# Patient Record
Sex: Female | Born: 1940 | Race: White | Hispanic: No | Marital: Married | Smoking: Current every day smoker
Health system: Southern US, Community
[De-identification: ages and names within clinical notes are randomized; demographics above are authoritative.]

## PROBLEM LIST (undated history)

## (undated) DIAGNOSIS — R51 Headache: Secondary | ICD-10-CM

## (undated) DIAGNOSIS — E079 Disorder of thyroid, unspecified: Secondary | ICD-10-CM

## (undated) DIAGNOSIS — F329 Major depressive disorder, single episode, unspecified: Secondary | ICD-10-CM

## (undated) DIAGNOSIS — J45909 Unspecified asthma, uncomplicated: Secondary | ICD-10-CM

## (undated) DIAGNOSIS — K5730 Diverticulosis of large intestine without perforation or abscess without bleeding: Secondary | ICD-10-CM

## (undated) DIAGNOSIS — T7840XA Allergy, unspecified, initial encounter: Secondary | ICD-10-CM

## (undated) DIAGNOSIS — H269 Unspecified cataract: Secondary | ICD-10-CM

## (undated) DIAGNOSIS — E119 Type 2 diabetes mellitus without complications: Secondary | ICD-10-CM

## (undated) DIAGNOSIS — I10 Essential (primary) hypertension: Secondary | ICD-10-CM

## (undated) DIAGNOSIS — F419 Anxiety disorder, unspecified: Secondary | ICD-10-CM

## (undated) DIAGNOSIS — F32A Depression, unspecified: Secondary | ICD-10-CM

## (undated) DIAGNOSIS — M1990 Unspecified osteoarthritis, unspecified site: Secondary | ICD-10-CM

## (undated) DIAGNOSIS — E785 Hyperlipidemia, unspecified: Secondary | ICD-10-CM

## (undated) DIAGNOSIS — I729 Aneurysm of unspecified site: Secondary | ICD-10-CM

## (undated) DIAGNOSIS — J449 Chronic obstructive pulmonary disease, unspecified: Secondary | ICD-10-CM

## (undated) DIAGNOSIS — K219 Gastro-esophageal reflux disease without esophagitis: Secondary | ICD-10-CM

## (undated) DIAGNOSIS — E669 Obesity, unspecified: Secondary | ICD-10-CM

## (undated) DIAGNOSIS — J4489 Other specified chronic obstructive pulmonary disease: Secondary | ICD-10-CM

## (undated) DIAGNOSIS — R197 Diarrhea, unspecified: Secondary | ICD-10-CM

## (undated) DIAGNOSIS — Z860100 Personal history of colon polyps, unspecified: Secondary | ICD-10-CM

## (undated) DIAGNOSIS — Z800 Family history of malignant neoplasm of digestive organs: Secondary | ICD-10-CM

## (undated) DIAGNOSIS — K922 Gastrointestinal hemorrhage, unspecified: Secondary | ICD-10-CM

## (undated) DIAGNOSIS — I639 Cerebral infarction, unspecified: Secondary | ICD-10-CM

## (undated) DIAGNOSIS — F17200 Nicotine dependence, unspecified, uncomplicated: Secondary | ICD-10-CM

## (undated) DIAGNOSIS — G709 Myoneural disorder, unspecified: Secondary | ICD-10-CM

## (undated) DIAGNOSIS — D689 Coagulation defect, unspecified: Secondary | ICD-10-CM

## (undated) DIAGNOSIS — K222 Esophageal obstruction: Secondary | ICD-10-CM

## (undated) DIAGNOSIS — D649 Anemia, unspecified: Secondary | ICD-10-CM

## (undated) HISTORY — DX: Diverticulosis of large intestine without perforation or abscess without bleeding: K57.30

## (undated) HISTORY — DX: Gastrointestinal hemorrhage, unspecified: K92.2

## (undated) HISTORY — DX: Major depressive disorder, single episode, unspecified: F32.9

## (undated) HISTORY — DX: Aneurysm of unspecified site: I72.9

## (undated) HISTORY — DX: Essential (primary) hypertension: I10

## (undated) HISTORY — DX: Allergy, unspecified, initial encounter: T78.40XA

## (undated) HISTORY — DX: Coagulation defect, unspecified: D68.9

## (undated) HISTORY — DX: Diarrhea, unspecified: R19.7

## (undated) HISTORY — PX: TONSILLECTOMY: SUR1361

## (undated) HISTORY — DX: Hyperlipidemia, unspecified: E78.5

## (undated) HISTORY — PX: BACK SURGERY: SHX140

## (undated) HISTORY — DX: Unspecified cataract: H26.9

## (undated) HISTORY — DX: Unspecified asthma, uncomplicated: J45.909

## (undated) HISTORY — PX: BRAIN SURGERY: SHX531

## (undated) HISTORY — DX: Personal history of colon polyps, unspecified: Z86.0100

## (undated) HISTORY — PX: TOTAL ABDOMINAL HYSTERECTOMY: SHX209

## (undated) HISTORY — DX: Personal history of colonic polyps: Z86.010

## (undated) HISTORY — DX: Family history of malignant neoplasm of digestive organs: Z80.0

## (undated) HISTORY — DX: Chronic obstructive pulmonary disease, unspecified: J44.9

## (undated) HISTORY — DX: Esophageal obstruction: K22.2

## (undated) HISTORY — DX: Other specified chronic obstructive pulmonary disease: J44.89

## (undated) HISTORY — DX: Cerebral infarction, unspecified: I63.9

## (undated) HISTORY — PX: LUMBAR DISC SURGERY: SHX700

## (undated) HISTORY — PX: CARDIAC CATHETERIZATION: SHX172

## (undated) HISTORY — DX: Disorder of thyroid, unspecified: E07.9

## (undated) HISTORY — DX: Myoneural disorder, unspecified: G70.9

## (undated) HISTORY — DX: Depression, unspecified: F32.A

## (undated) HISTORY — PX: APPENDECTOMY: SHX54

## (undated) HISTORY — DX: Unspecified osteoarthritis, unspecified site: M19.90

## (undated) HISTORY — DX: Obesity, unspecified: E66.9

## (undated) HISTORY — PX: KNEE ARTHROSCOPY: SHX127

## (undated) HISTORY — DX: Gastro-esophageal reflux disease without esophagitis: K21.9

## (undated) HISTORY — DX: Type 2 diabetes mellitus without complications: E11.9

---

## 1998-05-25 ENCOUNTER — Ambulatory Visit (HOSPITAL_COMMUNITY): Admission: RE | Admit: 1998-05-25 | Discharge: 1998-05-25 | Payer: Self-pay | Admitting: Orthopedic Surgery

## 2000-08-25 ENCOUNTER — Ambulatory Visit (HOSPITAL_BASED_OUTPATIENT_CLINIC_OR_DEPARTMENT_OTHER): Admission: RE | Admit: 2000-08-25 | Discharge: 2000-08-25 | Payer: Self-pay | Admitting: Orthopedic Surgery

## 2001-11-17 ENCOUNTER — Encounter (INDEPENDENT_AMBULATORY_CARE_PROVIDER_SITE_OTHER): Payer: Self-pay | Admitting: Specialist

## 2001-11-17 ENCOUNTER — Encounter: Payer: Self-pay | Admitting: Gastroenterology

## 2001-11-17 ENCOUNTER — Ambulatory Visit (HOSPITAL_COMMUNITY): Admission: RE | Admit: 2001-11-17 | Discharge: 2001-11-17 | Payer: Self-pay | Admitting: Gastroenterology

## 2002-06-20 ENCOUNTER — Observation Stay (HOSPITAL_COMMUNITY): Admission: RE | Admit: 2002-06-20 | Discharge: 2002-06-21 | Payer: Self-pay | Admitting: Neurosurgery

## 2002-06-20 ENCOUNTER — Encounter: Payer: Self-pay | Admitting: Neurosurgery

## 2004-09-08 HISTORY — PX: CARDIAC CATHETERIZATION: SHX172

## 2004-12-23 ENCOUNTER — Ambulatory Visit: Payer: Self-pay | Admitting: Cardiology

## 2004-12-26 ENCOUNTER — Inpatient Hospital Stay (HOSPITAL_BASED_OUTPATIENT_CLINIC_OR_DEPARTMENT_OTHER): Admission: RE | Admit: 2004-12-26 | Discharge: 2004-12-26 | Payer: Self-pay | Admitting: Cardiology

## 2004-12-26 ENCOUNTER — Ambulatory Visit: Payer: Self-pay | Admitting: Cardiology

## 2005-01-02 ENCOUNTER — Ambulatory Visit: Payer: Self-pay | Admitting: Cardiology

## 2005-01-23 ENCOUNTER — Ambulatory Visit: Payer: Self-pay | Admitting: Cardiology

## 2005-11-17 ENCOUNTER — Ambulatory Visit: Payer: Self-pay | Admitting: Gastroenterology

## 2005-11-28 ENCOUNTER — Encounter: Payer: Self-pay | Admitting: Gastroenterology

## 2005-12-01 ENCOUNTER — Ambulatory Visit: Payer: Self-pay | Admitting: Gastroenterology

## 2006-03-31 ENCOUNTER — Ambulatory Visit (HOSPITAL_COMMUNITY): Admission: RE | Admit: 2006-03-31 | Discharge: 2006-03-31 | Payer: Self-pay | Admitting: Family Medicine

## 2006-04-19 ENCOUNTER — Encounter: Admission: RE | Admit: 2006-04-19 | Discharge: 2006-04-19 | Payer: Self-pay | Admitting: Orthopedic Surgery

## 2008-08-25 ENCOUNTER — Encounter: Payer: Self-pay | Admitting: Endocrinology

## 2008-08-28 DIAGNOSIS — I10 Essential (primary) hypertension: Secondary | ICD-10-CM

## 2008-08-28 DIAGNOSIS — J45909 Unspecified asthma, uncomplicated: Secondary | ICD-10-CM | POA: Insufficient documentation

## 2008-08-28 DIAGNOSIS — K449 Diaphragmatic hernia without obstruction or gangrene: Secondary | ICD-10-CM | POA: Insufficient documentation

## 2008-08-28 DIAGNOSIS — K222 Esophageal obstruction: Secondary | ICD-10-CM | POA: Insufficient documentation

## 2008-08-28 DIAGNOSIS — K5730 Diverticulosis of large intestine without perforation or abscess without bleeding: Secondary | ICD-10-CM | POA: Insufficient documentation

## 2008-08-28 DIAGNOSIS — E1159 Type 2 diabetes mellitus with other circulatory complications: Secondary | ICD-10-CM | POA: Insufficient documentation

## 2008-08-28 DIAGNOSIS — Z860100 Personal history of colon polyps, unspecified: Secondary | ICD-10-CM | POA: Insufficient documentation

## 2008-08-28 DIAGNOSIS — K219 Gastro-esophageal reflux disease without esophagitis: Secondary | ICD-10-CM | POA: Insufficient documentation

## 2008-08-29 ENCOUNTER — Ambulatory Visit: Payer: Self-pay | Admitting: Gastroenterology

## 2008-08-29 DIAGNOSIS — R197 Diarrhea, unspecified: Secondary | ICD-10-CM | POA: Insufficient documentation

## 2008-08-29 DIAGNOSIS — E669 Obesity, unspecified: Secondary | ICD-10-CM | POA: Insufficient documentation

## 2008-08-29 DIAGNOSIS — J449 Chronic obstructive pulmonary disease, unspecified: Secondary | ICD-10-CM | POA: Insufficient documentation

## 2008-08-29 DIAGNOSIS — E785 Hyperlipidemia, unspecified: Secondary | ICD-10-CM | POA: Insufficient documentation

## 2008-08-29 DIAGNOSIS — E119 Type 2 diabetes mellitus without complications: Secondary | ICD-10-CM | POA: Insufficient documentation

## 2008-08-29 DIAGNOSIS — R634 Abnormal weight loss: Secondary | ICD-10-CM | POA: Insufficient documentation

## 2008-08-29 LAB — CONVERTED CEMR LAB
ALT: 20 units/L (ref 0–35)
AST: 21 units/L (ref 0–37)
Albumin: 3.9 g/dL (ref 3.5–5.2)
Alkaline Phosphatase: 74 units/L (ref 39–117)
Amylase: 23 units/L — ABNORMAL LOW (ref 27–131)
BUN: 5 mg/dL — ABNORMAL LOW (ref 6–23)
Basophils Absolute: 0.1 10*3/uL (ref 0.0–0.1)
Basophils Relative: 0.9 % (ref 0.0–3.0)
Bilirubin, Direct: 0.1 mg/dL (ref 0.0–0.3)
CO2: 27 meq/L (ref 19–32)
Calcium: 9.9 mg/dL (ref 8.4–10.5)
Chloride: 104 meq/L (ref 96–112)
Creatinine, Ser: 0.7 mg/dL (ref 0.4–1.2)
Eosinophils Absolute: 0.2 10*3/uL (ref 0.0–0.7)
Eosinophils Relative: 2.5 % (ref 0.0–5.0)
GFR calc Af Amer: 108 mL/min
GFR calc non Af Amer: 89 mL/min
Glucose, Bld: 97 mg/dL (ref 70–99)
HCT: 42.1 % (ref 36.0–46.0)
Hemoglobin: 14.4 g/dL (ref 12.0–15.0)
Lipase: 19 units/L (ref 11.0–59.0)
Lymphocytes Relative: 28.9 % (ref 12.0–46.0)
MCHC: 34.2 g/dL (ref 30.0–36.0)
MCV: 87.3 fL (ref 78.0–100.0)
Monocytes Absolute: 0.6 10*3/uL (ref 0.1–1.0)
Monocytes Relative: 10 % (ref 3.0–12.0)
Neutro Abs: 3.4 10*3/uL (ref 1.4–7.7)
Neutrophils Relative %: 57.7 % (ref 43.0–77.0)
Platelets: 267 10*3/uL (ref 150–400)
Potassium: 3.8 meq/L (ref 3.5–5.1)
RBC: 4.82 M/uL (ref 3.87–5.11)
RDW: 12.4 % (ref 11.5–14.6)
Sed Rate: 17 mm/hr (ref 0–22)
Sodium: 139 meq/L (ref 135–145)
TSH: 0.02 microintl units/mL — ABNORMAL LOW (ref 0.35–5.50)
Tissue Transglutaminase Ab, IgA: 0 units (ref <7)
Total Bilirubin: 0.7 mg/dL (ref 0.3–1.2)
Total Protein: 6.7 g/dL (ref 6.0–8.3)
WBC: 6.1 10*3/uL (ref 4.5–10.5)

## 2008-08-30 LAB — CONVERTED CEMR LAB
ALT: 20 units/L (ref 0–35)
AST: 21 units/L (ref 0–37)
Albumin: 3.9 g/dL (ref 3.5–5.2)
Alkaline Phosphatase: 74 units/L (ref 39–117)
Amylase: 23 units/L — ABNORMAL LOW (ref 27–131)
BUN: 5 mg/dL — ABNORMAL LOW (ref 6–23)
Basophils Absolute: 0.1 10*3/uL (ref 0.0–0.1)
Basophils Relative: 0.9 % (ref 0.0–3.0)
Bilirubin, Direct: 0.1 mg/dL (ref 0.0–0.3)
CO2: 27 meq/L (ref 19–32)
Calcium: 9.9 mg/dL (ref 8.4–10.5)
Chloride: 104 meq/L (ref 96–112)
Creatinine, Ser: 0.7 mg/dL (ref 0.4–1.2)
Eosinophils Absolute: 0.2 10*3/uL (ref 0.0–0.7)
Eosinophils Relative: 2.5 % (ref 0.0–5.0)
GFR calc Af Amer: 108 mL/min
GFR calc non Af Amer: 89 mL/min
Glucose, Bld: 97 mg/dL (ref 70–99)
HCT: 42.1 % (ref 36.0–46.0)
Hemoglobin: 14.4 g/dL (ref 12.0–15.0)
Lipase: 19 units/L (ref 11.0–59.0)
Lymphocytes Relative: 28.9 % (ref 12.0–46.0)
MCHC: 34.2 g/dL (ref 30.0–36.0)
MCV: 87.3 fL (ref 78.0–100.0)
Monocytes Absolute: 0.6 10*3/uL (ref 0.1–1.0)
Monocytes Relative: 10 % (ref 3.0–12.0)
Neutro Abs: 3.4 10*3/uL (ref 1.4–7.7)
Neutrophils Relative %: 57.7 % (ref 43.0–77.0)
Platelets: 267 10*3/uL (ref 150–400)
Potassium: 3.8 meq/L (ref 3.5–5.1)
RBC: 4.82 M/uL (ref 3.87–5.11)
RDW: 12.4 % (ref 11.5–14.6)
Sed Rate: 17 mm/hr (ref 0–22)
Sodium: 139 meq/L (ref 135–145)
T3 Uptake Ratio: 48.7 % — ABNORMAL HIGH (ref 22.5–37.0)
T3, Free: 5.5 pg/mL — ABNORMAL HIGH (ref 2.3–4.2)
T4, Total: 18 ug/dL — ABNORMAL HIGH (ref 5.0–12.5)
TSH: 0.02 microintl units/mL — ABNORMAL LOW (ref 0.35–5.50)
Total Bilirubin: 0.7 mg/dL (ref 0.3–1.2)
Total Protein: 6.7 g/dL (ref 6.0–8.3)
WBC: 6.1 10*3/uL (ref 4.5–10.5)

## 2008-09-20 ENCOUNTER — Encounter: Payer: Self-pay | Admitting: Gastroenterology

## 2008-09-20 ENCOUNTER — Ambulatory Visit: Payer: Self-pay | Admitting: Gastroenterology

## 2008-09-22 ENCOUNTER — Encounter: Payer: Self-pay | Admitting: Gastroenterology

## 2008-10-12 ENCOUNTER — Ambulatory Visit: Payer: Self-pay | Admitting: Gastroenterology

## 2008-10-12 DIAGNOSIS — E079 Disorder of thyroid, unspecified: Secondary | ICD-10-CM | POA: Insufficient documentation

## 2008-10-26 ENCOUNTER — Ambulatory Visit: Payer: Self-pay | Admitting: Endocrinology

## 2008-10-26 LAB — CONVERTED CEMR LAB
Free T4: 1.2 ng/dL (ref 0.6–1.6)
TSH: 0.05 microintl units/mL — ABNORMAL LOW (ref 0.35–5.50)

## 2008-10-30 ENCOUNTER — Encounter: Payer: Self-pay | Admitting: Endocrinology

## 2008-11-07 ENCOUNTER — Encounter (HOSPITAL_COMMUNITY): Admission: RE | Admit: 2008-11-07 | Discharge: 2009-01-12 | Payer: Self-pay | Admitting: Gastroenterology

## 2008-11-08 ENCOUNTER — Encounter: Payer: Self-pay | Admitting: Endocrinology

## 2008-12-12 ENCOUNTER — Encounter: Payer: Self-pay | Admitting: Endocrinology

## 2008-12-21 ENCOUNTER — Ambulatory Visit: Payer: Self-pay | Admitting: Endocrinology

## 2008-12-27 ENCOUNTER — Encounter (HOSPITAL_COMMUNITY): Admission: RE | Admit: 2008-12-27 | Discharge: 2009-01-26 | Payer: Self-pay | Admitting: Endocrinology

## 2009-01-30 ENCOUNTER — Telehealth (INDEPENDENT_AMBULATORY_CARE_PROVIDER_SITE_OTHER): Payer: Self-pay | Admitting: *Deleted

## 2009-04-29 ENCOUNTER — Emergency Department (HOSPITAL_COMMUNITY): Admission: EM | Admit: 2009-04-29 | Discharge: 2009-04-29 | Payer: Self-pay | Admitting: Emergency Medicine

## 2010-04-11 ENCOUNTER — Encounter: Admission: RE | Admit: 2010-04-11 | Discharge: 2010-04-11 | Payer: Self-pay | Admitting: Family Medicine

## 2010-09-29 ENCOUNTER — Encounter: Payer: Self-pay | Admitting: Family Medicine

## 2010-09-29 ENCOUNTER — Encounter: Payer: Self-pay | Admitting: Endocrinology

## 2010-09-30 ENCOUNTER — Encounter: Payer: Self-pay | Admitting: Endocrinology

## 2010-10-08 NOTE — Letter (Signed)
Summary: Patient Notice- Colon Biospy Results  [facility]  [address] [place], [place] [address]   Phone: [phone]  Fax: [phone]        September 22, 2008 MRN: [id number]    [facility] [address] [place], [place]  [address]    Dear [name],  I am pleased to inform you that the biopsies taken during your recent colonoscopy did not show any evidence of cancer upon pathologic examination.  Additional information/recommendations:  __No further action is needed at this time.  Please follow-up with      your primary care physician for your other healthcare needs.  XX__Please call [phone] to schedule a return visit to review      your condition.  __Continue with the treatment plan as outlined on the day of your      exam.  __You should have a repeat colonoscopy examination for this problem           in _ years.  Please call us if you are having persistent problems or have questions about your condition that have not been fully answered at this time.  Sincerely,  Elizabeth Layman MD [facility]  This letter has been electronically signed by your physician.

## 2010-10-08 NOTE — Assessment & Plan Note (Signed)
Summary: FU  $50   STC   Vital Signs:  Patient profile:   70 year old female Height:      62 inches Weight:      168 pounds Temp:     96.8 degrees F oral Pulse rate:   93 / minute BP standing:   148 / 78  (right arm) Cuff size:   regular  Vitals Entered By: Bill Salinas CMA (December 21, 2008 8:01 AM)  Referring Provider:  DR Christell Constant Primary Provider:  Rudi Heap, MD   History of Present Illness: pt has persistent hyperthyroidism with decreased uptake on nuc med study.  pt denies recent iodine contrast.  she feels well except for a 3-lb weight gain.  Current Medications (verified): 1)  Janumet 50-1000 Mg Tabs (Sitagliptin-Metformin Hcl) .Marland Kitchen.. 1 Tablet By Mouth Once Daily 2)  Ditropan Xl 15 Mg Xr24h-Tab (Oxybutynin Chloride) .Marland Kitchen.. 1 Tablet By Mouth Once Daily 3)  Amlodipine Besylate 5 Mg Tabs (Amlodipine Besylate) .Marland Kitchen.. 1 Tablet By Mouth Once Daily 4)  Ramipril 10 Mg Caps (Ramipril) .Marland Kitchen.. 1 Tablet By Mouth Once Daily 5)  Urelle 81 Mg Tabs (Meth-Hyo-M Bl-Na Phos-Ph Sal) .Marland Kitchen.. 1 Tablet By Mouth 4 Times Per Day 6)  Omeprazole 20 Mg Cpdr (Omeprazole) .Marland Kitchen.. 1 Tablet By Mouth Once Daily 7)  Lexapro 10 Mg Tabs (Escitalopram Oxalate) .Marland Kitchen.. 1 Tablet By Mouth Once Daily 8)  Crestor 10 Mg Tabs (Rosuvastatin Calcium) .Marland Kitchen.. 1 Tablet By Mouth Once Daily 9)  Xanax 0.25 Mg Tabs (Alprazolam) .Marland Kitchen.. 1 Tablet By Mouth Once Daily 10)  Singulair 10 Mg Tabs (Montelukast Sodium) .Marland Kitchen.. 1 Tablet By Mouth Once Daily 11)  Benadryl Dye-Free Allergy 25 Mg Caps (Diphenhydramine Hcl) .... As Needed 12)  Klor-Con M20 20 Meq Cr-Tabs (Potassium Chloride Crys Cr) .... Take 1 By Mouth Qd  Allergies (verified): 1)  ! Codeine 2)  ! Penicillin 3)  ! Sulfa 4)  ! * Latex  Past History:  Past Medical History:    DIARRHEA (ICD-787.91)    OBESITY, UNSPECIFIED (ICD-278.00)    HYPERLIPIDEMIA (ICD-272.4)    DM (ICD-250.00)    COPD (ICD-496)    ASTHMA (ICD-493.90)    HYPERTENSION (ICD-401.9)    CARCINOMA, COLON, FAMILY HX  (ICD-V16.0)    GERD (ICD-530.81)    HIATAL HERNIA (ICD-553.3)    ESOPHAGEAL STRICTURE (ICD-530.3)    DIVERTICULOSIS, COLON (ICD-562.10)    COLONIC POLYPS, HX OF (ICD-V12.72)      (10/26/2008)  Review of Systems  The patient denies fever.    Physical Exam  General:  obese.  no distress Neck:  thyroid is enlarged, right > left, with ? of multiple nodules Additional Exam:  outside test results are reviewed:  12/12/08: tsh=undetectable fti=3.8  THYROID SCAN AND UPTAKE - 24 HOURS Comparison: 11/08/2008 No focal thyroid abnormalities. Decreased 24 hour radioiodine uptake of 2.4%. With the patient demonstrating hyperthyroidism, this most likely represents subacute thyroiditis. 24 hour uptake has decreased slightly since the prior study, when it measured 3.3%.   Impression & Recommendations:  Problem # 1:  HYPERTHYROIDISM (ICD-242.90) with persistently low uptake on nuc med study  Other Orders: Radiology Referral (Radiology) Est. Patient Level III (32440)  Patient Instructions: 1)  i advised pt to come in for repeat tsh, free t4, and thyroglobulin (10272),  all 242.9

## 2010-10-08 NOTE — Assessment & Plan Note (Signed)
Summary: pain after meals and diarrhea././em   History of Present Illness Visit Type: new patient Primary GI MD: Sheryn Bison MD FACP FAGA Primary Provider: Rudi Heap, MD Chief Complaint: GERD, diarrhea History of Present Illness:   Elizabeth [name] has had 6 months of explosive diarrhea managed personally by taking Imodium after severe diarrhea and soiling clothing. She denies any dietary changes but has lost 70 pounds because she was diagnosed with diabetes and eats very little during the day.She Denies use of sorbitol or fructose or any history of lactose intolerance. She'll occasionally have some lower abdominal cramping after a bowel movement but denies significant abdominal pain otherwise. She denies any specific food intolerances.  6 months ago she was diagnosed with diabetes and well self imposed diet has lost 70 pounds. She has a previous history of idiopathic gastroparesis has been on Reglan 10 mg 4 times a day which she dropped back to twice a day after she read the [facility] Box warning on Reglan from the FDA. Patient does work in [employer] office.  It seems to patient that the more she eats more stools she has. She describes her stools as floating and a large volume but without heme. She does not have nocturnal diarrhea or a systemic complaints such as fever, chills, skin rashes, or arthritis. She denies use of NSAIDs or other anti-inflammatories. She denies any upper GI complaints or hepatobiliary problems. She does have well water but there no sick family members at home. In reviewing her chart she has not had this problem in the past. Her family is of [place] descent. She does have a family history of colon cancer and her last colonoscopy was 2 years ago. She takes daily omeprazole 20 mg for acid reflux. She is on oral diabetic medications but not insulin.   GI Review of Systems   Weight loss of 70 pounds   Denies abdominal pain, acid reflux, belching, bloating, chest pain, dysphagia  with liquids, dysphagia with solids, heartburn, loss of appetite, nausea, vomiting, vomiting blood, weight loss, and  weight gain.      Reports diarrhea and  fecal incontinence.     Denies anal fissure, black tarry stools, change in bowel habit, constipation, diverticulosis, heme positive stool, hemorrhoids, irritable bowel syndrome, jaundice, light color stool, liver problems, rectal bleeding, and  rectal pain.     Prior Medications Reviewed Using: Patient Recall  Updated Prior Medication List: JANUMET 50-1000 MG TABS (SITAGLIPTIN-METFORMIN HCL) 1 tablet by mouth once daily DITROPAN XL 15 MG XR24H-TAB (OXYBUTYNIN CHLORIDE) 1 tablet by mouth once daily AMLODIPINE BESYLATE 5 MG TABS (AMLODIPINE BESYLATE) 1 tablet by mouth once daily RAMIPRIL 10 MG CAPS (RAMIPRIL) 1 tablet by mouth once daily URELLE 81 MG TABS (METH-HYO-M BL-NA PHOS-PH SAL) 1 tablet by mouth 4 times per day REGLAN 10 MG TABS (METOCLOPRAMIDE HCL) 1 tablet by mouth two times a day OMEPRAZOLE 20 MG CPDR (OMEPRAZOLE) 1 tablet by mouth once daily LEXAPRO 10 MG TABS (ESCITALOPRAM OXALATE) 1 tablet by mouth once daily CRESTOR 10 MG TABS (ROSUVASTATIN CALCIUM) 1 tablet by mouth once daily XANAX 0.25 MG TABS (ALPRAZOLAM) 1 tablet by mouth once daily SINGULAIR 10 MG TABS (MONTELUKAST SODIUM) 1 tablet by mouth once daily BENADRYL DYE-FREE ALLERGY 25 MG CAPS (DIPHENHYDRAMINE HCL) as needed  Current Allergies (reviewed today): ! CODEINE ! PENICILLIN ! SULFA ! * LATEX  Past Medical History:    Reviewed history from 08/28/2008 and no changes required:       Current Problems:  DIARRHEA (ICD-787.91)       OBESITY, UNSPECIFIED (ICD-278.00)       HYPERLIPIDEMIA (ICD-272.4)       DM (ICD-250.00)       COPD (ICD-496)       ASTHMA (ICD-493.90)       HYPERTENSION (ICD-401.9)       CARCINOMA, COLON, FAMILY HX (ICD-V16.0)       GERD (ICD-530.81)       HIATAL HERNIA (ICD-553.3)       ESOPHAGEAL STRICTURE (ICD-530.3)        DIVERTICULOSIS, COLON (ICD-562.10)       COLONIC POLYPS, HX OF (ICD-V12.72)          Past Surgical History:    Reviewed history from 08/28/2008 and no changes required:       Hysterectomy complete       Tonsillectomy       Lumbar surgery       Left Arthoscopy knee       Appendectomy   Family History:    Reviewed history from 08/28/2008 and no changes required:       Family History of Colon Cancer:first cousin and paternal aunt and uncle       Family History of Gastric Cancer: Paternal Grandmother       Family History of Breast Cancer: Maternal Aunt, several paternal cousins       Family History of Diabetes: Mother       Family History of Heart Disease: Mother, maternal grandmother       Thyroid cancer: Aunt  Social History:    Reviewed history and no changes required:       Married       Occupation: RN       Patient currently smokes.        Alcohol Use - no       Daily Caffeine Use       Illicit Drug Use - no       Patient gets regular exercise.   Risk Factors:  Tobacco use:  current    Year started:  1984    Cigarettes:  Yes Drug use:  no Caffeine use:  2 drinks per day Alcohol use:  no Exercise:  yes    Type:  Walk   Review of Systems       The patient complains of allergy/sinus and depression-new.  The patient denies anemia, anxiety-new, arthritis/joint pain, back pain, blood in urine, breast changes/lumps, change in vision, confusion, cough, coughing up blood, fainting, fatigue, fever, headaches-new, hearing problems, heart murmur, heart rhythm changes, itching, menstrual pain, muscle pains/cramps, night sweats, nosebleeds, pregnancy symptoms, shortness of breath, skin rash, sleeping problems, sore throat, swelling of feet/legs, swollen lymph glands, thirst - excessive , urination - excessive , urination changes/pain, urine leakage, vision changes, and voice change.     Vital Signs:  Patient Profile:   70 Years Old Female Height:     62 inches Weight:       171.50 pounds BMI:     31.48 BSA:     1.79 Pulse rate:   96 / minute Pulse rhythm:   regular BP sitting:   164 / 68  (left arm) Cuff size:   regular  Vitals Entered By: Teresita Madura CMA (August 29, 2008 9:01 AM)                  Physical Exam  General:     Well developed, well nourished, no acute distress.healthy appearing.  Head:     Normocephalic and atraumatic. Eyes:     PERRLA, no icterus.exam deferred to patient's ophthalmologist.   Neck:     Supple; no masses or thyromegaly. Lungs:     Clear throughout to auscultation. Heart:     Regular rate and rhythm; no murmurs, rubs,  or bruits. Abdomen:     Soft, nontender and nondistended. No masses, hepatosplenomegaly or hernias noted. Normal bowel sounds. Rectal:     Normal exam.hemocult negative.   Extremities:     No clubbing, cyanosis, edema or deformities noted. Neurologic:     Alert and  oriented x4;  grossly normal neurologically. Psych:     Alert and cooperative. Normal mood and affect.    Impression & Recommendations:  Problem # 1:  DIARRHEA (ICD-787.91) Assessment: Deteriorated This is a new problem for this patient it does sound like she has possible malabsorption. Have ordered screening laboratory parameters and stool exams. Have also set her for followup colonoscopy and obtain random biopsies to exclude microscopic-collagenous colitis. Her diabetes is under good control with her last hemoglobin A1c at 5.1. She has no symptoms of peripheral neuropathy, renal disease, or visual problems related to her diabetes. That would make diabetic gut diarrhea problems unlikely. She is on Reglan which have asked her to stop it she will continue omeprazole. It certainly does not sound like she is having gastroparesis problems at this time since she is following such a limited diet. She denies anorexia but I wonder if this is not also an underlying problem and may need further evaluation also. Orders: TLB-CBC  Platelet - w/Differential (85025-CBCD) TLB-BMP (Basic Metabolic Panel-BMET) (80048-METABOL) TLB-Hepatic/Liver Function Pnl (80076-HEPATIC) TLB-TSH (Thyroid Stimulating Hormone) (84443-TSH) TLB-Amylase (82150-AMYL) TLB-Lipase (83690-LIPASE) TLB-Sedimentation Rate (ESR) (85652-ESR) T-Sprue Panel (Celiac Disease Aby Eval) [phone]) T-Stool Fats [place] Stain [phone]) T-Stool Giardia / Crypto- EIA (56213) T-Stool for O&P (08657-84696) T-Fecal WBC (29528-41324) Colonoscopy (Colon)   Problem # 2:  OBESITY, UNSPECIFIED (ICD-278.00) Assessment: Improved  Problem # 3:  DM (ICD-250.00) Assessment: Improved continue current medications per Dr. Christell Constant  Problem # 4:  HYPERTENSION (ICD-401.9) Assessment: Improved  Problem # 5:  CARCINOMA, COLON, FAMILY HX (ICD-V16.0) Assessment: Unchanged colonoscopy followup scheduled.  Problem # 6:  GERD (ICD-530.81) Assessment: Improved continue daily omeprazole 20 mg   Patient Instructions: 1)  Copy Sent To:Dr. Rudi Heap in [facility] 2)  Please Continue current medications. 3)  Labs and stool exams ordered 4)  Colonoscopy scheduled with random colon biopsies, scheduled for 09-20-2008.  5)  Stop Reglan therapy. 6)  Colonoscopy and Flexible Sigmoidoscopy brochure given. 7)  Conscious Sedation brochure given. 8)  [facility] Patient Information Guide given to patient.    Prescriptions: MOVIPREP 100 GM  SOLR (PEG-KCL-NACL-NASULF-NA ASC-C) As per prep instructions.  #1 x 0   Entered by:   Harlow Mares CMA   Authorized by:   Mardella Layman MD FACG,FAGA   Signed by:   Harlow Mares CMA on 08/29/2008   Method used:   Electronically to        [facility] # [phone]* (retail)       [address]       [place], [place]  [address]       Ph: [phone] or [phone]       Fax: [phone]   RxID:   [id number]  ]

## 2010-10-08 NOTE — Assessment & Plan Note (Signed)
Summary: NEW ENDO-SEVERE HYPERTHYROIDISM $50 PER TRACY/DR MOORE-[facility]ICA...   Vital Signs:  Patient Profile:   70 Years Old Female Height:     62 inches Weight:      165.0 pounds O2 Sat:      96 % O2 treatment:    Room Air Temp:     98.3 degrees F oral Pulse rate:   93 / minute BP sitting:   160 / 72  (left arm) Cuff size:   regular  Pt. in pain?   no  Vitals Entered By: Orlan Leavens (October 26, 2008 9:59 AM)                  Referred by:  DR Christell Constant PCP:  Rudi Heap, MD  Chief Complaint:  NEW ENDO.  History of Present Illness: 6 mos of diarrhea.  eval of this found hyperthyroidism. she says she was never noted before to have a thyroid condition.  she has no associated abd pain.  diarrhea is resolved, but has lost 85 lbs x 14 mos    Prior Medications Reviewed Using: List Brought by Patient  Updated Prior Medication List: JANUMET 50-1000 MG TABS (SITAGLIPTIN-METFORMIN HCL) 1 tablet by mouth once daily DITROPAN XL 15 MG XR24H-TAB (OXYBUTYNIN CHLORIDE) 1 tablet by mouth once daily AMLODIPINE BESYLATE 5 MG TABS (AMLODIPINE BESYLATE) 1 tablet by mouth once daily RAMIPRIL 10 MG CAPS (RAMIPRIL) 1 tablet by mouth once daily URELLE 81 MG TABS (METH-HYO-M BL-NA PHOS-PH SAL) 1 tablet by mouth 4 times per day OMEPRAZOLE 20 MG CPDR (OMEPRAZOLE) 1 tablet by mouth once daily LEXAPRO 10 MG TABS (ESCITALOPRAM OXALATE) 1 tablet by mouth once daily CRESTOR 10 MG TABS (ROSUVASTATIN CALCIUM) 1 tablet by mouth once daily XANAX 0.25 MG TABS (ALPRAZOLAM) 1 tablet by mouth once daily SINGULAIR 10 MG TABS (MONTELUKAST SODIUM) 1 tablet by mouth once daily BENADRYL DYE-FREE ALLERGY 25 MG CAPS (DIPHENHYDRAMINE HCL) as needed KLOR-CON M20 20 MEQ CR-TABS (POTASSIUM CHLORIDE CRYS CR) TAKE 1 by mouth QD  Current Allergies (reviewed today): ! CODEINE ! PENICILLIN ! SULFA ! * LATEX  Past Medical History:    Reviewed history from 08/29/2008 and no changes required:       DIARRHEA (ICD-787.91)     OBESITY, UNSPECIFIED (ICD-278.00)       HYPERLIPIDEMIA (ICD-272.4)       DM (ICD-250.00)       COPD (ICD-496)       ASTHMA (ICD-493.90)       HYPERTENSION (ICD-401.9)       CARCINOMA, COLON, FAMILY HX (ICD-V16.0)       GERD (ICD-530.81)       HIATAL HERNIA (ICD-553.3)       ESOPHAGEAL STRICTURE (ICD-530.3)       DIVERTICULOSIS, COLON (ICD-562.10)       COLONIC POLYPS, HX OF (ICD-V12.72)           Family History:    Reviewed history from 08/29/2008 and no changes required:       Family History of Colon Cancer:first cousin and paternal aunt and uncle       Family History of Gastric Cancer: Paternal Grandmother       Family History of Breast Cancer: Maternal Aunt, several paternal cousins       Family History of Diabetes: Mother       Family History of Heart Disease: Mother, maternal grandmother       Thyroid cancer: Aunt       sister has uncertain typr of thyroid problem (  not cancer)  Social History:    Reviewed history from 10/12/2008 and no changes required:       Married       Occupation: Charity fundraiser, [employer]       Patient currently smokes.        Alcohol Use - no       Daily Caffeine Use       Illicit Drug Use - no       Patient gets regular exercise.    Review of Systems  The patient denies fever and syncope.         denies double vision, headache, palpitations, sob, brbpr, myalgias, excessive diaphoresis, numbness, tremor, anxiety, hypoglycemia, bruising.  she attributes rhinorrhea to current uri.  she has urinary incontinence.    Physical Exam  General:     obese.   Head:     head: no deformity eyes: no periorbital swelling, no proptosis external nose and ears are normal mouth: no lesion seen  Neck:     thyroid is enlarged, right > left, with ? of multiple nodules Lungs:     clear bilaterally to A  Heart:     regular rate and rhythm, S1, S2 without murmurs, rubs, gallops, or clicks Abdomen:     abdomen is soft, nontender.  no hepatosplenomegaly.    not distended.  no hernia  Msk:     no deformity or scoliosis noted with normal posture and gait Pulses:     dorsalis pedis intact bilat.  no carotid bruit  Extremities:     no deformity.  no ulcer on the feet.  feet are of normal color and temp.  no edema  Neurologic:     cn 2-12 grossly intact.   readily moves all 4's.   sensation is intact to touch on the feet no tremor Skin:     normal texture and temp.  no rash.  not diaphoretic  Cervical Nodes:     no significant adenopathy Psych:     alert and cooperative; normal mood and affect; normal attention span and concentration Additional Exam:     today; FREE T4                   1.2 ng/dL                   1.6-1.0 FastTSH              [L]  0.05 uIU/mL     Impression & Recommendations:  Problem # 1:  HYPERTHYROIDISM (ICD-242.90) could be due to grave's disease, multinodular goiter, or a combination of the 2  Problem # 2:  WEIGHT LOSS (ICD-783.21) severe, prob due to #1  Problem # 3:  ASTHMA (ICD-493.90) this increases the risk of metoprolol  Medications Added to Medication List This Visit: 1)  Klor-con M20 20 Meq Cr-tabs (Potassium chloride crys cr) .... Take 1 by mouth qd   Patient Instructions: 1)  cc dr Annabell Howells, [place], [facility] 2)  we discussed the differential diagnosis, risks, and rx options of hyperthyroidism 3)  scan, with plan for i-131 rx 4)  lopressor is declined 5)  ret 6 weeks after i-131 rx

## 2010-10-08 NOTE — Progress Notes (Signed)
Summary: labs  Phone Note Call from Patient Call back at Home Phone [phone]   Caller: [phone] Call For: dr ellsion  Summary of Call:  per [facility] on 12-21-2008 was told to come back and have a  tsh, free t4, and thyroglobulin (56213),  all 242.9...Marland KitchenMarland KitchenConnelly Spruell states her car broke down  want to know if she can do this at Dr Christell Constant office in [place]  Initial call taken by: Shelbie Proctor,  Jan 30, 2009 4:07 PM  Follow-up for Phone Call        ok if it is ok with him Follow-up by: Minus Breeding MD,  Jan 30, 2009 4:34 PM  Additional Follow-up for Phone Call Additional follow up Details #1::        called pt to inform cheryl enter labs in the system Additional Follow-up by: Shelbie Proctor,  Jan 30, 2009 4:42 PM

## 2010-10-08 NOTE — Assessment & Plan Note (Signed)
Summary: RECEIVED A LETTER TO F-UP/YF   History of Present Illness Visit Type: follow up Primary GI MD: Sheryn Bison MD FACP FAGA Primary Provider: Rudi Heap, MD Requesting Provider: n/a Chief Complaint: follow-up visit from Colonoscopy on 1/13 History of Present Illness:   Elizabeth [name] is doing well and  is no longer having diarrhea since we have stopped her Reglan. Her colonoscopy with multiple colon biopsies did not show microscopic or collagenous colitis. She was found be hyperthyroidism and has an appointment with Dr. Everardo All next week. She really is doing extremely well denies GI complaints this time. She continues to involuntarily lose weight which I think is related to her thyrotoxicosis.   GI Review of Systems      Denies abdominal pain, acid reflux, belching, bloating, chest pain, dysphagia with liquids, dysphagia with solids, heartburn, loss of appetite, nausea, vomiting, vomiting blood, weight loss, and  weight gain.        Denies anal fissure, black tarry stools, change in bowel habit, constipation, diarrhea, diverticulosis, fecal incontinence, heme positive stool, hemorrhoids, irritable bowel syndrome, jaundice, light color stool, liver problems, rectal bleeding, and  rectal pain.   Prior Medications Reviewed Using: Patient Recall  Prior Medication List:  JANUMET 50-1000 MG TABS (SITAGLIPTIN-METFORMIN HCL) 1 tablet by mouth once daily DITROPAN XL 15 MG XR24H-TAB (OXYBUTYNIN CHLORIDE) 1 tablet by mouth once daily AMLODIPINE BESYLATE 5 MG TABS (AMLODIPINE BESYLATE) 1 tablet by mouth once daily RAMIPRIL 10 MG CAPS (RAMIPRIL) 1 tablet by mouth once daily URELLE 81 MG TABS (METH-HYO-M BL-NA PHOS-PH SAL) 1 tablet by mouth 4 times per day OMEPRAZOLE 20 MG CPDR (OMEPRAZOLE) 1 tablet by mouth once daily LEXAPRO 10 MG TABS (ESCITALOPRAM OXALATE) 1 tablet by mouth once daily CRESTOR 10 MG TABS (ROSUVASTATIN CALCIUM) 1 tablet by mouth once daily XANAX 0.25 MG TABS (ALPRAZOLAM) 1  tablet by mouth once daily SINGULAIR 10 MG TABS (MONTELUKAST SODIUM) 1 tablet by mouth once daily BENADRYL DYE-FREE ALLERGY 25 MG CAPS (DIPHENHYDRAMINE HCL) as needed   Current Allergies (reviewed today): ! CODEINE ! PENICILLIN ! SULFA ! * LATEX Past Medical History:    Reviewed history from 08/29/2008 and no changes required:       Current Problems:        DIARRHEA (ICD-787.91)       OBESITY, UNSPECIFIED (ICD-278.00)       HYPERLIPIDEMIA (ICD-272.4)       DM (ICD-250.00)       COPD (ICD-496)       ASTHMA (ICD-493.90)       HYPERTENSION (ICD-401.9)       CARCINOMA, COLON, FAMILY HX (ICD-V16.0)       GERD (ICD-530.81)       HIATAL HERNIA (ICD-553.3)       ESOPHAGEAL STRICTURE (ICD-530.3)       DIVERTICULOSIS, COLON (ICD-562.10)       COLONIC POLYPS, HX OF (ICD-V12.72)          Past Surgical History:    Reviewed history from 08/29/2008 and no changes required:       Hysterectomy complete       Tonsillectomy       Lumbar surgery       Left Arthoscopy knee       Appendectomy   Family History:    Reviewed history from 08/29/2008 and no changes required:       Family History of Colon Cancer:first cousin and paternal aunt and uncle       Family History of Gastric Cancer:  Paternal Grandmother       Family History of Breast Cancer: Maternal Aunt, several paternal cousins       Family History of Diabetes: Mother       Family History of Heart Disease: Mother, maternal grandmother       Thyroid cancer: Aunt  Social History:    Reviewed history from 08/29/2008 and no changes required:       Married       Occupation: Charity fundraiser, [employer] Waverly Ferrari       Patient currently smokes.        Alcohol Use - no       Daily Caffeine Use       Illicit Drug Use - no       Patient gets regular exercise.  Vital Signs:  Patient Profile:   70 Years Old Female Height:     62 inches Weight:      164.38 pounds BMI:     30.17 BSA:     1.76 Pulse rate:   88 / minute Pulse rhythm:   regular BP  sitting:   124 / 72  (left arm)  Vitals Entered By: Merri Ray CMA (October 12, 2008 10:18 AM)                  Physical Exam  General:     Well developed, well nourished, no acute distress.healthy appearing.     Impression & Recommendations:  Problem # 1:  DIARRHEA (ICD-787.91) Assessment: Improved Improved with continued avoidance of Reglan  Problem # 2:  GERD (ICD-530.81) Assessment: Improved continued daily PPI use.  Problem # 3:  DM (ICD-250.00) Assessment: Improved continued management  by Dr. Christell Constant  Problem # 4:  HYPERTHYROIDISM (ICD-242.90) Assessment: Unchanged endocrinology consult pending   Patient Instructions: 1)  Please Continue current medications. 2)  Copy Sent To:Dr. Rudi Heap in [facility]. and Dr. Everardo All in endocrinology 3)  Please schedule a follow-up appointment as needed.

## 2010-10-08 NOTE — Procedures (Signed)
Summary: EGD    EGD  Procedure date:  11/17/2001  Findings:      Location: [facility]    Patient Name: Elizabeth [name], Elizabeth [name] MRN: [id number] Procedure Procedures: Panendoscopy (EGD) CPT: 43235.    with biopsy(s)/brushing(s). CPT: D1846139.    with esophageal dilation. CPT: G9296129.  Personnel: Endoscopist: Vania Rea. Jarold Motto, MD.  Exam Location: Exam performed in Endoscopy Suite.  Patient Consent: Procedure, Alternatives, Risks and Benefits discussed, consent obtained,  Indications Symptoms: Dysphagia. Reflux symptoms  History  Pre-Exam Physical: Performed Nov 17, 2001  Cardio-pulmonary exam, Abdominal exam, Extremity exam, Mental status exam WNL.  Exam Exam Info: Maximum depth of insertion Duodenum, intended Duodenum. Patient position: on left side. Duration of exam: 15 minutes. Gastric retroflexion performed. Images taken. ASA Classification: I. Tolerance: excellent.  Sedation Meds: Cetacaine Spray 2 sprays Fentanyl 75 mcg. Versed 6.5 mg.  Monitoring: BP and pulse monitoring done. Oximetry used. Supplemental O2 given  Fluoroscopy: Fluoroscopy was not used.  Instrument(s): PCF 140L.   Findings - STRICTURE / STENOSIS: Distal Esophagus.  Constriction: partial. Etiology: benign due to reflux. Lumen diameter is 14 mm. ICD9: Esophageal Stricture: 530.3.  - Dilation: Distal Esophagus. for esophageal stricture. Maloney dilator used, Diameter: 18 mm, No Resistance, No Heme present on extraction. 1  total dilators used. Patient tolerance excellent. Outcome: successful.  - HIATAL HERNIA: Prolapsing, 5 cms. in length. ICD9: Hernia, Hiatal: 553.3.Comments: Free reflux noted.  - DIAGNOSTIC TEST: from Antr[facility]. RUT done, results pending Reason: R/O H. pylori after recent Rx.   Assessment  Diagnoses: 530.3: Esophageal Stricture.  553.3: Hernia, Hiatal. GERD.   Events  Unplanned Intervention: No unplanned interventions were required.  Plans Medication(s): PPI:  Pantoprazole/Protonix 40 mg BID, starting Nov 17, 2001 for 4 wks.   Patient Education: Patient given standard instructions for: Reflux.  Disposition: After procedure patient sent to recovery.  Scheduling: Clinic Visit, to Vania Rea. Jarold Motto, MD, around Dec 18, 2001.    This report was created from the original endoscopy report, which was reviewed and signed by the above listed endoscopist.

## 2010-10-08 NOTE — Procedures (Signed)
Summary: Colon and Pathology   Colonoscopy  Procedure date:  12/01/2005  Findings:      Location:  [facility].    Procedures Next Due Date:    Colonoscopy: 12/2008  Patient Name: Elizabeth [name], Elizabeth [name] MRN: [id number] Procedure Procedures: Colonoscopy CPT: 19147.  Personnel: Endoscopist: Vania Rea. Jarold Motto, MD.  Exam Location: Exam performed in Outpatient Clinic. Outpatient  Patient Consent: Procedure, Alternatives, Risks and Benefits discussed, consent obtained, from patient. Consent was obtained by the RN.  Indications  Surveillance of: Adenomatous Polyp(s).  History  Current Medications: Patient is not currently taking Coumadin.  Pre-Exam Physical: Performed Dec 01, 2005. Cardio-pulmonary exam, Rectal exam, Abdominal exam, Extremity exam, Mental status exam WNL.  Comments: Pt. history reviewed/updated, physical exam performed prior to initiation of sedation? Exam Exam: Extent of exam reached: Cecum, extent intended: Cecum.  The cecum was identified by appendiceal orifice and IC valve. Patient position: on left side. Duration of exam: 20 minutes. Colon retroflexion performed. Images taken. ASA Classification: II. Tolerance: excellent.  Monitoring: Pulse and BP monitoring, Oximetry used. Supplemental O2 given. at 2 Liters.  Colon Prep Used Golytely for colon prep. Prep results: good.  Sedation Meds: Patient assessed and found to be appropriate for moderate (conscious) sedation. Fentanyl 75 mcg. given IV. Versed 7 mg. given IV.  Instrument(s): CF 140L. Serial [id number].  Findings - POLYP: Transverse Colon, Maximum size: 3 mm. diminutive, sessile polyp. Procedure:  snare with cautery, removed, retrieved, Polyp sent to pathology. ICD9: Colon Polyps: 211.3.  - DIVERTICULOSIS: Descending Colon to Sigmoid Colon. Not bleeding. ICD9: Diverticulosis, Colon: 562.10.  - NORMAL EXAM: Cecum to Rectum. Not Seen: Polyps. AVM's. Colitis. Tumors. Melanosis.  Crohn's. Hemorrhoids.   Assessment  Diagnoses: 211.3: Colon Polyps.  562.10: Diverticulosis, Colon.   Events  Unplanned Interventions: No intervention was required.  Plans  Post Exam Instructions: No aspirin or non-steroidal containing medications: 2 weeks.  Medication Plan: Await pathology. Referring provider to order medications.  Patient Education: Patient given standard instructions for: Polyps. Diverticulosis. Patient instructed to get routine colonoscopy every 5 years.  Disposition: After procedure patient sent to recovery. After recovery patient sent home.  Scheduling/Referral: Follow-Up prn.    CC: Rudi Heap, MD  This report was created from the original endoscopy report, which was reviewed and signed by the above listed endoscopist.

## 2010-10-08 NOTE — Procedures (Signed)
Summary: Colonoscopy   Colonoscopy  Procedure date:  09/20/2008  Findings:      Location:  [facility].     COLONOSCOPY PROCEDURE REPORT  PATIENT:  Elizabeth [name], Elizabeth [name]  MR#:  [id number] BIRTHDATE:   14-Oct-1940   GENDER:   female  ENDOSCOPIST:   Vania Rea. Jarold Motto, MD, [facility] Referred by: Rudi Heap, M.D.  PROCEDURE DATE:  09/20/2008 PROCEDURE:  Colonoscopy with biopsy ASA CLASS:   Class II INDICATIONS: unexplained diarrhea   MEDICATIONS:    Fentanyl 75 mcg IV, Versed 7 mg IV  DESCRIPTION OF PROCEDURE:   After the risks benefits and alternatives of the procedure were thoroughly explained, informed consent was obtained.  Digital rectal exam was performed and revealed no abnormalities.   The LB [id number] endoscope was introduced through the anus and advanced to the cecum, which was identified by both the appendix and ileocecal valve, without limitations.  The quality of the prep was good, using MoviPrep.  The instrument was then slowly withdrawn as the colon was fully examined. <<PROCEDUREIMAGES>>  <<OLD IMAGES>>  FINDINGS:  Internal hemorrhoids were found.  This was otherwise a normal examination. MULTIPLE BIOPSIES EVERY 10 CM. DONE FOR EXAM.   PERIANAL PAPILLAE NOTED.  The scope was then withdrawn from the patient and the procedure completed.  COMPLICATIONS:   None  ENDOSCOPIC IMPRESSION:  1) Internal hemorrhoids  2) Otherwise normal examination  R/O MICROSCOPIC/COLLAGENOUS COLITIS VS ALL FROM HYPERTHYROIDISM VS SEVERE IBS. RECOMMENDATIONS:  1) await biopsy results  ENDOCRINE CONSULT PENDING. SHE MAY NEED LOTRONEX RX.  REPEAT EXAM:   No   _______________________________ Vania Rea. Jarold Motto, MD, [facility]  CC:         REPORT OF SURGICAL PATHOLOGY   Case #: [id number] Patient Name: Elizabeth [name], Elizabeth [name] Office Chart Number:  N/A   MRN: [id number] Pathologist: Beulah Gandy. Luisa Hart, MD DOB/Age  15-Oct-1940 (Age: 70)    Gender: F Date Taken:  09/20/2008 Date  Received: 09/21/2008   FINAL DIAGNOSIS   ***MICROSCOPIC EXAMINATION AND DIAGNOSIS***   COLON, RANDOM BIOPSIES:  BENIGN COLONIC MUCOSA.  NO ACTIVE INFLAMMATION, MICROSCOPIC COLITIS OR GRANULOMAS.    COMMENT There is colorectal mucosa with normal crypt architecture and no objective increase in inflammation.  No active inflammation, microscopic colitis, collagenous colitis or significant chronic changes identified. No hyperplastic or adenomatous changes are seen, and there is no evidence of malignancy. (JDP:mj 09/22/08)   mj Date Reported:  09/22/2008     Beulah Gandy. Luisa Hart, MD *** Electronically Signed Out By JDP ***      September 22, 2008 MRN: [id number]    [facility] [address] [place], [place]  [address]    Dear Elizabeth [name],  I am pleased to inform you that the biopsies taken during your recent colonoscopy did not show any evidence of cancer upon pathologic examination.  Additional information/recommendations:  __No further action is needed at this time.  Please follow-up with      your primary care physician for your other healthcare needs.  XX__Please call [phone] to schedule a return visit to review      your condition.  __Continue with the treatment plan as outlined on the day of your      exam.  __You should have a repeat colonoscopy examination for this problem           in _ years.  Please call us if you are having persistent problems or have questions about your condition that have not been fully answered  at this time.  Sincerely,  Mardella Layman MD [facility]  This letter has been electronically signed by your physician.    This report was created from the original endoscopy report, which was reviewed and signed by the above listed endoscopist.

## 2010-10-16 ENCOUNTER — Other Ambulatory Visit: Payer: Self-pay | Admitting: Family Medicine

## 2010-10-16 DIAGNOSIS — R234 Changes in skin texture: Secondary | ICD-10-CM

## 2010-10-24 ENCOUNTER — Ambulatory Visit
Admission: RE | Admit: 2010-10-24 | Discharge: 2010-10-24 | Disposition: A | Discharge status: Home / Self Care | Payer: Medicare Other | Source: Ambulatory Visit | Attending: Family Medicine | Admitting: Family Medicine

## 2010-10-24 DIAGNOSIS — R234 Changes in skin texture: Secondary | ICD-10-CM

## 2010-12-20 ENCOUNTER — Ambulatory Visit (INDEPENDENT_AMBULATORY_CARE_PROVIDER_SITE_OTHER): Payer: Medicare Other | Admitting: Internal Medicine

## 2010-12-20 ENCOUNTER — Encounter: Payer: Self-pay | Admitting: Internal Medicine

## 2010-12-20 VITALS — BP 118/70 | HR 71 | Ht 62.0 in | Wt 186.8 lb

## 2010-12-20 DIAGNOSIS — J449 Chronic obstructive pulmonary disease, unspecified: Secondary | ICD-10-CM

## 2010-12-20 DIAGNOSIS — K449 Diaphragmatic hernia without obstruction or gangrene: Secondary | ICD-10-CM

## 2010-12-20 DIAGNOSIS — J4489 Other specified chronic obstructive pulmonary disease: Secondary | ICD-10-CM

## 2010-12-20 NOTE — Patient Instructions (Signed)
I really strongly encourage you to stay off the cigarettes forever.  Try replacing the ACE inhibitor ramipril/ Altace, with samples of Benicar /HCT 20/12.5, once daily. It can take a month for the ACE effect on cough to go away, if it does turn out to be a factor.   Continue to do every thing you can to minimize reflux and heart burn.  Sample Dulera 200-5      2 puffs and rinse mouth well, twice daily. It helps to just leave the sample on the bathroom sink and use it before you brush teeth.

## 2010-12-20 NOTE — Progress Notes (Signed)
  Subjective:    Patient ID: Elizabeth [name], female    DOB: 1941-06-19, 70 y.o.   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 She quit smoking 3 days ago. She complains of cough which she says began 6 weeks ago as "allergy".She got better initially after mucinex, Z pak then Avelox. Cough has now been worse again in last week. Still mostly dry. It wakes her and she has to sit up.New night sweats. Sinus congestion helped by chlortimeton, Some postnasal drip, but no purulence and no headache. Hoarseness new this week. Short of breath only after hard coughing fits. No pain. Main c/o is that she is tired/ exhausted from all the coughing- but she wasn't considering that sedating antihistamine may increase this. She is now finishing a prednisone taper.  Denies hx penumonia. Had a PFT 10 years ago and told then she had some COPD. Given singulair then for "asthma". Does give hx of GERD/ HH and seasonal allergic rhinitis.  Banana causes anaphyllaxis. May get chest tight from potatoes. Allergy skin testing in past- no vaccine.    Review of Systems See HPI Constitutional:   No weight loss, night sweats,  Fevers, chills,  HEENT:   No headaches,  Difficulty swallowing,  Tooth/dental problems,  Sore throat,                No sneezing, itching, ear ache, nasal congestion, post nasal drip,   CV:  No chest pain,  Orthopnea, PND, swelling in lower extremities, anasarca, dizziness, palpitations  GI , abdominal pain, nausea, vomiting, diarrhea, change in bowel habits, loss of appetite  Resp:.  No excess mucus,  No coughing up of blood.  No change in color of mucus.  No wheezing.  Skin: no rash or lesions.  GU: no dysuria, change in color of urine, no urgency or frequency.  No flank pain.  MS:  No joint pain or swelling.  No decreased range of motion.  No back pain.  Psych:  No change in mood or affect. No depression or anxiety.  No memory loss.      Objective:   Physical  Exam General- Alert, Oriented, Affect-appropriate, Distress- none acute  overweight  Skin- rash-none, lesions- none, excoriation- none  Lymphadenopathy- none  Head- atraumatic  Eyes- Gross vision intact, PERRLA, conjunctivae clear, secretions  Ears- Hearing, canals, Tm L ,   R ,  Nose- Clear, Septal dev, mucus, polyps, erosion, perforation   Throat- Mallampati II , mucosa clear , drainage- none, tonsils- atrophic.  Voice husky  Neck- flexible , trachea midline, no stridor , thyroid nl, carotid no bruit  Chest - symmetrical excursion , unlabored     Heart/CV- RRR , no murmur , no gallop  , no rub, nl s1 s2                     - JVD- none , edema- none, stasis changes- none, varices- none     Lung- Diminished, unlabored, wheeze- none, cough- none , dullness-none, rub- none.   Few crackles in bases.     Chest wall- atraumatic, no scar  Abd- tender-no, distended-no, bowel sounds-present, HSM- no  Br/ Gen/ Rectal- Not done, not indicated  Extrem- cyanosis- none, clubbing, none, atrophy- none, strength- nl  Neuro- grossly intact to observation         Assessment & Plan:

## 2010-12-23 LAB — GLUCOSE, CAPILLARY
Glucose-Capillary: 81 mg/dL (ref 70–99)
Glucose-Capillary: 88 mg/dL (ref 70–99)

## 2010-12-26 ENCOUNTER — Encounter: Payer: Self-pay | Admitting: Internal Medicine

## 2010-12-26 NOTE — Assessment & Plan Note (Signed)
We have discussed potential role of reflux in maintaining cough, especially cyclical cough. She has elevated head of bed.

## 2010-12-26 NOTE — Assessment & Plan Note (Addendum)
Cough is consistent with bronchitis. Her ACE inhibitor and her hx of GERD may be contributing. We will try her off Altace, giving a few weeks trial of Benicar HCT samples. Continued smoking cessation is crucial and reinforced. Sample [facility] for higher inhaled steroid exposure.  She is intolerant of codeine and hydrocodone with nausea and vomiting.  I am supporting her staying out of work till cough settles down- she feels bending and moving around much would be disruptive.

## 2011-01-01 ENCOUNTER — Encounter: Payer: Self-pay | Admitting: Internal Medicine

## 2011-01-24 NOTE — Op Note (Signed)
NAME:  Elizabeth [name], Elizabeth [name]                            ACCOUNT NO.:  [id number]   MEDICAL RECORD NO.:  [id number]                   PATIENT TYPE:  OIB   LOCATION:  NA                                   FACILITY:  [facility]   PHYSICIAN:  Danae Orleans. Venetia Maxon, M.D.               DATE OF BIRTH:  [birth year]   DATE OF PROCEDURE:  06/20/2002  DATE OF DISCHARGE:                                 OPERATIVE REPORT   PREOPERATIVE DIAGNOSIS:  Herniated lumbar disk, L5-S1 right, with  spondylosis, degenerative disk disease, and radiculopathy.   POSTOPERATIVE DIAGNOSIS:  Herniated lumbar disk, L5-S1 right, with  spondylosis, degenerative disk disease, and radiculopathy.   PROCEDURE:  Hemisemilaminotomy, L5-S1 right, with microdiskectomy and  microdissection.   SURGEON:  Danae Orleans. Venetia Maxon, M.D.   ASSISTANT:  __________.   ANESTHESIA:  General endotracheal anesthesia.   ESTIMATED BLOOD LOSS:  Less than 50 cc.   COMPLICATIONS:  None.   DISPOSITION:  To recovery.   INDICATIONS:  The patient is a 70 year old woman with right lower extremity  pain with a large central to right-sided disk herniation at L5-S1.  It was  elected to take her to surgery for microdiskectomy at L5-S1 on the right.   DESCRIPTION OF PROCEDURE:  The patient was brought to the operating room.  Following the satisfactory and uncomplicated induction of general anesthesia  and placement of intravenous lines, she was placed in a prone position on  the [employer].  Care was taken to pad all soft tissue and bony  prominences.  Her lower back was then prepped and draped in the usual  sterile fashion.  The area of planned incision was infiltrated with 0.25%  Marcaine and 0.5% lidocaine and 1:200,000 epinephrine.  An incision was made  in the midline and carried through approximately three inches of adipose  tissue to the lumbodorsal fascia, which was incised on the right side of  midline.  Subperiosteal dissection was performed, exposing the  L5-S1  interspace.  An intraoperative x-ray confirmed correct orientation.  Hemisemilaminotomy of L5 was then performed using the Anspach drill with an  M8 equivalent bur.  A laminotomy was then completed over the superior aspect  of the S1 lamina.  The ligamentum flavum was then detached and removed in  piecemeal fashion.  The lateral recess was also decompressed.  The  microscope was then brought into the field and using microdissection  technique, the S1 nerve root and common dural tube were mobilized.  There  was a large medial central to the right paracentral disk herniation, which  was compressing the common dural tube and S1 nerve root.  This was covered  by a thin covering of annulus.  The D'Errico nerve root retractor was used  to facilitate exposure.  The annulus was then incised with a 15 blade and  disk material was removed in a piecemeal fashion using a variety  of  pituitary rongeurs and the interspace was evacuated of residual disk  material.  The [employer] downgoing curettes were used to remove  hypertrophied annulus and remaining central disk material.  The interspace  was then evacuated of residual disk material and the residual hypertrophied  annulus was removed with Kerrison rongeurs.  Using a coronary dilator, the  floor of the canal was palpated and found to be well-decompressed, as was  the S1 nerve root.  Hemostasis was assured with Gelfoam soaked in thrombin.  The self-retaining retractor was then removed.  The microscope was taken out  of the field.  The lumbodorsal fascia was closed with 0 Vicryl suture, the  suture was reapproximated with 0 Vicryl and 2-0 Vicryl interrupted inverted  sutures, and the skin edge was reapproximated with 3-0 Vicryl subcuticular  stitch.  The wound was dressed with Dermabond.  The patient was extubated in  the operating room and taken to the recovery room in stable and satisfactory  condition, having tolerated her operation  well.  All counts were correct at  the end of the case.                                               Danae Orleans. Venetia Maxon, M.D.    JDS/MEDQ  D:  06/20/2002  T:  06/21/2002  Job:  [id number]

## 2011-01-24 NOTE — Cardiovascular Report (Signed)
NAMERIDHIMA, GOLBERG NO.:  [id number]   MEDICAL RECORD NO.:  [id number]          PATIENT TYPE:  OIB   LOCATION:  6501                         FACILITY:  [facility]   PHYSICIAN:  Jonelle Sidle, M.D. LHCDATE OF BIRTH:  04/16/1941   DATE OF PROCEDURE:  12/26/2004  DATE OF DISCHARGE:  12/26/2004                              CARDIAC CATHETERIZATION   INDICATIONS:  Ms. Stetler is a pleasant 70 year old woman with a history of  hypertension, dyslipidemia, type 2 diabetes mellitus, tobacco use, and  asthma. She has had recent problems with mild dyspnea and lower extremity  edema. Given her cardiac risk factor profile, she is referred for diagnostic  coronary angiography and left ventriculography to assess coronary anatomy  and left ventricular function.   PROCEDURE:  1.  Left heart catheterization.  2.  Selective coronary angiography.  3.  Left ventriculography.   DESCRIPTION OF PROCEDURE:  The area about the right femoral artery was  anesthetized 1% lidocaine and a 4-French sheath was placed in the right  femoral artery via modified Seldinger technique. Standard preformed 4-French  JL-4 and JR-4 catheters used for selective coronary angiography and angled  pigtail catheters used for left heart catheterization and left  ventriculography. All exchanges were made over wire. The patient tolerated  procedure well without immediate complications.   HEMODYNAMIC RESULTS:  1.  Left ventricle 151/25 mmHg.  2.  Aorta 152/76 mmHg.   ANGIOGRAPHIC FINDINGS:  1.  Left main coronary artery is free of significant flow-limiting coronary      atherosclerosis.  2.  The left anterior descending is a medium caliber vessel with three small      diagonal branches. There are minor Luminal irregularities noted      throughout this system with approximately 30% diffuse mid-vessel      stenosis. There is also a 30-40% ostial stenosis involving the first      diagonal branch. No  flow-limiting stenoses are noted.  3.  The circumflex coronary artery is a medium caliber vessel. There is a      very small branch in the AV groove and two larger bifurcating obtuse      marginal branches. Minor Luminal irregularities are noted without flow-      limiting stenoses.  4.  The right coronary artery is a medium caliber vessel with posterior      descending branch. Minor Luminal irregularities are noted. There is a      20% stenosis in the proximal to mid-vessel.   LEFT VENTRICULOGRAPHY:  Left ventriculography was performed in the RAO  projection revealing an ejection fraction approximately 65% in the setting  of ventricular ectopy without significant mitral regurgitation or focal wall  motion abnormality.   DIAGNOSES:  1.  Mild coronary atherosclerosis as outlined with no flow-limiting stenoses      in the major epicardial vessels.  2.  Left ventricular ejection fraction of approximately 65% in the setting      of ventricular ectopy. No significant mitral regurgitation is noted. The      left ventricular end-diastolic pressure is  increased at 25 mmHg. At this      point would suspect an element of diastolic dysfunction particularly      given elevated left renal end-diastolic pressure.      The patient is already on an ACE inhibitor and diuretic. We will plan to      continue maximizing medical therapy. We will arrange a follow-up 2-D      echocardiogram for diastolic parameters and also to exclude      significantly elevated pulmonary pressures. She will have follow-up in      the office to review this and her progress.      SGM/MEDQ  D:  12/26/2004  T:  12/27/2004  Job:  [id number]   cc:   Learta Codding, M.D. [facility]   Ernestina Penna, M.D.  [address] [place]  [place] [address]  Fax: [phone]

## 2011-01-24 NOTE — Op Note (Signed)
[place]. [facility]  Patient:    Elizabeth [name], Elizabeth [name]                         MRN: [id number] Proc. Date: 08/25/00 Adm. Date:  [id number] Attending:  Ronne Binning                           Operative Report  PREOPERATIVE DIAGNOSIS:  Carpal tunnel syndrome, right hand.  POSTOPERATIVE DIAGNOSIS:  Carpal tunnel syndrome, right hand.  PROCEDURE:  Decompression right median nerve.  SURGEON:  Nicki Reaper, M.D.  ASSISTANT:  Joaquin Courts, R.N.  ANESTHESIA:  Forearm-based IV regional.  ANESTHESIOLOGIST:  Cliffton Asters. Ivin Booty, M.D.  HISTORY:  The patient is a 70 year old female with a history of carpal tunnel syndrome, EMG nerve conductions positive, which has not responded to conservative treatment.  DESCRIPTION OF PROCEDURE:  The patient was brought to the operating room, where a forearm-based IV regional anesthetic was carried out without difficulty.  She was prepped and draped using Duraprep, Latex precautions were used throughout the procedure.  A longitudinal incision was made in the forearm, carried down through the subcutaneous tissue, bleeders electrocauterized.  The palmar fascia was split, superficial palmar arch identified.  The flexor tendon of the ring and little finger identified to the ulnar side of the median nerve.  The carpal retinaculum was incised with sharp dissection.  A right angle and Sewell retractor was placed between the skin and forearm fascia.  The fascia was released for approximately 3 cm proximal to the wrist crease under direct vision.  Canal was explored, and no further lesions were identified.  Tenosynovial tissue was moderately thickened, and the wound was irrigated.  The skin was closed with interrupted 5-0 nylon suture.  Sterile compressive dressing and splint was applied.  The patient tolerated the procedure well and was taken to the recovery room for observation in satisfactory condition.  She is discharged home, to return  to the [facility] in one week, on Talwin NX and Septra DS. DD:  08/25/00 TD:  08/26/00 Job: [id number]

## 2011-01-30 ENCOUNTER — Ambulatory Visit: Payer: Medicare Other | Admitting: Internal Medicine

## 2011-03-10 ENCOUNTER — Ambulatory Visit: Payer: Medicare Other | Admitting: Internal Medicine

## 2011-04-08 ENCOUNTER — Other Ambulatory Visit: Payer: Self-pay | Admitting: Family Medicine

## 2011-04-08 ENCOUNTER — Ambulatory Visit (HOSPITAL_COMMUNITY)
Admission: RE | Admit: 2011-04-08 | Discharge: 2011-04-08 | Disposition: A | Discharge status: Home / Self Care | Payer: Medicare Other | Source: Ambulatory Visit | Attending: Family Medicine | Admitting: Family Medicine

## 2011-04-08 DIAGNOSIS — I10 Essential (primary) hypertension: Secondary | ICD-10-CM | POA: Insufficient documentation

## 2011-04-08 DIAGNOSIS — K5730 Diverticulosis of large intestine without perforation or abscess without bleeding: Secondary | ICD-10-CM | POA: Insufficient documentation

## 2011-04-08 DIAGNOSIS — E119 Type 2 diabetes mellitus without complications: Secondary | ICD-10-CM | POA: Insufficient documentation

## 2011-04-08 DIAGNOSIS — R109 Unspecified abdominal pain: Secondary | ICD-10-CM

## 2011-04-08 DIAGNOSIS — D3500 Benign neoplasm of unspecified adrenal gland: Secondary | ICD-10-CM | POA: Insufficient documentation

## 2011-04-08 DIAGNOSIS — R1031 Right lower quadrant pain: Secondary | ICD-10-CM | POA: Insufficient documentation

## 2011-04-08 DIAGNOSIS — Q619 Cystic kidney disease, unspecified: Secondary | ICD-10-CM | POA: Insufficient documentation

## 2011-04-08 MED ORDER — IOHEXOL 300 MG/ML  SOLN
100.0000 mL | Freq: Once | INTRAMUSCULAR | Status: AC | PRN
  Administered 2011-04-08: 100 mL via INTRAVENOUS

## 2011-04-11 ENCOUNTER — Other Ambulatory Visit: Payer: Self-pay | Admitting: Family Medicine

## 2011-04-11 DIAGNOSIS — R1030 Lower abdominal pain, unspecified: Secondary | ICD-10-CM

## 2011-04-11 DIAGNOSIS — R109 Unspecified abdominal pain: Secondary | ICD-10-CM

## 2011-04-19 ENCOUNTER — Ambulatory Visit
Admission: RE | Admit: 2011-04-19 | Discharge: 2011-04-19 | Disposition: A | Discharge status: Home / Self Care | Payer: Medicare Other | Source: Ambulatory Visit | Attending: Family Medicine | Admitting: Family Medicine

## 2011-04-19 ENCOUNTER — Other Ambulatory Visit: Payer: Medicare Other

## 2011-04-19 DIAGNOSIS — R1030 Lower abdominal pain, unspecified: Secondary | ICD-10-CM

## 2011-04-19 DIAGNOSIS — R109 Unspecified abdominal pain: Secondary | ICD-10-CM

## 2011-04-19 MED ORDER — GADOBENATE DIMEGLUMINE 529 MG/ML IV SOLN
18.0000 mL | Freq: Once | INTRAVENOUS | Status: AC | PRN
  Administered 2011-04-19: 18 mL via INTRAVENOUS

## 2011-11-12 ENCOUNTER — Other Ambulatory Visit: Payer: Self-pay | Admitting: Family Medicine

## 2011-11-13 ENCOUNTER — Other Ambulatory Visit: Payer: Self-pay | Admitting: Family Medicine

## 2011-11-13 DIAGNOSIS — Z139 Encounter for screening, unspecified: Secondary | ICD-10-CM

## 2011-11-14 ENCOUNTER — Ambulatory Visit (INDEPENDENT_AMBULATORY_CARE_PROVIDER_SITE_OTHER): Payer: Medicare Other | Admitting: Cardiovascular Disease

## 2011-11-14 ENCOUNTER — Encounter: Payer: Self-pay | Admitting: Cardiovascular Disease

## 2011-11-14 VITALS — BP 147/74 | HR 73 | Ht 62.0 in | Wt 187.0 lb

## 2011-11-14 DIAGNOSIS — R0600 Dyspnea, unspecified: Secondary | ICD-10-CM | POA: Insufficient documentation

## 2011-11-14 DIAGNOSIS — R9431 Abnormal electrocardiogram [ECG] [EKG]: Secondary | ICD-10-CM

## 2011-11-14 DIAGNOSIS — I10 Essential (primary) hypertension: Secondary | ICD-10-CM

## 2011-11-14 DIAGNOSIS — R0609 Other forms of dyspnea: Secondary | ICD-10-CM

## 2011-11-14 NOTE — Patient Instructions (Signed)
Your follow up will be based on your test results.  Your physician recommends that you continue on your current medications as directed. Please refer to the Current Medication list given to you today. Your physician has requested that you have an echocardiogram. Echocardiography is a painless test that uses sound waves to create images of your heart. It provides your doctor with information about the size and shape of your heart and how well your heart's chambers and valves are working. This procedure takes approximately one hour. There are no restrictions for this procedure.

## 2011-11-17 ENCOUNTER — Encounter: Payer: Self-pay | Admitting: Cardiovascular Disease

## 2011-11-17 NOTE — Progress Notes (Signed)
HPI  This is a 71 year old female who is referred by Dr. Christell Constant for evaluation of an abnormal ECG. She had a routine ECG performed recently which showed evidence of incomplete right bundle branch block. The patient has no significant previous cardiac history. She did have a cardiac catheterization done in 2006 which showed mild nonobstructive coronary artery disease with normal ejection fraction. She has chronic medical conditions that include hypertension, hyperlipidemia and tobacco use. There is questionable history of COPD. There is no family history of premature coronary artery disease. She is very active and able to perform her activities of daily living without any reported exertional chest pain. She does complain of mild dyspnea. There has been no orthopnea, PND or lower extremity edema.  Allergies  Allergen Reactions  . Banana Anaphylaxis  . Benicar (Olmesartan Medoxomil)   . Codeine   . Diovan (Valsartan)   . Latex     REACTION: red rash  . Penicillins   . Sulfonamide Derivatives   . Ace Inhibitors Cough     Current Outpatient Prescriptions on File Prior to Visit  Medication Sig Dispense Refill  . ALPRAZolam (XANAX) 0.25 MG tablet Take 0.25 mg by mouth 2 (two) times daily as needed.       Marland Kitchen escitalopram (LEXAPRO) 10 MG tablet Take 10 mg by mouth daily.        Marland Kitchen omeprazole (PRILOSEC) 20 MG capsule Take 20 mg by mouth daily.        Marland Kitchen oxybutynin (DITROPAN XL) 15 MG 24 hr tablet Take 15 mg by mouth daily.        . sitaGLIPtan-metformin (JANUMET) 50-1000 MG per tablet Take 1 tablet by mouth daily.           Past Medical History  Diagnosis Date  . Diarrhea   . Obesity, unspecified   . Other and unspecified hyperlipidemia   . Type II or unspecified type diabetes mellitus without mention of complication, not stated as uncontrolled   . Chronic airway obstruction, not elsewhere classified   . Unspecified asthma   . Unspecified essential hypertension   . Family history of  malignant neoplasm of gastrointestinal tract   . Esophageal reflux   . Diaphragmatic hernia without mention of obstruction or gangrene   . Stricture and stenosis of esophagus   . Diverticulosis of colon (without mention of hemorrhage)   . Personal history of colonic polyps      Past Surgical History  Procedure Date  . Total abdominal hysterectomy   . Tonsillectomy   . Lumbar disc surgery   . Knee arthroscopy     left  . Appendectomy   . Cardiac catheterization 2006    LAD: 30%, RCA : 20%, normal EF, elevated LVEDP     Family History  Problem Relation Age of Onset  . Colon cancer Other     first cousin, paternal aunt and uncle  . Cancer Paternal Grandmother     gastric  . Breast cancer Maternal Aunt     several paternal cousins  . Diabetes Mother   . Heart disease Mother   . Heart disease Maternal Grandmother   . Thyroid cancer Other     aunt  . Thyroid disease Sister     uncertain type-not cancer     History   Social History  . Marital Status: Married    Spouse Name: N/A    Number of Children: N/A  . Years of Education: N/A   Occupational History  . RN  [employer]   Social History Main Topics  . Smoking status: Current Everyday Smoker -- 1.0 packs/day for 20 years    Types: Cigarettes    Last Attempt to Quit: 12/17/2010  . Smokeless tobacco: Never Used  . Alcohol Use: No  . Drug Use: No  . Sexually Active: Not on file   Other Topics Concern  . Not on file   Social History Narrative  . No narrative on file     ROS Constitutional: Negative for fever, chills, diaphoresis, activity change, appetite change and fatigue.  HENT: Negative for hearing loss, nosebleeds, congestion, sore throat, facial swelling, drooling, trouble swallowing, neck pain, voice change, sinus pressure and tinnitus.  Eyes: Negative for photophobia, pain, discharge and visual disturbance.  Respiratory: Negative for apnea, cough and wheezing.  Cardiovascular:  Negative for chest pain, palpitations and leg swelling.  Gastrointestinal: Negative for nausea, vomiting, abdominal pain, diarrhea, constipation, blood in stool and abdominal distention.  Genitourinary: Negative for dysuria, urgency, frequency, hematuria and decreased urine volume.  Musculoskeletal: Negative for myalgias, back pain, joint swelling, arthralgias and gait problem.  Skin: Negative for color change, pallor, rash and wound.  Neurological: Negative for dizziness, tremors, seizures, syncope, speech difficulty, weakness, light-headedness, numbness and headaches.  Psychiatric/Behavioral: Negative for suicidal ideas, hallucinations, behavioral problems and agitation. The patient is not nervous/anxious.     PHYSICAL EXAM   BP 147/74  Pulse 73  Ht 5\' 2"  (1.575 m)  Wt 187 lb (84.823 kg)  BMI 34.20 kg/m2  SpO2 97% Constitutional: She is oriented to person, place, and time. She appears well-developed and well-nourished. No distress.  HENT: No nasal discharge.  Head: Normocephalic and atraumatic.  Eyes: Pupils are equal and round. Right eye exhibits no discharge. Left eye exhibits no discharge.  Neck: Normal range of motion. Neck supple. No JVD present. No thyromegaly present.  Cardiovascular: Normal rate, regular rhythm, normal heart sounds. Exam reveals no gallop and no friction rub. No murmur heard.  Pulmonary/Chest: Effort normal and breath sounds normal. No stridor. No respiratory distress. She has no wheezes. She has no rales. She exhibits no tenderness.  Abdominal: Soft. Bowel sounds are normal. She exhibits no distension. There is no tenderness. There is no rebound and no guarding.  Musculoskeletal: Normal range of motion. She exhibits no edema and no tenderness.  Neurological: She is alert and oriented to person, place, and time. Coordination normal.  Skin: Skin is warm and dry. No rash noted. She is not diaphoretic. No erythema. No pallor.  Psychiatric: She has a normal mood  and affect. Her behavior is normal. Judgment and thought content normal.     EKG: Normal sinus rhythm with incomplete right bundle branch block. No significant ST or T wave changes.   ASSESSMENT AND PLAN

## 2011-11-17 NOTE — Assessment & Plan Note (Signed)
The patient has mild exertional dyspnea without chest pain. Her ECG is showing incomplete right bundle branch block which is overall a benign finding. She has no symptoms suggestive of angina. She is physically very active and reports no exertional chest pain. I suspect that her dyspnea is likely multifactorial due to her prolonged history of smoking and possible COPD. There also might be an element of diastolic heart failure. Her filling pressures were elevated in 2006 during her cardiac catheterization. Due to all of that, I recommend a transthoracic echocardiogram to evaluate LV systolic function and the possibility of pulmonary hypertension. No stress testing will be ordered at this time given that her symptoms are overall mild.

## 2011-11-17 NOTE — Assessment & Plan Note (Signed)
Her blood pressure is mildly elevated. If it remains high up on followup, consider a small dose thiazide diuretic which might also improve her dyspnea.

## 2011-11-20 ENCOUNTER — Ambulatory Visit (HOSPITAL_COMMUNITY)
Admission: RE | Admit: 2011-11-20 | Discharge: 2011-11-20 | Disposition: A | Discharge status: Home / Self Care | Payer: Medicare Other | Source: Ambulatory Visit | Attending: Family Medicine | Admitting: Family Medicine

## 2011-11-20 DIAGNOSIS — Z1231 Encounter for screening mammogram for malignant neoplasm of breast: Secondary | ICD-10-CM | POA: Insufficient documentation

## 2011-11-20 DIAGNOSIS — Z139 Encounter for screening, unspecified: Secondary | ICD-10-CM

## 2011-11-27 ENCOUNTER — Other Ambulatory Visit: Payer: Self-pay

## 2011-11-27 ENCOUNTER — Other Ambulatory Visit (INDEPENDENT_AMBULATORY_CARE_PROVIDER_SITE_OTHER): Payer: Medicare Other

## 2011-11-27 DIAGNOSIS — R9431 Abnormal electrocardiogram [ECG] [EKG]: Secondary | ICD-10-CM

## 2011-11-27 DIAGNOSIS — R0600 Dyspnea, unspecified: Secondary | ICD-10-CM

## 2011-11-28 ENCOUNTER — Encounter: Payer: Self-pay | Admitting: *Deleted

## 2012-02-12 ENCOUNTER — Other Ambulatory Visit: Payer: Self-pay | Admitting: Family Medicine

## 2012-02-12 DIAGNOSIS — R0989 Other specified symptoms and signs involving the circulatory and respiratory systems: Secondary | ICD-10-CM

## 2012-02-12 DIAGNOSIS — R05 Cough: Secondary | ICD-10-CM

## 2012-02-12 DIAGNOSIS — R059 Cough, unspecified: Secondary | ICD-10-CM

## 2012-02-18 ENCOUNTER — Ambulatory Visit (HOSPITAL_COMMUNITY)
Admission: RE | Admit: 2012-02-18 | Discharge: 2012-02-18 | Disposition: A | Discharge status: Home / Self Care | Payer: Medicare Other | Source: Ambulatory Visit | Attending: Family Medicine | Admitting: Family Medicine

## 2012-02-18 DIAGNOSIS — R059 Cough, unspecified: Secondary | ICD-10-CM

## 2012-02-18 DIAGNOSIS — R05 Cough: Secondary | ICD-10-CM | POA: Insufficient documentation

## 2012-02-18 DIAGNOSIS — R918 Other nonspecific abnormal finding of lung field: Secondary | ICD-10-CM | POA: Insufficient documentation

## 2012-02-18 DIAGNOSIS — R0989 Other specified symptoms and signs involving the circulatory and respiratory systems: Secondary | ICD-10-CM

## 2012-02-18 MED ORDER — IOHEXOL 300 MG/ML  SOLN
80.0000 mL | Freq: Once | INTRAMUSCULAR | Status: AC | PRN
  Administered 2012-02-18: 80 mL via INTRAVENOUS

## 2012-02-27 ENCOUNTER — Encounter (HOSPITAL_COMMUNITY): Payer: Self-pay | Admitting: *Deleted

## 2012-02-27 ENCOUNTER — Emergency Department (HOSPITAL_COMMUNITY): Payer: Medicare Other

## 2012-02-27 ENCOUNTER — Inpatient Hospital Stay (HOSPITAL_COMMUNITY): Payer: Medicare Other

## 2012-02-27 ENCOUNTER — Inpatient Hospital Stay (HOSPITAL_COMMUNITY)
Admission: EM | Admit: 2012-02-27 | Discharge: 2012-02-29 | DRG: 065 | Disposition: A | Discharge status: Home / Self Care | Payer: Medicare Other | Attending: Family Medicine | Admitting: Family Medicine

## 2012-02-27 DIAGNOSIS — I671 Cerebral aneurysm, nonruptured: Secondary | ICD-10-CM

## 2012-02-27 DIAGNOSIS — Z860100 Personal history of colon polyps, unspecified: Secondary | ICD-10-CM

## 2012-02-27 DIAGNOSIS — R4781 Slurred speech: Secondary | ICD-10-CM

## 2012-02-27 DIAGNOSIS — G459 Transient cerebral ischemic attack, unspecified: Secondary | ICD-10-CM

## 2012-02-27 DIAGNOSIS — R0600 Dyspnea, unspecified: Secondary | ICD-10-CM

## 2012-02-27 DIAGNOSIS — K219 Gastro-esophageal reflux disease without esophagitis: Secondary | ICD-10-CM

## 2012-02-27 DIAGNOSIS — R197 Diarrhea, unspecified: Secondary | ICD-10-CM

## 2012-02-27 DIAGNOSIS — IMO0002 Reserved for concepts with insufficient information to code with codable children: Secondary | ICD-10-CM

## 2012-02-27 DIAGNOSIS — I6340 Cerebral infarction due to embolism of unspecified cerebral artery: Secondary | ICD-10-CM

## 2012-02-27 DIAGNOSIS — K222 Esophageal obstruction: Secondary | ICD-10-CM

## 2012-02-27 DIAGNOSIS — E785 Hyperlipidemia, unspecified: Secondary | ICD-10-CM

## 2012-02-27 DIAGNOSIS — K5730 Diverticulosis of large intestine without perforation or abscess without bleeding: Secondary | ICD-10-CM

## 2012-02-27 DIAGNOSIS — R269 Unspecified abnormalities of gait and mobility: Secondary | ICD-10-CM | POA: Diagnosis present

## 2012-02-27 DIAGNOSIS — H539 Unspecified visual disturbance: Secondary | ICD-10-CM

## 2012-02-27 DIAGNOSIS — I10 Essential (primary) hypertension: Secondary | ICD-10-CM

## 2012-02-27 DIAGNOSIS — E1159 Type 2 diabetes mellitus with other circulatory complications: Secondary | ICD-10-CM | POA: Diagnosis present

## 2012-02-27 DIAGNOSIS — F17200 Nicotine dependence, unspecified, uncomplicated: Secondary | ICD-10-CM | POA: Diagnosis present

## 2012-02-27 DIAGNOSIS — J4489 Other specified chronic obstructive pulmonary disease: Secondary | ICD-10-CM

## 2012-02-27 DIAGNOSIS — I69398 Other sequelae of cerebral infarction: Secondary | ICD-10-CM

## 2012-02-27 DIAGNOSIS — K449 Diaphragmatic hernia without obstruction or gangrene: Secondary | ICD-10-CM

## 2012-02-27 DIAGNOSIS — J449 Chronic obstructive pulmonary disease, unspecified: Secondary | ICD-10-CM | POA: Diagnosis present

## 2012-02-27 DIAGNOSIS — Z7982 Long term (current) use of aspirin: Secondary | ICD-10-CM

## 2012-02-27 DIAGNOSIS — E119 Type 2 diabetes mellitus without complications: Secondary | ICD-10-CM

## 2012-02-27 DIAGNOSIS — E669 Obesity, unspecified: Secondary | ICD-10-CM

## 2012-02-27 DIAGNOSIS — R634 Abnormal weight loss: Secondary | ICD-10-CM

## 2012-02-27 DIAGNOSIS — E0590 Thyrotoxicosis, unspecified without thyrotoxic crisis or storm: Secondary | ICD-10-CM

## 2012-02-27 DIAGNOSIS — Z720 Tobacco use: Secondary | ICD-10-CM

## 2012-02-27 DIAGNOSIS — I639 Cerebral infarction, unspecified: Secondary | ICD-10-CM

## 2012-02-27 DIAGNOSIS — J45909 Unspecified asthma, uncomplicated: Secondary | ICD-10-CM

## 2012-02-27 DIAGNOSIS — I6529 Occlusion and stenosis of unspecified carotid artery: Secondary | ICD-10-CM | POA: Diagnosis present

## 2012-02-27 DIAGNOSIS — R29810 Facial weakness: Secondary | ICD-10-CM | POA: Diagnosis present

## 2012-02-27 DIAGNOSIS — I152 Hypertension secondary to endocrine disorders: Secondary | ICD-10-CM | POA: Diagnosis present

## 2012-02-27 DIAGNOSIS — E871 Hypo-osmolality and hyponatremia: Secondary | ICD-10-CM | POA: Diagnosis present

## 2012-02-27 DIAGNOSIS — I6350 Cerebral infarction due to unspecified occlusion or stenosis of unspecified cerebral artery: Principal | ICD-10-CM | POA: Diagnosis present

## 2012-02-27 DIAGNOSIS — R2681 Unsteadiness on feet: Secondary | ICD-10-CM

## 2012-02-27 HISTORY — DX: Nicotine dependence, unspecified, uncomplicated: F17.200

## 2012-02-27 HISTORY — DX: Headache: R51

## 2012-02-27 HISTORY — DX: Anxiety disorder, unspecified: F41.9

## 2012-02-27 LAB — BASIC METABOLIC PANEL
BUN: 10 mg/dL (ref 6–23)
CO2: 25 mEq/L (ref 19–32)
Calcium: 9.8 mg/dL (ref 8.4–10.5)
Chloride: 96 mEq/L (ref 96–112)
Creatinine, Ser: 0.75 mg/dL (ref 0.50–1.10)
GFR calc Af Amer: >90 mL/min (ref >90)
GFR calc non Af Amer: 84 mL/min — ABNORMAL LOW (ref >90)
Glucose, Bld: 103 mg/dL — ABNORMAL HIGH (ref 70–99)
Potassium: 3.6 mEq/L (ref 3.5–5.1)
Sodium: 131 mEq/L — ABNORMAL LOW (ref 135–145)

## 2012-02-27 LAB — APTT: aPTT: 32 seconds (ref 24–37)

## 2012-02-27 LAB — CBC
HCT: 39.9 % (ref 36.0–46.0)
Hemoglobin: 13.9 g/dL (ref 12.0–15.0)
MCH: 28.9 pg (ref 26.0–34.0)
MCHC: 34.8 g/dL (ref 30.0–36.0)
MCV: 83 fL (ref 78.0–100.0)
Platelets: 283 10*3/uL (ref 150–400)
RBC: 4.81 MIL/uL (ref 3.87–5.11)
RDW: 13.2 % (ref 11.5–15.5)
WBC: 7 10*3/uL (ref 4.0–10.5)

## 2012-02-27 LAB — GLUCOSE, CAPILLARY
Glucose-Capillary: 111 mg/dL — ABNORMAL HIGH (ref 70–99)
Glucose-Capillary: 99 mg/dL (ref 70–99)

## 2012-02-27 LAB — CARDIAC PANEL(CRET KIN+CKTOT+MB+TROPI)
CK, MB: 2.7 ng/mL (ref 0.3–4.0)
CK, MB: 3 ng/mL (ref 0.3–4.0)
Relative Index: INVALID (ref 0.0–2.5)
Relative Index: 2.7 — ABNORMAL HIGH (ref 0.0–2.5)
Total CK: 95 U/L (ref 7–177)
Total CK: 112 U/L (ref 7–177)
Troponin I: <0.3 ng/mL (ref <0.30)
Troponin I: <0.3 ng/mL (ref <0.30)

## 2012-02-27 LAB — PROTIME-INR
INR: 1.07 (ref 0.00–1.49)
Prothrombin Time: 14.1 seconds (ref 11.6–15.2)

## 2012-02-27 MED ORDER — INSULIN ASPART 100 UNIT/ML ~~LOC~~ SOLN: 0.0000 [IU] | Freq: Three times a day (TID) | SUBCUTANEOUS | Status: DC

## 2012-02-27 MED ORDER — SODIUM CHLORIDE 0.9 % IV SOLN
INTRAVENOUS | Status: DC
  Administered 2012-02-27: 21:00:00 via INTRAVENOUS

## 2012-02-27 MED ORDER — ALPRAZOLAM 0.25 MG PO TABS
0.2500 mg | ORAL_TABLET | Freq: Two times a day (BID) | ORAL | Status: DC | PRN
  Administered 2012-02-29: 0.25 mg via ORAL
  Filled 2012-02-27: qty 1

## 2012-02-27 MED ORDER — OXYBUTYNIN CHLORIDE ER 15 MG PO TB24
15.0000 mg | ORAL_TABLET | Freq: Every day | ORAL | Status: DC
  Administered 2012-02-28 – 2012-02-29 (×2): 15 mg via ORAL
  Filled 2012-02-27 (×3): qty 1

## 2012-02-27 MED ORDER — IPRATROPIUM BROMIDE 0.02 % IN SOLN: 0.5000 mg | Freq: Four times a day (QID) | RESPIRATORY_TRACT | Status: DC | PRN

## 2012-02-27 MED ORDER — PANTOPRAZOLE SODIUM 40 MG PO TBEC
40.0000 mg | DELAYED_RELEASE_TABLET | Freq: Every day | ORAL | Status: DC
  Administered 2012-02-27 – 2012-02-29 (×3): 40 mg via ORAL
  Filled 2012-02-27 (×2): qty 1

## 2012-02-27 MED ORDER — ROSUVASTATIN CALCIUM 20 MG PO TABS
20.0000 mg | ORAL_TABLET | Freq: Every day | ORAL | Status: DC
  Administered 2012-02-27 – 2012-02-28 (×2): 20 mg via ORAL
  Filled 2012-02-27 (×3): qty 1

## 2012-02-27 MED ORDER — ALBUTEROL SULFATE (5 MG/ML) 0.5% IN NEBU: 2.5000 mg | INHALATION_SOLUTION | Freq: Four times a day (QID) | RESPIRATORY_TRACT | Status: DC | PRN

## 2012-02-27 MED ORDER — SENNOSIDES-DOCUSATE SODIUM 8.6-50 MG PO TABS: 1.0000 | ORAL_TABLET | Freq: Every evening | ORAL | Status: DC | PRN

## 2012-02-27 MED ORDER — HYDROCHLOROTHIAZIDE 12.5 MG PO CAPS
12.5000 mg | ORAL_CAPSULE | Freq: Every day | ORAL | Status: DC
  Administered 2012-02-28 – 2012-02-29 (×2): 12.5 mg via ORAL
  Filled 2012-02-27 (×3): qty 1

## 2012-02-27 MED ORDER — ASPIRIN 81 MG PO CHEW: 324.0000 mg | CHEWABLE_TABLET | Freq: Once | ORAL | Status: DC

## 2012-02-27 MED ORDER — NON FORMULARY: 20.0000 mg | Freq: Every day | Status: DC

## 2012-02-27 MED ORDER — ASPIRIN 325 MG PO TABS
325.0000 mg | ORAL_TABLET | Freq: Every day | ORAL | Status: DC
  Filled 2012-02-27 (×2): qty 1

## 2012-02-27 MED ORDER — METOPROLOL TARTRATE 1 MG/ML IV SOLN
5.0000 mg | INTRAVENOUS | Status: DC | PRN
  Filled 2012-02-27: qty 5

## 2012-02-27 MED ORDER — ESCITALOPRAM OXALATE 20 MG PO TABS
20.0000 mg | ORAL_TABLET | Freq: Every day | ORAL | Status: DC
  Administered 2012-02-27 – 2012-02-28 (×2): 20 mg via ORAL
  Filled 2012-02-27 (×3): qty 1

## 2012-02-27 MED ORDER — ACETAMINOPHEN 650 MG RE SUPP: 650.0000 mg | RECTAL | Status: DC | PRN

## 2012-02-27 MED ORDER — ACETAMINOPHEN 325 MG PO TABS: 650.0000 mg | ORAL_TABLET | ORAL | Status: DC | PRN

## 2012-02-27 MED ORDER — SODIUM CHLORIDE 0.9 % IV SOLN
INTRAVENOUS | Status: DC
  Administered 2012-02-27: 12:00:00 via INTRAVENOUS

## 2012-02-27 MED ORDER — ONDANSETRON HCL 4 MG/2ML IJ SOLN: 4.0000 mg | Freq: Four times a day (QID) | INTRAMUSCULAR | Status: DC | PRN

## 2012-02-27 MED ORDER — AMLODIPINE BESYLATE 5 MG PO TABS
5.0000 mg | ORAL_TABLET | Freq: Every day | ORAL | Status: DC
  Filled 2012-02-27 (×2): qty 1

## 2012-02-27 MED ORDER — ACETAMINOPHEN 325 MG PO TABS
650.0000 mg | ORAL_TABLET | ORAL | Status: DC | PRN
  Administered 2012-02-27 – 2012-02-29 (×2): 650 mg via ORAL
  Filled 2012-02-27 (×2): qty 2

## 2012-02-27 NOTE — Consult Note (Signed)
Chief Complaint: Acute pontine stroke, and anterior communicating artery aneurysm.  HPI: Elizabeth [name] is an 71 y.o. female with a history of diabetes mellitus, hypertension and hyperlipidemia who presented to 10 PM hospital with speech difficulty as well as symptoms of vertigo and unsteady gait. She was last known normal at 7 AM this morning. Symptoms were present when she woke up again at 9 AM. Deficits resolved rapidly after arriving at the emergency room. CT scan of her head showed no acute intracranial abnormality. She's been taking aspirin daily. She was not deemed a candidate for TPA because of rapidly resolving deficits. Subsequent MRI study showed left paracentral ischemic pontine infarction. 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  LSN: 7 AM today tPA Given: No: Rapidly resolving deficits MRankin: 0  Past Medical History  Diagnosis Date  . Diarrhea   . Obesity, unspecified   . Other and unspecified hyperlipidemia   . Type II or unspecified type diabetes mellitus without mention of complication, not stated as uncontrolled   . Chronic airway obstruction, not elsewhere classified   . Unspecified asthma   . Unspecified essential hypertension   . Family history of malignant neoplasm of gastrointestinal tract   . Esophageal reflux   . Diaphragmatic hernia without mention of obstruction or gangrene   . Stricture and stenosis of esophagus   . Diverticulosis of colon (without mention of hemorrhage)   . Personal history of colonic polyps   . Active smoker     Family History  Problem Relation Age of Onset  . Colon cancer Other     first cousin, paternal aunt and uncle  . Cancer Paternal Grandmother     gastric  . Breast cancer Maternal Aunt     several paternal cousins  . Diabetes Mother   . Heart disease Mother   . Heart disease Maternal Grandmother   . Thyroid cancer Other     aunt  . Thyroid disease Sister    uncertain type-not cancer     Medications:  Scheduled:   . amLODipine  5 mg Oral Daily  . aspirin  325 mg Oral Daily  . escitalopram  20 mg Oral Daily  . hydrochlorothiazide  12.5 mg Oral Daily  . insulin aspart  0-15 Units Subcutaneous TID WC  . oxybutynin  15 mg Oral Daily  . pantoprazole  40 mg Oral Q1200  . rosuvastatin  20 mg Oral q1800  . DISCONTD: aspirin  324 mg Oral Once  . DISCONTD: NON FORMULARY 20 mg  20 mg Oral Daily   GNF:AOZHYQMVHQION, acetaminophen, acetaminophen, albuterol, ALPRAZolam, ipratropium, metoprolol, ondansetron (ZOFRAN) IV, senna-docusate   Physical Examination: Blood pressure 166/78, pulse 65, temperature 97.9 F (36.6 C), temperature source Oral, resp. rate 18, height 5\' 2"  (1.575 m), weight 85.276 kg (188 lb), SpO2 98.00%.  Neurologic Examination: Mental Status: Alert, oriented, thought content appropriate.  Speech fluent without evidence of aphasia. Able to follow commands without difficulty. Cranial Nerves: II-Visual fields were normal. III/IV/VI-Pupils were equal and reacted. Extraocular movements were full and conjugate.    V/VII-no facial numbness and no facial weakness. VIII-normal. X-normal speech and symmetrical palatal movement. Motor: 5/5 bilaterally with normal tone and bulk Sensory: Normal throughout. Deep Tendon Reflexes: 2+ and symmetric. Plantars: Mute bilaterally Cerebellar: Normal finger-to-nose testing. Carotid auscultation: Normal   Dg Chest 2 View  02/27/2012  *RADIOLOGY REPORT*  Clinical Data: Stroke, hypertension.  CHEST - 2 VIEW  Comparison: 12/15/2010  Findings: Heart and mediastinal contours are within normal limits. No focal opacities or effusions.  No acute bony abnormality.  IMPRESSION: No active cardiopulmonary disease.  Original Report Authenticated By: Cyndie Chime, M.D.   Ct Head Wo Contrast  02/27/2012  *RADIOLOGY REPORT*  Clinical Data: Code Stroke, slurred speech, unable to walk or talk  CT HEAD WITHOUT  CONTRAST  Technique:  Contiguous axial images were obtained from the base of the skull through the vertex without contrast.  Comparison: 03/31/2006  Findings: Mild atrophy. Normal ventricular morphology. No midline shift or mass effect. Small vessel chronic ischemic changes of deep cerebral white matter. Old periventricular white matter infarcts. Scattered small vessel chronic ischemic changes of deep cerebral white matter. No intracranial hemorrhage, mass lesion, or evidence of acute infarction. No extra-axial fluid collections. Posterior fossa unremarkable. Visualized paranasal sinuses and mastoid air cells clear. No acute osseous findings.  IMPRESSION: Atrophy with small vessel chronic ischemic changes of deep cerebral white matter. Old periventricular white matter infarcts. No definite acute intracranial abnormalities.  Findings called to Dr. Deretha Emory on 02/27/2012 at 1120 hrs.  Original Report Authenticated By: Lollie Marrow, M.D.   Mr [facility] Wo Contrast  02/27/2012  *RADIOLOGY REPORT*  Clinical Data:  Difficulty talking. Diabetes.  Obesity.  MRI HEAD WITHOUT CONTRAST MRA HEAD WITHOUT CONTRAST  Technique: Multiplanar, multiecho pulse sequences of the brain and surrounding structures were obtained according to standard protocol without intravenous contrast.  Angiographic images of the head were obtained using MRA technique without contrast.  Comparison: 02/27/2012 CT.  04/11/2010 MR.  MRI HEAD  Findings:  Acute non hemorrhagic left paracentral pontine infarct.  Remote infarcts corona radiata bilaterally.  Remote small thalamic infarcts.  Progressive moderate small vessel disease type changes.  No intracranial mass lesion detected on this unenhanced exam.  Global atrophy without hydrocephalus.  No intracranial hemorrhage.  IMPRESSION: Acute non hemorrhagic left paracentral pontine infarct.  Please see above.  MRA HEAD  Findings: Markedly ectatic vertical cervical segment of the right internal carotid artery.   Focal bulge distal vertical cervical segment left internal carotid artery.  This is probably related to atherosclerotic type changes rather than aneurysm or fibromuscular dysplasia.  Moderate to marked stenosis cavernous segment left internal carotid artery.  Mild narrowing petrous and pre cavernous segment as well as cavernous segment of the right internal carotid artery.  7.8 x 5.8 x 4.9 mm aneurysm anterior communicating artery region.  Moderate middle cerebral artery branch vessel irregularity more notable on the right.  Mild irregularity of the vertebral arteries without high-grade stenosis.  Nonvisualization right PICA.  Mild to moderate irregularity with mild narrowing basilar artery.  Nonvisualization AICAs.  Moderate irregularity involving proximal and distal aspect of the posterior cerebral arteries and superior cerebellar arteries bilaterally.  IMPRESSION: Prominent intracranial atherosclerotic type changes as detailed above.  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  Critical Value/emergent results were called by telephone at the time of interpretation on 02/27/2012  at 2:20 p.m.  to  Dr. Deretha Emory, who verbally acknowledged these results.  Original Report Authenticated By: Fuller Canada, M.D.   Mr Brain Wo Contrast  02/27/2012  *RADIOLOGY REPORT*  Clinical Data:  Difficulty talking. Diabetes.  Obesity.  MRI HEAD WITHOUT CONTRAST MRA HEAD WITHOUT CONTRAST  Technique: Multiplanar, multiecho pulse sequences of the brain and surrounding structures were obtained according to standard protocol without intravenous contrast.  Angiographic images of the head were obtained using MRA technique without contrast.  Comparison: 02/27/2012 CT.  04/11/2010 MR.  MRI HEAD  Findings:  Acute non hemorrhagic left paracentral pontine infarct.  Remote infarcts corona radiata bilaterally.  Remote small thalamic infarcts.  Progressive moderate small vessel disease type changes.  No intracranial mass lesion detected  on this unenhanced exam.  Global atrophy without hydrocephalus.  No intracranial hemorrhage.  IMPRESSION: Acute non hemorrhagic left paracentral pontine infarct.  Please see above.  MRA HEAD  Findings: Markedly ectatic vertical cervical segment of the right internal carotid artery.  Focal bulge distal vertical cervical segment left internal carotid artery.  This is probably related to atherosclerotic type changes rather than aneurysm or fibromuscular dysplasia.  Moderate to marked stenosis cavernous segment left internal carotid artery.  Mild narrowing petrous and pre cavernous segment as well as cavernous segment of the right internal carotid artery.  7.8 x 5.8 x 4.9 mm aneurysm anterior communicating artery region.  Moderate middle cerebral artery branch vessel irregularity more notable on the right.  Mild irregularity of the vertebral arteries without high-grade stenosis.  Nonvisualization right PICA.  Mild to moderate irregularity with mild narrowing basilar artery.  Nonvisualization AICAs.  Moderate irregularity involving proximal and distal aspect of the posterior cerebral arteries and superior cerebellar arteries bilaterally.  IMPRESSION: Prominent intracranial atherosclerotic type changes as detailed above.  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  Critical Value/emergent results were called by telephone at the time of interpretation on 02/27/2012  at 2:20 p.m.  to  Dr. Deretha Emory, who verbally acknowledged these results.  Original Report Authenticated By: Fuller Canada, M.D.    Assessment: 71 y.o. 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 2. 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  Stroke Risk Factors - diabetes mellitus, hyperlipidemia, hypertension and smoking  Plan: 1. HgbA1c, fasting lipid panel 2. Interventional  Radiology consult-Dr. Sharin Grave 3. PT consult, OT consult 4. Echocardiogram 5. Carotid dopplers if catheter angiogram is not elected 6. Prophylactic therapy-{Vasm anticoagulants - Plavix 75 mg per day 7. Risk factor modification 8. Telemetry monitoring  C.R. Roseanne Reno, MD [facility] [phone]  02/27/2012, 8:12 PM

## 2012-02-27 NOTE — H&P (Signed)
History and Physical Examination  Date: 02/27/2012  Patient name: Elizabeth [name] Medical record number: [id number] Date of birth: 1940/09/30 Age: 71 y.o. Gender: female PCP: Rudi Heap, MD  Chief Complaint:  Chief Complaint  Patient presents with  . Code Stroke     History of Present Illness: Elizabeth [name] is an 71 y.o. female who has hypertension and diabetes mellitus and hyperlipidemia who woke up this morning at around 7 AM and then went back to bed and woke up at 9 AM.  She says that she noticed that she was staggering on her feet her vision didn't seem right.  She said she her vision was blurred.  She had slurred speech.  And she had a headache as well.  Her husband noticed these symptoms and recommended that she come to the emergency department.  She went to [facility] emergency room and she was evaluated there.  She had a CT scan done and a code stroke was called.  Patient was evaluated by the telemedicine neurology team.  Her symptoms of slurred speech quickly began to improve in the ED.  She was not deemed a candidate for TPA.  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  The patient was sent to [facility] for further evaluation and management.  Symptomatically the patient has been rapidly improving.  Her speech is improved but not 100% back to normal.   Past Medical History Past Medical History  Diagnosis Date  . Diarrhea   . Obesity, unspecified   . Other and unspecified hyperlipidemia   . Type II or unspecified type diabetes mellitus without mention of complication, not stated as uncontrolled   . Chronic airway obstruction, not elsewhere classified   . Unspecified asthma   . Unspecified essential hypertension   . Family history of malignant neoplasm of gastrointestinal tract   . Esophageal reflux   . Diaphragmatic hernia  without mention of obstruction or gangrene   . Stricture and stenosis of esophagus   . Diverticulosis of colon (without mention of hemorrhage)   . Personal history of colonic polyps   . Active smoker     Past Surgical History Past Surgical History  Procedure Date  . Total abdominal hysterectomy   . Tonsillectomy   . Lumbar disc surgery   . Knee arthroscopy     left  . Appendectomy   . Cardiac catheterization 2006    LAD: 30%, RCA : 20%, normal EF, elevated LVEDP  . Cardiac catheterization     Home Meds: Prior to Admission medications   Medication Sig Start Date End Date Taking? Authorizing Provider  ALPRAZolam (XANAX) 0.25 MG tablet Take 0.25 mg by mouth 2 (two) times daily as needed.    Yes Historical Provider, MD  amLODipine (NORVASC) 10 MG tablet Take 5 mg by mouth daily.    Yes Historical Provider, MD  aspirin 325 MG tablet Take 325 mg by mouth daily.   Yes Historical Provider, MD  diphenhydrAMINE (BENADRYL) 25 MG tablet Take 25 mg by mouth at bedtime as needed.   Yes Historical Provider, MD  ergocalciferol (VITAMIN D2) 50000 UNITS capsule Take 50,000 Units by mouth once a week.   Yes Historical Provider, MD  escitalopram (LEXAPRO) 10 MG tablet Take 20 mg by mouth daily.    Yes Historical Provider, MD  metaxalone (SKELAXIN) 800 MG tablet  Take 800 mg by mouth 2 (two) times daily.   Yes Historical Provider, MD  metoCLOPramide (REGLAN) 10 MG tablet Take 10 mg by mouth 4 (four) times daily.    Yes Historical Provider, MD  omeprazole (PRILOSEC) 20 MG capsule Take 20 mg by mouth daily.     Yes Historical Provider, MD  oxybutynin (DITROPAN XL) 15 MG 24 hr tablet Take 15 mg by mouth daily.     Yes Historical Provider, MD  propoxyphene-acetaminophen (DARVOCET-N 100) 100-650 MG per tablet Take 1 tablet by mouth every 8 (eight) hours as needed. For pain   Yes Historical Provider, MD  rosuvastatin (CRESTOR) 20 MG tablet Take 20 mg by mouth daily.   Yes Historical Provider, MD    sitaGLIPtan-metformin (JANUMET) 50-1000 MG per tablet Take 1 tablet by mouth daily.     Yes Historical Provider, MD  triamterene-hydrochlorothiazide (MAXZIDE) 75-50 MG per tablet Take 1 tablet by mouth daily.   Yes Historical Provider, MD    Allergies: Banana; Benicar; Codeine; Diovan; Latex; Penicillins; Spinach; Sulfonamide derivatives; and Ace inhibitors  Social History:  History   Social History  . Marital Status: Married    Spouse Name: N/A    Number of Children: N/A  . Years of Education: N/A   Occupational History  . RN     [employer]   Social History Main Topics  . Smoking status: Current Everyday Smoker -- 1.0 packs/day for 20 years    Types: Cigarettes    Last Attempt to Quit: 12/17/2010  . Smokeless tobacco: Never Used  . Alcohol Use: No  . Drug Use: No  . Sexually Active: Not on file   Other Topics Concern  . Not on file   Social History Narrative  . No narrative on file   Family History:  Family History  Problem Relation Age of Onset  . Colon cancer Other     first cousin, paternal aunt and uncle  . Cancer Paternal Grandmother     gastric  . Breast cancer Maternal Aunt     several paternal cousins  . Diabetes Mother   . Heart disease Mother   . Heart disease Maternal Grandmother   . Thyroid cancer Other     aunt  . Thyroid disease Sister     uncertain type-not cancer    Review of Systems: Pertinent items are noted in HPI. All other systems reviewed and reported as negative.   Physical Exam: Blood pressure 173/75, pulse 62, temperature 97.9 F (36.6 C), temperature source Oral, resp. rate 18, height 5\' 2"  (1.575 m), weight 85.276 kg (188 lb), SpO2 98.00%. General appearance: alert, cooperative, appears stated age and no distress Head: Normocephalic, mild right facial droop, atraumatic Eyes: conjunctivae/corneas clear. PERRL, EOM's intact. Fundi benign. Nose: Nares normal. Septum midline. Mucosa normal. No drainage or sinus  tenderness., no discharge Throat: lips, mucosa, and tongue normal; teeth and gums normal Neck: no adenopathy, no carotid bruit, no JVD, supple, symmetrical, trachea midline and thyroid not enlarged, symmetric, no tenderness/mass/nodules Lungs: clear to auscultation bilaterally and normal percussion bilaterally Heart: S1, S2 normal Abdomen: soft, non-tender; bowel sounds normal; no masses,  no organomegaly Extremities: extremities normal, atraumatic, no cyanosis or edema Pulses: 2+ and symmetric Lymph nodes: Cervical, supraclavicular, and axillary nodes normal. Neurologic: Grossly normal  Lab  And Imaging results:  Results for orders placed during the hospital encounter of 02/27/12 (from the past 24 hour(s))  GLUCOSE, CAPILLARY     Status: Abnormal   Collection  Time   02/27/12 11:06 AM      Component Value Range   Glucose-Capillary 111 (*) 70 - 99 mg/dL  CBC     Status: Normal   Collection Time   02/27/12 11:20 AM      Component Value Range   WBC 7.0  4.0 - 10.5 K/uL   RBC 4.81  3.87 - 5.11 MIL/uL   Hemoglobin 13.9  12.0 - 15.0 g/dL   HCT 40.9  81.1 - 91.4 %   MCV 83.0  78.0 - 100.0 fL   MCH 28.9  26.0 - 34.0 pg   MCHC 34.8  30.0 - 36.0 g/dL   RDW 78.2  95.6 - 21.3 %   Platelets 283  150 - 400 K/uL  BASIC METABOLIC PANEL     Status: Abnormal   Collection Time   02/27/12 11:20 AM      Component Value Range   Sodium 131 (*) 135 - 145 mEq/L   Potassium 3.6  3.5 - 5.1 mEq/L   Chloride 96  96 - 112 mEq/L   CO2 25  19 - 32 mEq/L   Glucose, Bld 103 (*) 70 - 99 mg/dL   BUN 10  6 - 23 mg/dL   Creatinine, Ser 0.86  0.50 - 1.10 mg/dL   Calcium 9.8  8.4 - 57.8 mg/dL   GFR calc non Af Amer 84 (*) >90 mL/min   GFR calc Af Amer >90  >90 mL/min  PROTIME-INR     Status: Normal   Collection Time   02/27/12 11:20 AM      Component Value Range   Prothrombin Time 14.1  11.6 - 15.2 seconds   INR 1.07  0.00 - 1.49  APTT     Status: Normal   Collection Time   02/27/12 11:20 AM      Component  Value Range   aPTT 32  24 - 37 seconds  CARDIAC PANEL(CRET KIN+CKTOT+MB+TROPI)     Status: Normal   Collection Time   02/27/12 11:20 AM      Component Value Range   Total CK 95  7 - 177 U/L   CK, MB 2.7  0.3 - 4.0 ng/mL   Troponin I <0.30  <0.30 ng/mL   Relative Index RELATIVE INDEX IS INVALID  0.0 - 2.5   Impression  *CVA (cerebral vascular accident)  DM  HYPERLIPIDEMIA  HYPERTENSION  ASTHMA  COPD  GERD  DIVERTICULOSIS, COLON  COLONIC POLYPS, HX OF  Cerebral aneurysm  Tobacco abuse  Slurred speech  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  Gait instability  Vision disturbance S/P CVA (cerebrovascular accident)  Hyponatremia  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  Consult neurology for evaluation and management recommendations.  Ask neurosurgery to evaluate MRI MRA regarding the aneurysm.  Counseled patient extensively on tobacco cessation.  She is willing to try and quit.  She is currently using electronic cigarettes.  Monitor blood glucose check hemoglobin A1c.  Check thyroid function studies.  Monitor blood pressure. Resume home medications for blood pressure.  Discontinue triamterene hydrochlorothiazide because of hyponatremia.  Pt has intolerance to ACEI and ARB.  Resume statin use regularly.  Check lipids.  Check carotid Doppler studies check an echocardiogram.  PT OT consult.  Patient has passed swallow evaluation.  Deferred to neurology for further recommendations.  Please see orders and follow hospital course.    Standley Dakins MD [facility]  [facility] [place], [place] [phone] 02/27/2012, 6:49 PM

## 2012-02-27 NOTE — ED Notes (Signed)
Sitting on sofa this am, became dizzy, had slurred speech.  Alert, talking,

## 2012-02-27 NOTE — ED Notes (Signed)
Patient transported to MRI 

## 2012-02-27 NOTE — ED Notes (Signed)
Pt states she took 2 enteric coated aspirin this am.

## 2012-02-27 NOTE — ED Notes (Signed)
Pt is currently eating a burger and drinking a diet coke.

## 2012-02-27 NOTE — ED Provider Notes (Signed)
History    This chart was scribed for Elizabeth Jakes, MD, MD by Smitty Pluck. The patient was seen in room APA02 and the patient's care was started at 11:03AM.   CSN: [id number]  Arrival date & time 02/27/12  1052   First MD Initiated Contact with Patient 02/27/12 1058      Chief Complaint  Patient presents with  . Code Stroke    (Consider location/radiation/quality/duration/timing/severity/associated sxs/prior treatment) The history is provided by the patient.   SHAILEY [name] is a 71 y.o. female who presents to the Emergency Department complaining of possible stroke onset 2 hours ago today. Pt reports being dizzy and not being unable to walk. She had slurred speech. She reports that she still has slurred speech. She reports that the dizziness has subsided by since she has been laying down. Pt is alert and talking.   Past Medical History  Diagnosis Date  . Diarrhea   . Obesity, unspecified   . Other and unspecified hyperlipidemia   . Type II or unspecified type diabetes mellitus without mention of complication, not stated as uncontrolled   . Chronic airway obstruction, not elsewhere classified   . Unspecified asthma   . Unspecified essential hypertension   . Family history of malignant neoplasm of gastrointestinal tract   . Esophageal reflux   . Diaphragmatic hernia without mention of obstruction or gangrene   . Stricture and stenosis of esophagus   . Diverticulosis of colon (without mention of hemorrhage)   . Personal history of colonic polyps     Past Surgical History  Procedure Date  . Total abdominal hysterectomy   . Tonsillectomy   . Lumbar disc surgery   . Knee arthroscopy     left  . Appendectomy   . Cardiac catheterization 2006    LAD: 30%, RCA : 20%, normal EF, elevated LVEDP  . Cardiac catheterization     Family History  Problem Relation Age of Onset  . Colon cancer Other     first cousin, paternal aunt and uncle  . Cancer Paternal Grandmother    gastric  . Breast cancer Maternal Aunt     several paternal cousins  . Diabetes Mother   . Heart disease Mother   . Heart disease Maternal Grandmother   . Thyroid cancer Other     aunt  . Thyroid disease Sister     uncertain type-not cancer    History  Substance Use Topics  . Smoking status: Current Everyday Smoker -- 1.0 packs/day for 20 years    Types: Cigarettes    Last Attempt to Quit: 12/17/2010  . Smokeless tobacco: Never Used  . Alcohol Use: No    OB History    Grav Para Term Preterm Abortions TAB SAB Ect Mult Living                  Review of Systems  Allergies  Banana; Benicar; Codeine; Diovan; Latex; Penicillins; Sulfonamide derivatives; and Ace inhibitors  Home Medications   Current Outpatient Rx  Name Route Sig Dispense Refill  . ALPRAZOLAM 0.25 MG PO TABS Oral Take 0.25 mg by mouth 2 (two) times daily as needed.     Marland Kitchen AMLODIPINE BESYLATE 10 MG PO TABS Oral Take 5 mg by mouth daily.     . ASPIRIN 325 MG PO TABS Oral Take 325 mg by mouth daily.    Marland Kitchen DIPHENHYDRAMINE HCL 25 MG PO TABS Oral Take 25 mg by mouth at bedtime as needed.    Marland Kitchen  ERGOCALCIFEROL 50000 UNITS PO CAPS Oral Take 50,000 Units by mouth once a week.    Marland Kitchen ESCITALOPRAM OXALATE 10 MG PO TABS Oral Take 20 mg by mouth daily.     Marland Kitchen METAXALONE 800 MG PO TABS Oral Take 800 mg by mouth 2 (two) times daily.    Marland Kitchen METOCLOPRAMIDE HCL 10 MG PO TABS Oral Take 10 mg by mouth 4 (four) times daily.     Marland Kitchen OMEPRAZOLE 20 MG PO CPDR Oral Take 20 mg by mouth daily.      . OXYBUTYNIN CHLORIDE ER 15 MG PO TB24 Oral Take 15 mg by mouth daily.      Marland Kitchen PROPOXYPHENE N-ACETAMINOPHEN 100-650 MG PO TABS Oral Take 1 tablet by mouth every 8 (eight) hours as needed. For pain    . ROSUVASTATIN CALCIUM 20 MG PO TABS Oral Take 20 mg by mouth daily.    Marland Kitchen SITAGLIPTIN-METFORMIN HCL 50-1000 MG PO TABS Oral Take 1 tablet by mouth daily.      . TRIAMTERENE-HCTZ 75-50 MG PO TABS Oral Take 1 tablet by mouth daily.      BP 142/81  Pulse  63  Temp 97.8 F (36.6 C) (Oral)  Resp 17  Ht 5\' 2"  (1.575 m)  Wt 188 lb (85.276 kg)  BMI 34.39 kg/m2  SpO2 97%  Physical Exam  Nursing note and vitals reviewed. Constitutional: She is oriented to person, place, and time. She appears well-developed and well-nourished.  HENT:  Head: Normocephalic and atraumatic.  Eyes: EOM are normal. Pupils are equal, round, and reactive to light.  Cardiovascular: Normal rate, regular rhythm and normal heart sounds.   Pulmonary/Chest: Effort normal and breath sounds normal. No respiratory distress.  Abdominal: Soft. Bowel sounds are normal. There is no tenderness.  Neurological: She is alert and oriented to person, place, and time. No cranial nerve deficit. Coordination normal.       Slurred speech  Skin: Skin is warm and dry.  Psychiatric: She has a normal mood and affect. Her behavior is normal.    ED Course  Procedures (including critical care time) DIAGNOSTIC STUDIES: Oxygen Saturation is 97% on room air, normal by my interpretation.    COORDINATION OF CARE: 11:10AM EDP discusses pt ED treatment with pt 11:22AM EDP recheck: Pt's speech is improving. Pt's husband reports it almost being back to nl.   Labs Reviewed  GLUCOSE, CAPILLARY - Abnormal; Notable for the following:    Glucose-Capillary 111 (*)     All other components within normal limits  BASIC METABOLIC PANEL - Abnormal; Notable for the following:    Sodium 131 (*)     Glucose, Bld 103 (*)     GFR calc non Af Amer 84 (*)     All other components within normal limits  CBC  PROTIME-INR  APTT  CARDIAC PANEL(CRET KIN+CKTOT+MB+TROPI)   Ct Head Wo Contrast  02/27/2012  *RADIOLOGY REPORT*  Clinical Data: Code Stroke, slurred speech, unable to walk or talk  CT HEAD WITHOUT CONTRAST  Technique:  Contiguous axial images were obtained from the base of the skull through the vertex without contrast.  Comparison: 03/31/2006  Findings: Mild atrophy. Normal ventricular morphology. No  midline shift or mass effect. Small vessel chronic ischemic changes of deep cerebral white matter. Old periventricular white matter infarcts. Scattered small vessel chronic ischemic changes of deep cerebral white matter. No intracranial hemorrhage, mass lesion, or evidence of acute infarction. No extra-axial fluid collections. Posterior fossa unremarkable. Visualized paranasal sinuses and mastoid air cells  clear. No acute osseous findings.  IMPRESSION: Atrophy with small vessel chronic ischemic changes of deep cerebral white matter. Old periventricular white matter infarcts. No definite acute intracranial abnormalities.  Findings called to Dr. Deretha Emory on 02/27/2012 at 1120 hrs.  Original Report Authenticated By: Lollie Marrow, M.D.    Date: 02/27/2012  Rate: 60  Rhythm: normal sinus rhythm  QRS Axis: normal  Intervals: normal  ST/T Wave abnormalities: normal  Conduction Disutrbances:right bundle branch block  Narrative Interpretation:   Old EKG Reviewed: none available  Results for orders placed during the hospital encounter of 02/27/12  GLUCOSE, CAPILLARY      Component Value Range   Glucose-Capillary 111 (*) 70 - 99 mg/dL  CBC      Component Value Range   WBC 7.0  4.0 - 10.5 K/uL   RBC 4.81  3.87 - 5.11 MIL/uL   Hemoglobin 13.9  12.0 - 15.0 g/dL   HCT 96.0  45.4 - 09.8 %   MCV 83.0  78.0 - 100.0 fL   MCH 28.9  26.0 - 34.0 pg   MCHC 34.8  30.0 - 36.0 g/dL   RDW 11.9  14.7 - 82.9 %   Platelets 283  150 - 400 K/uL  BASIC METABOLIC PANEL      Component Value Range   Sodium 131 (*) 135 - 145 mEq/L   Potassium 3.6  3.5 - 5.1 mEq/L   Chloride 96  96 - 112 mEq/L   CO2 25  19 - 32 mEq/L   Glucose, Bld 103 (*) 70 - 99 mg/dL   BUN 10  6 - 23 mg/dL   Creatinine, Ser 5.62  0.50 - 1.10 mg/dL   Calcium 9.8  8.4 - 13.0 mg/dL   GFR calc non Af Amer 84 (*) >90 mL/min   GFR calc Af Amer >90  >90 mL/min  PROTIME-INR      Component Value Range   Prothrombin Time 14.1  11.6 - 15.2 seconds    INR 1.07  0.00 - 1.49  APTT      Component Value Range   aPTT 32  24 - 37 seconds  CARDIAC PANEL(CRET KIN+CKTOT+MB+TROPI)      Component Value Range   Total CK 95  7 - 177 U/L   CK, MB 2.7  0.3 - 4.0 ng/mL   Troponin I <0.30  <0.30 ng/mL   Relative Index RELATIVE INDEX IS INVALID  0.0 - 2.5   Summary of MRI MRA results. Formal notation by radiologist is still pending it shows an acute non-REM or hemorrhagic left pontine infarct as well it shows an aneurysm at the communicating artery anterior to measure 7.8 5.8 x 4.9 mm.  1. TIA (transient ischemic attack)      CRITICAL CARE Performed by: Elizabeth [name].   Total critical care time: 30  Critical care time was exclusive of separately billable procedures and treating other patients.  Critical care was necessary to treat or prevent imminent or life-threatening deterioration.  Critical care was time spent personally by me on the following activities: development of treatment plan with patient and/or surrogate as well as nursing, discussions with consultants, evaluation of patient's response to treatment, examination of patient, obtaining history from patient or surrogate, ordering and performing treatments and interventions, ordering and review of laboratory studies, ordering and review of radiographic studies, pulse oximetry and re-evaluation of patient's condition.  MDM  Code stroke initiated patient sent immediately back to head CT which was negative. Patient had slurred speech  upon arrival onset of symptoms were at 9 this morning. Also associated with dizziness and some gait problems. Upon arriving back from head CT speech was improving.telemetry neurological consult appropriately recommended no TPA since patient was improving sniffing when bedtime he was done with her exam speech was back to normal. We'll get MR MRA of brain head for further evaluation and admission for TIA.  Discussed with hospitalist here they recommend admission  [facility]: Hospitalist Dr. Laural Benes they will accept care for the acute stroke should be admitted to a telemetry bed transfer by [facility]. Here patient has been stable speech according to husband is completely back to normal but certainly about 95-98% back to normal.    I personally performed the services described in this documentation, which was scribed in my presence. The recorded information has been reviewed and considered.     Elizabeth Jakes, MD 02/27/12 1538

## 2012-02-27 NOTE — ED Notes (Signed)
Teleneurology consult done.

## 2012-02-28 ENCOUNTER — Encounter (HOSPITAL_COMMUNITY): Payer: Self-pay

## 2012-02-28 DIAGNOSIS — I671 Cerebral aneurysm, nonruptured: Secondary | ICD-10-CM

## 2012-02-28 DIAGNOSIS — E119 Type 2 diabetes mellitus without complications: Secondary | ICD-10-CM

## 2012-02-28 DIAGNOSIS — I6340 Cerebral infarction due to embolism of unspecified cerebral artery: Secondary | ICD-10-CM

## 2012-02-28 DIAGNOSIS — I10 Essential (primary) hypertension: Secondary | ICD-10-CM

## 2012-02-28 DIAGNOSIS — G459 Transient cerebral ischemic attack, unspecified: Secondary | ICD-10-CM

## 2012-02-28 LAB — BASIC METABOLIC PANEL
BUN: 13 mg/dL (ref 6–23)
CO2: 24 mEq/L (ref 19–32)
Calcium: 9 mg/dL (ref 8.4–10.5)
Chloride: 102 mEq/L (ref 96–112)
Creatinine, Ser: 0.77 mg/dL (ref 0.50–1.10)
GFR calc Af Amer: >90 mL/min (ref >90)
GFR calc non Af Amer: 84 mL/min — ABNORMAL LOW (ref >90)
Glucose, Bld: 110 mg/dL — ABNORMAL HIGH (ref 70–99)
Potassium: 3.6 mEq/L (ref 3.5–5.1)
Sodium: 137 mEq/L (ref 135–145)

## 2012-02-28 LAB — HEMOGLOBIN A1C
Hgb A1c MFr Bld: 6 % — ABNORMAL HIGH (ref <5.7)
Hgb A1c MFr Bld: 6.3 % — ABNORMAL HIGH (ref <5.7)
Mean Plasma Glucose: 126 mg/dL — ABNORMAL HIGH (ref <117)
Mean Plasma Glucose: 134 mg/dL — ABNORMAL HIGH (ref <117)

## 2012-02-28 LAB — GLUCOSE, CAPILLARY
Glucose-Capillary: 106 mg/dL — ABNORMAL HIGH (ref 70–99)
Glucose-Capillary: 100 mg/dL — ABNORMAL HIGH (ref 70–99)
Glucose-Capillary: 99 mg/dL (ref 70–99)
Glucose-Capillary: 167 mg/dL — ABNORMAL HIGH (ref 70–99)

## 2012-02-28 LAB — LIPID PANEL
Cholesterol: 169 mg/dL (ref 0–200)
HDL: 49 mg/dL (ref >39)
LDL Cholesterol: 95 mg/dL (ref 0–99)
Total CHOL/HDL Ratio: 3.4 RATIO
Triglycerides: 125 mg/dL (ref <150)
VLDL: 25 mg/dL (ref 0–40)

## 2012-02-28 LAB — CARDIAC PANEL(CRET KIN+CKTOT+MB+TROPI)
CK, MB: 2.9 ng/mL (ref 0.3–4.0)
CK, MB: 2.8 ng/mL (ref 0.3–4.0)
Relative Index: 2.8 — ABNORMAL HIGH (ref 0.0–2.5)
Relative Index: 2.6 — ABNORMAL HIGH (ref 0.0–2.5)
Total CK: 104 U/L (ref 7–177)
Total CK: 106 U/L (ref 7–177)
Troponin I: <0.3 ng/mL (ref <0.30)
Troponin I: <0.3 ng/mL (ref <0.30)

## 2012-02-28 MED ORDER — CLOPIDOGREL BISULFATE 75 MG PO TABS
75.0000 mg | ORAL_TABLET | Freq: Every day | ORAL | Status: DC
  Administered 2012-02-28 – 2012-02-29 (×2): 75 mg via ORAL
  Filled 2012-02-28 (×2): qty 1

## 2012-02-28 MED ORDER — AMLODIPINE BESYLATE 10 MG PO TABS
10.0000 mg | ORAL_TABLET | Freq: Every day | ORAL | Status: DC
  Administered 2012-02-28 – 2012-02-29 (×2): 10 mg via ORAL
  Filled 2012-02-28 (×2): qty 1

## 2012-02-28 MED ORDER — SODIUM CHLORIDE 0.9 % IV SOLN
INTRAVENOUS | Status: DC
  Administered 2012-02-28: 19:00:00 via INTRAVENOUS

## 2012-02-28 NOTE — Progress Notes (Signed)
Patient ID: CORVETTE ORSER, female   DOB: 08/02/1941, 70 y.o.   MRN: [id number] Stroke Team Progress Note  HISTORY LALITHA ILYAS is an 71 y.o. female with a history of diabetes mellitus, hypertension and hyperlipidemia who presented to 10 PM hospital with speech difficulty as well as symptoms of vertigo and unsteady gait. She was last known normal at 7 AM this morning. Symptoms were present when she woke up again at 9 AM. Deficits resolved rapidly after arriving at the emergency room. CT scan of her head showed no acute intracranial abnormality. She's been taking aspirin daily. She was not deemed a candidate for TPA because of rapidly resolving deficits. Subsequent MRI study showed left paracentral ischemic pontine infarction. 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  SUBJECTIVE Patient is doing well this morning. She continues to notice some slight slurring of her speech but otherwise feels near baseline.  OBJECTIVE Most recent Vital Signs: Temp: 97.9 F (36.6 C) (06/22 0550) Temp src: Oral (06/22 0550) BP: 135/82 mmHg (06/22 0630) Pulse Rate: 67  (06/22 0550) Respiratory Rate: 18 O2 Saturation: 100%  CBG (last 3)  Basename 02/28/12 0702 02/27/12 2223 02/27/12 1106  GLUCAP 106* 99 111*   Intake/Output from previous day: 06/21 0701 - 06/22 0700 In: 1392.5 [P.O.:685; I.V.:707.5] Out: -   IV Fluid Intake:     . DISCONTD: sodium chloride 100 mL/hr at 02/27/12 1135  . DISCONTD: sodium chloride 75 mL/hr at 02/27/12 2102   Medications    . amLODipine  10 mg Oral Daily  . clopidogrel  75 mg Oral Q breakfast  . escitalopram  20 mg Oral Daily  . hydrochlorothiazide  12.5 mg Oral Daily  . insulin aspart  0-15 Units Subcutaneous TID WC  . oxybutynin  15 mg Oral Daily  . pantoprazole  40 mg Oral Q1200  . rosuvastatin  20 mg Oral q1800  . DISCONTD: amLODipine  5 mg Oral Daily  . DISCONTD: aspirin  324 mg Oral Once  . DISCONTD:  aspirin  325 mg Oral Daily  . DISCONTD: NON FORMULARY 20 mg  20 mg Oral Daily  PRN acetaminophen, acetaminophen, acetaminophen, albuterol, ALPRAZolam, ipratropium, metoprolol, ondansetron (ZOFRAN) IV, senna-docusate  Diet:  Carb Control think liquids Activity:  Bathroom privileges; Up with assistance. DVT Prophylaxis:  SCDs  Studies: CBC    Component Value Date/Time   WBC 7.0 02/27/2012 1120   RBC 4.81 02/27/2012 1120   HGB 13.9 02/27/2012 1120   HCT 39.9 02/27/2012 1120   PLT 283 02/27/2012 1120   MCV 83.0 02/27/2012 1120   MCH 28.9 02/27/2012 1120   MCHC 34.8 02/27/2012 1120   RDW 13.2 02/27/2012 1120   MONOABS 0.6 08/29/2008 1349   EOSABS 0.2 08/29/2008 1349   BASOSABS 0.1 08/29/2008 1349   CMP    Component Value Date/Time   NA 137 02/28/2012 0500   K 3.6 02/28/2012 0500   CL 102 02/28/2012 0500   CO2 24 02/28/2012 0500   GLUCOSE 110* 02/28/2012 0500   BUN 13 02/28/2012 0500   CREATININE 0.77 02/28/2012 0500   CALCIUM 9.0 02/28/2012 0500   PROT 6.7 08/29/2008 1349   ALBUMIN 3.9 08/29/2008 1349   AST 21 08/29/2008 1349   ALT 20 08/29/2008 1349   ALKPHOS 74 08/29/2008 1349   BILITOT 0.7 08/29/2008 1349   GFRNONAA 84* 02/28/2012 0500   GFRAA >90 02/28/2012 0500   COAGS Lab Results  Component Value Date  INR 1.07 02/27/2012   Lipid Panel    Component Value Date/Time   CHOL 169 02/28/2012 0500   TRIG 125 02/28/2012 0500   HDL 49 02/28/2012 0500   CHOLHDL 3.4 02/28/2012 0500   VLDL 25 02/28/2012 0500   LDLCALC 95 02/28/2012 0500   HgbA1C  Lab Results  Component Value Date   HGBA1C 6.0* 02/27/2012   Urine Drug Screen  No results found for this basename: labopia, cocainscrnur, labbenz, amphetmu, thcu, labbarb    Alcohol Level No results found for this basename: eth     Results for orders placed during the hospital encounter of 02/27/12 (from the past 24 hour(s))  GLUCOSE, CAPILLARY     Status: Abnormal   Collection Time   02/27/12 11:06 AM      Component Value Range    Glucose-Capillary 111 (*) 70 - 99 mg/dL  CBC     Status: Normal   Collection Time   02/27/12 11:20 AM      Component Value Range   WBC 7.0  4.0 - 10.5 K/uL   RBC 4.81  3.87 - 5.11 MIL/uL   Hemoglobin 13.9  12.0 - 15.0 g/dL   HCT 14.7  82.9 - 56.2 %   MCV 83.0  78.0 - 100.0 fL   MCH 28.9  26.0 - 34.0 pg   MCHC 34.8  30.0 - 36.0 g/dL   RDW 13.0  86.5 - 78.4 %   Platelets 283  150 - 400 K/uL  BASIC METABOLIC PANEL     Status: Abnormal   Collection Time   02/27/12 11:20 AM      Component Value Range   Sodium 131 (*) 135 - 145 mEq/L   Potassium 3.6  3.5 - 5.1 mEq/L   Chloride 96  96 - 112 mEq/L   CO2 25  19 - 32 mEq/L   Glucose, Bld 103 (*) 70 - 99 mg/dL   BUN 10  6 - 23 mg/dL   Creatinine, Ser 6.96  0.50 - 1.10 mg/dL   Calcium 9.8  8.4 - 29.5 mg/dL   GFR calc non Af Amer 84 (*) >90 mL/min   GFR calc Af Amer >90  >90 mL/min  PROTIME-INR     Status: Normal   Collection Time   02/27/12 11:20 AM      Component Value Range   Prothrombin Time 14.1  11.6 - 15.2 seconds   INR 1.07  0.00 - 1.49  APTT     Status: Normal   Collection Time   02/27/12 11:20 AM      Component Value Range   aPTT 32  24 - 37 seconds  CARDIAC PANEL(CRET KIN+CKTOT+MB+TROPI)     Status: Normal   Collection Time   02/27/12 11:20 AM      Component Value Range   Total CK 95  7 - 177 U/L   CK, MB 2.7  0.3 - 4.0 ng/mL   Troponin I <0.30  <0.30 ng/mL   Relative Index RELATIVE INDEX IS INVALID  0.0 - 2.5  CARDIAC PANEL(CRET KIN+CKTOT+MB+TROPI)     Status: Abnormal   Collection Time   02/27/12  7:24 PM      Component Value Range   Total CK 112  7 - 177 U/L   CK, MB 3.0  0.3 - 4.0 ng/mL   Troponin I <0.30  <0.30 ng/mL   Relative Index 2.7 (*) 0.0 - 2.5  HEMOGLOBIN A1C     Status: Abnormal   Collection Time  02/27/12  7:25 PM      Component Value Range   Hemoglobin A1C 6.0 (*) <5.7 %   Mean Plasma Glucose 126 (*) <117 mg/dL  GLUCOSE, CAPILLARY     Status: Normal   Collection Time   02/27/12 10:23 PM       Component Value Range   Glucose-Capillary 99  70 - 99 mg/dL  CARDIAC PANEL(CRET KIN+CKTOT+MB+TROPI)     Status: Abnormal   Collection Time   02/28/12  2:59 AM      Component Value Range   Total CK 104  7 - 177 U/L   CK, MB 2.9  0.3 - 4.0 ng/mL   Troponin I <0.30  <0.30 ng/mL   Relative Index 2.8 (*) 0.0 - 2.5  LIPID PANEL     Status: Normal   Collection Time   02/28/12  5:00 AM      Component Value Range   Cholesterol 169  0 - 200 mg/dL   Triglycerides 161  <096 mg/dL   HDL 49  >04 mg/dL   Total CHOL/HDL Ratio 3.4     VLDL 25  0 - 40 mg/dL   LDL Cholesterol 95  0 - 99 mg/dL  BASIC METABOLIC PANEL     Status: Abnormal   Collection Time   02/28/12  5:00 AM      Component Value Range   Sodium 137  135 - 145 mEq/L   Potassium 3.6  3.5 - 5.1 mEq/L   Chloride 102  96 - 112 mEq/L   CO2 24  19 - 32 mEq/L   Glucose, Bld 110 (*) 70 - 99 mg/dL   BUN 13  6 - 23 mg/dL   Creatinine, Ser 5.40  0.50 - 1.10 mg/dL   Calcium 9.0  8.4 - 98.1 mg/dL   GFR calc non Af Amer 84 (*) >90 mL/min   GFR calc Af Amer >90  >90 mL/min  GLUCOSE, CAPILLARY     Status: Abnormal   Collection Time   02/28/12  7:02 AM      Component Value Range   Glucose-Capillary 106 (*) 70 - 99 mg/dL   Comment 1 Documented in Chart     Comment 2 Notify RN      Dg Chest 2 View  02/27/2012  *RADIOLOGY REPORT*  Clinical Data: Stroke, hypertension.  CHEST - 2 VIEW  Comparison: 12/15/2010  Findings: Heart and mediastinal contours are within normal limits. No focal opacities or effusions.  No acute bony abnormality.  IMPRESSION: No active cardiopulmonary disease.  Original Report Authenticated By: Cyndie Chime, M.D.   Ct Head Wo Contrast  02/27/2012  *RADIOLOGY REPORT*  Clinical Data: Code Stroke, slurred speech, unable to walk or talk  CT HEAD WITHOUT CONTRAST  Technique:  Contiguous axial images were obtained from the base of the skull through the vertex without contrast.  Comparison: 03/31/2006  Findings: Mild atrophy. Normal  ventricular morphology. No midline shift or mass effect. Small vessel chronic ischemic changes of deep cerebral white matter. Old periventricular white matter infarcts. Scattered small vessel chronic ischemic changes of deep cerebral white matter. No intracranial hemorrhage, mass lesion, or evidence of acute infarction. No extra-axial fluid collections. Posterior fossa unremarkable. Visualized paranasal sinuses and mastoid air cells clear. No acute osseous findings.  IMPRESSION: Atrophy with small vessel chronic ischemic changes of deep cerebral white matter. Old periventricular white matter infarcts. No definite acute intracranial abnormalities.  Findings called to Dr. Deretha Emory on 02/27/2012 at 1120 hrs.  Original Report  Authenticated By: Lollie Marrow, M.D.   Mr [facility] Wo Contrast  02/27/2012  *RADIOLOGY REPORT*  Clinical Data:  Difficulty talking. Diabetes.  Obesity.  MRI HEAD WITHOUT CONTRAST MRA HEAD WITHOUT CONTRAST  Technique: Multiplanar, multiecho pulse sequences of the brain and surrounding structures were obtained according to standard protocol without intravenous contrast.  Angiographic images of the head were obtained using MRA technique without contrast.  Comparison: 02/27/2012 CT.  04/11/2010 MR.  MRI HEAD  Findings:  Acute non hemorrhagic left paracentral pontine infarct.  Remote infarcts corona radiata bilaterally.  Remote small thalamic infarcts.  Progressive moderate small vessel disease type changes.  No intracranial mass lesion detected on this unenhanced exam.  Global atrophy without hydrocephalus.  No intracranial hemorrhage.  IMPRESSION: Acute non hemorrhagic left paracentral pontine infarct.  Please see above.  MRA HEAD  Findings: Markedly ectatic vertical cervical segment of the right internal carotid artery.  Focal bulge distal vertical cervical segment left internal carotid artery.  This is probably related to atherosclerotic type changes rather than aneurysm or fibromuscular  dysplasia.  Moderate to marked stenosis cavernous segment left internal carotid artery.  Mild narrowing petrous and pre cavernous segment as well as cavernous segment of the right internal carotid artery.  7.8 x 5.8 x 4.9 mm aneurysm anterior communicating artery region.  Moderate middle cerebral artery branch vessel irregularity more notable on the right.  Mild irregularity of the vertebral arteries without high-grade stenosis.  Nonvisualization right PICA.  Mild to moderate irregularity with mild narrowing basilar artery.  Nonvisualization AICAs.  Moderate irregularity involving proximal and distal aspect of the posterior cerebral arteries and superior cerebellar arteries bilaterally.  IMPRESSION: Prominent intracranial atherosclerotic type changes as detailed above.  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  Critical Value/emergent results were called by telephone at the time of interpretation on 02/27/2012  at 2:20 p.m.  to  Dr. Deretha Emory, who verbally acknowledged these results.  Original Report Authenticated By: Fuller Canada, M.D.   Mr Brain Wo Contrast  02/27/2012  *RADIOLOGY REPORT*  Clinical Data:  Difficulty talking. Diabetes.  Obesity.  MRI HEAD WITHOUT CONTRAST MRA HEAD WITHOUT CONTRAST  Technique: Multiplanar, multiecho pulse sequences of the brain and surrounding structures were obtained according to standard protocol without intravenous contrast.  Angiographic images of the head were obtained using MRA technique without contrast.  Comparison: 02/27/2012 CT.  04/11/2010 MR.  MRI HEAD  Findings:  Acute non hemorrhagic left paracentral pontine infarct.  Remote infarcts corona radiata bilaterally.  Remote small thalamic infarcts.  Progressive moderate small vessel disease type changes.  No intracranial mass lesion detected on this unenhanced exam.  Global atrophy without hydrocephalus.  No intracranial hemorrhage.  IMPRESSION: Acute non hemorrhagic left paracentral pontine infarct.  Please see  above.  MRA HEAD  Findings: Markedly ectatic vertical cervical segment of the right internal carotid artery.  Focal bulge distal vertical cervical segment left internal carotid artery.  This is probably related to atherosclerotic type changes rather than aneurysm or fibromuscular dysplasia.  Moderate to marked stenosis cavernous segment left internal carotid artery.  Mild narrowing petrous and pre cavernous segment as well as cavernous segment of the right internal carotid artery.  7.8 x 5.8 x 4.9 mm aneurysm anterior communicating artery region.  Moderate middle cerebral artery branch vessel irregularity more notable on the right.  Mild irregularity of the vertebral arteries without high-grade stenosis.  Nonvisualization right PICA.  Mild to moderate irregularity with mild narrowing basilar artery.  Nonvisualization AICAs.  Moderate  irregularity involving proximal and distal aspect of the posterior cerebral arteries and superior cerebellar arteries bilaterally.  IMPRESSION: Prominent intracranial atherosclerotic type changes as detailed above.  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  Critical Value/emergent results were called by telephone at the time of interpretation on 02/27/2012  at 2:20 p.m.  to  Dr. Deretha Emory, who verbally acknowledged these results.  Original Report Authenticated By: Fuller Canada, M.D.   2D Echocardiogram  pending  Physical Exam   GENERAL:   Well nourished, well hydrated, no acute distress.   CARDIOVASCULAR:   Regular rate and rhythm, no thrills or palpable murmurs, S1, S2, no murmur, no rubs or gallops.      Carotid arteries: No carotid bruits.   RESPIRATORY:  Clear to auscultation bilaterally, no wheezes, rhonci or rales  MENTAL STATUS EXAM:    Orientation:  Alert and oriented to person, place and time.      Language:  Speech is minimally dysarthric and language is normal.   CRANIAL NERVES:     CN 2 (Optic):  Visual fields intact to confrontation.     CN 3,4,6  (EOM):  Pupils equal and reactive to light and near full eye movement without nystagmus.      CN 5 (Trigeminal):  Facial sensation is normal, no weakness of masticatory muscles.      CN 7 (Facial):  No facial weakness or asymmetry.      CN 8 (Auditory):  Auditory acuity grossly normal.      CN 9,10 (Glossophar):  The uvula is midline, the palate elevates symmetrically.      CN 11 (spinal access):  Normal sternocleidomastoid and trapezius strength.      CN 12 (Hypoglossal):  The tongue is midline. No atrophy or fasciculations.   MOTOR:    Full and symmetric strength.  REFLEXES:     Reflexes are intact. Plantars down.  COORDINATION:     Intact finger-to-nose, heel-to-shin, and rapid alternating movements.  SENSATION:     Intact to touch.  GAIT:     Deferred.  ASSESSMENT 71 y.o. 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  2. 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  Stroke Risk Factors - diabetes mellitus, hyperlipidemia, hypertension and smoking   Hospital day # 1  TREATMENT/PLAN -HgbA1c pending  -Spoke with Dr. Corliss Skains. NPO after MN pending diagnostic angio in am. Started hydrating today with 75cc/hr NS. He will make further decisions regarding treatment of her aneurysm based on angio. -PT consult, OT consult  -Echocardiogram  -Continue prophylactic therapy- Plavix 75 mg per day  -Risk factor modification  -Telemetry monitoring  Kipp Laurence, MD [facility] Pager: [phone] 02/28/2012 9:40 AM

## 2012-02-28 NOTE — Evaluation (Signed)
Physical Therapy Evaluation Patient Details Name: Elizabeth [name] MRN: [id number] DOB: December 21, 1940 Today's Date: 02/28/2012 Time: 2841-3244 PT Time Calculation (min): 20 min  PT Assessment / Plan / Recommendation Clinical Impression  Pt presents with a medical diagnosis of acute non hemorrhagic left paracentral pontine infarct. Pt with visual deficits which may be the contributing factor to high level balance deficits. Pt will benefit from skilled PT in the acute care setting in order to maximize functional mobility, balance and safety prior to d/c    PT Assessment  Patient needs continued PT services    Follow Up Recommendations  Outpatient PT;Supervision for mobility/OOB (possible No PT follow up pending progress)    Barriers to Discharge        lEquipment Recommendations  None recommended by PT    Recommendations for Other Services     Frequency Min 4X/week    Precautions / Restrictions Precautions Precautions: Fall Restrictions Weight Bearing Restrictions: No         Mobility  Bed Mobility Bed Mobility: Supine to Sit;Sitting - Scoot to Edge of Bed Supine to Sit: 6: Modified independent (Device/Increase time) Sitting - Scoot to Edge of Bed: 6: Modified independent (Device/Increase time) Transfers Transfers: Sit to Stand;Stand to Sit Sit to Stand: 4: Min guard;With upper extremity assist;From bed Stand to Sit: 4: Min guard;With upper extremity assist;To bed Details for Transfer Assistance: Minguard for safety as pt with slight balance loss upon standing in which she was able to correct.  Ambulation/Gait Ambulation/Gait Assistance: 4: Min guard Ambulation Distance (Feet): 150 Feet Assistive device: None Ambulation/Gait Assistance Details: Minguard for safety during ambulation. Pt with right deviation as well as slight right lean during ambulation. Pt able to step over objects as well as weave through with no difficulties. Pt has had vision deficits which could possibly be  the cause for slight balance loss. Gait Pattern: Step-to pattern;Lateral trunk lean to right;Narrow base of support Gait velocity: decreased gait speed Modified Rankin (Stroke Patients Only) Pre-Morbid Rankin Score: No symptoms Modified Rankin: Moderate disability    Exercises     PT Diagnosis: Difficulty walking  PT Problem List: Decreased activity tolerance;Decreased balance;Decreased mobility;Decreased knowledge of use of DME;Decreased safety awareness;Decreased knowledge of precautions PT Treatment Interventions: DME instruction;Gait training;Stair training;Functional mobility training;Therapeutic activities;Neuromuscular re-education;Patient/family education   PT Goals Acute Rehab PT Goals PT Goal Formulation: With patient Time For Goal Achievement: 03/06/12 Potential to Achieve Goals: Good Pt will go Sit to Stand: with modified independence PT Goal: Sit to Stand - Progress: Goal set today Pt will go Stand to Sit: with modified independence PT Goal: Stand to Sit - Progress: Goal set today Pt will Transfer Bed to Chair/Chair to Bed: with modified independence PT Transfer Goal: Bed to Chair/Chair to Bed - Progress: Goal set today Pt will Ambulate: >150 feet;with modified independence;with least restrictive assistive device PT Goal: Ambulate - Progress: Goal set today Pt will Go Up / Down Stairs: 3-5 stairs;with supervision PT Goal: Up/Down Stairs - Progress: Goal set today Additional Goals Additional Goal #1: Pt will score >19 on the DGI indicating a decreased fall risk upon d/c PT Goal: Additional Goal #1 - Progress: Goal set today  Visit Information  Last PT Received On: 02/28/12 Assistance Needed: +1    Subjective Data      Prior Functioning  Home Living Lives With: Spouse Available Help at Discharge: Family;Available 24 hours/day Type of Home: House Home Access: Stairs to enter [employer] of Steps: 4 Entrance Stairs-Rails: None Home  Layout: One  level Bathroom Shower/Tub: Walk-in Engineer, petroleum: Yes How Accessible: Accessible via walker Home Adaptive Equipment: Built-in shower seat Prior Function Level of Independence: Independent Able to Take Stairs?: Yes Driving: Yes Vocation: Retired Musician: No difficulties Dominant Hand: Right    Cognition  Overall Cognitive Status: Appears within functional limits for tasks assessed/performed Arousal/Alertness: Awake/alert Orientation Level: Appears intact for tasks assessed Behavior During Session: [facility] for tasks performed    Extremity/Trunk Assessment Right Lower Extremity Assessment RLE ROM/Strength/Tone: Within functional levels RLE Sensation: WFL - Light Touch Left Lower Extremity Assessment LLE ROM/Strength/Tone: Within functional levels LLE Sensation: WFL - Light Touch   Balance    End of Session PT - End of Session Equipment Utilized During Treatment: Gait belt Activity Tolerance: Patient tolerated treatment well Patient left: in bed;with call bell/phone within reach;with bed alarm set;with family/visitor present Nurse Communication: Mobility status   Milana Kidney 02/28/2012, 6:00 PM  02/28/2012 Milana Kidney DPT PAGER: [phone] OFFICE: [phone]

## 2012-02-28 NOTE — H&P (Signed)
History and Physical Examination  Date: 02/28/2012  Patient name: Elizabeth [name] Medical record number: [id number] Date of birth: 06/11/1941 Age: 71 y.o. Gender: female PCP: Rudi Heap, MD  Chief Complaint:  Chief Complaint  Patient presents with  . Code Stroke     History of Present Illness: Asked to eval pt for acute CVA with new findings of ACA aneurysm on MRA. Pt has been seen by Neuro as well. Communication with Dr. Corliss Skains of findings. Now request for Angiogram for further evaluation of this aneurysm.  Below HPI obtained from admit H&P Elizabeth [name] is an 71 y.o. female who has hypertension and diabetes mellitus and hyperlipidemia who woke up this morning at around 7 AM and then went back to bed and woke up at 9 AM.  She says that she noticed that she was staggering on her feet her vision didn't seem right.  She said she her vision was blurred.  She had slurred speech.  And she had a headache as well.  Her husband noticed these symptoms and recommended that she come to the emergency department.  She went to [facility] emergency room and she was evaluated there.  She had a CT scan done and a code stroke was called.  Patient was evaluated by the telemedicine neurology team.  Her symptoms of slurred speech quickly began to improve in the ED.  She was not deemed a candidate for TPA.  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  The patient was sent to [facility] for further evaluation and management.  Symptomatically the patient has been rapidly improving.  Her speech is improved but not 100% back to normal.   Past Medical History Past Medical History  Diagnosis Date  . Diarrhea   . Obesity, unspecified   . Other and unspecified hyperlipidemia   . Unspecified essential hypertension   . Family history of malignant neoplasm of  gastrointestinal tract   . Esophageal reflux   . Stricture and stenosis of esophagus   . Diverticulosis of colon (without mention of hemorrhage)   . Personal history of colonic polyps   . Active smoker   . Unspecified asthma   . Type II or unspecified type diabetes mellitus without mention of complication, not stated as uncontrolled   . Chronic airway obstruction, not elsewhere classified   . Headache     Irregular  . Anxiety     Takes Xanax for anxiety    Past Surgical History Past Surgical History  Procedure Date  . Total abdominal hysterectomy   . Tonsillectomy   . Lumbar disc surgery   . Knee arthroscopy     left  . Appendectomy   . Cardiac catheterization 2006    LAD: 30%, RCA : 20%, normal EF, elevated LVEDP  . Cardiac catheterization   . Back surgery     Spinal surgery    Home Meds: Prior to Admission medications   Medication Sig Start Date End Date Taking? Authorizing Provider  ALPRAZolam (XANAX) 0.25 MG tablet Take 0.25 mg by mouth 2 (two) times daily as needed.    Yes Historical Provider, MD  amLODipine (NORVASC) 10 MG tablet Take 5 mg by mouth daily.    Yes Historical Provider, MD  aspirin 325 MG tablet Take 325 mg by mouth daily.   Yes Historical Provider, MD  diphenhydrAMINE (BENADRYL)  25 MG tablet Take 25 mg by mouth at bedtime as needed.   Yes Historical Provider, MD  ergocalciferol (VITAMIN D2) 50000 UNITS capsule Take 50,000 Units by mouth once a week.   Yes Historical Provider, MD  escitalopram (LEXAPRO) 10 MG tablet Take 20 mg by mouth daily.    Yes Historical Provider, MD  metaxalone (SKELAXIN) 800 MG tablet Take 800 mg by mouth 2 (two) times daily.   Yes Historical Provider, MD  metoCLOPramide (REGLAN) 10 MG tablet Take 10 mg by mouth 4 (four) times daily.    Yes Historical Provider, MD  omeprazole (PRILOSEC) 20 MG capsule Take 20 mg by mouth daily.     Yes Historical Provider, MD  oxybutynin (DITROPAN XL) 15 MG 24 hr tablet Take 15 mg by mouth daily.      Yes Historical Provider, MD  propoxyphene-acetaminophen (DARVOCET-N 100) 100-650 MG per tablet Take 1 tablet by mouth every 8 (eight) hours as needed. For pain   Yes Historical Provider, MD  rosuvastatin (CRESTOR) 20 MG tablet Take 20 mg by mouth daily.   Yes Historical Provider, MD  sitaGLIPtan-metformin (JANUMET) 50-1000 MG per tablet Take 1 tablet by mouth daily.     Yes Historical Provider, MD  triamterene-hydrochlorothiazide (MAXZIDE) 75-50 MG per tablet Take 1 tablet by mouth daily.   Yes Historical Provider, MD    Allergies: Banana; Benicar; Codeine; Diovan; Latex; Penicillins; Spinach; Sulfonamide derivatives; and Ace inhibitors  Social History:  History   Social History  . Marital Status: Married    Spouse Name: N/A    Number of Children: N/A  . Years of Education: N/A   Occupational History  . RN     [employer]   Social History Main Topics  . Smoking status: Current Everyday Smoker -- 1.0 packs/day for 20 years    Types: Cigarettes    Last Attempt to Quit: 12/17/2010  . Smokeless tobacco: Never Used  . Alcohol Use: No  . Drug Use: No  . Sexually Active: Not on file   Other Topics Concern  . Not on file   Social History Narrative  . No narrative on file   Family History:  Family History  Problem Relation Age of Onset  . Colon cancer Other     first cousin, paternal aunt and uncle  . Cancer Paternal Grandmother     gastric  . Breast cancer Maternal Aunt     several paternal cousins  . Diabetes Mother   . Heart disease Mother   . Heart disease Maternal Grandmother   . Thyroid cancer Other     aunt  . Thyroid disease Sister     uncertain type-not cancer    Review of Systems: Pertinent items are noted in HPI. All other systems reviewed and reported as negative.   Physical Exam: Blood pressure 178/79, pulse 59, temperature 97.7 F (36.5 C), temperature source Oral, resp. rate 18, height 5\' 2"  (1.575 m), weight 188 lb (85.276 kg), SpO2  98.00%. Physical Examination: General appearance - alert, well appearing, and in no distress Mental status - alert, oriented to person, place, and time, normal mood, behavior, speech, dress, motor activity, and thought processes Eyes - pupils equal and reactive, extraocular eye movements intact Mouth - mucous membranes moist, pharynx normal without lesions and tongue normal Neck - supple, no significant adenopathy, carotids upstroke normal bilaterally, no bruits Chest - clear to auscultation, no wheezes, rales or rhonchi, symmetric air entry Heart - normal rate, regular rhythm, normal  S1, S2, no murmurs, rubs, clicks or gallops Abdomen - soft, nontender, nondistended, no masses or organomegaly Neurological - alert, oriented, subtle speech difficulties, no focal findings or movement disorder noted, abnormal neurological exam unchanged from prior examinations Extremities - peripheral pulses normal, no pedal edema, no clubbing or cyanosis  Lab  And Imaging results:  Results for orders placed during the hospital encounter of 02/27/12 (from the past 24 hour(s))  CARDIAC PANEL(CRET KIN+CKTOT+MB+TROPI)     Status: Abnormal   Collection Time   02/27/12  7:24 PM      Component Value Range   Total CK 112  7 - 177 U/L   CK, MB 3.0  0.3 - 4.0 ng/mL   Troponin I <0.30  <0.30 ng/mL   Relative Index 2.7 (*) 0.0 - 2.5  HEMOGLOBIN A1C     Status: Abnormal   Collection Time   02/27/12  7:25 PM      Component Value Range   Hemoglobin A1C 6.0 (*) <5.7 %   Mean Plasma Glucose 126 (*) <117 mg/dL  GLUCOSE, CAPILLARY     Status: Normal   Collection Time   02/27/12 10:23 PM      Component Value Range   Glucose-Capillary 99  70 - 99 mg/dL  CARDIAC PANEL(CRET KIN+CKTOT+MB+TROPI)     Status: Abnormal   Collection Time   02/28/12  2:59 AM      Component Value Range   Total CK 104  7 - 177 U/L   CK, MB 2.9  0.3 - 4.0 ng/mL   Troponin I <0.30  <0.30 ng/mL   Relative Index 2.8 (*) 0.0 - 2.5  LIPID PANEL      Status: Normal   Collection Time   02/28/12  5:00 AM      Component Value Range   Cholesterol 169  0 - 200 mg/dL   Triglycerides 782  <956 mg/dL   HDL 49  >21 mg/dL   Total CHOL/HDL Ratio 3.4     VLDL 25  0 - 40 mg/dL   LDL Cholesterol 95  0 - 99 mg/dL  BASIC METABOLIC PANEL     Status: Abnormal   Collection Time   02/28/12  5:00 AM      Component Value Range   Sodium 137  135 - 145 mEq/L   Potassium 3.6  3.5 - 5.1 mEq/L   Chloride 102  96 - 112 mEq/L   CO2 24  19 - 32 mEq/L   Glucose, Bld 110 (*) 70 - 99 mg/dL   BUN 13  6 - 23 mg/dL   Creatinine, Ser 3.08  0.50 - 1.10 mg/dL   Calcium 9.0  8.4 - 65.7 mg/dL   GFR calc non Af Amer 84 (*) >90 mL/min   GFR calc Af Amer >90  >90 mL/min  GLUCOSE, CAPILLARY     Status: Abnormal   Collection Time   02/28/12  7:02 AM      Component Value Range   Glucose-Capillary 106 (*) 70 - 99 mg/dL   Comment 1 Documented in Chart     Comment 2 Notify RN     Impression  *CVA (cerebral vascular accident)  DM  HYPERLIPIDEMIA  HYPERTENSION  ASTHMA  COPD  GERD  DIVERTICULOSIS, COLON  COLONIC POLYPS, HX OF  Cerebral aneurysm  Tobacco abuse  Slurred speech  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  Gait instability  Vision disturbance S/P CVA (cerebrovascular accident)  Hyponatremia  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 Renal function ok. Coags ok Cont Plavix Will decide treatment of aneurysm based on angio findings tomorrow. Consent signed in chart. NPO after MN   [facility], Caryn Bee

## 2012-02-28 NOTE — Progress Notes (Signed)
[facility] Progress Note  02/28/2012  Subjective: Pt has some slurred speech but overall improving.  No CP, No SOB  Objective:  Vital signs in last 24 hours: Filed Vitals:   02/28/12 0409 02/28/12 0412 02/28/12 0550 02/28/12 0630  BP: 159/81 174/77 171/90 135/82  Pulse:  65 67   Temp:  97.6 F (36.4 C) 97.9 F (36.6 C)   TempSrc:  Oral Oral   Resp:  20 18   Height:      Weight:      SpO2:  98% 100%    Weight change:   Intake/Output Summary (Last 24 hours) at 02/28/12 0942 Last data filed at 02/28/12 2130  Gross per 24 hour  Intake 1392.5 ml  Output      0 ml  Net 1392.5 ml   Lab Results  Component Value Date   HGBA1C 6.0* 02/27/2012   Lab Results  Component Value Date   LDLCALC 95 02/28/2012   CREATININE 0.77 02/28/2012    Review of Systems As above, otherwise all reviewed and reported negative  Physical Exam General - awake, no distress, cooperative HEENT - NCAT, MMM Lungs - BBS, CTA CV - normal s1, s2 sounds Abd - soft, nondistended, no masses, nontender Ext - no clubbing or cyanosis Neuro - slight slurred speech, strength 5/5 bilateral  Lab Results: Results for orders placed during the hospital encounter of 02/27/12 (from the past 24 hour(s))  GLUCOSE, CAPILLARY     Status: Abnormal   Collection Time   02/27/12 11:06 AM      Component Value Range   Glucose-Capillary 111 (*) 70 - 99 mg/dL  CBC     Status: Normal   Collection Time   02/27/12 11:20 AM      Component Value Range   WBC 7.0  4.0 - 10.5 K/uL   RBC 4.81  3.87 - 5.11 MIL/uL   Hemoglobin 13.9  12.0 - 15.0 g/dL   HCT 86.5  78.4 - 69.6 %   MCV 83.0  78.0 - 100.0 fL   MCH 28.9  26.0 - 34.0 pg   MCHC 34.8  30.0 - 36.0 g/dL   RDW 29.5  28.4 - 13.2 %   Platelets 283  150 - 400 K/uL  BASIC METABOLIC PANEL     Status: Abnormal   Collection Time   02/27/12 11:20 AM      Component Value Range   Sodium 131 (*) 135 - 145 mEq/L   Potassium 3.6  3.5 - 5.1 mEq/L   Chloride 96  96 - 112 mEq/L    CO2 25  19 - 32 mEq/L   Glucose, Bld 103 (*) 70 - 99 mg/dL   BUN 10  6 - 23 mg/dL   Creatinine, Ser 4.40  0.50 - 1.10 mg/dL   Calcium 9.8  8.4 - 10.2 mg/dL   GFR calc non Af Amer 84 (*) >90 mL/min   GFR calc Af Amer >90  >90 mL/min  PROTIME-INR     Status: Normal   Collection Time   02/27/12 11:20 AM      Component Value Range   Prothrombin Time 14.1  11.6 - 15.2 seconds   INR 1.07  0.00 - 1.49  APTT     Status: Normal   Collection Time   02/27/12 11:20 AM      Component Value Range   aPTT 32  24 - 37 seconds  CARDIAC PANEL(CRET KIN+CKTOT+MB+TROPI)     Status: Normal   Collection Time  02/27/12 11:20 AM      Component Value Range   Total CK 95  7 - 177 U/L   CK, MB 2.7  0.3 - 4.0 ng/mL   Troponin I <0.30  <0.30 ng/mL   Relative Index RELATIVE INDEX IS INVALID  0.0 - 2.5  CARDIAC PANEL(CRET KIN+CKTOT+MB+TROPI)     Status: Abnormal   Collection Time   02/27/12  7:24 PM      Component Value Range   Total CK 112  7 - 177 U/L   CK, MB 3.0  0.3 - 4.0 ng/mL   Troponin I <0.30  <0.30 ng/mL   Relative Index 2.7 (*) 0.0 - 2.5  HEMOGLOBIN A1C     Status: Abnormal   Collection Time   02/27/12  7:25 PM      Component Value Range   Hemoglobin A1C 6.0 (*) <5.7 %   Mean Plasma Glucose 126 (*) <117 mg/dL  GLUCOSE, CAPILLARY     Status: Normal   Collection Time   02/27/12 10:23 PM      Component Value Range   Glucose-Capillary 99  70 - 99 mg/dL  CARDIAC PANEL(CRET KIN+CKTOT+MB+TROPI)     Status: Abnormal   Collection Time   02/28/12  2:59 AM      Component Value Range   Total CK 104  7 - 177 U/L   CK, MB 2.9  0.3 - 4.0 ng/mL   Troponin I <0.30  <0.30 ng/mL   Relative Index 2.8 (*) 0.0 - 2.5  LIPID PANEL     Status: Normal   Collection Time   02/28/12  5:00 AM      Component Value Range   Cholesterol 169  0 - 200 mg/dL   Triglycerides 161  <096 mg/dL   HDL 49  >04 mg/dL   Total CHOL/HDL Ratio 3.4     VLDL 25  0 - 40 mg/dL   LDL Cholesterol 95  0 - 99 mg/dL  BASIC METABOLIC  PANEL     Status: Abnormal   Collection Time   02/28/12  5:00 AM      Component Value Range   Sodium 137  135 - 145 mEq/L   Potassium 3.6  3.5 - 5.1 mEq/L   Chloride 102  96 - 112 mEq/L   CO2 24  19 - 32 mEq/L   Glucose, Bld 110 (*) 70 - 99 mg/dL   BUN 13  6 - 23 mg/dL   Creatinine, Ser 5.40  0.50 - 1.10 mg/dL   Calcium 9.0  8.4 - 98.1 mg/dL   GFR calc non Af Amer 84 (*) >90 mL/min   GFR calc Af Amer >90  >90 mL/min  GLUCOSE, CAPILLARY     Status: Abnormal   Collection Time   02/28/12  7:02 AM      Component Value Range   Glucose-Capillary 106 (*) 70 - 99 mg/dL   Comment 1 Documented in Chart     Comment 2 Notify RN      Micro Results: No results found for this or any previous visit (from the past 240 hour(s)).  Medications:  Scheduled Meds:   . amLODipine  10 mg Oral Daily  . clopidogrel  75 mg Oral Q breakfast  . escitalopram  20 mg Oral Daily  . hydrochlorothiazide  12.5 mg Oral Daily  . insulin aspart  0-15 Units Subcutaneous TID WC  . oxybutynin  15 mg Oral Daily  . pantoprazole  40 mg Oral Q1200  .  rosuvastatin  20 mg Oral q1800  . DISCONTD: amLODipine  5 mg Oral Daily  . DISCONTD: aspirin  324 mg Oral Once  . DISCONTD: aspirin  325 mg Oral Daily  . DISCONTD: NON FORMULARY 20 mg  20 mg Oral Daily   Continuous Infusions:   . DISCONTD: sodium chloride 100 mL/hr at 02/27/12 1135  . 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  NPO after midnight.   DM type 2 - controlled, continue current  mgmt  HYPERLIPIDEMIA - controlled, LDL 96, continue crestor 20 mg daily  HYPERTENSION - increase amlodipine to 10 mg daily  COPD - nebs prn, counseled on smoking cessation  GERD - stable  Hyponatremia - resolved, decreased diuretics dose   LOS: 1 day   Jamy Whyte 02/28/2012, 9:42 AM  Cleora Fleet, MD, CDE, FAAFP [facility] [place], [place]  [phone]

## 2012-02-29 ENCOUNTER — Inpatient Hospital Stay (HOSPITAL_COMMUNITY): Payer: Medicare Other

## 2012-02-29 DIAGNOSIS — G459 Transient cerebral ischemic attack, unspecified: Secondary | ICD-10-CM

## 2012-02-29 DIAGNOSIS — I6340 Cerebral infarction due to embolism of unspecified cerebral artery: Secondary | ICD-10-CM

## 2012-02-29 DIAGNOSIS — I10 Essential (primary) hypertension: Secondary | ICD-10-CM

## 2012-02-29 DIAGNOSIS — E119 Type 2 diabetes mellitus without complications: Secondary | ICD-10-CM

## 2012-02-29 DIAGNOSIS — I671 Cerebral aneurysm, nonruptured: Secondary | ICD-10-CM

## 2012-02-29 LAB — GLUCOSE, CAPILLARY
Glucose-Capillary: 110 mg/dL — ABNORMAL HIGH (ref 70–99)
Glucose-Capillary: 106 mg/dL — ABNORMAL HIGH (ref 70–99)

## 2012-02-29 MED ORDER — CLOPIDOGREL BISULFATE 75 MG PO TABS: 75.0000 mg | ORAL_TABLET | Freq: Every day | ORAL | Status: DC

## 2012-02-29 MED ORDER — SODIUM CHLORIDE 0.9 % IV SOLN
INTRAVENOUS | Status: AC
  Administered 2012-02-29: 1000 mL via INTRAVENOUS

## 2012-02-29 MED ORDER — IOHEXOL 300 MG/ML  SOLN
200.0000 mL | Freq: Once | INTRAMUSCULAR | Status: AC | PRN
  Administered 2012-02-29: 90 mL via INTRA_ARTERIAL

## 2012-02-29 MED ORDER — FENTANYL CITRATE 0.05 MG/ML IJ SOLN
INTRAMUSCULAR | Status: AC | PRN
  Administered 2012-02-29: 25 ug via INTRAVENOUS

## 2012-02-29 MED ORDER — MIDAZOLAM HCL 5 MG/5ML IJ SOLN
INTRAMUSCULAR | Status: AC | PRN
  Administered 2012-02-29: 1 mg via INTRAVENOUS

## 2012-02-29 MED ORDER — HEPARIN SOD (PORK) LOCK FLUSH 100 UNIT/ML IV SOLN
INTRAVENOUS | Status: AC | PRN
  Administered 2012-02-29 (×2): 500 [IU] via INTRAVENOUS

## 2012-02-29 MED ORDER — ASPIRIN 81 MG PO TABS: 81.0000 mg | ORAL_TABLET | Freq: Every day | ORAL | Status: DC

## 2012-02-29 MED ORDER — HYDRALAZINE HCL 20 MG/ML IJ SOLN
INTRAMUSCULAR | Status: AC | PRN
  Administered 2012-02-29 (×3): 5 mg via INTRAVENOUS

## 2012-02-29 MED ORDER — HYDROCHLOROTHIAZIDE 12.5 MG PO CAPS: 12.5000 mg | ORAL_CAPSULE | Freq: Every day | ORAL | Status: DC

## 2012-02-29 NOTE — Discharge Summary (Signed)
Physician Discharge Summary  Patient ID: Elizabeth [name] MRN: [id number] DOB/AGE: [birth year] 69 y.o.  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  Please followup on final report of carotid Doppler studies and arrange repeat echocardiogram and follow results.  Please work with patient on smoking cessation and follow up blood pressure and diabetes control and dyslipidemia for secondary stroke prevention. Please send patient to eye care specialist for dilated eye exam and vision testing.  Pt to follow up with neurology in 6 weeks and pt to have follow up with interventional radiology in 3 days for treatment of cerebral aneurysm.     Discharged Condition: stable, good  Hospital Course: Elizabeth [name] is an 71 y.o. female who has hypertension and diabetes mellitus and hyperlipidemia who woke up this morning at around 7 AM and then went back to bed and woke up at 9 AM. She says that she noticed that she was staggering on her feet her vision didn't seem right. She said she her vision was blurred. She had slurred speech. And she had a headache as well. Her husband noticed these symptoms and recommended that she  come to the emergency department. She went to [facility] emergency room and she was evaluated there. She had a CT scan done and a code stroke was called. Patient was evaluated by the telemedicine neurology team. Her symptoms of slurred speech quickly began to improve in the ED. She was not deemed a candidate for TPA. 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 The patient was sent to [facility] for further evaluation and management. Symptomatically the patient has been rapidly improving. Her speech is improved but not 100% back to normal.  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 Pearlean Brownie in 6 weeks, Carotids unremarkable, will continue to work on smoking cessation, blood pressure control and diabetes mellitus control.   Pt had recent ECHO in march, reviewed and essentially unremarkable, carotid doppler studies Final report to be reviewed by primary care physician on outpatient follow up.  Started on plavix, home on plavix, aspirin, follow up with PCP.  Primary care physician to arrange outpatient physical therapy for the patient on followup appointment and encourage and counsel regarding ongoing tobacco cessation.  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  IR consult for eval recommendations, Dr. Corliss Skains did an angiogram and recommended pt for coil procedure on Wednesday, 06/26. Safe for pt to go home but needs to  return on Wed for procedure, they will call patient with information about her return appointment on Wednesday 06/26.   DM type 2 - controlled, continue current mgmt   Tobacco Abuse - the patient was strongly advised to discontinue use of all nicotine tobacco products.  The  patient declined assistance with medications and patches for smoking cessation but was counseled extensively regarding risk for recurrent CVA or cardiovascular disease related to ongoing smoking.  The patient verbalized understanding.  She will followup with her primary care physician for ongoing counseling and assistance with smoking cessation.  HYPERLIPIDEMIA - controlled, LDL 96, continue crestor 20 mg daily.  The patient admits that she has not been taking the Crestor regularly, I have counseled her extensively regarding this and she has agreed to start taking it regularly.   HYPERTENSION - increase amlodipine to 10 mg daily, added HCTZ 12.5mg  daily, follow up with PCP to further titrate and adjust medications, pt reports intolerance to ACEI and ARB medications at this time.    COPD - remained stable counseled on smoking cessation   GERD - stable   Hyponatremia - thought to be related to Maxzide medication that she was taking for blood pressure this was discontinued and the patient was started on hydrochlorothiazide 12.5 mg by mouth daily.  Resolved at discharge.  Consults: neurology and interventional radiology  Significant Diagnostic Studies:   Vascular Ultrasound  Carotid Duplex (Doppler) has been completed. Preliminary findings: Bilaterally no significant ICA stenosis with antegrade vertebral flow. Bilateral ECA stenosis.  Elizabeth [name], Elizabeth [name] RDMS  02/29/2012, 12:04 PM  Ct Head Wo Contrast  02/27/2012 *RADIOLOGY REPORT* Clinical Data: Code Stroke, slurred speech, unable to walk or talk CT HEAD WITHOUT CONTRAST Technique: Contiguous axial images were obtained from the base of the skull through the vertex without contrast. Comparison: 03/31/2006 Findings: Mild atrophy. Normal ventricular morphology. No midline shift or mass effect. Small vessel chronic ischemic changes of deep cerebral white matter. Old periventricular white matter infarcts. Scattered small vessel chronic ischemic changes of deep  cerebral white matter. No intracranial hemorrhage, mass lesion, or evidence of acute infarction. No extra-axial fluid collections. Posterior fossa unremarkable. Visualized paranasal sinuses and mastoid air cells clear. No acute osseous findings. IMPRESSION: Atrophy with small vessel chronic ischemic changes of deep cerebral white matter. Old periventricular white matter infarcts. No definite acute intracranial abnormalities. Findings called to Dr. Deretha Emory on 02/27/2012 at 1120 hrs. Original Report Authenticated By: Lollie Marrow, M.D.   Mr [facility] Wo Contrast  02/27/2012 *RADIOLOGY REPORT* Clinical Data: Difficulty talking. Diabetes. Obesity. MRI HEAD WITHOUT CONTRAST MRA HEAD WITHOUT CONTRAST Technique: Multiplanar, multiecho pulse sequences of the brain and surrounding structures were obtained according to standard protocol without intravenous contrast. Angiographic images of the head were obtained using MRA technique without contrast. Comparison: 02/27/2012 CT. 04/11/2010 MR. MRI HEAD Findings: Acute non hemorrhagic left paracentral pontine infarct. Remote infarcts corona radiata bilaterally. Remote small thalamic infarcts. Progressive moderate small vessel disease type changes. No intracranial mass lesion detected on this unenhanced exam. Global atrophy without hydrocephalus. No intracranial hemorrhage. IMPRESSION: Acute non hemorrhagic left paracentral pontine infarct. Please see above. MRA HEAD Findings: Markedly ectatic vertical cervical segment of the right internal carotid artery. Focal bulge distal vertical cervical segment left internal carotid artery. This is probably related to atherosclerotic type changes rather than aneurysm or fibromuscular dysplasia. Moderate to marked stenosis cavernous segment left internal carotid artery. Mild narrowing petrous and pre cavernous segment as well as cavernous segment of the right internal carotid  artery. 7.8 x 5.8 x 4.9 mm aneurysm anterior communicating artery  region. Moderate middle cerebral artery branch vessel irregularity more notable on the right. Mild irregularity of the vertebral arteries without high-grade stenosis. Nonvisualization right PICA. Mild to moderate irregularity with mild narrowing basilar artery. Nonvisualization AICAs. Moderate irregularity involving proximal and distal aspect of the posterior cerebral arteries and superior cerebellar arteries bilaterally. IMPRESSION: Prominent intracranial atherosclerotic type changes as detailed above. 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 Critical Value/emergent results were called by telephone at the time of interpretation on 02/27/2012 at 2:20 p.m. to Dr. Deretha Emory, who verbally acknowledged these results. Original Report Authenticated By: Fuller Canada, M.D.   Mr Brain Wo Contrast  02/27/2012 *RADIOLOGY REPORT* Clinical Data: Difficulty talking. Diabetes. Obesity. MRI HEAD WITHOUT CONTRAST MRA HEAD WITHOUT CONTRAST Technique: Multiplanar, multiecho pulse sequences of the brain and surrounding structures were obtained according to standard protocol without intravenous contrast. Angiographic images of the head were obtained using MRA technique without contrast. Comparison: 02/27/2012 CT. 04/11/2010 MR. MRI HEAD Findings: Acute non hemorrhagic left paracentral pontine infarct. Remote infarcts corona radiata bilaterally. Remote small thalamic infarcts. Progressive moderate small vessel disease type changes. No intracranial mass lesion detected on this unenhanced exam. Global atrophy without hydrocephalus. No intracranial hemorrhage. IMPRESSION: Acute non hemorrhagic left paracentral pontine infarct. Please see above. MRA HEAD Findings: Markedly ectatic vertical cervical segment of the right internal carotid artery. Focal bulge distal vertical cervical segment left internal carotid artery. This is probably related to atherosclerotic type changes rather than aneurysm or fibromuscular dysplasia. Moderate  to marked stenosis cavernous segment left internal carotid artery. Mild narrowing petrous and pre cavernous segment as well as cavernous segment of the right internal carotid artery. 7.8 x 5.8 x 4.9 mm aneurysm anterior communicating artery region. Moderate middle cerebral artery branch vessel irregularity more notable on the right. Mild irregularity of the vertebral arteries without high-grade stenosis. Nonvisualization right PICA. Mild to moderate irregularity with mild narrowing basilar artery. Nonvisualization AICAs. Moderate irregularity involving proximal and distal aspect of the posterior cerebral arteries and superior cerebellar arteries bilaterally. IMPRESSION: Prominent intracranial atherosclerotic type changes as detailed above. 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 Critical Value/emergent results were called by telephone at the time of interpretation on 02/27/2012 at 2:20 p.m. to Dr. Deretha Emory, who verbally acknowledged these results. Original Report Authenticated By: Fuller Canada, M.D.    Pre-procedure Dx: Brain aneurysm [437.3]    Post-procedure Dx: Brain aneurysm [437.3]    Procedures: CEREBRAL ANGIOGRAM [ZOX0960 (Type: Custom)]   S/P 4 vessel cerebral arteriogram  RT CFA approach.  Findings  1. App 7mm x 5,.5 mm Acom Aneurysm      2D Echocardiogram Pending at time of discharge - results to be followed up with PCP and neurologist outpatient  Disposition: Home with husband with close follow up and outpatient PT to be arranged by PCP on follow up appointment  Discharge Orders    Future Orders Please Complete By Expires   Diet - low sodium heart healthy      Comments:   Carbohydrate modified and consistent diet with no concentrated sweets or fruit juices except to treat a low blood sugar   Increase activity slowly      Discharge instructions      Comments:   Follow up with primary care physician in next 5 days for hospital follow up, get Echocardiogram and carotid  doppler studies results and follow up results through primary care physician.  Follow up with neurologist in 6  weeks - call and get an appointment. Check your blood pressure daily 1-2 times per day and record readings and bring to primary care followup visit. Check blood glucose sugar 3 times per day, record readings and bring to primary care office visit Return if symptoms recur, worsen or new problems develop Interventional radiology will be calling on Monday to setup an appointment for you to have your procedure done on Wednesday. Please stop smoking and using all tobacco and nicotine products. Please have your primary care physician arrange for you to start outpatient physical therapy.  Please discuss with primary care physician on follow up.     Medication List  As of 02/29/2012 12:12 PM   STOP taking these medications         propoxyphene-acetaminophen 100-650 MG per tablet      triamterene-hydrochlorothiazide 75-50 MG per tablet         TAKE these medications         ALPRAZolam 0.25 MG tablet   Commonly known as: XANAX   Take 0.25 mg by mouth 2 (two) times daily as needed.      amLODipine 10 MG tablet   Commonly known as: NORVASC   Take 5 mg by mouth daily.      aspirin 81 MG tablet   Take 1 tablet (81 mg total) by mouth daily.      clopidogrel 75 MG tablet   Commonly known as: PLAVIX   Take 1 tablet (75 mg total) by mouth daily with breakfast.      diphenhydrAMINE 25 MG tablet   Commonly known as: BENADRYL   Take 25 mg by mouth at bedtime as needed.      ergocalciferol 50000 UNITS capsule   Commonly known as: VITAMIN D2   Take 50,000 Units by mouth once a week.      escitalopram 10 MG tablet   Commonly known as: LEXAPRO   Take 20 mg by mouth daily.      hydrochlorothiazide 12.5 MG capsule   Commonly known as: MICROZIDE   Take 1 capsule (12.5 mg total) by mouth daily.      metaxalone 800 MG tablet   Commonly known as: SKELAXIN   Take 800 mg by mouth 2 (two)  times daily.      metoCLOPramide 10 MG tablet   Commonly known as: REGLAN   Take 10 mg by mouth 4 (four) times daily.      omeprazole 20 MG capsule   Commonly known as: PRILOSEC   Take 20 mg by mouth daily.      oxybutynin 15 MG 24 hr tablet   Commonly known as: DITROPAN XL   Take 15 mg by mouth daily.      rosuvastatin 20 MG tablet   Commonly known as: CRESTOR   Take 20 mg by mouth daily.      sitaGLIPtan-metformin 50-1000 MG per tablet   Commonly known as: JANUMET   Take 1 tablet by mouth daily.           Follow-up Information    Follow up with Oneal Grout, MD in 3 days. (They will call you on Monday with further information about appointment)    Contact information:   [address] [place] [address] [phone]       Follow up with Rudi Heap, MD. Schedule an appointment as soon as possible for a visit in 1 week. [facility] follow up, Get Echo and Carotid Dopplers)    Contact  information:   [address] [place] [address] [phone]       Follow up with Gates Rigg, MD. Schedule an appointment as soon as possible for a visit in 6 weeks. [facility] follow up Stroke)    Contact information:   [address] [facility] [place] [address] [phone]         I spent 48 minutes preparing this patient's discharge, reviewing medical records, hospital notes, writing prescriptions, reconciling medications, etc.  Signed: Standley Dakins MD 02/29/2012, 12:12 PM Pager [phone] [facility] [place], [place]

## 2012-02-29 NOTE — Progress Notes (Signed)
   CARE MANAGEMENT NOTE 02/29/2012  Patient:  Elizabeth [name], Elizabeth [name]   Account Number:  [id number]  Date Initiated:  02/29/2012  Documentation initiated by:  [facility]  Subjective/Objective Assessment:   CVA (cerebral vascular accident)     Action/Plan:   Anticipated DC Date:  02/29/2012   Anticipated DC Plan:  HOME/SELF CARE      DC Planning Services  CM consult      Choice offered to / List presented to:             Status of service:  Completed, signed off Medicare Important Message given?   (If response is "NO", the following Medicare IM given date fields will be blank) Date Medicare IM given:   Date Additional Medicare IM given:    Discharge Disposition:  HOME/SELF CARE  Per UR Regulation:    If discussed at Long Length of Stay Meetings, dates discussed:    Comments:  02/29/2012 1710 Spoke to Dr. Laural Benes, and pt is to follow up with PCP for instruction on outpt PT. Isidoro Donning RN CCM Case Mgmt phone [phone]

## 2012-02-29 NOTE — Progress Notes (Signed)
I spoke with Dr. Corliss Skains, he says that pt can go home later today but will return on Wednesday for coil procedure.  He says his team will contact patient on Monday with instructions on how to proceed with getting the procedure done on Wednesday.  Pt is to go home on ASA and plavix. Maryln Manuel, MD

## 2012-02-29 NOTE — Progress Notes (Signed)
OT Cancellation Note  Evaluation cancelled today due to medical issues with patient which prohibited therapy. Pt returning from angiogram. Pt on bedrest post test. Will attempt as time allows   Cassandria Anger, OTR/L Pager [phone] 02/29/2012, 11:33 AM

## 2012-02-29 NOTE — Progress Notes (Signed)
*  PRELIMINARY RESULTS* Vascular Ultrasound Carotid Duplex (Doppler) has been completed.  Preliminary findings: Bilaterally no significant ICA stenosis with antegrade vertebral flow. Bilateral ECA stenosis.  Farrel Demark RDMS 02/29/2012, 12:04 PM

## 2012-02-29 NOTE — Progress Notes (Signed)
[facility] Progress Note  02/29/2012  Subjective: Pt resting comfortably after angiogram procedure this morning, lying flat in bed.  No apparent distress.    Objective:  Vital signs in last 24 hours: Filed Vitals:   02/29/12 0926 02/29/12 0931 02/29/12 0935 02/29/12 1040  BP: 164/68 159/64 157/70 155/84  Pulse: 58 58 60 60  Temp:    97.7 F (36.5 C)  TempSrc:    Oral  Resp: 13 10 12 16   Height:      Weight:      SpO2: 100% 99% 99% 98%   Weight change:   Intake/Output Summary (Last 24 hours) at 02/29/12 1149 Last data filed at 02/29/12 0700  Gross per 24 hour  Intake   1365 ml  Output      0 ml  Net   1365 ml   Lab Results  Component Value Date   HGBA1C 6.3* 02/28/2012   HGBA1C 6.0* 02/27/2012   Lab Results  Component Value Date   LDLCALC 95 02/28/2012   CREATININE 0.77 02/28/2012    Review of Systems As above, otherwise all reviewed and reported negative  Physical Exam General - awake, no distress, cooperative HEENT - NCAT, MMM Lungs - BBS, CTA CV - normal s1, s2 sounds Abd - soft, nondistended, no masses, nontender Ext - no C/C/E Neuro - nonfocal  Lab Results: Results for orders placed during the hospital encounter of 02/27/12 (from the past 24 hour(s))  GLUCOSE, CAPILLARY     Status: Normal   Collection Time   02/28/12  4:45 PM      Component Value Range   Glucose-Capillary 99  70 - 99 mg/dL  GLUCOSE, CAPILLARY     Status: Abnormal   Collection Time   02/28/12 10:21 PM      Component Value Range   Glucose-Capillary 167 (*) 70 - 99 mg/dL   Comment 1 Documented in Chart     Comment 2 Notify RN    GLUCOSE, CAPILLARY     Status: Abnormal   Collection Time   02/29/12  7:13 AM      Component Value Range   Glucose-Capillary 110 (*) 70 - 99 mg/dL   Comment 1 Documented in Chart     Comment 2 Notify RN    GLUCOSE, CAPILLARY     Status: Abnormal   Collection Time   02/29/12 11:09 AM      Component Value Range   Glucose-Capillary 106 (*) 70 - 99  mg/dL    Micro Results: No results found for this or any previous visit (from the past 240 hour(s)).  Medications:  Scheduled Meds:   . amLODipine  10 mg Oral Daily  . clopidogrel  75 mg Oral Q breakfast  . escitalopram  20 mg Oral Daily  . hydrochlorothiazide  12.5 mg Oral Daily  . insulin aspart  0-15 Units Subcutaneous TID WC  . oxybutynin  15 mg Oral Daily  . pantoprazole  40 mg Oral Q1200  . rosuvastatin  20 mg Oral q1800   Continuous Infusions:   . sodium chloride 75 mL/hr at 02/28/12 1840  . sodium chloride 1,000 mL (02/29/12 1109)   PRN Meds:.acetaminophen, acetaminophen, acetaminophen, albuterol, ALPRAZolam, fentaNYL, heparin lock flush, hydrALAZINE, iohexol, ipratropium, metoprolol, midazolam, ondansetron (ZOFRAN) IV, senna-docusate  Assessment/Plan: acute left paracentral ischemic pontine small vessel infarction - continue stroke work up, echo pending, Pt had recent ECHO in march, reviewed and essentially unremarkable, carotid doppler studies pending, started on plavix, home on plavix, aspirin, follow  up with PCP.    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 - IR consult for eval recommendations, pt for coil procedure on Wednesday, discussed with IR physician, pt can go home today but needs to return on Wed for procedure, will monitor in hospital for about 4 hours after procedure and home if stable  DM type 2 - controlled, continue current mgmt   HYPERLIPIDEMIA - controlled, LDL 96, continue crestor 20 mg daily   HYPERTENSION - increase amlodipine to 10 mg daily   COPD - nebs prn, counseled on smoking cessation   GERD - stable   Hyponatremia - resolved, decreased diuretics dose   LOS: 2 days   Nehal Shives 02/29/2012, 11:49 AM  Cleora Fleet, MD, CDE, FAAFP [facility] [place], [place]  [phone]

## 2012-02-29 NOTE — Progress Notes (Signed)
PT Cancellation Note  Treatment cancelled today due to medical issues with patient which prohibited therapy. Pt returning from angiogram. Pt on bedrest post test. Will attempt treatment tomorrow pending medical stability.  Thanks, 02/29/2012 Milana Kidney DPT PAGER: [phone] OFFICE: [phone]    Milana Kidney 02/29/2012, 10:27 AM

## 2012-02-29 NOTE — Procedures (Signed)
S/P 4 vessel cerebral arteriogram RT CFA approach. Findings  1. App 7mm x 5,.5 mm Acom Aneurysm

## 2012-02-29 NOTE — Discharge Instructions (Signed)
Stroke Prevention Some medical conditions and behaviors are associated with an increased chance of having a stroke. You may prevent a stroke by making healthy choices and managing medical conditions. Reduce your risk of having a stroke by:  Staying physically active. Get at least 30 minutes of activity on most or all days.   Not smoking. It may also be helpful to avoid exposure to secondhand smoke.   Limiting alcohol use. Moderate alcohol use is considered to be:   No more than 2 drinks per day for men.   No more than 1 drink per day for nonpregnant women.   Eating healthy foods.   Include 5 or more servings of fruits and vegetables a day.   Certain diets may be prescribed to address high blood pressure, high cholesterol, diabetes, or obesity.   Managing your cholesterol levels.   A low-saturated fat, low-trans fat, low-cholesterol, and high-fiber diet may control cholesterol levels.   Take any prescribed medicines to control cholesterol as directed by your caregiver.   Managing your diabetes.   A controlled-carbohydrate, controlled-sugar diet is recommended to manage diabetes.   Take any prescribed medicines to control diabetes as directed by your caregiver.   Controlling your high blood pressure (hypertension).   A low-salt (sodium), low-saturated fat, low-trans fat, and low-cholesterol diet is recommended to manage high blood pressure.   Take any prescribed medicines to control hypertension as directed by your caregiver.   Maintaining a healthy weight.   A reduced-calorie, low-sodium, low-saturated fat, low-trans fat, low-cholesterol diet is recommended to manage weight.   Stopping drug abuse.   Avoiding birth control pills.   Talk to your caregiver about the risks of taking birth control pills if you are over 64 years old, smoke, get migraines, or have ever had a blood clot.   Getting evaluated for sleep disorders (sleep apnea).   Talk to your caregiver about  getting a sleep evaluation if you snore a lot or have excessive sleepiness.   Taking medicines as directed by your caregiver.   For some people, aspirin or blood thinners (anticoagulants) are helpful in reducing the risk of forming abnormal blood clots that can lead to stroke. If you have the irregular heart rhythm of atrial fibrillation, you should be on a blood thinner unless there is a good reason you cannot take them.   Understand all your medicine instructions.  SEEK IMMEDIATE MEDICAL CARE IF:   You have sudden weakness or numbness of the face, arm, or leg, especially on one side of the body.   You have sudden confusion.   You have trouble speaking (aphasia) or understanding.   You have sudden trouble seeing in one or both eyes.   You have sudden trouble walking.   You have dizziness.   You have a loss of balance or coordination.   You have a sudden, severe headache with no known cause.   You have new chest pain or an irregular heartbeat.  Any of these symptoms may represent a serious problem that is an emergency. Do not wait to see if the symptoms will go away. Get medical help right away. Call your local emergency services (911 in [place] Do not drive yourself to the hospital. Document Released: 10/02/2004 Document Revised: 08/14/2011 Document Reviewed: 04/14/2011 [facility] Patient Information 2012 [place], [place].  Cerebral Aneurysm A cerebral aneurysm is the bulging or ballooning out of part of the wall of a vein or artery in the brain. CAUSES Common causes include:   Congenital (present  since birth) defects.   High blood pressure.   The build-up of fatty deposits in the arteries (atherosclerosis).   Blood vessels that develop abnormally.   Diseases that cause weakening and damage to the walls of blood vessels.  Uncommon causes include:  Head trauma (damage caused by an accident).   Infection.   Tumors.   Drug abuse (mostly from cocaine, heroin, and  amphetamine use).  Cerebral aneurysms can occur at any age. They are more common in adults than in children. They and are slightly more common in women than in men.  SYMPTOMS  The signs and symptoms of an unruptured cerebral aneurysm will partly depend on its size and rate of growth.  A small, unchanging aneurysm will generally produce no symptoms. A larger aneurysm that is steadily growing may produce symptoms such as headache, neck stiffness or pain, loss of feeling in the face or problems with the eyes.  If an aneurysm bursts, the problem can be life-threatening. Symptoms may include:  A sudden and usually severe headache.   Neck stiffness or pain.   Confusion and/or drowsiness.   Problems speaking.   Weakness in an arm and/or a leg.   Nausea (feeling sick to your stomach).   Vision impairment.   Vomiting.   Loss of consciousness.  Rupture of a cerebral aneurysm results in bleeding in the brain, causing a stroke. Or, blood can leak into the area around the brain and develop into a blood clot within the skull. More problems can occur as a result of the aneurysm breaking. These include:  Re-bleeding.   Hydrocephalus (an increase in normal brain fluid in the chambers inside the brain).   Vasospasm (blood vessels decrease in size and starve the brain of nutrients and oxygen).  TREATMENT  Emergency treatment for a ruptured cerebral aneurysm generally includes restoring breathing, and reducing pressure inside the head. Immediate emergency surgery may be recommended to help prevent damage caused by hydrocephalus and to reduce the risk of re-bleeding.  When aneurysms are discovered before rupture occurs, microcoil thrombosis or balloon embolization may be performed on patients for whom surgery is considered too risky. During these procedures, a thin, hollow tube (catheter) is inserted through an artery to travel up to the brain. Once the catheter reaches the aneurysm, tiny balloons or  coils are used to block blood flow through the aneurysm. Other treatments may include:  Bed rest.   Drug therapy.   Hypertensive-hypervolemic therapy (which elevates blood pressure, increases blood volume, and thins the blood) to drive blood flow through and around blocked arteries and control vasospasm.  PROGNOSIS  The prognosis for a patient with a ruptured cerebral aneurysm depends on:  The extent and location of the aneurysm.   The person's age.   General health.   Neurological condition.  Some people with a ruptured cerebral aneurysm die from the initial bleeding. Others recover with little or no problems. Early diagnosis and treatment are important. Document Released: 05/17/2002 Document Revised: 08/14/2011 Document Reviewed: 07/27/2007 [facility] Patient Information 2012 [place], [place].  Smoking Cessation This document explains the best ways for you to quit smoking and new treatments to help. It lists new medicines that can double or triple your chances of quitting and quitting for good. It also considers ways to avoid relapses and concerns you may have about quitting, including weight gain. NICOTINE: A POWERFUL ADDICTION If you have tried to quit smoking, you know how hard it can be. It is hard because nicotine is a very  addictive drug. For some people, it can be as addictive as heroin or cocaine. Usually, people make 2 or 3 tries, or more, before finally being able to quit. Each time you try to quit, you can learn about what helps and what hurts. Quitting takes hard work and a lot of effort, but you can quit smoking. QUITTING SMOKING IS ONE OF THE MOST IMPORTANT THINGS YOU WILL EVER DO.  You will live longer, feel better, and live better.   The impact on your body of quitting smoking is felt almost immediately:   Within 20 minutes, blood pressure decreases. Pulse returns to its normal level.   After 8 hours, carbon monoxide levels in the blood return to normal. Oxygen level  increases.   After 24 hours, chance of heart attack starts to decrease. Breath, hair, and body stop smelling like smoke.   After 48 hours, damaged nerve endings begin to recover. Sense of taste and smell improve.   After 72 hours, the body is virtually free of nicotine. Bronchial tubes relax and breathing becomes easier.   After 2 to 12 weeks, lungs can hold more air. Exercise becomes easier and circulation improves.   Quitting will reduce your risk of having a heart attack, stroke, cancer, or lung disease:   After 1 year, the risk of coronary heart disease is cut in half.   After 5 years, the risk of stroke falls to the same as a nonsmoker.   After 10 years, the risk of lung cancer is cut in half and the risk of other cancers decreases significantly.   After 15 years, the risk of coronary heart disease drops, usually to the level of a nonsmoker.   If you are pregnant, quitting smoking will improve your chances of having a healthy baby.   The people you live with, especially your children, will be healthier.   You will have extra money to spend on things other than cigarettes.  FIVE KEYS TO QUITTING Studies have shown that these 5 steps will help you quit smoking and quit for good. You have the best chances of quitting if you use them together: 1. Get ready.  2. Get support and encouragement.  3. Learn new skills and behaviors.  4. Get medicine to reduce your nicotine addiction and use it correctly.  5. Be prepared for relapse or difficult situations. Be determined to continue trying to quit, even if you do not succeed at first.  1. GET READY  Set a quit date.   Change your environment.   Get rid of ALL cigarettes, ashtrays, matches, and lighters in your home, car, and place of work.   Do not let people smoke in your home.   Review your past attempts to quit. Think about what worked and what did not.   Once you quit, do not smoke. NOT EVEN A PUFF!  2. GET SUPPORT AND  ENCOURAGEMENT Studies have shown that you have a better chance of being successful if you have help. You can get support in many ways.  Tell your family, friends, and coworkers that you are going to quit and need their support. Ask them not to smoke around you.   Talk to your caregivers (doctor, dentist, nurse, pharmacist, psychologist, and/or smoking counselor).   Get individual, group, or telephone counseling and support. The more counseling you have, the better your chances are of quitting. Programs are available at [employer] and health centers. Call your local health department for information about programs in  your area.   Spiritual beliefs and practices may help some smokers quit.   Quit meters are Photographer that keep track of quit statistics, such as amount of "quit-time," cigarettes not smoked, and money saved.   Many smokers find one or more of the many self-help books available useful in helping them quit and stay off tobacco.  3. LEARN NEW SKILLS AND BEHAVIORS  Try to distract yourself from urges to smoke. Talk to someone, go for a walk, or occupy your time with a task.   When you first try to quit, change your routine. Take a different route to work. Drink tea instead of coffee. Eat breakfast in a different place.   Do something to reduce your stress. Take a hot bath, exercise, or read a book.   Plan something enjoyable to do every day. Reward yourself for not smoking.   Explore interactive web-based programs that specialize in helping you quit.  4. GET MEDICINE AND USE IT CORRECTLY Medicines can help you stop smoking and decrease the urge to smoke. Combining medicine with the above behavioral methods and support can quadruple your chances of successfully quitting smoking. The [place] Food and Drug Administration (FDA) has approved 7 medicines to help you quit smoking. These medicines fall into 3 categories.  Nicotine replacement  therapy (delivers nicotine to your body without the negative effects and risks of smoking):   Nicotine gum: Available over-the-counter.   Nicotine lozenges: Available over-the-counter.   Nicotine inhaler: Available by prescription.   Nicotine nasal spray: Available by prescription.   Nicotine skin patches (transdermal): Available by prescription and over-the-counter.   Antidepressant medicine (helps people abstain from smoking, but how this works is unknown):   Bupropion sustained-release (SR) tablets: Available by prescription.   Nicotinic receptor partial agonist (simulates the effect of nicotine in your brain):   Varenicline tartrate tablets: Available by prescription.   Ask your caregiver for advice about which medicines to use and how to use them. Carefully read the information on the package.   Everyone who is trying to quit may benefit from using a medicine. If you are pregnant or trying to become pregnant, nursing an infant, you are under age 69, or you smoke fewer than 10 cigarettes per day, talk to your caregiver before taking any nicotine replacement medicines.   You should stop using a nicotine replacement product and call your caregiver if you experience nausea, dizziness, weakness, vomiting, fast or irregular heartbeat, mouth problems with the lozenge or gum, or redness or swelling of the skin around the patch that does not go away.   Do not use any other product containing nicotine while using a nicotine replacement product.   Talk to your caregiver before using these products if you have diabetes, heart disease, asthma, stomach ulcers, you had a recent heart attack, you have high blood pressure that is not controlled with medicine, a history of irregular heartbeat, or you have been prescribed medicine to help you quit smoking.  5. BE PREPARED FOR RELAPSE OR DIFFICULT SITUATIONS  Most relapses occur within the first 3 months after quitting. Do not be discouraged if you  start smoking again. Remember, most people try several times before they finally quit.   You may have symptoms of withdrawal because your body is used to nicotine. You may crave cigarettes, be irritable, feel very hungry, cough often, get headaches, or have difficulty concentrating.   The withdrawal symptoms are only temporary. They are strongest when you  first quit, but they will go away within 10 to 14 days.  Here are some difficult situations to watch for:  Alcohol. Avoid drinking alcohol. Drinking lowers your chances of successfully quitting.   Caffeine. Try to reduce the amount of caffeine you consume. It also lowers your chances of successfully quitting.   Other smokers. Being around smoking can make you want to smoke. Avoid smokers.   Weight gain. Many smokers will gain weight when they quit, usually less than 10 pounds. Eat a healthy diet and stay active. Do not let weight gain distract you from your main goal, quitting smoking. Some medicines that help you quit smoking may also help delay weight gain. You can always lose the weight gained after you quit.   Bad mood or depression. There are a lot of ways to improve your mood other than smoking.  If you are having problems with any of these situations, talk to your caregiver. SPECIAL SITUATIONS AND CONDITIONS Studies suggest that everyone can quit smoking. Your situation or condition can give you a special reason to quit.  Pregnant women/new mothers: By quitting, you protect your baby's health and your own.   Hospitalized patients: By quitting, you reduce health problems and help healing.   Heart attack patients: By quitting, you reduce your risk of a second heart attack.   Lung, head, and neck cancer patients: By quitting, you reduce your chance of a second cancer.   Parents of children and adolescents: By quitting, you protect your children from illnesses caused by secondhand smoke.  QUESTIONS TO THINK ABOUT Think about the  following questions before you try to stop smoking. You may want to talk about your answers with your caregiver.  Why do you want to quit?   If you tried to quit in the past, what helped and what did not?   What will be the most difficult situations for you after you quit? How will you plan to handle them?   Who can help you through the tough times? Your family? Friends? Caregiver?   What pleasures do you get from smoking? What ways can you still get pleasure if you quit?  Here are some questions to ask your caregiver:  How can you help me to be successful at quitting?   What medicine do you think would be best for me and how should I take it?   What should I do if I need more help?   What is smoking withdrawal like? How can I get information on withdrawal?  Quitting takes hard work and a lot of effort, but you can quit smoking. FOR MORE INFORMATION  Smokefree.gov (http://www.davis-sullivan.com/) provides free, accurate, evidence-based information and professional assistance to help support the immediate and long-term needs of people trying to quit smoking. Document Released: 08/19/2001 Document Revised: 08/14/2011 Document Reviewed: 06/11/2009 [facility] Patient Information 2012 [place], [place].  Smoking Cessation, Tips for Success YOU CAN QUIT SMOKING If you are ready to quit smoking, congratulations! You have chosen to help yourself be healthier. Cigarettes bring nicotine, tar, carbon monoxide, and other irritants into your body. Your lungs, heart, and blood vessels will be able to work better without these poisons. There are many different ways to quit smoking. Nicotine gum, nicotine patches, a nicotine inhaler, or nicotine nasal spray can help with physical craving. Hypnosis, support groups, and medicines help break the habit of smoking. Here are some tips to help you quit for good.  Throw away all cigarettes.   Clean and remove all  ashtrays from your home, work, and car.   On a card,  write down your reasons for quitting. Carry the card with you and read it when you get the urge to smoke.   Cleanse your body of nicotine. Drink enough water and fluids to keep your urine clear or pale yellow. Do this after quitting to flush the nicotine from your body.   Learn to predict your moods. Do not let a bad situation be your excuse to have a cigarette. Some situations in your life might tempt you into wanting a cigarette.   Never have "just one" cigarette. It leads to wanting another and another. Remind yourself of your decision to quit.   Change habits associated with smoking. If you smoked while driving or when feeling stressed, try other activities to replace smoking. Stand up when drinking your coffee. Brush your teeth after eating. Sit in a different chair when you read the paper. Avoid alcohol while trying to quit, and try to drink fewer caffeinated beverages. Alcohol and caffeine may urge you to smoke.   Avoid foods and drinks that can trigger a desire to smoke, such as sugary or spicy foods and alcohol.   Ask people who smoke not to smoke around you.   Have something planned to do right after eating or having a cup of coffee. Take a walk or exercise to perk you up. This will help to keep you from overeating.   Try a relaxation exercise to calm you down and decrease your stress. Remember, you may be tense and nervous for the first 2 weeks after you quit, but this will pass.   Find new activities to keep your hands busy. Play with a pen, coin, or rubber band. Doodle or draw things on paper.   Brush your teeth right after eating. This will help cut down on the craving for the taste of tobacco after meals. You can try mouthwash, too.   Use oral substitutes, such as lemon drops, carrots, a cinnamon stick, or chewing gum, in place of cigarettes. Keep them handy so they are available when you have the urge to smoke.   When you have the urge to smoke, try deep breathing.    Designate your home as a nonsmoking area.   If you are a heavy smoker, ask your caregiver about a prescription for nicotine chewing gum. It can ease your withdrawal from nicotine.   Reward yourself. Set aside the cigarette money you save and buy yourself something nice.   Look for support from others. Join a support group or smoking cessation program. Ask someone at home or at work to help you with your plan to quit smoking.   Always ask yourself, "Do I need this cigarette or is this just a reflex?" Tell yourself, "Today, I choose not to smoke," or "I do not want to smoke." You are reminding yourself of your decision to quit, even if you do smoke a cigarette.  HOW WILL I FEEL WHEN I QUIT SMOKING?  The benefits of not smoking start within days of quitting.   You may have symptoms of withdrawal because your body is used to nicotine (the addictive substance in cigarettes). You may crave cigarettes, be irritable, feel very hungry, cough often, get headaches, or have difficulty concentrating.   The withdrawal symptoms are only temporary. They are strongest when you first quit but will go away within 10 to 14 days.   When withdrawal symptoms occur, stay in control. Think about your  reasons for quitting. Remind yourself that these are signs that your body is healing and getting used to being without cigarettes.   Remember that withdrawal symptoms are easier to treat than the major diseases that smoking can cause.   Even after the withdrawal is over, expect periodic urges to smoke. However, these cravings are generally short-lived and will go away whether you smoke or not. Do not smoke!   If you relapse and smoke again, do not lose hope. Most smokers quit 3 times before they are successful.   If you relapse, do not give up! Plan ahead and think about what you will do the next time you get the urge to smoke.  LIFE AS A NONSMOKER: MAKE IT FOR A MONTH, MAKE IT FOR LIFE Day 1: Hang this page where  you will see it every day. Day 2: Get rid of all ashtrays, matches, and lighters. Day 3: Drink water. Breathe deeply between sips. Day 4: Avoid places with smoke-filled air, such as bars, clubs, or the smoking section of restaurants. Day 5: Keep track of how much money you save by not smoking. Day 6: Avoid boredom. Keep a good book with you or go to the movies. Day 7: Reward yourself! One week without smoking! Day 8: Make a dental appointment to get your teeth cleaned. Day 9: Decide how you will turn down a cigarette before it is offered to you. Day 10: Review your reasons for quitting. Day 11: Distract yourself. Stay active to keep your mind off smoking and to relieve tension. Take a walk, exercise, read a book, do a crossword puzzle, or try a new hobby. Day 12: Exercise. Get off the bus before your stop or use stairs instead of escalators. Day 13: Call on friends for support and encouragement. Day 14: Reward yourself! Two weeks without smoking! Day 15: Practice deep breathing exercises. Day 16: Bet a friend that you can stay a nonsmoker. Day 17: Ask to sit in nonsmoking sections of restaurants. Day 18: Hang up "No Smoking" signs. Day 19: Think of yourself as a nonsmoker. Day 20: Each morning, tell yourself you will not smoke. Day 21: Reward yourself! Three weeks without smoking! Day 22: Think of smoking in negative ways. Remember how it stains your teeth, gives you bad breath, and leaves you short of breath. Day 23: Eat a nutritious breakfast. Day 24:Do not relive your days as a smoker. Day 25: Hold a pencil in your hand when talking on the telephone. Day 26: Tell all your friends you do not smoke. Day 27: Think about how much better food tastes. Day 28: Remember, one cigarette is one too many. Day 29: Take up a hobby that will keep your hands busy. Day 30: Congratulations! One month without smoking! Give yourself a big reward. Your caregiver can direct you to community resources or  hospitals for support, which may include:  Group support.   Education.   Hypnosis.   Subliminal therapy.  Document Released: 05/23/2004 Document Revised: 08/14/2011 Document Reviewed: 06/11/2009 [facility] Patient Information 2012 [place], [place].  Blood Sugar Monitoring, Adult GLUCOSE METERS FOR SELF-MONITORING OF BLOOD GLUCOSE  It is important to be able to correctly measure your blood sugar (glucose). You can use a blood glucose monitor (a small battery-operated device) to check your glucose level at any time. This allows you and your caregiver to monitor your diabetes and to determine how well your treatment plan is working. The process of monitoring your blood glucose with a glucose meter  is called self-monitoring of blood glucose (SMBG). When people with diabetes control their blood sugar, they have better health. To test for glucose with a typical glucose meter, place the disposable strip in the meter. Then place a small sample of blood on the "test strip." The test strip is coated with chemicals that combine with glucose in blood. The meter measures how much glucose is present. The meter displays the glucose level as a number. Several new models can record and store a number of test results. Some models can connect to personal computers to store test results or print them out.  Newer meters are often easier to use than older models. Some meters allow you to get blood from places other than your fingertip. Some new models have automatic timing, error codes, signals, or barcode readers to help with proper adjustment (calibration). Some meters have a large display screen or spoken instructions for people with visual impairments.  INSTRUCTIONS FOR USING GLUCOSE METERS  Wash your hands with soap and warm water, or clean the area with alcohol. Dry your hands completely.   Prick the side of your fingertip with a lancet (a sharp-pointed tool used by hand).   Hold the hand down and gently milk  the finger until a small drop of blood appears. Catch the blood with the test strip.   Follow the instructions for inserting the test strip and using the SMBG meter. Most meters require the meter to be turned on and the test strip to be inserted before applying the blood sample.   Record the test result.   Read the instructions carefully for both the meter and the test strips that go with it. Meter instructions are found in the user manual. Keep this manual to help you solve any problems that may arise. Many meters use "error codes" when there is a problem with the meter, the test strip, or the blood sample on the strip. You will need the manual to understand these error codes and fix the problem.   New devices are available such as laser lancets and meters that can test blood taken from "alternative sites" of the body, other than fingertips. However, you should use standard fingertip testing if your glucose changes rapidly. Also, use standard testing if:   You have eaten, exercised, or taken insulin in the past 2 hours.   You think your glucose is low.   You tend to not feel symptoms of low blood glucose (hypoglycemia).   You are ill or under stress.   Clean the meter as directed by the manufacturer.   Test the meter for accuracy as directed by the manufacturer.   Take your meter with you to your caregiver's office. This way, you can test your glucose in front of your caregiver to make sure you are using the meter correctly. Your caregiver can also take a sample of blood to test using a routine lab method. If values on the glucose meter are close to the lab results, you and your caregiver will see that your meter is working well and you are using good technique. Your caregiver will advise you about what to do if the results do not match.  FREQUENCY OF TESTING  Your caregiver will tell you how often you should check your blood glucose. This will depend on your type of diabetes, your current  level of diabetes control, and your types of medicines. The following are general guidelines, but your care plan may be different. Record all your readings  and the time of day you took them for review with your caregiver.   Diabetes type 1.   When you are using insulin with good diabetic control (either multiple daily injections or via a pump), you should check your glucose 4 times a day.   If your diabetes is not well controlled, you may need to monitor more frequently, including before meals and 2 hours after meals, at bedtime, and occasionally between 2 a.m. and 3 a.m.   You should always check your glucose before a dose of insulin or before changing the rate on your insulin pump.   Diabetes type 2.   Guidelines for SMBG in diabetes type 2 are not as well defined.   If you are on insulin, follow the guidelines above.   If you are on medicines, but not insulin, and your glucose is not well controlled, you should test at least twice daily.   If you are not on insulin, and your diabetes is controlled with medicines or diet alone, you should test at least once daily, usually before breakfast.   A weekly profile will help your caregiver advise you on your care plan. The week before your visit, check your glucose before a meal and 2 hours after a meal at least daily. You may want to test before and after a different meal each day so you and your caregiver can tell how well controlled your blood sugars are throughout the course of a 24 hour period.   Gestational diabetes (diabetes during pregnancy).   Frequent testing is often necessary. Accurate timing is important.   If you are not on insulin, check your glucose 4 times a day. Check it before breakfast and 1 hour after the start of each meal.   If you are on insulin, check your glucose 6 times a day. Check it before each meal and 1 hour after the first bite of each meal.   General guidelines.   More frequent testing is required at the  start of insulin treatment. Your caregiver will instruct you.   Test your glucose any time you suspect you have low blood sugar (hypoglycemia).   You should test more often when you change medicines, when you have unusual stress or illness, or in other unusual circumstances.  OTHER THINGS TO KNOW ABOUT GLUCOSE METERS  Measurement Range. Most glucose meters are able to read glucose levels over a broad range of values from as low as 0 to as high as 600 mg/dL. If you get an extremely high or low reading from your meter, you should first confirm it with another reading. Report very high or very low readings to your caregiver.   Whole Blood Glucose versus Plasma Glucose. Some older home glucose meters measure glucose in your whole blood. In a lab or when using some newer home glucose meters, the glucose is measured in your plasma (one component of blood). The difference can be important. It is important for you and your caregiver to know whether your meter gives its results as "whole blood equivalent" or "plasma equivalent."   Display of High and Low Glucose Values. Part of learning how to operate a meter is understanding what the meter results mean. Know how high and low glucose concentrations are displayed on your meter.   Factors that Affect Glucose Meter Performance. The accuracy of your test results depends on many factors and varies depending on the brand and type of meter. These factors include:   Low red blood cell count (  anemia).   Substances in your blood (such as uric acid, vitamin C, and others).   Environmental factors (temperature, humidity, altitude).   Name-brand versus generic test strips.   Calibration. Make sure your meter is set up properly. It is a good idea to do a calibration test with a control solution recommended by the manufacturer of your meter whenever you begin using a fresh bottle of test strips. This will help verify the accuracy of your meter.   Improperly stored,  expired, or defective test strips. Keep your strips in a dry place with the lid on.   Soiled meter.   Inadequate blood sample.  NEW TECHNOLOGIES FOR GLUCOSE TESTING Alternative site testing Some glucose meters allow testing blood from alternative sites. These include the:  Upper arm.   Forearm.   Base of the thumb.   Thigh.  Sampling blood from alternative sites may be desirable. However, it may have some limitations. Blood in the fingertips show changes in glucose levels more quickly than blood in other parts of the body. This means that alternative site test results may be different from fingertip test results, not because of the meter's ability to test accurately, but because the actual glucose concentration can be different.  Continuous Glucose Monitoring Devices to measure your blood glucose continuously are available, and others are in development. These methods can be more expensive than self-monitoring with a glucose meter. However, it is uncertain how effective and reliable these devices are. Your caregiver will advise you if this approach makes sense for you. IF BLOOD SUGARS ARE CONTROLLED, PEOPLE WITH DIABETES REMAIN HEALTHIER.  SMBG is an important part of the treatment plan of patients with diabetes mellitus. Below are reasons for using SMBG:   It confirms that your glucose is at a specific, healthy level.   It detects hypoglycemia and severe hyperglycemia.   It allows you and your caregiver to make adjustments in response to changes in lifestyle for individuals requiring medicine.   It determines the need for starting insulin therapy in temporary diabetes that happens during pregnancy (gestational diabetes).  Document Released: 08/28/2003 Document Revised: 08/14/2011 Document Reviewed: 12/19/2010 [facility] Patient Information 2012 [place], [place].  Monitoring for Diabetes There are two blood tests that help you monitor and manage your diabetes. These include:  An A1c  (hemoglobin A1c) test.   This test is an average of your glucose (or blood sugar) control over the past 3 months.   This is recommended as a way for you and your caregiver to understand how well your glucose levels are controlled on the average.   Your A1c goal will be determined by your caregiver, but it is usually best if it is less than 6.5% to 7.0%.   Glucose (sugar) attaches itself to red blood cells. The amount of glucose then can then be measured. The amount of glucose on the cells depends on how high your blood glucose has been.   SMBG test (self-monitoring blood glucose).   Using a blood glucose monitor (meter) to do SMBG testing is an easy way to monitor the amount of glucose in your blood and can help you improve your control. The monitor will tell you what your blood glucose is at that very moment. Every person with diabetes should have a blood glucose monitor and know how to use it. The better you control your blood sugar on a daily basis, the better your A1c levels will be.  HOW OFTEN SHOULD I HAVE AN A1C LEVEL?  Every  3 months if your diabetes is not well controlled or if therapy has changed.   Every 6 months if you are meeting your treatment goals.  HOW OFTEN SHOULD I DO SMBG TESTING?  Your caregiver will recommend how often you should test. Testing times are based on the kind of medicine you take, type of diabetes you have, and your blood glucose control. Testing times can include:  Type 1 diabetes: test 3 or 4 times a day or as directed.   Type 2 diabetes and if you are taking insulin and diabetes pills: test 3 or 4 times a day or as directed.   If you are taking diabetes pills only and not reaching your target A1c: test 2 to 4 times a day or as directed.   If you are taking diabetes pills and are controlling your diabetes well with diet and exercise, your caregiver will help you decide what is appropriate.  WHAT TIME OF DAY SHOULD I TEST?  The best time of day to test  your blood glucose depends on medications, mealtimes, exercise, and blood glucose control. It is best to test at different times because this will help you know how you are doing throughout the day. Your caregiver will help you decide what is best. WHAT SHOULD MY BLOOD GLUCOSE BE? Blood glucose target goals may vary depending on each persons needs, whether they have type 1 or type 2 diabetes or what medications they are taking. However, as a general rule, blood glucose should be:  Before meals...70-130 mg/dl.   After meals .Marland Kitchen...less than 180 mg/dl.  CHECK YOUR BLOOD GLUCOSE IF:  You have symptoms of low blood sugar (hypoglycemia), which may include dizziness, shaking, sweating, chills and confusion.   You have symptoms of high blood sugar (hyperglycemia), which may include sleepiness, blurred vision, frequent urination and excessive thirst.   You are learning how meals, physical activity and medicine affect your blood glucose level. The more you learn about how various foods, your medications, and activities affect you, the better job you will do of taking care of yourself.   You have a job in which poor control could cause safety problems while driving or operating machinery.  CHECK YOUR BLOOD SUGAR MORE FREQUENTLY:  If you have medication or dietary changes.   If you begin taking other kinds of medicines.   If you become sick or your level of stress increases. With an illness, your blood sugar may even be high without eating.   Before and after exercise.  Follow your caregiver's testing recommendations during this time.  TO DISPOSE OF SHARPS: Each city or state may have different regulations. Check with your public works or Engineer, structural.  Sharps containers can be purchased from [employer] all used sharps in a container. You do not need to replace any protective covers over the needle or break the needle.   Sharps should be contained in a ridge, leakproof,  puncture-resistant container.   Plastic detergent bottle.   Bleach bottle.   When container is almost full, add a solution that is 1 part laundry bleach and 9 parts tap water (it is ok to use undiluted bleach if you wish). You may want to wear gloves since bleach can damage tissue. Let the solution sit for 30 minutes.   Carefully pour all the liquid into the sanitary sewer. Be sure to prevent the sharps from falling out.   Once liquid is drained, reseal the container with lid and tape it  shut with duct tape. This will prevent the cap from coming off.   Dispose of the container with your regular household trash and waste. It is a good idea to let your trash hauler know that you will be disposing of sharps.  Document Released: 08/28/2003 Document Revised: 08/14/2011 Document Reviewed: 02/26/2009 [facility] Patient Information 2012 [place], [place].  Stroke A stroke (cerebrovascular accident) is the sudden death of brain tissue. It is a medical emergency. A stroke can cause permanent loss of brain function. This can cause problems with different parts of your body. A transient ischemic attack (TIA) is different because it does not cause permanent damage. A TIA is a short-lived problem of poor blood flow affecting a part of the brain. A TIA is also a serious problem because having a TIA greatly increases the chances of having a stroke. When symptoms first develop, you cannot know if the problem might be a stroke or TIA. CAUSES  A stroke is caused by a decrease of oxygen supply to an area of your brain. It is usually the result of a small blood clot or collection of cholesterol or fat (plaque) that blocks blood flow in the brain. A stroke can also be caused by blocked or damaged carotid arteries. Bleeding in the brain can cause, or accompany, a stroke. RISK FACTORS  High blood pressure (hypertension).   High cholesterol.   Diabetes.   Heart disease.   The buildup of fatty deposits in the blood  vessels (peripheral artery disease or atherosclerosis).   An abnormal heart rhythm (atrial fibrillation).   Obesity.   Smoking.   Taking oral contraceptives (especially in combination with smoking).   Physical inactivity.   A diet high in fats, salt (sodium), and calories.   Alcohol use.   Use of illegal drugs (especially cocaine and methamphetamine).   Being female.   Being African American.   Being over the age of 37.   Family history of stroke.   Previous history of blood clots, a "warning stroke" (transient ischemic attack, TIA), or heart attack.   Sickle cell disease.  SYMPTOMS  These symptoms usually develop suddenly, or may be newly present upon awakening from sleep:  Sudden weakness or numbness of the face, arm, or leg, especially on one side of the body.   Sudden confusion.   Trouble speaking (aphasia) or understanding.   Sudden trouble seeing in one or both eyes.   Sudden trouble walking.   Dizziness.   Loss of balance or coordination.   Sudden severe headache with no known cause.   Trouble reading or writing.  DIAGNOSIS  Your caregiver can often determine the presence or absence of a stroke based on your symptoms, history, and physical exam. A computerized X-ray scan (CT or CAT scan) of the brain is usually performed to confirm the stroke, to look for causes, and to determine the severity. Other tests may be done to find the cause of the stroke. These tests may include:  An EKG and heart monitoring.   An ultrasound evaluation of the heart (echocardiogram).   An ultrasound evaluation of your carotid arteries.   A computerized magnetic scan (MRI).   A scan of the brain circulation.   Blood oxygen level monitoring.   Blood tests.  PREVENTION  The risk of a stroke can be decreased by appropriately treating high blood pressure, high cholesterol, diabetes, heart disease, and obesity and by quitting smoking, limiting alcohol, and staying physically  active. TREATMENT  Time is of the  essence. It is important to seek treatment within 3 to 4 hours of the start of symptoms because you may receive a "clot dissolving" medicine that cannot be given after that time. Even if you do not know when your symptoms began, get treatment as soon as possible. After the 4 hour window has passed, treatment may include rest, oxygen, intravenous (IV) fluids, and medicines to thin the blood (to prevent another stroke). Treatment of stroke depends on the duration, severity, and cause of your symptoms. Medicines and diet may be used to address diabetes, high blood pressure, and other risk factors. Physical, speech, and occupational therapists will assess you and work to improve any functions impaired by the stroke. Measures will be taken to prevent short-term and long-term complications, including infection from breathing foreign material into the lungs (aspiration pneumonia), blood clots in the legs, bedsores, and falls. Rarely, surgery may be needed to remove large blood clots or to open up blocked arteries. HOME CARE INSTRUCTIONS   Medicines: Aspirin and blood thinners may be used to prevent another stroke. Blood thinners need to be used exactly as instructed. Medicines may also be used to control risk factors for a stroke. Be sure you understand all your medicine instructions.   Diet: Certain diets may be prescribed to address high blood pressure, high cholesterol, diabetes, or obesity.   A low-sodium, low-saturated fat, low-trans fat, low-cholesterol diet is recommended to manage high blood pressure.   A low-saturated fat, low-trans fat, low-cholesterol, and high-fiber diet may control cholesterol levels.   A controlled-carbohydrate, controlled-sugar diet is recommended to manage diabetes.   A reduced-calorie, low-sodium, low-saturated fat, low-trans fat, low-cholesterol diet is recommended to manage obesity.   A diet that includes 5 or more servings of fruits  and vegetables a day may reduce the risk of stroke. Foods may need to be a certain consistency (soft or pureed), or small bites may need to be taken in order to avoid aspirating or choking.   Maintain a healthy weight.   Stay physically active. It is recommended that you get at least 30 minutes of activity on most or all days.   Do not smoke.   Limit alcohol use. Moderate alcohol use is considered to be:   No more than 2 drinks per day for men.   No more than 1 drink per day for nonpregnant women.   Stop drug abuse.   Home safety: A safe home environment is important to reduce the risk of falls. Your caregiver may arrange for specialists to evaluate your home. Having grab bars in the bedroom and bathroom is often important. Your caregiver may arrange for equipment to be used at home, such as raised toilets and a seat for the shower.   Physical, occupational, and speech therapy: Ongoing therapy may be needed to maximize your recovery after a stroke. If you have been advised to use a walker or a cane, use it at all times. Be sure to keep your therapy appointments.   Follow all instructions for follow-up with your caregiver. This is very important. This includes any referrals, physical therapy, rehabilitation, and lab tests. Proper treatment also prevents another stroke from occurring.  SEEK IMMEDIATE MEDICAL CARE IF:   You have sudden weakness or numbness of the face, arm, or leg, especially on one side of the body.   You have sudden confusion.   You have trouble speaking (aphasia) or understanding.   You have sudden trouble seeing in one or both eyes.   You  have sudden trouble walking.   You have dizziness.   You have a loss of balance or coordination.   You have a sudden, severe headache with no known cause.   You have new chest pain or an irregular heartbeat.  Any of these symptoms may represent a serious problem that is an emergency. Do not wait to see if the symptoms will  go away. Get medical help right away. Call your local emergency services (911 in [place] Do not drive yourself to the hospital. Document Released: 08/25/2005 Document Revised: 08/14/2011 Document Reviewed: 04/14/2011 [facility] Patient Information 2012 [place], [place].

## 2012-02-29 NOTE — Progress Notes (Signed)
Patient ID: Elizabeth [name], female   DOB: 02-Jan-1941, 71 y.o.   MRN: [id number] Patient ID: Elizabeth [name], female   DOB: 02-08-1941, 71 y.o.   MRN: [id number] Stroke Team Progress Note  HISTORY Elizabeth [name] is an 71 y.o. female with a history of diabetes mellitus, hypertension and hyperlipidemia who presented to 10 PM hospital with speech difficulty as well as symptoms of vertigo and unsteady gait. She was last known normal at 7 AM this morning. Symptoms were present when she woke up again at 9 AM. Deficits resolved rapidly after arriving at the emergency room. CT scan of her head showed no acute intracranial abnormality. She's been taking aspirin daily. She was not deemed a candidate for TPA because of rapidly resolving deficits. Subsequent MRI study showed left paracentral ischemic pontine infarction. 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  SUBJECTIVE Patient is doing well this morning and reports speech is better. NPO since MN and waiting for angio today.  OBJECTIVE Most recent Vital Signs: Temp: 98.4 F (36.9 C) (06/23 0559) Temp src: Oral (06/23 0559) BP: 166/77 mmHg (06/23 0559) Pulse Rate: 56  (06/23 0559) Respiratory Rate: 20 O2 Saturation: 99%  CBG (last 3)   Basename 02/29/12 0713 02/28/12 2221 02/28/12 1645  GLUCAP 110* 167* 99   Intake/Output from previous day: 06/22 0701 - 06/23 0700 In: 1725 [P.O.:800; I.V.:925] Out: -   IV Fluid Intake:      . sodium chloride 75 mL/hr at 02/28/12 1840  . DISCONTD: sodium chloride 75 mL/hr at 02/27/12 2102   Medications     . amLODipine  10 mg Oral Daily  . clopidogrel  75 mg Oral Q breakfast  . escitalopram  20 mg Oral Daily  . hydrochlorothiazide  12.5 mg Oral Daily  . insulin aspart  0-15 Units Subcutaneous TID WC  . oxybutynin  15 mg Oral Daily  . pantoprazole  40 mg Oral Q1200  . rosuvastatin  20 mg Oral q1800  . DISCONTD: amLODipine  5 mg Oral Daily  . DISCONTD:  aspirin  325 mg Oral Daily  PRN acetaminophen, acetaminophen, acetaminophen, albuterol, ALPRAZolam, ipratropium, metoprolol, ondansetron (ZOFRAN) IV, senna-docusate  Diet:  NPO think liquids Activity:  Bathroom privileges; Up with assistance. DVT Prophylaxis:  SCDs  Studies: CBC    Component Value Date/Time   WBC 7.0 02/27/2012 1120   RBC 4.81 02/27/2012 1120   HGB 13.9 02/27/2012 1120   HCT 39.9 02/27/2012 1120   PLT 283 02/27/2012 1120   MCV 83.0 02/27/2012 1120   MCH 28.9 02/27/2012 1120   MCHC 34.8 02/27/2012 1120   RDW 13.2 02/27/2012 1120   MONOABS 0.6 08/29/2008 1349   EOSABS 0.2 08/29/2008 1349   BASOSABS 0.1 08/29/2008 1349   CMP    Component Value Date/Time   NA 137 02/28/2012 0500   K 3.6 02/28/2012 0500   CL 102 02/28/2012 0500   CO2 24 02/28/2012 0500   GLUCOSE 110* 02/28/2012 0500   BUN 13 02/28/2012 0500   CREATININE 0.77 02/28/2012 0500   CALCIUM 9.0 02/28/2012 0500   PROT 6.7 08/29/2008 1349   ALBUMIN 3.9 08/29/2008 1349   AST 21 08/29/2008 1349   ALT 20 08/29/2008 1349   ALKPHOS 74 08/29/2008 1349   BILITOT 0.7 08/29/2008 1349   GFRNONAA 84* 02/28/2012 0500   GFRAA >90 02/28/2012 0500   COAGS Lab Results  Component Value Date   INR 1.07 02/27/2012  Lipid Panel    Component Value Date/Time   CHOL 169 02/28/2012 0500   TRIG 125 02/28/2012 0500   HDL 49 02/28/2012 0500   CHOLHDL 3.4 02/28/2012 0500   VLDL 25 02/28/2012 0500   LDLCALC 95 02/28/2012 0500   HgbA1C  Lab Results  Component Value Date   HGBA1C 6.3* 02/28/2012   Urine Drug Screen  No results found for this basename: labopia,  cocainscrnur,  labbenz,  amphetmu,  thcu,  labbarb    Alcohol Level No results found for this basename: eth     Results for orders placed during the hospital encounter of 02/27/12 (from the past 24 hour(s))  GLUCOSE, CAPILLARY     Status: Abnormal   Collection Time   02/28/12 11:36 AM      Component Value Range   Glucose-Capillary 100 (*) 70 - 99 mg/dL  CARDIAC  PANEL(CRET KIN+CKTOT+MB+TROPI)     Status: Abnormal   Collection Time   02/28/12 11:42 AM      Component Value Range   Total CK 106  7 - 177 U/L   CK, MB 2.8  0.3 - 4.0 ng/mL   Troponin I <0.30  <0.30 ng/mL   Relative Index 2.6 (*) 0.0 - 2.5  GLUCOSE, CAPILLARY     Status: Normal   Collection Time   02/28/12  4:45 PM      Component Value Range   Glucose-Capillary 99  70 - 99 mg/dL  GLUCOSE, CAPILLARY     Status: Abnormal   Collection Time   02/28/12 10:21 PM      Component Value Range   Glucose-Capillary 167 (*) 70 - 99 mg/dL   Comment 1 Documented in Chart     Comment 2 Notify RN    GLUCOSE, CAPILLARY     Status: Abnormal   Collection Time   02/29/12  7:13 AM      Component Value Range   Glucose-Capillary 110 (*) 70 - 99 mg/dL   Comment 1 Documented in Chart     Comment 2 Notify RN      Dg Chest 2 View  02/27/2012  *RADIOLOGY REPORT*  Clinical Data: Stroke, hypertension.  CHEST - 2 VIEW  Comparison: 12/15/2010  Findings: Heart and mediastinal contours are within normal limits. No focal opacities or effusions.  No acute bony abnormality.  IMPRESSION: No active cardiopulmonary disease.  Original Report Authenticated By: Cyndie Chime, M.D.   Ct Head Wo Contrast  02/27/2012  *RADIOLOGY REPORT*  Clinical Data: Code Stroke, slurred speech, unable to walk or talk  CT HEAD WITHOUT CONTRAST  Technique:  Contiguous axial images were obtained from the base of the skull through the vertex without contrast.  Comparison: 03/31/2006  Findings: Mild atrophy. Normal ventricular morphology. No midline shift or mass effect. Small vessel chronic ischemic changes of deep cerebral white matter. Old periventricular white matter infarcts. Scattered small vessel chronic ischemic changes of deep cerebral white matter. No intracranial hemorrhage, mass lesion, or evidence of acute infarction. No extra-axial fluid collections. Posterior fossa unremarkable. Visualized paranasal sinuses and mastoid air cells clear.  No acute osseous findings.  IMPRESSION: Atrophy with small vessel chronic ischemic changes of deep cerebral white matter. Old periventricular white matter infarcts. No definite acute intracranial abnormalities.  Findings called to Dr. Deretha Emory on 02/27/2012 at 1120 hrs.  Original Report Authenticated By: Lollie Marrow, M.D.   Mr [facility] Wo Contrast  02/27/2012  *RADIOLOGY REPORT*  Clinical Data:  Difficulty talking. Diabetes.  Obesity.  MRI  HEAD WITHOUT CONTRAST MRA HEAD WITHOUT CONTRAST  Technique: Multiplanar, multiecho pulse sequences of the brain and surrounding structures were obtained according to standard protocol without intravenous contrast.  Angiographic images of the head were obtained using MRA technique without contrast.  Comparison: 02/27/2012 CT.  04/11/2010 MR.  MRI HEAD  Findings:  Acute non hemorrhagic left paracentral pontine infarct.  Remote infarcts corona radiata bilaterally.  Remote small thalamic infarcts.  Progressive moderate small vessel disease type changes.  No intracranial mass lesion detected on this unenhanced exam.  Global atrophy without hydrocephalus.  No intracranial hemorrhage.  IMPRESSION: Acute non hemorrhagic left paracentral pontine infarct.  Please see above.  MRA HEAD  Findings: Markedly ectatic vertical cervical segment of the right internal carotid artery.  Focal bulge distal vertical cervical segment left internal carotid artery.  This is probably related to atherosclerotic type changes rather than aneurysm or fibromuscular dysplasia.  Moderate to marked stenosis cavernous segment left internal carotid artery.  Mild narrowing petrous and pre cavernous segment as well as cavernous segment of the right internal carotid artery.  7.8 x 5.8 x 4.9 mm aneurysm anterior communicating artery region.  Moderate middle cerebral artery branch vessel irregularity more notable on the right.  Mild irregularity of the vertebral arteries without high-grade stenosis.  Nonvisualization  right PICA.  Mild to moderate irregularity with mild narrowing basilar artery.  Nonvisualization AICAs.  Moderate irregularity involving proximal and distal aspect of the posterior cerebral arteries and superior cerebellar arteries bilaterally.  IMPRESSION: Prominent intracranial atherosclerotic type changes as detailed above.  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  Critical Value/emergent results were called by telephone at the time of interpretation on 02/27/2012  at 2:20 p.m.  to  Dr. Deretha Emory, who verbally acknowledged these results.  Original Report Authenticated By: Fuller Canada, M.D.   Mr Brain Wo Contrast  02/27/2012  *RADIOLOGY REPORT*  Clinical Data:  Difficulty talking. Diabetes.  Obesity.  MRI HEAD WITHOUT CONTRAST MRA HEAD WITHOUT CONTRAST  Technique: Multiplanar, multiecho pulse sequences of the brain and surrounding structures were obtained according to standard protocol without intravenous contrast.  Angiographic images of the head were obtained using MRA technique without contrast.  Comparison: 02/27/2012 CT.  04/11/2010 MR.  MRI HEAD  Findings:  Acute non hemorrhagic left paracentral pontine infarct.  Remote infarcts corona radiata bilaterally.  Remote small thalamic infarcts.  Progressive moderate small vessel disease type changes.  No intracranial mass lesion detected on this unenhanced exam.  Global atrophy without hydrocephalus.  No intracranial hemorrhage.  IMPRESSION: Acute non hemorrhagic left paracentral pontine infarct.  Please see above.  MRA HEAD  Findings: Markedly ectatic vertical cervical segment of the right internal carotid artery.  Focal bulge distal vertical cervical segment left internal carotid artery.  This is probably related to atherosclerotic type changes rather than aneurysm or fibromuscular dysplasia.  Moderate to marked stenosis cavernous segment left internal carotid artery.  Mild narrowing petrous and pre cavernous segment as well as cavernous segment of  the right internal carotid artery.  7.8 x 5.8 x 4.9 mm aneurysm anterior communicating artery region.  Moderate middle cerebral artery branch vessel irregularity more notable on the right.  Mild irregularity of the vertebral arteries without high-grade stenosis.  Nonvisualization right PICA.  Mild to moderate irregularity with mild narrowing basilar artery.  Nonvisualization AICAs.  Moderate irregularity involving proximal and distal aspect of the posterior cerebral arteries and superior cerebellar arteries bilaterally.  IMPRESSION: Prominent intracranial atherosclerotic type changes as detailed above.  7.8 mm  anterior communicating artery aneurysm.  Critical Value/emergent results were called by telephone at the time of interpretation on 02/27/2012  at 2:20 p.m.  to  Dr. Deretha Emory, who verbally acknowledged these results.  Original Report Authenticated By: Fuller Canada, M.D.   2D Echocardiogram  Pending  A1c  6.3  Physical Exam   GENERAL:   Well nourished, well hydrated, no acute distress.   CARDIOVASCULAR:   Regular rate and rhythm, no thrills or palpable murmurs, S1, S2, no murmur, no rubs or gallops.      Carotid arteries: No carotid bruits.   RESPIRATORY:  Clear to auscultation bilaterally, no wheezes, rhonci or rales  MENTAL STATUS EXAM:    Orientation:  Alert and oriented to person, place and time.      Language:  Speech is minimally dysarthric and language is normal.   CRANIAL NERVES:     CN 2 (Optic):  Visual fields intact to confrontation.     CN 3,4,6 (EOM):  Pupils equal and reactive to light and near full eye movement without nystagmus.      CN 5 (Trigeminal):  Facial sensation is normal, no weakness of masticatory muscles.      CN 7 (Facial):  No facial weakness or asymmetry.      CN 8 (Auditory):  Auditory acuity grossly normal.      CN 9,10 (Glossophar):  The uvula is midline, the palate elevates symmetrically.      CN 11 (spinal access):  Normal sternocleidomastoid and  trapezius strength.      CN 12 (Hypoglossal):  The tongue is midline. No atrophy or fasciculations.   MOTOR:    Full and symmetric strength.  REFLEXES:     Reflexes are intact. Plantars down.  COORDINATION:     Intact finger-to-nose, heel-to-shin, and rapid alternating movements.  SENSATION:     Intact to touch.  GAIT:     Deferred.  ASSESSMENT 71 y.o. 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 2. 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   Stroke Risk Factors - diabetes mellitus, hyperlipidemia, hypertension and smoking   Hospital day # 2  TREATMENT/PLAN -IR saw her yesterday and consented for diagnostic angio this am. Started hydrating yesterday with 75cc/hr NS. He will make further decisions regarding treatment of her aneurysm based on angio. -PT consult, OT consult  -Echocardiogram pending -Continue prophylactic therapy- Plavix 75 mg per day  -Risk factor modification  -Telemetry monitoring  Kipp Laurence, MD [facility] Pager: [phone] 02/29/2012 7:49 AM

## 2012-02-29 NOTE — Progress Notes (Signed)
Pt discharged home with husband. All education and fololow up appointments reviewed. All questions answered. Elmer Sow, RN

## 2012-03-01 ENCOUNTER — Other Ambulatory Visit (HOSPITAL_COMMUNITY): Payer: Self-pay | Admitting: Interventional Radiology

## 2012-03-01 DIAGNOSIS — I729 Aneurysm of unspecified site: Secondary | ICD-10-CM

## 2012-03-02 ENCOUNTER — Telehealth (HOSPITAL_COMMUNITY): Payer: Self-pay

## 2012-03-02 NOTE — Telephone Encounter (Signed)
I spoke with Elizabeth [name] to verify that he knew about [name] apt on July 1st.

## 2012-03-08 ENCOUNTER — Other Ambulatory Visit (HOSPITAL_COMMUNITY): Payer: Self-pay | Admitting: Interventional Radiology

## 2012-03-08 ENCOUNTER — Ambulatory Visit (HOSPITAL_COMMUNITY)
Admission: RE | Admit: 2012-03-08 | Discharge: 2012-03-08 | Disposition: A | Discharge status: Home / Self Care | Payer: Medicare Other | Source: Ambulatory Visit | Attending: Interventional Radiology | Admitting: Interventional Radiology

## 2012-03-08 DIAGNOSIS — I729 Aneurysm of unspecified site: Secondary | ICD-10-CM

## 2012-03-08 DIAGNOSIS — Z8673 Personal history of transient ischemic attack (TIA), and cerebral infarction without residual deficits: Secondary | ICD-10-CM

## 2012-03-10 ENCOUNTER — Other Ambulatory Visit: Payer: Self-pay | Admitting: Radiology

## 2012-03-19 MED ORDER — DEXAMETHASONE SODIUM PHOSPHATE 10 MG/ML IJ SOLN
INTRAMUSCULAR | Status: AC
  Filled 2012-03-19: qty 1

## 2012-03-24 ENCOUNTER — Encounter (HOSPITAL_COMMUNITY): Payer: Self-pay

## 2012-03-24 ENCOUNTER — Other Ambulatory Visit: Payer: Self-pay | Admitting: Radiology

## 2012-03-24 ENCOUNTER — Encounter (HOSPITAL_COMMUNITY)
Admission: RE | Admit: 2012-03-24 | Discharge: 2012-03-24 | Disposition: A | Discharge status: Home / Self Care | Payer: Medicare Other | Source: Ambulatory Visit | Attending: Interventional Radiology | Admitting: Interventional Radiology

## 2012-03-24 LAB — PROTIME-INR
INR: 1 (ref 0.00–1.49)
Prothrombin Time: 13.4 seconds (ref 11.6–15.2)

## 2012-03-24 LAB — DIFFERENTIAL
Basophils Absolute: 0 10*3/uL (ref 0.0–0.1)
Basophils Relative: 1 % (ref 0–1)
Eosinophils Absolute: 0.2 10*3/uL (ref 0.0–0.7)
Eosinophils Relative: 2 % (ref 0–5)
Lymphocytes Relative: 25 % (ref 12–46)
Lymphs Abs: 2.1 10*3/uL (ref 0.7–4.0)
Monocytes Absolute: 0.5 10*3/uL (ref 0.1–1.0)
Monocytes Relative: 6 % (ref 3–12)
Neutro Abs: 5.4 10*3/uL (ref 1.7–7.7)
Neutrophils Relative %: 66 % (ref 43–77)

## 2012-03-24 LAB — CBC
HCT: 40.1 % (ref 36.0–46.0)
Hemoglobin: 13.9 g/dL (ref 12.0–15.0)
MCH: 29.1 pg (ref 26.0–34.0)
MCHC: 34.7 g/dL (ref 30.0–36.0)
MCV: 83.9 fL (ref 78.0–100.0)
Platelets: 264 10*3/uL (ref 150–400)
RBC: 4.78 MIL/uL (ref 3.87–5.11)
RDW: 13.4 % (ref 11.5–15.5)
WBC: 8.1 10*3/uL (ref 4.0–10.5)

## 2012-03-24 LAB — COMPREHENSIVE METABOLIC PANEL
ALT: 20 U/L (ref 0–35)
AST: 21 U/L (ref 0–37)
Albumin: 4 g/dL (ref 3.5–5.2)
Alkaline Phosphatase: 80 U/L (ref 39–117)
BUN: 13 mg/dL (ref 6–23)
CO2: 32 mEq/L (ref 19–32)
Calcium: 10.4 mg/dL (ref 8.4–10.5)
Chloride: 96 mEq/L (ref 96–112)
Creatinine, Ser: 0.93 mg/dL (ref 0.50–1.10)
GFR calc Af Amer: 71 mL/min — ABNORMAL LOW (ref >90)
GFR calc non Af Amer: 61 mL/min — ABNORMAL LOW (ref >90)
Glucose, Bld: 133 mg/dL — ABNORMAL HIGH (ref 70–99)
Potassium: 4.1 mEq/L (ref 3.5–5.1)
Sodium: 138 mEq/L (ref 135–145)
Total Bilirubin: 0.5 mg/dL (ref 0.3–1.2)
Total Protein: 7.7 g/dL (ref 6.0–8.3)

## 2012-03-24 LAB — SURGICAL PCR SCREEN
MRSA, PCR: NEGATIVE
Staphylococcus aureus: NEGATIVE

## 2012-03-24 LAB — APTT: aPTT: 29 seconds (ref 24–37)

## 2012-03-24 NOTE — Progress Notes (Signed)
Pt seen in PAT.  Reports bing followed by Dr. Kirke Corin.  Office visit notes, ECHO, and heart cath notes in EPIC.  A. Zelenak,PA, notified of pt's heart history and order for anesthesia to consult.//L. Love,RN

## 2012-03-24 NOTE — Pre-Procedure Instructions (Signed)
[address] Elizabeth [name]  03/24/2012   Your procedure is scheduled on:  03/31/2012 @8 :30AM.  Report to [facility] at 6:30 AM.  Call this number if you have problems the morning of surgery: [phone]   Remember:   Do not eat food:After Midnight.  May have clear liquids:until Midnight .    Take these medicines the morning of surgery with A SIP OF WATER: XANAX, amlodipine, Aspirin, Plavix, Skelaxin, Prevacid, Ditropan XL.    Do not wear jewelry, make-up or nail polish.  Do not wear lotions, powders, or perfumes. You may wear deodorant.  Do not shave 48 hours prior to surgery. Men may shave face and neck.  Do not bring valuables to the hospital.  Contacts, dentures or bridgework may not be worn into surgery.  Leave suitcase in the car. After surgery it may be brought to your room.  For patients admitted to the hospital, checkout time is 11:00 AM the day of discharge.   Patients discharged the day of surgery will not be allowed to drive home.  Name and phone number of your driver: Elizabeth [name] [phone].  Special Instructions: CHG Shower Use Special Wash: 1/2 bottle night before surgery and 1/2 bottle morning of surgery.   Please read over the following fact sheets that you were given: Pain Booklet, Coughing and Deep Breathing and Surgical Site Infection Prevention

## 2012-03-25 NOTE — Consult Note (Signed)
Anesthesia Chart Review:  Patient is a 72 year old female scheduled for cerebral arteriogram and possible cerebral aneurysm coiling/stent on 03/31/12 by Dr. Corliss Skains.  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  She was hospitalized in late June 2013 for an acute CVA.  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  EKG on 02/27/12 showed NSR, right BBB.  She was actually referred to Cardiologist Dr. Kirke Corin on 11/17/11 by Dr. Christell Constant for finding of an incomplete right BBB.  He recommended an echo (see below).  No stress test was ordered due to the absence of any significant symptoms.     Echo on 11/27/11 showed: - Left ventricle: The cavity size was normal. Wall thickness was increased in a pattern of mild LVH. Systolic function was normal. The estimated ejection fraction was in the range of 60% to 65%. Wall motion was normal; there were no regional wall motion abnormalities. Doppler parameters are consistent with abnormal left ventricular relaxation (grade 1 diastolic dysfunction). Doppler parameters are consistent with elevated mean left atrial filling pressure. - Aortic valve: Valve area: 3.02cm^2(VTI). Valve area: 2.57cm^2 (Vmax). - Mitral valve: Trivial regurgitation.  Cardiac cath on 12/26/04 showed: 1. Mild coronary atherosclerosis (mid LAD 30%, ostial DIAG1 30-40%, CX with minor luminal irregularities, mid RCA 20%) with no flow-limiting stenoses in the major epicardial vessels.  2. Left ventricular ejection fraction of approximately 65% in the setting of ventricular ectopy. No significant mitral regurgitation is noted. The left ventricular end-diastolic pressure is increased at 25 mmHg. At this point would suspect an element of diastolic dysfunction  particularly  given elevated left renal end-diastolic pressure. (Dr. Diona Browner)  CXR on 02/27/12 showed no acute cardiopulmonary disease.  Labs noted.    Anticipate she can proceed if no significant change in her status.  Shonna Chock, PA-C

## 2012-03-31 ENCOUNTER — Encounter (HOSPITAL_COMMUNITY): Payer: Self-pay | Admitting: Vascular Surgery

## 2012-03-31 ENCOUNTER — Ambulatory Visit (HOSPITAL_COMMUNITY)
Admission: RE | Admit: 2012-03-31 | Discharge: 2012-03-31 | Disposition: A | Discharge status: Home / Self Care | Payer: Medicare Other | Source: Ambulatory Visit | Attending: Interventional Radiology | Admitting: Interventional Radiology

## 2012-03-31 ENCOUNTER — Encounter (HOSPITAL_COMMUNITY): Payer: Self-pay | Admitting: *Deleted

## 2012-03-31 ENCOUNTER — Encounter (HOSPITAL_COMMUNITY)
Admission: RE | Disposition: A | Discharge status: Home / Self Care | Payer: Self-pay | Source: Ambulatory Visit | Attending: Interventional Radiology

## 2012-03-31 ENCOUNTER — Inpatient Hospital Stay (HOSPITAL_COMMUNITY)
Admission: RE | Admit: 2012-03-31 | Discharge: 2012-04-01 | DRG: 027 | Disposition: A | Discharge status: Home / Self Care | Payer: Medicare Other | Source: Ambulatory Visit | Attending: Interventional Radiology | Admitting: Interventional Radiology

## 2012-03-31 ENCOUNTER — Ambulatory Visit (HOSPITAL_COMMUNITY): Payer: Medicare Other | Admitting: Vascular Surgery

## 2012-03-31 DIAGNOSIS — K219 Gastro-esophageal reflux disease without esophagitis: Secondary | ICD-10-CM | POA: Diagnosis present

## 2012-03-31 DIAGNOSIS — Z01812 Encounter for preprocedural laboratory examination: Secondary | ICD-10-CM

## 2012-03-31 DIAGNOSIS — I729 Aneurysm of unspecified site: Secondary | ICD-10-CM

## 2012-03-31 DIAGNOSIS — I10 Essential (primary) hypertension: Secondary | ICD-10-CM | POA: Diagnosis present

## 2012-03-31 DIAGNOSIS — R269 Unspecified abnormalities of gait and mobility: Secondary | ICD-10-CM | POA: Diagnosis present

## 2012-03-31 DIAGNOSIS — J449 Chronic obstructive pulmonary disease, unspecified: Secondary | ICD-10-CM | POA: Diagnosis present

## 2012-03-31 DIAGNOSIS — I69928 Other speech and language deficits following unspecified cerebrovascular disease: Secondary | ICD-10-CM

## 2012-03-31 DIAGNOSIS — E876 Hypokalemia: Secondary | ICD-10-CM | POA: Diagnosis not present

## 2012-03-31 DIAGNOSIS — E785 Hyperlipidemia, unspecified: Secondary | ICD-10-CM | POA: Diagnosis present

## 2012-03-31 DIAGNOSIS — Z8673 Personal history of transient ischemic attack (TIA), and cerebral infarction without residual deficits: Secondary | ICD-10-CM

## 2012-03-31 DIAGNOSIS — I671 Cerebral aneurysm, nonruptured: Principal | ICD-10-CM | POA: Diagnosis present

## 2012-03-31 DIAGNOSIS — E119 Type 2 diabetes mellitus without complications: Secondary | ICD-10-CM | POA: Diagnosis present

## 2012-03-31 DIAGNOSIS — J4489 Other specified chronic obstructive pulmonary disease: Secondary | ICD-10-CM | POA: Diagnosis present

## 2012-03-31 LAB — POCT ACTIVATED CLOTTING TIME
Activated Clotting Time: 164 seconds
Activated Clotting Time: 179 seconds
Activated Clotting Time: 199 seconds

## 2012-03-31 LAB — GLUCOSE, CAPILLARY
Glucose-Capillary: 116 mg/dL — ABNORMAL HIGH (ref 70–99)
Glucose-Capillary: 113 mg/dL — ABNORMAL HIGH (ref 70–99)
Glucose-Capillary: 102 mg/dL — ABNORMAL HIGH (ref 70–99)

## 2012-03-31 LAB — HEPARIN LEVEL (UNFRACTIONATED): Heparin Unfractionated: <0.1 IU/mL — ABNORMAL LOW (ref 0.30–0.70)

## 2012-03-31 SURGERY — RADIOLOGY WITH ANESTHESIA
Anesthesia: General

## 2012-03-31 MED ORDER — NEOSTIGMINE METHYLSULFATE 1 MG/ML IJ SOLN
INTRAMUSCULAR | Status: DC | PRN
  Administered 2012-03-31: 5 mg via INTRAVENOUS

## 2012-03-31 MED ORDER — EPTIFIBATIDE 2 MG/ML IV SOLN
INTRAVENOUS | Status: AC
  Filled 2012-03-31: qty 10

## 2012-03-31 MED ORDER — NIMODIPINE 30 MG PO CAPS
30.0000 mg | ORAL_CAPSULE | Freq: Once | ORAL | Status: DC
  Filled 2012-03-31: qty 1

## 2012-03-31 MED ORDER — SODIUM CHLORIDE 0.9 % IJ SOLN
1.5000 mg | INTRAVENOUS | Status: DC
  Filled 2012-03-31: qty 0.3

## 2012-03-31 MED ORDER — GLYCOPYRROLATE 0.2 MG/ML IJ SOLN
INTRAMUSCULAR | Status: DC | PRN
  Administered 2012-03-31: 0.6 mg via INTRAVENOUS

## 2012-03-31 MED ORDER — NICARDIPINE HCL IN NACL 20-0.86 MG/200ML-% IV SOLN
5.0000 mg/h | INTRAVENOUS | Status: DC
  Filled 2012-03-31 (×2): qty 200

## 2012-03-31 MED ORDER — SODIUM CHLORIDE 0.9 % IV SOLN
INTRAVENOUS | Status: AC
  Administered 2012-03-31: 14:00:00 via INTRAVENOUS

## 2012-03-31 MED ORDER — EPTIFIBATIDE 2 MG/ML IV SOLN
2.0000 mg | Freq: Once | INTRAVENOUS | Status: AC
  Administered 2012-03-31: 2 mg

## 2012-03-31 MED ORDER — CLOPIDOGREL BISULFATE 75 MG PO TABS
75.0000 mg | ORAL_TABLET | Freq: Once | ORAL | Status: AC
  Administered 2012-03-31: 75 mg via ORAL

## 2012-03-31 MED ORDER — ACETAMINOPHEN 500 MG PO TABS: 1000.0000 mg | ORAL_TABLET | Freq: Four times a day (QID) | ORAL | Status: DC | PRN

## 2012-03-31 MED ORDER — EPTIFIBATIDE 2 MG/ML IV SOLN
6.0000 mg | Freq: Once | INTRAVENOUS | Status: AC
  Administered 2012-03-31: 6 mg via INTRAVENOUS

## 2012-03-31 MED ORDER — NICARDIPINE HCL IN NACL 20-0.86 MG/200ML-% IV SOLN
5.0000 mg/h | INTRAVENOUS | Status: AC
  Administered 2012-03-31: 5 mg/h via INTRAVENOUS
  Filled 2012-03-31: qty 200

## 2012-03-31 MED ORDER — PHENYLEPHRINE HCL 10 MG/ML IJ SOLN
10.0000 mg | INTRAVENOUS | Status: DC | PRN
  Administered 2012-03-31: 50 ug/min via INTRAVENOUS

## 2012-03-31 MED ORDER — LABETALOL HCL 5 MG/ML IV SOLN
INTRAVENOUS | Status: DC | PRN
  Administered 2012-03-31: 10 mg via INTRAVENOUS

## 2012-03-31 MED ORDER — NIMODIPINE 30 MG PO CAPS
60.0000 mg | ORAL_CAPSULE | Freq: Once | ORAL | Status: AC
  Administered 2012-03-31: 30 mg via ORAL
  Filled 2012-03-31: qty 2

## 2012-03-31 MED ORDER — ONDANSETRON HCL 4 MG/2ML IJ SOLN: 4.0000 mg | Freq: Four times a day (QID) | INTRAMUSCULAR | Status: DC | PRN

## 2012-03-31 MED ORDER — CLOPIDOGREL BISULFATE 75 MG PO TABS
75.0000 mg | ORAL_TABLET | Freq: Every day | ORAL | Status: DC
  Administered 2012-04-01: 75 mg via ORAL
  Filled 2012-03-31 (×2): qty 1

## 2012-03-31 MED ORDER — ASPIRIN 325 MG PO TABS
325.0000 mg | ORAL_TABLET | Freq: Every day | ORAL | Status: DC
  Administered 2012-04-01: 325 mg via ORAL
  Filled 2012-03-31 (×2): qty 1

## 2012-03-31 MED ORDER — ONDANSETRON HCL 4 MG/2ML IJ SOLN
INTRAMUSCULAR | Status: DC | PRN
  Administered 2012-03-31: 4 mg via INTRAVENOUS

## 2012-03-31 MED ORDER — NIMODIPINE 30 MG PO CAPS
60.0000 mg | ORAL_CAPSULE | Freq: Once | ORAL | Status: DC
  Filled 2012-03-31: qty 2

## 2012-03-31 MED ORDER — VANCOMYCIN HCL 1000 MG IV SOLR
1000.0000 mg | Freq: Once | INTRAVENOUS | Status: DC
  Filled 2012-03-31: qty 1000

## 2012-03-31 MED ORDER — INSULIN ASPART 100 UNIT/ML ~~LOC~~ SOLN
0.0000 [IU] | Freq: Every day | SUBCUTANEOUS | Status: DC
  Filled 2012-03-31: qty 3

## 2012-03-31 MED ORDER — NITROGLYCERIN IN D5W 200-5 MCG/ML-% IV SOLN
INTRAVENOUS | Status: DC | PRN
  Administered 2012-03-31: 20 ug/kg/min via INTRAVENOUS

## 2012-03-31 MED ORDER — ROCURONIUM BROMIDE 100 MG/10ML IV SOLN
INTRAVENOUS | Status: DC | PRN
  Administered 2012-03-31: 20 mg via INTRAVENOUS
  Administered 2012-03-31: 50 mg via INTRAVENOUS
  Administered 2012-03-31 (×2): 10 mg via INTRAVENOUS

## 2012-03-31 MED ORDER — IOHEXOL 300 MG/ML  SOLN: 150.0000 mL | Freq: Once | INTRAMUSCULAR | Status: AC | PRN

## 2012-03-31 MED ORDER — HEPARIN (PORCINE) IN NACL 100-0.45 UNIT/ML-% IJ SOLN
800.0000 [IU]/h | INTRAMUSCULAR | Status: AC
  Administered 2012-03-31: 600 [IU]/h via INTRAVENOUS
  Filled 2012-03-31: qty 250

## 2012-03-31 MED ORDER — LACTATED RINGERS IV SOLN
INTRAVENOUS | Status: DC | PRN
  Administered 2012-03-31 (×2): via INTRAVENOUS

## 2012-03-31 MED ORDER — INSULIN ASPART 100 UNIT/ML ~~LOC~~ SOLN
0.0000 [IU] | Freq: Three times a day (TID) | SUBCUTANEOUS | Status: DC
  Filled 2012-03-31: qty 3

## 2012-03-31 MED ORDER — SODIUM CHLORIDE 0.9 % IV SOLN: Freq: Once | INTRAVENOUS | Status: DC

## 2012-03-31 MED ORDER — FENTANYL CITRATE 0.05 MG/ML IJ SOLN
INTRAMUSCULAR | Status: DC | PRN
  Administered 2012-03-31: 150 ug via INTRAVENOUS
  Administered 2012-03-31: 50 ug via INTRAVENOUS

## 2012-03-31 MED ORDER — PROPOFOL 10 MG/ML IV EMUL
INTRAVENOUS | Status: DC | PRN
  Administered 2012-03-31: 200 mg via INTRAVENOUS

## 2012-03-31 MED ORDER — LIDOCAINE HCL (CARDIAC) 20 MG/ML IV SOLN
INTRAVENOUS | Status: DC | PRN
  Administered 2012-03-31: 80 mg via INTRAVENOUS

## 2012-03-31 MED ORDER — HEPARIN SODIUM (PORCINE) 1000 UNIT/ML IJ SOLN
INTRAMUSCULAR | Status: DC | PRN
  Administered 2012-03-31: 500 [IU] via INTRAVENOUS
  Administered 2012-03-31: 3000 [IU] via INTRAVENOUS
  Administered 2012-03-31: 500 [IU] via INTRAVENOUS
  Administered 2012-03-31: 1000 [IU] via INTRAVENOUS

## 2012-03-31 MED ORDER — HEPARIN (PORCINE) IN NACL 100-0.45 UNIT/ML-% IJ SOLN
500.0000 [IU]/h | INTRAMUSCULAR | Status: DC
  Administered 2012-03-31: 500 [IU]/h via INTRAVENOUS
  Filled 2012-03-31: qty 250

## 2012-03-31 MED ORDER — VANCOMYCIN HCL IN DEXTROSE 1-5 GM/200ML-% IV SOLN
INTRAVENOUS | Status: AC
  Administered 2012-03-31: 1000 mg via INTRAVENOUS
  Filled 2012-03-31: qty 200

## 2012-03-31 MED ORDER — ACETAMINOPHEN 650 MG RE SUPP: 650.0000 mg | Freq: Four times a day (QID) | RECTAL | Status: DC | PRN

## 2012-03-31 NOTE — Anesthesia Procedure Notes (Signed)
Procedure Name: Intubation Date/Time: 03/31/2012 9:00 AM Performed by: Arlice Colt B Pre-anesthesia Checklist: Patient identified, Emergency Drugs available, Suction available, Patient being monitored and Timeout performed Patient Re-evaluated:Patient Re-evaluated prior to inductionOxygen Delivery Method: Circle system utilized Preoxygenation: Pre-oxygenation with 100% oxygen Intubation Type: IV induction Ventilation: Mask ventilation without difficulty and Oral airway inserted - appropriate to patient size Laryngoscope Size: Mac and 3 Grade View: Grade III Tube type: Oral Tube size: 7.5 mm Number of attempts: 1 Airway Equipment and Method: Bougie stylet Placement Confirmation: ETT inserted through vocal cords under direct vision,  positive ETCO2 and breath sounds checked- equal and bilateral Secured at: 22 cm Tube secured with: Tape Dental Injury: Teeth and Oropharynx as per pre-operative assessment

## 2012-03-31 NOTE — Progress Notes (Signed)
Pt.s NTG drip stopped and cardene began per Dr. Reginia Forts orders.

## 2012-03-31 NOTE — Progress Notes (Signed)
ANTICOAGULATION CONSULT NOTE - Follow Up Consult  Pharmacy Consult for Heparin Indication: S/p coiling of intracranial aneurysm  Allergies  Allergen Reactions  . Banana Anaphylaxis  . Benicar (Olmesartan Medoxomil)   . Codeine   . Diovan (Valsartan)   . Latex     REACTION: red rash  . Penicillins   . Spinach     Stomach problems  . Sulfonamide Derivatives   . Ace Inhibitors Cough    Patient Measurements: Height: 5\' 2"  (157.5 cm) Weight: 195 lb 8.8 oz (88.7 kg) IBW/kg (Calculated) : 50.1  Heparin Dosing Weight: 70 kg  Vital Signs: Temp: 97.8 F (36.6 C) (07/24 1938) Temp src: Oral (07/24 1938) BP: 99/44 mmHg (07/24 1400) Pulse Rate: 63  (07/24 2000)  Labs:  Alvira Philips 03/31/12 2109  HGB --  HCT --  PLT --  APTT --  LABPROT --  INR --  HEPARINUNFRC <0.10*  CREATININE --  CKTOTAL --  CKMB --  TROPONINI --    Estimated Creatinine Clearance: 59 ml/min (by C-G formula based on Cr of 0.93).   Medications:  Infusions:    . sodium chloride 75 mL/hr at 03/31/12 2000  . heparin 600 Units/hr (03/31/12 2000)  . niCARDipine Stopped (03/31/12 1700)  . DISCONTD: heparin 500 Units/hr (03/31/12 1258)    Assessment: 71 y.o. F on heparin s/p coiling of intracranial aneurysm in IR today. The patient's evening heparin level was SUBtherapeutic at <0.1 (goal of 0.1-0.25). Will increase level slightly and check again with the a.m labs. CBC ok, no s/sx of bleeding noted.   Goal of Therapy:  Heparin level 0.1-0.25 units/ml Monitor platelets by anticoagulation protocol: Yes   Plan:  1. Increase heparin to 700 units/hr (~10 units/kg/hr) 2. Will continue to monitor for any signs/symptoms of bleeding and will follow up with heparin level in 6 hours with a.m labs  Georgina Pillion, PharmD, BCPS Clinical Pharmacist Pager: [phone] 03/31/2012 10:10 PM

## 2012-03-31 NOTE — H&P (Signed)
Chief complaint: The patient recently had a stroke on 02/27/2012  and underwent studies which revealed an aneurysm as well.   History of present illness:  The patient is a 71 year old Caucasian female who developed an  acute onset of slurred speech, dizziness affecting gait on  02/27/2012. 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 MRI/MRA of the brain revealed an acute non-  hemorrhagic left paracentral pontine infarct, prominent  intracranial atherosclerotic type changes, remote small thalamic  infarcts, remote infarcts corona radiata bilaterally and global  atrophy. The patient was placed on aspirin and Plavix and most of  her symptoms resolved with the exception of slurred speech. The  patient does describe an intermittent dizziness. The patient  denies any focal weakness, loss of consciousness or seizure  activity. She denies any new or significant change in her symptoms  since her discharge from the hospital. The patient confirms she  has been compliant with all her medications since her discharge;  specifically her ASA and plavix.  She was seen in consultation by Dr. Corliss Skains and scheduled for cerebral angiogram with intended intervention of coiling and possible stenting. She denies ant new c/o or sxs since she was seen in clinic.  Past medical history:  1. Hypothyroidism  2. Diabetes  3. Hyperlipidemia  4. Hypertension  5. COPD; asthma  7. Gastroesophageal reflux disease  8. Esophageal stricture  9. Diverticulosis  10. History of hiatal hernia  11. Obesity  12. CVA with slurred speech, facial droop and gait instability.   Social history:  The patient is married and lives with her husband. The patient  recently quit smoking after her recent CVA event. She is a retired  Char[employer] fundraiser. Denies alcohol or recreational drug use.  No family history of  aneurysms per her knowledge.   Allergies:  1. Bananas  2. Benicar  3. Codeine  4. Diovan  5. Latex  6. Penicillin  7. Spanish  8. Sulfa derivatives  9. ACE inhibitors   Medications:  1. Xanax 0.25 mg daily  2. Norvasc 10 mg daily  3. Aspirin 81 mg daily  4. Plavix 75 mg daily  5. Lexapro 10 mg daily  6. HCTZ 12.5 mg daily  7. Prilosec 20 mg daily  8. Crestor 20 mg daily  9. Ditropan X L 15 mg daily  10. Reglan 10 mg daily  12. Skelaxin and 800 mg tablets prn spasms  13. Benadryl 25 mg q.h.s. as needed   Review of systems:  Positive for blurred vision, slurred speech, right facial droop,  headaches, gait instability, dizziness, reflux, shortness of  breath, occasional muscle spasms, anxiety, and urinary  incontinence, intermittent diarrhea, and occasional insomnia.  Negative for recent fever, chills, significant weight loss, chest  pain, heart palpitations, lower extremity edema, abdominal pain,  loss of consciousness, seizures, bleeding disorders, paresthesias,  extremity weakness or falls.   Physical examination:  A 72 year old Caucasian female who appears older than her stated age. ENT: unremarkable Lungs: CTA Heart: Reg Abd: soft, NT Ext: Excellent femoral pulses bilat, feet warm pedal pulses diminished but present. Neuro:The patient is able to ambulate with assistance with a slow  steady gait. Mild right facial droop is noted with slurred speech.  Her mentation is intact. Cognition is intact. She has full range  of motion of all four extremities with 5/5 strength. No lower  extremity edema is noted. E O M I. Tongue is midline.   Imaging:  MRI and MRA examination as well as cerebral arteriogram images were  reviewed with the patient and her spouse at today's setting.  Treatment of intracranial aneurysms with the use of stenting and  coiling under general anesthesia was discussed with the patient in  her spouse in detail. The benefits of  the procedure would be that  of prevention of rupture. Potential complications discussed in  detail but not limited to infection, bleeding, vessel damage,  intracranial hemorrhage, complications with general anesthesia,  stroke and contrast media reaction. The negative effects of  nicotine on endovascular coiling was discussed in detail with the  patient who confirmed she is no longer smoking. The patient and  spouse's questions were answered to their satisfaction. The  patient is made aware that she would need to remain on her aspirin  and Plavix until the date of the procedure if elected to undergo.  If stenting is used, she may need to remain on this medication for  a minimum of 4 weeks. She will made remain on her aspirin therapy  indefinitely at this time. A prescription for Protonix 40 mg to be  taken daily was substituted for her Prilosec given the of potential  negative effects on of the prilosec with Plavix.   Labs: Results for orders placed during the hospital encounter of 03/31/12  GLUCOSE, CAPILLARY      Component Value Range   Glucose-Capillary 116 (*) 70 - 99 mg/dL   CBC    Component Value Date/Time   WBC 8.1 03/24/2012 1112   RBC 4.78 03/24/2012 1112   HGB 13.9 03/24/2012 1112   HCT 40.1 03/24/2012 1112   PLT 264 03/24/2012 1112   MCV 83.9 03/24/2012 1112   MCH 29.1 03/24/2012 1112   MCHC 34.7 03/24/2012 1112   RDW 13.4 03/24/2012 1112   LYMPHSABS 2.1 03/24/2012 1112   MONOABS 0.5 03/24/2012 1112   EOSABS 0.2 03/24/2012 1112   BASOSABS 0.0 03/24/2012 1112   BMET    Component Value Date/Time   NA 138 03/24/2012 1112   K 4.1 03/24/2012 1112   CL 96 03/24/2012 1112   CO2 32 03/24/2012 1112   GLUCOSE 133* 03/24/2012 1112   BUN 13 03/24/2012 1112   CREATININE 0.93 03/24/2012 1112   CALCIUM 10.4 03/24/2012 1112   GFRNONAA 61* 03/24/2012 1112   GFRAA 71* 03/24/2012 1112    PT/INR: 13.4/1.0  Impression/plan:  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  Procedure reviewed with pt and family. Plan for general anesthesia. Labs ok. Pt given Plavix in short stay as she did not take at home. Consent signed in chart.  Brayton El PA-C 03/31/2012 8:19 AM

## 2012-03-31 NOTE — Anesthesia Postprocedure Evaluation (Signed)
Anesthesia Post Note  Patient: Elizabeth [name]  Procedure(s) Performed: Procedure(s) (LRB): RADIOLOGY WITH ANESTHESIA (N/A)  Anesthesia type: General  Patient location: PACU  Post pain: Pain level controlled and Adequate analgesia  Post assessment: Post-op Vital signs reviewed, Patient's Cardiovascular Status Stable, Respiratory Function Stable, Patent Airway and Pain level controlled  Last Vitals:  Filed Vitals:   03/31/12 1230  BP:   Pulse:   Temp: 36.1 C  Resp:     Post vital signs: Reviewed and stable  Level of consciousness: awake, alert  and oriented  Complications: No apparent anesthesia complications

## 2012-03-31 NOTE — Consult Note (Signed)
ANTICOAGULATION CONSULT NOTE - Initial Consult  Pharmacy Consult for Heparin Indication: intracranial aneurysm s/p coiling  Allergies  Allergen Reactions  . Banana Anaphylaxis  . Benicar (Olmesartan Medoxomil)   . Codeine   . Diovan (Valsartan)   . Latex     REACTION: red rash  . Penicillins   . Spinach     Stomach problems  . Sulfonamide Derivatives   . Ace Inhibitors Cough    Patient Measurements: Height: 5\' 2"  (157.5 cm) Weight: 188 lb 4.4 oz (85.4 kg) IBW/kg (Calculated) : 50.1  Heparin dosing weight: 69.4kg  Vital Signs: Temp: 97 F (36.1 C) (07/24 1230) Temp src: Oral (07/24 0714) BP: 151/82 mmHg (07/24 0714) Pulse Rate: 75  (07/24 1230)  Labs: No results found for this basename: HGB:2,HCT:3,PLT:3,APTT:3,LABPROT:3,INR:3,HEPARINUNFRC:3,CREATININE:3,CKTOTAL:3,CKMB:3,TROPONINI:3 in the last 72 hours  Estimated Creatinine Clearance: 57.9 ml/min (by C-G formula based on Cr of 0.93).   Medical History: Past Medical History  Diagnosis Date  . Diarrhea   . Obesity, unspecified   . Other and unspecified hyperlipidemia   . Unspecified essential hypertension   . Family history of malignant neoplasm of gastrointestinal tract   . Esophageal reflux   . Stricture and stenosis of esophagus   . Diverticulosis of colon (without mention of hemorrhage)   . Personal history of colonic polyps   . Active smoker   . Unspecified asthma   . Type II or unspecified type diabetes mellitus without mention of complication, not stated as uncontrolled   . Chronic airway obstruction, not elsewhere classified   . Headache     Irregular  . Anxiety     Takes Xanax for anxiety   Medications:  Heparin @ 500 units/hr started in IR  Assessment: 71yof s/p coiling of ACom aneurysm started on heparin in IR.  Dose is ~ 7 units/kg/hr using the heparin dosing weight.  Will increase rate a little bit to the recommended 8-10 units/kg/hr and check a heparin level this evening.  Goal of Therapy:   Heparin level 0.1-0.25 Monitor platelets by anticoagulation protocol: Yes   Plan:  1) Increase heparin to 600 units/hr (~9 units/kg/hr) 2) 6 hour heparin level  Fredrik Rigger 03/31/2012,1:00 PM

## 2012-03-31 NOTE — Progress Notes (Signed)
Day of Surgery  Subjective: Anterior communicating artery aneurysm coiling performed in IR today with Dr Corliss Skains Pt has tolerated procedure well  Objective: Vital signs in last 24 hours: Temp:  [97 F (36.1 C)-98.6 F (37 C)] 98.6 F (37 C) (07/24 1326) Pulse Rate:  [57-81] 63  (07/24 1515) Resp:  [12-20] 13  (07/24 1515) BP: (99-151)/(44-82) 99/44 mmHg (07/24 1400) SpO2:  [92 %-100 %] 94 % (07/24 1515) Arterial Line BP: (123-133)/(55-64) 131/59 mmHg (07/24 1515) Weight:  [188 lb 4.4 oz (85.4 kg)-195 lb 8.8 oz (88.7 kg)] 195 lb 8.8 oz (88.7 kg) (07/24 1400)    Intake/Output from previous day:   Intake/Output this shift: Total I/O In: 1570.9 [I.V.:1570.9] Out: 1200 [Urine:1200]  PE: VSS; afeb Neuro intact face symmetrical; tongue midline Smile = EOMI; appropriate Good strength and good movement all 4s Rt groin NT no bleeding; no hematoma 2 + pulses rt foot  Lab Results:  No results found for this basename: WBC:2,HGB:2,HCT:2,PLT:2 in the last 72 hours BMET No results found for this basename: NA:2,K:2,CL:2,CO2:2,GLUCOSE:2,BUN:2,CREATININE:2,CALCIUM:2 in the last 72 hours PT/INR No results found for this basename: LABPROT:2,INR:2 in the last 72 hours ABG No results found for this basename: PHART:2,PCO2:2,PO2:2,HCO3:2 in the last 72 hours  Studies/Results: No results found.  Anti-infectives: Anti-infectives     Start     Dose/Rate Route Frequency Ordered Stop   03/31/12 0656   vancomycin (VANCOCIN) 1 GM/200ML IVPB     Comments: TODD, ROBERT: cabinet override         03/31/12 0656 03/31/12 0904   03/31/12 0630   vancomycin (VANCOCIN) 1,000 mg in sodium chloride 0.9 % 250 mL IVPB  Status:  Discontinued        1,000 mg 250 mL/hr over 60 Minutes Intravenous  Once 03/31/12 0626 03/31/12 1336          Assessment/Plan: s/p Procedure(s) (LRB): RADIOLOGY WITH ANESTHESIA (N/A)   Anterior communicating artery aneurysm coiling performed in IR today Pt tolerated  well; resting Pt has been seen and examined by Dr Corliss Skains Increase diet slowly Will plan for dc in am if stable    TURPIN,PAMELA A 03/31/2012

## 2012-03-31 NOTE — Procedures (Signed)
S/P endovascular coiling of complex ACom aneurysm. RT CFA approach

## 2012-03-31 NOTE — Progress Notes (Signed)
Pt. Alert and oriened, moving all 4 ext.s, 2 t pedal pulses, rt groin aline intact with dressing cdi,

## 2012-03-31 NOTE — Anesthesia Preprocedure Evaluation (Addendum)
Anesthesia Evaluation  Patient identified by MRN, date of birth, ID band Patient awake    Reviewed: Allergy & Precautions, H&P , NPO status , Patient's Chart, lab work & pertinent test results  History of Anesthesia Complications Negative for: history of anesthetic complications  Airway Mallampati: III      Dental  (+) Poor Dentition   Pulmonary shortness of breath, asthma , COPD   Pulmonary exam normal       Cardiovascular hypertension, Rhythm:Regular Rate:Normal     Neuro/Psych  Headaches, Anxiety  Neuromuscular disease CVA    GI/Hepatic GERD-  ,  Endo/Other  Hyperthyroidism   Renal/GU      Musculoskeletal   Abdominal   Peds  Hematology   Anesthesia Other Findings   Reproductive/Obstetrics                          Anesthesia Physical Anesthesia Plan  ASA: III  Anesthesia Plan: General   Post-op Pain Management:    Induction: Intravenous  Airway Management Planned: Oral ETT  Additional Equipment: Arterial line  Intra-op Plan:   Post-operative Plan: Extubation in OR  Informed Consent: I have reviewed the patients History and Physical, chart, labs and discussed the procedure including the risks, benefits and alternatives for the proposed anesthesia with the patient or authorized representative who has indicated his/her understanding and acceptance.   Dental advisory given  Plan Discussed with: Anesthesiologist, Surgeon and CRNA  Anesthesia Plan Comments:        Anesthesia Quick Evaluation

## 2012-03-31 NOTE — Transfer of Care (Signed)
Immediate Anesthesia Transfer of Care Note  Patient: Elizabeth [name]  Procedure(s) Performed: Procedure(s) (LRB): RADIOLOGY WITH ANESTHESIA (N/A)  Patient Location: PACU  Anesthesia Type: General  Level of Consciousness: awake, alert , oriented and patient cooperative  Airway & Oxygen Therapy: Patient Spontanous Breathing and Patient connected to nasal cannula oxygen  Post-op Assessment: Report given to PACU RN, Post -op Vital signs reviewed and stable and Patient moving all extremities  Post vital signs: Reviewed and stable  Complications: No apparent anesthesia complications

## 2012-04-01 LAB — CBC WITH DIFFERENTIAL/PLATELET
Basophils Absolute: 0 10*3/uL (ref 0.0–0.1)
Basophils Relative: 1 % (ref 0–1)
Eosinophils Absolute: 0.1 10*3/uL (ref 0.0–0.7)
Eosinophils Relative: 1 % (ref 0–5)
HCT: 35.8 % — ABNORMAL LOW (ref 36.0–46.0)
Hemoglobin: 12.2 g/dL (ref 12.0–15.0)
Lymphocytes Relative: 26 % (ref 12–46)
Lymphs Abs: 1.7 10*3/uL (ref 0.7–4.0)
MCH: 28.6 pg (ref 26.0–34.0)
MCHC: 34.1 g/dL (ref 30.0–36.0)
MCV: 84 fL (ref 78.0–100.0)
Monocytes Absolute: 0.5 10*3/uL (ref 0.1–1.0)
Monocytes Relative: 7 % (ref 3–12)
Neutro Abs: 4.3 10*3/uL (ref 1.7–7.7)
Neutrophils Relative %: 65 % (ref 43–77)
Platelets: 243 10*3/uL (ref 150–400)
RBC: 4.26 MIL/uL (ref 3.87–5.11)
RDW: 13.7 % (ref 11.5–15.5)
WBC: 6.5 10*3/uL (ref 4.0–10.5)

## 2012-04-01 LAB — BASIC METABOLIC PANEL
BUN: 8 mg/dL (ref 6–23)
CO2: 27 mEq/L (ref 19–32)
Calcium: 8.9 mg/dL (ref 8.4–10.5)
Chloride: 103 mEq/L (ref 96–112)
Creatinine, Ser: 0.66 mg/dL (ref 0.50–1.10)
GFR calc Af Amer: >90 mL/min (ref >90)
GFR calc non Af Amer: 88 mL/min — ABNORMAL LOW (ref >90)
Glucose, Bld: 119 mg/dL — ABNORMAL HIGH (ref 70–99)
Potassium: 3.2 mEq/L — ABNORMAL LOW (ref 3.5–5.1)
Sodium: 140 mEq/L (ref 135–145)

## 2012-04-01 LAB — OSMOLALITY, URINE: Osmolality, Ur: 191 mOsm/kg — ABNORMAL LOW (ref 390–1090)

## 2012-04-01 LAB — GLUCOSE, CAPILLARY
Glucose-Capillary: 109 mg/dL — ABNORMAL HIGH (ref 70–99)
Glucose-Capillary: 104 mg/dL — ABNORMAL HIGH (ref 70–99)

## 2012-04-01 LAB — HEPARIN LEVEL (UNFRACTIONATED): Heparin Unfractionated: <0.1 IU/mL — ABNORMAL LOW (ref 0.30–0.70)

## 2012-04-01 MED ORDER — AMLODIPINE BESYLATE 5 MG PO TABS
5.0000 mg | ORAL_TABLET | Freq: Every day | ORAL | Status: DC
  Administered 2012-04-01: 5 mg via ORAL
  Filled 2012-04-01: qty 1

## 2012-04-01 MED ORDER — METOCLOPRAMIDE HCL 10 MG PO TABS
10.0000 mg | ORAL_TABLET | Freq: Four times a day (QID) | ORAL | Status: DC
  Administered 2012-04-01: 10 mg via ORAL
  Filled 2012-04-01 (×4): qty 1

## 2012-04-01 MED ORDER — OXYBUTYNIN CHLORIDE ER 15 MG PO TB24
15.0000 mg | ORAL_TABLET | Freq: Every day | ORAL | Status: DC
  Administered 2012-04-01: 15 mg via ORAL
  Filled 2012-04-01: qty 1

## 2012-04-01 MED ORDER — POTASSIUM CHLORIDE CRYS ER 20 MEQ PO TBCR
40.0000 meq | EXTENDED_RELEASE_TABLET | Freq: Once | ORAL | Status: AC
  Administered 2012-04-01: 40 meq via ORAL
  Filled 2012-04-01: qty 2

## 2012-04-01 MED ORDER — HYDROCHLOROTHIAZIDE 12.5 MG PO CAPS
12.5000 mg | ORAL_CAPSULE | Freq: Every day | ORAL | Status: DC
  Administered 2012-04-01: 12.5 mg via ORAL
  Filled 2012-04-01: qty 1

## 2012-04-01 MED ORDER — ATORVASTATIN CALCIUM 40 MG PO TABS
40.0000 mg | ORAL_TABLET | Freq: Every day | ORAL | Status: DC
  Filled 2012-04-01: qty 1

## 2012-04-01 MED ORDER — ESCITALOPRAM OXALATE 20 MG PO TABS
20.0000 mg | ORAL_TABLET | Freq: Every day | ORAL | Status: DC
  Administered 2012-04-01: 20 mg via ORAL
  Filled 2012-04-01: qty 1

## 2012-04-01 MED ORDER — PANTOPRAZOLE SODIUM 20 MG PO TBEC
20.0000 mg | DELAYED_RELEASE_TABLET | Freq: Every day | ORAL | Status: DC
  Administered 2012-04-01: 20 mg via ORAL
  Filled 2012-04-01: qty 1

## 2012-04-01 NOTE — Progress Notes (Signed)
ANTICOAGULATION CONSULT NOTE - Follow Up Consult  Pharmacy Consult for Heparin Indication: S/p coiling of intracranial aneurysm  Allergies  Allergen Reactions  . Banana Anaphylaxis  . Benicar (Olmesartan Medoxomil)   . Codeine   . Diovan (Valsartan)   . Latex     REACTION: red rash  . Penicillins   . Spinach     Stomach problems  . Sulfonamide Derivatives   . Ace Inhibitors Cough    Patient Measurements: Height: 5\' 2"  (157.5 cm) Weight: 195 lb 8.8 oz (88.7 kg) IBW/kg (Calculated) : 50.1  Heparin Dosing Weight: 70 kg  Vital Signs: Temp: 97.8 F (36.6 C) (07/24 1938) Temp src: Oral (07/24 1938) Pulse Rate: 69  (07/25 0500)  Labs:  Basename 04/01/12 0405 03/31/12 2109  HGB 12.2 --  HCT 35.8* --  PLT 243 --  APTT -- --  LABPROT -- --  INR -- --  HEPARINUNFRC <0.10* <0.10*  CREATININE 0.66 --  CKTOTAL -- --  CKMB -- --  TROPONINI -- --    Estimated Creatinine Clearance: 68.6 ml/min (by C-G formula based on Cr of 0.66).   Medications:  Infusions:     . sodium chloride 68 mL/hr at 04/01/12 0200  . heparin 700 Units/hr (04/01/12 0500)  . niCARDipine Stopped (04/01/12 0513)  . DISCONTD: heparin 500 Units/hr (03/31/12 1258)    Assessment: 71 y.o. F on heparin s/p coiling of intracranial aneurysm in IR yesterday. Heparin level is below-goal on 700 units/hr.   Goal of Therapy:  Heparin level 0.1-0.25 units/ml Monitor platelets by anticoagulation protocol: Yes   Plan:  1. Increase heparin to 800 units/hr.  2. Discontinue heparin at 07:00 AM per physician order.   Lorre Munroe, PharmD 04/01/2012 5:57 AM

## 2012-04-01 NOTE — Plan of Care (Signed)
Problem: Phase II Progression Outcomes Goal: Ambulates up to 600 ft. in hall x 1 Outcome: Completed/Met Date Met:  04/01/12 Patient ambulated in room and once around the unit.

## 2012-04-01 NOTE — Discharge Summary (Signed)
Physician Discharge Summary  Patient ID: Elizabeth [name] MRN: [id number] DOB/AGE: [birth year] 71 y.o.  Admit date: 03/31/2012 Discharge date: 04/01/2012  Admission Diagnoses:  1.  Complex ACOM aneurysm  2. HTN 3. DM 4. COPD 5. Hyperlipidemia 6. GERD 7. Hx of CVA with residual slurred speech 8. Gait instability 9. Intermittent dizziness  Discharge Diagnoses: Complex ACOM aneurysm s/p endovascular coiling.   Discharged Condition: stable  Hospital Course: Patient had undergone initial cerebral angiogram procedure on 02/29/2012  which revealed a 6.5 x 5.2 ACOM aneurysm with an associated daughter aneurysm.  She presented for formal consultation to discuss coiling procedure and decided to proceed.  Patient underwent cerebral angiogram with endovascular coiling under general anesthesia. She was extubated without difficulty and heparin started overnight. She had no bleeding issues throughout her admission and was placed on SSI for her diabetic coverage.  She remained hemodynamically and neurologically stable throughout her admission. Groin sheath removed without complication next morning and patient was able to ambulate with assistance with stable groin after 6 hours of bedrest.  Foley removed and patient able to void without difficulty. AM labs reviewed revealed mild hypolkalemia which was addressed. She is to be discharged today with limited activity x 2 weeks until her scheduled follow up appointment.  Plavix and ASA will continue as an OP. Janumet will be held for 48 hours post contrast administration.   Consults: None   Significant Diagnostic Studies: Results for LAURAANN, MISSEY (MRN [id number]) as of 04/01/2012 12:40  Ref. Range 04/01/2012 04:05  Sodium Latest Range: 135-145 mEq/L 140  Potassium Latest Range: 3.5-5.1 mEq/L 3.2 (L)  Chloride Latest Range: 96-112 mEq/L 103  CO2 Latest Range: 19-32 mEq/L 27  BUN Latest Range: 6-23 mg/dL 8  Creat Latest Range: 0.50-1.10 mg/dL 7.82  Calcium Latest  Range: 8.4-10.5 mg/dL 8.9  GFR calc non Af Amer Latest Range: >90 mL/min 88 (L)  GFR calc Af Amer Latest Range: >90 mL/min >90  Glucose Latest Range: 70-99 mg/dL 956 (H)  WBC Latest Range: 4.0-10.5 K/uL 6.5  RBC Latest Range: 3.87-5.11 MIL/uL 4.26  Hemoglobin Latest Range: 12.0-15.0 g/dL 21.3  HCT Latest Range: 36.0-46.0 % 35.8 (L)  MCV Latest Range: 78.0-100.0 fL 84.0  MCH Latest Range: 26.0-34.0 pg 28.6  MCHC Latest Range: 30.0-36.0 g/dL 08.6  RDW Latest Range: 11.5-15.5 % 13.7  Platelets Latest Range: 150-400 K/uL 243  Neutrophils Relative Latest Range: 43-77 % 65  Lymphocytes Relative Latest Range: 12-46 % 26  Monocytes Relative Latest Range: 3-12 % 7  Eosinophils Relative Latest Range: 0-5 % 1  Basophils Relative Latest Range: 0-1 % 1  NEUT# Latest Range: 1.7-7.7 K/uL 4.3  Lymphocytes Absolute Latest Range: 0.7-4.0 K/uL 1.7  Monocytes Absolute Latest Range: 0.1-1.0 K/uL 0.5  Eosinophils Absolute Latest Range: 0.0-0.7 K/uL 0.1  Basophils Absolute Latest Range: 0.0-0.1 K/uL 0.0  Heparin Unfractionated Latest Range: 0.30-0.70 IU/mL <0.10 (L)    Treatments:  Oneal Grout, MD Physician Signed Radiology Procedures 03/31/2012 11:53 AM    Pre-procedure Dx: Brain aneurysm [437.3]    Post-procedure Dx: Brain aneurysm [437.3]    Procedures: CEREBRAL ANGIOGRAM [VHQ4696 (Type: Custom)]   S/P endovascular coiling of complex ACom aneurysm.  RT CFA approach       Discharge Exam: Blood pressure 130/77, pulse 65, temperature 98.3 F (36.8 C), temperature source Oral, resp. rate 16, height 5\' 2"  (1.575 m), weight 195 lb 8.8 oz (88.7 kg), SpO2 96.00%.  Patient alert and oriented x 3.  Tolerating a regular diet.  She  has remained hemodynamically and neurologically stable throughout admission. PERRL, EOMI, clear speech. Symmetrical facies.   Right groin with pressure bandage in place, no hematoma, distal pulses intact bilaterally without edema.  5/5 strength of all 4  extremities.  Patient was able to ambulate throughout the hallway without difficulty. Groin site and vitals remained stable. She is to be discharged with limited activity until her follow up in 2 weeks on 04/14/12.  Disposition: 01-Home or Self Care  Discharge Orders    Future Appointments: Provider: Department: Dept Phone: Center:   04/14/2012 1:00 PM [facility]     Medication List  As of 04/01/2012  2:22 PM   STOP taking these medications         pantoprazole 20 MG tablet         TAKE these medications         ALPRAZolam 0.25 MG tablet   Commonly known as: XANAX   Take 0.25 mg by mouth 2 (two) times daily as needed.      amLODipine 10 MG tablet   Commonly known as: NORVASC   Take 5 mg by mouth daily.      aspirin 81 MG tablet   Take 1 tablet (81 mg total) by mouth daily.      clopidogrel 75 MG tablet   Commonly known as: PLAVIX   Take 1 tablet (75 mg total) by mouth daily with breakfast.      diphenhydrAMINE 25 MG tablet   Commonly known as: BENADRYL   Take 25 mg by mouth at bedtime as needed.      ergocalciferol 50000 UNITS capsule   Commonly known as: VITAMIN D2   Take 50,000 Units by mouth once a week.      escitalopram 10 MG tablet   Commonly known as: LEXAPRO   Take 20 mg by mouth daily.      hydrochlorothiazide 12.5 MG capsule   Commonly known as: MICROZIDE   Take 1 capsule (12.5 mg total) by mouth daily.      metaxalone 800 MG tablet   Commonly known as: SKELAXIN   Take 800 mg by mouth 2 (two) times daily.      metoCLOPramide 10 MG tablet   Commonly known as: REGLAN   Take 10 mg by mouth 4 (four) times daily.      omeprazole 20 MG capsule   Commonly known as: PRILOSEC   Take 20 mg by mouth daily.      oxybutynin 15 MG 24 hr tablet   Commonly known as: DITROPAN XL   Take 15 mg by mouth daily.      rosuvastatin 20 MG tablet   Commonly known as: CRESTOR   Take 20 mg by mouth daily.      sitaGLIPtan-metformin 50-1000 MG per tablet    Commonly known as: JANUMET   Take 1 tablet by mouth daily.          Patient informed not to resume Janumet medication until 04/03/12 due to contrast administration.    Follow-up Information    Follow up with Oneal Grout, MD on 04/14/2012. (arrive to radiology at [facility] at 1 pm)    Contact information:   radiology department at [facility] [phone] [facility])          Signed: Michael Litter D 04/01/2012, 2:22 PM

## 2012-04-01 NOTE — Progress Notes (Signed)
eLink Physician-Brief Progress Note Patient Name: Elizabeth [name] DOB: 31-Oct-1940 MRN: [id number]  Date of Service  04/01/2012   HPI/Events of Note  HYpokalemia  eICU Interventions  Potassium replaced   Intervention Category Intermediate Interventions: Electrolyte abnormality - evaluation and management  DETERDING,ELIZABETH 04/01/2012, 4:47 AM

## 2012-04-01 NOTE — Progress Notes (Signed)
1 Day Post-Op  Subjective: Awake, alert, finishing breakfast without difficulty.  Minimal headache- not requiring meds   Objective: Vital signs in last 24 hours: Temp:  [96.6 F (35.9 C)-98.6 F (37 C)] 97.8 F (36.6 C) (07/24 1938) Pulse Rate:  [57-81] 70  (07/25 0800) Resp:  [11-20] 17  (07/25 0800) BP: (99-139)/(44-74) 99/44 mmHg (07/24 1400) SpO2:  [92 %-100 %] 97 % (07/25 0800) Arterial Line BP: (116-157)/(50-64) 144/57 mmHg (07/25 0800) Weight:  [188 lb 4.4 oz (85.4 kg)-195 lb 8.8 oz (88.7 kg)] 195 lb 8.8 oz (88.7 kg) (07/24 1400) Last BM Date:  (PTA)  Intake/Output from previous day: 07/24 0701 - 07/25 0700 In: 3452.1 [P.O.:720; I.V.:2732.1] Out: 6025 [Urine:6025] Intake/Output this shift: Total I/O In: 250 [P.O.:200; I.V.:50] Out: 400 [Urine:400]  PE:  Awake, alert and oriented x 3. Symmetrical facies. Clear speech, PERRL. Moving all extremities equally.  Rt groin post sheath removal with pressure bandage intact, 2+ right distal pulses with warm right lower extremity. Good urinary output.  Follows all commands. Clear urine in foley. Hypokalemia on today's labs have been addressed.   Lab Results:   [facility] 04/01/12 0405  WBC 6.5  HGB 12.2  HCT 35.8*  PLT 243   BMET  Basename 04/01/12 0405  NA 140  K 3.2*  CL 103  CO2 27  GLUCOSE 119*  BUN 8  CREATININE 0.66  CALCIUM 8.9   Procedure performed :   Author:  Oneal Grout, MD  Service:  Radiology  Author Type:  Physician   Filed:  03/31/12 1155  Note Time:  03/31/12 1153          Pre-procedure Diagnoses   1. 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 CEREBRAL ANGIOGRAM [ION6295]       S/P endovascular coiling of complex ACom aneurysm.  RT CFA approach     Anti-infectives: Anti-infectives     Start     Dose/Rate Route Frequency Ordered Stop   03/31/12 0656   vancomycin (VANCOCIN) 1 GM/200ML IVPB     Comments: TODD,  ROBERT: cabinet override         03/31/12 0656 03/31/12 0904   03/31/12 0630   vancomycin (VANCOCIN) 1,000 mg in sodium chloride 0.9 % 250 mL IVPB  Status:  Discontinued        1,000 mg 250 mL/hr over 60 Minutes Intravenous  Once 03/31/12 0626 03/31/12 1336          Assessment/Plan:  ACOM aneurysm s/p embolization day #2.  Patient has remained neurologically and hemodynamically stable.  Sheath removed - will re-evaluate once bedrest completed, Will advance diet and re-order home meds at this time. Anticipate discharge later today after 6 hours post sheath removal if groin site stable and neurological exam unchanged.     LOS: 1 day    CAMPBELL,PAMELA D 04/01/2012

## 2012-04-01 NOTE — Plan of Care (Signed)
Problem: Phase II Progression Outcomes Goal: Vascular site scale level 0 - I Vascular Site Scale Level 0: No bruising/bleeding/hematoma Level I (Mild): Bruising/Ecchymosis, minimal bleeding/ooozing, palpable hematoma < 3 cm Level II (Moderate): Bleeding not affecting hemodynamic parameters, pseudoaneurysm, palpable hematoma > 3 cm  Outcome: Completed/Met Date Met:  04/01/12 Vascular site at level 0. Dorsalis pedis pulses 2+ bilaterally.

## 2012-04-02 LAB — GLUCOSE, CAPILLARY: Glucose-Capillary: 107 mg/dL — ABNORMAL HIGH (ref 70–99)

## 2012-04-02 NOTE — Care Management Note (Signed)
    Page 1 of 1   04/02/2012     8:42:06 AM   CARE MANAGEMENT NOTE 04/02/2012  Patient:  Elizabeth [name], Elizabeth [name]   Account Number:  [id number]  Date Initiated:  03/31/2012  Documentation initiated by:  [facility]  Subjective/Objective Assessment:   Admitted postop endovascular coiling of complex aneyrysm.     Action/Plan:   Anticipated DC Date:  04/03/2012   Anticipated DC Plan:  HOME/SELF CARE         Choice offered to / List presented to:             Status of service:  Completed, signed off Medicare Important Message given?   (If response is "NO", the following Medicare IM given date fields will be blank) Date Medicare IM given:   Date Additional Medicare IM given:    Discharge Disposition:  HOME/SELF CARE  Per UR Regulation:  Reviewed for med. necessity/level of care/duration of stay  If discussed at Long Length of Stay Meetings, dates discussed:    Comments:

## 2012-04-14 ENCOUNTER — Ambulatory Visit (HOSPITAL_COMMUNITY)
Admit: 2012-04-14 | Discharge: 2012-04-14 | Disposition: A | Discharge status: Home / Self Care | Payer: Medicare Other | Attending: Interventional Radiology | Admitting: Interventional Radiology

## 2012-04-14 ENCOUNTER — Telehealth (HOSPITAL_COMMUNITY): Payer: Self-pay

## 2012-04-14 NOTE — Telephone Encounter (Signed)
Dr. Corliss Skains requested that the pt come in early today.  When I called she stated that she was doing great until this morning.  She got up out of bed to go to the bathroom when she fell.  She is going to come on now.

## 2012-04-15 ENCOUNTER — Encounter: Payer: Self-pay | Admitting: Cardiology

## 2012-05-07 ENCOUNTER — Ambulatory Visit: Payer: Medicare Other | Admitting: Urology

## 2012-06-15 ENCOUNTER — Other Ambulatory Visit (HOSPITAL_COMMUNITY): Payer: Self-pay | Admitting: Interventional Radiology

## 2012-06-15 DIAGNOSIS — I671 Cerebral aneurysm, nonruptured: Secondary | ICD-10-CM

## 2012-07-13 ENCOUNTER — Encounter (HOSPITAL_COMMUNITY): Payer: Self-pay | Admitting: Pharmacist

## 2012-07-16 ENCOUNTER — Other Ambulatory Visit: Payer: Self-pay | Admitting: Radiology

## 2012-07-20 ENCOUNTER — Other Ambulatory Visit (HOSPITAL_COMMUNITY): Payer: Self-pay | Admitting: Interventional Radiology

## 2012-07-20 ENCOUNTER — Ambulatory Visit (HOSPITAL_COMMUNITY)
Admission: RE | Admit: 2012-07-20 | Discharge: 2012-07-20 | Disposition: A | Discharge status: Home / Self Care | Payer: Medicare Other | Source: Ambulatory Visit | Attending: Interventional Radiology | Admitting: Interventional Radiology

## 2012-07-20 ENCOUNTER — Encounter (HOSPITAL_COMMUNITY): Payer: Self-pay

## 2012-07-20 DIAGNOSIS — I671 Cerebral aneurysm, nonruptured: Secondary | ICD-10-CM

## 2012-07-20 DIAGNOSIS — E119 Type 2 diabetes mellitus without complications: Secondary | ICD-10-CM | POA: Insufficient documentation

## 2012-07-20 DIAGNOSIS — E785 Hyperlipidemia, unspecified: Secondary | ICD-10-CM | POA: Insufficient documentation

## 2012-07-20 DIAGNOSIS — E669 Obesity, unspecified: Secondary | ICD-10-CM | POA: Insufficient documentation

## 2012-07-20 DIAGNOSIS — I10 Essential (primary) hypertension: Secondary | ICD-10-CM | POA: Insufficient documentation

## 2012-07-20 DIAGNOSIS — Z09 Encounter for follow-up examination after completed treatment for conditions other than malignant neoplasm: Secondary | ICD-10-CM | POA: Insufficient documentation

## 2012-07-20 DIAGNOSIS — Z9889 Other specified postprocedural states: Secondary | ICD-10-CM | POA: Insufficient documentation

## 2012-07-20 LAB — CBC WITH DIFFERENTIAL/PLATELET
Basophils Absolute: 0 10*3/uL (ref 0.0–0.1)
Basophils Relative: 1 % (ref 0–1)
Eosinophils Absolute: 0.1 10*3/uL (ref 0.0–0.7)
Eosinophils Relative: 2 % (ref 0–5)
HCT: 37.3 % (ref 36.0–46.0)
Hemoglobin: 12.8 g/dL (ref 12.0–15.0)
Lymphocytes Relative: 23 % (ref 12–46)
Lymphs Abs: 1.4 10*3/uL (ref 0.7–4.0)
MCH: 28.1 pg (ref 26.0–34.0)
MCHC: 34.3 g/dL (ref 30.0–36.0)
MCV: 81.8 fL (ref 78.0–100.0)
Monocytes Absolute: 0.4 10*3/uL (ref 0.1–1.0)
Monocytes Relative: 7 % (ref 3–12)
Neutro Abs: 4 10*3/uL (ref 1.7–7.7)
Neutrophils Relative %: 68 % (ref 43–77)
Platelets: 269 10*3/uL (ref 150–400)
RBC: 4.56 MIL/uL (ref 3.87–5.11)
RDW: 12.9 % (ref 11.5–15.5)
WBC: 5.9 10*3/uL (ref 4.0–10.5)

## 2012-07-20 LAB — APTT: aPTT: 34 seconds (ref 24–37)

## 2012-07-20 LAB — GLUCOSE, CAPILLARY: Glucose-Capillary: 132 mg/dL — ABNORMAL HIGH (ref 70–99)

## 2012-07-20 LAB — PROTIME-INR
INR: 1.03 (ref 0.00–1.49)
Prothrombin Time: 13.4 seconds (ref 11.6–15.2)

## 2012-07-20 MED ORDER — FENTANYL CITRATE 0.05 MG/ML IJ SOLN
INTRAMUSCULAR | Status: AC
  Filled 2012-07-20: qty 2

## 2012-07-20 MED ORDER — MIDAZOLAM HCL 2 MG/2ML IJ SOLN
INTRAMUSCULAR | Status: AC
  Filled 2012-07-20: qty 4

## 2012-07-20 MED ORDER — IOHEXOL 300 MG/ML  SOLN
150.0000 mL | Freq: Once | INTRAMUSCULAR | Status: AC | PRN
  Administered 2012-07-20: 50 mL via INTRA_ARTERIAL

## 2012-07-20 MED ORDER — HEPARIN SOD (PORK) LOCK FLUSH 100 UNIT/ML IV SOLN
INTRAVENOUS | Status: AC | PRN
  Administered 2012-07-20: 500 [IU] via INTRAVENOUS

## 2012-07-20 MED ORDER — MIDAZOLAM HCL 2 MG/2ML IJ SOLN
INTRAMUSCULAR | Status: AC | PRN
  Administered 2012-07-20: 1 mg via INTRAVENOUS

## 2012-07-20 MED ORDER — FENTANYL CITRATE 0.05 MG/ML IJ SOLN
INTRAMUSCULAR | Status: AC | PRN
  Administered 2012-07-20: 25 ug via INTRAVENOUS

## 2012-07-20 MED ORDER — SODIUM CHLORIDE 0.9 % IV SOLN
Freq: Once | INTRAVENOUS | Status: AC
  Administered 2012-07-20: 08:00:00 via INTRAVENOUS

## 2012-07-20 MED ORDER — SODIUM CHLORIDE 0.9 % IV SOLN
INTRAVENOUS | Status: AC
  Administered 2012-07-20: 12:00:00 via INTRAVENOUS

## 2012-07-20 NOTE — ED Notes (Signed)
Pt taken up to Short Stay

## 2012-07-20 NOTE — Procedures (Signed)
S/P Bilateral carotid and rt vertebral arteriograms. RT CFA approach. Findings . 1.nearly completely obliterated Acom aneurysm.No compaction or recanalization.2.Stable migrated coil in rt pericallosal artery

## 2012-07-20 NOTE — H&P (Signed)
Chief Complaint: "I"m here for follow up angiogram"  HPI: Elizabeth [name] is an 71 y.o. female who had  ACA aneurysm coiling about 3 months ago. She has been doing well and feeling well. Denies changes to meds or history. No recent illnesses or fevers. Still taking Plavix and ASA  Past Medical History:  Past Medical History  Diagnosis Date  . Diarrhea   . Obesity, unspecified   . Other and unspecified hyperlipidemia   . Unspecified essential hypertension   . Family history of malignant neoplasm of gastrointestinal tract   . Esophageal reflux   . Stricture and stenosis of esophagus   . Diverticulosis of colon (without mention of hemorrhage)   . Personal history of colonic polyps   . Active smoker   . Unspecified asthma   . Type II or unspecified type diabetes mellitus without mention of complication, not stated as uncontrolled   . Chronic airway obstruction, not elsewhere classified   . Headache     Irregular  . Anxiety     Takes Xanax for anxiety    Past Surgical History:  Past Surgical History  Procedure Date  . Total abdominal hysterectomy   . Tonsillectomy   . Lumbar disc surgery   . Knee arthroscopy     left  . Appendectomy   . Cardiac catheterization 2006    LAD: 30%, RCA : 20%, normal EF, elevated LVEDP  . Cardiac catheterization   . Back surgery     Spinal surgery    Family History:  Family History  Problem Relation Age of Onset  . Colon cancer Other     first cousin, paternal aunt and uncle  . Cancer Paternal Grandmother     gastric  . Breast cancer Maternal Aunt     several paternal cousins  . Diabetes Mother   . Heart disease Mother   . Heart disease Maternal Grandmother   . Thyroid cancer Other     aunt  . Thyroid disease Sister     uncertain type-not cancer    Social History:  reports that she quit smoking about 4 months ago. Her smoking use included Cigarettes. She quit after 20 years of use. She has never used smokeless tobacco. She reports that  she does not drink alcohol or use illicit drugs.  Allergies:  Allergies  Allergen Reactions  . Banana Anaphylaxis  . Benicar (Olmesartan Medoxomil) Other (See Comments)    Feel bad  . Codeine Nausea And Vomiting  . Diovan (Valsartan) Other (See Comments)    Feel bad  . Latex     REACTION: red rash  . Spinach     Stomach problems  . Ace Inhibitors Cough  . Penicillins Rash  . Sulfonamide Derivatives Rash    Medications: ALPRAZolam (XANAX) 0.25 MG tablet (Taking) Sig - Route: Take 0.25 mg by mouth at bedtime as needed. For sleep - Oral Class: Historical Med Number of times this order has been changed since signing: 5 [address] amLODipine (NORVASC) 10 MG tablet (Taking) Sig - Route: Take 10 mg by mouth daily. - Oral Class: Historical Med Number of times this order has been changed since signing: 5 Order [address] clopidogrel (PLAVIX) 75 MG tablet (Taking) 30 tablet 0 02/29/2012 02/28/2013 Sig - Route: Take 1 tablet (75 mg total) by mouth daily with breakfast. - Oral Class: Print Number of times this order has been changed since signing: 2 Order [address] ergocalciferol (VITAMIN D2) 50000 UNITS capsule (Taking) Sig - Route:  Take 50,000 Units by mouth once a week. Wednesdays - Oral Class: Historical Med Number of times this order has been changed since signing: 3 Order [address] metoCLOPramide (REGLAN) 10 MG tablet (Taking) Sig - Route: Take 10 mg by mouth at bedtime. - Oral Class: Historical Med Number of times this order has been changed since signing: 5 Order [address] oxybutynin (DITROPAN XL) 15 MG 24 hr tablet (Taking) Sig - Route: Take 15 mg by mouth daily. - Oral Class: Historical Med Number of times this order has been changed since signing: 4 Order [address] rosuvastatin (CRESTOR) 20 MG tablet (Taking) Sig - Route: Take 20 mg by mouth at bedtime. - Oral Class: Historical Med Number of times this order has been changed since signing: 4 Order [address] sitaGLIPtan-metformin  (JANUMET) 50-1000 MG per tablet (Taking) Sig - Route: Take 1 tablet by mouth every morning. - Oral Class: Historical Med Number of times this order has been changed since signing: 5 Order [address] hydrochlorothiazide (MICROZIDE) 12.5 MG capsule (Taking/Discontinued) 30 capsule 0 02/29/2012 07/13/2012 Sig - Route: Take 1 capsule (12.5 mg total) by mouth daily. - Oral Class: Print Number of times this order has been changed since signing: 1 Order [address] aspirin 81 MG tablet (Discontinued) 15 tablet 0 02/29/2012 07/13/2012 Sig - Route: Take 1 tablet (81 mg total) by mouth daily. - Oral Class: Print Reason for Discontinue: Dose change diphenhydrAMINE (BENADRYL) 25 MG tablet (Discontinued) 07/13/2012 Sig - Route: Take 25 mg by mouth at bedtime as needed. - Oral Class: Historical Med   Please HPI for pertinent positives, otherwise complete 10 system ROS negative.  Physical Exam: Blood pressure 135/76, pulse 71, temperature 97 F (36.1 C), temperature source Oral, resp. rate 18, height 5\' 2"  (1.575 m), weight 185 lb (83.915 kg), SpO2 97.00%. Body mass index is 33.84 kg/(m^2).   General Appearance:  Alert, cooperative, no distress, appears stated age  Head:  Normocephalic, without obvious abnormality, atraumatic  ENT: Unremarkable  Neck: Supple, symmetrical, trachea midline, no adenopathy, thyroid: not enlarged, symmetric, no tenderness/mass/nodules  Lungs:   Clear to auscultation bilaterally, no w/r/r, respirations unlabored without use of accessory muscles.  Chest Wall:  No tenderness or deformity  Heart:  Regular rate and rhythm, S1, S2 normal, no murmur, rub or gallop. Carotids 2+ without bruit.  Extremities: Extremities normal, atraumatic, no cyanosis or edema  Pulses: 2+ and symmetric  Neurologic: Normal affect, no gross deficits.   Results for orders placed during the hospital encounter of 07/20/12 (from the past 48 hour(s))  GLUCOSE, CAPILLARY     Status: Abnormal   Collection Time    07/20/12  7:45 AM      Component Value Range Comment   Glucose-Capillary 132 (*) 70 - 99 mg/dL   APTT     Status: Normal   Collection Time   07/20/12  7:52 AM      Component Value Range Comment   aPTT 34  24 - 37 seconds   CBC WITH DIFFERENTIAL     Status: Normal   Collection Time   07/20/12  7:52 AM      Component Value Range Comment   WBC 5.9  4.0 - 10.5 K/uL    RBC 4.56  3.87 - 5.11 MIL/uL    Hemoglobin 12.8  12.0 - 15.0 g/dL    HCT 56.3  87.5 - 64.3 %    MCV 81.8  78.0 - 100.0 fL    MCH 28.1  26.0 - 34.0  pg    MCHC 34.3  30.0 - 36.0 g/dL    RDW 91.4  78.2 - 95.6 %    Platelets 269  150 - 400 K/uL    Neutrophils Relative 68  43 - 77 %    Neutro Abs 4.0  1.7 - 7.7 K/uL    Lymphocytes Relative 23  12 - 46 %    Lymphs Abs 1.4  0.7 - 4.0 K/uL    Monocytes Relative 7  3 - 12 %    Monocytes Absolute 0.4  0.1 - 1.0 K/uL    Eosinophils Relative 2  0 - 5 %    Eosinophils Absolute 0.1  0.0 - 0.7 K/uL    Basophils Relative 1  0 - 1 %    Basophils Absolute 0.0  0.0 - 0.1 K/uL   PROTIME-INR     Status: Normal   Collection Time   07/20/12  7:52 AM      Component Value Range Comment   Prothrombin Time 13.4  11.6 - 15.2 seconds    INR 1.03  0.00 - 1.49    No results found.  Assessment/Plan S/p recent ACA coiling For follow up angio today Labs reviewed, BMET pending Discussed procedure and risks. Consent signed in chart  Brayton El PA-C 07/20/2012, 9:07 AM

## 2012-07-23 ENCOUNTER — Ambulatory Visit: Payer: Medicare Other | Admitting: Urology

## 2012-08-18 ENCOUNTER — Other Ambulatory Visit: Payer: Self-pay | Admitting: Family Medicine

## 2012-08-18 DIAGNOSIS — R222 Localized swelling, mass and lump, trunk: Secondary | ICD-10-CM

## 2012-08-24 ENCOUNTER — Ambulatory Visit (HOSPITAL_COMMUNITY)
Admission: RE | Admit: 2012-08-24 | Discharge: 2012-08-24 | Disposition: A | Discharge status: Home / Self Care | Payer: Medicare Other | Source: Ambulatory Visit | Attending: Family Medicine | Admitting: Family Medicine

## 2012-08-24 DIAGNOSIS — R911 Solitary pulmonary nodule: Secondary | ICD-10-CM | POA: Insufficient documentation

## 2012-08-24 DIAGNOSIS — R222 Localized swelling, mass and lump, trunk: Secondary | ICD-10-CM

## 2012-09-14 ENCOUNTER — Ambulatory Visit: Payer: Medicare Other | Admitting: Urology

## 2012-09-24 ENCOUNTER — Ambulatory Visit (INDEPENDENT_AMBULATORY_CARE_PROVIDER_SITE_OTHER): Payer: Medicare Other | Admitting: Urology

## 2012-09-24 DIAGNOSIS — N3946 Mixed incontinence: Secondary | ICD-10-CM

## 2012-11-24 ENCOUNTER — Other Ambulatory Visit: Payer: Self-pay | Admitting: Family Medicine

## 2012-11-25 NOTE — Telephone Encounter (Signed)
Chart said 1 bid then had 1/2 qd prn. Will send chart back for review. Thanks.

## 2012-12-03 ENCOUNTER — Other Ambulatory Visit (HOSPITAL_COMMUNITY): Payer: Self-pay | Admitting: Interventional Radiology

## 2012-12-03 DIAGNOSIS — R51 Headache: Secondary | ICD-10-CM

## 2012-12-03 DIAGNOSIS — I729 Aneurysm of unspecified site: Secondary | ICD-10-CM

## 2012-12-15 ENCOUNTER — Other Ambulatory Visit: Payer: Self-pay | Admitting: Radiology

## 2012-12-20 ENCOUNTER — Ambulatory Visit (INDEPENDENT_AMBULATORY_CARE_PROVIDER_SITE_OTHER): Payer: Medicare Other | Admitting: Family Medicine

## 2012-12-20 ENCOUNTER — Other Ambulatory Visit: Payer: Self-pay | Admitting: Radiology

## 2012-12-20 ENCOUNTER — Other Ambulatory Visit: Payer: Medicare Other

## 2012-12-20 VITALS — BP 138/70 | Temp 97.8°F | Ht 62.0 in | Wt 192.0 lb

## 2012-12-20 DIAGNOSIS — E119 Type 2 diabetes mellitus without complications: Secondary | ICD-10-CM

## 2012-12-20 DIAGNOSIS — Z1212 Encounter for screening for malignant neoplasm of rectum: Secondary | ICD-10-CM

## 2012-12-20 DIAGNOSIS — Z79899 Other long term (current) drug therapy: Secondary | ICD-10-CM

## 2012-12-20 DIAGNOSIS — R5383 Other fatigue: Secondary | ICD-10-CM

## 2012-12-20 DIAGNOSIS — E559 Vitamin D deficiency, unspecified: Secondary | ICD-10-CM

## 2012-12-20 DIAGNOSIS — J309 Allergic rhinitis, unspecified: Secondary | ICD-10-CM

## 2012-12-20 DIAGNOSIS — E1059 Type 1 diabetes mellitus with other circulatory complications: Secondary | ICD-10-CM

## 2012-12-20 DIAGNOSIS — J45909 Unspecified asthma, uncomplicated: Secondary | ICD-10-CM

## 2012-12-20 DIAGNOSIS — E785 Hyperlipidemia, unspecified: Secondary | ICD-10-CM

## 2012-12-20 DIAGNOSIS — I10 Essential (primary) hypertension: Secondary | ICD-10-CM

## 2012-12-20 DIAGNOSIS — R5381 Other malaise: Secondary | ICD-10-CM

## 2012-12-20 LAB — POCT CBC
Granulocyte percent: 82 %G — AB (ref 37–80)
HCT, POC: 38.7 % (ref 37.7–47.9)
Hemoglobin: 13 g/dL (ref 12.2–16.2)
Lymph, poc: 1.2 (ref 0.6–3.4)
MCH, POC: 28.3 pg (ref 27–31.2)
MCHC: 33.6 g/dL (ref 31.8–35.4)
MCV: 84.2 fL (ref 80–97)
MPV: 7.6 fL (ref 0–99.8)
POC Granulocyte: 7.2 — AB (ref 2–6.9)
POC LYMPH PERCENT: 13.8 %L (ref 10–50)
Platelet Count, POC: 275 10*3/uL (ref 142–424)
RBC: 4.6 M/uL (ref 4.04–5.48)
RDW, POC: 14 %
WBC: 8.8 10*3/uL (ref 4.6–10.2)

## 2012-12-20 LAB — HEPATIC FUNCTION PANEL
ALT: 25 U/L (ref 0–35)
AST: 26 U/L (ref 0–37)
Albumin: 4.1 g/dL (ref 3.5–5.2)
Alkaline Phosphatase: 72 U/L (ref 39–117)
Bilirubin, Direct: 0.1 mg/dL (ref 0.0–0.3)
Indirect Bilirubin: 0.3 mg/dL (ref 0.0–0.9)
Total Bilirubin: 0.4 mg/dL (ref 0.3–1.2)
Total Protein: 7.1 g/dL (ref 6.0–8.3)

## 2012-12-20 LAB — BASIC METABOLIC PANEL
BUN: 10 mg/dL (ref 6–23)
CO2: 28 mEq/L (ref 19–32)
Calcium: 9.6 mg/dL (ref 8.4–10.5)
Chloride: 99 mEq/L (ref 96–112)
Creat: 0.88 mg/dL (ref 0.50–1.10)
Glucose, Bld: 120 mg/dL — ABNORMAL HIGH (ref 70–99)
Potassium: 3.4 mEq/L — ABNORMAL LOW (ref 3.5–5.3)
Sodium: 138 mEq/L (ref 135–145)

## 2012-12-20 LAB — POCT GLYCOSYLATED HEMOGLOBIN (HGB A1C): Hemoglobin A1C: 5.9

## 2012-12-20 MED ORDER — METHYLPREDNISOLONE ACETATE 80 MG/ML IJ SUSP
60.0000 mg | Freq: Once | INTRAMUSCULAR | Status: AC
  Administered 2012-12-20: 60 mg via INTRAMUSCULAR

## 2012-12-20 MED ORDER — METHYLPREDNISOLONE ACETATE 80 MG/ML IJ SUSP: 80.0000 mg | Freq: Once | INTRAMUSCULAR | Status: DC

## 2012-12-20 NOTE — Patient Instructions (Addendum)
1. Take meds as prescribed 2. Use a cool mist humidifier especially during the winter months and when the heat has  been on. 3. Use saline nose sprays frequently (or) 4. Saline irrigations of the nose can be very helpful if done frequently.  * 4X daily for 1 week*  * Use of a nettie pot can be helpful with this. Follow directions with this equipment* 5. Drink plenty of fluids 6. Keep thermostat at lower temperatures 7.For any cough or congestion  For Adults use plain Mucinex- regular strength or max strength   *For Children use Children's mucinex and or consult with Pharmacist for dosing 8. For fever or aches or pains- take tylenol or ibuprofen appropriate for age and weight.  * for fevers greater than 101 orally you may alternate ibuprofen and tylenol every  3 hours. 9.  Use albuterol as needed and call if refills are needed 10. Increase fluticasone 2 sprays each nostril daily

## 2012-12-20 NOTE — Progress Notes (Signed)
  Subjective:    Patient ID: Elizabeth [name], female    DOB: 1940/10/14, 72 y.o.   MRN: [id number]  HPI patient presents today with increased problems with allergies had congestion and cough   Review of Systems  Constitutional: Negative.   HENT: Positive for congestion, sneezing, voice change, postnasal drip and sinus pressure.   Eyes: Negative.   Respiratory: Positive for wheezing.   Cardiovascular: Negative.   Gastrointestinal: Negative.   Endocrine: Negative.   Genitourinary: Negative.   Musculoskeletal: Negative.   Skin: Negative.   Allergic/Immunologic: Negative.   Neurological: Negative.   Hematological: Negative.   Psychiatric/Behavioral: Negative.        Objective:   Physical Exam  Constitutional: She is oriented to person, place, and time. She appears well-developed and well-nourished.  HENT:  Head: Normocephalic and atraumatic.  Left Ear: External ear normal.  Mouth/Throat: No oropharyngeal exudate.  Bilateral nasal congestion  Eyes: Conjunctivae are normal. Right eye exhibits no discharge.  Neck: Normal range of motion. Neck supple. No thyromegaly present.  Cardiovascular: Normal rate, regular rhythm, normal heart sounds and intact distal pulses.  Exam reveals no gallop.   No murmur heard. Pulmonary/Chest: Effort normal and breath sounds normal. No respiratory distress. She has no wheezes. She has no rales.  She did have chest congestion with coughing  Abdominal: Soft. Bowel sounds are normal. There is no tenderness. There is no guarding.  Musculoskeletal: Normal range of motion.  Lymphadenopathy:    She has no cervical adenopathy.  Neurological: She is alert and oriented to person, place, and time. She has normal reflexes.  Skin: Skin is warm and dry.  Psychiatric: Her behavior is normal. Judgment and thought content normal.          Assessment & Plan:  Allergic rhinitis Depo-Medrol 60 mg today Increase Flonase to 2 sprays each nostril once a  day  History of diabetes  History of CVA She is scheduled for a cerebral angiogram on April 16 by Dr. Titus Dubin

## 2012-12-20 NOTE — Addendum Note (Signed)
Addended by: Orma Render F on: 12/20/2012 11:01 AM   Modules accepted: Orders

## 2012-12-20 NOTE — Progress Notes (Signed)
Tolerated injection well. 

## 2012-12-21 ENCOUNTER — Encounter (HOSPITAL_COMMUNITY): Payer: Self-pay | Admitting: Pharmacy Technician

## 2012-12-21 LAB — VITAMIN D 25 HYDROXY (VIT D DEFICIENCY, FRACTURES): Vit D, 25-Hydroxy: 37 ng/mL (ref 30–89)

## 2012-12-21 LAB — FECAL OCCULT BLOOD, IMMUNOCHEMICAL: Fecal Occult Blood: POSITIVE — AB

## 2012-12-22 ENCOUNTER — Ambulatory Visit (HOSPITAL_COMMUNITY)
Admission: RE | Admit: 2012-12-22 | Discharge: 2012-12-22 | Disposition: A | Discharge status: Home / Self Care | Payer: Medicare Other | Source: Ambulatory Visit | Attending: Interventional Radiology | Admitting: Interventional Radiology

## 2012-12-22 ENCOUNTER — Other Ambulatory Visit (HOSPITAL_COMMUNITY): Payer: Self-pay | Admitting: Interventional Radiology

## 2012-12-22 ENCOUNTER — Encounter (HOSPITAL_COMMUNITY): Payer: Self-pay

## 2012-12-22 DIAGNOSIS — K219 Gastro-esophageal reflux disease without esophagitis: Secondary | ICD-10-CM | POA: Insufficient documentation

## 2012-12-22 DIAGNOSIS — I10 Essential (primary) hypertension: Secondary | ICD-10-CM | POA: Insufficient documentation

## 2012-12-22 DIAGNOSIS — R51 Headache: Secondary | ICD-10-CM

## 2012-12-22 DIAGNOSIS — I729 Aneurysm of unspecified site: Secondary | ICD-10-CM

## 2012-12-22 DIAGNOSIS — Z87891 Personal history of nicotine dependence: Secondary | ICD-10-CM | POA: Insufficient documentation

## 2012-12-22 DIAGNOSIS — E119 Type 2 diabetes mellitus without complications: Secondary | ICD-10-CM | POA: Insufficient documentation

## 2012-12-22 DIAGNOSIS — Z885 Allergy status to narcotic agent status: Secondary | ICD-10-CM | POA: Insufficient documentation

## 2012-12-22 DIAGNOSIS — E669 Obesity, unspecified: Secondary | ICD-10-CM | POA: Insufficient documentation

## 2012-12-22 DIAGNOSIS — J4489 Other specified chronic obstructive pulmonary disease: Secondary | ICD-10-CM | POA: Insufficient documentation

## 2012-12-22 DIAGNOSIS — Z882 Allergy status to sulfonamides status: Secondary | ICD-10-CM | POA: Insufficient documentation

## 2012-12-22 DIAGNOSIS — Z880 Allergy status to penicillin: Secondary | ICD-10-CM | POA: Insufficient documentation

## 2012-12-22 DIAGNOSIS — K222 Esophageal obstruction: Secondary | ICD-10-CM | POA: Insufficient documentation

## 2012-12-22 DIAGNOSIS — F411 Generalized anxiety disorder: Secondary | ICD-10-CM | POA: Insufficient documentation

## 2012-12-22 DIAGNOSIS — I672 Cerebral atherosclerosis: Secondary | ICD-10-CM | POA: Insufficient documentation

## 2012-12-22 DIAGNOSIS — I671 Cerebral aneurysm, nonruptured: Secondary | ICD-10-CM | POA: Insufficient documentation

## 2012-12-22 DIAGNOSIS — Z91040 Latex allergy status: Secondary | ICD-10-CM | POA: Insufficient documentation

## 2012-12-22 DIAGNOSIS — Z888 Allergy status to other drugs, medicaments and biological substances status: Secondary | ICD-10-CM | POA: Insufficient documentation

## 2012-12-22 DIAGNOSIS — Z91018 Allergy to other foods: Secondary | ICD-10-CM | POA: Insufficient documentation

## 2012-12-22 DIAGNOSIS — E785 Hyperlipidemia, unspecified: Secondary | ICD-10-CM | POA: Insufficient documentation

## 2012-12-22 DIAGNOSIS — J449 Chronic obstructive pulmonary disease, unspecified: Secondary | ICD-10-CM | POA: Insufficient documentation

## 2012-12-22 LAB — BASIC METABOLIC PANEL
BUN: 11 mg/dL (ref 6–23)
CO2: 28 mEq/L (ref 19–32)
Calcium: 9.5 mg/dL (ref 8.4–10.5)
Chloride: 100 mEq/L (ref 96–112)
Creatinine, Ser: 0.81 mg/dL (ref 0.50–1.10)
GFR calc Af Amer: 84 mL/min — ABNORMAL LOW (ref >90)
GFR calc non Af Amer: 72 mL/min — ABNORMAL LOW (ref >90)
Glucose, Bld: 111 mg/dL — ABNORMAL HIGH (ref 70–99)
Potassium: 3.2 mEq/L — ABNORMAL LOW (ref 3.5–5.1)
Sodium: 138 mEq/L (ref 135–145)

## 2012-12-22 LAB — APTT: aPTT: 25 seconds (ref 24–37)

## 2012-12-22 LAB — PROTIME-INR
INR: 1.06 (ref 0.00–1.49)
Prothrombin Time: 13.7 seconds (ref 11.6–15.2)

## 2012-12-22 LAB — NMR LIPOPROFILE WITH LIPIDS
Cholesterol, Total: 240 mg/dL — ABNORMAL HIGH (ref <200)
HDL Particle Number: 30.4 umol/L — ABNORMAL LOW (ref >=30.5)
HDL Size: 9 nm — ABNORMAL LOW (ref >=9.2)
HDL-C: 54 mg/dL (ref >=40)
LDL (calc): 166 mg/dL — ABNORMAL HIGH (ref <100)
LDL Particle Number: 2001 nmol/L — ABNORMAL HIGH (ref <1000)
LDL Size: 20.7 nm (ref >20.5)
LP-IR Score: 49 — ABNORMAL HIGH (ref <=45)
Large HDL-P: 6.3 umol/L (ref >=4.8)
Large VLDL-P: 2.6 nmol/L (ref <=2.7)
Small LDL Particle Number: 890 nmol/L — ABNORMAL HIGH (ref <=527)
Triglycerides: 101 mg/dL (ref <150)
VLDL Size: 44 nm (ref <=46.6)

## 2012-12-22 LAB — GLUCOSE, CAPILLARY: Glucose-Capillary: 108 mg/dL — ABNORMAL HIGH (ref 70–99)

## 2012-12-22 LAB — CBC WITH DIFFERENTIAL/PLATELET
Basophils Absolute: 0 10*3/uL (ref 0.0–0.1)
Basophils Relative: 1 % (ref 0–1)
Eosinophils Absolute: 0.2 10*3/uL (ref 0.0–0.7)
Eosinophils Relative: 3 % (ref 0–5)
HCT: 34.6 % — ABNORMAL LOW (ref 36.0–46.0)
Hemoglobin: 12.2 g/dL (ref 12.0–15.0)
Lymphocytes Relative: 33 % (ref 12–46)
Lymphs Abs: 1.8 10*3/uL (ref 0.7–4.0)
MCH: 27.7 pg (ref 26.0–34.0)
MCHC: 35.3 g/dL (ref 30.0–36.0)
MCV: 78.6 fL (ref 78.0–100.0)
Monocytes Absolute: 0.5 10*3/uL (ref 0.1–1.0)
Monocytes Relative: 10 % (ref 3–12)
Neutro Abs: 2.9 10*3/uL (ref 1.7–7.7)
Neutrophils Relative %: 53 % (ref 43–77)
Platelets: 277 10*3/uL (ref 150–400)
RBC: 4.4 MIL/uL (ref 3.87–5.11)
RDW: 13.5 % (ref 11.5–15.5)
WBC: 5.5 10*3/uL (ref 4.0–10.5)

## 2012-12-22 MED ORDER — MIDAZOLAM HCL 2 MG/2ML IJ SOLN
INTRAMUSCULAR | Status: AC | PRN
  Administered 2012-12-22: 1 mg via INTRAVENOUS

## 2012-12-22 MED ORDER — SODIUM CHLORIDE 0.9 % IV SOLN: INTRAVENOUS | Status: AC

## 2012-12-22 MED ORDER — SODIUM CHLORIDE 0.9 % IV SOLN
Freq: Once | INTRAVENOUS | Status: AC
  Administered 2012-12-22: 07:00:00 via INTRAVENOUS

## 2012-12-22 MED ORDER — FENTANYL CITRATE 0.05 MG/ML IJ SOLN
INTRAMUSCULAR | Status: AC | PRN
  Administered 2012-12-22: 25 ug via INTRAVENOUS

## 2012-12-22 MED ORDER — MIDAZOLAM HCL 2 MG/2ML IJ SOLN
INTRAMUSCULAR | Status: AC
  Filled 2012-12-22: qty 4

## 2012-12-22 MED ORDER — FENTANYL CITRATE 0.05 MG/ML IJ SOLN
INTRAMUSCULAR | Status: AC
  Filled 2012-12-22: qty 4

## 2012-12-22 MED ORDER — HEPARIN SOD (PORK) LOCK FLUSH 100 UNIT/ML IV SOLN
INTRAVENOUS | Status: AC | PRN
  Administered 2012-12-22: 500 [IU] via INTRAVENOUS

## 2012-12-22 MED ORDER — IOHEXOL 300 MG/ML  SOLN
150.0000 mL | Freq: Once | INTRAMUSCULAR | Status: AC | PRN
  Administered 2012-12-22: 70 mL via INTRA_ARTERIAL

## 2012-12-22 NOTE — Procedures (Signed)
S/P 4 vessel cerebral arteriogran  Rt CFa approach. Findings . 1. Obliterated Acom aneurysm.No compaction No neck  remnant  seen.

## 2012-12-22 NOTE — H&P (Signed)
Elizabeth [name] is an 72 y.o. female.   Chief Complaint: Previously embolized Anterior Communicating Artery Aneurysm 03/31/12 Returns for follow up cerebral arteriogram Not smoking No complaints HPI: HTN; GERD; divertic; DM  Past Medical History  Diagnosis Date  . Diarrhea   . Obesity, unspecified   . Other and unspecified hyperlipidemia   . Unspecified essential hypertension   . Family history of malignant neoplasm of gastrointestinal tract   . Esophageal reflux   . Stricture and stenosis of esophagus   . Diverticulosis of colon (without mention of hemorrhage)   . Personal history of colonic polyps   . Active smoker   . Unspecified asthma   . Type II or unspecified type diabetes mellitus without mention of complication, not stated as uncontrolled   . Chronic airway obstruction, not elsewhere classified   . Headache     Irregular  . Anxiety     Takes Xanax for anxiety    Past Surgical History  Procedure Laterality Date  . Total abdominal hysterectomy    . Tonsillectomy    . Lumbar disc surgery    . Knee arthroscopy      left  . Appendectomy    . Cardiac catheterization  2006    LAD: 30%, RCA : 20%, normal EF, elevated LVEDP  . Cardiac catheterization    . Back surgery      Spinal surgery    Family History  Problem Relation Age of Onset  . Colon cancer Other     first cousin, paternal aunt and uncle  . Cancer Paternal Grandmother     gastric  . Breast cancer Maternal Aunt     several paternal cousins  . Diabetes Mother   . Heart disease Mother   . Heart disease Maternal Grandmother   . Thyroid cancer Other     aunt  . Thyroid disease Sister     uncertain type-not cancer   Social History:  reports that she quit smoking about 9 months ago. Her smoking use included Cigarettes. She smoked 0.00 packs per day for 20 years. She has never used smokeless tobacco. She reports that she does not drink alcohol or use illicit drugs.  Allergies:  Allergies  Allergen  Reactions  . Banana Anaphylaxis  . Actos (Pioglitazone) Swelling  . Benicar (Olmesartan Medoxomil) Other (See Comments)    Feel bad  . Chantix (Varenicline) Other (See Comments)    "Worked opposite."  . Codeine Nausea And Vomiting  . Diovan (Valsartan) Other (See Comments)    Feel bad  . Latex     REACTION: red rash  . Lipitor (Atorvastatin) Other (See Comments)    REACTION:  Joint pain  . Spinach     Stomach problems  . Vicodin (Hydrocodone-Acetaminophen) Nausea And Vomiting  . Zocor (Simvastatin) Other (See Comments)    REACTION:  "made liver function incorrectly"  . Ace Inhibitors Cough  . Celebrex (Celecoxib) Rash  . Penicillins Rash  . Sulfonamide Derivatives Rash     (Not in a hospital admission)  Results for orders placed during the hospital encounter of 12/22/12 (from the past 48 hour(s))  GLUCOSE, CAPILLARY     Status: Abnormal   Collection Time    12/22/12  6:33 AM      Result Value Range   Glucose-Capillary 108 (*) 70 - 99 mg/dL  APTT     Status: None   Collection Time    12/22/12  6:37 AM      Result Value Range  aPTT 25  24 - 37 seconds  BASIC METABOLIC PANEL     Status: Abnormal   Collection Time    12/22/12  6:37 AM      Result Value Range   Sodium 138  135 - 145 mEq/L   Potassium 3.2 (*) 3.5 - 5.1 mEq/L   Chloride 100  96 - 112 mEq/L   CO2 28  19 - 32 mEq/L   Glucose, Bld 111 (*) 70 - 99 mg/dL   BUN 11  6 - 23 mg/dL   Creatinine, Ser 6.57  0.50 - 1.10 mg/dL   Calcium 9.5  8.4 - 84.6 mg/dL   GFR calc non Af Amer 72 (*) >90 mL/min   GFR calc Af Amer 84 (*) >90 mL/min   Comment:            The eGFR has been calculated     using the CKD EPI equation.     This calculation has not been     validated in all clinical     situations.     eGFR's persistently     <90 mL/min signify     possible Chronic Kidney Disease.  CBC WITH DIFFERENTIAL     Status: Abnormal   Collection Time    12/22/12  6:37 AM      Result Value Range   WBC 5.5  4.0 - 10.5  K/uL   RBC 4.40  3.87 - 5.11 MIL/uL   Hemoglobin 12.2  12.0 - 15.0 g/dL   HCT 96.2 (*) 95.2 - 84.1 %   MCV 78.6  78.0 - 100.0 fL   MCH 27.7  26.0 - 34.0 pg   MCHC 35.3  30.0 - 36.0 g/dL   RDW 32.4  40.1 - 02.7 %   Platelets 277  150 - 400 K/uL   Neutrophils Relative 53  43 - 77 %   Neutro Abs 2.9  1.7 - 7.7 K/uL   Lymphocytes Relative 33  12 - 46 %   Lymphs Abs 1.8  0.7 - 4.0 K/uL   Monocytes Relative 10  3 - 12 %   Monocytes Absolute 0.5  0.1 - 1.0 K/uL   Eosinophils Relative 3  0 - 5 %   Eosinophils Absolute 0.2  0.0 - 0.7 K/uL   Basophils Relative 1  0 - 1 %   Basophils Absolute 0.0  0.0 - 0.1 K/uL  PROTIME-INR     Status: None   Collection Time    12/22/12  6:37 AM      Result Value Range   Prothrombin Time 13.7  11.6 - 15.2 seconds   INR 1.06  0.00 - 1.49   No results found.  Review of Systems  Constitutional: Negative for fever and weight loss.  HENT: Negative for hearing loss, neck pain and tinnitus.   Eyes: Negative for blurred vision and double vision.  Respiratory: Negative for cough, sputum production and shortness of breath.   Cardiovascular: Negative for chest pain.  Gastrointestinal: Negative for nausea, vomiting and abdominal pain.  Neurological: Negative for dizziness, tingling, tremors, weakness and headaches.    Blood pressure 143/81, pulse 60, temperature 97.9 F (36.6 C), temperature source Oral, resp. rate 18, height 5\' 2"  (1.575 m), weight 190 lb (86.183 kg), SpO2 95.00%. Physical Exam  Constitutional: She is oriented to person, place, and time. She appears well-developed and well-nourished.  Eyes: EOM are normal.  Neck: Normal range of motion.  Cardiovascular: Normal rate, regular rhythm and normal heart  sounds.   No murmur heard. Respiratory: Effort normal and breath sounds normal. She has no wheezes.  GI: Soft. Bowel sounds are normal. There is no tenderness.  Musculoskeletal: Normal range of motion. She exhibits no edema.  Neurological: She  is alert and oriented to person, place, and time. Coordination normal.  Psychiatric: She has a normal mood and affect. Her behavior is normal. Judgment and thought content normal.     Assessment/Plan A COM aneurysm embolization 03/2012 No sxs; no complaints Not smoking Scheduled for recheck cerebral arteriogram Pt aware of procedure benefits and risks and agreeable to proceed Consent signed and in chart  TURPIN,PAMELA A 12/22/2012, 7:30 AM

## 2012-12-29 ENCOUNTER — Ambulatory Visit: Payer: Self-pay | Admitting: Neurology

## 2013-01-08 ENCOUNTER — Other Ambulatory Visit: Payer: Self-pay | Admitting: Family Medicine

## 2013-01-10 NOTE — Telephone Encounter (Signed)
If she was taking the 50,000 weekly and her numbers were still low,then she needs to continue to 50,000 weekly and add the D3 1000 daily

## 2013-01-10 NOTE — Telephone Encounter (Signed)
Per last lab note you wanted pt to increase vit d by 1,000mg  qd. Is she to continue with this weekly supplement? Please advise. Thank you

## 2013-01-11 ENCOUNTER — Other Ambulatory Visit: Payer: Medicare Other

## 2013-01-28 ENCOUNTER — Telehealth: Payer: Self-pay | Admitting: Family Medicine

## 2013-02-07 ENCOUNTER — Ambulatory Visit: Payer: Medicare Other | Admitting: Cardiology

## 2013-02-08 ENCOUNTER — Telehealth: Payer: Self-pay | Admitting: Family Medicine

## 2013-02-08 NOTE — Telephone Encounter (Signed)
APPT MADE

## 2013-02-09 ENCOUNTER — Other Ambulatory Visit: Payer: Self-pay

## 2013-02-09 MED ORDER — METOCLOPRAMIDE HCL 10 MG PO TABS: ORAL_TABLET | ORAL | Status: DC

## 2013-02-09 MED ORDER — FLUTICASONE PROPIONATE 50 MCG/ACT NA SUSP: 1.0000 | Freq: Every day | NASAL | Status: DC

## 2013-02-21 ENCOUNTER — Encounter: Payer: Self-pay | Admitting: Physician Assistant

## 2013-02-21 ENCOUNTER — Ambulatory Visit (INDEPENDENT_AMBULATORY_CARE_PROVIDER_SITE_OTHER): Payer: Medicare Other | Admitting: Physician Assistant

## 2013-02-21 VITALS — BP 143/84 | HR 74 | Temp 96.7°F | Wt 194.0 lb

## 2013-02-21 DIAGNOSIS — R112 Nausea with vomiting, unspecified: Secondary | ICD-10-CM

## 2013-02-21 NOTE — Patient Instructions (Addendum)
Abdominal Pain Abdominal pain can be caused by many things. Your caregiver decides the seriousness of your pain by an examination and possibly blood tests and X-rays. Many cases can be observed and treated at home. Most abdominal pain is not caused by a disease and will probably improve without treatment. However, in many cases, more time must pass before a clear cause of the pain can be found. Before that point, it may not be known if you need more testing, or if hospitalization or surgery is needed. HOME CARE INSTRUCTIONS   Do not take laxatives unless directed by your caregiver.  Take pain medicine only as directed by your caregiver.  Only take over-the-counter or prescription medicines for pain, discomfort, or fever as directed by your caregiver.  Try a clear liquid diet (broth, tea, or water) for as long as directed by your caregiver. Slowly move to a bland diet as tolerated. SEEK IMMEDIATE MEDICAL CARE IF:   The pain does not go away.  You have a fever.  You keep throwing up (vomiting).  The pain is felt only in portions of the abdomen. Pain in the right side could possibly be appendicitis. In an adult, pain in the left lower portion of the abdomen could be colitis or diverticulitis.  You pass bloody or black tarry stools. MAKE SURE YOU:   Understand these instructions.  Will watch your condition.  Will get help right away if you are not doing well or get worse. Document Released: 06/04/2005 Document Revised: 11/17/2011 Document Reviewed: 04/12/2008 [facility] Patient Information 2014 [place], [place]. Cholecystitis Cholecystitis is an inflammation of your gallbladder. It is usually caused by a buildup of gallstones or sludge (cholelithiasis) in your gallbladder. The gallbladder stores a fluid that helps digest fats (bile). Cholecystitis is serious and needs treatment right away.  CAUSES  Gallstones. Gallstones can block the tube that leads to your gallbladder, causing bile to  build up. As bile builds up, the gallbladder becomes inflamed. Bile duct problems, such as blockage from scarring or kinking. Tumors. Tumors can stop bile from leaving your gallbladder correctly, causing bile to build up. As bile builds up, the gallbladder becomes inflamed. SYMPTOMS  Nausea. Vomiting. Abdominal pain, especially in the upper right area of your abdomen. Abdominal tenderness or bloating. Sweating. Chills. Fever. Yellowing of the skin and the whites of the eyes (jaundice). DIAGNOSIS  Your caregiver may order blood tests to look for infection or gallbladder problems. Your caregiver may also order imaging tests, such as an ultrasound or computed tomography (CT) scan. Further tests may include a hepatobiliary iminodiacetic acid (HIDA) scan. This scan allows your caregiver to see your bile move from the liver to the gallbladder and to the small intestine. TREATMENT  A hospital stay is usually necessary to lessen the inflammation of your gallbladder. You may be required to not eat or drink (fast) for a certain amount of time. You may be given medicine to treat pain or an antibiotic medicine to treat an infection. Surgery may be needed to remove your gallbladder (cholecystectomy) once the inflammation has gone down. Surgery may be needed right away if you develop complications such as death of gallbladder tissue (gangrene) or a tear (perforation) of the gallbladder.  HOME CARE INSTRUCTIONS  Home care will depend on your treatment. In general: If you were given antibiotics, take them as directed. Finish them even if you start to feel better. Only take over-the-counter or prescription medicines for pain, discomfort, or fever as directed by your caregiver. Follow  a low-fat diet until you see your caregiver again. Keep all follow-up visits as directed by your caregiver. SEEK IMMEDIATE MEDICAL CARE IF:  Your pain is increasing and not controlled by medicines. Your pain moves to another part  of your abdomen or to your back. You have a fever. You have nausea and vomiting. MAKE SURE YOU: Understand these instructions. Will watch your condition. Will get help right away if you are not doing well or get worse. Document Released: 08/25/2005 Document Revised: 11/17/2011 Document Reviewed: 07/11/2011 [facility] Patient Information 2014 [place], [place]. Cholecystitis Cholecystitis is an inflammation of your gallbladder. It is usually caused by a buildup of gallstones or sludge (cholelithiasis) in your gallbladder. The gallbladder stores a fluid that helps digest fats (bile). Cholecystitis is serious and needs treatment right away.  CAUSES  Gallstones. Gallstones can block the tube that leads to your gallbladder, causing bile to build up. As bile builds up, the gallbladder becomes inflamed. Bile duct problems, such as blockage from scarring or kinking. Tumors. Tumors can stop bile from leaving your gallbladder correctly, causing bile to build up. As bile builds up, the gallbladder becomes inflamed. SYMPTOMS  Nausea. Vomiting. Abdominal pain, especially in the upper right area of your abdomen. Abdominal tenderness or bloating. Sweating. Chills. Fever. Yellowing of the skin and the whites of the eyes (jaundice). DIAGNOSIS  Your caregiver may order blood tests to look for infection or gallbladder problems. Your caregiver may also order imaging tests, such as an ultrasound or computed tomography (CT) scan. Further tests may include a hepatobiliary iminodiacetic acid (HIDA) scan. This scan allows your caregiver to see your bile move from the liver to the gallbladder and to the small intestine. TREATMENT  A hospital stay is usually necessary to lessen the inflammation of your gallbladder. You may be required to not eat or drink (fast) for a certain amount of time. You may be given medicine to treat pain or an antibiotic medicine to treat an infection. Surgery may be needed to remove your  gallbladder (cholecystectomy) once the inflammation has gone down. Surgery may be needed right away if you develop complications such as death of gallbladder tissue (gangrene) or a tear (perforation) of the gallbladder.  HOME CARE INSTRUCTIONS  Home care will depend on your treatment. In general: If you were given antibiotics, take them as directed. Finish them even if you start to feel better. Only take over-the-counter or prescription medicines for pain, discomfort, or fever as directed by your caregiver. Follow a low-fat diet until you see your caregiver again. Keep all follow-up visits as directed by your caregiver. SEEK IMMEDIATE MEDICAL CARE IF:  Your pain is increasing and not controlled by medicines. Your pain moves to another part of your abdomen or to your back. You have a fever. You have nausea and vomiting. MAKE SURE YOU: Understand these instructions. Will watch your condition. Will get help right away if you are not doing well or get worse. Document Released: 08/25/2005 Document Revised: 11/17/2011 Document Reviewed: 07/11/2011 [facility] Patient Information 2014 [place], [place].

## 2013-02-21 NOTE — Progress Notes (Signed)
Subjective:     Patient ID: Elizabeth [name], female   DOB: 1940-11-30, 72 y.o.   MRN: [id number]  HPI Pt here with several complaints She has noted some trouble with vision She just got her glasses rx changed around 2 weeks ago and notes some problems with the R eye When she puts them on vision is not clear and fells dizzy with some nausea Sx improve with removal of glasses This is complicated by the fact she has had some RUQ fullness with referred pain to the r scapula She at first says meals do not change sx but husbands interjects sx are worse after she eats or has caff drinks No sx at time of appt  Review of Systems  All other systems reviewed and are negative.       Objective:   Physical Exam Pt not wearing glasses PERRLA EOMI Oral- no lesions No bruits/JVD Heart- RRR w/o M Lungs- CTA Abd- soft, NT/ND, no masses/HSM    Assessment:     1. Nausea with vomiting        Plan:     Pt will take her glasses back and see if there is an issue with her new rx Bland diet for now [facility]US at [facility] Will inform of results F/U pending labs

## 2013-02-22 ENCOUNTER — Other Ambulatory Visit: Payer: Self-pay | Admitting: Physician Assistant

## 2013-02-22 ENCOUNTER — Ambulatory Visit (HOSPITAL_COMMUNITY)
Admission: RE | Admit: 2013-02-22 | Discharge: 2013-02-22 | Disposition: A | Discharge status: Home / Self Care | Payer: Medicare Other | Source: Ambulatory Visit | Attending: Physician Assistant | Admitting: Physician Assistant

## 2013-02-22 DIAGNOSIS — K7689 Other specified diseases of liver: Secondary | ICD-10-CM | POA: Insufficient documentation

## 2013-02-22 DIAGNOSIS — R109 Unspecified abdominal pain: Secondary | ICD-10-CM | POA: Insufficient documentation

## 2013-02-22 DIAGNOSIS — R112 Nausea with vomiting, unspecified: Secondary | ICD-10-CM

## 2013-03-10 ENCOUNTER — Other Ambulatory Visit: Payer: Medicare Other

## 2013-03-10 ENCOUNTER — Ambulatory Visit (INDEPENDENT_AMBULATORY_CARE_PROVIDER_SITE_OTHER): Payer: Medicare Other | Admitting: Family Medicine

## 2013-03-10 ENCOUNTER — Encounter: Payer: Self-pay | Admitting: Family Medicine

## 2013-03-10 VITALS — BP 131/69 | HR 78 | Temp 97.6°F | Ht 62.0 in | Wt 197.4 lb

## 2013-03-10 DIAGNOSIS — I639 Cerebral infarction, unspecified: Secondary | ICD-10-CM

## 2013-03-10 DIAGNOSIS — E669 Obesity, unspecified: Secondary | ICD-10-CM

## 2013-03-10 DIAGNOSIS — E119 Type 2 diabetes mellitus without complications: Secondary | ICD-10-CM

## 2013-03-10 DIAGNOSIS — R5383 Other fatigue: Secondary | ICD-10-CM

## 2013-03-10 DIAGNOSIS — I6350 Cerebral infarction due to unspecified occlusion or stenosis of unspecified cerebral artery: Secondary | ICD-10-CM

## 2013-03-10 DIAGNOSIS — E1149 Type 2 diabetes mellitus with other diabetic neurological complication: Secondary | ICD-10-CM

## 2013-03-10 DIAGNOSIS — R635 Abnormal weight gain: Secondary | ICD-10-CM

## 2013-03-10 DIAGNOSIS — E785 Hyperlipidemia, unspecified: Secondary | ICD-10-CM

## 2013-03-10 DIAGNOSIS — E559 Vitamin D deficiency, unspecified: Secondary | ICD-10-CM

## 2013-03-10 DIAGNOSIS — I2510 Atherosclerotic heart disease of native coronary artery without angina pectoris: Secondary | ICD-10-CM

## 2013-03-10 DIAGNOSIS — I10 Essential (primary) hypertension: Secondary | ICD-10-CM

## 2013-03-10 DIAGNOSIS — I7090 Unspecified atherosclerosis: Secondary | ICD-10-CM

## 2013-03-10 DIAGNOSIS — R5381 Other malaise: Secondary | ICD-10-CM

## 2013-03-10 LAB — POCT CBC
Granulocyte percent: 63 %G (ref 37–80)
HCT, POC: 37.5 % — AB (ref 37.7–47.9)
Hemoglobin: 13.2 g/dL (ref 12.2–16.2)
Lymph, poc: 2 (ref 0.6–3.4)
MCH, POC: 28.8 pg (ref 27–31.2)
MCHC: 35.1 g/dL (ref 31.8–35.4)
MCV: 81.9 fL (ref 80–97)
MPV: 6.7 fL (ref 0–99.8)
POC Granulocyte: 4.3 (ref 2–6.9)
POC LYMPH PERCENT: 29.8 %L (ref 10–50)
Platelet Count, POC: 339 10*3/uL (ref 142–424)
RBC: 4.6 M/uL (ref 4.04–5.48)
RDW, POC: 14.4 %
WBC: 6.8 10*3/uL (ref 4.6–10.2)

## 2013-03-10 LAB — THYROID PANEL WITH TSH
Free Thyroxine Index: 2.8 (ref 1.0–3.9)
T3 Uptake: 30.6 % (ref 22.5–37.0)
T4, Total: 9.3 ug/dL (ref 5.0–12.5)
TSH: 2.695 u[IU]/mL (ref 0.350–4.500)

## 2013-03-10 LAB — POCT GLYCOSYLATED HEMOGLOBIN (HGB A1C): Hemoglobin A1C: 6

## 2013-03-10 LAB — HEPATIC FUNCTION PANEL
ALT: 37 U/L — ABNORMAL HIGH (ref 0–35)
AST: 31 U/L (ref 0–37)
Albumin: 4.2 g/dL (ref 3.5–5.2)
Alkaline Phosphatase: 70 U/L (ref 39–117)
Bilirubin, Direct: 0.1 mg/dL (ref 0.0–0.3)
Indirect Bilirubin: 0.4 mg/dL (ref 0.0–0.9)
Total Bilirubin: 0.5 mg/dL (ref 0.3–1.2)
Total Protein: 7 g/dL (ref 6.0–8.3)

## 2013-03-10 LAB — BASIC METABOLIC PANEL WITH GFR
BUN: 8 mg/dL (ref 6–23)
CO2: 29 mEq/L (ref 19–32)
Calcium: 9.9 mg/dL (ref 8.4–10.5)
Chloride: 94 mEq/L — ABNORMAL LOW (ref 96–112)
Creat: 0.75 mg/dL (ref 0.50–1.10)
GFR, Est African American: >89 mL/min
GFR, Est Non African American: 81 mL/min
Glucose, Bld: 94 mg/dL (ref 70–99)
Potassium: 3.8 mEq/L (ref 3.5–5.3)
Sodium: 132 mEq/L — ABNORMAL LOW (ref 135–145)

## 2013-03-10 NOTE — Patient Instructions (Signed)
Continued current medications Continue aggressive therapeutic lifestyle changes Samples were given today, take one half of the Januvia with the called in prescription for metformin 1000, when out of this can go back on the original prescription of 50/1,000 of janumet

## 2013-03-10 NOTE — Progress Notes (Signed)
Pt came in for labs only 

## 2013-03-10 NOTE — Progress Notes (Signed)
  Subjective:    Patient ID: Elizabeth [name], female    DOB: Apr 21, 1941, 72 y.o.   MRN: [id number]  HPI Patient returns to clinic today for followup of chronic medical problems. These include hypertension hyperlipidemia type 2 diabetes mellitus and ASCVD. 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 The only sequela of the CVA seems to be issues slightly with her gait. She has not had any Crestor recently due to its high cost.   Review of Systems  Constitutional: Positive for fatigue.  Eyes: Positive for itching (occassional).  Respiratory: Negative.   Cardiovascular: Positive for leg swelling (occasional).  Gastrointestinal: Negative.   Genitourinary: Positive for frequency (overactive bladder).  Musculoskeletal: Positive for back pain (LBP) and arthralgias (all over).  Skin: Negative.   Allergic/Immunologic: Negative.   Neurological: Positive for dizziness and headaches.  Psychiatric/Behavioral: The patient is nervous/anxious (stable).        Objective:   Physical Exam BP 131/69  Pulse 78  Temp(Src) 97.6 F (36.4 C) (Oral)  Ht 5\' 2"  (1.575 m)  Wt 197 lb 6.4 oz (89.54 kg)  BMI 36.1 kg/m2  The patient appeared well nourished and normally developed, alert and oriented to time and place. Speech, behavior and judgement appear normal. Vital signs as documented.  Head exam is unremarkable. No scleral icterus or pallor noted. A small amount of cerumen in right EAC. Now and throat were normal  Neck is without jugular venous distension, thyromegally, or carotid bruits. Carotid upstrokes are brisk bilaterally. No cervical adenopathy. Lungs are clear anteriorly and posteriorly to auscultation. Normal respiratory effort. Cardiac exam reveals regular rate and rhythm at 60 per minute. First and second heart sounds normal.  No murmurs, rubs or gallops.  Abdominal exam reveals normal bowl sounds, no masses, no  organomegaly and no aortic enlargement. No inguinal adenopathy. Obesity is present. No tenderness. Extremities are nonedematous and both femoral and pedal pulses are normal. Skin without pallor or jaundice.  Warm and very  dry, without rash. Neurologic exam reveals normal deep tendon reflexes and normal sensation. Diabetic foot exam was done          Assessment & Plan:  1. Hyperlipemia - NMR Lipoprofile with Lipids; Standing - Hepatic function panel; Standing - NMR Lipoprofile with Lipids - Hepatic function panel  2. Hypertension - BASIC METABOLIC PANEL WITH GFR; Standing - BASIC METABOLIC PANEL WITH GFR  3. ASCVD (arteriosclerotic cardiovascular disease)  4. Type 2 diabetes mellitus - POCT glycosylated hemoglobin (Hb A1C); Standing - POCT glycosylated hemoglobin (Hb A1C)  5. Fatigue - POCT CBC; Standing - Thyroid Panel With TSH - POCT CBC  6. Vitamin D deficiency - Vitamin D 25 hydroxy; Standing - Vitamin D 25 hydroxy - Vitamin D 25 hydroxy  7. CVA (cerebral vascular accident)  8. Obesity, unspecified   Patient Instructions  Continued current medications Continue aggressive therapeutic lifestyle changes Samples were given today, take one half of the Januvia with the called in prescription for metformin 1000, when out of this can go back on the original prescription of 50/1,000 of janumet

## 2013-03-11 LAB — VITAMIN D 25 HYDROXY (VIT D DEFICIENCY, FRACTURES): Vit D, 25-Hydroxy: 39 ng/mL (ref 30–89)

## 2013-03-14 LAB — NMR LIPOPROFILE WITH LIPIDS
Cholesterol, Total: 288 mg/dL — ABNORMAL HIGH (ref <200)
HDL Particle Number: 26.7 umol/L — ABNORMAL LOW (ref >=30.5)
HDL Size: 8.4 nm — ABNORMAL LOW (ref >=9.2)
HDL-C: 50 mg/dL (ref >=40)
LDL (calc): 206 mg/dL — ABNORMAL HIGH (ref <100)
LDL Particle Number: 2888 nmol/L — ABNORMAL HIGH (ref <1000)
LDL Size: 20.8 nm (ref >20.5)
LP-IR Score: 76 — ABNORMAL HIGH (ref <=45)
Large HDL-P: <1.3 umol/L — ABNORMAL LOW (ref >=4.8)
Large VLDL-P: 4.1 nmol/L — ABNORMAL HIGH (ref <=2.7)
Small LDL Particle Number: 1133 nmol/L — ABNORMAL HIGH (ref <=527)
Triglycerides: 160 mg/dL — ABNORMAL HIGH (ref <150)
VLDL Size: 47.3 nm — ABNORMAL HIGH (ref <=46.6)

## 2013-03-21 ENCOUNTER — Other Ambulatory Visit: Payer: Self-pay | Admitting: *Deleted

## 2013-03-21 MED ORDER — METFORMIN HCL 1000 MG PO TABS: 1000.0000 mg | ORAL_TABLET | Freq: Every day | ORAL | Status: DC

## 2013-03-21 NOTE — Progress Notes (Signed)
Pt notified of lab results rx for Glucophage sent into [facility]

## 2013-03-23 ENCOUNTER — Other Ambulatory Visit: Payer: Self-pay | Admitting: Family Medicine

## 2013-03-25 NOTE — Telephone Encounter (Signed)
LAST FILLED 12/22/12 WITH 2 RF. Have kay call into [facility] [phone]

## 2013-03-28 ENCOUNTER — Ambulatory Visit: Payer: Medicare Other | Admitting: Cardiovascular Disease

## 2013-03-30 NOTE — Telephone Encounter (Signed)
RX called into [facility] Pt notified

## 2013-04-13 ENCOUNTER — Encounter: Payer: Self-pay | Admitting: *Deleted

## 2013-06-24 ENCOUNTER — Telehealth: Payer: Self-pay | Admitting: Family Medicine

## 2013-06-24 NOTE — Telephone Encounter (Signed)
appt changed

## 2013-07-07 ENCOUNTER — Ambulatory Visit (INDEPENDENT_AMBULATORY_CARE_PROVIDER_SITE_OTHER): Payer: Medicare Other | Admitting: Family Medicine

## 2013-07-07 ENCOUNTER — Encounter: Payer: Self-pay | Admitting: Family Medicine

## 2013-07-07 VITALS — BP 135/77 | HR 91 | Temp 96.6°F | Ht 62.0 in | Wt 193.0 lb

## 2013-07-07 DIAGNOSIS — R911 Solitary pulmonary nodule: Secondary | ICD-10-CM

## 2013-07-07 DIAGNOSIS — I10 Essential (primary) hypertension: Secondary | ICD-10-CM

## 2013-07-07 DIAGNOSIS — E559 Vitamin D deficiency, unspecified: Secondary | ICD-10-CM

## 2013-07-07 DIAGNOSIS — E785 Hyperlipidemia, unspecified: Secondary | ICD-10-CM

## 2013-07-07 DIAGNOSIS — E0590 Thyrotoxicosis, unspecified without thyrotoxic crisis or storm: Secondary | ICD-10-CM

## 2013-07-07 DIAGNOSIS — J449 Chronic obstructive pulmonary disease, unspecified: Secondary | ICD-10-CM

## 2013-07-07 DIAGNOSIS — E119 Type 2 diabetes mellitus without complications: Secondary | ICD-10-CM

## 2013-07-07 DIAGNOSIS — Z23 Encounter for immunization: Secondary | ICD-10-CM

## 2013-07-07 DIAGNOSIS — L989 Disorder of the skin and subcutaneous tissue, unspecified: Secondary | ICD-10-CM

## 2013-07-07 DIAGNOSIS — J309 Allergic rhinitis, unspecified: Secondary | ICD-10-CM

## 2013-07-07 DIAGNOSIS — K219 Gastro-esophageal reflux disease without esophagitis: Secondary | ICD-10-CM

## 2013-07-07 LAB — POCT CBC
Granulocyte percent: 72.5 %G (ref 37–80)
HCT, POC: 42.1 % (ref 37.7–47.9)
Hemoglobin: 13.9 g/dL (ref 12.2–16.2)
Lymph, poc: 1.6 (ref 0.6–3.4)
MCH, POC: 26.9 pg — AB (ref 27–31.2)
MCHC: 33.1 g/dL (ref 31.8–35.4)
MCV: 81.2 fL (ref 80–97)
MPV: 7 fL (ref 0–99.8)
POC Granulocyte: 5.3 (ref 2–6.9)
POC LYMPH PERCENT: 22.1 %L (ref 10–50)
Platelet Count, POC: 364 10*3/uL (ref 142–424)
RBC: 5.2 M/uL (ref 4.04–5.48)
RDW, POC: 14.6 %
WBC: 7.3 10*3/uL (ref 4.6–10.2)

## 2013-07-07 LAB — POCT GLYCOSYLATED HEMOGLOBIN (HGB A1C): Hemoglobin A1C: 5.9

## 2013-07-07 NOTE — Patient Instructions (Addendum)
Continue current medications Continue aggressive therapeutic lifestyle changes which include diet and exercise We will call you with lab work once it is available Drink plenty of fluids You will need to get your flu shot and the additional pneumonia shot or Prevnar today Continue to check blood sugars and blood pressures regularly at home Patient understands that she will make an appointment with the dermatologist to look at lesion on her right leg She will schedule a visit with the ophthalmologist, Dr. Luciana Axe She also understands that she needs a colonoscopy in the spring of 2015 and a CT scan in late December 2014

## 2013-07-07 NOTE — Progress Notes (Signed)
Subjective:    Patient ID: Elizabeth [name], female    DOB: Nov 29, 1940, 72 y.o.   MRN: [id number]  HPI Pt here for follow up and management of chronic medical problems.       Patient Active Problem List   Diagnosis Date Noted  . CVA (cerebral vascular accident) 02/27/2012  . Cerebral aneurysm 02/27/2012  . Tobacco abuse 02/27/2012  . Slurred speech 02/27/2012  . 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 Gait instability 02/27/2012  . Vision disturbance S/P CVA (cerebrovascular accident) 02/27/2012  . Dyspnea 11/14/2011  . HYPERTHYROIDISM 10/12/2008  . DM 08/29/2008  . HYPERLIPIDEMIA 08/29/2008  . OBESITY, UNSPECIFIED 08/29/2008  . COPD 08/29/2008  . WEIGHT LOSS 08/29/2008  . DIARRHEA 08/29/2008  . HYPERTENSION 08/28/2008  . ASTHMA 08/28/2008  . ESOPHAGEAL STRICTURE 08/28/2008  . GERD 08/28/2008  . HIATAL HERNIA 08/28/2008  . DIVERTICULOSIS, COLON 08/28/2008  . COLONIC POLYPS, HX OF 08/28/2008   Outpatient Encounter Prescriptions as of 07/07/2013  Medication Sig Dispense Refill  . ALPRAZolam (XANAX) 0.25 MG tablet TAKE 1 TABLET BY MOUTH TWICE A DAY AS NEEDED  60 tablet  2  . amLODipine (NORVASC) 10 MG tablet Take 10 mg by mouth daily.       Marland Kitchen aspirin EC 325 MG tablet Take 325 mg by mouth daily.      . clopidogrel (PLAVIX) 75 MG tablet Take 75 mg by mouth every other day.       . diphenhydrAMINE (BENADRYL) 25 mg capsule Take 25 mg by mouth at bedtime as needed for allergies or sleep.      Marland Kitchen escitalopram (LEXAPRO) 20 MG tablet Take 10 mg by mouth at bedtime.       . fluticasone (FLONASE) 50 MCG/ACT nasal spray Place 1 spray into the nose daily.  16 g  3  . hydrochlorothiazide (MICROZIDE) 12.5 MG capsule Take 12.5 mg by mouth daily.      . metFORMIN (GLUCOPHAGE) 1000 MG tablet Take 1 tablet (1,000 mg total) by mouth daily with breakfast.  90 tablet  4  . metoCLOPramide (REGLAN) 10 MG tablet Take 10 mg by mouth daily.      Marland Kitchen oxybutynin (DITROPAN XL) 15 MG 24 hr tablet Take 15 mg  by mouth every morning.       . pantoprazole (PROTONIX) 40 MG tablet Take 40 mg by mouth daily.      . SitaGLIPtin-MetFORMIN HCl (JANUMET XR) 50-500 MG TB24 Take 1 tablet by mouth daily.      . traMADol (ULTRAM) 50 MG tablet Take 25-50 mg by mouth daily as needed (for body pain or headache).      . Vitamin D, Ergocalciferol, (DRISDOL) 50000 UNITS CAPS TAKE ONE CAPSULE BY MOUTH ONCE WEEKLY  12 capsule  3  . [DISCONTINUED] SitaGLIPtin-MetFORMIN HCl (JANUMET XR) 50-1000 MG TB24 Take 1 tablet by mouth daily.        No facility-administered encounter medications on file as of 07/07/2013.    Review of Systems  Constitutional: Negative.   HENT: Positive for sinus pressure.   Eyes: Negative.   Respiratory: Negative.   Cardiovascular: Negative.   Gastrointestinal: Negative.   Endocrine: Negative.   Genitourinary: Negative.   Musculoskeletal: Negative.   Skin: Negative.   Allergic/Immunologic: Negative.   Neurological: Negative.   Hematological: Negative.   Psychiatric/Behavioral: Negative.        Objective:   Physical Exam  Nursing note and vitals reviewed. Constitutional: She is oriented to person, place, and time. She appears  well-developed and well-nourished.  HENT:  Head: Normocephalic and atraumatic.  Right Ear: External ear normal.  Left Ear: External ear normal.  Mouth/Throat: Oropharynx is clear and moist.  Bilateral nasal congestion  Eyes: Conjunctivae and EOM are normal. Pupils are equal, round, and reactive to light. Right eye exhibits no discharge. Left eye exhibits no discharge. No scleral icterus.  Neck: Normal range of motion. Neck supple. No thyromegaly present.  No carotid bruits  Cardiovascular: Normal rate, regular rhythm, normal heart sounds and intact distal pulses.  Exam reveals no gallop and no friction rub.   No murmur heard. At 72 per minute  Pulmonary/Chest: Effort normal and breath sounds normal. No respiratory distress. She has no wheezes. She has no  rales.  Abdominal: Soft. Bowel sounds are normal. She exhibits no mass. There is no tenderness. There is no rebound and no guarding.  Obesity  Musculoskeletal: Normal range of motion. She exhibits no edema and no tenderness.  Lymphadenopathy:    She has no cervical adenopathy.  Neurological: She is alert and oriented to person, place, and time. She has normal reflexes. No cranial nerve deficit.  Skin: Skin is warm and dry.  Abnormal skin lesion right lateral calf  Psychiatric: She has a normal mood and affect. Her behavior is normal. Judgment and thought content normal.   BP 135/77  Pulse 91  Temp(Src) 96.6 F (35.9 C) (Oral)  Ht 5\' 2"  (1.575 m)  Wt 193 lb (87.544 kg)  BMI 35.29 kg/m2        Assessment & Plan:   1. HYPERLIPIDEMIA   2. COPD   3. DM   4. GERD   5. HYPERTHYROIDISM   6. HYPERTENSION   7. Vitamin D deficiency   8. Lung nodule   9. Skin lesion of right leg   10. Allergic rhinitis    Orders Placed This Encounter  Procedures  . CT Chest Wo Contrast    Standing Status: Future     Number of Occurrences:      Standing Expiration Date: 09/06/2014    Order Specific Question:  Reason for Exam (SYMPTOM  OR DIAGNOSIS REQUIRED)    Answer:  nodlue    Order Specific Question:  Preferred imaging location?    Answer:  [facility]  . Hepatic function panel  . BMP8+EGFR  . NMR, lipoprofile  . Vit D  25 hydroxy (rtn osteoporosis monitoring)  . POCT CBC  . POCT glycosylated hemoglobin (Hb A1C)   Meds ordered this encounter  Medications  . SitaGLIPtin-MetFORMIN HCl (JANUMET XR) 50-500 MG TB24    Sig: Take 1 tablet by mouth daily.   Patient Instructions  Continue current medications Continue aggressive therapeutic lifestyle changes which include diet and exercise We will call you with lab work once it is available Drink plenty of fluids You will need to get your flu shot and the additional pneumonia shot or Prevnar today Continue to check blood sugars and  blood pressures regularly at home Patient understands that she will make an appointment with the dermatologist to look at lesion on her right leg She will schedule a visit with the ophthalmologist, Dr. Luciana Axe She also understands that she needs a colonoscopy in the spring of 2015 and a CT scan in late December 2014   Nyra Capes MD

## 2013-07-09 LAB — BMP8+EGFR
BUN/Creatinine Ratio: 6 — ABNORMAL LOW (ref 11–26)
BUN: 6 mg/dL — ABNORMAL LOW (ref 8–27)
CO2: 26 mmol/L (ref 18–29)
Calcium: 10.5 mg/dL — ABNORMAL HIGH (ref 8.6–10.2)
Chloride: 94 mmol/L — ABNORMAL LOW (ref 97–108)
Creatinine, Ser: 1.02 mg/dL — ABNORMAL HIGH (ref 0.57–1.00)
GFR calc Af Amer: 64 mL/min/{1.73_m2} (ref >59)
GFR calc non Af Amer: 56 mL/min/{1.73_m2} — ABNORMAL LOW (ref >59)
Glucose: 139 mg/dL — ABNORMAL HIGH (ref 65–99)
Potassium: 4.4 mmol/L (ref 3.5–5.2)
Sodium: 137 mmol/L (ref 134–144)

## 2013-07-09 LAB — HEPATIC FUNCTION PANEL
ALT: 76 IU/L — ABNORMAL HIGH (ref 0–32)
AST: 64 IU/L — ABNORMAL HIGH (ref 0–40)
Albumin: 4.7 g/dL (ref 3.5–4.8)
Alkaline Phosphatase: 89 IU/L (ref 39–117)
Bilirubin, Direct: 0.13 mg/dL (ref 0.00–0.40)
Total Bilirubin: 0.5 mg/dL (ref 0.0–1.2)
Total Protein: 7.5 g/dL (ref 6.0–8.5)

## 2013-07-09 LAB — NMR, LIPOPROFILE
Cholesterol: 303 mg/dL — ABNORMAL HIGH (ref <200)
HDL Cholesterol by NMR: 52 mg/dL (ref >=40)
HDL Particle Number: 31.3 umol/L (ref >=30.5)
LDL Particle Number: 3106 nmol/L — ABNORMAL HIGH (ref <1000)
LDL Size: 21 nm (ref >20.5)
LDLC SERPL CALC-MCNC: 213 mg/dL — ABNORMAL HIGH (ref <100)
LP-IR Score: 65 — ABNORMAL HIGH (ref <=45)
Small LDL Particle Number: 1475 nmol/L — ABNORMAL HIGH (ref <=527)
Triglycerides by NMR: 189 mg/dL — ABNORMAL HIGH (ref <150)

## 2013-07-09 LAB — VITAMIN D 25 HYDROXY (VIT D DEFICIENCY, FRACTURES): Vit D, 25-Hydroxy: 29.3 ng/mL — ABNORMAL LOW (ref 30.0–100.0)

## 2013-07-26 ENCOUNTER — Ambulatory Visit (INDEPENDENT_AMBULATORY_CARE_PROVIDER_SITE_OTHER): Payer: Medicare Other | Admitting: Cardiology

## 2013-07-26 ENCOUNTER — Encounter: Payer: Self-pay | Admitting: Cardiology

## 2013-07-26 VITALS — BP 137/82 | HR 93 | Ht 62.0 in | Wt 193.0 lb

## 2013-07-26 DIAGNOSIS — I10 Essential (primary) hypertension: Secondary | ICD-10-CM

## 2013-07-26 NOTE — Progress Notes (Signed)
Clinical Summary Ms. Soler is a 72 y.o.female last seen by Dr Kirke Corin, this is her first visit with me. She presents for the following medical problems.   1. CAD - prior cath showed mild non-obstructive disease in 2006 - denies any chest pain. No SOB or DOE. Fairly sedentary lifestyle, denies any significant symptoms at her level of her exertion - compliant with meds: ASA 325 (her preference is high dose), plavix (on for stroke by neurologist), pitavastatin. She has allergies to ARBs and ACE-I.   2. HTN - checks bp at home weekly. Typically around 110s/60s - compliant with meds, not on ACE or ARB due to allergy  3. Hyperlipidemia - currently on pitavastatin, previously on crestor but had muscle aches. Managed by Dr Christell Constant her PCP - 06/2013 HDL 52 TG 189 TC 303 LDL 213   Past Medical History  Diagnosis Date  . Diarrhea   . Obesity, unspecified   . Other and unspecified hyperlipidemia   . Unspecified essential hypertension   . Family history of malignant neoplasm of gastrointestinal tract   . Esophageal reflux   . Stricture and stenosis of esophagus   . Diverticulosis of colon (without mention of hemorrhage)   . Personal history of colonic polyps   . Active smoker   . Unspecified asthma(493.90)   . Type II or unspecified type diabetes mellitus without mention of complication, not stated as uncontrolled   . Chronic airway obstruction, not elsewhere classified   . Headache(784.0)     Irregular  . Anxiety     Takes Xanax for anxiety     Allergies  Allergen Reactions  . Banana Anaphylaxis  . Actos [Pioglitazone] Swelling  . Benicar [Olmesartan Medoxomil] Other (See Comments)    Feel bad  . Chantix [Varenicline] Other (See Comments)    "Worked opposite."  . Codeine Nausea And Vomiting  . Diovan [Valsartan] Other (See Comments)    Feel bad  . Latex     REACTION: red rash  . Lipitor [Atorvastatin] Other (See Comments)    REACTION:  Joint pain  . Spinach     Stomach  problems  . Vicodin [Hydrocodone-Acetaminophen] Nausea And Vomiting  . Zocor [Simvastatin] Other (See Comments)    REACTION:  "made liver function incorrectly"  . Ace Inhibitors Cough  . Celebrex [Celecoxib] Rash  . Penicillins Rash  . Sulfonamide Derivatives Rash     Current Outpatient Prescriptions  Medication Sig Dispense Refill  . ALPRAZolam (XANAX) 0.25 MG tablet TAKE 1 TABLET BY MOUTH TWICE A DAY AS NEEDED  60 tablet  2  . amLODipine (NORVASC) 10 MG tablet Take 10 mg by mouth daily.       Marland Kitchen aspirin EC 325 MG tablet Take 325 mg by mouth daily.      . clopidogrel (PLAVIX) 75 MG tablet Take 75 mg by mouth every other day.       . diphenhydrAMINE (BENADRYL) 25 mg capsule Take 25 mg by mouth at bedtime as needed for allergies or sleep.      Marland Kitchen escitalopram (LEXAPRO) 20 MG tablet Take 10 mg by mouth at bedtime.       . fluticasone (FLONASE) 50 MCG/ACT nasal spray Place 1 spray into the nose daily.  16 g  3  . hydrochlorothiazide (MICROZIDE) 12.5 MG capsule Take 12.5 mg by mouth daily.      . metFORMIN (GLUCOPHAGE) 1000 MG tablet Take 1 tablet (1,000 mg total) by mouth daily with breakfast.  90 tablet  4  . metoCLOPramide (REGLAN) 10 MG tablet Take 10 mg by mouth daily.      Marland Kitchen oxybutynin (DITROPAN XL) 15 MG 24 hr tablet Take 15 mg by mouth every morning.       . pantoprazole (PROTONIX) 40 MG tablet Take 40 mg by mouth daily.      . SitaGLIPtin-MetFORMIN HCl (JANUMET XR) 50-500 MG TB24 Take 1 tablet by mouth daily.      . traMADol (ULTRAM) 50 MG tablet Take 25-50 mg by mouth daily as needed (for body pain or headache).      . Vitamin D, Ergocalciferol, (DRISDOL) 50000 UNITS CAPS TAKE ONE CAPSULE BY MOUTH ONCE WEEKLY  12 capsule  3  . [DISCONTINUED] ramipril (ALTACE) 10 MG capsule Take 10 mg by mouth daily.         No current facility-administered medications for this visit.     Past Surgical History  Procedure Laterality Date  . Total abdominal hysterectomy    . Tonsillectomy    .  Lumbar disc surgery    . Knee arthroscopy      left  . Appendectomy    . Cardiac catheterization  2006    LAD: 30%, RCA : 20%, normal EF, elevated LVEDP  . Cardiac catheterization    . Back surgery      Spinal surgery     Allergies  Allergen Reactions  . Banana Anaphylaxis  . Actos [Pioglitazone] Swelling  . Benicar [Olmesartan Medoxomil] Other (See Comments)    Feel bad  . Chantix [Varenicline] Other (See Comments)    "Worked opposite."  . Codeine Nausea And Vomiting  . Diovan [Valsartan] Other (See Comments)    Feel bad  . Latex     REACTION: red rash  . Lipitor [Atorvastatin] Other (See Comments)    REACTION:  Joint pain  . Spinach     Stomach problems  . Vicodin [Hydrocodone-Acetaminophen] Nausea And Vomiting  . Zocor [Simvastatin] Other (See Comments)    REACTION:  "made liver function incorrectly"  . Ace Inhibitors Cough  . Celebrex [Celecoxib] Rash  . Penicillins Rash  . Sulfonamide Derivatives Rash      Family History  Problem Relation Age of Onset  . Colon cancer Other     first cousin, paternal aunt and uncle  . Cancer Paternal Grandmother     gastric  . Breast cancer Maternal Aunt     several paternal cousins  . Diabetes Mother   . Heart disease Mother   . Heart disease Maternal Grandmother   . Thyroid cancer Other     aunt  . Thyroid disease Sister     uncertain type-not cancer     Social History Ms. Kratz reports that she quit smoking about 16 months ago. Her smoking use included Cigarettes. She smoked 0.00 packs per day for 20 years. She has never used smokeless tobacco. Ms. Blizard reports that she does not drink alcohol.   Review of Systems CONSTITUTIONAL: No weight loss, fever, chills, weakness or fatigue.  HEENT: Eyes: No visual loss, blurred vision, double vision or yellow sclerae.No hearing loss, sneezing, congestion, runny nose or sore throat.  SKIN: No rash or itching.  CARDIOVASCULAR: per HPI RESPIRATORY: per HPI    GASTROINTESTINAL: No anorexia, nausea, vomiting or diarrhea. No abdominal pain or blood.  GENITOURINARY: No burning on urination, no polyuria NEUROLOGICAL: No headache, dizziness, syncope, paralysis, ataxia, numbness or tingling in the extremities. No change in bowel or bladder control.  MUSCULOSKELETAL: No muscle, back  pain, joint pain or stiffness.  LYMPHATICS: No enlarged nodes. No history of splenectomy.  PSYCHIATRIC: No history of depression or anxiety.  ENDOCRINOLOGIC: No reports of sweating, cold or heat intolerance. No polyuria or polydipsia.  Marland Kitchen   Physical Examination p 93 bp 137/82 Wt 193 lbs BMI 35 Gen: resting comfortably, no acute distress HEENT: no scleral icterus, pupils equal round and reactive, no palptable cervical adenopathy,  CV: RRR, no m/r/g, no JVD, no carotid bruits Resp: Clear to auscultation bilaterally GI: abdomen is soft, non-tender, non-distended, normal bowel sounds, no hepatosplenomegaly MSK: extremities are warm, no edema.  Skin: warm, no rash Neuro:  no focal deficits Psych: appropriate affect   Diagnostic Studies 02/2012 Echo: LVEF 60-65%, mild LVH, no WMAs, grade I diastolic dysfunction,   12/2004 Cath DATE OF PROCEDURE: 12/26/2004  DATE OF DISCHARGE: 12/26/2004  CARDIAC CATHETERIZATION  INDICATIONS: Ms. Siddall is a pleasant 72 year old woman with a history of  hypertension, dyslipidemia, type 2 diabetes mellitus, tobacco use, and  asthma. She has had recent problems with mild dyspnea and lower extremity  edema. Given her cardiac risk factor profile, she is referred for diagnostic  coronary angiography and left ventriculography to assess coronary anatomy  and left ventricular function.  PROCEDURE:  1. Left heart catheterization.  2. Selective coronary angiography.  3. Left ventriculography.  DESCRIPTION OF PROCEDURE: The area about the right femoral artery was  anesthetized 1% lidocaine and a 4-French sheath was placed in the right  femoral  artery via modified Seldinger technique. Standard preformed 4-French  JL-4 and JR-4 catheters used for selective coronary angiography and angled  pigtail catheters used for left heart catheterization and left  ventriculography. All exchanges were made over wire. The patient tolerated  procedure well without immediate complications.  HEMODYNAMIC RESULTS:  1. Left ventricle 151/25 mmHg.  2. Aorta 152/76 mmHg.  ANGIOGRAPHIC FINDINGS:  1. Left main coronary artery is free of significant flow-limiting coronary  atherosclerosis.  2. The left anterior descending is a medium caliber vessel with three small  diagonal branches. There are minor Luminal irregularities noted  throughout this system with approximately 30% diffuse mid-vessel  stenosis. There is also a 30-40% ostial stenosis involving the first  diagonal branch. No flow-limiting stenoses are noted.  3. The circumflex coronary artery is a medium caliber vessel. There is a  very small branch in the AV groove and two larger bifurcating obtuse  marginal branches. Minor Luminal irregularities are noted without flow-  limiting stenoses.  4. The right coronary artery is a medium caliber vessel with posterior  descending branch. Minor Luminal irregularities are noted. There is a  20% stenosis in the proximal to mid-vessel.  LEFT VENTRICULOGRAPHY: Left ventriculography was performed in the RAO  projection revealing an ejection fraction approximately 65% in the setting  of ventricular ectopy without significant mitral regurgitation or focal wall  motion abnormality.  DIAGNOSES:  1. Mild coronary atherosclerosis as outlined with no flow-limiting stenoses  in the major epicardial vessels.  2. Left ventricular ejection fraction of approximately 65% in the setting  of ventricular ectopy. No significant mitral regurgitation is noted. The  left ventricular end-diastolic pressure is increased at 25 mmHg. At this  point would suspect an element of  diastolic dysfunction particularly  given elevated left renal end-diastolic pressure.  The patient is already on an ACE inhibitor and diuretic. We will plan to  continue maximizing medical therapy. We will arrange a follow-up 2-D  echocardiogram for diastolic parameters and also to exclude  significantly elevated pulmonary pressures. She will have follow-up in  the office to review this and her progress.  07/26/13 Clinic EKG: sinus rhythm, normal axis, RBBB  Assessment and Plan  1. CAD - non-obstructive CAD by prior cath, continue risk factor modification  2. HTN - elevated in clinic, home values are at goal. Continue current medications, she is not on ARB or ACE due to allergy.   3. Hyperlipidemia - managed by her PCP, recent myalgias on crestor, changed to alternative statin. She will follow with her PCP, goal LDL< 70.       Antoine Poche, M.D., F.A.C.C.

## 2013-07-26 NOTE — Patient Instructions (Signed)
Your physician recommends that you schedule a follow-up appointment in: 1 year with Dr. Branch. You should receive a letter in the mail in 10 months. If you do not receive this letter by September 2015 call our office to schedule this appointment.   Your physician recommends that you continue on your current medications as directed. Please refer to the Current Medication list given to you today.  

## 2013-08-01 ENCOUNTER — Ambulatory Visit (HOSPITAL_COMMUNITY)
Admission: RE | Admit: 2013-08-01 | Discharge: 2013-08-01 | Disposition: A | Discharge status: Home / Self Care | Payer: Medicare Other | Source: Ambulatory Visit | Attending: Family Medicine | Admitting: Family Medicine

## 2013-08-01 DIAGNOSIS — Q619 Cystic kidney disease, unspecified: Secondary | ICD-10-CM | POA: Insufficient documentation

## 2013-08-01 DIAGNOSIS — R911 Solitary pulmonary nodule: Secondary | ICD-10-CM

## 2013-08-01 DIAGNOSIS — Z09 Encounter for follow-up examination after completed treatment for conditions other than malignant neoplasm: Secondary | ICD-10-CM | POA: Insufficient documentation

## 2013-08-09 ENCOUNTER — Other Ambulatory Visit: Payer: Self-pay | Admitting: *Deleted

## 2013-08-09 ENCOUNTER — Other Ambulatory Visit (INDEPENDENT_AMBULATORY_CARE_PROVIDER_SITE_OTHER): Payer: Medicare Other

## 2013-08-09 DIAGNOSIS — E559 Vitamin D deficiency, unspecified: Secondary | ICD-10-CM

## 2013-08-09 DIAGNOSIS — R5383 Other fatigue: Secondary | ICD-10-CM

## 2013-08-09 DIAGNOSIS — E785 Hyperlipidemia, unspecified: Secondary | ICD-10-CM

## 2013-08-09 DIAGNOSIS — E119 Type 2 diabetes mellitus without complications: Secondary | ICD-10-CM

## 2013-08-09 DIAGNOSIS — I10 Essential (primary) hypertension: Secondary | ICD-10-CM

## 2013-08-09 DIAGNOSIS — I639 Cerebral infarction, unspecified: Secondary | ICD-10-CM

## 2013-08-09 DIAGNOSIS — R5381 Other malaise: Secondary | ICD-10-CM

## 2013-08-09 LAB — HEPATIC FUNCTION PANEL
ALT: 84 U/L — ABNORMAL HIGH (ref 0–35)
AST: 60 U/L — ABNORMAL HIGH (ref 0–37)
Albumin: 4.4 g/dL (ref 3.5–5.2)
Alkaline Phosphatase: 81 U/L (ref 39–117)
Bilirubin, Direct: 0.1 mg/dL (ref 0.0–0.3)
Indirect Bilirubin: 0.4 mg/dL (ref 0.0–0.9)
Total Bilirubin: 0.5 mg/dL (ref 0.3–1.2)
Total Protein: 7.7 g/dL (ref 6.0–8.3)

## 2013-08-09 LAB — BASIC METABOLIC PANEL WITH GFR
BUN: 6 mg/dL (ref 6–23)
CO2: 30 mEq/L (ref 19–32)
Calcium: 10 mg/dL (ref 8.4–10.5)
Chloride: 94 mEq/L — ABNORMAL LOW (ref 96–112)
Creat: 0.71 mg/dL (ref 0.50–1.10)
GFR, Est African American: >89 mL/min
GFR, Est Non African American: 87 mL/min
Glucose, Bld: 123 mg/dL — ABNORMAL HIGH (ref 70–99)
Potassium: 3.5 mEq/L (ref 3.5–5.3)
Sodium: 134 mEq/L — ABNORMAL LOW (ref 135–145)

## 2013-08-09 LAB — POCT GLYCOSYLATED HEMOGLOBIN (HGB A1C): Hemoglobin A1C: 6.2

## 2013-08-10 ENCOUNTER — Telehealth: Payer: Self-pay | Admitting: *Deleted

## 2013-08-10 LAB — CBC WITH DIFFERENTIAL
Basophils Absolute: 0 10*3/uL (ref 0.0–0.2)
Basos: 1 %
Eos: 2 %
Eosinophils Absolute: 0.1 10*3/uL (ref 0.0–0.4)
HCT: 40 % (ref 34.0–46.6)
Hemoglobin: 13.8 g/dL (ref 11.1–15.9)
Immature Grans (Abs): 0 10*3/uL (ref 0.0–0.1)
Immature Granulocytes: 0 %
Lymphocytes Absolute: 1.7 10*3/uL (ref 0.7–3.1)
Lymphs: 23 %
MCH: 28 pg (ref 26.6–33.0)
MCHC: 34.5 g/dL (ref 31.5–35.7)
MCV: 81 fL (ref 79–97)
Monocytes Absolute: 0.4 10*3/uL (ref 0.1–0.9)
Monocytes: 5 %
Neutrophils Absolute: 5.1 10*3/uL (ref 1.4–7.0)
Neutrophils Relative %: 69 %
Platelets: 352 10*3/uL (ref 150–379)
RBC: 4.93 x10E6/uL (ref 3.77–5.28)
RDW: 14.2 % (ref 12.3–15.4)
WBC: 7.5 10*3/uL (ref 3.4–10.8)

## 2013-08-10 LAB — NMR LIPOPROFILE WITH LIPIDS
Cholesterol, Total: 242 mg/dL — ABNORMAL HIGH (ref <200)
HDL Particle Number: 31.9 umol/L (ref >=30.5)
HDL Size: 9.1 nm — ABNORMAL LOW (ref >=9.2)
HDL-C: 53 mg/dL (ref >=40)
LDL (calc): 171 mg/dL — ABNORMAL HIGH (ref <100)
LDL Particle Number: 2063 nmol/L — ABNORMAL HIGH (ref <1000)
LDL Size: 21.2 nm (ref >20.5)
LP-IR Score: 36 (ref <=45)
Large HDL-P: 6.9 umol/L (ref >=4.8)
Large VLDL-P: 3.7 nmol/L — ABNORMAL HIGH (ref <=2.7)
Small LDL Particle Number: 637 nmol/L — ABNORMAL HIGH (ref <=527)
Triglycerides: 92 mg/dL (ref <150)
VLDL Size: 39.4 nm (ref <=46.6)

## 2013-08-10 LAB — VITAMIN D 25 HYDROXY (VIT D DEFICIENCY, FRACTURES): Vit D, 25-Hydroxy: 52 ng/mL (ref 30–89)

## 2013-08-10 NOTE — Telephone Encounter (Signed)
Pt.notified

## 2013-08-10 NOTE — Telephone Encounter (Signed)
Message copied by Baltazar Apo on Wed Aug 10, 2013 10:45 AM ------      Message from: Ernestina Penna      Created: Tue Aug 09, 2013  5:34 PM       The hemoglobin A1c is 6.2% and this indicates good blood sugar control ------

## 2013-08-25 ENCOUNTER — Ambulatory Visit: Payer: Medicare Other | Admitting: Family Medicine

## 2013-09-23 ENCOUNTER — Other Ambulatory Visit (HOSPITAL_COMMUNITY): Payer: Self-pay | Admitting: Interventional Radiology

## 2013-09-23 DIAGNOSIS — I729 Aneurysm of unspecified site: Secondary | ICD-10-CM

## 2013-10-03 ENCOUNTER — Telehealth: Payer: Self-pay | Admitting: Family Medicine

## 2013-10-03 MED ORDER — SITAGLIP PHOS-METFORMIN HCL ER 50-500 MG PO TB24: 1.0000 | ORAL_TABLET | Freq: Every day | ORAL | Status: DC

## 2013-10-03 NOTE — Telephone Encounter (Signed)
done

## 2013-10-10 ENCOUNTER — Telehealth: Payer: Self-pay | Admitting: Family Medicine

## 2013-10-10 ENCOUNTER — Telehealth: Payer: Self-pay | Admitting: *Deleted

## 2013-10-10 NOTE — Telephone Encounter (Signed)
I called Elizabeth [name], her med list says Janumet, but she is calling stating that she has metformin and only needs Januvia, please order

## 2013-10-12 MED ORDER — SITAGLIPTIN PHOSPHATE 50 MG PO TABS: 50.0000 mg | ORAL_TABLET | Freq: Every day | ORAL | Status: DC

## 2013-10-18 ENCOUNTER — Ambulatory Visit (INDEPENDENT_AMBULATORY_CARE_PROVIDER_SITE_OTHER): Payer: Medicare Other | Admitting: Family Medicine

## 2013-10-18 ENCOUNTER — Encounter (INDEPENDENT_AMBULATORY_CARE_PROVIDER_SITE_OTHER): Payer: Self-pay

## 2013-10-18 ENCOUNTER — Encounter: Payer: Self-pay | Admitting: Family Medicine

## 2013-10-18 VITALS — BP 121/65 | HR 78 | Temp 97.9°F | Ht 62.0 in | Wt 196.0 lb

## 2013-10-18 DIAGNOSIS — I10 Essential (primary) hypertension: Secondary | ICD-10-CM

## 2013-10-18 DIAGNOSIS — E0590 Thyrotoxicosis, unspecified without thyrotoxic crisis or storm: Secondary | ICD-10-CM

## 2013-10-18 DIAGNOSIS — R7989 Other specified abnormal findings of blood chemistry: Secondary | ICD-10-CM

## 2013-10-18 DIAGNOSIS — J449 Chronic obstructive pulmonary disease, unspecified: Secondary | ICD-10-CM

## 2013-10-18 DIAGNOSIS — K219 Gastro-esophageal reflux disease without esophagitis: Secondary | ICD-10-CM

## 2013-10-18 DIAGNOSIS — R945 Abnormal results of liver function studies: Secondary | ICD-10-CM

## 2013-10-18 DIAGNOSIS — E785 Hyperlipidemia, unspecified: Secondary | ICD-10-CM

## 2013-10-18 DIAGNOSIS — E119 Type 2 diabetes mellitus without complications: Secondary | ICD-10-CM

## 2013-10-18 DIAGNOSIS — J45909 Unspecified asthma, uncomplicated: Secondary | ICD-10-CM

## 2013-10-18 DIAGNOSIS — I639 Cerebral infarction, unspecified: Secondary | ICD-10-CM

## 2013-10-18 DIAGNOSIS — I6350 Cerebral infarction due to unspecified occlusion or stenosis of unspecified cerebral artery: Secondary | ICD-10-CM

## 2013-10-18 MED ORDER — TRAMADOL HCL 50 MG PO TABS: 25.0000 mg | ORAL_TABLET | Freq: Every day | ORAL | Status: DC | PRN

## 2013-10-18 NOTE — Patient Instructions (Addendum)
Continue current medications. Continue good therapeutic lifestyle changes which include good diet and exercise. Fall precautions discussed with patient. Schedule your flu vaccine if you haven't had it yet If you are over 71 years old - you may need Prevnar 77 or the adult Pneumonia vaccine. Continue take medications as directed Followup as aggressive therapeutic lifestyle changes as possible Continued followup with the neurosurgeon and neurologist for the cerebral aneurysm

## 2013-10-18 NOTE — Progress Notes (Signed)
Subjective:    Patient ID: Elizabeth [name], female    DOB: [birth year], 73 y.o.   MRN: [id number]  HPI Pt here for follow up and management of chronic medical problems. The patient has a history of multiple medical problems in the is apparently doing fairly well and stable in regard to these problems. She is due for Prevnar vaccine today, we will review her lab work today with her also. Patient does complain of a lump in her right deltoid muscle. She does not recall any injury to this area.        Patient Active Problem List   Diagnosis Date Noted  . CVA (cerebral vascular accident) 02/27/2012  . Cerebral aneurysm 02/27/2012  . 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 Gait instability 02/27/2012  . Vision disturbance S/P CVA (cerebrovascular accident) 02/27/2012  . Dyspnea 11/14/2011  . HYPERTHYROIDISM 10/12/2008  . DM 08/29/2008  . HYPERLIPIDEMIA 08/29/2008  . OBESITY, UNSPECIFIED 08/29/2008  . COPD 08/29/2008  . WEIGHT LOSS 08/29/2008  . DIARRHEA 08/29/2008  . HYPERTENSION 08/28/2008  . ASTHMA 08/28/2008  . ESOPHAGEAL STRICTURE 08/28/2008  . GERD 08/28/2008  . HIATAL HERNIA 08/28/2008  . DIVERTICULOSIS, COLON 08/28/2008  . COLONIC POLYPS, HX OF 08/28/2008   Outpatient Encounter Prescriptions as of 10/18/2013  Medication Sig  . ALPRAZolam (XANAX) 0.25 MG tablet TAKE 1 TABLET BY MOUTH TWICE A DAY AS NEEDED  . amLODipine (NORVASC) 10 MG tablet Take 10 mg by mouth daily.   Marland Kitchen aspirin EC 325 MG tablet Take 325 mg by mouth daily.  Marland Kitchen azelastine (ASTELIN) 137 MCG/SPRAY nasal spray Place 1 spray into both nostrils 2 (two) times daily. Use in each nostril as directed  . clopidogrel (PLAVIX) 75 MG tablet Take 75 mg by mouth every other day.   . fluticasone (FLONASE) 50 MCG/ACT nasal spray Place 1 spray into the nose daily.  . hydrochlorothiazide (MICROZIDE) 12.5 MG capsule Take 12.5 mg by mouth daily.  . metFORMIN (GLUCOPHAGE) 1000 MG tablet Take 1 tablet (1,000 mg total) by mouth  daily with breakfast.  . metoCLOPramide (REGLAN) 10 MG tablet Take 10 mg by mouth daily.  Marland Kitchen oxybutynin (DITROPAN XL) 15 MG 24 hr tablet Take 15 mg by mouth every morning.   . pantoprazole (PROTONIX) 40 MG tablet Take 40 mg by mouth daily.  . sitaGLIPtin (JANUVIA) 50 MG tablet Take 1 tablet (50 mg total) by mouth daily.  . traMADol (ULTRAM) 50 MG tablet Take 25-50 mg by mouth daily as needed (for body pain or headache).  . Vitamin D, Ergocalciferol, (DRISDOL) 50000 UNITS CAPS TAKE ONE CAPSULE BY MOUTH ONCE WEEKLY  . [DISCONTINUED] escitalopram (LEXAPRO) 20 MG tablet Take 10 mg by mouth at bedtime.   . [DISCONTINUED] SitaGLIPtin-MetFORMIN HCl (JANUMET XR) 50-500 MG TB24 Take 1 tablet by mouth daily.  . Pitavastatin Calcium (LIVALO) 2 MG TABS Take 1 tablet by mouth every other day.  . [DISCONTINUED] diphenhydrAMINE (BENADRYL) 25 mg capsule Take 25 mg by mouth at bedtime as needed for allergies or sleep.    Review of Systems  Constitutional: Negative.   HENT: Negative.   Eyes: Negative.   Respiratory: Negative.   Cardiovascular: Negative.   Gastrointestinal: Negative.   Endocrine: Negative.   Genitourinary: Negative.   Musculoskeletal: Positive for myalgias (right upper arm- mucsle tightness/knot).  Skin: Negative.   Allergic/Immunologic: Negative.   Neurological: Negative.   Hematological: Negative.        Objective:   Physical Exam  Nursing note and vitals  reviewed. Constitutional: She is oriented to person, place, and time. She appears well-developed and well-nourished. No distress.  Alert and pleasant  HENT:  Head: Normocephalic and atraumatic.  Right Ear: External ear normal.  Left Ear: External ear normal.  Nose: Nose normal.  Mouth/Throat: Oropharynx is clear and moist.  Eyes: Conjunctivae and EOM are normal. Pupils are equal, round, and reactive to light. Right eye exhibits no discharge. Left eye exhibits no discharge. No scleral icterus.  Neck: Normal range of motion.  Neck supple. No thyromegaly present.  No carotid bruits audible  Cardiovascular: Normal rate, regular rhythm, normal heart sounds and intact distal pulses.  Exam reveals no gallop and no friction rub.   No murmur heard. Regular rate and rhythm at 72 per minute  Pulmonary/Chest: Effort normal and breath sounds normal. No respiratory distress. She has no wheezes. She has no rales. She exhibits no tenderness.  Abdominal: Soft. Bowel sounds are normal. She exhibits no distension and no mass. There is no tenderness. There is no rebound and no guarding.  Obesity present no epigastric tenderness  Musculoskeletal: Normal range of motion. She exhibits no edema and no tenderness.  Lymphadenopathy:    She has no cervical adenopathy.  Neurological: She is alert and oriented to person, place, and time. She has normal reflexes. No cranial nerve deficit.  Skin: Skin is warm and dry.  Psychiatric: She has a normal mood and affect. Her behavior is normal. Judgment and thought content normal.   BP 121/65  Pulse 78  Temp(Src) 97.9 F (36.6 C) (Oral)  Ht 5\' 2"  (1.575 m)  Wt 196 lb (88.905 kg)  BMI 35.84 kg/m2        Assessment & Plan:  1. ASTHMA  2. COPD  3. DM  4. GERD  5. HYPERLIPIDEMIA  6. HYPERTENSION  7. HYPERTHYROIDISM  8. Elevated liver function tests - Hepatic function panel  9. CVA (cerebral infarction) Patient Instructions  Continue current medications. Continue good therapeutic lifestyle changes which include good diet and exercise. Fall precautions discussed with patient. Schedule your flu vaccine if you haven't had it yet If you are over 79 years old - you may need Prevnar 27 or the adult Pneumonia vaccine. Continue take medications as directed Followup as aggressive therapeutic lifestyle changes as possible Continued followup with the neurosurgeon and neurologist for the cerebral aneurysm   Arrie Senate MD

## 2013-10-18 NOTE — Telephone Encounter (Signed)
error 

## 2013-10-24 ENCOUNTER — Other Ambulatory Visit: Payer: Self-pay | Admitting: Family Medicine

## 2013-10-24 DIAGNOSIS — Z1231 Encounter for screening mammogram for malignant neoplasm of breast: Secondary | ICD-10-CM

## 2013-10-31 ENCOUNTER — Other Ambulatory Visit (HOSPITAL_COMMUNITY): Payer: Self-pay | Admitting: Interventional Radiology

## 2013-10-31 DIAGNOSIS — I729 Aneurysm of unspecified site: Secondary | ICD-10-CM

## 2013-11-01 ENCOUNTER — Ambulatory Visit (HOSPITAL_COMMUNITY): Admission: RE | Admit: 2013-11-01 | Payer: Medicare Other | Source: Ambulatory Visit

## 2013-11-03 ENCOUNTER — Ambulatory Visit (HOSPITAL_COMMUNITY): Payer: Medicare Other

## 2013-11-04 ENCOUNTER — Ambulatory Visit (HOSPITAL_COMMUNITY)
Admission: RE | Admit: 2013-11-04 | Discharge: 2013-11-04 | Disposition: A | Discharge status: Home / Self Care | Payer: Medicare Other | Source: Ambulatory Visit | Attending: Interventional Radiology | Admitting: Interventional Radiology

## 2013-11-04 DIAGNOSIS — G319 Degenerative disease of nervous system, unspecified: Secondary | ICD-10-CM | POA: Insufficient documentation

## 2013-11-04 DIAGNOSIS — I6789 Other cerebrovascular disease: Secondary | ICD-10-CM | POA: Insufficient documentation

## 2013-11-04 DIAGNOSIS — I729 Aneurysm of unspecified site: Secondary | ICD-10-CM

## 2013-11-04 DIAGNOSIS — I671 Cerebral aneurysm, nonruptured: Secondary | ICD-10-CM | POA: Insufficient documentation

## 2013-11-04 LAB — CREATININE, SERUM
Creatinine, Ser: 0.78 mg/dL (ref 0.50–1.10)
GFR calc Af Amer: >90 mL/min (ref >90)
GFR calc non Af Amer: 83 mL/min — ABNORMAL LOW (ref >90)

## 2013-11-04 MED ORDER — GADOBENATE DIMEGLUMINE 529 MG/ML IV SOLN
20.0000 mL | Freq: Once | INTRAVENOUS | Status: AC
Start: 2013-11-04 — End: 2013-11-04
  Administered 2013-11-04: 20 mL via INTRAVENOUS

## 2013-11-08 ENCOUNTER — Telehealth (HOSPITAL_COMMUNITY): Payer: Self-pay | Admitting: Interventional Radiology

## 2013-11-08 NOTE — Telephone Encounter (Signed)
Called pt and spoke to her husband, told him MRI was stable and we would f/u again in 1 yrs time. He states understanding. JM

## 2013-11-14 ENCOUNTER — Telehealth: Payer: Self-pay | Admitting: *Deleted

## 2013-11-14 NOTE — Telephone Encounter (Signed)
Pt wanted jamie b to call- lmtcb -jhb

## 2013-11-25 ENCOUNTER — Ambulatory Visit (INDEPENDENT_AMBULATORY_CARE_PROVIDER_SITE_OTHER): Payer: Medicare Other | Admitting: *Deleted

## 2013-11-25 DIAGNOSIS — Z23 Encounter for immunization: Secondary | ICD-10-CM

## 2013-11-25 NOTE — Progress Notes (Signed)
Tolerated well

## 2013-11-25 NOTE — Patient Instructions (Signed)

## 2013-12-05 ENCOUNTER — Other Ambulatory Visit: Payer: Self-pay | Admitting: Family Medicine

## 2013-12-06 NOTE — Telephone Encounter (Signed)
Last ov 10/18/13. Last refill 09/26/13. Route to pool to call in [facility]

## 2013-12-06 NOTE — Telephone Encounter (Signed)
Called in.

## 2013-12-06 NOTE — Telephone Encounter (Signed)
This is okay to refill 

## 2013-12-07 ENCOUNTER — Telehealth: Payer: Self-pay | Admitting: Family Medicine

## 2013-12-07 NOTE — Telephone Encounter (Signed)
Ppt made.  

## 2013-12-12 ENCOUNTER — Ambulatory Visit (INDEPENDENT_AMBULATORY_CARE_PROVIDER_SITE_OTHER): Payer: Medicare Other | Admitting: Family Medicine

## 2013-12-12 ENCOUNTER — Ambulatory Visit (INDEPENDENT_AMBULATORY_CARE_PROVIDER_SITE_OTHER): Payer: Medicare Other

## 2013-12-12 ENCOUNTER — Encounter: Payer: Self-pay | Admitting: Family Medicine

## 2013-12-12 VITALS — BP 152/64 | HR 80 | Temp 97.3°F | Ht 62.0 in | Wt 197.0 lb

## 2013-12-12 DIAGNOSIS — E0590 Thyrotoxicosis, unspecified without thyrotoxic crisis or storm: Secondary | ICD-10-CM

## 2013-12-12 DIAGNOSIS — M25551 Pain in right hip: Secondary | ICD-10-CM

## 2013-12-12 DIAGNOSIS — R52 Pain, unspecified: Secondary | ICD-10-CM

## 2013-12-12 DIAGNOSIS — M25559 Pain in unspecified hip: Secondary | ICD-10-CM

## 2013-12-12 DIAGNOSIS — E785 Hyperlipidemia, unspecified: Secondary | ICD-10-CM

## 2013-12-12 DIAGNOSIS — E669 Obesity, unspecified: Secondary | ICD-10-CM

## 2013-12-12 DIAGNOSIS — E119 Type 2 diabetes mellitus without complications: Secondary | ICD-10-CM

## 2013-12-12 DIAGNOSIS — I10 Essential (primary) hypertension: Secondary | ICD-10-CM

## 2013-12-12 NOTE — Patient Instructions (Signed)
Hip Bursitis  Bursitis is a swelling and soreness (inflammation) of a fluid-filled sac (bursa). This sac overlies and protects the joints.   CAUSES   · Injury.  · Overuse of the muscles surrounding the joint.  · Arthritis.  · Gout.  · Infection.  · Cold weather.  · Inadequate warm-up and conditioning prior to activities.  The cause may not be known.   SYMPTOMS   · Mild to severe irritation.  · Tenderness and swelling over the outside of the hip.  · Pain with motion of the hip.  · If the bursa becomes infected, a fever may be present. Redness, tenderness, and warmth will develop over the hip.  Symptoms usually lessen in 3 to 4 weeks with treatment, but can come back.  TREATMENT  If conservative treatment does not work, your caregiver may advise draining the bursa and injecting cortisone into the area. This may speed up the healing process. This may also be used as an initial treatment of choice.  HOME CARE INSTRUCTIONS   · Apply ice to the affected area for 15-20 minutes every 3 to 4 hours while awake for the first 2 days. Put the ice in a plastic bag and place a towel between the bag of ice and your skin.  · Rest the painful joint as much as possible, but continue to put the joint through a normal range of motion at least 4 times per day. When the pain lessens, begin normal, slow movements and usual activities to help prevent stiffness of the hip.  · Only take over-the-counter or prescription medicines for pain, discomfort, or fever as directed by your caregiver.  · Use crutches to limit weight bearing on the hip joint, if advised.  · Elevate your painful hip to reduce swelling. Use pillows for propping and cushioning your legs and hips.  · Gentle massage may provide comfort and decrease swelling.  SEEK IMMEDIATE MEDICAL CARE IF:   · Your pain increases even during treatment, or you are not improving.  · You have a fever.  · You have heat and inflammation over the involved bursa.  · You have any other questions or  concerns.  MAKE SURE YOU:   · Understand these instructions.  · Will watch your condition.  · Will get help right away if you are not doing well or get worse.  Document Released: 02/14/2002 Document Revised: 11/17/2011 Document Reviewed: 09/13/2008  [employer]® Patient Information ©2014 [employer].

## 2013-12-12 NOTE — Progress Notes (Signed)
Patient ID: Elizabeth [name], female   DOB: 1941-05-14, 73 y.o.   MRN: [id number] SUBJECTIVE: CC: Chief Complaint  Patient presents with  . Acute Visit    pain rt hip x 1 week and numbness going down leg     HPI:  As above Pain in the posterior buttocks and it radiates to the back of the right thigh. Cannot lie on her right side. She can put her finger on an area at the back of the right hip joint and it is painful. No fever. No injury. Has been babying it for over 1 week.  Past Medical History  Diagnosis Date  . Diarrhea   . Obesity, unspecified   . Other and unspecified hyperlipidemia   . Unspecified essential hypertension   . Family history of malignant neoplasm of gastrointestinal tract   . Esophageal reflux   . Stricture and stenosis of esophagus   . Diverticulosis of colon (without mention of hemorrhage)   . Personal history of colonic polyps   . Active smoker   . Unspecified asthma(493.90)   . Type II or unspecified type diabetes mellitus without mention of complication, not stated as uncontrolled   . Chronic airway obstruction, not elsewhere classified   . Headache(784.0)     Irregular  . Anxiety     Takes Xanax for anxiety   Past Surgical History  Procedure Laterality Date  . Total abdominal hysterectomy    . Tonsillectomy    . Lumbar disc surgery    . Knee arthroscopy      left  . Appendectomy    . Cardiac catheterization  2006    LAD: 30%, RCA : 20%, normal EF, elevated LVEDP  . Cardiac catheterization    . Back surgery      Spinal surgery   History   Social History  . Marital Status: Married    Spouse Name: N/A    Number of Children: N/A  . Years of Education: N/A   Occupational History  . RN     [employer]   Social History Main Topics  . Smoking status: Former Smoker -- 20 years    Types: Cigarettes    Quit date: 02/27/2012  . Smokeless tobacco: Never Used  . Alcohol Use: No  . Drug Use: No  . Sexual Activity: Not on file   Other  Topics Concern  . Not on file   Social History Narrative  . No narrative on file   Family History  Problem Relation Age of Onset  . Colon cancer Other     first cousin, paternal aunt and uncle  . Cancer Paternal Grandmother     gastric  . Breast cancer Maternal Aunt     several paternal cousins  . Diabetes Mother   . Heart disease Mother   . Heart disease Maternal Grandmother   . Thyroid cancer Other     aunt  . Thyroid disease Sister     uncertain type-not cancer   Current Outpatient Prescriptions on File Prior to Visit  Medication Sig Dispense Refill  . ALPRAZolam (XANAX) 0.25 MG tablet TAKE ONE TABLET BY MOUTH TWICE DAILY AS NEEDED  60 tablet  2  . amLODipine (NORVASC) 10 MG tablet Take 10 mg by mouth daily.       Marland Kitchen aspirin EC 325 MG tablet Take 325 mg by mouth daily.      Marland Kitchen azelastine (ASTELIN) 137 MCG/SPRAY nasal spray Place 1 spray into both nostrils 2 (two)  times daily. Use in each nostril as directed      . clopidogrel (PLAVIX) 75 MG tablet Take 75 mg by mouth every other day.       . fluticasone (FLONASE) 50 MCG/ACT nasal spray Place 1 spray into the nose daily.  16 g  3  . hydrochlorothiazide (MICROZIDE) 12.5 MG capsule Take 12.5 mg by mouth daily.      . metFORMIN (GLUCOPHAGE) 1000 MG tablet Take 1 tablet (1,000 mg total) by mouth daily with breakfast.  90 tablet  4  . metoCLOPramide (REGLAN) 10 MG tablet Take 10 mg by mouth daily.      Marland Kitchen oxybutynin (DITROPAN XL) 15 MG 24 hr tablet Take 15 mg by mouth every morning.       . pantoprazole (PROTONIX) 40 MG tablet Take 40 mg by mouth daily.      . Pitavastatin Calcium (LIVALO) 2 MG TABS Take 1 tablet by mouth every other day.      . sitaGLIPtin (JANUVIA) 50 MG tablet Take 1 tablet (50 mg total) by mouth daily.  90 tablet  0  . traMADol (ULTRAM) 50 MG tablet Take 0.5-1 tablets (25-50 mg total) by mouth daily as needed (for body pain or headache).  30 tablet  0  . Vitamin D, Ergocalciferol, (DRISDOL) 50000 UNITS CAPS TAKE  ONE CAPSULE BY MOUTH ONCE WEEKLY  12 capsule  3  . [DISCONTINUED] ramipril (ALTACE) 10 MG capsule Take 10 mg by mouth daily.         No current facility-administered medications on file prior to visit.   Allergies  Allergen Reactions  . Banana Anaphylaxis  . Actos [Pioglitazone] Swelling  . Benicar [Olmesartan Medoxomil] Other (See Comments)    Feel bad  . Chantix [Varenicline] Other (See Comments)    "Worked opposite."  . Codeine Nausea And Vomiting  . Diovan [Valsartan] Other (See Comments)    Feel bad  . Latex     REACTION: red rash  . Lipitor [Atorvastatin] Other (See Comments)    REACTION:  Joint pain  . Spinach     Stomach problems  . Vicodin [Hydrocodone-Acetaminophen] Nausea And Vomiting  . Zocor [Simvastatin] Other (See Comments)    REACTION:  "made liver function incorrectly"  . Ace Inhibitors Cough  . Celebrex [Celecoxib] Rash  . Penicillins Rash  . Sulfonamide Derivatives Rash   Immunization History  Administered Date(s) Administered  . Influenza Whole 06/08/2010  . Influenza,inj,Quad PF,36+ Mos 07/07/2013  . Pneumococcal Conjugate-13 11/25/2013  . Pneumococcal Polysaccharide-23 09/09/2007   Prior to Admission medications   Medication Sig Start Date End Date Taking? Authorizing Provider  ALPRAZolam Duanne Moron) 0.25 MG tablet TAKE ONE TABLET BY MOUTH TWICE DAILY AS NEEDED    Chipper Herb, MD  amLODipine (NORVASC) 10 MG tablet Take 10 mg by mouth daily.     Historical Provider, MD  aspirin EC 325 MG tablet Take 325 mg by mouth daily.    Historical Provider, MD  azelastine (ASTELIN) 137 MCG/SPRAY nasal spray Place 1 spray into both nostrils 2 (two) times daily. Use in each nostril as directed    Historical Provider, MD  clopidogrel (PLAVIX) 75 MG tablet Take 75 mg by mouth every other day.     Historical Provider, MD  fluticasone (FLONASE) 50 MCG/ACT nasal spray Place 1 spray into the nose daily. 02/09/13   Chipper Herb, MD  hydrochlorothiazide (MICROZIDE) 12.5 MG  capsule Take 12.5 mg by mouth daily.    Historical Provider, MD  metFORMIN (  GLUCOPHAGE) 1000 MG tablet Take 1 tablet (1,000 mg total) by mouth daily with breakfast. 03/21/13   Ernestina Penna, MD  metoCLOPramide (REGLAN) 10 MG tablet Take 10 mg by mouth daily. 02/09/13   Ernestina Penna, MD  oxybutynin (DITROPAN XL) 15 MG 24 hr tablet Take 15 mg by mouth every morning.     Historical Provider, MD  pantoprazole (PROTONIX) 40 MG tablet Take 40 mg by mouth daily.    Historical Provider, MD  Pitavastatin Calcium (LIVALO) 2 MG TABS Take 1 tablet by mouth every other day.    Historical Provider, MD  sitaGLIPtin (JANUVIA) 50 MG tablet Take 1 tablet (50 mg total) by mouth daily. 10/12/13   Ernestina Penna, MD  traMADol (ULTRAM) 50 MG tablet Take 0.5-1 tablets (25-50 mg total) by mouth daily as needed (for body pain or headache). 10/18/13   Ernestina Penna, MD  Vitamin D, Ergocalciferol, (DRISDOL) 50000 UNITS CAPS TAKE ONE CAPSULE BY MOUTH ONCE WEEKLY 01/08/13   Ernestina Penna, MD     ROS: As above in the HPI. All other systems are stable or negative.  OBJECTIVE: APPEARANCE:  Patient in no acute distress.The patient appeared well nourished and normally developed. Acyanotic. Waist: VITAL SIGNS:BP 152/64  Pulse 80  Temp(Src) 97.3 F (36.3 C) (Oral)  Ht 5\' 2"  (1.575 m)  Wt 197 lb (89.359 kg)  BMI 36.02 kg/m2  Obese WF  SKIN: warm and  Dry without overt rashes, tattoos and scars  HEAD and Neck: without JVD, Head and scalp: normal Eyes:No scleral icterus. Fundi normal, eye movements normal. Ears: Auricle normal, canal normal, Tympanic membranes normal, insufflation normal. Nose: normal Throat: normal Neck & thyroid: normal  CHEST & LUNGS: Chest wall: normal Lungs: Clear  CVS: Reveals the PMI to be normally located. Regular rhythm, First and Second Heart sounds are normal,  absence of murmurs, rubs or gallops. Peripheral vasculature: Radial pulses: normal Dorsal pedis pulses: normal Posterior  pulses: normal  ABDOMEN:  Appearance: obese  Benign, no organomegaly, no masses, no Abdominal Aortic enlargement. No Guarding , no rebound. No Bruits. Bowel sounds: normal  RECTAL: N/A GU: N/A  EXTREMETIES: nonedematous.  MUSCULOSKELETAL:  Spine: normal Joints: right posterior hip point tenderness into the joint. No redness no swelling  NEUROLOGIC: oriented to time,place and person; nonfocal. Strength is normal Sensory is normal Reflexes are normal Cranial Nerves are normal. SLR negative.    ASSESSMENT: Pain - Plan: DG Hip Complete Right, Ambulatory referral to Orthopedic Surgery  Hip pain, right - Plan: Ambulatory referral to Orthopedic Surgery  Obesity, unspecified  HYPERTENSION  HYPERLIPIDEMIA  HYPERTHYROIDISM  DM Suspect right trochanteric bursitis.  PLAN: Use the tramadol as she has been doing. Ice the area. Refer to orthopedics. Handout on bursitis given.  Orders Placed This Encounter  Procedures  . DG Hip Complete Right    Standing Status: Future     Number of Occurrences: 1     Standing Expiration Date: 02/11/2015    Order Specific Question:  Reason for Exam (SYMPTOM  OR DIAGNOSIS REQUIRED)    Answer:  pain    Order Specific Question:  Preferred imaging location?    Answer:  Internal  . Ambulatory referral to Orthopedic Surgery    Referral Priority:  Routine    Referral Type:  Surgical    Referral Reason:  Specialty Services Required    Requested Specialty:  Orthopedic Surgery    Number of Visits Requested:  1  [facility] reading (PRIMARY) by  Dr.  Milka Windholz: calcification along the edge of the capsule/right trochanteric bursa. ? trachanteric bursitis.                                 No orders of the defined types were placed in this encounter.   There are no discontinued medications. Return if symptoms worsen or fail to improve.  Elizabeth [name] P. Jacelyn Grip, M.D.

## 2013-12-19 ENCOUNTER — Other Ambulatory Visit: Payer: Self-pay | Admitting: Family Medicine

## 2013-12-22 ENCOUNTER — Other Ambulatory Visit: Payer: Self-pay | Admitting: *Deleted

## 2013-12-22 MED ORDER — OXYBUTYNIN CHLORIDE ER 15 MG PO TB24: 15.0000 mg | ORAL_TABLET | Freq: Every morning | ORAL | Status: DC

## 2013-12-28 ENCOUNTER — Other Ambulatory Visit: Payer: Self-pay | Admitting: Family Medicine

## 2013-12-29 ENCOUNTER — Encounter: Payer: Self-pay | Admitting: *Deleted

## 2014-01-03 ENCOUNTER — Other Ambulatory Visit: Payer: Self-pay | Admitting: Family Medicine

## 2014-01-23 ENCOUNTER — Ambulatory Visit (HOSPITAL_COMMUNITY)
Admission: RE | Admit: 2014-01-23 | Discharge: 2014-01-23 | Disposition: A | Discharge status: Home / Self Care | Payer: Medicare Other | Source: Ambulatory Visit | Attending: Family Medicine | Admitting: Family Medicine

## 2014-01-23 DIAGNOSIS — Z1231 Encounter for screening mammogram for malignant neoplasm of breast: Secondary | ICD-10-CM | POA: Insufficient documentation

## 2014-02-20 ENCOUNTER — Ambulatory Visit: Payer: Medicare Other | Admitting: Family Medicine

## 2014-02-22 ENCOUNTER — Ambulatory Visit: Payer: Medicare Other | Admitting: Family Medicine

## 2014-02-24 ENCOUNTER — Ambulatory Visit (INDEPENDENT_AMBULATORY_CARE_PROVIDER_SITE_OTHER): Payer: Medicare Other | Admitting: Family Medicine

## 2014-02-24 ENCOUNTER — Encounter: Payer: Self-pay | Admitting: Family Medicine

## 2014-02-24 VITALS — BP 137/74 | HR 81 | Temp 97.6°F | Ht 62.0 in | Wt 195.0 lb

## 2014-02-24 DIAGNOSIS — M25559 Pain in unspecified hip: Secondary | ICD-10-CM

## 2014-02-24 DIAGNOSIS — J449 Chronic obstructive pulmonary disease, unspecified: Secondary | ICD-10-CM

## 2014-02-24 DIAGNOSIS — M25551 Pain in right hip: Secondary | ICD-10-CM

## 2014-02-24 DIAGNOSIS — E559 Vitamin D deficiency, unspecified: Secondary | ICD-10-CM

## 2014-02-24 DIAGNOSIS — J45909 Unspecified asthma, uncomplicated: Secondary | ICD-10-CM

## 2014-02-24 DIAGNOSIS — Z13820 Encounter for screening for osteoporosis: Secondary | ICD-10-CM

## 2014-02-24 DIAGNOSIS — E119 Type 2 diabetes mellitus without complications: Secondary | ICD-10-CM

## 2014-02-24 DIAGNOSIS — I10 Essential (primary) hypertension: Secondary | ICD-10-CM

## 2014-02-24 DIAGNOSIS — E0590 Thyrotoxicosis, unspecified without thyrotoxic crisis or storm: Secondary | ICD-10-CM

## 2014-02-24 DIAGNOSIS — J4489 Other specified chronic obstructive pulmonary disease: Secondary | ICD-10-CM

## 2014-02-24 DIAGNOSIS — E785 Hyperlipidemia, unspecified: Secondary | ICD-10-CM

## 2014-02-24 LAB — POCT CBC
Granulocyte percent: 63.8 %G (ref 37–80)
HCT, POC: 41.8 % (ref 37.7–47.9)
Hemoglobin: 12.8 g/dL (ref 12.2–16.2)
Lymph, poc: 1.5 (ref 0.6–3.4)
MCH, POC: 25.9 pg — AB (ref 27–31.2)
MCHC: 30.6 g/dL — AB (ref 31.8–35.4)
MCV: 84.6 fL (ref 80–97)
MPV: 7.5 fL (ref 0–99.8)
POC Granulocyte: 3.5 (ref 2–6.9)
POC LYMPH PERCENT: 27.2 %L (ref 10–50)
Platelet Count, POC: 300 10*3/uL (ref 142–424)
RBC: 5 M/uL (ref 4.04–5.48)
RDW, POC: 14.3 %
WBC: 5.5 10*3/uL (ref 4.6–10.2)

## 2014-02-24 LAB — POCT GLYCOSYLATED HEMOGLOBIN (HGB A1C): Hemoglobin A1C: 5.7

## 2014-02-24 MED ORDER — AZELASTINE HCL 0.1 % NA SOLN: 1.0000 | Freq: Two times a day (BID) | NASAL | Status: DC

## 2014-02-24 NOTE — Progress Notes (Addendum)
Subjective:    Patient ID: Elizabeth [name], female    DOB: 09/10/1940, 73 y.o.   MRN: [id number]  HPI Pt here for follow up and management of chronic medical problems. Patient is doing well. She is having problems with taking tramadol. She has a refill on her Astelin. She also complains of a nevus on her left shoulder that she wants [place] to evaluate. On health maintenance she is up-to-date except for getting lab work, returning an FOBT and getting a DEXA scan done. The patient indicates her blood sugars have been running around the 120s at home and that she did have an injection in her right hip about 6 weeks ago for bursitis. Her blood pressures have been running the 130s over the 70s.         Patient Active Problem List   Diagnosis Date Noted  . CVA (cerebral vascular accident) 02/27/2012  . Cerebral aneurysm 02/27/2012  . 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 Gait instability 02/27/2012  . Vision disturbance S/P CVA (cerebrovascular accident) 02/27/2012  . Dyspnea 11/14/2011  . HYPERTHYROIDISM 10/12/2008  . DM 08/29/2008  . HYPERLIPIDEMIA 08/29/2008  . OBESITY, UNSPECIFIED 08/29/2008  . COPD 08/29/2008  . WEIGHT LOSS 08/29/2008  . DIARRHEA 08/29/2008  . HYPERTENSION 08/28/2008  . ASTHMA 08/28/2008  . ESOPHAGEAL STRICTURE 08/28/2008  . GERD 08/28/2008  . HIATAL HERNIA 08/28/2008  . DIVERTICULOSIS, COLON 08/28/2008  . COLONIC POLYPS, HX OF 08/28/2008   Outpatient Encounter Prescriptions as of 02/24/2014  Medication Sig  . ALPRAZolam (XANAX) 0.25 MG tablet TAKE ONE TABLET BY MOUTH TWICE DAILY AS NEEDED  . amLODipine (NORVASC) 10 MG tablet TAKE ONE TABLET BY MOUTH ONCE DAILY  . aspirin EC 325 MG tablet Take 325 mg by mouth daily.  . cholecalciferol (VITAMIN D) 1000 UNITS tablet Take 1,000 Units by mouth daily.  . clopidogrel (PLAVIX) 75 MG tablet TAKE ONE TABLET BY MOUTH ONCE DAILY  . escitalopram (LEXAPRO) 20 MG tablet Take 20 mg by mouth daily.  . fluticasone (FLONASE) 50  MCG/ACT nasal spray Place 1 spray into the nose daily.  . hydrochlorothiazide (MICROZIDE) 12.5 MG capsule TAKE ONE CAPSULE BY MOUTH ONCE DAILY  . metFORMIN (GLUCOPHAGE) 1000 MG tablet Take 1 tablet (1,000 mg total) by mouth daily with breakfast.  . metoCLOPramide (REGLAN) 10 MG tablet Take 10 mg by mouth daily.  Marland Kitchen oxybutynin (DITROPAN XL) 15 MG 24 hr tablet Take 1 tablet (15 mg total) by mouth every morning.  . pantoprazole (PROTONIX) 40 MG tablet TAKE ONE TABLET BY MOUTH ONCE DAILY  . sitaGLIPtin (JANUVIA) 50 MG tablet Take 1 tablet (50 mg total) by mouth daily.  . traMADol (ULTRAM) 50 MG tablet Take 0.5-1 tablets (25-50 mg total) by mouth daily as needed (for body pain or headache).  . [DISCONTINUED] Pitavastatin Calcium (LIVALO) 2 MG TABS Take 1 tablet by mouth every other day.  . [DISCONTINUED] azelastine (ASTELIN) 137 MCG/SPRAY nasal spray Place 1 spray into both nostrils 2 (two) times daily. Use in each nostril as directed  . [DISCONTINUED] Vitamin D, Ergocalciferol, (DRISDOL) 50000 UNITS CAPS TAKE ONE CAPSULE BY MOUTH ONCE WEEKLY    Review of Systems  Constitutional: Negative.   HENT: Negative.   Eyes: Negative.   Respiratory: Negative.   Cardiovascular: Negative.   Gastrointestinal: Negative.   Endocrine: Negative.   Genitourinary: Negative.   Musculoskeletal: Negative.   Skin: Negative.        Mole on left shoulder  Allergic/Immunologic: Negative.   Neurological:  Negative.   Hematological: Negative.   Psychiatric/Behavioral: Negative.        Objective:   Physical Exam  Nursing note and vitals reviewed. Constitutional: She is oriented to person, place, and time. She appears well-developed and well-nourished. No distress.  HENT:  Head: Normocephalic and atraumatic.  Right Ear: External ear normal.  Mouth/Throat: Oropharynx is clear and moist. No oropharyngeal exudate.  Minimal nasal congestion. Ear cerumen left ear canal  Eyes: Conjunctivae and EOM are normal. Pupils  are equal, round, and reactive to light. Right eye exhibits no discharge. Left eye exhibits no discharge. No scleral icterus.  Neck: Normal range of motion. Neck supple. No JVD present. No thyromegaly present.  No carotid bruits  Cardiovascular: Normal rate, regular rhythm, normal heart sounds and intact distal pulses.  Exam reveals no gallop and no friction rub.   No murmur heard. At 72 per minute  Pulmonary/Chest: Effort normal and breath sounds normal. No respiratory distress. She has no wheezes. She has no rales. She exhibits no tenderness.  Abdominal: Soft. Bowel sounds are normal. She exhibits no mass. There is no tenderness. There is no rebound and no guarding.  Obesity without tenderness or masses  Musculoskeletal: Normal range of motion. She exhibits no edema and no tenderness.  Lymphadenopathy:    She has no cervical adenopathy.  Neurological: She is alert and oriented to person, place, and time. No cranial nerve deficit.  B. lower extremity DTRs were diminished on the right side compared to the left side.  Skin: Skin is warm and dry. No rash noted.  Nevus left superior shoulder benign  Psychiatric: She has a normal mood and affect. Her behavior is normal. Judgment and thought content normal.   BP 137/74  Pulse 81  Temp(Src) 97.6 F (36.4 C) (Oral)  Ht _0  (1.575 m)  Wt 195 lb (88.451 kg)  BMI 35.66 kg/m2        Assessment & Plan:  1. ASTHMA - POCT CBC  2. COPD - POCT CBC  3. DM - POCT CBC - POCT glycosylated hemoglobin (Hb A1C)  4. HYPERLIPIDEMIA - POCT CBC - NMR, lipoprofile  5. HYPERTENSION - POCT CBC - BMP8+EGFR - Hepatic function panel  6. HYPERTHYROIDISM - POCT CBC  7. Vitamin D deficiency - Vit D  25 hydroxy (rtn osteoporosis monitoring) - DG Bone Density; Future  8. Screening for osteoporosis - DG Bone Density; Future  9. Right hip pain -Appointment for injection  10. History of CVA -Patient needs to schedule an appointment to see  the neurologist   Patient Instructions                       Medicare Annual Wellness Visit  [facility] and the medical providers at [facility] strive to bring you the best medical care.  In doing so we not only want to address your current medical conditions and concerns but also to detect new conditions early and prevent illness, disease and health-related problems.    Medicare offers a yearly Wellness Visit which allows our clinical staff to assess your need for preventative services including immunizations, lifestyle education, counseling to decrease risk of preventable diseases and screening for fall risk and other medical concerns.    This visit is provided free of charge (no copay) for all Medicare recipients. The clinical pharmacists at [facility] have begun to conduct these Wellness Visits which will also include a thorough review of all your medications.  As you primary medical provider recommend that you make an appointment for your Annual Wellness Visit if you have not done so already this year.  You may set up this appointment before you leave today or you may call back ([phone]) and schedule an appointment.  Please make sure when you call that you mention that you are scheduling your Annual Wellness Visit with the clinical pharmacist so that the appointment may be made for the proper length of time.      Continue current medications. Continue good therapeutic lifestyle changes which include good diet and exercise. Fall precautions discussed with patient. If an FOBT was given today- please return it to our front desk. If you are over 37 years old - you may need Prevnar 16 or the adult Pneumonia vaccine.  Return to clinic for hip injection. Be sure and schedule appointment with the neurologist for followup   Arrie Senate MD

## 2014-02-24 NOTE — Patient Instructions (Addendum)
Medicare Annual Wellness Visit  [facility] and the medical providers at [facility] strive to bring you the best medical care.  In doing so we not only want to address your current medical conditions and concerns but also to detect new conditions early and prevent illness, disease and health-related problems.    Medicare offers a yearly Wellness Visit which allows our clinical staff to assess your need for preventative services including immunizations, lifestyle education, counseling to decrease risk of preventable diseases and screening for fall risk and other medical concerns.    This visit is provided free of charge (no copay) for all Medicare recipients. The clinical pharmacists at [facility] have begun to conduct these Wellness Visits which will also include a thorough review of all your medications.    As you primary medical provider recommend that you make an appointment for your Annual Wellness Visit if you have not done so already this year.  You may set up this appointment before you leave today or you may call back ([phone]) and schedule an appointment.  Please make sure when you call that you mention that you are scheduling your Annual Wellness Visit with the clinical pharmacist so that the appointment may be made for the proper length of time.      Continue current medications. Continue good therapeutic lifestyle changes which include good diet and exercise. Fall precautions discussed with patient. If an FOBT was given today- please return it to our front desk. If you are over 32 years old - you may need Prevnar 48 or the adult Pneumonia vaccine.  Return to clinic for hip injection. Be sure and schedule appointment with the neurologist for followup

## 2014-02-25 LAB — NMR, LIPOPROFILE
Cholesterol: 304 mg/dL — ABNORMAL HIGH (ref 100–199)
HDL Cholesterol by NMR: 47 mg/dL (ref >39)
HDL Particle Number: 26.7 umol/L — ABNORMAL LOW (ref >=30.5)
LDL Particle Number: 2591 nmol/L — ABNORMAL HIGH (ref <1000)
LDL Size: 21.4 nm (ref >20.5)
LDLC SERPL CALC-MCNC: 230 mg/dL — ABNORMAL HIGH (ref 0–99)
LP-IR Score: 44 (ref <=45)
Small LDL Particle Number: 860 nmol/L — ABNORMAL HIGH (ref <=527)
Triglycerides by NMR: 135 mg/dL (ref 0–149)

## 2014-02-25 LAB — HEPATIC FUNCTION PANEL
ALT: 100 IU/L — ABNORMAL HIGH (ref 0–32)
AST: 85 IU/L — ABNORMAL HIGH (ref 0–40)
Albumin: 4.7 g/dL (ref 3.5–4.8)
Alkaline Phosphatase: 85 IU/L (ref 39–117)
Bilirubin, Direct: 0.15 mg/dL (ref 0.00–0.40)
Total Bilirubin: 0.5 mg/dL (ref 0.0–1.2)
Total Protein: 7.1 g/dL (ref 6.0–8.5)

## 2014-02-25 LAB — BMP8+EGFR
BUN/Creatinine Ratio: 11 (ref 11–26)
BUN: 9 mg/dL (ref 8–27)
CO2: 25 mmol/L (ref 18–29)
Calcium: 10.3 mg/dL (ref 8.7–10.3)
Chloride: 92 mmol/L — ABNORMAL LOW (ref 97–108)
Creatinine, Ser: 0.85 mg/dL (ref 0.57–1.00)
GFR calc Af Amer: 80 mL/min/{1.73_m2} (ref >59)
GFR calc non Af Amer: 69 mL/min/{1.73_m2} (ref >59)
Glucose: 132 mg/dL — ABNORMAL HIGH (ref 65–99)
Potassium: 3.6 mmol/L (ref 3.5–5.2)
Sodium: 137 mmol/L (ref 134–144)

## 2014-02-25 LAB — VITAMIN D 25 HYDROXY (VIT D DEFICIENCY, FRACTURES): Vit D, 25-Hydroxy: 18.9 ng/mL — ABNORMAL LOW (ref 30.0–100.0)

## 2014-02-28 ENCOUNTER — Ambulatory Visit: Payer: Medicare Other | Admitting: Physician Assistant

## 2014-03-02 ENCOUNTER — Ambulatory Visit: Payer: Medicare Other | Admitting: Physician Assistant

## 2014-03-09 ENCOUNTER — Telehealth: Payer: Self-pay | Admitting: Family Medicine

## 2014-03-09 ENCOUNTER — Other Ambulatory Visit: Payer: Self-pay | Admitting: Family Medicine

## 2014-03-09 MED ORDER — TRAMADOL HCL 50 MG PO TABS: 25.0000 mg | ORAL_TABLET | Freq: Every day | ORAL | Status: DC | PRN

## 2014-03-09 NOTE — Telephone Encounter (Signed)
Needs refill on her tramodal or she wanted to know if you were going to change it. Please advise?

## 2014-03-09 NOTE — Progress Notes (Signed)
Pt notified Rx ready for pickup 

## 2014-03-25 ENCOUNTER — Other Ambulatory Visit: Payer: Self-pay | Admitting: Family Medicine

## 2014-04-03 ENCOUNTER — Other Ambulatory Visit: Payer: Self-pay | Admitting: Family Medicine

## 2014-04-13 ENCOUNTER — Telehealth: Payer: Self-pay | Admitting: Family Medicine

## 2014-04-13 DIAGNOSIS — R52 Pain, unspecified: Secondary | ICD-10-CM

## 2014-04-13 NOTE — Telephone Encounter (Signed)
Yes, please make this referral. I wish that we had done earlier and she could have seen the orthopedist today.

## 2014-04-13 NOTE — Telephone Encounter (Signed)
Pt to call me back and let me know whom she prefers to see

## 2014-04-13 NOTE — Telephone Encounter (Signed)
Is this ok to do?

## 2014-04-18 ENCOUNTER — Other Ambulatory Visit: Payer: Self-pay | Admitting: Family Medicine

## 2014-04-20 ENCOUNTER — Other Ambulatory Visit: Payer: Self-pay

## 2014-04-20 DIAGNOSIS — I639 Cerebral infarction, unspecified: Secondary | ICD-10-CM

## 2014-04-28 ENCOUNTER — Encounter: Payer: Self-pay | Admitting: Gastroenterology

## 2014-05-04 LAB — HM DIABETES EYE EXAM

## 2014-05-10 ENCOUNTER — Other Ambulatory Visit: Payer: Self-pay | Admitting: *Deleted

## 2014-05-10 MED ORDER — CLOPIDOGREL BISULFATE 75 MG PO TABS: ORAL_TABLET | ORAL | Status: DC

## 2014-05-10 MED ORDER — PANTOPRAZOLE SODIUM 40 MG PO TBEC: DELAYED_RELEASE_TABLET | ORAL | Status: DC

## 2014-05-24 ENCOUNTER — Other Ambulatory Visit: Payer: Medicare Other

## 2014-05-24 ENCOUNTER — Ambulatory Visit (INDEPENDENT_AMBULATORY_CARE_PROVIDER_SITE_OTHER): Payer: Medicare Other | Admitting: Family Medicine

## 2014-05-24 ENCOUNTER — Encounter: Payer: Self-pay | Admitting: Family Medicine

## 2014-05-24 ENCOUNTER — Ambulatory Visit: Payer: Medicare Other

## 2014-05-24 VITALS — BP 144/68 | HR 87 | Temp 97.1°F | Ht 62.0 in | Wt 194.0 lb

## 2014-05-24 DIAGNOSIS — J069 Acute upper respiratory infection, unspecified: Secondary | ICD-10-CM

## 2014-05-24 MED ORDER — AZITHROMYCIN 250 MG PO TABS: ORAL_TABLET | ORAL | Status: DC

## 2014-05-24 MED ORDER — METHYLPREDNISOLONE ACETATE 40 MG/ML IJ SUSP
40.0000 mg | Freq: Once | INTRAMUSCULAR | Status: AC
  Administered 2014-05-24: 40 mg via INTRAMUSCULAR

## 2014-05-24 NOTE — Progress Notes (Signed)
   Subjective:    Patient ID: [name], female    DOB: 1940-10-10, 73 y.o.   MRN: [id number]  HPI  This 73 y.o. female presents for evaluation of sinus drainage and sore throat.  She is having a cough.  Review of Systems    No chest pain, SOB, HA, dizziness, vision change, N/V, diarrhea, constipation, dysuria, urinary urgency or frequency, myalgias, arthralgias or rash.  Objective:   Physical Exam  Vital signs noted  Well developed well nourished female.  HEENT - Head atraumatic Normocephalic Respiratory - Lungs CTA bilateral Cardiac - RRR S1 and S2 without murmur GI - Abdomen soft Nontender and bowel sounds active x 4 Extremities - No edema. Neuro - Grossly intact.      Assessment & Plan:  URI (upper respiratory infection) - Plan: azithromycin (ZITHROMAX) 250 MG tablet, methylPREDNISolone acetate (DEPO-MEDROL) injection 40 mg  Lysbeth Penner FNP

## 2014-05-26 ENCOUNTER — Other Ambulatory Visit: Payer: Self-pay | Admitting: Family Medicine

## 2014-06-07 ENCOUNTER — Other Ambulatory Visit: Payer: Self-pay | Admitting: Family Medicine

## 2014-06-08 ENCOUNTER — Encounter: Payer: Self-pay | Admitting: *Deleted

## 2014-06-08 NOTE — Telephone Encounter (Signed)
Called in.

## 2014-06-08 NOTE — Telephone Encounter (Signed)
Last filled 04/03/14, last seen 02/24/14. Route to pool A, nurse call into [facility] [phone]

## 2014-06-08 NOTE — Telephone Encounter (Signed)
Both of these prescriptions are okay to refill for 3 month

## 2014-06-16 ENCOUNTER — Other Ambulatory Visit: Payer: Self-pay | Admitting: Family Medicine

## 2014-06-21 ENCOUNTER — Other Ambulatory Visit: Payer: Self-pay | Admitting: Family Medicine

## 2014-07-05 ENCOUNTER — Telehealth: Payer: Self-pay | Admitting: Family Medicine

## 2014-07-05 ENCOUNTER — Other Ambulatory Visit: Payer: Self-pay | Admitting: Family Medicine

## 2014-07-05 MED ORDER — OXYBUTYNIN CHLORIDE ER 15 MG PO TB24: 15.0000 mg | ORAL_TABLET | Freq: Every morning | ORAL | Status: DC

## 2014-07-06 ENCOUNTER — Ambulatory Visit (INDEPENDENT_AMBULATORY_CARE_PROVIDER_SITE_OTHER): Payer: Medicare Other | Admitting: Family Medicine

## 2014-07-06 ENCOUNTER — Encounter: Payer: Self-pay | Admitting: Family Medicine

## 2014-07-06 VITALS — BP 122/64 | HR 73 | Temp 98.8°F | Ht 62.0 in | Wt 193.0 lb

## 2014-07-06 DIAGNOSIS — E559 Vitamin D deficiency, unspecified: Secondary | ICD-10-CM

## 2014-07-06 DIAGNOSIS — E118 Type 2 diabetes mellitus with unspecified complications: Secondary | ICD-10-CM

## 2014-07-06 DIAGNOSIS — K219 Gastro-esophageal reflux disease without esophagitis: Secondary | ICD-10-CM

## 2014-07-06 DIAGNOSIS — M79604 Pain in right leg: Secondary | ICD-10-CM

## 2014-07-06 DIAGNOSIS — I6359 Cerebral infarction due to unspecified occlusion or stenosis of other cerebral artery: Secondary | ICD-10-CM

## 2014-07-06 DIAGNOSIS — I10 Essential (primary) hypertension: Secondary | ICD-10-CM

## 2014-07-06 DIAGNOSIS — E785 Hyperlipidemia, unspecified: Secondary | ICD-10-CM

## 2014-07-06 DIAGNOSIS — E079 Disorder of thyroid, unspecified: Secondary | ICD-10-CM

## 2014-07-06 DIAGNOSIS — Z23 Encounter for immunization: Secondary | ICD-10-CM

## 2014-07-06 LAB — POCT CBC
Granulocyte percent: 70.1 %G (ref 37–80)
HCT, POC: 38.3 % (ref 37.7–47.9)
Hemoglobin: 12.6 g/dL (ref 12.2–16.2)
Lymph, poc: 1.8 (ref 0.6–3.4)
MCH, POC: 27.3 pg (ref 27–31.2)
MCHC: 32.9 g/dL (ref 31.8–35.4)
MCV: 82.8 fL (ref 80–97)
MPV: 8.2 fL (ref 0–99.8)
POC Granulocyte: 4.9 (ref 2–6.9)
POC LYMPH PERCENT: 25.9 %L (ref 10–50)
Platelet Count, POC: 335 10*3/uL (ref 142–424)
RBC: 4.6 M/uL (ref 4.04–5.48)
RDW, POC: 15.2 %
WBC: 7 10*3/uL (ref 4.6–10.2)

## 2014-07-06 LAB — POCT UA - MICROALBUMIN: Microalbumin Ur, POC: 50 mg/L

## 2014-07-06 LAB — POCT GLYCOSYLATED HEMOGLOBIN (HGB A1C): Hemoglobin A1C: 5.7

## 2014-07-06 MED ORDER — GABAPENTIN 100 MG PO CAPS
300.0000 mg | ORAL_CAPSULE | Freq: Every day | ORAL | Status: DC
Start: 2014-07-06 — End: 2014-11-25

## 2014-07-06 NOTE — Addendum Note (Signed)
Addended by: Zannie Cove on: 07/06/2014 04:15 PM   Modules accepted: Orders

## 2014-07-06 NOTE — Patient Instructions (Addendum)
Medicare Annual Wellness Visit  [facility] and the medical providers at [facility] strive to bring you the best medical care.  In doing so we not only want to address your current medical conditions and concerns but also to detect new conditions early and prevent illness, disease and health-related problems.    Medicare offers a yearly Wellness Visit which allows our clinical staff to assess your need for preventative services including immunizations, lifestyle education, counseling to decrease risk of preventable diseases and screening for fall risk and other medical concerns.    This visit is provided free of charge (no copay) for all Medicare recipients. The clinical pharmacists at [facility] have begun to conduct these Wellness Visits which will also include a thorough review of all your medications.    As you primary medical provider recommend that you make an appointment for your Annual Wellness Visit if you have not done so already this year.  You may set up this appointment before you leave today or you may call back ([phone]) and schedule an appointment.  Please make sure when you call that you mention that you are scheduling your Annual Wellness Visit with the clinical pharmacist so that the appointment may be made for the proper length of time.       Continue current medications. Continue good therapeutic lifestyle changes which include good diet and exercise. Fall precautions discussed with patient. If an FOBT was given today- please return it to our front desk. If you are over 64 years old - you may need Prevnar 54 or the adult Pneumonia vaccine.  Flu Shots will be available at our office starting mid- September. Please call and schedule a FLU CLINIC APPOINTMENT.   Check blood pressures and blood sugars more regularly Please call back and let us know what kind of strip she need for [place] to call in so that you can  check her blood sugars more regularly Drink plenty of fluids  Follow-up visits with urology, neurology, cardiology, and interventional radiology.

## 2014-07-06 NOTE — Progress Notes (Signed)
Subjective:    Patient ID: Elizabeth [name], female    DOB: [birth year], 72 y.o.   MRN: [id number]  HPI Pt here for follow up and management of chronic medical problems. The patient does complain of some right leg pain and arthralgia. She is due to get a urine microalbumin today. She is also due for an FOBT and flu shot and lab work. She will be scheduled for a pelvic exam with the nurse practitioner.          Patient Active Problem List   Diagnosis Date Noted  . CVA (cerebral vascular accident) 02/27/2012  . Cerebral aneurysm 02/27/2012  . 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 Gait instability 02/27/2012  . Vision disturbance S/P CVA (cerebrovascular accident) 02/27/2012  . Dyspnea 11/14/2011  . Thyroid disease 10/12/2008  . Diabetes 08/29/2008  . Hyperlipidemia 08/29/2008  . OBESITY, UNSPECIFIED 08/29/2008  . COPD 08/29/2008  . WEIGHT LOSS 08/29/2008  . DIARRHEA 08/29/2008  . HTN (hypertension) 08/28/2008  . ASTHMA 08/28/2008  . ESOPHAGEAL STRICTURE 08/28/2008  . GERD 08/28/2008  . HIATAL HERNIA 08/28/2008  . DIVERTICULOSIS, COLON 08/28/2008  . COLONIC POLYPS, HX OF 08/28/2008   Outpatient Encounter Prescriptions as of 07/06/2014  Medication Sig  . ALPRAZolam (XANAX) 0.25 MG tablet TAKE ONE TABLET BY MOUTH TWICE DAILY AS NEEDED  . amLODipine (NORVASC) 10 MG tablet TAKE ONE TABLET BY MOUTH ONCE DAILY  . aspirin EC 325 MG tablet Take 325 mg by mouth daily.  . cholecalciferol (VITAMIN D) 1000 UNITS tablet Take 1,000 Units by mouth daily.  . clopidogrel (PLAVIX) 75 MG tablet TAKE ONE TABLET BY MOUTH ONCE DAILY  . escitalopram (LEXAPRO) 20 MG tablet TAKE ONE TABLET BY MOUTH ONCE DAILY AS DIRECTED  . fluticasone (FLONASE) 50 MCG/ACT nasal spray USE ONE SPRAY(S) INTO THE NOSE DAILY  . hydrochlorothiazide (MICROZIDE) 12.5 MG capsule TAKE ONE CAPSULE BY MOUTH ONCE DAILY  . JANUVIA 50 MG tablet TAKE ONE TABLET BY MOUTH ONCE DAILY  . metFORMIN (GLUCOPHAGE) 1000 MG tablet TAKE ONE  TABLET BY MOUTH IN THE MORNING WITH BREAKFAST  . metoCLOPramide (REGLAN) 10 MG tablet TAKE ONE TABLET BY MOUTH 2 TIMES DAILY  . oxybutynin (DITROPAN XL) 15 MG 24 hr tablet Take 1 tablet (15 mg total) by mouth every morning.  . pantoprazole (PROTONIX) 40 MG tablet TAKE ONE TABLET BY MOUTH ONCE DAILY  . traMADol (ULTRAM) 50 MG tablet Take 0.5-1 tablets (25-50 mg total) by mouth daily as needed (for body pain or headache).  . [DISCONTINUED] azelastine (ASTELIN) 0.1 % nasal spray Place 1 spray into both nostrils 2 (two) times daily. Use in each nostril as directed  . [DISCONTINUED] metoCLOPramide (REGLAN) 10 MG tablet TAKE ONE TABLET BY MOUTH 4 TIMES DAILY  . [DISCONTINUED] azithromycin (ZITHROMAX) 250 MG tablet Take 2 po first day and then one po qd x 4 days    Review of Systems  Constitutional: Negative.   HENT: Negative.   Eyes: Negative.   Respiratory: Negative.   Cardiovascular: Negative.   Gastrointestinal: Negative.   Endocrine: Negative.   Genitourinary: Negative.   Musculoskeletal: Positive for arthralgias (right leg pain ).  Skin: Negative.   Allergic/Immunologic: Negative.   Neurological: Negative.   Hematological: Negative.   Psychiatric/Behavioral: Negative.        Objective:   Physical Exam  Nursing note and vitals reviewed. Constitutional: She is oriented to person, place, and time. She appears well-developed and well-nourished. No distress.  Alert, and pleasant  HENT:  Head: Normocephalic and atraumatic.  Right Ear: External ear normal.  Mouth/Throat: Oropharynx is clear and moist. No oropharyngeal exudate.  Left ear cerumen and nasal congestion  Eyes: Conjunctivae and EOM are normal. Pupils are equal, round, and reactive to light. Right eye exhibits no discharge. Left eye exhibits no discharge. No scleral icterus.  Neck: Normal range of motion. Neck supple. No thyromegaly present.  No carotid bruits  Cardiovascular: Normal rate, regular rhythm, normal heart  sounds and intact distal pulses.   No murmur heard. At 60/m  Pulmonary/Chest: Effort normal and breath sounds normal. No respiratory distress. She has no wheezes. She has no rales. She exhibits no tenderness.  Abdominal: Soft. Bowel sounds are normal. She exhibits no mass. There is no tenderness. There is no rebound and no guarding.  Obesity present without abdominal bruits  Musculoskeletal: Normal range of motion. She exhibits no edema and no tenderness.  Lymphadenopathy:    She has no cervical adenopathy.  Neurological: She is alert and oriented to person, place, and time. She has normal reflexes. No cranial nerve deficit.  Skin: Skin is warm and dry. No rash noted. No erythema. No pallor.  Dry skin. Skin lesion right upper lateral thigh which appears to be an infected hair follicle  Psychiatric: She has a normal mood and affect. Her behavior is normal. Judgment and thought content normal.   BP 122/64  Pulse 73  Temp(Src) 98.8 F (37.1 C) (Oral)  Ht 5\' 2"  (1.575 m)  Wt 193 lb (87.544 kg)  BMI 35.29 kg/m2          Assessment & Plan:  1. Type 2 diabetes mellitus with complication - POCT UA - Microalbumin - Microalbumin, urine  2. Gastroesophageal reflux disease, esophagitis presence not specified - Microalbumin, urine  3. Hyperlipidemia - Microalbumin, urine  4. Thyroid disease - Microalbumin, urine  5. Essential hypertension - POCT UA - Microalbumin - Microalbumin, urine  6. Encounter for immunization  7. Right leg pain -Start gabapentin  8. 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 Ears cerumen -Use debrox  Meds ordered this encounter  Medications  . metoCLOPramide (REGLAN) 10 MG tablet    Sig: TAKE ONE TABLET BY MOUTH 2 TIMES DAILY  . gabapentin (NEURONTIN) 100 MG capsule    Sig: Take 3 capsules (300 mg total) by mouth at bedtime. As directed    Dispense:  90 capsule    Refill:  3   Patient Instructions                        Medicare Annual Wellness Visit  [facility] and the medical providers at [facility] strive to bring you the best medical care.  In doing so we not only want to address your current medical conditions and concerns but also to detect new conditions early and prevent illness, disease and health-related problems.    Medicare offers a yearly Wellness Visit which allows our clinical staff to assess your need for preventative services including immunizations, lifestyle education, counseling to decrease risk of preventable diseases and screening for fall risk and other medical concerns.    This visit is provided free of charge (no copay) for all Medicare recipients. The clinical pharmacists at [facility] have begun to conduct these Wellness Visits which will also include a thorough review of all your medications.    As you primary medical provider recommend that you make an appointment for your  Annual Wellness Visit if you have not done so already this year.  You may set up this appointment before you leave today or you may call back ([phone]) and schedule an appointment.  Please make sure when you call that you mention that you are scheduling your Annual Wellness Visit with the clinical pharmacist so that the appointment may be made for the proper length of time.       Continue current medications. Continue good therapeutic lifestyle changes which include good diet and exercise. Fall precautions discussed with patient. If an FOBT was given today- please return it to our front desk. If you are over 61 years old - you may need Prevnar 62 or the adult Pneumonia vaccine.  Flu Shots will be available at our office starting mid- September. Please call and schedule a FLU CLINIC APPOINTMENT.   Check blood pressures and blood sugars more regularly Please call back and let us know what kind of strip she need for [place] to call in so that you can check her blood  sugars more regularly Drink plenty of fluids  Follow-up visits with urology, neurology, cardiology, and interventional radiology.   Arrie Senate MD

## 2014-07-07 ENCOUNTER — Other Ambulatory Visit: Payer: Self-pay | Admitting: *Deleted

## 2014-07-07 LAB — BMP8+EGFR
BUN/Creatinine Ratio: 8 — ABNORMAL LOW (ref 11–26)
BUN: 7 mg/dL — ABNORMAL LOW (ref 8–27)
CO2: 28 mmol/L (ref 18–29)
Calcium: 10 mg/dL (ref 8.7–10.3)
Chloride: 94 mmol/L — ABNORMAL LOW (ref 97–108)
Creatinine, Ser: 0.9 mg/dL (ref 0.57–1.00)
GFR calc Af Amer: 74 mL/min/{1.73_m2} (ref >59)
GFR calc non Af Amer: 65 mL/min/{1.73_m2} (ref >59)
Glucose: 111 mg/dL — ABNORMAL HIGH (ref 65–99)
Potassium: 3.8 mmol/L (ref 3.5–5.2)
Sodium: 139 mmol/L (ref 134–144)

## 2014-07-07 LAB — HEPATIC FUNCTION PANEL
ALT: 60 IU/L — ABNORMAL HIGH (ref 0–32)
AST: 53 IU/L — ABNORMAL HIGH (ref 0–40)
Albumin: 4.3 g/dL (ref 3.5–4.8)
Alkaline Phosphatase: 88 IU/L (ref 39–117)
Bilirubin, Direct: 0.12 mg/dL (ref 0.00–0.40)
Total Bilirubin: 0.4 mg/dL (ref 0.0–1.2)
Total Protein: 7.4 g/dL (ref 6.0–8.5)

## 2014-07-07 LAB — NMR, LIPOPROFILE
Cholesterol: 284 mg/dL — ABNORMAL HIGH (ref 100–199)
HDL Cholesterol by NMR: 50 mg/dL (ref >39)
HDL Particle Number: 26.5 umol/L — ABNORMAL LOW (ref >=30.5)
LDL Particle Number: 2655 nmol/L — ABNORMAL HIGH (ref <1000)
LDL Size: 21.3 nm (ref >20.5)
LDL-C: 208 mg/dL — ABNORMAL HIGH (ref 0–99)
LP-IR Score: 39 (ref <=45)
Small LDL Particle Number: 1046 nmol/L — ABNORMAL HIGH (ref <=527)
Triglycerides by NMR: 129 mg/dL (ref 0–149)

## 2014-07-07 LAB — MICROALBUMIN, URINE: Microalbumin, Urine: 21.4 ug/mL — ABNORMAL HIGH (ref 0.0–17.0)

## 2014-07-07 LAB — VITAMIN D 25 HYDROXY (VIT D DEFICIENCY, FRACTURES): Vit D, 25-Hydroxy: 15.8 ng/mL — ABNORMAL LOW (ref 30.0–100.0)

## 2014-07-07 MED ORDER — VITAMIN D (ERGOCALCIFEROL) 1.25 MG (50000 UNIT) PO CAPS: 50000.0000 [IU] | ORAL_CAPSULE | ORAL | Status: DC

## 2014-07-13 ENCOUNTER — Telehealth: Payer: Self-pay | Admitting: Family Medicine

## 2014-07-13 ENCOUNTER — Other Ambulatory Visit: Payer: Self-pay | Admitting: Family Medicine

## 2014-07-13 MED ORDER — AZITHROMYCIN 250 MG PO TABS: ORAL_TABLET | ORAL | Status: DC

## 2014-07-13 MED ORDER — GLUCOSE BLOOD VI STRP: ORAL_STRIP | Status: DC

## 2014-07-13 NOTE — Telephone Encounter (Signed)
Prescriptions sent electronically. Pt called.

## 2014-07-13 NOTE — Telephone Encounter (Signed)
Please review

## 2014-07-13 NOTE — Telephone Encounter (Signed)
Please call and a Z-Pak for this patient and make sure that she can get her blood sugar testing strips at her drugstore

## 2014-07-21 ENCOUNTER — Other Ambulatory Visit: Payer: Self-pay | Admitting: Family Medicine

## 2014-07-26 ENCOUNTER — Ambulatory Visit: Payer: Medicare Other | Admitting: Cardiology

## 2014-07-26 NOTE — Progress Notes (Unsigned)
Clinical Summary [name] is a 73 y.o.female seen today for follow up of the following medical problems.  1. CAD - prior cath showed mild non-obstructive disease in 2006 - denies any chest pain. No SOB or DOE. Fairly sedentary lifestyle, denies any significant symptoms at her level of her exertion - compliant with meds: ASA 325 (her preference is high dose), plavix (on for stroke by neurologist), pitavastatin. She has allergies to ARBs and ACE-I.   2. HTN - checks bp at home weekly. Typically around 110s/60s - compliant with meds, not on ACE or ARB due to allergy  3. Hyperlipidemia - currently on pitavastatin, previously on crestor but had muscle aches. Managed by Dr Elizabeth [name] her PCP - 06/2013 HDL 52 TG 189 TC 303 LDL 213    Past Medical History  Diagnosis Date  . Diarrhea   . Obesity, unspecified   . Other and unspecified hyperlipidemia   . Unspecified essential hypertension   . Family history of malignant neoplasm of gastrointestinal tract   . Esophageal reflux   . Stricture and stenosis of esophagus   . Diverticulosis of colon (without mention of hemorrhage)   . Personal history of colonic polyps   . Active smoker   . Unspecified asthma(493.90)   . Type II or unspecified type diabetes mellitus without mention of complication, not stated as uncontrolled   . Chronic airway obstruction, not elsewhere classified   . Headache(784.0)     Irregular  . Anxiety     Takes Xanax for anxiety     Allergies  Allergen Reactions  . Banana Anaphylaxis  . Actos [Pioglitazone] Swelling  . Benicar [Olmesartan Medoxomil] Other (See Comments)    Feel bad  . Chantix [Varenicline] Other (See Comments)    "Worked opposite."  . Codeine Nausea And Vomiting  . Diovan [Valsartan] Other (See Comments)    Feel bad  . Latex     REACTION: red rash  . Lipitor [Atorvastatin] Other (See Comments)    REACTION:  Joint pain  . Spinach     Stomach problems  . Vicodin  [Hydrocodone-Acetaminophen] Nausea And Vomiting  . Zocor [Simvastatin] Other (See Comments)    REACTION:  "made liver function incorrectly"  . Ace Inhibitors Cough  . Celebrex [Celecoxib] Rash  . Penicillins Rash  . Sulfonamide Derivatives Rash     Current Outpatient Prescriptions  Medication Sig Dispense Refill  . ALPRAZolam (XANAX) 0.25 MG tablet TAKE ONE TABLET BY MOUTH TWICE DAILY AS NEEDED 60 tablet 1  . amLODipine (NORVASC) 10 MG tablet TAKE ONE TABLET BY MOUTH ONCE DAILY 90 tablet 1  . aspirin EC 325 MG tablet Take 325 mg by mouth daily.    Marland Kitchen azithromycin (ZITHROMAX Z-PAK) 250 MG tablet Take 2 tablets on first day, then one daily. 6 each 0  . cholecalciferol (VITAMIN D) 1000 UNITS tablet Take 1,000 Units by mouth daily.    . clopidogrel (PLAVIX) 75 MG tablet TAKE ONE TABLET BY MOUTH ONCE DAILY 90 tablet 0  . escitalopram (LEXAPRO) 20 MG tablet TAKE ONE TABLET BY MOUTH ONCE DAILY AS DIRECTED 90 tablet 3  . fluticasone (FLONASE) 50 MCG/ACT nasal spray USE ONE SPRAY(S) INTO THE NOSE DAILY 16 g 4  . gabapentin (NEURONTIN) 100 MG capsule Take 3 capsules (300 mg total) by mouth at bedtime. As directed 90 capsule 3  . glucose blood test strip Use as instructed 100 each 12  . hydrochlorothiazide (MICROZIDE) 12.5 MG capsule TAKE ONE CAPSULE BY MOUTH ONCE  DAILY 90 capsule 0  . JANUVIA 50 MG tablet TAKE ONE TABLET BY MOUTH ONCE DAILY 90 tablet 0  . metFORMIN (GLUCOPHAGE) 1000 MG tablet TAKE ONE TABLET BY MOUTH IN THE MORNING WITH BREAKFAST 90 tablet 0  . metoCLOPramide (REGLAN) 10 MG tablet TAKE ONE TABLET BY MOUTH 2 TIMES DAILY    . oxybutynin (DITROPAN XL) 15 MG 24 hr tablet Take 1 tablet (15 mg total) by mouth every morning. 90 tablet 1  . pantoprazole (PROTONIX) 40 MG tablet TAKE ONE TABLET BY MOUTH ONCE DAILY 90 tablet 0  . traMADol (ULTRAM) 50 MG tablet Take 0.5-1 tablets (25-50 mg total) by mouth daily as needed (for body pain or headache). 30 tablet 0  . Vitamin D, Ergocalciferol,  (DRISDOL) 50000 UNITS CAPS capsule Take 1 capsule (50,000 Units total) by mouth every 7 (seven) days. 12 capsule 0  . [DISCONTINUED] oxybutynin (DITROPAN XL) 15 MG 24 hr tablet Take 15 mg by mouth every morning.     . [DISCONTINUED] ramipril (ALTACE) 10 MG capsule Take 10 mg by mouth daily.       No current facility-administered medications for this visit.     Past Surgical History  Procedure Laterality Date  . Total abdominal hysterectomy    . Tonsillectomy    . Lumbar disc surgery    . Knee arthroscopy      left  . Appendectomy    . Cardiac catheterization  2006    LAD: 30%, RCA : 20%, normal EF, elevated LVEDP  . Cardiac catheterization    . Back surgery      Spinal surgery     Allergies  Allergen Reactions  . Banana Anaphylaxis  . Actos [Pioglitazone] Swelling  . Benicar [Olmesartan Medoxomil] Other (See Comments)    Feel bad  . Chantix [Varenicline] Other (See Comments)    "Worked opposite."  . Codeine Nausea And Vomiting  . Diovan [Valsartan] Other (See Comments)    Feel bad  . Latex     REACTION: red rash  . Lipitor [Atorvastatin] Other (See Comments)    REACTION:  Joint pain  . Spinach     Stomach problems  . Vicodin [Hydrocodone-Acetaminophen] Nausea And Vomiting  . Zocor [Simvastatin] Other (See Comments)    REACTION:  "made liver function incorrectly"  . Ace Inhibitors Cough  . Celebrex [Celecoxib] Rash  . Penicillins Rash  . Sulfonamide Derivatives Rash      Family History  Problem Relation Age of Onset  . Colon cancer Other     first cousin, paternal aunt and uncle  . Cancer Paternal Grandmother     gastric  . Breast cancer Maternal Aunt     several paternal cousins  . Diabetes Mother   . Heart disease Mother   . Heart disease Maternal Grandmother   . Thyroid cancer Other     aunt  . Thyroid disease Sister     uncertain type-not cancer     Social History [name] reports that she quit smoking about 2 years ago. Her smoking use  included Cigarettes. She smoked 0.00 packs per day for 20 years. She has never used smokeless tobacco. [name] reports that she does not drink alcohol.   Review of Systems CONSTITUTIONAL: No weight loss, fever, chills, weakness or fatigue.  HEENT: Eyes: No visual loss, blurred vision, double vision or yellow sclerae.No hearing loss, sneezing, congestion, runny nose or sore throat.  SKIN: No rash or itching.  CARDIOVASCULAR:  RESPIRATORY: No shortness of breath,  cough or sputum.  GASTROINTESTINAL: No anorexia, nausea, vomiting or diarrhea. No abdominal pain or blood.  GENITOURINARY: No burning on urination, no polyuria NEUROLOGICAL: No headache, dizziness, syncope, paralysis, ataxia, numbness or tingling in the extremities. No change in bowel or bladder control.  MUSCULOSKELETAL: No muscle, back pain, joint pain or stiffness.  LYMPHATICS: No enlarged nodes. No history of splenectomy.  PSYCHIATRIC: No history of depression or anxiety.  ENDOCRINOLOGIC: No reports of sweating, cold or heat intolerance. No polyuria or polydipsia.  Marland Kitchen   Physical Examination There were no vitals filed for this visit. There were no vitals filed for this visit.  Gen: resting comfortably, no acute distress HEENT: no scleral icterus, pupils equal round and reactive, no palptable cervical adenopathy,  CV Resp: Clear to auscultation bilaterally GI: abdomen is soft, non-tender, non-distended, normal bowel sounds, no hepatosplenomegaly MSK: extremities are warm, no edema.  Skin: warm, no rash Neuro:  no focal deficits Psych: appropriate affect   Diagnostic Studies  02/2012 Echo: LVEF 60-65%, mild LVH, no WMAs, grade I diastolic dysfunction,   02/1606 Cath DATE OF PROCEDURE: 12/26/2004  DATE OF DISCHARGE: 12/26/2004  CARDIAC CATHETERIZATION  INDICATIONS: [name] is a pleasant 73 year old woman with a history of  hypertension, dyslipidemia, type 2 diabetes mellitus, tobacco use, and  asthma. She has  had recent problems with mild dyspnea and lower extremity  edema. Given her cardiac risk factor profile, she is referred for diagnostic  coronary angiography and left ventriculography to assess coronary anatomy  and left ventricular function.  PROCEDURE:  1. Left heart catheterization.  2. Selective coronary angiography.  3. Left ventriculography.  DESCRIPTION OF PROCEDURE: The area about the right femoral artery was  anesthetized 1% lidocaine and a 4-French sheath was placed in the right  femoral artery via modified Seldinger technique. Standard preformed 4-French  JL-4 and JR-4 catheters used for selective coronary angiography and angled  pigtail catheters used for left heart catheterization and left  ventriculography. All exchanges were made over wire. The patient tolerated  procedure well without immediate complications.  HEMODYNAMIC RESULTS:  1. Left ventricle 151/25 mmHg.  2. Aorta 152/76 mmHg.  ANGIOGRAPHIC FINDINGS:  1. Left main coronary artery is free of significant flow-limiting coronary  atherosclerosis.  2. The left anterior descending is a medium caliber vessel with three small  diagonal branches. There are minor Luminal irregularities noted  throughout this system with approximately 30% diffuse mid-vessel  stenosis. There is also a 30-40% ostial stenosis involving the first  diagonal branch. No flow-limiting stenoses are noted.  3. The circumflex coronary artery is a medium caliber vessel. There is a  very small branch in the AV groove and two larger bifurcating obtuse  marginal branches. Minor Luminal irregularities are noted without flow-  limiting stenoses.  4. The right coronary artery is a medium caliber vessel with posterior  descending branch. Minor Luminal irregularities are noted. There is a  20% stenosis in the proximal to mid-vessel.  LEFT VENTRICULOGRAPHY: Left ventriculography was performed in the RAO  projection  revealing an ejection fraction approximately 65% in the setting  of ventricular ectopy without significant mitral regurgitation or focal wall  motion abnormality.  DIAGNOSES:  1. Mild coronary atherosclerosis as outlined with no flow-limiting stenoses  in the major epicardial vessels.  2. Left ventricular ejection fraction of approximately 65% in the setting  of ventricular ectopy. No significant mitral regurgitation is noted. The  left ventricular end-diastolic pressure is increased at 25 mmHg. At this  point would  suspect an element of diastolic dysfunction particularly  given elevated left renal end-diastolic pressure.  The patient is already on an ACE inhibitor and diuretic. We will plan to  continue maximizing medical therapy. We will arrange a follow-up 2-D  echocardiogram for diastolic parameters and also to exclude  significantly elevated pulmonary pressures. She will have follow-up in  the office to review this and her progress.  07/26/13 Clinic EKG: sinus rhythm, normal axis, RBBB    Assessment and Plan   1. CAD - non-obstructive CAD by prior cath, continue risk factor modification  2. HTN - elevated in clinic, home values are at goal. Continue current medications, she is not on ARB or ACE due to allergy.   3. Hyperlipidemia - managed by her PCP, recent myalgias on crestor, changed to alternative statin. She will follow with her PCP, goal LDL< 17.       Arnoldo Lenis, M.D..

## 2014-08-12 ENCOUNTER — Other Ambulatory Visit: Payer: Self-pay | Admitting: Family Medicine

## 2014-08-21 ENCOUNTER — Telehealth: Payer: Self-pay | Admitting: Family Medicine

## 2014-08-21 NOTE — Telephone Encounter (Signed)
Januvia 100 mg qty 42 pills given to patient.  She is in donut hole and needed this.

## 2014-08-22 ENCOUNTER — Ambulatory Visit (INDEPENDENT_AMBULATORY_CARE_PROVIDER_SITE_OTHER): Payer: Medicare Other | Admitting: Cardiology

## 2014-08-22 ENCOUNTER — Encounter: Payer: Self-pay | Admitting: Cardiology

## 2014-08-22 VITALS — BP 125/77 | HR 66 | Ht 62.0 in | Wt 195.0 lb

## 2014-08-22 DIAGNOSIS — I2510 Atherosclerotic heart disease of native coronary artery without angina pectoris: Secondary | ICD-10-CM

## 2014-08-22 DIAGNOSIS — I10 Essential (primary) hypertension: Secondary | ICD-10-CM

## 2014-08-22 DIAGNOSIS — E785 Hyperlipidemia, unspecified: Secondary | ICD-10-CM

## 2014-08-22 MED ORDER — ROSUVASTATIN CALCIUM 20 MG PO TABS: 20.0000 mg | ORAL_TABLET | Freq: Every day | ORAL | Status: DC

## 2014-08-22 NOTE — Patient Instructions (Signed)
Continue all current medications. Your physician wants you to follow up in:  1 year.  You will receive a reminder letter in the mail one-two months in advance.  If you don't receive a letter, please call our office to schedule the follow up appointment   

## 2014-08-22 NOTE — Progress Notes (Signed)
Clinical Summary Ms. Aldrete is a 73 y.o.female seen today for follow up of the following medical problems.  1. 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 No SOB or DOE. - compliant with meds  2. HTN - checks bp at home weekly. Typically around 120s/70s - compliant with meds, not on ACE or ARB due to allergy  3. Hyperlipidemia - currently on crestor 20mg  daily, restarted 3 weeks ago. Had been stopped previously due to questionable muscle aches that did not resolve off therapy - 06/2014 LDL 208, HDL 50, TC 284   Past Medical History  Diagnosis Date  . Diarrhea   . Obesity, unspecified   . Other and unspecified hyperlipidemia   . Unspecified essential hypertension   . Family history of malignant neoplasm of gastrointestinal tract   . Esophageal reflux   . Stricture and stenosis of esophagus   . Diverticulosis of colon (without mention of hemorrhage)   . Personal history of colonic polyps   . Active smoker   . Unspecified asthma(493.90)   . Type II or unspecified type diabetes mellitus without mention of complication, not stated as uncontrolled   . Chronic airway obstruction, not elsewhere classified   . Headache(784.0)     Irregular  . Anxiety     Takes Xanax for anxiety     Allergies  Allergen Reactions  . Banana Anaphylaxis  . Actos [Pioglitazone] Swelling  . Benicar [Olmesartan Medoxomil] Other (See Comments)    Feel bad  . Chantix [Varenicline] Other (See Comments)    "Worked opposite."  . Codeine Nausea And Vomiting  . Diovan [Valsartan] Other (See Comments)    Feel bad  . Latex     REACTION: red rash  . Lipitor [Atorvastatin] Other (See Comments)    REACTION:  Joint pain  . Spinach     Stomach problems  . Vicodin [Hydrocodone-Acetaminophen] Nausea And Vomiting  . Zocor [Simvastatin] Other (See Comments)    REACTION:  "made liver function incorrectly"  . Ace Inhibitors Cough  . Celebrex  [Celecoxib] Rash  . Penicillins Rash  . Sulfonamide Derivatives Rash     Current Outpatient Prescriptions  Medication Sig Dispense Refill  . ALPRAZolam (XANAX) 0.25 MG tablet TAKE ONE TABLET BY MOUTH TWICE DAILY AS NEEDED 60 tablet 1  . amLODipine (NORVASC) 10 MG tablet TAKE ONE TABLET BY MOUTH ONCE DAILY 90 tablet 1  . aspirin EC 325 MG tablet Take 325 mg by mouth daily.    Marland Kitchen azithromycin (ZITHROMAX Z-PAK) 250 MG tablet Take 2 tablets on first day, then one daily. 6 each 0  . cholecalciferol (VITAMIN D) 1000 UNITS tablet Take 1,000 Units by mouth daily.    . clopidogrel (PLAVIX) 75 MG tablet TAKE ONE TABLET BY MOUTH ONCE DAILY 90 tablet 0  . escitalopram (LEXAPRO) 20 MG tablet TAKE ONE TABLET BY MOUTH ONCE DAILY AS DIRECTED 90 tablet 3  . fluticasone (FLONASE) 50 MCG/ACT nasal spray USE ONE SPRAY(S) INTO THE NOSE DAILY 16 g 4  . gabapentin (NEURONTIN) 100 MG capsule Take 3 capsules (300 mg total) by mouth at bedtime. As directed 90 capsule 3  . glucose blood test strip Use as instructed 100 each 12  . hydrochlorothiazide (MICROZIDE) 12.5 MG capsule TAKE ONE CAPSULE BY MOUTH ONCE DAILY 90 capsule 0  . JANUVIA 50 MG tablet TAKE ONE TABLET BY MOUTH ONCE DAILY 90 tablet 0  . metFORMIN (GLUCOPHAGE) 1000 MG tablet TAKE ONE  TABLET BY MOUTH IN THE MORNING WITH BREAKFAST 90 tablet 0  . metoCLOPramide (REGLAN) 10 MG tablet TAKE ONE TABLET BY MOUTH 2 TIMES DAILY    . oxybutynin (DITROPAN XL) 15 MG 24 hr tablet Take 1 tablet (15 mg total) by mouth every morning. 90 tablet 1  . pantoprazole (PROTONIX) 40 MG tablet TAKE ONE TABLET BY MOUTH ONCE DAILY 90 tablet 0  . traMADol (ULTRAM) 50 MG tablet Take 0.5-1 tablets (25-50 mg total) by mouth daily as needed (for body pain or headache). 30 tablet 0  . Vitamin D, Ergocalciferol, (DRISDOL) 50000 UNITS CAPS capsule Take 1 capsule (50,000 Units total) by mouth every 7 (seven) days. 12 capsule 0  . [DISCONTINUED] oxybutynin (DITROPAN XL) 15 MG 24 hr tablet Take  15 mg by mouth every morning.     . [DISCONTINUED] ramipril (ALTACE) 10 MG capsule Take 10 mg by mouth daily.       No current facility-administered medications for this visit.     Past Surgical History  Procedure Laterality Date  . Total abdominal hysterectomy    . Tonsillectomy    . Lumbar disc surgery    . Knee arthroscopy      left  . Appendectomy    . Cardiac catheterization  2006    LAD: 30%, RCA : 20%, normal EF, elevated LVEDP  . Cardiac catheterization    . Back surgery      Spinal surgery     Allergies  Allergen Reactions  . Banana Anaphylaxis  . Actos [Pioglitazone] Swelling  . Benicar [Olmesartan Medoxomil] Other (See Comments)    Feel bad  . Chantix [Varenicline] Other (See Comments)    "Worked opposite."  . Codeine Nausea And Vomiting  . Diovan [Valsartan] Other (See Comments)    Feel bad  . Latex     REACTION: red rash  . Lipitor [Atorvastatin] Other (See Comments)    REACTION:  Joint pain  . Spinach     Stomach problems  . Vicodin [Hydrocodone-Acetaminophen] Nausea And Vomiting  . Zocor [Simvastatin] Other (See Comments)    REACTION:  "made liver function incorrectly"  . Ace Inhibitors Cough  . Celebrex [Celecoxib] Rash  . Penicillins Rash  . Sulfonamide Derivatives Rash      Family History  Problem Relation Age of Onset  . Colon cancer Other     first cousin, paternal aunt and uncle  . Cancer Paternal Grandmother     gastric  . Breast cancer Maternal Aunt     several paternal cousins  . Diabetes Mother   . Heart disease Mother   . Heart disease Maternal Grandmother   . Thyroid cancer Other     aunt  . Thyroid disease Sister     uncertain type-not cancer     Social History Ms. Bednarz reports that she quit smoking about 2 years ago. Her smoking use included Cigarettes. She smoked 0.00 packs per day for 20 years. She has never used smokeless tobacco. Ms. Curbow reports that she does not drink alcohol.   Review of  Systems CONSTITUTIONAL: No weight loss, fever, chills, weakness or fatigue.  HEENT: Eyes: No visual loss, blurred vision, double vision or yellow sclerae.No hearing loss, sneezing, congestion, runny nose or sore throat.  SKIN: No rash or itching.  CARDIOVASCULAR: per HPI RESPIRATORY: No shortness of breath, cough or sputum.  GASTROINTESTINAL: No anorexia, nausea, vomiting or diarrhea. No abdominal pain or blood.  GENITOURINARY: No burning on urination, no polyuria NEUROLOGICAL: No headache, dizziness,  syncope, paralysis, ataxia, numbness or tingling in the extremities. No change in bowel or bladder control.  MUSCULOSKELETAL: No muscle, back pain, joint pain or stiffness.  LYMPHATICS: No enlarged nodes. No history of splenectomy.  PSYCHIATRIC: No history of depression or anxiety.  ENDOCRINOLOGIC: No reports of sweating, cold or heat intolerance. No polyuria or polydipsia.  Marland Kitchen   Physical Examination p 66 bp 125/77 Wt 195 lbs BMI36 Gen: resting comfortably, no acute distress HEENT: no scleral icterus, pupils equal round and reactive, no palptable cervical adenopathy,  CV: RRR, no m/r/g, no JVD, no carotid bruits Resp: Clear to auscultation bilaterally GI: abdomen is soft, non-tender, non-distended, normal bowel sounds, no hepatosplenomegaly MSK: extremities are warm, no edema.  Skin: warm, no rash Neuro:  no focal deficits Psych: appropriate affect   Diagnostic Studies 02/2012 Echo: LVEF 60-65%, mild LVH, no WMAs, grade I diastolic dysfunction,   10/4233 Cath DATE OF PROCEDURE: 12/26/2004  DATE OF DISCHARGE: 12/26/2004  CARDIAC CATHETERIZATION  INDICATIONS: Ms. Shingler is a pleasant 74 year old woman with a history of  hypertension, dyslipidemia, type 2 diabetes mellitus, tobacco use, and  asthma. She has had recent problems with mild dyspnea and lower extremity  edema. Given her cardiac risk factor profile, she is referred for diagnostic  coronary angiography and left  ventriculography to assess coronary anatomy  and left ventricular function.  PROCEDURE:  1. Left heart catheterization.  2. Selective coronary angiography.  3. Left ventriculography.  DESCRIPTION OF PROCEDURE: The area about the right femoral artery was  anesthetized 1% lidocaine and a 4-French sheath was placed in the right  femoral artery via modified Seldinger technique. Standard preformed 4-French  JL-4 and JR-4 catheters used for selective coronary angiography and angled  pigtail catheters used for left heart catheterization and left  ventriculography. All exchanges were made over wire. The patient tolerated  procedure well without immediate complications.  HEMODYNAMIC RESULTS:  1. Left ventricle 151/25 mmHg.  2. Aorta 152/76 mmHg.  ANGIOGRAPHIC FINDINGS:  1. Left main coronary artery is free of significant flow-limiting coronary  atherosclerosis.  2. The left anterior descending is a medium caliber vessel with three small  diagonal branches. There are minor Luminal irregularities noted  throughout this system with approximately 30% diffuse mid-vessel  stenosis. There is also a 30-40% ostial stenosis involving the first  diagonal branch. No flow-limiting stenoses are noted.  3. The circumflex coronary artery is a medium caliber vessel. There is a  very small branch in the AV groove and two larger bifurcating obtuse  marginal branches. Minor Luminal irregularities are noted without flow-  limiting stenoses.  4. The right coronary artery is a medium caliber vessel with posterior  descending branch. Minor Luminal irregularities are noted. There is a  20% stenosis in the proximal to mid-vessel.  LEFT VENTRICULOGRAPHY: Left ventriculography was performed in the RAO  projection revealing an ejection fraction approximately 65% in the setting  of ventricular ectopy without significant mitral regurgitation or focal wall  motion abnormality.   DIAGNOSES:  1. Mild coronary atherosclerosis as outlined with no flow-limiting stenoses  in the major epicardial vessels.  2. Left ventricular ejection fraction of approximately 65% in the setting  of ventricular ectopy. No significant mitral regurgitation is noted. The  left ventricular end-diastolic pressure is increased at 25 mmHg. At this  point would suspect an element of diastolic dysfunction particularly  given elevated left renal end-diastolic pressure.  The patient is already on an ACE inhibitor and diuretic. We will plan to  continue maximizing medical therapy. We will arrange a follow-up 2-D  echocardiogram for diastolic parameters and also to exclude  significantly elevated pulmonary pressures. She will have follow-up in  the office to review this and her progress.  07/26/13 Clinic EKG: sinus rhythm, normal axis, RBBB     Assessment and Plan   1. Non-obstructive CAD - no current symptoms, continue risk factor modification  2. HTN - At goal.  Continue current medications, she is not on ARB or ACE due to allergy.   3. Hyperlipidemia - followed by pcp, she has been restarted on crestor 20mg  daily.    F/u 1 year  Arnoldo Lenis, M.D.

## 2014-08-28 ENCOUNTER — Other Ambulatory Visit: Payer: Self-pay | Admitting: Family Medicine

## 2014-09-15 ENCOUNTER — Ambulatory Visit: Payer: Self-pay | Admitting: Urology

## 2014-09-26 ENCOUNTER — Ambulatory Visit: Payer: Medicare Other | Admitting: Family

## 2014-09-27 ENCOUNTER — Other Ambulatory Visit: Payer: Self-pay | Admitting: Family Medicine

## 2014-09-28 NOTE — Telephone Encounter (Signed)
Patient aware.

## 2014-10-02 ENCOUNTER — Other Ambulatory Visit: Payer: Self-pay | Admitting: Family Medicine

## 2014-10-03 NOTE — Telephone Encounter (Signed)
Last seen 07/06/14 [facility]  If approved route to nurse to call into [place]  [phone]

## 2014-11-06 DIAGNOSIS — H2511 Age-related nuclear cataract, right eye: Secondary | ICD-10-CM | POA: Diagnosis not present

## 2014-11-06 DIAGNOSIS — H35033 Hypertensive retinopathy, bilateral: Secondary | ICD-10-CM | POA: Diagnosis not present

## 2014-11-06 DIAGNOSIS — E119 Type 2 diabetes mellitus without complications: Secondary | ICD-10-CM | POA: Diagnosis not present

## 2014-11-06 DIAGNOSIS — H25011 Cortical age-related cataract, right eye: Secondary | ICD-10-CM | POA: Diagnosis not present

## 2014-11-08 ENCOUNTER — Other Ambulatory Visit: Payer: Self-pay | Admitting: Family

## 2014-11-08 NOTE — Telephone Encounter (Signed)
Rx sent to [facility] by Dr Redge Gainer.

## 2014-11-25 ENCOUNTER — Other Ambulatory Visit: Payer: Self-pay | Admitting: *Deleted

## 2014-11-25 MED ORDER — AMLODIPINE BESYLATE 10 MG PO TABS: 10.0000 mg | ORAL_TABLET | Freq: Every day | ORAL | Status: DC

## 2014-11-25 MED ORDER — CLOPIDOGREL BISULFATE 75 MG PO TABS: 75.0000 mg | ORAL_TABLET | Freq: Every day | ORAL | Status: DC

## 2014-11-25 MED ORDER — OXYBUTYNIN CHLORIDE ER 15 MG PO TB24: 15.0000 mg | ORAL_TABLET | Freq: Every morning | ORAL | Status: DC

## 2014-11-25 MED ORDER — GABAPENTIN 100 MG PO CAPS: 300.0000 mg | ORAL_CAPSULE | Freq: Every day | ORAL | Status: DC

## 2014-11-25 MED ORDER — METFORMIN HCL 1000 MG PO TABS: ORAL_TABLET | ORAL | Status: DC

## 2014-11-25 MED ORDER — PANTOPRAZOLE SODIUM 40 MG PO TBEC: 40.0000 mg | DELAYED_RELEASE_TABLET | Freq: Every day | ORAL | Status: DC

## 2014-11-27 ENCOUNTER — Encounter: Payer: Self-pay | Admitting: Family Medicine

## 2014-11-27 ENCOUNTER — Ambulatory Visit (INDEPENDENT_AMBULATORY_CARE_PROVIDER_SITE_OTHER): Payer: Medicare Other | Admitting: Family Medicine

## 2014-11-27 VITALS — BP 118/63 | HR 82 | Temp 96.7°F | Ht 62.0 in | Wt 195.0 lb

## 2014-11-27 DIAGNOSIS — M25551 Pain in right hip: Secondary | ICD-10-CM | POA: Diagnosis not present

## 2014-11-27 DIAGNOSIS — E079 Disorder of thyroid, unspecified: Secondary | ICD-10-CM

## 2014-11-27 DIAGNOSIS — I2510 Atherosclerotic heart disease of native coronary artery without angina pectoris: Secondary | ICD-10-CM | POA: Diagnosis not present

## 2014-11-27 DIAGNOSIS — K219 Gastro-esophageal reflux disease without esophagitis: Secondary | ICD-10-CM

## 2014-11-27 DIAGNOSIS — R638 Other symptoms and signs concerning food and fluid intake: Secondary | ICD-10-CM | POA: Diagnosis not present

## 2014-11-27 DIAGNOSIS — E785 Hyperlipidemia, unspecified: Secondary | ICD-10-CM | POA: Diagnosis not present

## 2014-11-27 DIAGNOSIS — Z13820 Encounter for screening for osteoporosis: Secondary | ICD-10-CM

## 2014-11-27 DIAGNOSIS — E118 Type 2 diabetes mellitus with unspecified complications: Secondary | ICD-10-CM

## 2014-11-27 DIAGNOSIS — E1140 Type 2 diabetes mellitus with diabetic neuropathy, unspecified: Secondary | ICD-10-CM | POA: Diagnosis not present

## 2014-11-27 DIAGNOSIS — G8929 Other chronic pain: Secondary | ICD-10-CM | POA: Diagnosis not present

## 2014-11-27 DIAGNOSIS — E559 Vitamin D deficiency, unspecified: Secondary | ICD-10-CM

## 2014-11-27 DIAGNOSIS — E1142 Type 2 diabetes mellitus with diabetic polyneuropathy: Secondary | ICD-10-CM

## 2014-11-27 DIAGNOSIS — I10 Essential (primary) hypertension: Secondary | ICD-10-CM

## 2014-11-27 LAB — POCT CBC
Granulocyte percent: 69.7 %G (ref 37–80)
HCT, POC: 41.3 % (ref 37.7–47.9)
Hemoglobin: 12.5 g/dL (ref 12.2–16.2)
Lymph, poc: 1.7 (ref 0.6–3.4)
MCH, POC: 24.5 pg — AB (ref 27–31.2)
MCHC: 30.2 g/dL — AB (ref 31.8–35.4)
MCV: 80.8 fL (ref 80–97)
MPV: 7.5 fL (ref 0–99.8)
POC Granulocyte: 4.5 (ref 2–6.9)
POC LYMPH PERCENT: 25.4 %L (ref 10–50)
Platelet Count, POC: 369 10*3/uL (ref 142–424)
RBC: 5.11 M/uL (ref 4.04–5.48)
RDW, POC: 14.8 %
WBC: 6.5 10*3/uL (ref 4.6–10.2)

## 2014-11-27 LAB — POCT GLYCOSYLATED HEMOGLOBIN (HGB A1C): Hemoglobin A1C: 6.1

## 2014-11-27 MED ORDER — GLUCOSE BLOOD VI STRP: ORAL_STRIP | Status: DC

## 2014-11-27 MED ORDER — ALPRAZOLAM 0.25 MG PO TABS: 0.2500 mg | ORAL_TABLET | Freq: Two times a day (BID) | ORAL | Status: DC | PRN

## 2014-11-27 MED ORDER — SITAGLIPTIN PHOSPHATE 50 MG PO TABS: 50.0000 mg | ORAL_TABLET | Freq: Every day | ORAL | Status: DC

## 2014-11-27 MED ORDER — GABAPENTIN 300 MG PO CAPS: 300.0000 mg | ORAL_CAPSULE | Freq: Every day | ORAL | Status: DC

## 2014-11-27 MED ORDER — METFORMIN HCL 1000 MG PO TABS: ORAL_TABLET | ORAL | Status: DC

## 2014-11-27 NOTE — Patient Instructions (Addendum)
Medicare Annual Wellness Visit  [facility] and the medical providers at [facility] strive to bring you the best medical care.  In doing so we not only want to address your current medical conditions and concerns but also to detect new conditions early and prevent illness, disease and health-related problems.    Medicare offers a yearly Wellness Visit which allows our clinical staff to assess your need for preventative services including immunizations, lifestyle education, counseling to decrease risk of preventable diseases and screening for fall risk and other medical concerns.    This visit is provided free of charge (no copay) for all Medicare recipients. The clinical pharmacists at [facility] have begun to conduct these Wellness Visits which will also include a thorough review of all your medications.    As you primary medical provider recommend that you make an appointment for your Annual Wellness Visit if you have not done so already this year.  You may set up this appointment before you leave today or you may call back ([phone]) and schedule an appointment.  Please make sure when you call that you mention that you are scheduling your Annual Wellness Visit with the clinical pharmacist so that the appointment may be made for the proper length of time.     Continue current medications. Continue good therapeutic lifestyle changes which include good diet and exercise. Fall precautions discussed with patient. If an FOBT was given today- please return it to our front desk. If you are over 33 years old - you may need Prevnar 79 or the adult Pneumonia vaccine.  Flu Shots are still available at our office. If you still haven't had one please call to set up a nurse visit to get one.   After your visit with [place] today you will receive a survey in the mail or online from [employer] regarding your care with [place]. Please take a moment to  fill this out. Your feedback is very important to [place] as you can help [place] better understand your patient needs as well as improve your experience and satisfaction. WE CARE ABOUT YOU!!!  The patient should continue to monitor her blood pressures and try to do better with monitoring her blood sugars at home. She should continue with an aggressive exercise regimen and drink plenty of water.

## 2014-11-27 NOTE — Progress Notes (Addendum)
Subjective:    Patient ID: Elizabeth [name], female    DOB: 07-21-1941, 74 y.o.   MRN: [id number]  HPI Pt here for follow up and management of chronic medical problems which includes diabetes, hypertension, and hyperlipidemia. She is taking medications regularly. The patient comes today for her regular visit and has no complaints. She is to get her lab work today for monitoring her medical conditions and is due to get an FOBT. She is requesting refills on her blood sugar medicine and the medicine for her diabetic neuropathy as well as her test strips. The patient is due to get cataract surgery in April. This is from Dr. Herbert Deaner. She is following her diet as closely as possible but indicates she can't get a lot of exercise because of the right hip pain. She cannot lay on the right hip. She saw the orthopedic surgeon PA recently and they recommended therapy but she could not do this because of the expense. She denies chest pain or shortness of breath or GI symptoms. She is also having no trouble passing her water. She has a past history of a CVA and she sees the neurologist about this. She has trouble with using her right hand as far as writing is concerned and the neurologist thinks that this is related to the stroke.      Patient Active Problem List   Diagnosis Date Noted  . CVA (cerebral vascular accident) 02/27/2012  . Cerebral aneurysm 02/27/2012  . 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 Gait instability 02/27/2012  . Vision disturbance S/P CVA (cerebrovascular accident) 02/27/2012  . Dyspnea 11/14/2011  . Thyroid disease 10/12/2008  . Diabetes 08/29/2008  . Hyperlipidemia 08/29/2008  . OBESITY, UNSPECIFIED 08/29/2008  . COPD 08/29/2008  . WEIGHT LOSS 08/29/2008  . DIARRHEA 08/29/2008  . HTN (hypertension) 08/28/2008  . ASTHMA 08/28/2008  . ESOPHAGEAL STRICTURE 08/28/2008  . GERD 08/28/2008  . HIATAL HERNIA 08/28/2008  . DIVERTICULOSIS, COLON 08/28/2008  . COLONIC POLYPS, HX OF  08/28/2008   Outpatient Encounter Prescriptions as of 11/27/2014  Medication Sig  . ALPRAZolam (XANAX) 0.25 MG tablet TAKE ONE TABLET BY MOUTH TWICE DAILY AS NEEDED  . amLODipine (NORVASC) 10 MG tablet Take 1 tablet (10 mg total) by mouth daily.  . clopidogrel (PLAVIX) 75 MG tablet Take 1 tablet (75 mg total) by mouth daily.  Marland Kitchen escitalopram (LEXAPRO) 20 MG tablet TAKE ONE TABLET BY MOUTH ONCE DAILY AS DIRECTED  . fluticasone (FLONASE) 50 MCG/ACT nasal spray USE ONE SPRAY(S) INTO THE NOSE DAILY  . gabapentin (NEURONTIN) 100 MG capsule Take 3 capsules (300 mg total) by mouth at bedtime. As directed  . glucose blood test strip Use as instructed  . hydrochlorothiazide (MICROZIDE) 12.5 MG capsule TAKE ONE CAPSULE BY MOUTH ONCE DAILY  . JANUVIA 50 MG tablet TAKE ONE TABLET BY MOUTH ONCE DAILY  . metFORMIN (GLUCOPHAGE) 1000 MG tablet TAKE ONE TABLET BY MOUTH IN THE MORNING WITH BREAKFAST  . metoCLOPramide (REGLAN) 10 MG tablet TAKE ONE TABLET BY MOUTH 2 TIMES DAILY  . oxybutynin (DITROPAN XL) 15 MG 24 hr tablet Take 1 tablet (15 mg total) by mouth every morning.  . pantoprazole (PROTONIX) 40 MG tablet Take 1 tablet (40 mg total) by mouth daily.  . rosuvastatin (CRESTOR) 20 MG tablet Take 1 tablet (20 mg total) by mouth daily.  . [DISCONTINUED] traMADol (ULTRAM) 50 MG tablet Take 0.5-1 tablets (25-50 mg total) by mouth daily as needed (for body pain or headache).  . [  DISCONTINUED] Vitamin D, Ergocalciferol, (DRISDOL) 50000 UNITS CAPS capsule Take 1 capsule (50,000 Units total) by mouth every 7 (seven) days.    Review of Systems  Constitutional: Negative.   HENT: Negative.   Eyes: Negative.   Respiratory: Negative.   Cardiovascular: Negative.   Gastrointestinal: Negative.   Endocrine: Negative.   Genitourinary: Negative.   Musculoskeletal: Negative.   Skin: Negative.   Allergic/Immunologic: Negative.   Neurological: Negative.   Hematological: Negative.   Psychiatric/Behavioral: Negative.          Objective:   Physical Exam  Constitutional: She is oriented to person, place, and time. She appears well-developed and well-nourished. No distress.  HENT:  Head: Normocephalic and atraumatic.  Right Ear: External ear normal.  Left Ear: External ear normal.  Nose: Nose normal.  Mouth/Throat: Oropharynx is clear and moist.  Minimal nasal congestion  Eyes: Conjunctivae and EOM are normal. Pupils are equal, round, and reactive to light. Right eye exhibits no discharge. Left eye exhibits no discharge. No scleral icterus.  Neck: Normal range of motion. Neck supple. No thyromegaly present.  No bruits, no thyromegaly and no anterior cervical nodes  Cardiovascular: Normal rate, regular rhythm, normal heart sounds and intact distal pulses.  Exam reveals no gallop and no friction rub.   No murmur heard. The heart has a regular rate and rhythm at 72/m  Pulmonary/Chest: Effort normal and breath sounds normal. No respiratory distress. She has no wheezes. She has no rales. She exhibits no tenderness.  Lungs are clear anteriorly and posteriorly  Abdominal: Soft. Bowel sounds are normal. She exhibits no mass. There is no tenderness. There is no rebound and no guarding.  The abdomen is obese without masses or tenderness  Musculoskeletal: Normal range of motion. She exhibits tenderness. She exhibits no edema.  Is some tenderness in the right hip and there is a lump which is present which she is complaining with in the upper outer aspect of the right thigh. She says this is been present since she fell several years ago.  Lymphadenopathy:    She has no cervical adenopathy.  Neurological: She is alert and oriented to person, place, and time. She has normal reflexes. No cranial nerve deficit.  Skin: Skin is warm and dry. No rash noted.  Dry skin in general  Psychiatric: She has a normal mood and affect. Her behavior is normal. Judgment and thought content normal.  Nursing note and vitals  reviewed.  BP 118/63 mmHg  Pulse 82  Temp(Src) 96.7 F (35.9 C) (Oral)  Ht '5\' 2"'  (1.575 m)  Wt 195 lb (88.451 kg)  BMI 35.66 kg/m2  60 mg of Depo-Medrol injected into the right hip and an area of point tenderness. This was done without complication.      Assessment & Plan:  1. Type 2 diabetes mellitus with complication -The patient is trying to watch her diet but she has noted some test strips and will be given a prescription for them today to help monitor her blood sugars more closely at home. - POCT CBC - BMP8+EGFR - POCT glycosylated hemoglobin (Hb A1C)  2. Gastroesophageal reflux disease, esophagitis presence not specified -She is having no problems with this at the present time - POCT CBC - Hepatic function panel  3. Hyperlipidemia -She will continue with her current treatment and watching her diet closely and any changes in treatment will be determined by lab work is being drawn today. - POCT CBC - Lipid panel  4. Thyroid disease -She is  having no symptoms with her thyroid - POCT CBC - Thyroid Panel With TSH  5. Essential hypertension -The blood pressure is under good control and she says the numbers at home have been in the 120s over less than 80. - POCT CBC - BMP8+EGFR - Hepatic function panel  6. Vitamin D deficiency -She should continue with her current treatment pending results of lab work being done today - POCT CBC - Vit D  25 hydroxy (rtn osteoporosis monitoring)  7. Screening for osteoporosis -She is having no problems with falls or fractures or back pain. - POCT CBC  8. ASCVD (arteriosclerotic cardiovascular disease) -She will continue regular follow-up with cardiology - POCT CBC - BMP8+EGFR - Hepatic function panel - Lipid panel  9. Type 2 diabetes mellitus with peripheral neuropathy -She is requesting a refill on her gabapentin and we will do this for her today.  10. Chronic right hip pain -We will inject the right hip with 60 of  Depo-Medrol -Previous x-rays were reviewed and there was no specific abnormality present  11. Increased BMI (body mass index) -We will offer her a visit with the clinical pharmacist for diet education  Meds ordered this encounter  Medications  . metFORMIN (GLUCOPHAGE) 1000 MG tablet    Sig: TAKE ONE TABLET BY MOUTH IN THE MORNING WITH BREAKFAST    Dispense:  90 tablet    Refill:  3  . sitaGLIPtin (JANUVIA) 50 MG tablet    Sig: Take 1 tablet (50 mg total) by mouth daily.    Dispense:  90 tablet    Refill:  3  . ALPRAZolam (XANAX) 0.25 MG tablet    Sig: Take 1 tablet (0.25 mg total) by mouth 2 (two) times daily as needed.    Dispense:  60 tablet    Refill:  1  . glucose blood test strip    Sig: Check BS BID and PRN. DX E11.9    Dispense:  100 each    Refill:  11    Patient uses a free style glucometer  . gabapentin (NEURONTIN) 300 MG capsule    Sig: Take 1 capsule (300 mg total) by mouth at bedtime. As directed    Dispense:  90 capsule    Refill:  1   Patient Instructions                       Medicare Annual Wellness Visit  [facility] and the medical providers at [facility] strive to bring you the best medical care.  In doing so we not only want to address your current medical conditions and concerns but also to detect new conditions early and prevent illness, disease and health-related problems.    Medicare offers a yearly Wellness Visit which allows our clinical staff to assess your need for preventative services including immunizations, lifestyle education, counseling to decrease risk of preventable diseases and screening for fall risk and other medical concerns.    This visit is provided free of charge (no copay) for all Medicare recipients. The clinical pharmacists at [facility] have begun to conduct these Wellness Visits which will also include a thorough review of all your medications.    As you primary medical provider  recommend that you make an appointment for your Annual Wellness Visit if you have not done so already this year.  You may set up this appointment before you leave today or you may call back ([phone]) and schedule an appointment.  Please make sure when you call that you mention that you are scheduling your Annual Wellness Visit with the clinical pharmacist so that the appointment may be made for the proper length of time.     Continue current medications. Continue good therapeutic lifestyle changes which include good diet and exercise. Fall precautions discussed with patient. If an FOBT was given today- please return it to our front desk. If you are over 96 years old - you may need Prevnar 72 or the adult Pneumonia vaccine.  Flu Shots are still available at our office. If you still haven't had one please call to set up a nurse visit to get one.   After your visit with [place] today you will receive a survey in the mail or online from [employer] regarding your care with [place]. Please take a moment to fill this out. Your feedback is very important to [place] as you can help [place] better understand your patient needs as well as improve your experience and satisfaction. WE CARE ABOUT YOU!!!  The patient should continue to monitor her blood pressures and try to do better with monitoring her blood sugars at home. She should continue with an aggressive exercise regimen and drink plenty of water.   Arrie Senate MD

## 2014-11-28 LAB — BMP8+EGFR
BUN/Creatinine Ratio: 10 — ABNORMAL LOW (ref 11–26)
BUN: 8 mg/dL (ref 8–27)
CO2: 22 mmol/L (ref 18–29)
Calcium: 10.2 mg/dL (ref 8.7–10.3)
Chloride: 95 mmol/L — ABNORMAL LOW (ref 97–108)
Creatinine, Ser: 0.8 mg/dL (ref 0.57–1.00)
GFR calc Af Amer: 86 mL/min/{1.73_m2} (ref >59)
GFR calc non Af Amer: 74 mL/min/{1.73_m2} (ref >59)
Glucose: 147 mg/dL — ABNORMAL HIGH (ref 65–99)
Potassium: 3.8 mmol/L (ref 3.5–5.2)
Sodium: 136 mmol/L (ref 134–144)

## 2014-11-28 LAB — HEPATIC FUNCTION PANEL
ALT: 44 IU/L — ABNORMAL HIGH (ref 0–32)
AST: 37 IU/L (ref 0–40)
Albumin: 4.5 g/dL (ref 3.5–4.8)
Alkaline Phosphatase: 77 IU/L (ref 39–117)
Bilirubin Total: 0.4 mg/dL (ref 0.0–1.2)
Bilirubin, Direct: 0.14 mg/dL (ref 0.00–0.40)
Total Protein: 7.3 g/dL (ref 6.0–8.5)

## 2014-11-28 LAB — LIPID PANEL
Chol/HDL Ratio: 5.3 ratio units — ABNORMAL HIGH (ref 0.0–4.4)
Cholesterol, Total: 247 mg/dL — ABNORMAL HIGH (ref 100–199)
HDL: 47 mg/dL (ref >39)
LDL Calculated: 174 mg/dL — ABNORMAL HIGH (ref 0–99)
Triglycerides: 132 mg/dL (ref 0–149)
VLDL Cholesterol Cal: 26 mg/dL (ref 5–40)

## 2014-11-28 LAB — THYROID PANEL WITH TSH
Free Thyroxine Index: 1.9 (ref 1.2–4.9)
T3 Uptake Ratio: 22 % — ABNORMAL LOW (ref 24–39)
T4, Total: 8.8 ug/dL (ref 4.5–12.0)
TSH: 2.26 u[IU]/mL (ref 0.450–4.500)

## 2014-11-28 LAB — VITAMIN D 25 HYDROXY (VIT D DEFICIENCY, FRACTURES): Vit D, 25-Hydroxy: 21.1 ng/mL — ABNORMAL LOW (ref 30.0–100.0)

## 2014-11-30 ENCOUNTER — Telehealth: Payer: Self-pay | Admitting: *Deleted

## 2014-11-30 NOTE — Telephone Encounter (Signed)
Patient aware of results.  She has been taking her crestor and will take double the amount of the OTC vitamin d.  She does not want the prescription strength.

## 2014-11-30 NOTE — Telephone Encounter (Signed)
-----   Message from Chipper Herb, MD sent at 11/28/2014  7:11 AM EDT ----- The blood sugar is elevated at 147. The creatinine, the most important kidney function test remains within normal limits. The electrolytes are good except the chloride is slightly decreased and this is consistent with past readings. 1 liver function test remains slightly elevated but not as high as it has been in the past and we will continue to follow this. Cholesterol numbers with traditional lipid testing have an LDL C that is very elevated at 174 and this is consistent with readings done 1 year ago. The triglycerides are within normal limits. The good cholesterol or the HDL is good at 47. Please make sure that the patient is taking her Crestor regularly and getting as much exercise as possible and following her diet as closely as possible. The vitamin D level remains low at 21.1.------ please confirm with the patient whether or not she is taking any vitamin D at the present time. If she is not, she should start vitamin D 50,000 units #12 1 weekly with 1 refill and the vitamin D level should be rechecked again in 3-4 months.

## 2014-12-15 ENCOUNTER — Telehealth: Payer: Self-pay | Admitting: Family Medicine

## 2014-12-19 DIAGNOSIS — H2511 Age-related nuclear cataract, right eye: Secondary | ICD-10-CM | POA: Diagnosis not present

## 2014-12-19 DIAGNOSIS — H25011 Cortical age-related cataract, right eye: Secondary | ICD-10-CM | POA: Diagnosis not present

## 2014-12-27 ENCOUNTER — Telehealth: Payer: Self-pay | Admitting: Family Medicine

## 2014-12-27 MED ORDER — AZELASTINE HCL 0.1 % NA SOLN: 1.0000 | Freq: Two times a day (BID) | NASAL | Status: DC

## 2014-12-27 NOTE — Telephone Encounter (Signed)
Do not see astelin on med list?

## 2014-12-27 NOTE — Telephone Encounter (Signed)
It is okay to refill the Astelin nasal spray for 1 year

## 2015-01-08 ENCOUNTER — Other Ambulatory Visit: Payer: Self-pay | Admitting: *Deleted

## 2015-01-08 MED ORDER — GLUCOSE BLOOD VI STRP: ORAL_STRIP | Status: DC

## 2015-01-16 ENCOUNTER — Encounter: Payer: Self-pay | Admitting: Family Medicine

## 2015-01-16 ENCOUNTER — Ambulatory Visit (INDEPENDENT_AMBULATORY_CARE_PROVIDER_SITE_OTHER): Payer: Medicare Other | Admitting: Family Medicine

## 2015-01-16 VITALS — BP 128/70 | HR 79 | Temp 97.7°F | Ht 62.0 in | Wt 192.0 lb

## 2015-01-16 DIAGNOSIS — J9801 Acute bronchospasm: Secondary | ICD-10-CM

## 2015-01-16 DIAGNOSIS — R0981 Nasal congestion: Secondary | ICD-10-CM

## 2015-01-16 DIAGNOSIS — J301 Allergic rhinitis due to pollen: Secondary | ICD-10-CM

## 2015-01-16 MED ORDER — PREDNISONE 10 MG PO TABS: ORAL_TABLET | ORAL | Status: DC

## 2015-01-16 MED ORDER — METHYLPREDNISOLONE ACETATE 80 MG/ML IJ SUSP
60.0000 mg | Freq: Once | INTRAMUSCULAR | Status: AC
  Administered 2015-01-16: 60 mg via INTRAMUSCULAR

## 2015-01-16 NOTE — Patient Instructions (Signed)
Drink plenty of fluids Take Mucinex either regular strength her maximum strength plain, blue and white in color, 1 twice daily with a large glass of water as this helps to loosen chest congestion so that he can come up better Take prednisone as directed Continue either with Chlor-Trimeton or Allegra or Astelin but not all of the Use Flonase regularly 1-2 sprays each nostril at bedtime Use nasal saline throughout the day--ayr

## 2015-01-16 NOTE — Progress Notes (Signed)
Subjective:    Patient ID: Elizabeth [name], female    DOB: 04-07-1941, 74 y.o.   MRN: [id number]  HPI Patient here today for allergies and congestion. The patient takes Allegra, uses Astelin, and uses when necessary Flonase.      Patient Active Problem List   Diagnosis Date Noted  . Type 2 diabetes mellitus with peripheral neuropathy 11/27/2014  . CVA (cerebral vascular accident) 02/27/2012  . Cerebral aneurysm 02/27/2012  . 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 Gait instability 02/27/2012  . Vision disturbance S/P CVA (cerebrovascular accident) 02/27/2012  . Dyspnea 11/14/2011  . Thyroid disease 10/12/2008  . Diabetes 08/29/2008  . Hyperlipidemia 08/29/2008  . OBESITY, UNSPECIFIED 08/29/2008  . COPD 08/29/2008  . WEIGHT LOSS 08/29/2008  . DIARRHEA 08/29/2008  . HTN (hypertension) 08/28/2008  . ASTHMA 08/28/2008  . ESOPHAGEAL STRICTURE 08/28/2008  . GERD 08/28/2008  . HIATAL HERNIA 08/28/2008  . DIVERTICULOSIS, COLON 08/28/2008  . COLONIC POLYPS, HX OF 08/28/2008   Outpatient Encounter Prescriptions as of 01/16/2015  Medication Sig  . ALPRAZolam (XANAX) 0.25 MG tablet Take 1 tablet (0.25 mg total) by mouth 2 (two) times daily as needed.  Marland Kitchen amLODipine (NORVASC) 10 MG tablet Take 1 tablet (10 mg total) by mouth daily.  Marland Kitchen azelastine (ASTELIN) 0.1 % nasal spray Place 1 spray into both nostrils 2 (two) times daily. Use in each nostril as directed  . clopidogrel (PLAVIX) 75 MG tablet Take 1 tablet (75 mg total) by mouth daily.  Marland Kitchen escitalopram (LEXAPRO) 20 MG tablet TAKE ONE TABLET BY MOUTH ONCE DAILY AS DIRECTED  . fexofenadine (ALLEGRA) 180 MG tablet Take 180 mg by mouth daily.  . fluticasone (FLONASE) 50 MCG/ACT nasal spray USE ONE SPRAY(S) INTO THE NOSE DAILY  . gabapentin (NEURONTIN) 300 MG capsule Take 1 capsule (300 mg total) by mouth at bedtime. As directed  . glucose blood test strip Check BS BID and PRN. DX E11.9  . hydrochlorothiazide (MICROZIDE) 12.5 MG capsule TAKE  ONE CAPSULE BY MOUTH ONCE DAILY  . metFORMIN (GLUCOPHAGE) 1000 MG tablet TAKE ONE TABLET BY MOUTH IN THE MORNING WITH BREAKFAST  . metoCLOPramide (REGLAN) 10 MG tablet TAKE ONE TABLET BY MOUTH 2 TIMES DAILY  . oxybutynin (DITROPAN XL) 15 MG 24 hr tablet Take 1 tablet (15 mg total) by mouth every morning.  . pantoprazole (PROTONIX) 40 MG tablet Take 1 tablet (40 mg total) by mouth daily.  . rosuvastatin (CRESTOR) 20 MG tablet Take 1 tablet (20 mg total) by mouth daily.  . sitaGLIPtin (JANUVIA) 50 MG tablet Take 1 tablet (50 mg total) by mouth daily.   No facility-administered encounter medications on [name] as of 01/16/2015.      Review of Systems  Constitutional: Negative.   HENT: Positive for congestion.        Seasonal allergies  Eyes: Negative.   Respiratory: Negative.   Cardiovascular: Negative.   Gastrointestinal: Negative.   Endocrine: Negative.   Genitourinary: Negative.   Musculoskeletal: Negative.   Skin: Negative.   Allergic/Immunologic: Negative.   Neurological: Negative.   Hematological: Negative.   Psychiatric/Behavioral: Negative.        Objective:   Physical Exam  Constitutional: She is oriented to person, place, and time. She appears well-developed and well-nourished. No distress.  HENT:  Head: Normocephalic and atraumatic.  Mouth/Throat: Oropharynx is clear and moist. No oropharyngeal exudate.  His bilateral ears cerumen and nasal pallor with clear rhinorrhea  Eyes: Conjunctivae and EOM are normal. Right eye exhibits  no discharge. Left eye exhibits no discharge. No scleral icterus.  Neck: Normal range of motion. Neck supple. No thyromegaly present.  Cardiovascular: Normal rate, regular rhythm and normal heart sounds.   No murmur heard. Pulmonary/Chest: Effort normal. She has wheezes. She has no rales.  A few expiratory wheezes and a few rhonchi with coughing  Musculoskeletal: Normal range of motion. She exhibits no edema.  Lymphadenopathy:    She has no  cervical adenopathy.  Neurological: She is alert and oriented to person, place, and time.  Skin: Skin is dry. No rash noted.  Psychiatric: She has a normal mood and affect. Her behavior is normal. Judgment and thought content normal.  Nursing note and vitals reviewed.  BP 128/70 mmHg  Pulse 79  Temp(Src) 97.7 F (36.5 C) (Oral)  Ht 5\' 2"  (1.575 m)  Wt 192 lb (87.091 kg)  BMI 35.11 kg/m2        Assessment & Plan:  1. Nasal congestion -Use nasal saline during the day and use Flonase at night 1-2 sprays each nostril - POCT CBC  2. Allergic rhinitis due to pollen -Also use nasal saline - predniSONE (DELTASONE) 10 MG tablet; 1 tablet 4 times a day for 2 days,  1 tablet 3 times a day for 2 days,  1 tablet 2 times a day for 2 days, 1 tablet daily for 2 days  Dispense: 20 tablet; Refill: 0 - methylPREDNISolone acetate (DEPO-MEDROL) injection 60 mg; Inject 0.75 mLs (60 mg total) into the muscle once.  3. Bronchospasm -Drink plenty of fluids and take Mucinex regularly twice daily with a large glass of water - predniSONE (DELTASONE) 10 MG tablet; 1 tablet 4 times a day for 2 days,  1 tablet 3 times a day for 2 days,  1 tablet 2 times a day for 2 days, 1 tablet daily for 2 days  Dispense: 20 tablet; Refill: 0 - methylPREDNISolone acetate (DEPO-MEDROL) injection 60 mg; Inject 0.75 mLs (60 mg total) into the muscle once.  Patient Instructions  Drink plenty of fluids Take Mucinex either regular strength her maximum strength plain, blue and white in color, 1 twice daily with a large glass of water as this helps to loosen chest congestion so that he can come up better Take prednisone as directed Continue either with Chlor-Trimeton or Allegra or Astelin but not all of the Use Flonase regularly 1-2 sprays each nostril at bedtime Use nasal saline throughout the day--ayr   Arrie Senate MD

## 2015-02-14 ENCOUNTER — Telehealth: Payer: Self-pay | Admitting: Family Medicine

## 2015-02-14 DIAGNOSIS — M25551 Pain in right hip: Secondary | ICD-10-CM

## 2015-02-14 NOTE — Telephone Encounter (Signed)
Please advise on MRI and route to [facility]

## 2015-02-14 NOTE — Telephone Encounter (Signed)
Please arrange an [facility] this [facility] pain

## 2015-02-15 ENCOUNTER — Ambulatory Visit (HOSPITAL_COMMUNITY)
Admission: RE | Admit: 2015-02-15 | Discharge: 2015-02-15 | Disposition: A | Discharge status: Home / Self Care | Payer: Medicare Other | Source: Ambulatory Visit | Attending: Family Medicine | Admitting: Family Medicine

## 2015-02-15 DIAGNOSIS — M25551 Pain in right hip: Secondary | ICD-10-CM | POA: Diagnosis not present

## 2015-02-16 ENCOUNTER — Encounter: Payer: Self-pay | Admitting: Physician Assistant

## 2015-02-16 ENCOUNTER — Ambulatory Visit (INDEPENDENT_AMBULATORY_CARE_PROVIDER_SITE_OTHER): Payer: Medicare Other | Admitting: Physician Assistant

## 2015-02-16 VITALS — BP 137/79 | HR 80 | Temp 97.5°F | Ht 64.0 in | Wt 189.0 lb

## 2015-02-16 DIAGNOSIS — M25551 Pain in right hip: Secondary | ICD-10-CM

## 2015-02-16 NOTE — Progress Notes (Signed)
Subjective:     Patient ID: Elizabeth [name], female   DOB: 18-Apr-1941, 74 y.o.   MRN: [id number]  HPI Pt with long hx of R hip pain Sx started years ago after a fall She is having increasing sx over the last 6 months At this time she can not stand laying on the R side She has had prev injections in to the area Recent MRI of the hip showed sl degen changes and signs of bursitis  Review of Systems  Constitutional: Negative.   Musculoskeletal: Positive for arthralgias and gait problem. Negative for joint swelling.       Objective:   Physical Exam  Constitutional: She appears well-developed and well-nourished.  Nursing note and vitals reviewed. No ecchy/edema to the R hip FROM the hip Sl sx with int/ext rotation + TTP over the lateral hip Good strength distal Offered injection Consent obtained Lateral hip prep with Betadine 1.5cc marc/1.5 cc Kenalog under sterile technique to point of maximal TTP Dressing placed     Assessment:     R hip pain- probable bursitis    Plan:     Nl course reviewed Continue all other meds F/U prn

## 2015-02-16 NOTE — Patient Instructions (Signed)
Bursitis °Bursitis is a swelling and soreness (inflammation) of a fluid-filled sac (bursa) that overlies and protects a joint. It can be caused by injury, overuse of the joint, arthritis or infection. The joints most likely to be affected are the elbows, shoulders, hips and knees. °HOME CARE INSTRUCTIONS  °· Apply ice to the affected area for 15-20 minutes each hour while awake for 2 days. Put the ice in a plastic bag and place a towel between the bag of ice and your skin. °· Rest the injured joint as much as possible, but continue to put the joint through a full range of motion, 4 times per day. (The shoulder joint especially becomes rapidly "frozen" if not used.) When the pain lessens, begin normal slow movements and usual activities. °· Only take over-the-counter or prescription medicines for pain, discomfort or fever as directed by your caregiver. °· Your caregiver may recommend draining the bursa and injecting medicine into the bursa. This may help the healing process. °· Follow all instructions for follow-up with your caregiver. This includes any orthopedic referrals, physical therapy and rehabilitation. Any delay in obtaining necessary care could result in a delay or failure of the bursitis to heal and chronic pain. °SEEK IMMEDIATE MEDICAL CARE IF:  °· Your pain increases even during treatment. °· You develop an oral temperature above 102° F (38.9° C) and have heat and inflammation over the involved bursa. °MAKE SURE YOU:  °· Understand these instructions. °· Will watch your condition. °· Will get help right away if you are not doing well or get worse. °Document Released: 08/22/2000 Document Revised: 11/17/2011 Document Reviewed: 11/14/2013 °[employer]® Patient Information ©2015 [employer]. This information is not intended to replace advice given to you by your health care provider. Make sure you discuss any questions you have with your health care provider. ° °

## 2015-02-22 ENCOUNTER — Telehealth: Payer: Self-pay | Admitting: Family Medicine

## 2015-02-22 MED ORDER — OXYBUTYNIN CHLORIDE ER 15 MG PO TB24: 15.0000 mg | ORAL_TABLET | Freq: Every morning | ORAL | Status: DC

## 2015-02-22 NOTE — Telephone Encounter (Signed)
Pt aware.

## 2015-02-27 ENCOUNTER — Telehealth: Payer: Self-pay | Admitting: Family Medicine

## 2015-02-27 NOTE — Telephone Encounter (Signed)
Please review and advise.

## 2015-02-27 NOTE — Telephone Encounter (Signed)
Flexeril 10 mg twice daily #30 as needed

## 2015-02-28 ENCOUNTER — Other Ambulatory Visit: Payer: Self-pay | Admitting: Family Medicine

## 2015-02-28 MED ORDER — CYCLOBENZAPRINE HCL 10 MG PO TABS: 10.0000 mg | ORAL_TABLET | Freq: Two times a day (BID) | ORAL | Status: DC | PRN

## 2015-02-28 NOTE — Telephone Encounter (Signed)
Patient aware and rx had been sent to [facility].

## 2015-03-08 ENCOUNTER — Telehealth: Payer: Self-pay | Admitting: Family Medicine

## 2015-03-08 NOTE — Telephone Encounter (Signed)
Please have patient come in to see Bill and let him get an LS spine and possibly an MRI of the LS spine to further evaluate the pain that she is having especially since it seems to be positional related. Get her in as soon as possible

## 2015-03-08 NOTE — Telephone Encounter (Signed)
Appt made w/ [facility] for 7/1

## 2015-03-09 ENCOUNTER — Ambulatory Visit (INDEPENDENT_AMBULATORY_CARE_PROVIDER_SITE_OTHER): Payer: Medicare Other | Admitting: Physician Assistant

## 2015-03-09 ENCOUNTER — Encounter: Payer: Self-pay | Admitting: Physician Assistant

## 2015-03-09 VITALS — BP 153/77 | HR 89 | Temp 99.4°F | Ht 64.0 in | Wt 189.6 lb

## 2015-03-09 DIAGNOSIS — M545 Low back pain, unspecified: Secondary | ICD-10-CM

## 2015-03-09 DIAGNOSIS — M79604 Pain in right leg: Secondary | ICD-10-CM

## 2015-03-09 NOTE — Patient Instructions (Signed)
Back Pain, Adult Low back pain is very common. About 1 in 5 people have back pain.The cause of low back pain is rarely dangerous. The pain often gets better over time.About half of people with a sudden onset of back pain feel better in just 2 weeks. About 8 in 10 people feel better by 6 weeks.  CAUSES Some common causes of back pain include:  Strain of the muscles or ligaments supporting the spine.  Wear and tear (degeneration) of the spinal discs.  Arthritis.  Direct injury to the back. DIAGNOSIS Most of the time, the direct cause of low back pain is not known.However, back pain can be treated effectively even when the exact cause of the pain is unknown.Answering your caregiver's questions about your overall health and symptoms is one of the most accurate ways to make sure the cause of your pain is not dangerous. If your caregiver needs more information, he or she may order lab work or imaging tests (X-rays or MRIs).However, even if imaging tests show changes in your back, this usually does not require surgery. HOME CARE INSTRUCTIONS For many people, back pain returns.Since low back pain is rarely dangerous, it is often a condition that people can learn to manageon their own.   Remain active. It is stressful on the back to sit or stand in one place. Do not sit, drive, or stand in one place for more than 30 minutes at a time. Take short walks on level surfaces as soon as pain allows.Try to increase the length of time you walk each day.  Do not stay in bed.Resting more than 1 or 2 days can delay your recovery.  Do not avoid exercise or work.Your body is made to move.It is not dangerous to be active, even though your back may hurt.Your back will likely heal faster if you return to being active before your pain is gone.  Pay attention to your body when you bend and lift. Many people have less discomfortwhen lifting if they bend their knees, keep the load close to their bodies,and  avoid twisting. Often, the most comfortable positions are those that put less stress on your recovering back.  Find a comfortable position to sleep. Use a firm mattress and lie on your side with your knees slightly bent. If you lie on your back, put a pillow under your knees.  Only take over-the-counter or prescription medicines as directed by your caregiver. Over-the-counter medicines to reduce pain and inflammation are often the most helpful.Your caregiver may prescribe muscle relaxant drugs.These medicines help dull your pain so you can more quickly return to your normal activities and healthy exercise.  Put ice on the injured area.  Put ice in a plastic bag.  Place a towel between your skin and the bag.  Leave the ice on for 15-20 minutes, 03-04 times a day for the first 2 to 3 days. After that, ice and heat may be alternated to reduce pain and spasms.  Ask your caregiver about trying back exercises and gentle massage. This may be of some benefit.  Avoid feeling anxious or stressed.Stress increases muscle tension and can worsen back pain.It is important to recognize when you are anxious or stressed and learn ways to manage it.Exercise is a great option. SEEK MEDICAL CARE IF:  You have pain that is not relieved with rest or medicine.  You have pain that does not improve in 1 week.  You have new symptoms.  You are generally not feeling well. SEEK   IMMEDIATE MEDICAL CARE IF:   You have pain that radiates from your back into your legs.  You develop new bowel or bladder control problems.  You have unusual weakness or numbness in your arms or legs.  You develop nausea or vomiting.  You develop abdominal pain.  You feel faint. Document Released: 08/25/2005 Document Revised: 02/24/2012 Document Reviewed: 12/27/2013 ExitCare Patient Information 2015 [employer]. This information is not intended to replace advice given to you by your health care provider. Make sure you  discuss any questions you have with your health care provider.  

## 2015-03-09 NOTE — Progress Notes (Signed)
Subjective:     Patient ID: Elizabeth [name], female   DOB: 06-27-1941, 74 y.o.   MRN: [id number]  HPI Pt here with LBP and now R radicular leg pain/numbness Seen prev for same Prev sx were mainly to the hip Xray and MRI showed degen changes She has tried PT, NSAIDS, and injection but sx have continued and worsened She is now sleeping in recliner to help + Giving way of the leg  Review of Systems     Objective:   Physical Exam NAD Gait nl + TTP of the Lspine No palp spasm FROM of the Lspine SLR neg Strength distal good Good pulses/sensory     Assessment:     1. LBP radiating to right leg        Plan:     Will set up for MRI of Lspine If sx continue discussed referral to Ortho F/U pending testing

## 2015-03-14 ENCOUNTER — Other Ambulatory Visit: Payer: Self-pay | Admitting: *Deleted

## 2015-03-14 ENCOUNTER — Other Ambulatory Visit (HOSPITAL_COMMUNITY): Payer: Self-pay | Admitting: Interventional Radiology

## 2015-03-14 DIAGNOSIS — R29898 Other symptoms and signs involving the musculoskeletal system: Secondary | ICD-10-CM

## 2015-03-14 DIAGNOSIS — M545 Low back pain: Secondary | ICD-10-CM

## 2015-03-14 DIAGNOSIS — M5441 Lumbago with sciatica, right side: Secondary | ICD-10-CM

## 2015-03-15 ENCOUNTER — Encounter: Payer: Self-pay | Admitting: *Deleted

## 2015-03-15 ENCOUNTER — Ambulatory Visit (HOSPITAL_COMMUNITY)
Admission: RE | Admit: 2015-03-15 | Discharge: 2015-03-15 | Disposition: A | Discharge status: Home / Self Care | Payer: Medicare Other | Source: Ambulatory Visit | Attending: Physician Assistant | Admitting: Physician Assistant

## 2015-03-15 DIAGNOSIS — M5126 Other intervertebral disc displacement, lumbar region: Secondary | ICD-10-CM | POA: Diagnosis not present

## 2015-03-15 DIAGNOSIS — M5441 Lumbago with sciatica, right side: Secondary | ICD-10-CM | POA: Diagnosis not present

## 2015-03-15 DIAGNOSIS — R29898 Other symptoms and signs involving the musculoskeletal system: Secondary | ICD-10-CM

## 2015-03-16 ENCOUNTER — Other Ambulatory Visit: Payer: Self-pay | Admitting: Physician Assistant

## 2015-03-16 MED ORDER — METAXALONE 800 MG PO TABS: 800.0000 mg | ORAL_TABLET | Freq: Three times a day (TID) | ORAL | Status: DC

## 2015-03-26 ENCOUNTER — Other Ambulatory Visit (HOSPITAL_COMMUNITY): Payer: Medicare Other

## 2015-03-26 ENCOUNTER — Ambulatory Visit (HOSPITAL_COMMUNITY)
Admission: RE | Admit: 2015-03-26 | Discharge: 2015-03-26 | Disposition: A | Discharge status: Home / Self Care | Payer: Medicare Other | Source: Ambulatory Visit | Attending: Interventional Radiology | Admitting: Interventional Radiology

## 2015-03-26 DIAGNOSIS — R29898 Other symptoms and signs involving the musculoskeletal system: Secondary | ICD-10-CM

## 2015-03-26 DIAGNOSIS — M545 Low back pain: Secondary | ICD-10-CM

## 2015-03-26 DIAGNOSIS — M5136 Other intervertebral disc degeneration, lumbar region: Secondary | ICD-10-CM | POA: Diagnosis not present

## 2015-03-27 ENCOUNTER — Telehealth: Payer: Self-pay | Admitting: *Deleted

## 2015-03-27 NOTE — Telephone Encounter (Signed)
She needs to get meds form the specialist

## 2015-03-28 ENCOUNTER — Telehealth: Payer: Self-pay

## 2015-03-28 NOTE — Telephone Encounter (Signed)
INsurance denied prior authorization for Metaxalone Must try Celecoxib, Diflunisil, Etodolac, Ketoprofen, Meloxicam, Nabymetone or Tizanidine

## 2015-03-28 NOTE — Telephone Encounter (Signed)
Pt aware of providers recommendation.

## 2015-03-28 NOTE — Telephone Encounter (Signed)
Please find out who ordered this and see what options are available and may discuss with the clinical pharmacist

## 2015-03-29 ENCOUNTER — Other Ambulatory Visit (HOSPITAL_COMMUNITY): Payer: Self-pay | Admitting: Interventional Radiology

## 2015-03-29 DIAGNOSIS — I671 Cerebral aneurysm, nonruptured: Secondary | ICD-10-CM

## 2015-04-06 DIAGNOSIS — M5126 Other intervertebral disc displacement, lumbar region: Secondary | ICD-10-CM | POA: Diagnosis not present

## 2015-04-06 DIAGNOSIS — M4726 Other spondylosis with radiculopathy, lumbar region: Secondary | ICD-10-CM | POA: Diagnosis not present

## 2015-04-06 DIAGNOSIS — M5136 Other intervertebral disc degeneration, lumbar region: Secondary | ICD-10-CM | POA: Diagnosis not present

## 2015-04-06 DIAGNOSIS — M546 Pain in thoracic spine: Secondary | ICD-10-CM | POA: Diagnosis not present

## 2015-04-06 DIAGNOSIS — M549 Dorsalgia, unspecified: Secondary | ICD-10-CM | POA: Diagnosis not present

## 2015-04-18 ENCOUNTER — Ambulatory Visit (HOSPITAL_COMMUNITY): Admission: RE | Admit: 2015-04-18 | Payer: Medicare Other | Source: Ambulatory Visit

## 2015-04-18 ENCOUNTER — Ambulatory Visit (HOSPITAL_COMMUNITY): Payer: Medicare Other

## 2015-04-20 ENCOUNTER — Other Ambulatory Visit: Payer: Self-pay

## 2015-04-20 NOTE — Telephone Encounter (Signed)
Last seen 03/09/15  [facility]  If approved route to nurse to call into [facility]   [phone]

## 2015-04-23 ENCOUNTER — Ambulatory Visit: Payer: Medicare Other | Admitting: Family Medicine

## 2015-04-23 MED ORDER — ALPRAZOLAM 0.25 MG PO TABS: 0.2500 mg | ORAL_TABLET | Freq: Two times a day (BID) | ORAL | Status: DC | PRN

## 2015-04-23 NOTE — Telephone Encounter (Signed)
Left authorization on voicemail 

## 2015-04-24 ENCOUNTER — Ambulatory Visit (INDEPENDENT_AMBULATORY_CARE_PROVIDER_SITE_OTHER): Payer: Medicare Other | Admitting: Family Medicine

## 2015-04-24 ENCOUNTER — Encounter: Payer: Self-pay | Admitting: Family Medicine

## 2015-04-24 VITALS — BP 125/73 | HR 95 | Temp 97.5°F | Ht 64.0 in | Wt 187.0 lb

## 2015-04-24 DIAGNOSIS — E118 Type 2 diabetes mellitus with unspecified complications: Secondary | ICD-10-CM | POA: Diagnosis not present

## 2015-04-24 DIAGNOSIS — E785 Hyperlipidemia, unspecified: Secondary | ICD-10-CM

## 2015-04-24 DIAGNOSIS — I2510 Atherosclerotic heart disease of native coronary artery without angina pectoris: Secondary | ICD-10-CM | POA: Diagnosis not present

## 2015-04-24 DIAGNOSIS — E559 Vitamin D deficiency, unspecified: Secondary | ICD-10-CM | POA: Diagnosis not present

## 2015-04-24 DIAGNOSIS — K219 Gastro-esophageal reflux disease without esophagitis: Secondary | ICD-10-CM | POA: Diagnosis not present

## 2015-04-24 DIAGNOSIS — E079 Disorder of thyroid, unspecified: Secondary | ICD-10-CM | POA: Diagnosis not present

## 2015-04-24 DIAGNOSIS — I10 Essential (primary) hypertension: Secondary | ICD-10-CM | POA: Diagnosis not present

## 2015-04-24 MED ORDER — METAXALONE 800 MG PO TABS: 800.0000 mg | ORAL_TABLET | Freq: Every evening | ORAL | Status: DC | PRN

## 2015-04-24 NOTE — Patient Instructions (Addendum)
Medicare Annual Wellness Visit  [facility] and the medical providers at [facility] strive to bring you the best medical care.  In doing so we not only want to address your current medical conditions and concerns but also to detect new conditions early and prevent illness, disease and health-related problems.    Medicare offers a yearly Wellness Visit which allows our clinical staff to assess your need for preventative services including immunizations, lifestyle education, counseling to decrease risk of preventable diseases and screening for fall risk and other medical concerns.    This visit is provided free of charge (no copay) for all Medicare recipients. The clinical pharmacists at [facility] have begun to conduct these Wellness Visits which will also include a thorough review of all your medications.    As you primary medical provider recommend that you make an appointment for your Annual Wellness Visit if you have not done so already this year.  You may set up this appointment before you leave today or you may call back ([phone]) and schedule an appointment.  Please make sure when you call that you mention that you are scheduling your Annual Wellness Visit with the clinical pharmacist so that the appointment may be made for the proper length of time.     Continue current medications. Continue good therapeutic lifestyle changes which include good diet and exercise. Fall precautions discussed with patient. If an FOBT was given today- please return it to our front desk. If you are over 85 years old - you may need Prevnar 29 or the adult Pneumonia vaccine.   After your visit with [place] today you will receive a survey in the mail or online from [employer] regarding your care with [place]. Please take a moment to fill this out. Your feedback is very important to [place] as you can help [place] better understand your patient needs as well as  improve your experience and satisfaction. WE CARE ABOUT YOU!!!   Cont current medications Switch flexeril to skelaxin Keep follow up appointments with  Other md's  continue to monitor blood sugars at home and sick with diet as closely as possible  Recent vitamin D levels were low and patient should be reminded to take vitamin D regularly  She should exercise regularly and follow her diet as closely as possible to keep the weight down

## 2015-04-24 NOTE — Progress Notes (Signed)
Subjective:    Patient ID: Elizabeth [name], female    DOB: 05/15/1941, 74 y.o.   MRN: [id number]  HPI Pt here for follow up and management of chronic medical problems which includes hypertension, diabetes, and hyperlipidemia. She is taking medications regularly. She complains of back pain and general arthralgias. She also complains of pain in her legs. She will be given an FOBT to return and will return to the office fasting for lab work. Her vital signs are stable today. The patient indicates that her home blood sugars have been running around 100. She is doing all she can to lose weight and has lost a few pounds since last visit. She also recently lost her sister to pancreatic cancer. She is only one left and her family now. She is scheduled to go back and see the interventional radiologist for an MRA of the brain. She is not taking her cholesterol medicine regularly. She denies chest pain or shortness of breath. She is having no trouble with her stomach at the present time and is only taking her pantoprazole  every other day. She has no complaints with her voiding.       Patient Active Problem List   Diagnosis Date Noted  . Type 2 diabetes mellitus with peripheral neuropathy 11/27/2014  . CVA (cerebral vascular accident) 02/27/2012  . Cerebral aneurysm 02/27/2012  . 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 Gait instability 02/27/2012  . Vision disturbance S/P CVA (cerebrovascular accident) 02/27/2012  . Dyspnea 11/14/2011  . Thyroid disease 10/12/2008  . Diabetes 08/29/2008  . Hyperlipidemia 08/29/2008  . OBESITY, UNSPECIFIED 08/29/2008  . COPD 08/29/2008  . WEIGHT LOSS 08/29/2008  . DIARRHEA 08/29/2008  . HTN (hypertension) 08/28/2008  . ASTHMA 08/28/2008  . ESOPHAGEAL STRICTURE 08/28/2008  . GERD 08/28/2008  . HIATAL HERNIA 08/28/2008  . DIVERTICULOSIS, COLON 08/28/2008  . COLONIC POLYPS, HX OF 08/28/2008   Outpatient Encounter Prescriptions as of 04/24/2015  Medication Sig  .  ALPRAZolam (XANAX) 0.25 MG tablet Take 1 tablet (0.25 mg total) by mouth 2 (two) times daily as needed.  Marland Kitchen amLODipine (NORVASC) 10 MG tablet Take 1 tablet (10 mg total) by mouth daily.  Marland Kitchen azelastine (ASTELIN) 0.1 % nasal spray Place 1 spray into both nostrils 2 (two) times daily. Use in each nostril as directed  . clopidogrel (PLAVIX) 75 MG tablet TAKE 1 TABLET BY MOUTH DAILY.  . cyclobenzaprine (FLEXERIL) 10 MG tablet Take 1 tablet (10 mg total) by mouth 2 (two) times daily as needed for muscle spasms.  Marland Kitchen escitalopram (LEXAPRO) 20 MG tablet TAKE ONE TABLET BY MOUTH ONCE DAILY AS DIRECTED  . fexofenadine (ALLEGRA) 180 MG tablet Take 180 mg by mouth daily.  . fluticasone (FLONASE) 50 MCG/ACT nasal spray USE ONE SPRAY(S) INTO THE NOSE DAILY  . glucose blood test strip Check BS BID and PRN. DX E11.9  . hydrochlorothiazide (MICROZIDE) 12.5 MG capsule TAKE ONE CAPSULE BY MOUTH ONCE DAILY  . metaxalone (SKELAXIN) 800 MG tablet Take 1 tablet (800 mg total) by mouth 3 (three) times daily.  . metFORMIN (GLUCOPHAGE) 1000 MG tablet TAKE ONE TABLET BY MOUTH IN THE MORNING WITH BREAKFAST  . metoCLOPramide (REGLAN) 10 MG tablet TAKE ONE TABLET BY MOUTH 2 TIMES DAILY  . oxybutynin (DITROPAN XL) 15 MG 24 hr tablet Take 1 tablet (15 mg total) by mouth every morning.  . pantoprazole (PROTONIX) 40 MG tablet TAKE 1 TABLET BY MOUTH DAILY.  . rosuvastatin (CRESTOR) 20 MG tablet Take 1  tablet (20 mg total) by mouth daily.  . sitaGLIPtin (JANUVIA) 50 MG tablet Take 1 tablet (50 mg total) by mouth daily.  . [DISCONTINUED] gabapentin (NEURONTIN) 300 MG capsule Take 1 capsule (300 mg total) by mouth at bedtime. As directed   No facility-administered encounter medications on [name] as of 04/24/2015.     Review of Systems  Constitutional: Negative.   HENT: Negative.   Eyes: Negative.   Respiratory: Negative.   Cardiovascular: Negative.   Gastrointestinal: Negative.   Endocrine: Negative.   Genitourinary: Negative.     Musculoskeletal: Positive for back pain and arthralgias (legs hurt).  Skin: Negative.   Allergic/Immunologic: Negative.   Neurological: Negative.   Hematological: Negative.   Psychiatric/Behavioral: Negative.        Objective:   Physical Exam  Constitutional: She is oriented to person, place, and time. She appears well-developed and well-nourished. No distress.  HENT:  Head: Normocephalic and atraumatic.  Right Ear: External ear normal.  Left Ear: External ear normal.  Nose: Nose normal.  Mouth/Throat: Oropharynx is clear and moist.  Eyes: Conjunctivae and EOM are normal. Pupils are equal, round, and reactive to light. Right eye exhibits no discharge. Left eye exhibits no discharge. No scleral icterus.  Neck: Normal range of motion. Neck supple. No thyromegaly present.  Cardiovascular: Normal rate, regular rhythm, normal heart sounds and intact distal pulses.  Exam reveals no gallop and no friction rub.   No murmur heard. At 84/m  Pulmonary/Chest: Effort normal and breath sounds normal. No respiratory distress. She has no wheezes. She has no rales. She exhibits no tenderness.  Clear anteriorly and posteriorly  Abdominal: Soft. Bowel sounds are normal. She exhibits no mass. There is no tenderness. There is no rebound and no guarding.  The abdomen is obese without masses tenderness or organ enlargement  Musculoskeletal: Normal range of motion. She exhibits no edema or tenderness.  Lymphadenopathy:    She has no cervical adenopathy.  Neurological: She is alert and oriented to person, place, and time. She has normal reflexes. No cranial nerve deficit.  Skin: Skin is warm and dry. No rash noted.  Psychiatric: She has a normal mood and affect. Her behavior is normal. Judgment and thought content normal.  Nursing note and vitals reviewed.  BP 125/73 mmHg  Pulse 95  Temp(Src) 97.5 F (36.4 C) (Oral)  Ht $R'5\' 4"'eS$  (1.626 m)  Wt 187 lb (84.823 kg)  BMI 32.08 kg/m2        Assessment  & Plan:  1. Type 2 diabetes mellitus with complication -The patient indicates that her blood sugars at home and been running around 100. We will not change the current treatment pending results of lab work. - POCT CBC; Future - BMP8+EGFR; Future - POCT glycosylated hemoglobin (Hb A1C); Future  2. Hyperlipidemia -The patient has not taken her cholesterol medicine and a good while since she ran out of it. We will make a decision about the next step of what to do when the lab work is returned and it may be that putting her on a generic statin that she can afford if the cholesterol remains elevated. - POCT CBC; Future - NMR, lipoprofile; Future - Hepatic function panel; Future  3. Gastroesophageal reflux disease, esophagitis presence not specified -She is currently not having any problem with reflux and she is taking her proton pump inhibitor every other day. - POCT CBC; Future - Hepatic function panel; Future  4. Thyroid disease - POCT CBC; Future - Thyroid Panel With  TSH; Future  5. Essential hypertension -The blood pressure is good today and she will continue with current treatment - POCT CBC; Future - BMP8+EGFR; Future - Hepatic function panel; Future  6. Vitamin D deficiency -The last vitamin D level was low and we will not adjust this pending results of lab work and will be done. - POCT CBC; Future - Vit D  25 hydroxy (rtn osteoporosis monitoring); Future  7. ASCVD (arteriosclerotic cardiovascular disease) -She may need to have her Crestor reinstated or a different statin started if the lab work comes back elevated. She should continue with aggressive therapeutic  lifestyle changes. - POCT CBC; Future - NMR, lipoprofile; Future  Meds ordered this encounter  Medications  . metaxalone (SKELAXIN) 800 MG tablet    Sig: Take 1 tablet (800 mg total) by mouth at bedtime as needed for muscle spasms.    Dispense:  30 tablet    Refill:  3   Patient Instructions                        Medicare Annual Wellness Visit  [facility] and the medical providers at [facility] strive to bring you the best medical care.  In doing so we not only want to address your current medical conditions and concerns but also to detect new conditions early and prevent illness, disease and health-related problems.    Medicare offers a yearly Wellness Visit which allows our clinical staff to assess your need for preventative services including immunizations, lifestyle education, counseling to decrease risk of preventable diseases and screening for fall risk and other medical concerns.    This visit is provided free of charge (no copay) for all Medicare recipients. The clinical pharmacists at [facility] have begun to conduct these Wellness Visits which will also include a thorough review of all your medications.    As you primary medical provider recommend that you make an appointment for your Annual Wellness Visit if you have not done so already this year.  You may set up this appointment before you leave today or you may call back ([phone]) and schedule an appointment.  Please make sure when you call that you mention that you are scheduling your Annual Wellness Visit with the clinical pharmacist so that the appointment may be made for the proper length of time.     Continue current medications. Continue good therapeutic lifestyle changes which include good diet and exercise. Fall precautions discussed with patient. If an FOBT was given today- please return it to our front desk. If you are over 48 years old - you may need Prevnar 5 or the adult Pneumonia vaccine.   After your visit with [place] today you will receive a survey in the mail or online from [employer] regarding your care with [place]. Please take a moment to fill this out. Your feedback is very important to [place] as you can help [place] better understand your patient needs as well as improve your  experience and satisfaction. WE CARE ABOUT YOU!!!   Cont current medications Switch flexeril to skelaxin Keep follow up appointments with  Other md's  continue to monitor blood sugars at home and sick with diet as closely as possible  Recent vitamin D levels were low and patient should be reminded to take vitamin D regularly  She should exercise regularly and follow her diet as closely as possible to keep the weight down   Arrie Senate  MD

## 2015-04-25 ENCOUNTER — Encounter: Payer: Self-pay | Admitting: *Deleted

## 2015-04-25 NOTE — Progress Notes (Signed)
Patient ID: Elizabeth [name], female   DOB: 06/03/1941, 74 y.o.   MRN: [id number]    Cases of acute pancreatitis - inflammation of the pancrease.(including hemorrhagic and necrotizing with some fatalities) have been reported with use of Januvia but this is very rare. Monitor for signs/symptoms of pancreatitis; discontinue use immediately if pancreatitis is suspected and initiate appropriate management. Use with caution in patients with a history of pancreatitis  I did not find any reports or precautions about Januiva and a link to pancreatic cancer.

## 2015-04-25 NOTE — Progress Notes (Signed)
Elizabeth [name],  DWM wanted to check with you about [facility]. He sister recently passed away from pancreatic cancer.  He wonders if there is any reason that she may need to stop / change her Januvia?? He thought he remembered something about the two correlating.  Thank you

## 2015-04-25 NOTE — Progress Notes (Signed)
Please let patient know there is no leaks with Januvia and pancreatic cancer

## 2015-05-01 ENCOUNTER — Other Ambulatory Visit: Payer: Self-pay | Admitting: Family Medicine

## 2015-05-02 ENCOUNTER — Ambulatory Visit (HOSPITAL_COMMUNITY)
Admission: RE | Admit: 2015-05-02 | Discharge: 2015-05-02 | Disposition: A | Discharge status: Home / Self Care | Payer: Medicare Other | Source: Ambulatory Visit | Attending: Interventional Radiology | Admitting: Interventional Radiology

## 2015-05-02 ENCOUNTER — Ambulatory Visit (HOSPITAL_COMMUNITY): Payer: Medicare Other

## 2015-05-02 DIAGNOSIS — Z48812 Encounter for surgical aftercare following surgery on the circulatory system: Secondary | ICD-10-CM | POA: Insufficient documentation

## 2015-05-02 DIAGNOSIS — Z8673 Personal history of transient ischemic attack (TIA), and cerebral infarction without residual deficits: Secondary | ICD-10-CM | POA: Insufficient documentation

## 2015-05-02 DIAGNOSIS — I671 Cerebral aneurysm, nonruptured: Secondary | ICD-10-CM | POA: Insufficient documentation

## 2015-05-02 LAB — CREATININE, SERUM
Creatinine, Ser: 0.87 mg/dL (ref 0.44–1.00)
GFR calc Af Amer: >60 mL/min (ref >60)
GFR calc non Af Amer: >60 mL/min (ref >60)

## 2015-05-02 MED ORDER — GADOBENATE DIMEGLUMINE 529 MG/ML IV SOLN
15.0000 mL | Freq: Once | INTRAVENOUS | Status: AC | PRN
  Administered 2015-05-02: 15 mL via INTRAVENOUS

## 2015-05-15 ENCOUNTER — Telehealth: Payer: Self-pay | Admitting: Family Medicine

## 2015-05-15 DIAGNOSIS — E1142 Type 2 diabetes mellitus with diabetic polyneuropathy: Secondary | ICD-10-CM

## 2015-05-15 DIAGNOSIS — E785 Hyperlipidemia, unspecified: Secondary | ICD-10-CM

## 2015-05-15 DIAGNOSIS — I10 Essential (primary) hypertension: Secondary | ICD-10-CM

## 2015-05-15 DIAGNOSIS — E079 Disorder of thyroid, unspecified: Secondary | ICD-10-CM

## 2015-05-15 DIAGNOSIS — E559 Vitamin D deficiency, unspecified: Secondary | ICD-10-CM

## 2015-05-17 NOTE — Telephone Encounter (Signed)
Patient aware orders have been placed.

## 2015-05-22 ENCOUNTER — Other Ambulatory Visit (INDEPENDENT_AMBULATORY_CARE_PROVIDER_SITE_OTHER): Payer: Medicare Other

## 2015-05-22 DIAGNOSIS — E1140 Type 2 diabetes mellitus with diabetic neuropathy, unspecified: Secondary | ICD-10-CM | POA: Diagnosis not present

## 2015-05-22 DIAGNOSIS — E559 Vitamin D deficiency, unspecified: Secondary | ICD-10-CM

## 2015-05-22 DIAGNOSIS — I10 Essential (primary) hypertension: Secondary | ICD-10-CM

## 2015-05-22 DIAGNOSIS — E1142 Type 2 diabetes mellitus with diabetic polyneuropathy: Secondary | ICD-10-CM

## 2015-05-22 DIAGNOSIS — E785 Hyperlipidemia, unspecified: Secondary | ICD-10-CM

## 2015-05-22 DIAGNOSIS — E079 Disorder of thyroid, unspecified: Secondary | ICD-10-CM | POA: Diagnosis not present

## 2015-05-22 LAB — POCT GLYCOSYLATED HEMOGLOBIN (HGB A1C): Hemoglobin A1C: 6.5

## 2015-05-22 NOTE — Addendum Note (Signed)
Addended by: Wyline Mood on: 05/22/2015 08:42 AM   Modules accepted: Orders

## 2015-05-22 NOTE — Progress Notes (Signed)
Lab only 

## 2015-05-23 ENCOUNTER — Other Ambulatory Visit: Payer: Self-pay | Admitting: *Deleted

## 2015-05-23 LAB — HEPATIC FUNCTION PANEL
ALT: 40 IU/L — ABNORMAL HIGH (ref 0–32)
AST: 37 IU/L (ref 0–40)
Albumin: 4.4 g/dL (ref 3.5–4.8)
Alkaline Phosphatase: 84 IU/L (ref 39–117)
Bilirubin Total: 0.4 mg/dL (ref 0.0–1.2)
Bilirubin, Direct: 0.13 mg/dL (ref 0.00–0.40)
Total Protein: 7 g/dL (ref 6.0–8.5)

## 2015-05-23 LAB — NMR, LIPOPROFILE
Cholesterol: 308 mg/dL — ABNORMAL HIGH (ref 100–199)
HDL Cholesterol by NMR: 50 mg/dL (ref >39)
HDL Particle Number: 26.2 umol/L — ABNORMAL LOW (ref >=30.5)
LDL Particle Number: 2264 nmol/L — ABNORMAL HIGH (ref <1000)
LDL Size: 21.2 nm (ref >20.5)
LDL-C: 227 mg/dL — ABNORMAL HIGH (ref 0–99)
LP-IR Score: 40 (ref <=45)
Small LDL Particle Number: 366 nmol/L (ref <=527)
Triglycerides by NMR: 154 mg/dL — ABNORMAL HIGH (ref 0–149)

## 2015-05-23 LAB — CBC WITH DIFFERENTIAL/PLATELET
Basophils Absolute: 0.1 10*3/uL (ref 0.0–0.2)
Basos: 1 %
EOS (ABSOLUTE): 0.1 10*3/uL (ref 0.0–0.4)
Eos: 2 %
Hematocrit: 43.1 % (ref 34.0–46.6)
Hemoglobin: 13.8 g/dL (ref 11.1–15.9)
Immature Grans (Abs): 0 10*3/uL (ref 0.0–0.1)
Immature Granulocytes: 0 %
Lymphocytes Absolute: 1.9 10*3/uL (ref 0.7–3.1)
Lymphs: 27 %
MCH: 27.3 pg (ref 26.6–33.0)
MCHC: 32 g/dL (ref 31.5–35.7)
MCV: 85 fL (ref 79–97)
Monocytes Absolute: 0.4 10*3/uL (ref 0.1–0.9)
Monocytes: 6 %
Neutrophils Absolute: 4.5 10*3/uL (ref 1.4–7.0)
Neutrophils: 64 %
Platelets: 382 10*3/uL — ABNORMAL HIGH (ref 150–379)
RBC: 5.05 x10E6/uL (ref 3.77–5.28)
RDW: 14.7 % (ref 12.3–15.4)
WBC: 7 10*3/uL (ref 3.4–10.8)

## 2015-05-23 LAB — BMP8+EGFR
BUN/Creatinine Ratio: 9 — ABNORMAL LOW (ref 11–26)
BUN: 7 mg/dL — ABNORMAL LOW (ref 8–27)
CO2: 27 mmol/L (ref 18–29)
Calcium: 10 mg/dL (ref 8.7–10.3)
Chloride: 94 mmol/L — ABNORMAL LOW (ref 97–108)
Creatinine, Ser: 0.77 mg/dL (ref 0.57–1.00)
GFR calc Af Amer: 89 mL/min/{1.73_m2} (ref >59)
GFR calc non Af Amer: 77 mL/min/{1.73_m2} (ref >59)
Glucose: 137 mg/dL — ABNORMAL HIGH (ref 65–99)
Potassium: 3.8 mmol/L (ref 3.5–5.2)
Sodium: 138 mmol/L (ref 134–144)

## 2015-05-23 LAB — THYROID PANEL WITH TSH
Free Thyroxine Index: 1.4 (ref 1.2–4.9)
T3 Uptake Ratio: 19 % — ABNORMAL LOW (ref 24–39)
T4, Total: 7.6 ug/dL (ref 4.5–12.0)
TSH: 1.95 u[IU]/mL (ref 0.450–4.500)

## 2015-05-23 LAB — VITAMIN D 25 HYDROXY (VIT D DEFICIENCY, FRACTURES): Vit D, 25-Hydroxy: 14.8 ng/mL — ABNORMAL LOW (ref 30.0–100.0)

## 2015-05-23 MED ORDER — EZETIMIBE 10 MG PO TABS: 10.0000 mg | ORAL_TABLET | Freq: Every day | ORAL | Status: DC

## 2015-05-23 MED ORDER — VITAMIN D (ERGOCALCIFEROL) 1.25 MG (50000 UNIT) PO CAPS: 50000.0000 [IU] | ORAL_CAPSULE | ORAL | Status: DC

## 2015-06-21 ENCOUNTER — Telehealth (HOSPITAL_COMMUNITY): Payer: Self-pay | Admitting: Interventional Radiology

## 2015-06-21 NOTE — Telephone Encounter (Signed)
Called pt, left message with her husband that she would be due in Feb. 2017 for her next f/u (cath angio). JM

## 2015-06-28 ENCOUNTER — Encounter: Payer: Self-pay | Admitting: Pediatrics

## 2015-06-28 ENCOUNTER — Telehealth (HOSPITAL_COMMUNITY): Payer: Self-pay | Admitting: Interventional Radiology

## 2015-06-28 ENCOUNTER — Ambulatory Visit (INDEPENDENT_AMBULATORY_CARE_PROVIDER_SITE_OTHER): Payer: Medicare Other | Admitting: Pediatrics

## 2015-06-28 ENCOUNTER — Other Ambulatory Visit: Payer: Self-pay | Admitting: Family Medicine

## 2015-06-28 VITALS — BP 153/79 | HR 90 | Temp 97.6°F | Ht 64.0 in | Wt 191.2 lb

## 2015-06-28 DIAGNOSIS — J018 Other acute sinusitis: Secondary | ICD-10-CM | POA: Diagnosis not present

## 2015-06-28 MED ORDER — AZITHROMYCIN 250 MG PO TABS: ORAL_TABLET | ORAL | Status: DC

## 2015-06-28 NOTE — Telephone Encounter (Signed)
Called pt, left VM that her next f/u (cerebral angio) would be due in 10/2015 and to call me if she had any questions or concerns. JM

## 2015-06-28 NOTE — Patient Instructions (Signed)
Keep drinking lots fluids

## 2015-06-28 NOTE — Progress Notes (Signed)
Subjective:    Patient ID: Elizabeth [name], female    DOB: August 07, 1941, 74 y.o.   MRN: [id number]  CC: sinus pressure  HPI: Elizabeth [name] is a 74 y.o. female presenting on 06/28/2015 for Sinus pressure  Fever  Grandson had hand, foot and mouth disease About a week a ago Still with congestion and runny nose Taking allegra Nagging cough  Decreased appetite Lots of draining down throat at night  Relevant past medical, surgical, family and social history reviewed and updated as indicated. Interim medical history since our last visit reviewed. Allergies and medications reviewed and updated.   ROS: Per HPI unless specifically indicated above  Past Medical History Patient Active Problem List   Diagnosis Date Noted  . Type 2 diabetes mellitus with peripheral neuropathy ([place]) 11/27/2014  . CVA (cerebral vascular accident) ([place]) 02/27/2012  . Cerebral aneurysm 02/27/2012  . 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 Gait instability 02/27/2012  . Vision disturbance S/P CVA (cerebrovascular accident) 02/27/2012  . Dyspnea 11/14/2011  . Thyroid disease 10/12/2008  . Diabetes ([place]) 08/29/2008  . Hyperlipidemia 08/29/2008  . OBESITY, UNSPECIFIED 08/29/2008  . COPD 08/29/2008  . WEIGHT LOSS 08/29/2008  . DIARRHEA 08/29/2008  . HTN (hypertension) 08/28/2008  . ASTHMA 08/28/2008  . ESOPHAGEAL STRICTURE 08/28/2008  . GERD 08/28/2008  . HIATAL HERNIA 08/28/2008  . DIVERTICULOSIS, COLON 08/28/2008  . COLONIC POLYPS, HX OF 08/28/2008    Current Outpatient Prescriptions  Medication Sig Dispense Refill  . ALPRAZolam (XANAX) 0.25 MG tablet Take 1 tablet (0.25 mg total) by mouth 2 (two) times daily as needed. 60 tablet 1  . amLODipine (NORVASC) 10 MG tablet TAKE 1 TABLET BY MOUTH DAILY. 90 tablet 1  . azelastine (ASTELIN) 0.1 % nasal spray Place 1 spray into both nostrils 2 (two) times daily. Use in each nostril as directed 30 mL 11  . clopidogrel (PLAVIX) 75 MG tablet TAKE 1 TABLET BY  MOUTH DAILY. 90 tablet 1  . escitalopram (LEXAPRO) 20 MG tablet TAKE ONE TABLET BY MOUTH ONCE DAILY AS DIRECTED 90 tablet 3  . fexofenadine (ALLEGRA) 180 MG tablet Take 180 mg by mouth daily.    . fluticasone (FLONASE) 50 MCG/ACT nasal spray USE ONE SPRAY(S) INTO THE NOSE DAILY 16 g 4  . glucose blood test strip Check BS BID and PRN. DX E11.9 100 each 11  . hydrochlorothiazide (MICROZIDE) 12.5 MG capsule TAKE ONE CAPSULE BY MOUTH ONCE DAILY 90 capsule 2  . metaxalone (SKELAXIN) 800 MG tablet Take 1 tablet (800 mg total) by mouth at bedtime as needed for muscle spasms. 30 tablet 3  . metFORMIN (GLUCOPHAGE) 1000 MG tablet TAKE ONE TABLET BY MOUTH IN THE MORNING WITH BREAKFAST 90 tablet 3  . metoCLOPramide (REGLAN) 10 MG tablet TAKE ONE TABLET BY MOUTH 2 TIMES DAILY    . oxybutynin (DITROPAN XL) 15 MG 24 hr tablet Take 1 tablet (15 mg total) by mouth every morning. 90 tablet 3  . pantoprazole (PROTONIX) 40 MG tablet TAKE 1 TABLET BY MOUTH DAILY. 90 tablet 1  . rosuvastatin (CRESTOR) 20 MG tablet Take 1 tablet (20 mg total) by mouth daily.    . sitaGLIPtin (JANUVIA) 50 MG tablet Take 1 tablet (50 mg total) by mouth daily. 90 tablet 3  . Vitamin D, Ergocalciferol, (DRISDOL) 50000 UNITS CAPS capsule Take 1 capsule (50,000 Units total) by mouth every 7 (seven) days. 12 capsule 1  . azithromycin (ZITHROMAX) 250 MG tablet Take 2 the first day  and then one each day after. 6 tablet 0  . [DISCONTINUED] ramipril (ALTACE) 10 MG capsule Take 10 mg by mouth daily.       No current facility-administered medications for this visit.       Objective:    BP 153/79 mmHg  Pulse 90  Temp(Src) 97.6 F (36.4 C) (Oral)  Ht 5\' 4"  (1.626 m)  Wt 191 lb 3.2 oz (86.728 kg)  BMI 32.80 kg/m2  Wt Readings from Last 3 Encounters:  06/28/15 191 lb 3.2 oz (86.728 kg)  04/24/15 187 lb (84.823 kg)  03/09/15 189 lb 9.6 oz (86.002 kg)    Gen: NAD, alert, congested EYES: EOMI, no scleral injection or icterus ENT:  TMs  pearly gray b/l, OP without erythema, tenderness over maxillary and frontal sinuses b/l  LYMPH: no cervical LAD  CV: NRRR, normal S1/S2, no murmur, distal pulses 2+ b/l Resp: CTABL, no wheezes, normal WOB, no rales Ext: No edema, warm Neuro: Alert and oriented     Assessment & Plan:   Kiran was seen today for acute sinusitis.  Diagnoses and all orders for this visit:  Other acute sinusitis -     azithromycin (ZITHROMAX) 250 MG tablet; Take 2 the first day and then one each day after.    Follow up plan: Return if symptoms worsen or fail to improve.  Assunta Found, MD [facility] 06/28/2015, 8:14 AM

## 2015-07-26 ENCOUNTER — Other Ambulatory Visit: Payer: Self-pay | Admitting: *Deleted

## 2015-07-26 ENCOUNTER — Other Ambulatory Visit: Payer: Medicare Other

## 2015-07-26 DIAGNOSIS — E118 Type 2 diabetes mellitus with unspecified complications: Secondary | ICD-10-CM

## 2015-07-26 NOTE — Progress Notes (Signed)
Lab only 

## 2015-07-27 LAB — MICROALBUMIN / CREATININE URINE RATIO
Creatinine, Urine: 176.7 mg/dL
MICROALB/CREAT RATIO: 15.5 mg/g creat (ref 0.0–30.0)
Microalbumin, Urine: 27.4 ug/mL

## 2015-07-29 ENCOUNTER — Other Ambulatory Visit: Payer: Self-pay | Admitting: Physician Assistant

## 2015-07-29 ENCOUNTER — Other Ambulatory Visit: Payer: Self-pay | Admitting: Family Medicine

## 2015-07-30 NOTE — Telephone Encounter (Signed)
Last seen 06/28/15  Dr Evette Doffing  If approved route to nurse to call into [facility]   [phone]

## 2015-07-31 NOTE — Telephone Encounter (Signed)
rx called to pharmacy 

## 2015-07-31 NOTE — Telephone Encounter (Signed)
Forwarding To Dr. Laurance Flatten

## 2015-07-31 NOTE — Telephone Encounter (Signed)
This is okay to refill and please call this prescription in to her pharmacy

## 2015-08-07 ENCOUNTER — Telehealth: Payer: Self-pay | Admitting: Family Medicine

## 2015-08-07 NOTE — Telephone Encounter (Signed)
Please have the clinical pharmacist review this and recommend a viable less expensive alternative

## 2015-08-07 NOTE — Telephone Encounter (Signed)
Patient cannot afford Januvia, price is $163.00.  Please advise

## 2015-08-08 ENCOUNTER — Telehealth: Payer: Self-pay | Admitting: Pharmacist

## 2015-08-08 NOTE — Telephone Encounter (Signed)
Patient is in Medicare coverage gap.  Since we are close to end of year.  I am going to give her samples to last until the beginning of the year.   Given #35 Januvia 100mg  - take 1/2 tablet (=50mg ) by mouth once daily.

## 2015-08-08 NOTE — Telephone Encounter (Signed)
See other phone messages - patient called

## 2015-08-17 ENCOUNTER — Other Ambulatory Visit: Payer: Self-pay | Admitting: Family Medicine

## 2015-08-17 MED ORDER — AZITHROMYCIN 250 MG PO TABS: ORAL_TABLET | ORAL | Status: DC

## 2015-08-17 NOTE — Telephone Encounter (Signed)
Please advise and route to Pool A 

## 2015-08-17 NOTE — Telephone Encounter (Signed)
rx sent to pharmacy and pt aware. 

## 2015-08-17 NOTE — Telephone Encounter (Signed)
Please call in a Z-Pak and take as directed for this patient

## 2015-09-06 ENCOUNTER — Encounter: Payer: Self-pay | Admitting: Family Medicine

## 2015-09-06 ENCOUNTER — Ambulatory Visit (INDEPENDENT_AMBULATORY_CARE_PROVIDER_SITE_OTHER): Payer: Medicare Other

## 2015-09-06 ENCOUNTER — Ambulatory Visit (INDEPENDENT_AMBULATORY_CARE_PROVIDER_SITE_OTHER): Payer: Medicare Other | Admitting: Family Medicine

## 2015-09-06 VITALS — BP 126/66 | HR 76 | Temp 99.7°F | Ht 64.0 in | Wt 196.0 lb

## 2015-09-06 DIAGNOSIS — E785 Hyperlipidemia, unspecified: Secondary | ICD-10-CM

## 2015-09-06 DIAGNOSIS — J209 Acute bronchitis, unspecified: Secondary | ICD-10-CM

## 2015-09-06 DIAGNOSIS — I639 Cerebral infarction, unspecified: Secondary | ICD-10-CM | POA: Diagnosis not present

## 2015-09-06 DIAGNOSIS — E559 Vitamin D deficiency, unspecified: Secondary | ICD-10-CM | POA: Diagnosis not present

## 2015-09-06 DIAGNOSIS — K219 Gastro-esophageal reflux disease without esophagitis: Secondary | ICD-10-CM | POA: Diagnosis not present

## 2015-09-06 DIAGNOSIS — I10 Essential (primary) hypertension: Secondary | ICD-10-CM

## 2015-09-06 DIAGNOSIS — E1169 Type 2 diabetes mellitus with other specified complication: Secondary | ICD-10-CM | POA: Diagnosis not present

## 2015-09-06 DIAGNOSIS — J40 Bronchitis, not specified as acute or chronic: Secondary | ICD-10-CM

## 2015-09-06 DIAGNOSIS — I2510 Atherosclerotic heart disease of native coronary artery without angina pectoris: Secondary | ICD-10-CM

## 2015-09-06 DIAGNOSIS — E1142 Type 2 diabetes mellitus with diabetic polyneuropathy: Secondary | ICD-10-CM | POA: Diagnosis not present

## 2015-09-06 DIAGNOSIS — R748 Abnormal levels of other serum enzymes: Secondary | ICD-10-CM | POA: Diagnosis not present

## 2015-09-06 DIAGNOSIS — E079 Disorder of thyroid, unspecified: Secondary | ICD-10-CM

## 2015-09-06 LAB — POCT GLYCOSYLATED HEMOGLOBIN (HGB A1C): Hemoglobin A1C: 6.4

## 2015-09-06 MED ORDER — METHYLPREDNISOLONE ACETATE 80 MG/ML IJ SUSP
60.0000 mg | Freq: Once | INTRAMUSCULAR | Status: AC
  Administered 2015-09-06: 60 mg via INTRAMUSCULAR

## 2015-09-06 MED ORDER — AZITHROMYCIN 250 MG PO TABS: ORAL_TABLET | ORAL | Status: DC

## 2015-09-06 MED ORDER — PREDNISONE 10 MG PO TABS: ORAL_TABLET | ORAL | Status: DC

## 2015-09-06 NOTE — Patient Instructions (Addendum)
Medicare Annual Wellness Visit  [facility] and the medical providers at [facility] strive to bring you the best medical care.  In doing so we not only want to address your current medical conditions and concerns but also to detect new conditions early and prevent illness, disease and health-related problems.    Medicare offers a yearly Wellness Visit which allows our clinical staff to assess your need for preventative services including immunizations, lifestyle education, counseling to decrease risk of preventable diseases and screening for fall risk and other medical concerns.    This visit is provided free of charge (no copay) for all Medicare recipients. The clinical pharmacists at [facility] have begun to conduct these Wellness Visits which will also include a thorough review of all your medications.    As you primary medical provider recommend that you make an appointment for your Annual Wellness Visit if you have not done so already this year.  You may set up this appointment before you leave today or you may call back [phone] and schedule an appointment.  Please make sure when you call that you mention that you are scheduling your Annual Wellness Visit with the clinical pharmacist so that the appointment may be made for the proper length of time.     Continue current medications. Continue good therapeutic lifestyle changes which include good diet and exercise. Fall precautions discussed with patient. If an FOBT was given today- please return it to our front desk. If you are over 62 years old - you may need Prevnar 55 or the adult Pneumonia vaccine.  **Flu shots are available--- please call and schedule a FLU-CLINIC appointment**  After your visit with [place] today you will receive a survey in the mail or online from [employer] regarding your care with [place]. Please take a moment to fill this out. Your feedback is very  important to [place] as you can help [place] better understand your patient needs as well as improve your experience and satisfaction. WE CARE ABOUT YOU!!!   Continue to drink plenty of fluids and stay well hydrated Use Mucinex and nasal saline and Flonase regularly Take antibiotic as directed Take prednisone as directed and watch blood sugars more closely while on this medication Use inhaler 1 puff once daily and rinse mouth after using
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
## 2015-09-07 ENCOUNTER — Ambulatory Visit: Payer: Medicare Other | Admitting: Family Medicine

## 2015-09-07 LAB — VITAMIN D 25 HYDROXY (VIT D DEFICIENCY, FRACTURES): Vit D, 25-Hydroxy: 18.7 ng/mL — ABNORMAL LOW (ref 30.0–100.0)

## 2015-09-07 LAB — BMP8+EGFR
BUN/Creatinine Ratio: 12 (ref 11–26)
BUN: 8 mg/dL (ref 8–27)
CO2: 28 mmol/L (ref 18–29)
Calcium: 9.9 mg/dL (ref 8.7–10.3)
Chloride: 94 mmol/L — ABNORMAL LOW (ref 96–106)
Creatinine, Ser: 0.66 mg/dL (ref 0.57–1.00)
GFR calc Af Amer: 102 mL/min/{1.73_m2} (ref >59)
GFR calc non Af Amer: 89 mL/min/{1.73_m2} (ref >59)
Glucose: 128 mg/dL — ABNORMAL HIGH (ref 65–99)
Potassium: 3.7 mmol/L (ref 3.5–5.2)
Sodium: 134 mmol/L (ref 134–144)

## 2015-09-07 LAB — CBC WITH DIFFERENTIAL/PLATELET
Basophils Absolute: 0 10*3/uL (ref 0.0–0.2)
Basos: 1 %
EOS (ABSOLUTE): 0.1 10*3/uL (ref 0.0–0.4)
Eos: 2 %
Hematocrit: 39.8 % (ref 34.0–46.6)
Hemoglobin: 13.4 g/dL (ref 11.1–15.9)
Immature Grans (Abs): 0 10*3/uL (ref 0.0–0.1)
Immature Granulocytes: 0 %
Lymphocytes Absolute: 1.9 10*3/uL (ref 0.7–3.1)
Lymphs: 27 %
MCH: 27.5 pg (ref 26.6–33.0)
MCHC: 33.7 g/dL (ref 31.5–35.7)
MCV: 82 fL (ref 79–97)
Monocytes Absolute: 0.5 10*3/uL (ref 0.1–0.9)
Monocytes: 7 %
Neutrophils Absolute: 4.3 10*3/uL (ref 1.4–7.0)
Neutrophils: 63 %
Platelets: 357 10*3/uL (ref 150–379)
RBC: 4.87 x10E6/uL (ref 3.77–5.28)
RDW: 13.8 % (ref 12.3–15.4)
WBC: 6.9 10*3/uL (ref 3.4–10.8)

## 2015-09-07 LAB — NMR, LIPOPROFILE
Cholesterol: 299 mg/dL — ABNORMAL HIGH (ref 100–199)
HDL Cholesterol by NMR: 43 mg/dL (ref >39)
HDL Particle Number: 27.6 umol/L — ABNORMAL LOW (ref >=30.5)
LDL Particle Number: 2897 nmol/L — ABNORMAL HIGH (ref <1000)
LDL Size: 21.2 nm (ref >20.5)
LDL-C: 220 mg/dL — ABNORMAL HIGH (ref 0–99)
LP-IR Score: 61 — ABNORMAL HIGH (ref <=45)
Small LDL Particle Number: 1336 nmol/L — ABNORMAL HIGH (ref <=527)
Triglycerides by NMR: 180 mg/dL — ABNORMAL HIGH (ref 0–149)

## 2015-09-07 LAB — HEPATIC FUNCTION PANEL
ALT: 74 IU/L — ABNORMAL HIGH (ref 0–32)
AST: 62 IU/L — ABNORMAL HIGH (ref 0–40)
Albumin: 4.5 g/dL (ref 3.5–4.8)
Alkaline Phosphatase: 98 IU/L (ref 39–117)
Bilirubin Total: 0.5 mg/dL (ref 0.0–1.2)
Bilirubin, Direct: 0.15 mg/dL (ref 0.00–0.40)
Total Protein: 7.3 g/dL (ref 6.0–8.5)

## 2015-09-07 LAB — THYROID PANEL WITH TSH
Free Thyroxine Index: 1.9 (ref 1.2–4.9)
T3 Uptake Ratio: 20 % — ABNORMAL LOW (ref 24–39)
T4, Total: 9.6 ug/dL (ref 4.5–12.0)
TSH: 1.63 u[IU]/mL (ref 0.450–4.500)

## 2015-09-07 NOTE — Addendum Note (Signed)
Addended by: Shelbie Ammons on: 09/07/2015 12:41 PM   Modules accepted: Orders

## 2015-09-20 ENCOUNTER — Other Ambulatory Visit (HOSPITAL_COMMUNITY): Payer: Medicare Other

## 2015-10-09 ENCOUNTER — Ambulatory Visit (INDEPENDENT_AMBULATORY_CARE_PROVIDER_SITE_OTHER): Payer: PPO | Admitting: Cardiology

## 2015-10-09 ENCOUNTER — Encounter: Payer: Self-pay | Admitting: Cardiology

## 2015-10-09 VITALS — BP 146/75 | HR 84 | Ht 62.0 in | Wt 195.2 lb

## 2015-10-09 DIAGNOSIS — I10 Essential (primary) hypertension: Secondary | ICD-10-CM

## 2015-10-09 DIAGNOSIS — I2510 Atherosclerotic heart disease of native coronary artery without angina pectoris: Secondary | ICD-10-CM | POA: Diagnosis not present

## 2015-10-09 DIAGNOSIS — Z136 Encounter for screening for cardiovascular disorders: Secondary | ICD-10-CM

## 2015-10-09 DIAGNOSIS — E785 Hyperlipidemia, unspecified: Secondary | ICD-10-CM

## 2015-10-09 NOTE — Patient Instructions (Signed)
Continue all current medications. Your physician wants you to follow up in:  1 year.  You will receive a reminder letter in the mail one-two months in advance.  If you don't receive a letter, please call our office to schedule the follow up appointment   

## 2015-10-09 NOTE — Progress Notes (Signed)
Patient ID: Elizabeth [name], female   DOB: 08-Jan-1941, 75 y.o.   MRN: [id number]     Clinical Summary Elizabeth [name] is a 75 y.o.female seen today for follow up of the following medical problems   1. 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 No SOB, no DOE - compliant with meds  2. HTN - checks bp occasionally, typically around 130s/70s - compliant with meds - not on ACE or ARB due to allergy  3. Hyperlipidemia - intolerant to statins. She reports he was given samples of zetia by her pcp but has not started taking.  - 08/2015 LDL 220, TG 180, TC 299     Past Medical History  Diagnosis Date  . Diarrhea   . Obesity, unspecified   . Other and unspecified hyperlipidemia   . Unspecified essential hypertension   . Family history of malignant neoplasm of gastrointestinal tract   . Esophageal reflux   . Stricture and stenosis of esophagus   . Diverticulosis of colon (without mention of hemorrhage)   . Personal history of colonic polyps   . Active smoker   . Unspecified asthma(493.90)   . Type II or unspecified type diabetes mellitus without mention of complication, not stated as uncontrolled   . Chronic airway obstruction, not elsewhere classified   . Headache(784.0)     Irregular  . Anxiety     Takes Xanax for anxiety     Allergies  Allergen Reactions  . Banana Anaphylaxis  . Actos [Pioglitazone] Swelling  . Benicar [Olmesartan Medoxomil] Other (See Comments)    Feel bad  . Chantix [Varenicline] Other (See Comments)    "Worked opposite."  . Codeine Nausea And Vomiting  . Diovan [Valsartan] Other (See Comments)    Feel bad  . Latex     REACTION: red rash  . Lipitor [Atorvastatin] Other (See Comments)    REACTION:  Joint pain  . Spinach     Stomach problems  . Vicodin [Hydrocodone-Acetaminophen] Nausea And Vomiting  . Zocor [Simvastatin] Other (See Comments)    REACTION:  "made liver function incorrectly"    . Ace Inhibitors Cough  . Celebrex [Celecoxib] Rash  . Penicillins Rash  . Sulfonamide Derivatives Rash     Current Outpatient Prescriptions  Medication Sig Dispense Refill  . ALPRAZolam (XANAX) 0.25 MG tablet TAKE 1 TABLET BY MOUTH TWICE DAILY AS NEEDED 60 tablet 1  . amLODipine (NORVASC) 10 MG tablet TAKE 1 TABLET BY MOUTH DAILY. 90 tablet 1  . azelastine (ASTELIN) 0.1 % nasal spray Place 1 spray into both nostrils 2 (two) times daily. Use in each nostril as directed 30 mL 11  . azithromycin (ZITHROMAX) 250 MG tablet 2 pills the first day then one daily for infection until completed 6 tablet 0  . clopidogrel (PLAVIX) 75 MG tablet TAKE 1 TABLET BY MOUTH DAILY. 90 tablet 0  . escitalopram (LEXAPRO) 20 MG tablet TAKE ONE TABLET BY MOUTH ONCE DAILY AS DIRECTED (Patient taking differently: 10 mg daily) 90 tablet 3  . fexofenadine (ALLEGRA) 180 MG tablet Take 180 mg by mouth daily.    . fluticasone (FLONASE) 50 MCG/ACT nasal spray USE ONE SPRAY(S) INTO THE NOSE DAILY 16 g 4  . glucose blood test strip Check BS BID and PRN. DX E11.9 100 each 11  . hydrochlorothiazide (MICROZIDE) 12.5 MG capsule TAKE 1 CAPSULE BY MOUTH ONCE DAILY 90 capsule 0  . metaxalone (SKELAXIN) 800 MG tablet Take 1  tablet (800 mg total) by mouth at bedtime as needed for muscle spasms. 30 tablet 3  . metFORMIN (GLUCOPHAGE) 1000 MG tablet TAKE ONE TABLET BY MOUTH IN THE MORNING WITH BREAKFAST 90 tablet 3  . metoCLOPramide (REGLAN) 10 MG tablet TAKE ONE TABLET BY MOUTH 1 TIME DAILY    . oxybutynin (DITROPAN XL) 15 MG 24 hr tablet Take 1 tablet (15 mg total) by mouth every morning. 90 tablet 3  . pantoprazole (PROTONIX) 40 MG tablet TAKE 1 TABLET BY MOUTH DAILY. 90 tablet 1  . predniSONE (DELTASONE) 10 MG tablet 1 tablet 4 times a day for 2 days,  1 tablet 3 times a day for 2 days,  1 tablet 2 times a day for 2 days, 1 tablet daily for 2 days 20 tablet 0  . sitaGLIPtin (JANUVIA) 50 MG tablet Take 1 tablet (50 mg total) by  mouth daily. 90 tablet 3  . Vitamin D, Ergocalciferol, (DRISDOL) 50000 UNITS CAPS capsule Take 1 capsule (50,000 Units total) by mouth every 7 (seven) days. 12 capsule 1  . [DISCONTINUED] ramipril (ALTACE) 10 MG capsule Take 10 mg by mouth daily.       No current facility-administered medications for this visit.     Past Surgical History  Procedure Laterality Date  . Total abdominal hysterectomy    . Tonsillectomy    . Lumbar disc surgery    . Knee arthroscopy      left  . Appendectomy    . Cardiac catheterization  2006    LAD: 30%, RCA : 20%, normal EF, elevated LVEDP  . Cardiac catheterization    . Back surgery      Spinal surgery     Allergies  Allergen Reactions  . Banana Anaphylaxis  . Actos [Pioglitazone] Swelling  . Benicar [Olmesartan Medoxomil] Other (See Comments)    Feel bad  . Chantix [Varenicline] Other (See Comments)    "Worked opposite."  . Codeine Nausea And Vomiting  . Diovan [Valsartan] Other (See Comments)    Feel bad  . Latex     REACTION: red rash  . Lipitor [Atorvastatin] Other (See Comments)    REACTION:  Joint pain  . Spinach     Stomach problems  . Vicodin [Hydrocodone-Acetaminophen] Nausea And Vomiting  . Zocor [Simvastatin] Other (See Comments)    REACTION:  "made liver function incorrectly"  . Ace Inhibitors Cough  . Celebrex [Celecoxib] Rash  . Penicillins Rash  . Sulfonamide Derivatives Rash      Family History  Problem Relation Age of Onset  . Colon cancer Other     first cousin, paternal aunt and uncle  . Cancer Paternal Grandmother     gastric  . Breast cancer Maternal Aunt     several paternal cousins  . Diabetes Mother   . Heart disease Mother   . Heart disease Maternal Grandmother   . Thyroid cancer Other     aunt  . Thyroid disease Sister     uncertain type-not cancer     Social History Elizabeth [name] reports that she quit smoking about 3 years ago. Her smoking use included Cigarettes. She started smoking about 32  years ago. She has a 20 pack-year smoking history. She has never used smokeless tobacco. Elizabeth [name] reports that she does not drink alcohol.   Review of Systems CONSTITUTIONAL: No weight loss, fever, chills, weakness or fatigue.  HEENT: Eyes: No visual loss, blurred vision, double vision or yellow sclerae.No hearing loss, sneezing, congestion, runny nose  or sore throat.  SKIN: No rash or itching.  CARDIOVASCULAR: per hpi RESPIRATORY: No shortness of breath, cough or sputum.  GASTROINTESTINAL: No anorexia, nausea, vomiting or diarrhea. No abdominal pain or blood.  GENITOURINARY: No burning on urination, no polyuria NEUROLOGICAL: No headache, dizziness, syncope, paralysis, ataxia, numbness or tingling in the extremities. No change in bowel or bladder control.  MUSCULOSKELETAL: No muscle, back pain, joint pain or stiffness.  LYMPHATICS: No enlarged nodes. No history of splenectomy.  PSYCHIATRIC: No history of depression or anxiety.  ENDOCRINOLOGIC: No reports of sweating, cold or heat intolerance. No polyuria or polydipsia.  Marland Kitchen   Physical Examination Filed Vitals:   10/09/15 1613 10/09/15 1618  BP: 154/77 146/75  Pulse: 83 84   Filed Vitals:   10/09/15 1613  Height: 5\' 2"  (1.575 m)  Weight: 195 lb 3.2 oz (88.542 kg)    Gen: resting comfortably, no acute distress HEENT: no scleral icterus, pupils equal round and reactive, no palptable cervical adenopathy,  CV: RRR, no m/r/g, no jvd Resp: Clear to auscultation bilaterally GI: abdomen is soft, non-tender, non-distended, normal bowel sounds, no hepatosplenomegaly MSK: extremities are warm, no edema.  Skin: warm, no rash Neuro:  no focal deficits Psych: appropriate affect   Diagnostic Studies 02/2012 Echo: LVEF 60-65%, mild LVH, no WMAs, grade I diastolic dysfunction,   0000000 Cath DATE OF PROCEDURE: 12/26/2004  DATE OF DISCHARGE: 12/26/2004  CARDIAC CATHETERIZATION  INDICATIONS: Ms. Guglielmo is a pleasant 75 year old woman  with a history of  hypertension, dyslipidemia, type 2 diabetes mellitus, tobacco use, and  asthma. She has had recent problems with mild dyspnea and lower extremity  edema. Given her cardiac risk factor profile, she is referred for diagnostic  coronary angiography and left ventriculography to assess coronary anatomy  and left ventricular function.  PROCEDURE:  1. Left heart catheterization.  2. Selective coronary angiography.  3. Left ventriculography.  DESCRIPTION OF PROCEDURE: The area about the right femoral artery was  anesthetized 1% lidocaine and a 4-French sheath was placed in the right  femoral artery via modified Seldinger technique. Standard preformed 4-French  JL-4 and JR-4 catheters used for selective coronary angiography and angled  pigtail catheters used for left heart catheterization and left  ventriculography. All exchanges were made over wire. The patient tolerated  procedure well without immediate complications.  HEMODYNAMIC RESULTS:  1. Left ventricle 151/25 mmHg.  2. Aorta 152/76 mmHg.  ANGIOGRAPHIC FINDINGS:  1. Left main coronary artery is free of significant flow-limiting coronary  atherosclerosis.  2. The left anterior descending is a medium caliber vessel with three small  diagonal branches. There are minor Luminal irregularities noted  throughout this system with approximately 30% diffuse mid-vessel  stenosis. There is also a 30-40% ostial stenosis involving the first  diagonal branch. No flow-limiting stenoses are noted.  3. The circumflex coronary artery is a medium caliber vessel. There is a  very small branch in the AV groove and two larger bifurcating obtuse  marginal branches. Minor Luminal irregularities are noted without flow-  limiting stenoses.  4. The right coronary artery is a medium caliber vessel with posterior  descending branch. Minor Luminal irregularities are noted. There is a  20% stenosis in the proximal  to mid-vessel.  LEFT VENTRICULOGRAPHY: Left ventriculography was performed in the RAO  projection revealing an ejection fraction approximately 65% in the setting  of ventricular ectopy without significant mitral regurgitation or focal wall  motion abnormality.  DIAGNOSES:  1. Mild coronary atherosclerosis as outlined with no  flow-limiting stenoses  in the major epicardial vessels.  2. Left ventricular ejection fraction of approximately 65% in the setting  of ventricular ectopy. No significant mitral regurgitation is noted. The  left ventricular end-diastolic pressure is increased at 25 mmHg. At this  point would suspect an element of diastolic dysfunction particularly  given elevated left renal end-diastolic pressure.  The patient is already on an ACE inhibitor and diuretic. We will plan to  continue maximizing medical therapy. We will arrange a follow-up 2-D  echocardiogram for diastolic parameters and also to exclude  significantly elevated pulmonary pressures. She will have follow-up in  the office to review this and her progress.  07/26/13 Clinic EKG: sinus rhythm, normal axis, RBBB    Assessment and Plan  1. Non-obstructive CAD - no current symptoms - continue current meds, continue risk factor modificaiton  2. HTN - At goal, continue current medications  3. Hyperlipidemia - intolerant to statins, she will try zetia.       Arnoldo Lenis, M.D

## 2015-10-11 ENCOUNTER — Other Ambulatory Visit: Payer: Self-pay | Admitting: Family Medicine

## 2015-10-11 ENCOUNTER — Other Ambulatory Visit: Payer: Self-pay | Admitting: Pediatrics

## 2015-10-11 ENCOUNTER — Ambulatory Visit: Payer: Self-pay | Admitting: Family Medicine

## 2015-10-12 ENCOUNTER — Encounter: Payer: Self-pay | Admitting: Family Medicine

## 2015-10-25 ENCOUNTER — Ambulatory Visit (HOSPITAL_COMMUNITY)
Admission: RE | Admit: 2015-10-25 | Discharge: 2015-10-25 | Disposition: A | Discharge status: Home / Self Care | Payer: PPO | Source: Ambulatory Visit | Attending: Family Medicine | Admitting: Family Medicine

## 2015-10-25 DIAGNOSIS — I10 Essential (primary) hypertension: Secondary | ICD-10-CM | POA: Diagnosis not present

## 2015-10-25 DIAGNOSIS — R748 Abnormal levels of other serum enzymes: Secondary | ICD-10-CM | POA: Diagnosis not present

## 2015-10-25 DIAGNOSIS — E119 Type 2 diabetes mellitus without complications: Secondary | ICD-10-CM | POA: Insufficient documentation

## 2015-10-25 DIAGNOSIS — F17200 Nicotine dependence, unspecified, uncomplicated: Secondary | ICD-10-CM | POA: Insufficient documentation

## 2015-10-25 DIAGNOSIS — J449 Chronic obstructive pulmonary disease, unspecified: Secondary | ICD-10-CM | POA: Insufficient documentation

## 2015-10-25 DIAGNOSIS — E669 Obesity, unspecified: Secondary | ICD-10-CM | POA: Diagnosis not present

## 2015-10-25 DIAGNOSIS — R7989 Other specified abnormal findings of blood chemistry: Secondary | ICD-10-CM | POA: Insufficient documentation

## 2015-10-25 DIAGNOSIS — K219 Gastro-esophageal reflux disease without esophagitis: Secondary | ICD-10-CM | POA: Insufficient documentation

## 2015-10-25 DIAGNOSIS — N281 Cyst of kidney, acquired: Secondary | ICD-10-CM | POA: Diagnosis not present

## 2015-10-25 DIAGNOSIS — E785 Hyperlipidemia, unspecified: Secondary | ICD-10-CM | POA: Diagnosis not present

## 2015-10-26 ENCOUNTER — Telehealth: Payer: Self-pay

## 2015-10-26 NOTE — Telephone Encounter (Signed)
[facility] to x-ray 

## 2015-10-29 NOTE — Telephone Encounter (Signed)
Pt aware of results 

## 2015-10-31 ENCOUNTER — Other Ambulatory Visit: Payer: Self-pay | Admitting: Family Medicine

## 2015-11-05 NOTE — Telephone Encounter (Signed)
Last filled 10/11/15, last seen 09/06/15. Call in at [facility]

## 2015-11-20 ENCOUNTER — Other Ambulatory Visit (HOSPITAL_COMMUNITY): Payer: Self-pay | Admitting: Interventional Radiology

## 2015-11-20 ENCOUNTER — Telehealth (HOSPITAL_COMMUNITY): Payer: Self-pay

## 2015-11-20 DIAGNOSIS — G459 Transient cerebral ischemic attack, unspecified: Secondary | ICD-10-CM

## 2015-11-20 DIAGNOSIS — I6350 Cerebral infarction due to unspecified occlusion or stenosis of unspecified cerebral artery: Secondary | ICD-10-CM

## 2015-11-20 DIAGNOSIS — I671 Cerebral aneurysm, nonruptured: Secondary | ICD-10-CM

## 2015-11-20 NOTE — Telephone Encounter (Signed)
Called and left a message with pt's husband to her pt call back. Pt is due for angiogram. AW

## 2015-11-30 ENCOUNTER — Other Ambulatory Visit: Payer: Self-pay | Admitting: Family Medicine

## 2015-12-05 ENCOUNTER — Telehealth: Payer: Self-pay | Admitting: *Deleted

## 2015-12-05 MED ORDER — METAXALONE 800 MG PO TABS: 800.0000 mg | ORAL_TABLET | Freq: Every day | ORAL | Status: DC

## 2015-12-05 MED ORDER — ESCITALOPRAM OXALATE 10 MG PO TABS: 10.0000 mg | ORAL_TABLET | Freq: Every day | ORAL | Status: DC

## 2015-12-05 NOTE — Telephone Encounter (Signed)
done

## 2015-12-07 ENCOUNTER — Other Ambulatory Visit: Payer: Self-pay | Admitting: *Deleted

## 2015-12-07 MED ORDER — OSELTAMIVIR PHOSPHATE 75 MG PO CAPS: 75.0000 mg | ORAL_CAPSULE | Freq: Two times a day (BID) | ORAL | Status: DC

## 2015-12-18 ENCOUNTER — Telehealth: Payer: Self-pay

## 2015-12-18 ENCOUNTER — Other Ambulatory Visit: Payer: Self-pay | Admitting: Radiology

## 2015-12-18 DIAGNOSIS — R52 Pain, unspecified: Secondary | ICD-10-CM

## 2015-12-18 MED ORDER — METAXALONE 800 MG PO TABS: 800.0000 mg | ORAL_TABLET | Freq: Every day | ORAL | Status: DC

## 2015-12-18 NOTE — Patient Instructions (Signed)
NPO after MN, take am meds with sips, needs driver

## 2015-12-18 NOTE — Telephone Encounter (Signed)
Re -ordered and diagnosis changed

## 2015-12-18 NOTE — Telephone Encounter (Signed)
Insurance denied Metaxalone  The requested drug must be prescribed for a medically accepted indication such as acute Musculoskeletal conditions  Your request was denied because it is being used for chronic musculoskeletal condition

## 2015-12-19 ENCOUNTER — Other Ambulatory Visit: Payer: Self-pay | Admitting: General Surgery

## 2015-12-20 ENCOUNTER — Other Ambulatory Visit (HOSPITAL_COMMUNITY): Payer: Self-pay | Admitting: Interventional Radiology

## 2015-12-20 ENCOUNTER — Ambulatory Visit (HOSPITAL_COMMUNITY)
Admission: RE | Admit: 2015-12-20 | Discharge: 2015-12-20 | Disposition: A | Discharge status: Home / Self Care | Payer: PPO | Source: Ambulatory Visit | Attending: Interventional Radiology | Admitting: Interventional Radiology

## 2015-12-20 DIAGNOSIS — E785 Hyperlipidemia, unspecified: Secondary | ICD-10-CM | POA: Insufficient documentation

## 2015-12-20 DIAGNOSIS — F419 Anxiety disorder, unspecified: Secondary | ICD-10-CM | POA: Diagnosis not present

## 2015-12-20 DIAGNOSIS — Z7982 Long term (current) use of aspirin: Secondary | ICD-10-CM | POA: Insufficient documentation

## 2015-12-20 DIAGNOSIS — J45909 Unspecified asthma, uncomplicated: Secondary | ICD-10-CM | POA: Diagnosis not present

## 2015-12-20 DIAGNOSIS — Z880 Allergy status to penicillin: Secondary | ICD-10-CM | POA: Diagnosis not present

## 2015-12-20 DIAGNOSIS — I671 Cerebral aneurysm, nonruptured: Secondary | ICD-10-CM

## 2015-12-20 DIAGNOSIS — I6350 Cerebral infarction due to unspecified occlusion or stenosis of unspecified cerebral artery: Secondary | ICD-10-CM

## 2015-12-20 DIAGNOSIS — E669 Obesity, unspecified: Secondary | ICD-10-CM | POA: Diagnosis not present

## 2015-12-20 DIAGNOSIS — K219 Gastro-esophageal reflux disease without esophagitis: Secondary | ICD-10-CM | POA: Insufficient documentation

## 2015-12-20 DIAGNOSIS — E119 Type 2 diabetes mellitus without complications: Secondary | ICD-10-CM | POA: Diagnosis not present

## 2015-12-20 DIAGNOSIS — Z79899 Other long term (current) drug therapy: Secondary | ICD-10-CM | POA: Diagnosis not present

## 2015-12-20 DIAGNOSIS — G459 Transient cerebral ischemic attack, unspecified: Secondary | ICD-10-CM

## 2015-12-20 DIAGNOSIS — Z87891 Personal history of nicotine dependence: Secondary | ICD-10-CM | POA: Insufficient documentation

## 2015-12-20 DIAGNOSIS — Z6834 Body mass index (BMI) 34.0-34.9, adult: Secondary | ICD-10-CM | POA: Insufficient documentation

## 2015-12-20 DIAGNOSIS — J449 Chronic obstructive pulmonary disease, unspecified: Secondary | ICD-10-CM | POA: Diagnosis not present

## 2015-12-20 HISTORY — PX: CEREBRAL ANGIOGRAM: SHX1326

## 2015-12-20 LAB — BASIC METABOLIC PANEL
Anion gap: 13 (ref 5–15)
BUN: 7 mg/dL (ref 6–20)
CO2: 26 mmol/L (ref 22–32)
Calcium: 9.9 mg/dL (ref 8.9–10.3)
Chloride: 96 mmol/L — ABNORMAL LOW (ref 101–111)
Creatinine, Ser: 0.71 mg/dL (ref 0.44–1.00)
GFR calc Af Amer: >60 mL/min (ref >60)
GFR calc non Af Amer: >60 mL/min (ref >60)
Glucose, Bld: 141 mg/dL — ABNORMAL HIGH (ref 65–99)
Potassium: 3.6 mmol/L (ref 3.5–5.1)
Sodium: 135 mmol/L (ref 135–145)

## 2015-12-20 LAB — APTT: aPTT: 32 seconds (ref 24–37)

## 2015-12-20 LAB — CBC
HCT: 38.2 % (ref 36.0–46.0)
Hemoglobin: 12.9 g/dL (ref 12.0–15.0)
MCH: 27.3 pg (ref 26.0–34.0)
MCHC: 33.8 g/dL (ref 30.0–36.0)
MCV: 80.8 fL (ref 78.0–100.0)
Platelets: 331 10*3/uL (ref 150–400)
RBC: 4.73 MIL/uL (ref 3.87–5.11)
RDW: 14.3 % (ref 11.5–15.5)
WBC: 6.7 10*3/uL (ref 4.0–10.5)

## 2015-12-20 LAB — PROTIME-INR
INR: 1.17 (ref 0.00–1.49)
Prothrombin Time: 15.1 seconds (ref 11.6–15.2)

## 2015-12-20 MED ORDER — HEPARIN SODIUM (PORCINE) 1000 UNIT/ML IJ SOLN
INTRAMUSCULAR | Status: AC | PRN
  Administered 2015-12-20: 1000 [IU] via INTRAVENOUS

## 2015-12-20 MED ORDER — FENTANYL CITRATE (PF) 100 MCG/2ML IJ SOLN
INTRAMUSCULAR | Status: AC | PRN
  Administered 2015-12-20: 25 ug via INTRAVENOUS

## 2015-12-20 MED ORDER — SODIUM CHLORIDE 0.9 % IV SOLN
Freq: Once | INTRAVENOUS | Status: AC
  Administered 2015-12-20: 08:00:00 via INTRAVENOUS

## 2015-12-20 MED ORDER — FENTANYL CITRATE (PF) 100 MCG/2ML IJ SOLN
INTRAMUSCULAR | Status: AC
  Filled 2015-12-20: qty 2

## 2015-12-20 MED ORDER — IOPAMIDOL (ISOVUE-300) INJECTION 61%
INTRAVENOUS | Status: AC
  Administered 2015-12-20: 80 mL
  Filled 2015-12-20: qty 150

## 2015-12-20 MED ORDER — SODIUM CHLORIDE 0.9 % IV SOLN
INTRAVENOUS | Status: AC | PRN
  Administered 2015-12-20: 75 mL/h via INTRAVENOUS

## 2015-12-20 MED ORDER — MIDAZOLAM HCL 2 MG/2ML IJ SOLN
INTRAMUSCULAR | Status: AC | PRN
  Administered 2015-12-20: 1 mg via INTRAVENOUS

## 2015-12-20 MED ORDER — SODIUM CHLORIDE 0.9 % IV SOLN: INTRAVENOUS | Status: DC

## 2015-12-20 MED ORDER — LIDOCAINE HCL 1 % IJ SOLN
INTRAMUSCULAR | Status: AC
  Filled 2015-12-20: qty 20

## 2015-12-20 MED ORDER — IOPAMIDOL (ISOVUE-300) INJECTION 61%
INTRAVENOUS | Status: AC
  Administered 2015-12-20: 10 mL
  Filled 2015-12-20: qty 50

## 2015-12-20 MED ORDER — HEPARIN SOD (PORK) LOCK FLUSH 100 UNIT/ML IV SOLN
INTRAVENOUS | Status: AC
  Filled 2015-12-20: qty 15

## 2015-12-20 MED ORDER — MIDAZOLAM HCL 2 MG/2ML IJ SOLN
INTRAMUSCULAR | Status: AC
  Filled 2015-12-20: qty 2

## 2015-12-20 NOTE — Discharge Instructions (Signed)
Cerebral Angiogram, Care After °Refer to this sheet in the next few weeks. These instructions provide you with information on caring for yourself after your procedure. Your health care provider may also give you more specific instructions. Your treatment has been planned according to current medical practices, but problems sometimes occur. Call your health care provider if you have any problems or questions after your procedure. °WHAT TO EXPECT AFTER THE PROCEDURE °After your procedure, it is typical to have the following: °· Bruising at the catheter insertion site that usually fades within 1-2 weeks. °· Blood collecting in the tissue (hematoma) that may be painful to the touch. It should usually decrease in size and tenderness within 1-2 weeks. °· A mild headache. °HOME CARE INSTRUCTIONS °· Take medicines only as directed by your health care provider. °· You may shower 24-48 hours after the procedure or as directed by your health care provider. Remove the bandage (dressing) and gently wash the site with plain soap and water. Pat the area dry with a clean towel. Do not rub the site, because this may cause bleeding. °· Do not take baths, swim, or use a hot tub until your health care provider approves. °· Check your insertion site every day for redness, swelling, or drainage. °· Do not apply powder or lotion to the site. °· Do not lift over 10 lb (4.5 kg) for 5 days after your procedure or as directed by your health care provider. °· Ask your health care provider when it is okay to: °¨ Return to work or school. °¨ Resume usual physical activities or sports. °¨ Resume sexual activity. °· Do not drive home if you are discharged the same day as the procedure. Have someone else drive you. °· You may drive 24 hours after the procedure unless otherwise instructed by your health care provider. °· Do not operate machinery or power tools for 24 hours after the procedure or as directed by your health care provider. °· If your  procedure was done as an outpatient procedure, which means that you went home the same day as your procedure, a responsible adult should be with you for the first 24 hours after you arrive home. °· Keep all follow-up visits as directed by your health care provider. This is important. °SEEK MEDICAL CARE IF: °· You have a fever. °· You have chills. °· You have increased bleeding from the catheter insertion site. Hold pressure on the site. °SEEK IMMEDIATE MEDICAL CARE IF: °· You have vision changes or loss of vision. °· You have numbness or weakness on one side of your body. °· You have difficulty talking, or you have slurred speech or cannot speak (aphasia). °· You feel confused or have difficulty remembering. °· You have unusual pain at the catheter insertion site. °· You have redness, warmth, or swelling at the catheter insertion site. °· You have drainage (other than a small amount of blood on the dressing) from the catheter insertion site. °· The catheter insertion site is bleeding, and the bleeding does not stop after 30 minutes of holding steady pressure on the site. °These symptoms may represent a serious problem that is an emergency. Do not wait to see if the symptoms will go away. Get medical help right away. Call your local emergency services (911 in [place] Do not drive yourself to the hospital. °  °This information is not intended to replace advice given to you by your health care provider. Make sure you discuss any questions you have with your   health care provider. °  °Document Released: 01/09/2014 Document Revised: 06/13/2014 Document Reviewed: 01/09/2014 °Elsevier Interactive Patient Education ©2016 [employer] ° °

## 2015-12-20 NOTE — Procedures (Signed)
S/P 4 vessel cerebral arteriogram. RT CFA approach. Findings. 1.Obliterated LT ACA/ACOM aneurysm. 2.distal migrated coil in RTpericallosal artery stable. 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

## 2015-12-20 NOTE — H&P (Signed)
Chief Complaint: Patient was seen in consultation today for cerebral aerteriogram at the request of Dr Redge Gainer  Referring Physician(s): Dr Redge Gainer  Supervising Physician: Luanne Bras  History of Present Illness: Elizabeth [name] is a 75 y.o. 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 Stable residual flow signal along the right lateral aspect of the anterior communicating artery coil pack since 2015. Recommend continued surveillance. 2. No acute intracranial abnormality. Stable MRI appearance of the brain with moderately advanced chronic small vessel ischemia.  Pt has no complaints; no symptoms +smoker She does want Dr Estanislado Pandy to know that she suffered a fall down 40 stairs in 2010 Evaluation at [facility] was deemed all negative Questioning if injury has any significance with aneurysm and CVA  Past Medical History  Diagnosis Date  . Diarrhea   . Obesity, unspecified   . Other and unspecified hyperlipidemia   . Unspecified essential hypertension   . Family history of malignant neoplasm of gastrointestinal tract   . Esophageal reflux   . Stricture and stenosis of esophagus   . Diverticulosis of colon (without mention of hemorrhage)   . Personal history of colonic polyps   . Active smoker   . Unspecified asthma(493.90)   . Type II or unspecified type diabetes mellitus without mention of complication, not stated as uncontrolled   . Chronic airway obstruction, not elsewhere classified   . Headache(784.0)     Irregular  . Anxiety     Takes Xanax for anxiety    Past Surgical History  Procedure Laterality Date  . Total abdominal hysterectomy    . Tonsillectomy    . Lumbar disc surgery    . Knee arthroscopy      left  . Appendectomy    . Cardiac catheterization  2006    LAD: 30%, RCA : 20%, normal EF, elevated LVEDP  . Cardiac  catheterization    . Back surgery      Spinal surgery    Allergies: Banana; Actos; Benicar; Chantix; Codeine; Diovan; Latex; Lipitor; Spinach; Vicodin; Zocor; Ace inhibitors; Celebrex; Penicillins; and Sulfonamide derivatives  Medications: Prior to Admission medications   Medication Sig Start Date End Date Taking? Authorizing Provider  ALPRAZolam (XANAX) 0.25 MG tablet TAKE 1 TABLET BY MOUTH TWICE DAILY Patient taking differently: TAKE 1 TABLET BY MOUTH AT BEDTIME 11/05/15  Yes Chipper Herb, MD  amLODipine (NORVASC) 10 MG tablet TAKE 1 TABLET BY MOUTH DAILY. 10/11/15  Yes Chipper Herb, MD  aspirin 325 MG EC tablet Take 325 mg by mouth daily.   Yes Historical Provider, MD  azelastine (ASTELIN) 0.1 % nasal spray Place 1 spray into both nostrils 2 (two) times daily. Use in each nostril as directed 12/27/14  Yes Chipper Herb, MD  chlorpheniramine (CHLOR-TRIMETON) 4 MG tablet Take 2 mg by mouth daily as needed for allergies.   Yes Historical Provider, MD  CINNAMON PO Take 2,000 mg by mouth daily.   Yes Historical Provider, MD  clopidogrel (PLAVIX) 75 MG tablet TAKE 1 TABLET BY MOUTH DAILY. 10/11/15  Yes Chipper Herb, MD  escitalopram (LEXAPRO) 10 MG tablet Take 1 tablet (10 mg total) by mouth daily. 12/05/15  Yes Chipper Herb, MD  glucose blood test strip Check BS BID and PRN. DX E11.9 01/08/15  Yes Chipper Herb, MD  hydrochlorothiazide (MICROZIDE) 12.5 MG capsule TAKE 1 CAPSULE BY  MOUTH EVERY DAY 10/11/15  Yes Chipper Herb, MD  JANUVIA 50 MG tablet TAKE 1 TABLET BY MOUTH EVERY DAY 11/30/15  Yes Chipper Herb, MD  metaxalone (SKELAXIN) 800 MG tablet Take 1 tablet (800 mg total) by mouth at bedtime. Patient taking differently: Take 400 mg by mouth at bedtime.  12/18/15  Yes Chipper Herb, MD  metFORMIN (GLUCOPHAGE) 1000 MG tablet TAKE 1 TABLET BY MOUTH EVERY MORNING WITH BREAKFAST 11/30/15  Yes Chipper Herb, MD  metoCLOPramide (REGLAN) 10 MG tablet TAKE ONE TABLET BY MOUTH 1 TIME DAILY AS  NEEDED 06/08/14  Yes Chipper Herb, MD  Omega-3 Fatty Acids (FISH OIL) 1000 MG CAPS Take 1 capsule by mouth daily.   Yes Historical Provider, MD  pantoprazole (PROTONIX) 40 MG tablet TAKE 1 TABLET BY MOUTH DAILY. Patient taking differently: TAKE 1 TABLET BY MOUTH EVERY 3RD DAY. 02/28/15  Yes Lodema Pilot, PA-C  Vitamin D, Ergocalciferol, (DRISDOL) 50000 UNITS CAPS capsule Take 1 capsule (50,000 Units total) by mouth every 7 (seven) days. 05/23/15  Yes Chipper Herb, MD     Family History  Problem Relation Age of Onset  . Colon cancer Other     first cousin, paternal aunt and uncle  . Cancer Paternal Grandmother     gastric  . Breast cancer Maternal Aunt     several paternal cousins  . Diabetes Mother   . Heart disease Mother   . Heart disease Maternal Grandmother   . Thyroid cancer Other     aunt  . Thyroid disease Sister     uncertain type-not cancer    Social History   Social History  . Marital Status: Married    Spouse Name: N/A  . Number of Children: N/A  . Years of Education: N/A   Occupational History  . RN     [employer]   Social History Main Topics  . Smoking status: Former Smoker -- 1.00 packs/day for 20 years    Types: Cigarettes    Start date: 01/15/1983    Quit date: 02/27/2012  . Smokeless tobacco: Never Used  . Alcohol Use: No  . Drug Use: No  . Sexual Activity: Not on file   Other Topics Concern  . Not on file   Social History Narrative     Review of Systems: A 12 point ROS discussed and pertinent positives are indicated in the HPI above.  All other systems are negative.  Review of Systems  Constitutional: Negative for fever, activity change, appetite change and fatigue.  HENT: Negative for tinnitus and trouble swallowing.   Eyes: Negative for visual disturbance.  Respiratory: Negative for shortness of breath.   Gastrointestinal: Negative for abdominal pain.  Musculoskeletal: Negative for back pain.  Neurological: Negative for  dizziness, tremors, seizures, syncope, facial asymmetry, speech difficulty, weakness, light-headedness, numbness and headaches.  Psychiatric/Behavioral: Negative for behavioral problems and confusion.    Vital Signs: BP 149/85 mmHg  Pulse 72  Temp(Src) 98.6 F (37 C) (Oral)  Resp 18  Ht 5\' 2"  (1.575 m)  Wt 190 lb (86.183 kg)  BMI 34.74 kg/m2  SpO2 100%  Physical Exam  Constitutional: She is oriented to person, place, and time.  Eyes: EOM are normal.  Neck: Normal range of motion.  Cardiovascular: Normal rate, regular rhythm and normal heart sounds.   No murmur heard. Pulmonary/Chest: Effort normal and breath sounds normal. She has no wheezes.  Abdominal: Soft. Bowel sounds are normal. There is  no tenderness.  Musculoskeletal: Normal range of motion.  Neurological: She is alert and oriented to person, place, and time.  Skin: Skin is warm and dry.  Psychiatric: She has a normal mood and affect. Her behavior is normal. Judgment and thought content normal.  Nursing note and vitals reviewed.   Mallampati Score:  MD Evaluation Airway: WNL Heart: WNL Abdomen: WNL Chest/ Lungs: WNL ASA  Classification: 3 Mallampati/Airway Score: Two  Imaging: No results found.  Labs:  CBC:  Recent Labs  05/22/15 0842 09/06/15 0947 12/20/15 0808  WBC 7.0 6.9 6.7  HGB  --   --  12.9  HCT 43.1 39.8 38.2  PLT 382* 357 331    COAGS:  Recent Labs  12/20/15 0808  INR 1.17  APTT 32    BMP:  Recent Labs  05/02/15 0755 05/22/15 0842 09/06/15 0947 12/20/15 0808  NA  --  138 134 135  K  --  3.8 3.7 3.6  CL  --  94* 94* 96*  CO2  --  27 28 26   GLUCOSE  --  137* 128* 141*  BUN  --  7* 8 7  CALCIUM  --  10.0 9.9 9.9  CREATININE 0.87 0.77 0.66 0.71  GFRNONAA >60 77 89 >60  GFRAA >60 89 102 >60    LIVER FUNCTION TESTS:  Recent Labs  05/22/15 0842 09/06/15 0947  BILITOT 0.4 0.5  AST 37 62*  ALT 40* 74*  ALKPHOS 84 98  PROT 7.0 7.3  ALBUMIN 4.4 4.5    TUMOR  MARKERS: No results for input(s): AFPTM, CEA, CA199, CHROMGRNA in the last 8760 hours.  Assessment and Plan:  Previously coiled anterior communicating artery aneurysm 2013 Followed with Dr Estanislado Pandy Now for recheck cerebral arteriogram Risks and Benefits discussed with the patient including, but not limited to bleeding, infection, vascular injury, contrast induced renal failure, stroke or even death. All of the patient's questions were answered, patient is agreeable to proceed. Consent signed and in chart.  Thank you for this interesting consult.  I greatly enjoyed meeting Elizabeth [name] and look forward to participating in their care.  A copy of this report was sent to the requesting provider on this date.  Electronically Signed: TURPIN,PAMELA A 12/20/2015, 9:20 AM   I spent a total of  30 Minutes   in face to face in clinical consultation, greater than 50% of which was counseling/coordinating care for cerebral arteriogram

## 2015-12-31 ENCOUNTER — Other Ambulatory Visit: Payer: Self-pay | Admitting: Family Medicine

## 2015-12-31 ENCOUNTER — Other Ambulatory Visit: Payer: Self-pay | Admitting: Physician Assistant

## 2015-12-31 NOTE — Telephone Encounter (Signed)
Last filled 12/04/15, last seen 09/06/15. Call in at [place]

## 2016-01-01 ENCOUNTER — Telehealth (HOSPITAL_COMMUNITY): Payer: Self-pay

## 2016-01-01 NOTE — Telephone Encounter (Signed)
Pt agreed to f/u in 2 years with a MRI/MRA of the head with Dr. Estanislado Pandy. AW

## 2016-01-08 ENCOUNTER — Encounter (INDEPENDENT_AMBULATORY_CARE_PROVIDER_SITE_OTHER): Payer: Self-pay

## 2016-01-08 ENCOUNTER — Encounter: Payer: Self-pay | Admitting: Family Medicine

## 2016-01-08 ENCOUNTER — Ambulatory Visit (INDEPENDENT_AMBULATORY_CARE_PROVIDER_SITE_OTHER): Payer: PPO | Admitting: Family Medicine

## 2016-01-08 VITALS — BP 132/77 | HR 80 | Temp 96.2°F | Ht 62.0 in | Wt 195.2 lb

## 2016-01-08 DIAGNOSIS — R6883 Chills (without fever): Secondary | ICD-10-CM | POA: Diagnosis not present

## 2016-01-08 DIAGNOSIS — R509 Fever, unspecified: Secondary | ICD-10-CM | POA: Diagnosis not present

## 2016-01-08 DIAGNOSIS — R52 Pain, unspecified: Secondary | ICD-10-CM

## 2016-01-08 DIAGNOSIS — J209 Acute bronchitis, unspecified: Secondary | ICD-10-CM | POA: Diagnosis not present

## 2016-01-08 LAB — VERITOR FLU A/B WAIVED
Influenza A: NEGATIVE
Influenza B: NEGATIVE

## 2016-01-08 MED ORDER — ALBUTEROL SULFATE HFA 108 (90 BASE) MCG/ACT IN AERS: 2.0000 | INHALATION_SPRAY | Freq: Four times a day (QID) | RESPIRATORY_TRACT | Status: DC | PRN

## 2016-01-08 MED ORDER — AZITHROMYCIN 250 MG PO TABS: ORAL_TABLET | ORAL | Status: DC

## 2016-01-08 NOTE — Progress Notes (Signed)
   HPI  Patient presents today here with acute illness.  Patient explains that for the last 1 day she's had body aches, chills, cough, and headache. She had a temperature measured 100.0 yesterday. She's tolerating fluids like Normal, she has decreased appetite.  Sugars are well controlled, fasting blood sugar this morning was 1:15.  She's breathing overall well, however she has increased productive cough compared to usual.  She requests albuterol inhaler She's previously tried [employer] but did not stick with it she has a history of asthma  PMH: Smoking status noted ROS: Per HPI  Objective: BP 132/77 mmHg  Pulse 80  Temp(Src) 96.2 F (35.7 C) (Oral)  Ht 5\' 2"  (1.575 m)  Wt 195 lb 3.2 oz (88.542 kg)  BMI 35.69 kg/m2 Gen: NAD, alert, cooperative with exam HEENT: NCAT, nares with swollen left sided turbinate, TMs normal bilaterally but partially obscured by cerumen CV: RRR, good S1/S2, no murmur Resp: CTABL, no wheezes, non-labored Ext: No edema, warm Neuro: Alert and oriented, No gross deficits  Assessment and plan:  # Acute bronchitis Given diabetes and history of asthma in treating a little bit more aggressively, azithromycin No prednisone without wheezing Also sent albuterol Return to clinic with any worsening symptoms or failure to improve as expected.    Orders Placed This Encounter  Procedures  . Veritor Flu A/B Waived    Order Specific Question:  Source    Answer:  nose    Meds ordered this encounter  Medications  . azithromycin (ZITHROMAX) 250 MG tablet    Sig: Take 2 tablets on day 1 and 1 tablet daily after that    Dispense:  6 tablet    Refill:  0  . albuterol (PROVENTIL HFA;VENTOLIN HFA) 108 (90 Base) MCG/ACT inhaler    Sig: Inhale 2 puffs into the lungs every 6 (six) hours as needed for wheezing or shortness of breath.    Dispense:  1 Inhaler    Refill:  0    Laroy Apple, MD [facility] 01/08/2016, 10:00 AM

## 2016-01-08 NOTE — Patient Instructions (Signed)
Great to see you!  Continue using the nasal saline Consider using zyrtec daily (not zyrtec D, just plain zyrtec)  Acute Bronchitis Bronchitis is when the airways that extend from the windpipe into the lungs get red, puffy, and painful (inflamed). Bronchitis often causes thick spit (mucus) to develop. This leads to a cough. A cough is the most common symptom of bronchitis. In acute bronchitis, the condition usually begins suddenly and goes away over time (usually in 2 weeks). Smoking, allergies, and asthma can make bronchitis worse. Repeated episodes of bronchitis may cause more lung problems. HOME CARE  Rest.  Drink enough fluids to keep your pee (urine) clear or pale yellow (unless you need to limit fluids as told by your doctor).  Only take over-the-counter or prescription medicines as told by your doctor.  Avoid smoking and secondhand smoke. These can make bronchitis worse. If you are a smoker, think about using nicotine gum or skin patches. Quitting smoking will help your lungs heal faster.  Reduce the chance of getting bronchitis again by:  Washing your hands often.  Avoiding people with cold symptoms.  Trying not to touch your hands to your mouth, nose, or eyes.  Follow up with your doctor as told. GET HELP IF: Your symptoms do not improve after 1 week of treatment. Symptoms include:  Cough.  Fever.  Coughing up thick spit.  Body aches.  Chest congestion.  Chills.  Shortness of breath.  Sore throat. GET HELP RIGHT AWAY IF:   You have an increased fever.  You have chills.  You have severe shortness of breath.  You have bloody thick spit (sputum).  You throw up (vomit) often.  You lose too much body fluid (dehydration).  You have a severe headache.  You faint. MAKE SURE YOU:   Understand these instructions.  Will watch your condition.  Will get help right away if you are not doing well or get worse.   This information is not intended to  replace advice given to you by your health care provider. Make sure you discuss any questions you have with your health care provider.   Document Released: 02/11/2008 Document Revised: 04/27/2013 Document Reviewed: 02/15/2013 Elsevier Interactive Patient Education [employer].

## 2016-01-29 ENCOUNTER — Other Ambulatory Visit: Payer: Self-pay | Admitting: Family Medicine

## 2016-01-30 ENCOUNTER — Ambulatory Visit (INDEPENDENT_AMBULATORY_CARE_PROVIDER_SITE_OTHER): Payer: PPO | Admitting: Family Medicine

## 2016-01-30 ENCOUNTER — Encounter: Payer: Self-pay | Admitting: Family Medicine

## 2016-01-30 VITALS — BP 141/76 | HR 91 | Temp 97.5°F | Ht 62.0 in | Wt 195.0 lb

## 2016-01-30 DIAGNOSIS — I69398 Other sequelae of cerebral infarction: Secondary | ICD-10-CM

## 2016-01-30 DIAGNOSIS — I10 Essential (primary) hypertension: Secondary | ICD-10-CM

## 2016-01-30 DIAGNOSIS — I2510 Atherosclerotic heart disease of native coronary artery without angina pectoris: Secondary | ICD-10-CM | POA: Diagnosis not present

## 2016-01-30 DIAGNOSIS — R945 Abnormal results of liver function studies: Secondary | ICD-10-CM

## 2016-01-30 DIAGNOSIS — I69998 Other sequelae following unspecified cerebrovascular disease: Secondary | ICD-10-CM | POA: Diagnosis not present

## 2016-01-30 DIAGNOSIS — K219 Gastro-esophageal reflux disease without esophagitis: Secondary | ICD-10-CM | POA: Diagnosis not present

## 2016-01-30 DIAGNOSIS — E1142 Type 2 diabetes mellitus with diabetic polyneuropathy: Secondary | ICD-10-CM | POA: Diagnosis not present

## 2016-01-30 DIAGNOSIS — E785 Hyperlipidemia, unspecified: Secondary | ICD-10-CM | POA: Diagnosis not present

## 2016-01-30 DIAGNOSIS — H539 Unspecified visual disturbance: Secondary | ICD-10-CM

## 2016-01-30 DIAGNOSIS — E1169 Type 2 diabetes mellitus with other specified complication: Secondary | ICD-10-CM

## 2016-01-30 DIAGNOSIS — T148 Other injury of unspecified body region: Secondary | ICD-10-CM | POA: Diagnosis not present

## 2016-01-30 DIAGNOSIS — E559 Vitamin D deficiency, unspecified: Secondary | ICD-10-CM

## 2016-01-30 DIAGNOSIS — J42 Unspecified chronic bronchitis: Secondary | ICD-10-CM | POA: Diagnosis not present

## 2016-01-30 DIAGNOSIS — I6359 Cerebral infarction due to unspecified occlusion or stenosis of other cerebral artery: Secondary | ICD-10-CM | POA: Diagnosis not present

## 2016-01-30 DIAGNOSIS — R7989 Other specified abnormal findings of blood chemistry: Secondary | ICD-10-CM

## 2016-01-30 DIAGNOSIS — W57XXXA Bitten or stung by nonvenomous insect and other nonvenomous arthropods, initial encounter: Secondary | ICD-10-CM

## 2016-01-30 DIAGNOSIS — E079 Disorder of thyroid, unspecified: Secondary | ICD-10-CM | POA: Diagnosis not present

## 2016-01-30 LAB — BAYER DCA HB A1C WAIVED: HB A1C (BAYER DCA - WAIVED): 6.5 % (ref <7.0)

## 2016-01-30 NOTE — Addendum Note (Signed)
Addended by: Zannie Cove on: 01/30/2016 10:30 AM   Modules accepted: Orders

## 2016-01-30 NOTE — Progress Notes (Signed)
Subjective:    Patient ID: Elizabeth [name], female    DOB: 1940-09-21, 75 y.o.   MRN: [id number]  HPI Pt here for follow up and management of chronic medical problems which includes diabetes and hyperlipidemia. She is taking medications regularly.The patient is doing well today with no specific complaints. She is due get a mammogram and a DEXA scan but wants to wait on this. She is due to get lab work and return an FOBT. The patient had cerebral arterial studies showing obliteration of the anterior communicating aneurysm with stable findings according to the interventional radiologist and April. These imaging results are in the record. The patient is in good spirits overall. She says the weather causes her to have more trouble with her breathing especially the dampness and coolness. She takes Chlor-Trimeton and Benadryl for her allergies and we discussed avoiding sedating antihistamines at this time. She is still smoking. She knows that she needs to stop. She denies chest pain or tightness. She says she is not having any trouble with swallowing her food heartburn indigestion nausea vomiting diarrhea or blood in the stool. She is passing her water without problems. She is pleased that her recent cerebral angiogram showed no further aneurysm issues. She will continue to follow-up with Dr. Neoma Laming sure.      Patient Active Problem List   Diagnosis Date Noted  . Type 2 diabetes mellitus with hyperlipidemia ([place]) 09/06/2015  . Type 2 diabetes mellitus with peripheral neuropathy ([place]) 11/27/2014  . CVA (cerebral vascular accident) ([place]) 02/27/2012  . Cerebral aneurysm 02/27/2012  . 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 Gait instability 02/27/2012  . Vision disturbance S/P CVA (cerebrovascular accident) 02/27/2012  . Dyspnea 11/14/2011  . Thyroid disease 10/12/2008  . Diabetes ([place]) 08/29/2008  . Hyperlipidemia 08/29/2008  . OBESITY, UNSPECIFIED 08/29/2008  . COPD 08/29/2008  . WEIGHT LOSS  08/29/2008  . DIARRHEA 08/29/2008  . HTN (hypertension) 08/28/2008  . ASTHMA 08/28/2008  . ESOPHAGEAL STRICTURE 08/28/2008  . GERD 08/28/2008  . HIATAL HERNIA 08/28/2008  . DIVERTICULOSIS, COLON 08/28/2008  . COLONIC POLYPS, HX OF 08/28/2008   Outpatient Encounter Prescriptions as of 01/30/2016  Medication Sig  . albuterol (PROVENTIL HFA;VENTOLIN HFA) 108 (90 Base) MCG/ACT inhaler Inhale 2 puffs into the lungs every 6 (six) hours as needed for wheezing or shortness of breath.  . ALPRAZolam (XANAX) 0.25 MG tablet TAKE 1 TABLET BY MOUTH TWICE DAILY AS NEEDED  . amLODipine (NORVASC) 10 MG tablet TAKE 1 TABLET BY MOUTH DAILY.  Marland Kitchen aspirin 325 MG EC tablet Take 325 mg by mouth daily.  . chlorpheniramine (CHLOR-TRIMETON) 4 MG tablet Take 2 mg by mouth daily as needed for allergies.  Marland Kitchen CINNAMON PO Take 2,000 mg by mouth daily.  . clopidogrel (PLAVIX) 75 MG tablet TAKE 1 TABLET BY MOUTH DAILY. (PLEASE FILL WITH AUROBINDO BRAND, PATIENT HAD ALLERGY TO DR REDDY)  . escitalopram (LEXAPRO) 10 MG tablet Take 1 tablet (10 mg total) by mouth daily.  Marland Kitchen glucose blood test strip Check BS BID and PRN. DX E11.9  . hydrochlorothiazide (MICROZIDE) 12.5 MG capsule TAKE 1 CAPSULE BY MOUTH EVERY DAY  . JANUVIA 50 MG tablet TAKE 1 TABLET BY MOUTH EVERY DAY  . metaxalone (SKELAXIN) 800 MG tablet Take 1 tablet (800 mg total) by mouth at bedtime. (Patient taking differently: Take 400 mg by mouth at bedtime. )  . metFORMIN (GLUCOPHAGE) 1000 MG tablet TAKE 1 TABLET BY MOUTH EVERY MORNING WITH BREAKFAST  . metoCLOPramide (REGLAN)  10 MG tablet TAKE ONE TABLET BY MOUTH 1 TIME DAILY AS NEEDED  . Omega-3 Fatty Acids (FISH OIL) 1000 MG CAPS Take 1 capsule by mouth daily.  . pantoprazole (PROTONIX) 40 MG tablet TAKE 1 TABLET BY MOUTH DAILY.  Marland Kitchen Vitamin D, Ergocalciferol, (DRISDOL) 50000 UNITS CAPS capsule Take 1 capsule (50,000 Units total) by mouth every 7 (seven) days.  . [DISCONTINUED] azithromycin (ZITHROMAX) 250 MG tablet  Take 2 tablets on day 1 and 1 tablet daily after that   No facility-administered encounter medications on [name] as of 01/30/2016.      Review of Systems  Constitutional: Negative.   HENT: Negative.   Eyes: Negative.   Respiratory: Negative.   Cardiovascular: Negative.   Gastrointestinal: Negative.   Endocrine: Negative.   Genitourinary: Negative.   Musculoskeletal: Negative.   Skin: Negative.   Allergic/Immunologic: Negative.   Neurological: Negative.   Hematological: Negative.   Psychiatric/Behavioral: Negative.        Objective:   Physical Exam  Constitutional: She is oriented to person, place, and time. She appears well-developed and well-nourished. No distress.  HENT:  Head: Normocephalic and atraumatic.  Right Ear: External ear normal.  Left Ear: External ear normal.  Mouth/Throat: Oropharynx is clear and moist. No oropharyngeal exudate.  Nasal congestion bilaterally  Eyes: Conjunctivae and EOM are normal. Pupils are equal, round, and reactive to light. Right eye exhibits no discharge. Left eye exhibits no discharge. No scleral icterus.  Neck: Normal range of motion. Neck supple. No thyromegaly present.  No bruits or thyromegaly  Cardiovascular: Normal rate, regular rhythm, normal heart sounds and intact distal pulses.   No murmur heard. The heart had a regular rate and rhythm at 72/m  Pulmonary/Chest: Effort normal and breath sounds normal. No respiratory distress. She has no wheezes. She has no rales. She exhibits no tenderness.  No wheezes rhonchi or chest tightness with coughing  Abdominal: Soft. Bowel sounds are normal. She exhibits no mass. There is no tenderness. There is no rebound and no guarding.  The abdomen is obese without masses or organ enlargement or bruits  Musculoskeletal: Normal range of motion. She exhibits no edema or tenderness.  Lymphadenopathy:    She has no cervical adenopathy.  Neurological: She is alert and oriented to person, place, and  time. She has normal reflexes. No cranial nerve deficit.  Skin: Skin is warm and dry. No rash noted.  Patient has dry skin.  Psychiatric: She has a normal mood and affect. Her behavior is normal. Judgment and thought content normal.  Nursing note and vitals reviewed.  BP 141/76 mmHg  Pulse 91  Temp(Src) 97.5 F (36.4 C) (Oral)  Ht _0  (1.575 m)  Wt 195 lb (88.451 kg)  BMI 35.66 kg/m2        Assessment & Plan:  1. Type 2 diabetes mellitus with peripheral neuropathy ([place]) -Patient will continue with aggressive therapeutic lifestyle changes which include diet and exercise. - Bayer DCA Hb A1c Waived - BMP8+EGFR - CBC with Differential/Platelet  2. Essential hypertension -The blood pressure is minimally elevated today and we'll make no changes in her medication. - BMP8+EGFR - CBC with Differential/Platelet - Hepatic function panel  3. Vitamin D deficiency -She will continue current treatment pending results of lab work - CBC with Differential/Platelet - VITAMIN D 25 Hydroxy (Vit-D Deficiency, Fractures)  4. Hyperlipidemia -She is statin intolerant and will continue with omega-3 fatty acids and aggressive therapeutic lifestyle changes - CBC with Differential/Platelet - Lipid panel  5.  Thyroid disease - CBC with Differential/Platelet  6. Gastroesophageal reflux disease, esophagitis presence not specified -Currently she is doing well with her reflux and only takes hand to resolve and Alka-Seltzer on an as needed basis. - CBC with Differential/Platelet  7. ASCVD (arteriosclerotic cardiovascular disease) -Follow up with cardiology as needed - CBC with Differential/Platelet  8. Type 2 diabetes mellitus with hyperlipidemia ([facility]) -Continue with current treatment and aggressive therapeutic lifestyle changes  9. Cerebrovascular accident (CVA) due to occlusion of other cerebral artery ([place]) -Follow-up with interventional radiology as needed  10. Vision disturbance  following cerebrovascular accident -The patient is aware that she needs an eye exam  11. Chronic bronchitis, unspecified chronic bronchitis type ([place]) -She needs to make every effort at stopping smoking  12. Tick bite -This was a regular dog tick or with taking we will do tick titers to confirm spotted fever  Patient Instructions                       Medicare Annual Wellness Visit  [facility] and the medical providers at [facility] strive to bring you the best medical care.  In doing so we not only want to address your current medical conditions and concerns but also to detect new conditions early and prevent illness, disease and health-related problems.    Medicare offers a yearly Wellness Visit which allows our clinical staff to assess your need for preventative services including immunizations, lifestyle education, counseling to decrease risk of preventable diseases and screening for fall risk and other medical concerns.    This visit is provided free of charge (no copay) for all Medicare recipients. The clinical pharmacists at [facility] have begun to conduct these Wellness Visits which will also include a thorough review of all your medications.    As you primary medical provider recommend that you make an appointment for your Annual Wellness Visit if you have not done so already this year.  You may set up this appointment before you leave today or you may call back ([phone]) and schedule an appointment.  Please make sure when you call that you mention that you are scheduling your Annual Wellness Visit with the clinical pharmacist so that the appointment may be made for the proper length of time.     Continue current medications. Continue good therapeutic lifestyle changes which include good diet and exercise. Fall precautions discussed with patient. If an FOBT was given today- please return it to our front desk. If you are over 31  years old - you may need Prevnar 74 or the adult Pneumonia vaccine.  **Flu shots are available--- please call and schedule a FLU-CLINIC appointment**  After your visit with [place] today you will receive a survey in the mail or online from [employer] regarding your care with [place]. Please take a moment to fill this out. Your feedback is very important to [place] as you can help [place] better understand your patient needs as well as improve your experience and satisfaction. WE CARE ABOUT YOU!!!   The patient is aware that she needs to schedule her mammogram her DEXA scan and an eye exam and she says that she will do this. She is also aware that she should try to stop smoking and make every effort to do this We will call with results of the lab work as soon as it becomes available She should check her blood sugars regularly She should use deep [place] off  especially when out in the yard She should decrease the use of sedating antihistamines because of the increased association with dementia   Arrie Senate MD

## 2016-01-30 NOTE — Patient Instructions (Addendum)
Medicare Annual Wellness Visit  [place] and the medical providers at [facility] strive to bring you the best medical care.  In doing so we not only want to address your current medical conditions and concerns but also to detect new conditions early and prevent illness, disease and health-related problems.    Medicare offers a yearly Wellness Visit which allows our clinical staff to assess your need for preventative services including immunizations, lifestyle education, counseling to decrease risk of preventable diseases and screening for fall risk and other medical concerns.    This visit is provided free of charge (no copay) for all Medicare recipients. The clinical pharmacists at [facility] have begun to conduct these Wellness Visits which will also include a thorough review of all your medications.    As you primary medical provider recommend that you make an appointment for your Annual Wellness Visit if you have not done so already this year.  You may set up this appointment before you leave today or you may call back [phone] and schedule an appointment.  Please make sure when you call that you mention that you are scheduling your Annual Wellness Visit with the clinical pharmacist so that the appointment may be made for the proper length of time.     Continue current medications. Continue good therapeutic lifestyle changes which include good diet and exercise. Fall precautions discussed with patient. If an FOBT was given today- please return it to our front desk. If you are over 68 years old - you may need Prevnar 62 or the adult Pneumonia vaccine.  **Flu shots are available--- please call and schedule a FLU-CLINIC appointment**  After your visit with [place] today you will receive a survey in the mail or online from [employer] regarding your care with [place]. Please take a moment to fill this out. Your feedback is very  important to [place] as you can help [place] better understand your patient needs as well as improve your experience and satisfaction. WE CARE ABOUT YOU!!!   The patient is aware that she needs to schedule her mammogram her DEXA scan and an eye exam and she says that she will do this. She is also aware that she should try to stop smoking and make every effort to do this We will call with results of the lab work as soon as it becomes available She should check her blood sugars regularly She should use deep [place] off especially when out in the yard She should decrease the use of sedating antihistamines because of the increased association with dementia

## 2016-01-31 NOTE — Addendum Note (Signed)
Addended by: Thana Ates on: 01/31/2016 10:42 AM   Modules accepted: Orders

## 2016-02-01 ENCOUNTER — Other Ambulatory Visit: Payer: Self-pay | Admitting: Family Medicine

## 2016-02-01 LAB — CBC WITH DIFFERENTIAL/PLATELET
Basophils Absolute: 0.1 10*3/uL (ref 0.0–0.2)
Basos: 1 %
EOS (ABSOLUTE): 0.2 10*3/uL (ref 0.0–0.4)
Eos: 2 %
Hematocrit: 42.4 % (ref 34.0–46.6)
Hemoglobin: 13.6 g/dL (ref 11.1–15.9)
Immature Grans (Abs): 0 10*3/uL (ref 0.0–0.1)
Immature Granulocytes: 0 %
Lymphocytes Absolute: 2.2 10*3/uL (ref 0.7–3.1)
Lymphs: 28 %
MCH: 26.7 pg (ref 26.6–33.0)
MCHC: 32.1 g/dL (ref 31.5–35.7)
MCV: 83 fL (ref 79–97)
Monocytes Absolute: 0.5 10*3/uL (ref 0.1–0.9)
Monocytes: 6 %
Neutrophils Absolute: 4.9 10*3/uL (ref 1.4–7.0)
Neutrophils: 63 %
Platelets: 371 10*3/uL (ref 150–379)
RBC: 5.1 x10E6/uL (ref 3.77–5.28)
RDW: 14.8 % (ref 12.3–15.4)
WBC: 7.8 10*3/uL (ref 3.4–10.8)

## 2016-02-01 LAB — HEPATIC FUNCTION PANEL
ALT: 92 IU/L — ABNORMAL HIGH (ref 0–32)
AST: 85 IU/L — ABNORMAL HIGH (ref 0–40)
Albumin: 4.6 g/dL (ref 3.5–4.8)
Alkaline Phosphatase: 99 IU/L (ref 39–117)
Bilirubin Total: 0.6 mg/dL (ref 0.0–1.2)
Bilirubin, Direct: 0.19 mg/dL (ref 0.00–0.40)
Total Protein: 7.3 g/dL (ref 6.0–8.5)

## 2016-02-01 LAB — LIPID PANEL
Chol/HDL Ratio: 7 ratio units — ABNORMAL HIGH (ref 0.0–4.4)
Cholesterol, Total: 299 mg/dL — ABNORMAL HIGH (ref 100–199)
HDL: 43 mg/dL (ref >39)
LDL Calculated: 222 mg/dL — ABNORMAL HIGH (ref 0–99)
Triglycerides: 168 mg/dL — ABNORMAL HIGH (ref 0–149)
VLDL Cholesterol Cal: 34 mg/dL (ref 5–40)

## 2016-02-01 LAB — BMP8+EGFR
BUN/Creatinine Ratio: 11 — ABNORMAL LOW (ref 12–28)
BUN: 8 mg/dL (ref 8–27)
CO2: 26 mmol/L (ref 18–29)
Calcium: 10.5 mg/dL — ABNORMAL HIGH (ref 8.7–10.3)
Chloride: 91 mmol/L — ABNORMAL LOW (ref 96–106)
Creatinine, Ser: 0.73 mg/dL (ref 0.57–1.00)
GFR calc Af Amer: 94 mL/min/{1.73_m2} (ref >59)
GFR calc non Af Amer: 82 mL/min/{1.73_m2} (ref >59)
Glucose: 112 mg/dL — ABNORMAL HIGH (ref 65–99)
Potassium: 3.7 mmol/L (ref 3.5–5.2)
Sodium: 136 mmol/L (ref 134–144)

## 2016-02-01 LAB — VITAMIN D 25 HYDROXY (VIT D DEFICIENCY, FRACTURES): Vit D, 25-Hydroxy: 26.4 ng/mL — ABNORMAL LOW (ref 30.0–100.0)

## 2016-02-01 LAB — ROCKY MTN SPOTTED FVR ABS PNL(IGG+IGM)
RMSF IgG: NEGATIVE
RMSF IgM: 0.2 index (ref 0.00–0.89)

## 2016-02-01 LAB — LYME AB/WESTERN BLOT REFLEX
LYME DISEASE AB, QUANT, IGM: <0.8 index (ref 0.00–0.79)
Lyme IgG/IgM Ab: <0.91 {ISR} (ref 0.00–0.90)

## 2016-02-12 ENCOUNTER — Ambulatory Visit (HOSPITAL_COMMUNITY): Admission: RE | Admit: 2016-02-12 | Payer: PPO | Source: Ambulatory Visit

## 2016-02-14 ENCOUNTER — Telehealth: Payer: Self-pay | Admitting: Family Medicine

## 2016-02-18 NOTE — Telephone Encounter (Signed)
This call is regarding two of her family members  - this encounter to be closed

## 2016-02-28 ENCOUNTER — Other Ambulatory Visit: Payer: Self-pay | Admitting: Family Medicine

## 2016-03-24 ENCOUNTER — Telehealth: Payer: Self-pay | Admitting: Family Medicine

## 2016-03-24 NOTE — Telephone Encounter (Signed)
[email or web address]

## 2016-03-31 ENCOUNTER — Other Ambulatory Visit: Payer: Self-pay | Admitting: Family Medicine

## 2016-04-01 ENCOUNTER — Other Ambulatory Visit: Payer: Self-pay | Admitting: Family Medicine

## 2016-04-01 DIAGNOSIS — Z1231 Encounter for screening mammogram for malignant neoplasm of breast: Secondary | ICD-10-CM

## 2016-04-02 NOTE — Telephone Encounter (Signed)
Documented

## 2016-04-07 ENCOUNTER — Ambulatory Visit (HOSPITAL_COMMUNITY)
Admission: RE | Admit: 2016-04-07 | Discharge: 2016-04-07 | Disposition: A | Discharge status: Home / Self Care | Payer: PPO | Source: Ambulatory Visit | Attending: Family Medicine | Admitting: Family Medicine

## 2016-04-07 DIAGNOSIS — Z1231 Encounter for screening mammogram for malignant neoplasm of breast: Secondary | ICD-10-CM | POA: Diagnosis not present

## 2016-04-08 ENCOUNTER — Other Ambulatory Visit: Payer: Self-pay | Admitting: Family Medicine

## 2016-04-28 ENCOUNTER — Other Ambulatory Visit: Payer: Self-pay | Admitting: Family Medicine

## 2016-05-28 ENCOUNTER — Other Ambulatory Visit: Payer: Self-pay | Admitting: Family Medicine

## 2016-06-07 ENCOUNTER — Other Ambulatory Visit: Payer: Self-pay | Admitting: Family Medicine

## 2016-06-11 ENCOUNTER — Telehealth: Payer: Self-pay | Admitting: Family Medicine

## 2016-06-11 MED ORDER — SITAGLIPTIN PHOSPHATE 50 MG PO TABS: 50.0000 mg | ORAL_TABLET | Freq: Every day | ORAL | 0 refills | Status: DC

## 2016-06-11 NOTE — Telephone Encounter (Signed)
Patient aware.

## 2016-06-11 NOTE — Telephone Encounter (Signed)
No samples available currently.  Rx with coupon for #30 days for free left at front desk

## 2016-06-18 ENCOUNTER — Ambulatory Visit (INDEPENDENT_AMBULATORY_CARE_PROVIDER_SITE_OTHER): Payer: PPO | Admitting: Family Medicine

## 2016-06-18 ENCOUNTER — Encounter: Payer: Self-pay | Admitting: Family Medicine

## 2016-06-18 VITALS — BP 127/66 | HR 67 | Temp 98.1°F | Ht 62.0 in | Wt 197.0 lb

## 2016-06-18 DIAGNOSIS — I2510 Atherosclerotic heart disease of native coronary artery without angina pectoris: Secondary | ICD-10-CM | POA: Diagnosis not present

## 2016-06-18 DIAGNOSIS — Z1211 Encounter for screening for malignant neoplasm of colon: Secondary | ICD-10-CM

## 2016-06-18 DIAGNOSIS — I10 Essential (primary) hypertension: Secondary | ICD-10-CM

## 2016-06-18 DIAGNOSIS — J209 Acute bronchitis, unspecified: Secondary | ICD-10-CM | POA: Diagnosis not present

## 2016-06-18 DIAGNOSIS — E559 Vitamin D deficiency, unspecified: Secondary | ICD-10-CM

## 2016-06-18 DIAGNOSIS — E1142 Type 2 diabetes mellitus with diabetic polyneuropathy: Secondary | ICD-10-CM

## 2016-06-18 DIAGNOSIS — E079 Disorder of thyroid, unspecified: Secondary | ICD-10-CM | POA: Diagnosis not present

## 2016-06-18 DIAGNOSIS — E7849 Other hyperlipidemia: Secondary | ICD-10-CM

## 2016-06-18 DIAGNOSIS — E784 Other hyperlipidemia: Secondary | ICD-10-CM | POA: Diagnosis not present

## 2016-06-18 DIAGNOSIS — K219 Gastro-esophageal reflux disease without esophagitis: Secondary | ICD-10-CM

## 2016-06-18 LAB — BAYER DCA HB A1C WAIVED: HB A1C (BAYER DCA - WAIVED): 7.4 % — ABNORMAL HIGH (ref <7.0)

## 2016-06-18 MED ORDER — AZITHROMYCIN 250 MG PO TABS: ORAL_TABLET | ORAL | 0 refills | Status: DC

## 2016-06-18 MED ORDER — TRAMADOL HCL 50 MG PO TABS: 25.0000 mg | ORAL_TABLET | Freq: Every day | ORAL | 0 refills | Status: DC | PRN

## 2016-06-18 NOTE — Addendum Note (Signed)
Addended by: Zannie Cove on: 06/18/2016 11:31 AM   Modules accepted: Orders

## 2016-06-18 NOTE — Progress Notes (Signed)
Subjective:    Patient ID: Elizabeth [name], female    DOB: 1940-09-29, 75 y.o.   MRN: [id number]  HPI Pt here for follow up and management of chronic medical problems which includes diabetes, hyperlipidemia, and hypertension. She is taking medications regularly.The patient continues to have some cough and congestion and also has headaches and wants some tramadol to keep on hand at home for the headaches. We are refilling her Januvia. She brought her FOBT in today and will get lab work today. The patient has headaches secondary to her allergies and muscle contraction and tension. She takes tramadol and/or Skelaxin if needed for this. She denies any chest pain and she is very active. There is no increased shortness of breath and no PND. She does have occasional heartburn and takes over-the-counter medicine for this. She has not seen any blood in the stool or had any black tarry bowel movements and has not seen any change in her bowel habits. Her sister died of pancreatic cancer. She denies any trouble with passing her water other than occasional urgency. Her fatigue symptoms are stable. Her blood sugars at home fasting have been running around 90s up her 90s and during the day they may be up into the 150 range. She is due to get an eye exam she says the first of the year she has had a cataract on the right. 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 She does also today complaining of cough and congestion and yellow sputum.     Patient Active Problem List   Diagnosis Date Noted  . Type 2 diabetes mellitus with hyperlipidemia ([place]) 09/06/2015  . Type 2 diabetes mellitus with peripheral neuropathy ([place]) 11/27/2014  . CVA (cerebral vascular accident) ([place]) 02/27/2012  . Cerebral aneurysm  02/27/2012  . 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 Gait instability 02/27/2012  . Vision disturbance following cerebrovascular accident 02/27/2012  . Dyspnea 11/14/2011  . Thyroid disease 10/12/2008  . Diabetes ([place]) 08/29/2008  . Hyperlipidemia 08/29/2008  . OBESITY, UNSPECIFIED 08/29/2008  . COPD (chronic obstructive pulmonary disease) ([place]) 08/29/2008  . WEIGHT LOSS 08/29/2008  . DIARRHEA 08/29/2008  . HTN (hypertension) 08/28/2008  . ASTHMA 08/28/2008  . ESOPHAGEAL STRICTURE 08/28/2008  . GERD 08/28/2008  . HIATAL HERNIA 08/28/2008  . DIVERTICULOSIS, COLON 08/28/2008  . COLONIC POLYPS, HX OF 08/28/2008   Outpatient Encounter Prescriptions as of 06/18/2016  Medication Sig  . ALPRAZolam (XANAX) 0.25 MG tablet TAKE 1 TABLET BY MOUTH TWICE DAILY AS NEEDED  . amLODipine (NORVASC) 10 MG tablet TAKE 1 TABLET BY MOUTH DAILY.  Marland Kitchen aspirin 325 MG EC tablet Take 325 mg by mouth daily.  . chlorpheniramine (CHLOR-TRIMETON) 4 MG tablet Take 2 mg by mouth daily as needed for allergies.  Marland Kitchen CINNAMON PO Take 2,000 mg by mouth daily.  . clopidogrel (PLAVIX) 75 MG tablet TAKE 1 TABLET BY MOUTH DAILY. (PLEASE FILL WITH AUROBINDO BRAND, PATIENT HAD ALLERGY TO DR REDDY)  . escitalopram (LEXAPRO) 10 MG tablet TAKE 1 TABLET BY MOUTH EVERY DAY  . glucose blood test strip Check BS BID and PRN. DX E11.9  . hydrochlorothiazide (MICROZIDE) 12.5 MG capsule TAKE 1 CAPSULE BY MOUTH EVERY DAY  . metaxalone (SKELAXIN) 800 MG tablet Take 1 tablet (  800 mg total) by mouth at bedtime. (Patient taking differently: Take 400 mg by mouth at bedtime. )  . metFORMIN (GLUCOPHAGE) 1000 MG tablet TAKE 1 TABLET BY MOUTH EVERY MORNING WITH BREAKFAST.  Marland Kitchen metoCLOPramide (REGLAN) 10 MG tablet TAKE ONE TABLET BY MOUTH 1 TIME DAILY AS NEEDED  . Omega-3 Fatty Acids (FISH OIL) 1000 MG CAPS Take 1 capsule by mouth daily.  . pantoprazole (PROTONIX) 40 MG tablet TAKE 1 TABLET BY MOUTH DAILY.  . sitaGLIPtin (JANUVIA) 50 MG tablet Take  1 tablet (50 mg total) by mouth daily.  . VENTOLIN HFA 108 (90 Base) MCG/ACT inhaler INHALE 2 PUFFS INTO THE LUNGS EVERY 6 (SIX) HOURS AS NEEDED FOR WHEEZING OR SHORTNESS OF BREATH.  Marland Kitchen Vitamin D, Ergocalciferol, (DRISDOL) 50000 units CAPS capsule TAKE 1 CAPSULE BY MOUTH EVERY 7 DAYS.   No facility-administered encounter medications on [name] as of 06/18/2016.       Review of Systems  Constitutional: Negative.   HENT: Positive for congestion and sinus pressure.   Eyes: Negative.   Respiratory: Positive for cough.   Cardiovascular: Negative.   Gastrointestinal: Negative.   Endocrine: Negative.   Genitourinary: Negative.   Musculoskeletal: Negative.   Skin: Negative.   Allergic/Immunologic: Negative.   Neurological: Positive for headaches.  Hematological: Negative.   Psychiatric/Behavioral: Negative.        Objective:   Physical Exam  Constitutional: She is oriented to person, place, and time. She appears well-developed and well-nourished.  HENT:  Head: Normocephalic and atraumatic.  Right Ear: External ear normal.  Left Ear: External ear normal.  Mouth/Throat: Oropharynx is clear and moist. No oropharyngeal exudate.  Nasal congestion bilaterally  Eyes: Conjunctivae and EOM are normal. Pupils are equal, round, and reactive to light. Right eye exhibits no discharge. Left eye exhibits no discharge. No scleral icterus.  Neck: Normal range of motion. Neck supple. No thyromegaly present.  No anterior cervical adenopathy  Cardiovascular: Normal rate, regular rhythm, normal heart sounds and intact distal pulses.   No murmur heard. Pulmonary/Chest: Effort normal. No respiratory distress. She has wheezes. She has no rales.  Bronchial congestion and mild wheezes on inspiration  Abdominal: Soft. Bowel sounds are normal. She exhibits no mass. There is no tenderness. There is no rebound and no guarding.  Abdominal obesity without masses tenderness or organ enlargement  Musculoskeletal:  Normal range of motion. She exhibits no edema.  Lymphadenopathy:    She has no cervical adenopathy.  Neurological: She is alert and oriented to person, place, and time. She has normal reflexes. No cranial nerve deficit.  Skin: Skin is warm and dry. No rash noted.  Psychiatric: She has a normal mood and affect. Her behavior is normal. Judgment and thought content normal.  Nursing note and vitals reviewed.  BP 127/66 (BP Location: Right Arm)   Pulse 67   Temp 98.1 F (36.7 C) (Oral)   Ht _0  (1.575 m)   Wt 197 lb (89.4 kg)   BMI 36.03 kg/m         Assessment & Plan:  1. Type 2 diabetes mellitus with peripheral neuropathy (HCC) -Continue to monitor blood sugars closely and watch diet and take current treatment - Bayer DCA Hb A1c Waived - CBC with Differential/Platelet  2. Essential hypertension -Watch sodium intake monitor blood pressures closely - BMP8+EGFR - CBC with Differential/Platelet - Hepatic function panel  3. Vitamin D deficiency -Continue current dose of vitamin D pending results of lab work - CBC with Differential/Platelet - VITAMIN D  25 Hydroxy (Vit-D Deficiency, Fractures)  4. Other hyperlipidemia -The patient is statin intolerant and must continue with aggressive therapeutic lifestyle changes in controlling her cholesterol - CBC with Differential/Platelet - Lipid panel  5. Gastroesophageal reflux disease, esophagitis presence not specified -Watch diet closely and continue with avoiding acid rich foods - CBC with Differential/Platelet - Hepatic function panel  6. Thyroid disease -The patient has had some fatigue recently so we will check a thyroid tests for this. - CBC with Differential/Platelet  7. ASCVD (arteriosclerotic cardiovascular disease) -Follow-up with cardiology as needed - CBC with Differential/Platelet  8. Bronchitis with bronchospasm -Take Zithromax and take Mucinex and drink plenty of fluids  Meds ordered this encounter    Medications  . traMADol (ULTRAM) 50 MG tablet    Sig: Take 0.5-1 tablets (25-50 mg total) by mouth daily as needed.    Dispense:  30 tablet    Refill:  0  . azithromycin (ZITHROMAX) 250 MG tablet    Sig: As directed    Dispense:  6 tablet    Refill:  0   Patient Instructions                       Medicare Annual Wellness Visit  [facility] and the medical providers at [facility] strive to bring you the best medical care.  In doing so we not only want to address your current medical conditions and concerns but also to detect new conditions early and prevent illness, disease and health-related problems.    Medicare offers a yearly Wellness Visit which allows our clinical staff to assess your need for preventative services including immunizations, lifestyle education, counseling to decrease risk of preventable diseases and screening for fall risk and other medical concerns.    This visit is provided free of charge (no copay) for all Medicare recipients. The clinical pharmacists at [facility] have begun to conduct these Wellness Visits which will also include a thorough review of all your medications.    As you primary medical provider recommend that you make an appointment for your Annual Wellness Visit if you have not done so already this year.  You may set up this appointment before you leave today or you may call back [phone]) and schedule an appointment.  Please make sure when you call that you mention that you are scheduling your Annual Wellness Visit with the clinical pharmacist so that the appointment may be made for the proper length of time.     Continue current medications. Continue good therapeutic lifestyle changes which include good diet and exercise. Fall precautions discussed with patient. If an FOBT was given today- please return it to our front desk. If you are over 52 years old - you may need Prevnar 51 or the adult  Pneumonia vaccine.  **Flu shots are available--- please call and schedule a FLU-CLINIC appointment**  After your visit with [place] today you will receive a survey in the mail or online from [employer] regarding your care with [place]. Please take a moment to fill this out. Your feedback is very important to [place] as you can help [place] better understand your patient needs as well as improve your experience and satisfaction. WE CARE ABOUT YOU!!!   The patient should continue to watch her diet closely She should take the antibiotic until it is completed She should drink plenty of fluids and take Mucinex plain for cough and congestion    Arrie Senate  MD   

## 2016-06-18 NOTE — Patient Instructions (Addendum)
Medicare Annual Wellness Visit  [facility] and the medical providers at [facility] strive to bring you the best medical care.  In doing so we not only want to address your current medical conditions and concerns but also to detect new conditions early and prevent illness, disease and health-related problems.    Medicare offers a yearly Wellness Visit which allows our clinical staff to assess your need for preventative services including immunizations, lifestyle education, counseling to decrease risk of preventable diseases and screening for fall risk and other medical concerns.    This visit is provided free of charge (no copay) for all Medicare recipients. The clinical pharmacists at [facility] have begun to conduct these Wellness Visits which will also include a thorough review of all your medications.    As you primary medical provider recommend that you make an appointment for your Annual Wellness Visit if you have not done so already this year.  You may set up this appointment before you leave today or you may call back [phone] and schedule an appointment.  Please make sure when you call that you mention that you are scheduling your Annual Wellness Visit with the clinical pharmacist so that the appointment may be made for the proper length of time.     Continue current medications. Continue good therapeutic lifestyle changes which include good diet and exercise. Fall precautions discussed with patient. If an FOBT was given today- please return it to our front desk. If you are over 51 years old - you may need Prevnar 46 or the adult Pneumonia vaccine.  **Flu shots are available--- please call and schedule a FLU-CLINIC appointment**  After your visit with [place] today you will receive a survey in the mail or online from [employer] regarding your care with [place]. Please take a moment to fill this out. Your feedback is very  important to [place] as you can help [place] better understand your patient needs as well as improve your experience and satisfaction. WE CARE ABOUT YOU!!!   The patient should continue to watch her diet closely She should take the antibiotic until it is completed She should drink plenty of fluids and take Mucinex plain for cough and congestion

## 2016-06-19 ENCOUNTER — Telehealth: Payer: Self-pay | Admitting: Family Medicine

## 2016-06-19 LAB — LIPID PANEL
Chol/HDL Ratio: 7.1 ratio units — ABNORMAL HIGH (ref 0.0–4.4)
Cholesterol, Total: 276 mg/dL — ABNORMAL HIGH (ref 100–199)
HDL: 39 mg/dL — ABNORMAL LOW (ref >39)
LDL Calculated: 208 mg/dL — ABNORMAL HIGH (ref 0–99)
Triglycerides: 145 mg/dL (ref 0–149)
VLDL Cholesterol Cal: 29 mg/dL (ref 5–40)

## 2016-06-19 LAB — BMP8+EGFR
BUN/Creatinine Ratio: 11 — ABNORMAL LOW (ref 12–28)
BUN: 8 mg/dL (ref 8–27)
CO2: 28 mmol/L (ref 18–29)
Calcium: 10.2 mg/dL (ref 8.7–10.3)
Chloride: 93 mmol/L — ABNORMAL LOW (ref 96–106)
Creatinine, Ser: 0.72 mg/dL (ref 0.57–1.00)
GFR calc Af Amer: 96 mL/min/{1.73_m2} (ref >59)
GFR calc non Af Amer: 83 mL/min/{1.73_m2} (ref >59)
Glucose: 144 mg/dL — ABNORMAL HIGH (ref 65–99)
Potassium: 4 mmol/L (ref 3.5–5.2)
Sodium: 137 mmol/L (ref 134–144)

## 2016-06-19 LAB — CBC WITH DIFFERENTIAL/PLATELET
Basophils Absolute: 0 10*3/uL (ref 0.0–0.2)
Basos: 0 %
EOS (ABSOLUTE): 0.2 10*3/uL (ref 0.0–0.4)
Eos: 2 %
Hematocrit: 39.4 % (ref 34.0–46.6)
Hemoglobin: 13.5 g/dL (ref 11.1–15.9)
Immature Grans (Abs): 0 10*3/uL (ref 0.0–0.1)
Immature Granulocytes: 0 %
Lymphocytes Absolute: 1.9 10*3/uL (ref 0.7–3.1)
Lymphs: 19 %
MCH: 28.1 pg (ref 26.6–33.0)
MCHC: 34.3 g/dL (ref 31.5–35.7)
MCV: 82 fL (ref 79–97)
Monocytes Absolute: 0.6 10*3/uL (ref 0.1–0.9)
Monocytes: 6 %
Neutrophils Absolute: 7.3 10*3/uL — ABNORMAL HIGH (ref 1.4–7.0)
Neutrophils: 73 %
Platelets: 301 10*3/uL (ref 150–379)
RBC: 4.81 x10E6/uL (ref 3.77–5.28)
RDW: 14 % (ref 12.3–15.4)
WBC: 10 10*3/uL (ref 3.4–10.8)

## 2016-06-19 LAB — HEPATIC FUNCTION PANEL
ALT: 111 IU/L — ABNORMAL HIGH (ref 0–32)
AST: 99 IU/L — ABNORMAL HIGH (ref 0–40)
Albumin: 4.4 g/dL (ref 3.5–4.8)
Alkaline Phosphatase: 125 IU/L — ABNORMAL HIGH (ref 39–117)
Bilirubin Total: 0.6 mg/dL (ref 0.0–1.2)
Bilirubin, Direct: 0.22 mg/dL (ref 0.00–0.40)
Total Protein: 7 g/dL (ref 6.0–8.5)

## 2016-06-19 LAB — VITAMIN D 25 HYDROXY (VIT D DEFICIENCY, FRACTURES): Vit D, 25-Hydroxy: 29.6 ng/mL — ABNORMAL LOW (ref 30.0–100.0)

## 2016-06-19 LAB — FECAL OCCULT BLOOD, IMMUNOCHEMICAL: Fecal Occult Bld: POSITIVE — AB

## 2016-06-19 NOTE — Telephone Encounter (Signed)
Please advise on script for prednisone.

## 2016-06-19 NOTE — Telephone Encounter (Signed)
Prednisone 10 two day taper

## 2016-06-20 NOTE — Telephone Encounter (Signed)
PT AWARE TODAY BY kaY

## 2016-06-27 ENCOUNTER — Other Ambulatory Visit: Payer: Self-pay | Admitting: Family Medicine

## 2016-07-01 ENCOUNTER — Other Ambulatory Visit: Payer: Self-pay | Admitting: Family Medicine

## 2016-07-02 ENCOUNTER — Other Ambulatory Visit: Payer: Self-pay | Admitting: Family Medicine

## 2016-07-09 ENCOUNTER — Other Ambulatory Visit: Payer: Self-pay | Admitting: Family Medicine

## 2016-07-09 ENCOUNTER — Other Ambulatory Visit (INDEPENDENT_AMBULATORY_CARE_PROVIDER_SITE_OTHER): Payer: PPO

## 2016-07-09 ENCOUNTER — Ambulatory Visit (INDEPENDENT_AMBULATORY_CARE_PROVIDER_SITE_OTHER): Payer: PPO

## 2016-07-09 DIAGNOSIS — R059 Cough, unspecified: Secondary | ICD-10-CM

## 2016-07-09 DIAGNOSIS — R05 Cough: Secondary | ICD-10-CM | POA: Diagnosis not present

## 2016-07-09 DIAGNOSIS — R710 Precipitous drop in hematocrit: Secondary | ICD-10-CM

## 2016-07-09 LAB — CBC WITH DIFFERENTIAL/PLATELET
Basophils Absolute: 0.1 10*3/uL (ref 0.0–0.2)
Basos: 1 %
EOS (ABSOLUTE): 0.2 10*3/uL (ref 0.0–0.4)
Eos: 2 %
Hematocrit: 41.6 % (ref 34.0–46.6)
Hemoglobin: 14 g/dL (ref 11.1–15.9)
Immature Grans (Abs): 0 10*3/uL (ref 0.0–0.1)
Immature Granulocytes: 0 %
Lymphocytes Absolute: 1.8 10*3/uL (ref 0.7–3.1)
Lymphs: 23 %
MCH: 28 pg (ref 26.6–33.0)
MCHC: 33.7 g/dL (ref 31.5–35.7)
MCV: 83 fL (ref 79–97)
Monocytes Absolute: 0.4 10*3/uL (ref 0.1–0.9)
Monocytes: 5 %
Neutrophils Absolute: 5.6 10*3/uL (ref 1.4–7.0)
Neutrophils: 69 %
Platelets: 328 10*3/uL (ref 150–379)
RBC: 5 x10E6/uL (ref 3.77–5.28)
RDW: 14.1 % (ref 12.3–15.4)
WBC: 8 10*3/uL (ref 3.4–10.8)

## 2016-08-19 ENCOUNTER — Encounter: Payer: Self-pay | Admitting: Family Medicine

## 2016-08-19 ENCOUNTER — Ambulatory Visit (INDEPENDENT_AMBULATORY_CARE_PROVIDER_SITE_OTHER): Payer: PPO | Admitting: Family Medicine

## 2016-08-19 VITALS — BP 127/74 | HR 83 | Temp 99.1°F | Ht 62.0 in | Wt 197.1 lb

## 2016-08-19 DIAGNOSIS — H1033 Unspecified acute conjunctivitis, bilateral: Secondary | ICD-10-CM

## 2016-08-19 MED ORDER — AZITHROMYCIN 1 % OP SOLN: OPHTHALMIC | 0 refills | Status: DC

## 2016-08-19 NOTE — Progress Notes (Signed)
BP 127/74   Pulse 83   Temp 99.1 F (37.3 C) (Oral)   Ht 5\' 2"  (1.575 m)   Wt 197 lb 2 oz (89.4 kg)   BMI 36.05 kg/m    Subjective:    Patient ID: Elizabeth [name], female    DOB: 1941-04-11, 75 y.o.   MRN: [id number]  HPI: Elizabeth [name] is a 75 y.o. female presenting on 08/19/2016 for Eyes draining and matted (x 3 days)   HPI Eye drainage Patient has been having bilateral eye drainage and matting in thickness and irritation is been going on for the past 3 days. She denies any fevers or chills. She does have some cough and sneezing and congestion which is similar to her allergies and he has been treating that but that has been going on for 3 or 4 weeks. She has been using her albuterol inhaler. She denies any shortness of breath or wheezing. She denies any sick contacts that she knows of. She denies any visual deficits. Her eyes are very pruritic and irritated.  Relevant past medical, surgical, family and social history reviewed and updated as indicated. Interim medical history since our last visit reviewed. Allergies and medications reviewed and updated.  Review of Systems  Constitutional: Negative for chills and fever.  HENT: Positive for congestion and sneezing. Negative for ear discharge, ear pain, postnasal drip, rhinorrhea, sinus pressure and sore throat.   Eyes: Positive for discharge, redness and itching. Negative for pain and visual disturbance.  Respiratory: Positive for cough. Negative for chest tightness, shortness of breath and wheezing.   Cardiovascular: Negative for chest pain and leg swelling.  Genitourinary: Negative for difficulty urinating and dysuria.  Musculoskeletal: Negative for back pain and gait problem.  Skin: Negative for rash.  Neurological: Negative for light-headedness and headaches.  Psychiatric/Behavioral: Negative for agitation and behavioral problems.  All other systems reviewed and are negative.   Per HPI unless specifically indicated above       Objective:    BP 127/74   Pulse 83   Temp 99.1 F (37.3 C) (Oral)   Ht 5\' 2"  (1.575 m)   Wt 197 lb 2 oz (89.4 kg)   BMI 36.05 kg/m   Wt Readings from Last 3 Encounters:  08/19/16 197 lb 2 oz (89.4 kg)  06/18/16 197 lb (89.4 kg)  01/30/16 195 lb (88.5 kg)    Physical Exam  Constitutional: She is oriented to person, place, and time. She appears well-developed and well-nourished. No distress.  HENT:  Right Ear: Tympanic membrane, external ear and ear canal normal.  Left Ear: Tympanic membrane, external ear and ear canal normal.  Nose: No mucosal edema or rhinorrhea. No epistaxis. Right sinus exhibits no maxillary sinus tenderness and no frontal sinus tenderness. Left sinus exhibits no maxillary sinus tenderness and no frontal sinus tenderness.  Mouth/Throat: Uvula is midline and mucous membranes are normal. No oropharyngeal exudate, posterior oropharyngeal edema, posterior oropharyngeal erythema or tonsillar abscesses.  Eyes: EOM are normal. Pupils are equal, round, and reactive to light. Right eye exhibits discharge. Left eye exhibits discharge. Right conjunctiva is injected. Right conjunctiva has no hemorrhage. Left conjunctiva is injected. Left conjunctiva has no hemorrhage.  Cardiovascular: Normal rate, regular rhythm, normal heart sounds and intact distal pulses.   No murmur heard. Pulmonary/Chest: Effort normal and breath sounds normal. No respiratory distress. She has no wheezes.  Musculoskeletal: Normal range of motion. She exhibits no edema or tenderness.  Neurological: She is alert and oriented to person,  place, and time. Coordination normal.  Skin: Skin is warm and dry. No rash noted. She is not diaphoretic.  Psychiatric: She has a normal mood and affect. Her behavior is normal.  Vitals reviewed.      Assessment & Plan:   Problem List Items Addressed This Visit    None    Visit Diagnoses    Acute conjunctivitis of both eyes, unspecified acute conjunctivitis type    -   Primary   Will do drops, return if does not improve or worsens   Relevant Medications   azithromycin (AZASITE) 1 % ophthalmic solution       Follow up plan: Return if symptoms worsen or fail to improve.  Counseling provided for all of the vaccine components No orders of the defined types were placed in this encounter.   Caryl Pina, MD [facility] 08/19/2016, 10:27 AM

## 2016-08-21 ENCOUNTER — Other Ambulatory Visit: Payer: Self-pay | Admitting: *Deleted

## 2016-08-21 MED ORDER — ALPRAZOLAM 0.25 MG PO TABS: 0.2500 mg | ORAL_TABLET | Freq: Two times a day (BID) | ORAL | 1 refills | Status: DC | PRN

## 2016-08-21 NOTE — Telephone Encounter (Signed)
Last filled 05/11/2016. If approved please route to pool for nurse to call into [facility].

## 2016-08-25 ENCOUNTER — Ambulatory Visit (INDEPENDENT_AMBULATORY_CARE_PROVIDER_SITE_OTHER): Payer: PPO | Admitting: Family Medicine

## 2016-08-25 ENCOUNTER — Encounter: Payer: Self-pay | Admitting: Family Medicine

## 2016-08-25 VITALS — BP 130/66 | HR 82 | Temp 98.3°F | Ht 62.0 in | Wt 195.0 lb

## 2016-08-25 DIAGNOSIS — J042 Acute laryngotracheitis: Secondary | ICD-10-CM

## 2016-08-25 DIAGNOSIS — R52 Pain, unspecified: Secondary | ICD-10-CM

## 2016-08-25 LAB — RAPID STREP SCREEN (MED CTR MEBANE ONLY): Strep Gp A Ag, IA W/Reflex: NEGATIVE

## 2016-08-25 LAB — VERITOR FLU A/B WAIVED
Influenza A: NEGATIVE
Influenza B: NEGATIVE

## 2016-08-25 LAB — CULTURE, GROUP A STREP

## 2016-08-25 MED ORDER — PREDNISONE 10 MG PO TABS: ORAL_TABLET | ORAL | 0 refills | Status: DC

## 2016-08-25 MED ORDER — METHYLPREDNISOLONE ACETATE 80 MG/ML IJ SUSP
80.0000 mg | Freq: Once | INTRAMUSCULAR | Status: AC
  Administered 2016-08-25: 80 mg via INTRAMUSCULAR

## 2016-08-25 NOTE — Progress Notes (Signed)
Subjective:    Patient ID: Elizabeth [name], female    DOB: 27-Sep-1940, 75 y.o.   MRN: [id number]  HPI Patient here today for general body aches, ear fullness and throat tightness that started 2 days .  The patient denies any fever. She did have some cough but this is better. Her eyes are better following the interim eyes and eyedrops. She denies any nausea or vomiting or trouble passing her water. She does have a lot of allergies and I suspect this is associated with her allergies syndrome issues. She does not have  electric heat.    Patient Active Problem List   Diagnosis Date Noted  . Type 2 diabetes mellitus with hyperlipidemia ([place]) 09/06/2015  . Type 2 diabetes mellitus with peripheral neuropathy ([place]) 11/27/2014  . CVA (cerebral vascular accident) ([place]) 02/27/2012  . Cerebral aneurysm 02/27/2012  . 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 Gait instability 02/27/2012  . Vision disturbance following cerebrovascular accident 02/27/2012  . Dyspnea 11/14/2011  . Thyroid disease 10/12/2008  . Diabetes ([place]) 08/29/2008  . Hyperlipidemia 08/29/2008  . OBESITY, UNSPECIFIED 08/29/2008  . COPD (chronic obstructive pulmonary disease) ([place]) 08/29/2008  . WEIGHT LOSS 08/29/2008  . DIARRHEA 08/29/2008  . HTN (hypertension) 08/28/2008  . ASTHMA 08/28/2008  . ESOPHAGEAL STRICTURE 08/28/2008  . GERD 08/28/2008  . HIATAL HERNIA 08/28/2008  . DIVERTICULOSIS, COLON 08/28/2008  . COLONIC POLYPS, HX OF 08/28/2008   Outpatient Encounter Prescriptions as of 08/25/2016  Medication Sig  . ALPRAZolam (XANAX) 0.25 MG tablet Take 1 tablet (0.25 mg total) by mouth 2 (two) times daily as needed.  Marland Kitchen amLODipine (NORVASC) 10 MG tablet TAKE 1 TABLET BY MOUTH EVERY DAY  . aspirin 325 MG EC tablet Take 325 mg by mouth daily.  Marland Kitchen azelastine (ASTELIN) 0.1 % nasal spray USE 1 SPRAY IN EACH NOSTRIL TWICE DAILY AS DIRECTED  . chlorpheniramine (CHLOR-TRIMETON) 4 MG tablet Take 2 mg by mouth daily as needed for  allergies.  Marland Kitchen CINNAMON PO Take 2,000 mg by mouth daily.  . clopidogrel (PLAVIX) 75 MG tablet TAKE 1 TABLET BY MOUTH DAILY. (PLEASE FILL WITH AUROBINDO BRAND, PATIENT HAD ALLERGY TO DR REDDY)  . escitalopram (LEXAPRO) 10 MG tablet TAKE 1 TABLET BY MOUTH EVERY DAY  . glucose blood test strip Check BS BID and PRN. DX E11.9  . hydrochlorothiazide (MICROZIDE) 12.5 MG capsule TAKE 1 CAPSULE BY MOUTH EVERY DAY  . JANUVIA 50 MG tablet TAKE 1 TABLET BY MOUTH DAILY  . metaxalone (SKELAXIN) 800 MG tablet Take 1 tablet (800 mg total) by mouth at bedtime. (Patient taking differently: Take 400 mg by mouth at bedtime. )  . metFORMIN (GLUCOPHAGE) 1000 MG tablet TAKE 1 TABLET BY MOUTH EVERY MORNING WITH BREAKFAST.  Marland Kitchen metoCLOPramide (REGLAN) 10 MG tablet TAKE ONE TABLET BY MOUTH 1 TIME DAILY AS NEEDED  . Omega-3 Fatty Acids (FISH OIL) 1000 MG CAPS Take 1 capsule by mouth daily.  . pantoprazole (PROTONIX) 40 MG tablet TAKE 1 TABLET BY MOUTH DAILY.  . traMADol (ULTRAM) 50 MG tablet Take 0.5-1 tablets (25-50 mg total) by mouth daily as needed.  . VENTOLIN HFA 108 (90 Base) MCG/ACT inhaler INHALE 2 PUFFS INTO THE LUNGS EVERY 6 (SIX) HOURS AS NEEDED FOR WHEEZING OR SHORTNESS OF BREATH.  Marland Kitchen Vitamin D, Ergocalciferol, (DRISDOL) 50000 units CAPS capsule TAKE 1 CAPSULE BY MOUTH EVERY 7 DAYS.  . [DISCONTINUED] azithromycin (AZASITE) 1 % ophthalmic solution Due to drops on first day and 1 drop every day  for the next 4 days in both eyes   No facility-administered encounter medications on [name] as of 08/25/2016.      Review of Systems  Constitutional: Positive for fatigue. Negative for fever.  HENT: Positive for ear pain and sore throat (tight throat ).   Eyes: Negative.   Respiratory: Negative.   Cardiovascular: Negative.   Gastrointestinal: Negative.   Endocrine: Negative.   Genitourinary: Negative.   Musculoskeletal: Positive for myalgias (all over ).  Skin: Negative.   Allergic/Immunologic: Negative.     Neurological: Negative.   Hematological: Negative.   Psychiatric/Behavioral: Negative.        Objective:   Physical Exam  Constitutional: She is oriented to person, place, and time. She appears well-developed and well-nourished. No distress.  HENT:  Head: Normocephalic and atraumatic.  Right Ear: External ear normal.  Left Ear: External ear normal.  Mouth/Throat: Oropharynx is clear and moist. No oropharyngeal exudate.  Slight nasal congestion  Eyes: Conjunctivae and EOM are normal. Pupils are equal, round, and reactive to light. Right eye exhibits no discharge. Left eye exhibits no discharge. No scleral icterus.  Neck: Normal range of motion. Neck supple. No thyromegaly present.  Cardiovascular: Normal rate, regular rhythm and normal heart sounds.  Exam reveals no friction rub.   No murmur heard. Pulmonary/Chest: Effort normal and breath sounds normal. No respiratory distress. She has no wheezes. She has no rales.  Dry Cough  Abdominal: Soft. Bowel sounds are normal. She exhibits no mass. There is no tenderness. There is no rebound and no guarding.  Musculoskeletal: Normal range of motion. She exhibits no edema.  Lymphadenopathy:    She has no cervical adenopathy.  Neurological: She is alert and oriented to person, place, and time.  Skin: Skin is warm and dry. No rash noted.  Psychiatric: She has a normal mood and affect. Her behavior is normal. Judgment and thought content normal.  Nursing note and vitals reviewed.  BP 130/66 (BP Location: Left Arm)   Pulse 82   Temp 98.3 F (36.8 C) (Oral)   Ht 5\' 2"  (1.575 m)   Wt 195 lb (88.5 kg)   BMI 35.67 kg/m         Assessment & Plan:  1. Body aches - CBC with Differential/Platelet - Sedimentation rate -BMP thyroid and rapid strep  2. Acute laryngotracheitis -Drink plenty of fluids and stay well hydrated and take medication as directed   60 of Depo-Medrol IM with a written prescription for prednisone 10 Taper #20 that  she will wait a couple of days to start on if she is not better.   Patient Instructions  We will give you an injection of Depo-Medrol 60 mg today We'll give you a prescription for prednisone 10 Taper #20 and if you're not feeling any better in a couple days she will take this We will call you with your lab work results as soon as they become available Drink plenty of fluids and stay well hydrated Take Tylenol for aches pains and fever  Arrie Senate MD

## 2016-08-25 NOTE — Patient Instructions (Addendum)
We will give you an injection of Depo-Medrol 60 mg today We'll give you a prescription for prednisone 10 Taper #20 and if you're not feeling any better in a couple days she will take this We will call you with your lab work results as soon as they become available Drink plenty of fluids and stay well hydrated Take Tylenol for aches pains and fever

## 2016-08-25 NOTE — Addendum Note (Signed)
Addended by: Zannie Cove on: 08/25/2016 02:20 PM   Modules accepted: Orders

## 2016-08-26 LAB — CBC WITH DIFFERENTIAL/PLATELET
Basophils Absolute: 0.1 10*3/uL (ref 0.0–0.2)
Basos: 1 %
EOS (ABSOLUTE): 0.2 10*3/uL (ref 0.0–0.4)
Eos: 2 %
Hematocrit: 40.7 % (ref 34.0–46.6)
Hemoglobin: 13.8 g/dL (ref 11.1–15.9)
Immature Grans (Abs): 0 10*3/uL (ref 0.0–0.1)
Immature Granulocytes: 0 %
Lymphocytes Absolute: 2.4 10*3/uL (ref 0.7–3.1)
Lymphs: 30 %
MCH: 27.8 pg (ref 26.6–33.0)
MCHC: 33.9 g/dL (ref 31.5–35.7)
MCV: 82 fL (ref 79–97)
Monocytes Absolute: 0.4 10*3/uL (ref 0.1–0.9)
Monocytes: 5 %
Neutrophils Absolute: 4.9 10*3/uL (ref 1.4–7.0)
Neutrophils: 62 %
Platelets: 336 10*3/uL (ref 150–379)
RBC: 4.96 x10E6/uL (ref 3.77–5.28)
RDW: 14.8 % (ref 12.3–15.4)
WBC: 7.9 10*3/uL (ref 3.4–10.8)

## 2016-08-26 LAB — THYROID PANEL WITH TSH
Free Thyroxine Index: 1.8 (ref 1.2–4.9)
T3 Uptake Ratio: 20 % — ABNORMAL LOW (ref 24–39)
T4, Total: 9 ug/dL (ref 4.5–12.0)
TSH: 1.68 u[IU]/mL (ref 0.450–4.500)

## 2016-08-26 LAB — BMP8+EGFR
BUN/Creatinine Ratio: 14 (ref 12–28)
BUN: 10 mg/dL (ref 8–27)
CO2: 29 mmol/L (ref 18–29)
Calcium: 9.9 mg/dL (ref 8.7–10.3)
Chloride: 91 mmol/L — ABNORMAL LOW (ref 96–106)
Creatinine, Ser: 0.72 mg/dL (ref 0.57–1.00)
GFR calc Af Amer: 96 mL/min/{1.73_m2} (ref >59)
GFR calc non Af Amer: 83 mL/min/{1.73_m2} (ref >59)
Glucose: 148 mg/dL — ABNORMAL HIGH (ref 65–99)
Potassium: 4.2 mmol/L (ref 3.5–5.2)
Sodium: 135 mmol/L (ref 134–144)

## 2016-08-26 LAB — SEDIMENTATION RATE: Sed Rate: 3 mm/hr (ref 0–40)

## 2016-09-09 ENCOUNTER — Encounter: Payer: Self-pay | Admitting: *Deleted

## 2016-09-25 ENCOUNTER — Other Ambulatory Visit: Payer: Self-pay | Admitting: Family Medicine

## 2016-09-29 ENCOUNTER — Other Ambulatory Visit: Payer: Self-pay | Admitting: Family Medicine

## 2016-09-29 DIAGNOSIS — H1033 Unspecified acute conjunctivitis, bilateral: Secondary | ICD-10-CM

## 2016-10-08 ENCOUNTER — Other Ambulatory Visit: Payer: Self-pay | Admitting: Family Medicine

## 2016-10-17 ENCOUNTER — Encounter: Payer: Self-pay | Admitting: Pediatrics

## 2016-10-17 ENCOUNTER — Ambulatory Visit (INDEPENDENT_AMBULATORY_CARE_PROVIDER_SITE_OTHER): Payer: PPO | Admitting: Pediatrics

## 2016-10-17 VITALS — BP 112/64 | HR 78 | Temp 97.6°F | Resp 22 | Ht 62.0 in | Wt 198.2 lb

## 2016-10-17 DIAGNOSIS — R6889 Other general symptoms and signs: Secondary | ICD-10-CM

## 2016-10-17 DIAGNOSIS — J101 Influenza due to other identified influenza virus with other respiratory manifestations: Secondary | ICD-10-CM | POA: Diagnosis not present

## 2016-10-17 LAB — VERITOR FLU A/B WAIVED
Influenza A: POSITIVE — AB
Influenza B: NEGATIVE

## 2016-10-17 MED ORDER — OSELTAMIVIR PHOSPHATE 75 MG PO CAPS: 75.0000 mg | ORAL_CAPSULE | Freq: Two times a day (BID) | ORAL | 0 refills | Status: AC

## 2016-10-17 NOTE — Patient Instructions (Signed)
Use albuterol three times a day

## 2016-10-17 NOTE — Progress Notes (Signed)
  Subjective:   Patient ID: Elizabeth [name], female    DOB: 1941/02/16, 76 y.o.   MRN: [id number] CC: Cough; Nasal Congestion; Fever; and Fatigue  HPI: Elizabeth [name] is a 76 y.o. female presenting for Cough; Nasal Congestion; Fever; and Fatigue  Started getting sick yesterday. Felt fine going to bed 2 days ago then woke up coughing a lot that night Yesterday felt sleepy, coughing a lot Temp of 101.8 yesterday Has bene on tamiflu in the past, didn't have s/e or reactions Used albuterol this morning   Relevant past medical, surgical, family and social history reviewed. Allergies and medications reviewed and updated. History  Smoking Status  . Former Smoker  . Packs/day: 1.00  . Years: 20.00  . Types: Cigarettes  . Start date: 01/15/1983  . Quit date: 02/27/2012  Smokeless Tobacco  . Never Used   ROS: Per HPI   Objective:    BP 112/64   Pulse 78   Temp 97.6 F (36.4 C) (Oral)   Resp (!) 22   Ht 5\' 2"  (1.575 m)   Wt 198 lb 3.2 oz (89.9 kg)   SpO2 96%   BMI 36.25 kg/m   Wt Readings from Last 3 Encounters:  10/17/16 198 lb 3.2 oz (89.9 kg)  08/25/16 195 lb (88.5 kg)  08/19/16 197 lb 2 oz (89.4 kg)    Gen: NAD, alert, cooperative with exam, NCAT EYES: EOMI, no conjunctival injection, or no icterus ENT:  TMs pearly gray b/l, OP without erythema LYMPH: no cervical LAD CV: NRRR, normal S1/S2, no murmur, distal pulses 2+ b/l Resp: CTABL, a few scattered wheezes with exhalation, normal WOB Abd: +BS, soft, NTND.  Ext: No edema, warm Neuro: Alert and oriented MSK: normal muscle bulk  Assessment & Plan:  Elizabeth [name] was seen today for cough, nasal congestion, fever and fatigue.  Diagnoses and all orders for this visit:  Influenza A Discussed symptomatic care, reutrn precautions Use albuterol three times a day while sick Start below -     oseltamivir (TAMIFLU) 75 MG capsule; Take 1 capsule (75 mg total) by mouth 2 (two) times daily.  Flu-like symptoms -     Veritor Flu A/B  Waived  Follow up plan: prn Assunta Found, MD [place]

## 2016-10-21 ENCOUNTER — Telehealth: Payer: Self-pay | Admitting: Pediatrics

## 2016-10-22 NOTE — Telephone Encounter (Signed)
Wants to know what else she can do for the thick phlegm?  She has coughing spells when she eats.  Advised her to drink extra fluids, use vaporizer , try mucinex and don't smoke.  She says mucinex has too many side effects and warnings listed so she is scared to use it.  She does not own a humidifier but will consider purchasing one.  She claims she is not smoking due to feeling bad. Please give advice on what else can be done.

## 2016-10-22 NOTE — Telephone Encounter (Signed)
Tried to reach pt, no answer, no VM. Humidifed air, with humidifier or just standing in a bathroom with hot water on in the shower without a fan on can get her breathing humidified air. If it is mucus/congestion from her sinuses she can use sinus rinses/neti pot. If she is feeling worse or thinks she is coughing more or having any trouble breathing we should see her back in clinic, may need chest XR.

## 2016-10-24 NOTE — Telephone Encounter (Signed)
Returned patient's phone call.  Patient aware of Dr. Autumn Patty recommendations.  Patient states that she is feeling better and will call if she gets worse.

## 2016-10-25 ENCOUNTER — Other Ambulatory Visit: Payer: Self-pay | Admitting: Family Medicine

## 2016-10-27 ENCOUNTER — Other Ambulatory Visit: Payer: Self-pay | Admitting: Family Medicine

## 2016-11-14 ENCOUNTER — Telehealth: Payer: Self-pay | Admitting: *Deleted

## 2016-11-14 MED ORDER — MONTELUKAST SODIUM 10 MG PO TABS: 10.0000 mg | ORAL_TABLET | Freq: Every day | ORAL | 4 refills | Status: DC

## 2016-11-14 NOTE — Telephone Encounter (Signed)
Please okay the Singulair prescription 10 mg 1 daily with one years refill

## 2016-11-14 NOTE — Telephone Encounter (Signed)
Patient aware, script is ready. 

## 2016-11-14 NOTE — Telephone Encounter (Signed)
Patient wants a script of singulair sent to [facility].  Her allergies are bothering her.  Fullness in ears and head with sneezing.  Please advise.

## 2016-11-24 ENCOUNTER — Other Ambulatory Visit: Payer: Self-pay | Admitting: Family Medicine

## 2016-12-10 ENCOUNTER — Ambulatory Visit: Payer: PPO | Admitting: Family Medicine

## 2016-12-11 ENCOUNTER — Encounter: Payer: Self-pay | Admitting: Family Medicine

## 2016-12-19 ENCOUNTER — Telehealth: Payer: Self-pay | Admitting: Family Medicine

## 2016-12-22 NOTE — Telephone Encounter (Signed)
Noted - appt was rescheduled

## 2016-12-30 ENCOUNTER — Other Ambulatory Visit: Payer: Self-pay | Admitting: Family Medicine

## 2016-12-31 NOTE — Telephone Encounter (Signed)
Please call in alprazolam with 1 refillsl

## 2017-01-12 ENCOUNTER — Other Ambulatory Visit: Payer: Self-pay | Admitting: *Deleted

## 2017-01-12 DIAGNOSIS — I10 Essential (primary) hypertension: Secondary | ICD-10-CM

## 2017-01-12 DIAGNOSIS — E118 Type 2 diabetes mellitus with unspecified complications: Secondary | ICD-10-CM

## 2017-01-12 DIAGNOSIS — E7800 Pure hypercholesterolemia, unspecified: Secondary | ICD-10-CM

## 2017-01-12 DIAGNOSIS — E559 Vitamin D deficiency, unspecified: Secondary | ICD-10-CM

## 2017-01-14 ENCOUNTER — Other Ambulatory Visit: Payer: Self-pay | Admitting: Pediatrics

## 2017-01-19 ENCOUNTER — Other Ambulatory Visit: Payer: Self-pay | Admitting: Family Medicine

## 2017-01-21 ENCOUNTER — Other Ambulatory Visit (HOSPITAL_COMMUNITY)
Admission: RE | Admit: 2017-01-21 | Discharge: 2017-01-21 | Disposition: A | Discharge status: Home / Self Care | Payer: PPO | Source: Ambulatory Visit | Attending: Family Medicine | Admitting: Family Medicine

## 2017-01-21 ENCOUNTER — Other Ambulatory Visit: Payer: Self-pay | Admitting: *Deleted

## 2017-01-21 DIAGNOSIS — E118 Type 2 diabetes mellitus with unspecified complications: Secondary | ICD-10-CM | POA: Diagnosis not present

## 2017-01-21 LAB — LIPID PANEL
Cholesterol: 258 mg/dL — ABNORMAL HIGH (ref 0–200)
HDL: 37 mg/dL — ABNORMAL LOW (ref >40)
LDL Cholesterol: 194 mg/dL — ABNORMAL HIGH (ref 0–99)
Total CHOL/HDL Ratio: 7 RATIO
Triglycerides: 136 mg/dL (ref <150)
VLDL: 27 mg/dL (ref 0–40)

## 2017-01-21 LAB — COMPREHENSIVE METABOLIC PANEL
ALT: 86 U/L — ABNORMAL HIGH (ref 14–54)
AST: 88 U/L — ABNORMAL HIGH (ref 15–41)
Albumin: 4 g/dL (ref 3.5–5.0)
Alkaline Phosphatase: 116 U/L (ref 38–126)
Anion gap: 10 (ref 5–15)
BUN: 12 mg/dL (ref 6–20)
CO2: 28 mmol/L (ref 22–32)
Calcium: 9.8 mg/dL (ref 8.9–10.3)
Chloride: 96 mmol/L — ABNORMAL LOW (ref 101–111)
Creatinine, Ser: 0.72 mg/dL (ref 0.44–1.00)
GFR calc Af Amer: >60 mL/min (ref >60)
GFR calc non Af Amer: >60 mL/min (ref >60)
Glucose, Bld: 188 mg/dL — ABNORMAL HIGH (ref 65–99)
Potassium: 3.6 mmol/L (ref 3.5–5.1)
Sodium: 134 mmol/L — ABNORMAL LOW (ref 135–145)
Total Bilirubin: 0.9 mg/dL (ref 0.3–1.2)
Total Protein: 7.5 g/dL (ref 6.5–8.1)

## 2017-01-21 LAB — CBC WITH DIFFERENTIAL/PLATELET
Basophils Absolute: 0 10*3/uL (ref 0.0–0.1)
Basophils Relative: 1 %
Eosinophils Absolute: 0.2 10*3/uL (ref 0.0–0.7)
Eosinophils Relative: 3 %
HCT: 39.3 % (ref 36.0–46.0)
Hemoglobin: 13.1 g/dL (ref 12.0–15.0)
Lymphocytes Relative: 26 %
Lymphs Abs: 1.7 10*3/uL (ref 0.7–4.0)
MCH: 27.8 pg (ref 26.0–34.0)
MCHC: 33.3 g/dL (ref 30.0–36.0)
MCV: 83.4 fL (ref 78.0–100.0)
Monocytes Absolute: 0.5 10*3/uL (ref 0.1–1.0)
Monocytes Relative: 8 %
Neutro Abs: 4 10*3/uL (ref 1.7–7.7)
Neutrophils Relative %: 62 %
Platelets: 290 10*3/uL (ref 150–400)
RBC: 4.71 MIL/uL (ref 3.87–5.11)
RDW: 14.1 % (ref 11.5–15.5)
WBC: 6.4 10*3/uL (ref 4.0–10.5)

## 2017-01-22 ENCOUNTER — Encounter: Payer: Self-pay | Admitting: Family Medicine

## 2017-01-22 ENCOUNTER — Ambulatory Visit (INDEPENDENT_AMBULATORY_CARE_PROVIDER_SITE_OTHER): Payer: PPO | Admitting: Family Medicine

## 2017-01-22 VITALS — BP 133/82 | HR 78 | Temp 96.6°F | Ht 62.0 in | Wt 195.0 lb

## 2017-01-22 DIAGNOSIS — I2510 Atherosclerotic heart disease of native coronary artery without angina pectoris: Secondary | ICD-10-CM

## 2017-01-22 DIAGNOSIS — E079 Disorder of thyroid, unspecified: Secondary | ICD-10-CM | POA: Diagnosis not present

## 2017-01-22 DIAGNOSIS — I10 Essential (primary) hypertension: Secondary | ICD-10-CM

## 2017-01-22 DIAGNOSIS — E559 Vitamin D deficiency, unspecified: Secondary | ICD-10-CM

## 2017-01-22 DIAGNOSIS — E784 Other hyperlipidemia: Secondary | ICD-10-CM | POA: Diagnosis not present

## 2017-01-22 DIAGNOSIS — K219 Gastro-esophageal reflux disease without esophagitis: Secondary | ICD-10-CM

## 2017-01-22 DIAGNOSIS — E1142 Type 2 diabetes mellitus with diabetic polyneuropathy: Secondary | ICD-10-CM | POA: Diagnosis not present

## 2017-01-22 DIAGNOSIS — E7849 Other hyperlipidemia: Secondary | ICD-10-CM

## 2017-01-22 LAB — VITAMIN D 25 HYDROXY (VIT D DEFICIENCY, FRACTURES): Vit D, 25-Hydroxy: 28.4 ng/mL — ABNORMAL LOW (ref 30.0–100.0)

## 2017-01-22 LAB — HEMOGLOBIN A1C
Hgb A1c MFr Bld: 7.5 % — ABNORMAL HIGH (ref 4.8–5.6)
Mean Plasma Glucose: 169 mg/dL

## 2017-01-22 NOTE — Patient Instructions (Addendum)
Medicare Annual Wellness Visit  [facility] and the medical providers at [facility] strive to bring you the best medical care.  In doing so we not only want to address your current medical conditions and concerns but also to detect new conditions early and prevent illness, disease and health-related problems.    Medicare offers a yearly Wellness Visit which allows our clinical staff to assess your need for preventative services including immunizations, lifestyle education, counseling to decrease risk of preventable diseases and screening for fall risk and other medical concerns.    This visit is provided free of charge (no copay) for all Medicare recipients. The clinical pharmacists at [facility] have begun to conduct these Wellness Visits which will also include a thorough review of all your medications.    As you primary medical provider recommend that you make an appointment for your Annual Wellness Visit if you have not done so already this year.  You may set up this appointment before you leave today or you may call back ([phone]) and schedule an appointment.  Please make sure when you call that you mention that you are scheduling your Annual Wellness Visit with the clinical pharmacist so that the appointment may be made for the proper length of time.     Continue current medications. Continue good therapeutic lifestyle changes which include good diet and exercise. Fall precautions discussed with patient. If an FOBT was given today- please return it to our front desk. If you are over 48 years old - you may need Prevnar 43 or the adult Pneumonia vaccine.  **Flu shots are available--- please call and schedule a FLU-CLINIC appointment**  After your visit with [place] today you will receive a survey in the mail or online from [employer] regarding your care with [place]. Please take a moment to fill this out. Your feedback is very  important to [place] as you can help [place] better understand your patient needs as well as improve your experience and satisfaction. WE CARE ABOUT YOU!!!  Increase metformin take 1000 MG morning and 500 in the evening Continue Januvia as doing Add Livalo 2 mg one half tablet twice weekly and take samples and call us when you run out so we can continue this medicine if it does not cause any side effects Please do not forget to get your eye exam and make sure we get a copy of that report Please return the FOBT if given one today.

## 2017-01-22 NOTE — Progress Notes (Signed)
Subjective:    Patient ID: Elizabeth [name], female    DOB: 05-17-1941, 76 y.o.   MRN: [id number]  HPI Pt here for follow up and management of chronic medical problems which includes diabetes, hyperlipidemia, and hypertension. She is taking medication regularly.The patient is doing well overall and as usual has no complaints and is requiring no refills. She has had blood work done recently and this will be reviewed with her during the visit today. Her electrolytes had a blood sugar that was elevated at 188. The sodium was slightly decreased and the chloride was slightly decreased. The creatinine however was good at 0.72. 2 liver function tests were elevated all other ones were good. A1c was elevated at 7.5% this time and previously had been in the prediabetic range. The CBC had a normal white blood cell count with a hemoglobin is good at 13.1 and the platelet count that was adequate at 290,000. The vitamin D level was low at 28.4 and cholesterol numbers with traditional lipid testing have a total LDL C cholesterol that was elevated at 194 and triglycerides are good at 136. The good cholesterol was low.    Patient Active Problem List   Diagnosis Date Noted  . Type 2 diabetes mellitus with hyperlipidemia ([place]) 09/06/2015  . Type 2 diabetes mellitus with peripheral neuropathy ([place]) 11/27/2014  . CVA (cerebral vascular accident) ([place]) 02/27/2012  . Cerebral aneurysm 02/27/2012  . 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 Gait instability 02/27/2012  . Vision disturbance following cerebrovascular accident 02/27/2012  . Dyspnea 11/14/2011  . Thyroid disease 10/12/2008  . Diabetes ([place]) 08/29/2008  . Hyperlipidemia 08/29/2008  . OBESITY, UNSPECIFIED 08/29/2008  . COPD (chronic obstructive pulmonary disease) ([place]) 08/29/2008  . WEIGHT LOSS 08/29/2008  . DIARRHEA 08/29/2008  . HTN (hypertension) 08/28/2008  . ASTHMA 08/28/2008  . ESOPHAGEAL STRICTURE 08/28/2008  . GERD 08/28/2008  . HIATAL HERNIA  08/28/2008  . DIVERTICULOSIS, COLON 08/28/2008  . COLONIC POLYPS, HX OF 08/28/2008   Outpatient Encounter Prescriptions as of 01/22/2017  Medication Sig  . ALPRAZolam (XANAX) 0.25 MG tablet TAKE 1 TABLET BY MOUTH TWICE DAILY  . amLODipine (NORVASC) 10 MG tablet TAKE 1 TABLET BY MOUTH EVERY DAY  . aspirin 325 MG EC tablet Take 325 mg by mouth daily.  . AZASITE 1 % ophthalmic solution INSTILL 2 DROPS ON FIRST DAY AND 1 DROP EVERY DAY FOR THE NEXT 4 DAYS IN BOTH EYES  . azelastine (ASTELIN) 0.1 % nasal spray USE 1 SPRAY IN EACH NOSTRIL TWICE DAILY AS DIRECTED  . chlorpheniramine (CHLOR-TRIMETON) 4 MG tablet Take 2 mg by mouth daily as needed for allergies.  Marland Kitchen CINNAMON PO Take 2,000 mg by mouth daily.  . clopidogrel (PLAVIX) 75 MG tablet TAKE 1 TABLET BY MOUTH DAILY. (PLEASE FILL WITH AUROBINDO BRAND, PATIENT HAD ALLERGY TO DR REDDY)  . escitalopram (LEXAPRO) 10 MG tablet TAKE 1 TABLET BY MOUTH EVERY DAY  . glucose blood test strip Check BS BID and PRN. DX E11.9  . hydrochlorothiazide (MICROZIDE) 12.5 MG capsule TAKE 1 CAPSULE BY MOUTH EVERY DAY  . JANUVIA 50 MG tablet TAKE 1 TABLET BY MOUTH DAILY  . metFORMIN (GLUCOPHAGE) 1000 MG tablet TAKE 1 TABLET BY MOUTH EVERY MORNING WITH BREAKFAST  . metoCLOPramide (REGLAN) 10 MG tablet TAKE ONE TABLET BY MOUTH 1 TIME DAILY AS NEEDED  . montelukast (SINGULAIR) 10 MG tablet Take 1 tablet (10 mg total) by mouth at bedtime.  . Omega-3 Fatty Acids (FISH OIL) 1000 MG  CAPS Take 1 capsule by mouth daily.  . pantoprazole (PROTONIX) 40 MG tablet TAKE 1 TABLET BY MOUTH EVERY DAY --FILL ONLY WHEN PATIENT REQUESTS 03/14/2016 KR--  . VENTOLIN HFA 108 (90 Base) MCG/ACT inhaler INHALE 2 PUFFS INTO THE LUNGS EVERY 6 (SIX) HOURS AS NEEDED FOR WHEEZING OR SHORTNESS OF BREATH.  Marland Kitchen Vitamin D, Ergocalciferol, (DRISDOL) 50000 units CAPS capsule TAKE 1 CAPSULE BY MOUTH EVERY 7 DAYS.  . [DISCONTINUED] metaxalone (SKELAXIN) 800 MG tablet Take 1 tablet (800 mg total) by mouth at  bedtime. (Patient taking differently: Take 400 mg by mouth at bedtime. )  . [DISCONTINUED] traMADol (ULTRAM) 50 MG tablet Take 0.5-1 tablets (25-50 mg total) by mouth daily as needed. (Patient not taking: Reported on 10/17/2016)   No facility-administered encounter medications on [name] as of 01/22/2017.       Review of Systems  Constitutional: Negative.   HENT: Negative.   Eyes: Negative.   Respiratory: Negative.   Cardiovascular: Negative.   Gastrointestinal: Negative.   Endocrine: Negative.   Genitourinary: Negative.   Musculoskeletal: Negative.   Skin: Negative.   Allergic/Immunologic: Negative.   Neurological: Negative.   Hematological: Negative.   Psychiatric/Behavioral: Negative.        Objective:   Physical Exam  Constitutional: She is oriented to person, place, and time. She appears well-developed and well-nourished. No distress.  The patient is pleasant and alert  HENT:  Head: Normocephalic and atraumatic.  Right Ear: External ear normal.  Left Ear: External ear normal.  Nose: Nose normal.  Mouth/Throat: Oropharynx is clear and moist. No oropharyngeal exudate.  Eyes: Conjunctivae and EOM are normal. Pupils are equal, round, and reactive to light. Right eye exhibits no discharge. Left eye exhibits no discharge. No scleral icterus.  Neck: Normal range of motion. Neck supple. No thyromegaly present.  No thyromegaly anterior cervical adenopathy or bruits  Cardiovascular: Normal rate, regular rhythm, normal heart sounds and intact distal pulses.   No murmur heard. The heart had a regular rate and rhythm at 84/m  Pulmonary/Chest: Effort normal and breath sounds normal. No respiratory distress. She has no wheezes. She has no rales.  Clear anteriorly and posteriorly  Abdominal: Soft. Bowel sounds are normal. There is no tenderness. There is no rebound and no guarding.  Abdominal obesity without masses tenderness or organ enlargement  Musculoskeletal: Normal range of motion.  She exhibits no edema.  Lymphadenopathy:    She has no cervical adenopathy.  Neurological: She is alert and oriented to person, place, and time. She has normal reflexes. No cranial nerve deficit.  Skin: Skin is warm and dry. No rash noted.  Psychiatric: She has a normal mood and affect. Her behavior is normal. Judgment and thought content normal.  Nursing note and vitals reviewed.   BP 133/82 (BP Location: Right Arm)   Pulse 78   Temp (!) 96.6 F (35.9 C) (Oral)   Ht 5\' 2"  (1.575 m)   Wt 195 lb (88.5 kg)   BMI 35.67 kg/m        Assessment & Plan:  1. Essential hypertension -The blood pressure is good and she will continue with current treatment  2. Vitamin D deficiency -Continue with vitamin D replacement and try to take this regularly at 50,000 units weekly  3. Other hyperlipidemia -The patient will try Livalo 1 mg twice weekly she was given samples of this to try  4. Gastroesophageal reflux disease, esophagitis presence not specified -Continue with proton X and take this more regularly for 1  month and then as needed  5. Thyroid disease -No change in treatment  6. ASCVD (arteriosclerotic cardiovascular disease) -Continue with more aggressive therapeutic lifestyle changes and try statin drug low-dose twice weekly as directed  7. Type 2 diabetes mellitus with peripheral neuropathy (HCC) -The hemoglobin A1c was 7.5%. The patient will do better with her diet and aggressive therapeutic lifestyle changes and will take an extra 500 mg of metformin in the evening and continue the Januvia 50 once a day along with the thousand of metformin in the morning.  Patient Instructions                       Medicare Annual Wellness Visit  [facility] and the medical providers at [facility] strive to bring you the best medical care.  In doing so we not only want to address your current medical conditions and concerns but also to detect new conditions early and  prevent illness, disease and health-related problems.    Medicare offers a yearly Wellness Visit which allows our clinical staff to assess your need for preventative services including immunizations, lifestyle education, counseling to decrease risk of preventable diseases and screening for fall risk and other medical concerns.    This visit is provided free of charge (no copay) for all Medicare recipients. The clinical pharmacists at [facility] have begun to conduct these Wellness Visits which will also include a thorough review of all your medications.    As you primary medical provider recommend that you make an appointment for your Annual Wellness Visit if you have not done so already this year.  You may set up this appointment before you leave today or you may call back ([phone]) and schedule an appointment.  Please make sure when you call that you mention that you are scheduling your Annual Wellness Visit with the clinical pharmacist so that the appointment may be made for the proper length of time.     Continue current medications. Continue good therapeutic lifestyle changes which include good diet and exercise. Fall precautions discussed with patient. If an FOBT was given today- please return it to our front desk. If you are over 22 years old - you may need Prevnar 92 or the adult Pneumonia vaccine.  **Flu shots are available--- please call and schedule a FLU-CLINIC appointment**  After your visit with [place] today you will receive a survey in the mail or online from [employer] regarding your care with [place]. Please take a moment to fill this out. Your feedback is very important to [place] as you can help [place] better understand your patient needs as well as improve your experience and satisfaction. WE CARE ABOUT YOU!!!  Increase metformin take 1000 MG morning and 500 in the evening Continue Januvia as doing Add Livalo 2 mg one half tablet twice weekly and take samples and call  us when you run out so we can continue this medicine if it does not cause any side effects Please do not forget to get your eye exam and make sure we get a copy of that report Please return the FOBT if given one today.   Arrie Senate MD

## 2017-01-28 ENCOUNTER — Telehealth: Payer: Self-pay | Admitting: Family Medicine

## 2017-01-28 NOTE — Telephone Encounter (Signed)
Pt called - pt has 300 mg gabapentin - she takes these 1 at bedtime -- she thinks she took 2 last night   - she feels "a little groggy" this morning - she is getting better as the day goes by - she will be more careful not to do this again.  FYI

## 2017-01-29 ENCOUNTER — Other Ambulatory Visit: Payer: Self-pay | Admitting: Family Medicine

## 2017-01-30 NOTE — Telephone Encounter (Signed)
Last filled 11/27/16, last seen 01/22/17. Call in

## 2017-02-04 ENCOUNTER — Ambulatory Visit (INDEPENDENT_AMBULATORY_CARE_PROVIDER_SITE_OTHER): Payer: PPO | Admitting: Pharmacist

## 2017-02-04 DIAGNOSIS — E1142 Type 2 diabetes mellitus with diabetic polyneuropathy: Secondary | ICD-10-CM

## 2017-02-04 MED ORDER — FREESTYLE LIBRE SENSOR SYSTEM MISC: 3 refills | Status: DC

## 2017-02-04 MED ORDER — FREESTYLE LIBRE READER DEVI: 1.0000 | Freq: Every day | 0 refills | Status: DC

## 2017-02-04 MED ORDER — METFORMIN HCL 1000 MG PO TABS: ORAL_TABLET | ORAL | 1 refills | Status: DC

## 2017-02-04 NOTE — Progress Notes (Signed)
Patient ID: Elizabeth [name], female   DOB: 22-Feb-1941, 76 y.o.   MRN: [id number]  Patient did no qualify for Continuous Glucose Monitoring.  Medicare currently requires patient to take insulin at least 3 injections per day and to check BG at least 4 times per day.   Elizabeth [name] is currently taking only oral medications.  However at her providers request I will send in Rx and paperwork to verify coverage.

## 2017-02-18 ENCOUNTER — Other Ambulatory Visit: Payer: Self-pay | Admitting: Nurse Practitioner

## 2017-03-05 ENCOUNTER — Telehealth: Payer: Self-pay | Admitting: Family Medicine

## 2017-03-10 NOTE — Telephone Encounter (Signed)
Nothing needed at this time.  

## 2017-03-10 NOTE — Telephone Encounter (Signed)
Elizabeth [name], are you aware of anything that [facility] may need?

## 2017-03-12 ENCOUNTER — Other Ambulatory Visit: Payer: Self-pay | Admitting: Family Medicine

## 2017-03-17 ENCOUNTER — Other Ambulatory Visit: Payer: Self-pay | Admitting: Family Medicine

## 2017-03-26 ENCOUNTER — Other Ambulatory Visit: Payer: Self-pay | Admitting: Family Medicine

## 2017-03-26 ENCOUNTER — Other Ambulatory Visit: Payer: Self-pay | Admitting: Nurse Practitioner

## 2017-04-07 ENCOUNTER — Encounter: Payer: Self-pay | Admitting: Family Medicine

## 2017-04-07 ENCOUNTER — Ambulatory Visit (INDEPENDENT_AMBULATORY_CARE_PROVIDER_SITE_OTHER): Payer: PPO | Admitting: Family Medicine

## 2017-04-07 VITALS — BP 132/70 | HR 77 | Temp 97.4°F | Ht 62.0 in | Wt 191.0 lb

## 2017-04-07 DIAGNOSIS — J0100 Acute maxillary sinusitis, unspecified: Secondary | ICD-10-CM

## 2017-04-07 MED ORDER — DOXYCYCLINE HYCLATE 100 MG PO CAPS: 100.0000 mg | ORAL_CAPSULE | Freq: Two times a day (BID) | ORAL | 0 refills | Status: DC

## 2017-04-07 NOTE — Progress Notes (Signed)
Chief Complaint  Patient presents with  . Sinusitis    pt here today c/o sinus congestion and sinus drainage    HPI  Patient presents today for Symptoms include congestion, facial pain, nasal congestion, non productive cough, post nasal drip and sinus pressure. There is no fever, chills, or sweats. Onset of symptoms was a few days ago, gradually worsening since that time.    PMH: Smoking status noted ROS: Per HPI  Objective: BP 132/70   Pulse 77   Temp (!) 97.4 F (36.3 C) (Oral)   Ht 5\' 2"  (1.575 m)   Wt 191 lb (86.6 kg)   BMI 34.93 kg/m  Gen: NAD, alert, cooperative with exam HEENT: NCAT, EOMI, PERRL . Nasal passages swollen and exudative. Left frontal and left maxillary sinus are tender to percussion CV: RRR, good S1/S2, no murmur Resp: CTABL, no wheezes, non-labored Ext: No edema, warm Neuro: Alert and oriented, No gross deficits  Assessment and plan:  1. Acute maxillary sinusitis, recurrence not specified     Meds ordered this encounter  Medications  . doxycycline (VIBRAMYCIN) 100 MG capsule    Sig: Take 1 capsule (100 mg total) by mouth 2 (two) times daily.    Dispense:  20 capsule    Refill:  0    No orders of the defined types were placed in this encounter.   Follow up as needed.  Claretta Fraise, MD

## 2017-04-13 ENCOUNTER — Other Ambulatory Visit: Payer: Self-pay | Admitting: Family Medicine

## 2017-04-13 NOTE — Telephone Encounter (Signed)
Last Vit D 01/21/17  28.4     DWM

## 2017-04-16 ENCOUNTER — Other Ambulatory Visit: Payer: Self-pay | Admitting: Pediatrics

## 2017-04-20 ENCOUNTER — Telehealth: Payer: Self-pay | Admitting: Family Medicine

## 2017-04-20 MED ORDER — CLOPIDOGREL BISULFATE 75 MG PO TABS: ORAL_TABLET | ORAL | 1 refills | Status: DC

## 2017-04-20 NOTE — Telephone Encounter (Signed)
resent

## 2017-04-27 ENCOUNTER — Other Ambulatory Visit: Payer: Self-pay | Admitting: Nurse Practitioner

## 2017-05-01 ENCOUNTER — Telehealth: Payer: Self-pay | Admitting: Family Medicine

## 2017-05-01 NOTE — Telephone Encounter (Signed)
Please tell her to stick strictly with her diet and to check her blood sugars periodically if they start running higher that she should get in touch with [place] and we can try something different. Also discuss this with Elizabeth [name] she is still here today and she she has any other suggestions for [facility] that would be cheaper options

## 2017-05-01 NOTE — Telephone Encounter (Signed)
Please advise 

## 2017-05-01 NOTE — Telephone Encounter (Signed)
FYI

## 2017-05-06 ENCOUNTER — Telehealth: Payer: Self-pay | Admitting: Family Medicine

## 2017-05-06 NOTE — Telephone Encounter (Signed)
Please advise 

## 2017-05-06 NOTE — Telephone Encounter (Signed)
The last note that I saw from the patient was that she did not want anything to replace Januvia. If her blood sugars are running high she should first of all increase her metformin to 1000mg  twice daily and bring readings by the office in a couple weeks and if they're still running high at that time we can consider adding glimepiride 2 mg once daily or jardiance

## 2017-05-07 NOTE — Telephone Encounter (Signed)
Patient aware of recommendations. Patient also wants to know if we can try and send her to [facility] for help. Tye [place] can you help her with this

## 2017-05-08 ENCOUNTER — Telehealth: Payer: Self-pay | Admitting: Pharmacist

## 2017-05-08 NOTE — Patient Outreach (Signed)
[place] [facility]) Care Management  05/08/2017  Elizabeth [name] 1940/10/21 [id number]   76 year old female referred to [place] [facility] by [employer]. [place] services requested for medication assistance.  PMHx includes, but not limited to, type 2 diabetes with peripheral neuropathy, CVA, thyroid disease, HTN, HLD, COPD, asthma, GERD, and diverticulosis.    Unsuccessful telephone outreach call to patient today.  I left a HIPAA compliant message with a female who answered phone.   Plan: I will call [name] again next week regarding medication assistance.   Ralene Bathe, PharmD, [place] [phone]

## 2017-05-12 ENCOUNTER — Other Ambulatory Visit: Payer: Self-pay | Admitting: Pharmacy Technician

## 2017-05-12 ENCOUNTER — Ambulatory Visit: Payer: Self-pay | Admitting: Pharmacist

## 2017-05-12 ENCOUNTER — Telehealth: Payer: Self-pay | Admitting: Pharmacist

## 2017-05-12 NOTE — Patient Outreach (Signed)
[place] [facility] Care Management  [place]   05/12/2017  Gloriajean Okun Rubey 12-17-1940 [id number]   76 year old female referred to [place] [facility] by [employer]. [place] services requested for medication assistance.  PMHx includes, but not limited to, type 2 diabetes with peripheral neuropathy, CVA, thyroid disease, HTN, HLD, COPD, asthma, GERD, and diverticulosis.    Incoming call received from patient this morning.  HIPAA identifiers verified.  Subjective:  Elizabeth [name] reports she is in the "donut hole" and is having trouble affording sitagliptin. She states she ran out of the sitagliptin and is now taking metformin 1000mg  twice a day as instructed by her provider, Dr. Laurance Flatten.  She reports she called [employer] to inquire about patient assistance but was told they don't provide help.  Ms. Moldovan estimates her annual household income is $32,400 for a household of 2.   Objective:  Hemoglobin A1c =7.5 (01/21/2017) SCr = 0.72  Encounter Medications: Outpatient Encounter Prescriptions as of 05/12/2017  Medication Sig  . ALPRAZolam (XANAX) 0.25 MG tablet TAKE ONE TABLET BY MOUTH TWICE DAILY  . amLODipine (NORVASC) 10 MG tablet TAKE 1 TABLET BY MOUTH EVERY DAY  . aspirin 325 MG EC tablet Take 325 mg by mouth daily.  . AZASITE 1 % ophthalmic solution INSTILL 2 DROPS ON FIRST DAY AND 1 DROP EVERY DAY FOR THE NEXT 4 DAYS IN BOTH EYES  . azelastine (ASTELIN) 0.1 % nasal spray USE 1 SPRAY IN EACH NOSTRIL TWICE DAILY AS DIRECTED  . chlorpheniramine (CHLOR-TRIMETON) 4 MG tablet Take 2 mg by mouth daily as needed for allergies.  Marland Kitchen CINNAMON PO Take 2,000 mg by mouth daily.  . clopidogrel (PLAVIX) 75 MG tablet TAKE ONE TABLET BY MOUTH DAILY (PLEASE USE AUROBINDO BRAND, PT HD FOR ALLERGY TO REDDY)  . Continuous Blood Gluc Receiver (FREESTYLE LIBRE READER) DEVI 1 each by Does not apply route daily. Use to check BG daily with freestyle libre sensors  . Continuous Blood Gluc Sensor  (FREESTYLE LIBRE SENSOR SYSTEM) MISC Use to check BG daily.  Change sensors every 10 days  . doxycycline (VIBRAMYCIN) 100 MG capsule Take 1 capsule (100 mg total) by mouth 2 (two) times daily.  Marland Kitchen escitalopram (LEXAPRO) 10 MG tablet TAKE 1 TABLET BY MOUTH EVERY DAY  . glucose blood test strip Check BS BID and PRN. DX E11.9  . hydrochlorothiazide (MICROZIDE) 12.5 MG capsule TAKE 1 CAPSULE BY MOUTH EVERY DAY  . JANUVIA 50 MG tablet TAKE ONE TABLET BY MOUTH DAILY  . metFORMIN (GLUCOPHAGE) 1000 MG tablet TAKE 1 TABLET BY MOUTH EVERY MORNING WITH BREAKFAST and 1/2 tablet in evening with food  . metoCLOPramide (REGLAN) 10 MG tablet TAKE ONE TABLET BY MOUTH 1 TIME DAILY AS NEEDED  . montelukast (SINGULAIR) 10 MG tablet Take 1 tablet (10 mg total) by mouth at bedtime.  . Omega-3 Fatty Acids (FISH OIL) 1000 MG CAPS Take 1 capsule by mouth daily.  . pantoprazole (PROTONIX) 40 MG tablet TAKE 1 TABLET BY MOUTH EVERY DAY --FILL ONLY WHEN PATIENT REQUESTS - EMERGENCY REFILL FAXED DR  . VENTOLIN HFA 108 (90 Base) MCG/ACT inhaler INHALE 2 PUFFS EVERY SIX HOURS AS NEEDED FOR WHEEZING OR SHORTNESS OF BREATH  . Vitamin D, Ergocalciferol, (DRISDOL) 50000 units CAPS capsule TAKE 1 CAPSULE BY MOUTH EVERY 7 DAYS.   No facility-administered encounter medications on file as of 05/12/2017.     Functional Status: No flowsheet data found.  Fall/Depression Screening: Fall Risk  04/07/2017 01/22/2017  10/17/2016  Falls in the past year? No No No   PHQ 2/9 Scores 04/07/2017 01/22/2017 10/17/2016 08/25/2016 08/19/2016 06/18/2016 01/30/2016  PHQ - 2 Score 0 0 0 0 0 1 0  PHQ- 9 Score - - - - - - -      Assessment:   Drugs sorted by system:  Neurologic/Psychologic: Alprazolam, escitalopram  Cardiovascular: Amlodipine, ASA 325mg , clopidogrel, hydrochlorothiazide, fish oil  Pulmonary/Allergy: PRN chlorpheniramine, PRN fluticasone, PRN albuterol  Gastrointestinal: PRN metoclopramide, pantoprazole   Endocrine:Sitagliptin,  metformin  Vitamins/Minerals:Vitamin D  Miscellaneous: Cinnamon  Medications to avoid in the elderly:  Alprazolam: This drug is identified in the [place] Criteria as a potentially inappropriate medication to be avoided in patients 65 years and older (independent of diagnosis or condition) due to increased risk of impaired cognition, delirium, falls, fractures, and motor vehicle accidents with benzodiazepine use  Medication assistance: Patient preliminarily is eligible for patient assistance program through [employer] for sitagliptin based on estimated annual income of $32,400 for a household of 2.  [employer] requires all original documents therefore no faxes are accepted.    Plan: I will route patient assistance letter to pharmacy technician, Doreene Burke, who will coordinate application process for sitagliptin and assist with obtaining all pertinent documents from both patient and provider and submit application once completed.  Patient has agreed to deliver application once completed to Dr. Laurance Flatten who will mail back to [facility] with enclosed return envelope once health care provider documents are completed.   We will follow-up with patient in 2-3 weeks.   Ralene Bathe, PharmD, [place] [phone]

## 2017-05-12 NOTE — Patient Outreach (Signed)
[facility]) Care Management  05/12/2017  [place] March 09, 1941 [id number]  I am mailing a [employer] patient assistance application for Januvia to Ms. Hollingshed today. The patient was referred to me by [name], Rph who has made the patient aware that the application will be arriving and what she needs to do to complete it. Per [name] the patient is taking the prescriber portion to her physician's office along with a return envelope that I am providing.  Doreene Burke, [place] [phone]

## 2017-05-13 NOTE — Telephone Encounter (Signed)
Pt said she was getting a form in the mail

## 2017-05-17 ENCOUNTER — Other Ambulatory Visit: Payer: Self-pay | Admitting: Family Medicine

## 2017-05-20 ENCOUNTER — Telehealth: Payer: Self-pay | Admitting: Pharmacist

## 2017-05-20 NOTE — Patient Outreach (Signed)
[facility]) Care Management  05/20/2017  [place] 1941-02-10 [id number]  Incoming call received from Ms. Ammon. HIPAA identifiers verified. Ms. Conrow reports she completed the mailed patient assistance application forms for sitagliptin and brought them to Dr. Tawanna Sat office today with included return envelope.    Plan: I will follow-up with Dr. Tawanna Sat office in the next 1-2 weeks regarding patient assistance application if I have not received mailed documents back beforehand.   Ralene Bathe, PharmD, [place] [phone]

## 2017-06-02 ENCOUNTER — Other Ambulatory Visit: Payer: Self-pay | Admitting: Pharmacy Technician

## 2017-06-02 NOTE — Patient Outreach (Signed)
[facility]) Care Management  06/02/2017  [place] Jun 01, 1941 [id number]  I contacted patient today in reference to [employer] patient assistance application for Januvia that was mailed on 05-12-2017. Per Ms. Sanluis's previous conversation with [name], Rph she had completed the patient portion of the application and was taking it to Dr. Laurance Flatten along with the return envelope that I included to complete the provider portion and have the office mail to [facility]. I have not received the application as of yet so I first followed-up with the patient to make sure she had dropped it off. The patient states she has an appointment with Dr. Laurance Flatten tomorrow and she will discuss the status of the application at that time.  Doreene Burke, [place] [phone]

## 2017-06-03 ENCOUNTER — Encounter: Payer: Self-pay | Admitting: Family Medicine

## 2017-06-03 ENCOUNTER — Other Ambulatory Visit: Payer: Self-pay | Admitting: Family Medicine

## 2017-06-03 ENCOUNTER — Ambulatory Visit (INDEPENDENT_AMBULATORY_CARE_PROVIDER_SITE_OTHER): Payer: PPO | Admitting: Family Medicine

## 2017-06-03 VITALS — BP 138/80 | HR 76 | Temp 97.9°F | Ht 62.0 in | Wt 184.0 lb

## 2017-06-03 DIAGNOSIS — E1142 Type 2 diabetes mellitus with diabetic polyneuropathy: Secondary | ICD-10-CM

## 2017-06-03 DIAGNOSIS — I10 Essential (primary) hypertension: Secondary | ICD-10-CM

## 2017-06-03 DIAGNOSIS — E079 Disorder of thyroid, unspecified: Secondary | ICD-10-CM

## 2017-06-03 DIAGNOSIS — E784 Other hyperlipidemia: Secondary | ICD-10-CM | POA: Diagnosis not present

## 2017-06-03 DIAGNOSIS — I2510 Atherosclerotic heart disease of native coronary artery without angina pectoris: Secondary | ICD-10-CM

## 2017-06-03 DIAGNOSIS — Z780 Asymptomatic menopausal state: Secondary | ICD-10-CM

## 2017-06-03 DIAGNOSIS — E7849 Other hyperlipidemia: Secondary | ICD-10-CM

## 2017-06-03 DIAGNOSIS — K219 Gastro-esophageal reflux disease without esophagitis: Secondary | ICD-10-CM | POA: Diagnosis not present

## 2017-06-03 DIAGNOSIS — E559 Vitamin D deficiency, unspecified: Secondary | ICD-10-CM

## 2017-06-03 MED ORDER — VITAMIN D (ERGOCALCIFEROL) 1.25 MG (50000 UNIT) PO CAPS: ORAL_CAPSULE | ORAL | 0 refills | Status: DC

## 2017-06-03 MED ORDER — AZITHROMYCIN 250 MG PO TABS: ORAL_TABLET | ORAL | 0 refills | Status: DC

## 2017-06-03 NOTE — Progress Notes (Signed)
Subjective:    Patient ID: Elizabeth [name], female    DOB: [birth year], 76 y.o.   MRN: [id number]  HPI Pt here for follow up and management of chronic medical problems which includes hyperlipidemia, diabetes and hypertension. She is taking medication regularly.The patient denies any chest pain. She does have some shortness of breath recently but this is associated with the headache congestion and drainage and cough that she has had over the past 4 weeks. She is not running any fever and the color of the drainage is more grayish white in color with some of this being coughed up. She's had some headache associated with this. She does not want to take any antibiotics. She is doing some better using nasal saline frequently and taking Coricidin HBP. She is also has some increased problems with reflux and hiccuping but does associate this also with the drainage. Otherwise her bowel habits are stable and she denies any nausea vomiting or diarrhea. She does have occasional constipation. She is passing her water without problems. A fasting blood sugar this morning at home was 112. Were having problems getting her Januvia approved and were still working on that and we'll see if we can find some samples to give her during the visit today.      Patient Active Problem List   Diagnosis Date Noted  . Type 2 diabetes mellitus with hyperlipidemia ([place]) 09/06/2015  . Type 2 diabetes mellitus with peripheral neuropathy ([place]) 11/27/2014  . CVA (cerebral vascular accident) ([place]) 02/27/2012  . Cerebral aneurysm 02/27/2012  . 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 Gait instability 02/27/2012  . Vision disturbance following cerebrovascular accident 02/27/2012  . Dyspnea 11/14/2011  . Thyroid disease 10/12/2008  . Diabetes ([place]) 08/29/2008  . Hyperlipidemia 08/29/2008  . OBESITY, UNSPECIFIED 08/29/2008  . COPD (chronic obstructive pulmonary disease) ([place]) 08/29/2008  . WEIGHT LOSS 08/29/2008  . DIARRHEA 08/29/2008    . HTN (hypertension) 08/28/2008  . ASTHMA 08/28/2008  . ESOPHAGEAL STRICTURE 08/28/2008  . GERD 08/28/2008  . HIATAL HERNIA 08/28/2008  . DIVERTICULOSIS, COLON 08/28/2008  . COLONIC POLYPS, HX OF 08/28/2008   Outpatient Encounter Prescriptions as of 06/03/2017  Medication Sig  . ALPRAZolam (XANAX) 0.25 MG tablet TAKE ONE TABLET BY MOUTH TWICE DAILY (Patient taking differently: TAKE ONE TABLET BY MOUTH once daily)  . amLODipine (NORVASC) 10 MG tablet TAKE 1 TABLET BY MOUTH EVERY DAY  . aspirin 325 MG EC tablet Take 325 mg by mouth daily.  Marland Kitchen CINNAMON PO Take 2,000 mg by mouth daily.  . clopidogrel (PLAVIX) 75 MG tablet TAKE ONE TABLET BY MOUTH DAILY (PLEASE USE AUROBINDO BRAND, PT HD FOR ALLERGY TO REDDY)  . escitalopram (LEXAPRO) 10 MG tablet TAKE 1 TABLET BY MOUTH EVERY DAY  . glucose blood test strip Check BS BID and PRN. DX E11.9  . hydrochlorothiazide (MICROZIDE) 12.5 MG capsule TAKE 1 CAPSULE BY MOUTH EVERY DAY  . JANUVIA 50 MG tablet TAKE ONE TABLET BY MOUTH DAILY  . metFORMIN (GLUCOPHAGE) 1000 MG tablet TAKE 1 TABLET BY MOUTH EVERY MORNING WITH BREAKFAST and 1/2 tablet in evening with food (Patient taking differently: 1,000 mg 2 (two) times daily with a meal. TAKE 1 TABLET BY MOUTH EVERY MORNING WITH BREAKFAST and 1/2 tablet in evening with food)  . metoCLOPramide (REGLAN) 10 MG tablet TAKE ONE TABLET BY MOUTH 1 TIME DAILY AS NEEDED  . montelukast (SINGULAIR) 10 MG tablet Take 1 tablet (10 mg total) by mouth at bedtime.  . Omega-3  Fatty Acids (FISH OIL) 1000 MG CAPS Take 1 capsule by mouth daily.  . pantoprazole (PROTONIX) 40 MG tablet TAKE 1 TABLET BY MOUTH EVERY DAY --FILL ONLY WHEN PATIENT REQUESTS - EMERGENCY REFILL FAXED DR  . VENTOLIN HFA 108 (90 Base) MCG/ACT inhaler INHALE 2 PUFFS EVERY SIX HOURS AS NEEDED FOR WHEEZING OR SHORTNESS OF BREATH  . Vitamin D, Ergocalciferol, (DRISDOL) 50000 units CAPS capsule TAKE 1 CAPSULE BY MOUTH EVERY 7 DAYS. (Patient taking differently: TAKE  1 CAPSULE BY MOUTH EVERY 7 DAYS. takes on Thursdays)  . [DISCONTINUED] chlorpheniramine (CHLOR-TRIMETON) 4 MG tablet Take 2 mg by mouth daily as needed for allergies.  . [DISCONTINUED] Continuous Blood Gluc Receiver (FREESTYLE LIBRE READER) DEVI 1 each by Does not apply route daily. Use to check BG daily with freestyle libre sensors  . [DISCONTINUED] Continuous Blood Gluc Sensor (FREESTYLE LIBRE SENSOR SYSTEM) MISC Use to check BG daily.  Change sensors every 10 days  . [DISCONTINUED] fluticasone (VERAMYST) 27.5 MCG/SPRAY nasal spray Place 1 spray into the nose daily as needed for rhinitis.   No facility-administered encounter medications on [name] as of 06/03/2017.      Review of Systems  Constitutional: Negative.   HENT: Positive for congestion and postnasal drip.   Eyes: Negative.   Respiratory: Negative.   Cardiovascular: Negative.   Gastrointestinal: Negative.   Endocrine: Negative.   Genitourinary: Negative.   Musculoskeletal: Negative.   Skin: Negative.   Allergic/Immunologic: Negative.   Neurological: Negative.   Hematological: Negative.   Psychiatric/Behavioral: Positive for agitation.       Objective:   Physical Exam  Constitutional: She is oriented to person, place, and time. She appears well-developed and well-nourished. No distress.  The patient is pleasant and relaxed and somewhat hoarse  HENT:  Head: Normocephalic and atraumatic.  Right Ear: External ear normal.  Left Ear: External ear normal.  Nose: Nose normal.  Mouth/Throat: Oropharynx is clear and moist. No oropharyngeal exudate.  Slight ethmoid tenderness  Eyes: Pupils are equal, round, and reactive to light. Conjunctivae and EOM are normal. Right eye exhibits no discharge. Left eye exhibits no discharge. No scleral icterus.  Discharge  Neck: Normal range of motion. Neck supple. No thyromegaly present.  No bruits thyromegaly or anterior cervical adenopathy  Cardiovascular: Normal rate, regular rhythm, normal  heart sounds and intact distal pulses.   No murmur heard. Heart is regular at 72/m  Pulmonary/Chest: Effort normal and breath sounds normal. No respiratory distress. She has no wheezes. She has no rales.  Dry cough  Abdominal: Soft. Bowel sounds are normal. She exhibits no mass. There is no tenderness. There is no rebound and no guarding.  No abdominal tenderness masses bruits or organ enlargement  Musculoskeletal: Normal range of motion. She exhibits no edema.  Lymphadenopathy:    She has no cervical adenopathy.  Neurological: She is alert and oriented to person, place, and time. She has normal reflexes. No cranial nerve deficit.  Skin: Skin is warm and dry. No rash noted.  Psychiatric: She has a normal mood and affect. Her behavior is normal. Judgment and thought content normal.  Nursing note and vitals reviewed.  BP (!) 144/67 (BP Location: Left Arm)   Pulse 76   Temp 97.9 F (36.6 C) (Oral)   Ht '5\' 2"'  (1.575 m)   Wt 184 lb (83.5 kg)   BMI 33.65 kg/m         Assessment & Plan:  1. Type 2 diabetes mellitus with peripheral neuropathy (HCC) -Blood sugar  at home has been good and even though the patient has not been taking her Januvia regularly because she is waiting get that approved by the drug company. - CBC with Differential/Platelet; Future - Microalbumin / creatinine urine ratio; Future - Bayer DCA Hb A1c Waived; Future  2. Essential hypertension -Repeat blood pressure was better at 130/88 in the right arm with a large cuff and she will continue with her current treatment - BMP8+EGFR; Future - CBC with Differential/Platelet; Future - Hepatic function panel; Future  3. Vitamin D deficiency -Continue vitamin D replacement pending results of lab work - CBC with Differential/Platelet; Future - VITAMIN D 25 Hydroxy (Vit-D Deficiency, Fractures); Future  4. Other hyperlipidemia -Continue with aggressive therapeutic lifestyle changes - CBC with Differential/Platelet;  Future - Lipid panel; Future  5. Gastroesophageal reflux disease, esophagitis presence not specified -Continue with watching diet closely and current dose of pantoprazole and she is not having any complaints with this today. - CBC with Differential/Platelet; Future  6. Thyroid disease - CBC with Differential/Platelet; Future  7. ASCVD (arteriosclerotic cardiovascular disease) -Continue aggressive therapeutic lifestyle changes including weight loss diet and exercise  Meds ordered this encounter  Medications  . Vitamin D, Ergocalciferol, (DRISDOL) 50000 units CAPS capsule    Sig: TAKE 1 CAPSULE BY MOUTH EVERY 7 DAYS.    Dispense:  12 capsule    Refill:  0  . azithromycin (ZITHROMAX) 250 MG tablet    Sig: As directed    Dispense:  6 tablet    Refill:  0   Patient Instructions                       Medicare Annual Wellness Visit  [facility] and the medical providers at [facility] strive to bring you the best medical care.  In doing so we not only want to address your current medical conditions and concerns but also to detect new conditions early and prevent illness, disease and health-related problems.    Medicare offers a yearly Wellness Visit which allows our clinical staff to assess your need for preventative services including immunizations, lifestyle education, counseling to decrease risk of preventable diseases and screening for fall risk and other medical concerns.    This visit is provided free of charge (no copay) for all Medicare recipients. The clinical pharmacists at [facility] have begun to conduct these Wellness Visits which will also include a thorough review of all your medications.    As you primary medical provider recommend that you make an appointment for your Annual Wellness Visit if you have not done so already this year.  You may set up this appointment before you leave today or you may call back ([phone]) and  schedule an appointment.  Please make sure when you call that you mention that you are scheduling your Annual Wellness Visit with the clinical pharmacist so that the appointment may be made for the proper length of time.     Continue current medications. Continue good therapeutic lifestyle changes which include good diet and exercise. Fall precautions discussed with patient. If an FOBT was given today- please return it to our front desk. If you are over 47 years old - you may need Prevnar 78 or the adult Pneumonia vaccine.  **Flu shots are available--- please call and schedule a FLU-CLINIC appointment**  After your visit with [place] today you will receive a survey in the mail or online from [employer] regarding your care  with [place]. Please take a moment to fill this out. Your feedback is very important to [place] as you can help [place] better understand your patient needs as well as improve your experience and satisfaction. WE CARE ABOUT YOU!!!     Arrie Senate MD  - CBC with Differential/Platelet; Future

## 2017-06-03 NOTE — Patient Instructions (Signed)
Medicare Annual Wellness Visit  [facility] [place] and the medical providers at [facility] strive to bring you the best medical care.  In doing so we not only want to address your current medical conditions and concerns but also to detect new conditions early and prevent illness, disease and health-related problems.    Medicare offers a yearly Wellness Visit which allows our clinical staff to assess your need for preventative services including immunizations, lifestyle education, counseling to decrease risk of preventable diseases and screening for fall risk and other medical concerns.    This visit is provided free of charge (no copay) for all Medicare recipients. The clinical pharmacists at [facility] have begun to conduct these Wellness Visits which will also include a thorough review of all your medications.    As you primary medical provider recommend that you make an appointment for your Annual Wellness Visit if you have not done so already this year.  You may set up this appointment before you leave today or you may call back ([phone]) and schedule an appointment.  Please make sure when you call that you mention that you are scheduling your Annual Wellness Visit with the clinical pharmacist so that the appointment may be made for the proper length of time.     Continue current medications. Continue good therapeutic lifestyle changes which include good diet and exercise. Fall precautions discussed with patient. If an FOBT was given today- please return it to our front desk. If you are over 50 years old - you may need Prevnar 13 or the adult Pneumonia vaccine.  **Flu shots are available--- please call and schedule a FLU-CLINIC appointment**  After your visit with us today you will receive a survey in the mail or online from [employer] regarding your care with us. Please take a moment to fill this out. Your feedback is very  important to us as you can help us better understand your patient needs as well as improve your experience and satisfaction. WE CARE ABOUT YOU!!!    

## 2017-06-16 ENCOUNTER — Ambulatory Visit: Payer: PPO | Admitting: Family Medicine

## 2017-06-16 ENCOUNTER — Other Ambulatory Visit: Payer: Self-pay | Admitting: Pharmacist

## 2017-06-16 ENCOUNTER — Other Ambulatory Visit: Payer: Self-pay | Admitting: Pharmacy Technician

## 2017-06-16 NOTE — Patient Outreach (Signed)
[facility]) Care Management  06/16/2017  Elizabeth [name] 03/21/1941 [id number]  Patient spoke with Ralene Bathe, Rph in reference to [employer] patient assistance application that was sent to her on 05/12/2017. I last contacted the patient on 06/02/17 to inform her that I had not received the application from Dr. Tawanna Sat office and per our conversation she was going in for an appointment and would discuss it with him at that time. The patient followed up with [name] today and stated the office mailed it on 05/20/2017. Since I have not received it as of yet I'm mailing another application to the patient along with the prescriber portion of the application. Per [name] [name] will hand deliver the application to [facility] once it's completed.  Doreene Burke, [place] [phone]

## 2017-06-16 NOTE — Patient Outreach (Signed)
[facility]) Care Management  06/16/2017  Mykael Trott Stankowski Jul 23, 1941 [id number]   Incoming call received from Ms. Klauer today. HIPAA identifiers verified. Ms. Lebo is inquiring about the status of her [employer] application.  She reports Dr. Laurance Flatten gave her a copy of the application and mailed it back to [facility] on 05/20/2017 however [facility] has not received this yet.  Ms. Nisley reports she is willing to complete the application again, bring the provider portion to Dr. Laurance Flatten, and then deliver it to [facility] office in [place].  I spoke with [facility] pharmacy technician, Doreene Burke, who will mail patient a new application today.   Plan: Follow-up with patient next week to ensure she has received application in the mail.   Ralene Bathe, PharmD, [place] [phone]

## 2017-06-17 ENCOUNTER — Encounter: Payer: Self-pay | Admitting: Family Medicine

## 2017-06-17 ENCOUNTER — Ambulatory Visit (INDEPENDENT_AMBULATORY_CARE_PROVIDER_SITE_OTHER): Payer: PPO | Admitting: Family Medicine

## 2017-06-17 ENCOUNTER — Ambulatory Visit: Payer: PPO | Admitting: Family Medicine

## 2017-06-17 VITALS — BP 133/62 | HR 70 | Temp 97.2°F | Ht 62.0 in | Wt 181.0 lb

## 2017-06-17 DIAGNOSIS — I2510 Atherosclerotic heart disease of native coronary artery without angina pectoris: Secondary | ICD-10-CM

## 2017-06-17 DIAGNOSIS — E559 Vitamin D deficiency, unspecified: Secondary | ICD-10-CM

## 2017-06-17 DIAGNOSIS — I10 Essential (primary) hypertension: Secondary | ICD-10-CM | POA: Diagnosis not present

## 2017-06-17 DIAGNOSIS — E1142 Type 2 diabetes mellitus with diabetic polyneuropathy: Secondary | ICD-10-CM | POA: Diagnosis not present

## 2017-06-17 DIAGNOSIS — K219 Gastro-esophageal reflux disease without esophagitis: Secondary | ICD-10-CM | POA: Diagnosis not present

## 2017-06-17 DIAGNOSIS — E7849 Other hyperlipidemia: Secondary | ICD-10-CM

## 2017-06-17 DIAGNOSIS — E079 Disorder of thyroid, unspecified: Secondary | ICD-10-CM | POA: Diagnosis not present

## 2017-06-17 DIAGNOSIS — J3089 Other allergic rhinitis: Secondary | ICD-10-CM | POA: Diagnosis not present

## 2017-06-17 LAB — BAYER DCA HB A1C WAIVED: HB A1C (BAYER DCA - WAIVED): 6.6 % (ref <7.0)

## 2017-06-17 NOTE — Progress Notes (Signed)
BP 133/62 (BP Location: Right Arm)   Pulse 70   Temp (!) 97.2 F (36.2 C) (Oral)   Ht 5\' 2"  (1.575 m)   Wt 181 lb (82.1 kg)   BMI 33.11 kg/m    Subjective:    Patient ID: Elizabeth [name], female    DOB: 07-11-1941, 76 y.o.   MRN: [id number]  HPI: Elizabeth [name] is a 76 y.o. female presenting on 06/17/2017 for Dizziness (has had fever the last few days) and Nasal Congestion   HPI Patient has been having nasal congestion and sinus pressure and dizziness past 3 weeks. She says the dizziness has come and gone but is gone today but she still has some sinus pressure but she did start taking Benadryl and Rhinocort yesterday and she feels like it is starting to drain. She has not had any true fevers but has had a temperature of 99. She denies any shortness of breath or wheezing. She does have a cough that is mostly dry and nonproductive. She denies any sick contacts that she knows of.  Relevant past medical, surgical, family and social history reviewed and updated as indicated. Interim medical history since our last visit reviewed. Allergies and medications reviewed and updated.  Review of Systems  Constitutional: Negative for chills and fever.  HENT: Positive for congestion, postnasal drip, rhinorrhea, sinus pressure, sneezing and sore throat. Negative for ear discharge and ear pain.   Eyes: Negative for pain, redness and visual disturbance.  Respiratory: Positive for cough. Negative for chest tightness and shortness of breath.   Cardiovascular: Negative for chest pain and leg swelling.  Genitourinary: Negative for difficulty urinating and dysuria.  Musculoskeletal: Negative for back pain and gait problem.  Skin: Negative for rash.  Neurological: Positive for dizziness (spinning). Negative for light-headedness and headaches.  Psychiatric/Behavioral: Negative for agitation and behavioral problems.  All other systems reviewed and are negative.   Per HPI unless specifically indicated  above        Objective:    BP 133/62 (BP Location: Right Arm)   Pulse 70   Temp (!) 97.2 F (36.2 C) (Oral)   Ht 5\' 2"  (1.575 m)   Wt 181 lb (82.1 kg)   BMI 33.11 kg/m   Wt Readings from Last 3 Encounters:  06/17/17 181 lb (82.1 kg)  06/03/17 184 lb (83.5 kg)  04/07/17 191 lb (86.6 kg)    Physical Exam  Constitutional: She is oriented to person, place, and time. She appears well-developed and well-nourished. No distress.  HENT:  Right Ear: Tympanic membrane, external ear and ear canal normal.  Left Ear: Tympanic membrane, external ear and ear canal normal.  Nose: Mucosal edema and rhinorrhea present. No epistaxis. Right sinus exhibits no maxillary sinus tenderness and no frontal sinus tenderness. Left sinus exhibits no maxillary sinus tenderness and no frontal sinus tenderness.  Mouth/Throat: Uvula is midline and mucous membranes are normal. Posterior oropharyngeal edema and posterior oropharyngeal erythema present. No oropharyngeal exudate or tonsillar abscesses.  Eyes: Conjunctivae and EOM are normal.  Cardiovascular: Normal rate, regular rhythm, normal heart sounds and intact distal pulses.   No murmur heard. Pulmonary/Chest: Effort normal and breath sounds normal. No respiratory distress. She has no wheezes. She has no rales.  Musculoskeletal: Normal range of motion.  Neurological: She is alert and oriented to person, place, and time. Coordination normal.  Skin: Skin is warm and dry. No rash noted. She is not diaphoretic.  Psychiatric: She has a normal mood and affect. Her  behavior is normal.  Vitals reviewed.       Assessment & Plan:   Problem List Items Addressed This Visit    None    Visit Diagnoses    Non-seasonal allergic rhinitis due to other allergic trigger    -  Primary   use rhinocort and benadryl       Follow up plan: Return if symptoms worsen or fail to improve.  Counseling provided for all of the vaccine components No orders of the defined  types were placed in this encounter.   Caryl Pina, MD [facility] 06/17/2017, 8:13 AM

## 2017-06-17 NOTE — Addendum Note (Signed)
Addended by: Earlene Plater on: 06/17/2017 08:19 AM   Modules accepted: Orders

## 2017-06-18 ENCOUNTER — Telehealth: Payer: Self-pay | Admitting: Family Medicine

## 2017-06-18 ENCOUNTER — Ambulatory Visit: Payer: PPO | Admitting: Family Medicine

## 2017-06-18 LAB — CBC WITH DIFFERENTIAL/PLATELET
Basophils Absolute: 0 10*3/uL (ref 0.0–0.2)
Basos: 1 %
EOS (ABSOLUTE): 0.2 10*3/uL (ref 0.0–0.4)
Eos: 3 %
Hematocrit: 38.5 % (ref 34.0–46.6)
Hemoglobin: 12.3 g/dL (ref 11.1–15.9)
Immature Grans (Abs): 0 10*3/uL (ref 0.0–0.1)
Immature Granulocytes: 0 %
Lymphocytes Absolute: 1.6 10*3/uL (ref 0.7–3.1)
Lymphs: 28 %
MCH: 26.3 pg — ABNORMAL LOW (ref 26.6–33.0)
MCHC: 31.9 g/dL (ref 31.5–35.7)
MCV: 82 fL (ref 79–97)
Monocytes Absolute: 0.3 10*3/uL (ref 0.1–0.9)
Monocytes: 6 %
Neutrophils Absolute: 3.6 10*3/uL (ref 1.4–7.0)
Neutrophils: 62 %
Platelets: 348 10*3/uL (ref 150–379)
RBC: 4.67 x10E6/uL (ref 3.77–5.28)
RDW: 14.2 % (ref 12.3–15.4)
WBC: 5.7 10*3/uL (ref 3.4–10.8)

## 2017-06-18 LAB — BMP8+EGFR
BUN/Creatinine Ratio: 9 — ABNORMAL LOW (ref 12–28)
BUN: 7 mg/dL — ABNORMAL LOW (ref 8–27)
CO2: 23 mmol/L (ref 20–29)
Calcium: 10.3 mg/dL (ref 8.7–10.3)
Chloride: 96 mmol/L (ref 96–106)
Creatinine, Ser: 0.75 mg/dL (ref 0.57–1.00)
GFR calc Af Amer: 91 mL/min/{1.73_m2} (ref >59)
GFR calc non Af Amer: 79 mL/min/{1.73_m2} (ref >59)
Glucose: 175 mg/dL — ABNORMAL HIGH (ref 65–99)
Potassium: 3.9 mmol/L (ref 3.5–5.2)
Sodium: 139 mmol/L (ref 134–144)

## 2017-06-18 LAB — HEPATIC FUNCTION PANEL
ALT: 44 IU/L — ABNORMAL HIGH (ref 0–32)
AST: 56 IU/L — ABNORMAL HIGH (ref 0–40)
Albumin: 4.3 g/dL (ref 3.5–4.8)
Alkaline Phosphatase: 91 IU/L (ref 39–117)
Bilirubin Total: 0.5 mg/dL (ref 0.0–1.2)
Bilirubin, Direct: 0.2 mg/dL (ref 0.00–0.40)
Total Protein: 7.1 g/dL (ref 6.0–8.5)

## 2017-06-18 LAB — LIPID PANEL
Chol/HDL Ratio: 6.8 ratio — ABNORMAL HIGH (ref 0.0–4.4)
Cholesterol, Total: 232 mg/dL — ABNORMAL HIGH (ref 100–199)
HDL: 34 mg/dL — ABNORMAL LOW (ref >39)
LDL Calculated: 171 mg/dL — ABNORMAL HIGH (ref 0–99)
Triglycerides: 134 mg/dL (ref 0–149)
VLDL Cholesterol Cal: 27 mg/dL (ref 5–40)

## 2017-06-18 LAB — VITAMIN D 25 HYDROXY (VIT D DEFICIENCY, FRACTURES): Vit D, 25-Hydroxy: 34.5 ng/mL (ref 30.0–100.0)

## 2017-06-18 MED ORDER — CEFDINIR 300 MG PO CAPS: 300.0000 mg | ORAL_CAPSULE | Freq: Two times a day (BID) | ORAL | 0 refills | Status: DC

## 2017-06-18 NOTE — Telephone Encounter (Signed)
Pt aware.

## 2017-06-18 NOTE — Telephone Encounter (Signed)
What symptoms do you have? Stopped up, running a fever  How long have you been sick? Since last Friday  Have you been seen for this problem? Yes yesterday by Dr. Warrick Parisian  If your provider decides to give you a prescription, which pharmacy would you like for it to be sent to? [facility]   Patient informed that this information will be sent to the clinical staff for review and that they should receive a follow up call.

## 2017-06-19 ENCOUNTER — Ambulatory Visit: Payer: PPO | Admitting: Family Medicine

## 2017-06-24 ENCOUNTER — Ambulatory Visit: Payer: Self-pay | Admitting: Pharmacist

## 2017-06-24 ENCOUNTER — Other Ambulatory Visit: Payer: Self-pay | Admitting: Pharmacist

## 2017-06-24 ENCOUNTER — Other Ambulatory Visit: Payer: Self-pay | Admitting: *Deleted

## 2017-06-24 DIAGNOSIS — R109 Unspecified abdominal pain: Secondary | ICD-10-CM

## 2017-06-24 DIAGNOSIS — R945 Abnormal results of liver function studies: Principal | ICD-10-CM

## 2017-06-24 DIAGNOSIS — R7989 Other specified abnormal findings of blood chemistry: Secondary | ICD-10-CM

## 2017-06-24 NOTE — Patient Outreach (Signed)
[facility]) Care Management  06/24/2017  Nashika Coker Seres [birth year] [id number]  9:00AM Unsuccessful telephone call to Ms. Sisley this morning.  She was not at home per a man that answered her home phone.  I left a HIPAA compliant message to return my call this afternoon.  Patient did not answer her mobile number and no voicemail set up to leave a message.   Plan: I will try calling patient again later this afternoon.   10:58AM Incoming call received from Ms. Casalino. HIPAA identifiers verified. Ms. Bayless received the mailed PAP application, has completed her portion, has had the provider complete his portion, and is mailing it back to [facility] today.   Plan: I will await for completed application to arrive in the mail.  I will update Ms. Littleton when we receive it and mail to to [employer].   Ralene Bathe, PharmD, [place] [phone]

## 2017-06-26 ENCOUNTER — Other Ambulatory Visit: Payer: Self-pay | Admitting: Pharmacist

## 2017-06-26 NOTE — Patient Outreach (Signed)
[facility]) Care Management  06/26/2017  [place] 24-Mar-1941 [id number]   Incoming call and voicemail from Ms. Fawley regarding her PAP application.   Successful return call to Ms. Trindade. HIPAA identifiers verified. Ms. Elsey reports that she mailed the PAP application on Tuesday with one day shipping and requiring signature on delivery.  I verified with Doreene Burke, Pharmacy Technician, that the application has not yet arrived at [facility] office.  It likely arrived at the [facility] mail processing center and should arrive to our office early next week.    Plan: I will call Ms. Kempton as soon as her PAP arrives to [facility] office and is mailed to [employer].   Ralene Bathe, PharmD, [place] [phone]

## 2017-06-29 ENCOUNTER — Other Ambulatory Visit: Payer: Self-pay | Admitting: Pharmacist

## 2017-06-29 ENCOUNTER — Ambulatory Visit: Payer: Self-pay | Admitting: Pharmacist

## 2017-06-29 NOTE — Patient Outreach (Signed)
[place] [facility]) Care Management  06/29/2017  Inna Tisdell Cobern 11-15-1940 [id number]  Patient Assistance:  Mailed PAP application from patient received at [facility] office.  Application still missing annual gross income and number of household members.      9:45AM Unsuccessful outreach call to Ms. Crickenberger to inform her that I have received application.  I left a message at her house with female who answered the phone.  I also tried calling her mobile phone number but no voicemail available to leave a message.  I will try calling patient later today.   11:02AM Incoming call received from Ms. Dobberstein. HIPAA identifiers verified. I informed Ms. Rampy that I received her application and reviewed missing information.  Ms. Koury provided estimated annual gross income and household of 2.  I reviewed application process with Ms. Zuba specifically regarding attestation letter that will be sent to her from [employer] that she must sign and return (includling original application).    Plan: I will mail PAP application to [employer] today.  I will follow-up with patient in 2 weeks to see if she has received attestation letter.    Ralene Bathe, PharmD, [place] [phone]

## 2017-06-30 ENCOUNTER — Other Ambulatory Visit (HOSPITAL_COMMUNITY): Payer: Self-pay | Admitting: Interventional Radiology

## 2017-06-30 DIAGNOSIS — I639 Cerebral infarction, unspecified: Secondary | ICD-10-CM

## 2017-06-30 DIAGNOSIS — R42 Dizziness and giddiness: Secondary | ICD-10-CM

## 2017-06-30 DIAGNOSIS — I729 Aneurysm of unspecified site: Secondary | ICD-10-CM

## 2017-07-13 ENCOUNTER — Ambulatory Visit (HOSPITAL_COMMUNITY)
Admission: RE | Admit: 2017-07-13 | Discharge: 2017-07-13 | Disposition: A | Discharge status: Home / Self Care | Payer: PPO | Source: Ambulatory Visit | Attending: Family Medicine | Admitting: Family Medicine

## 2017-07-13 ENCOUNTER — Telehealth: Payer: Self-pay | Admitting: Family Medicine

## 2017-07-13 ENCOUNTER — Other Ambulatory Visit: Payer: Self-pay | Admitting: Pharmacist

## 2017-07-13 DIAGNOSIS — K439 Ventral hernia without obstruction or gangrene: Secondary | ICD-10-CM | POA: Diagnosis not present

## 2017-07-13 DIAGNOSIS — K5730 Diverticulosis of large intestine without perforation or abscess without bleeding: Secondary | ICD-10-CM | POA: Insufficient documentation

## 2017-07-13 DIAGNOSIS — I70208 Unspecified atherosclerosis of native arteries of extremities, other extremity: Secondary | ICD-10-CM | POA: Insufficient documentation

## 2017-07-13 DIAGNOSIS — I700 Atherosclerosis of aorta: Secondary | ICD-10-CM | POA: Diagnosis not present

## 2017-07-13 DIAGNOSIS — K449 Diaphragmatic hernia without obstruction or gangrene: Secondary | ICD-10-CM | POA: Diagnosis not present

## 2017-07-13 DIAGNOSIS — R109 Unspecified abdominal pain: Secondary | ICD-10-CM | POA: Insufficient documentation

## 2017-07-13 DIAGNOSIS — N281 Cyst of kidney, acquired: Secondary | ICD-10-CM | POA: Insufficient documentation

## 2017-07-13 DIAGNOSIS — R7989 Other specified abnormal findings of blood chemistry: Secondary | ICD-10-CM

## 2017-07-13 DIAGNOSIS — R945 Abnormal results of liver function studies: Secondary | ICD-10-CM

## 2017-07-13 NOTE — Patient Outreach (Signed)
[facility]) Care Management  07/13/2017  Fiorela Pelzer Jeremiah [birth year] [id number]  8:46AM Unsuccessful telephone call to Ms. Hyer today.  I left a HIPPA compliant voicemail on the home phone.  There was not a voicemail available on the mobile phone.    8:48AM Successful call placed to [employer] patient assistance program.  Per representative, [employer] mailed patient the attestation letter on 07/07/2017.  Patient needs to sign attestation letter AND include DOB on 1st page of application.  Once application received back by [employer], she will be approved for a 90 day supply.  She will then need to reapply for 2019.    Plan: I will follow-up with Ms. Olden tomorrow regarding patient assistance if I have not heard back beforehand.     Addendum:  9:32AM Incoming call from Ms. Cowley.  HIPAA identifiers verified. Ms. Dosch received the letter from [employer] on Friday and will return it today after completing it.  She voiced understanding about re-applying in December for 2019.  Plan: I will follow-up with patient in 2 weeks to ensure she has received her medication.    Ralene Bathe, PharmD, [place] [phone]

## 2017-07-13 NOTE — Telephone Encounter (Signed)
What symptoms do you have? Has diarrehea and nausea because she is taking Metformin twice a day. She wants it noted in her chart. Don't want anything called in.  How long have you been sick? Two weeks  Have you been seen for this problem? NO  If your provider decides to give you a prescription, which pharmacy would you like for it to be sent to?    Patient informed that this information will be sent to the clinical staff for review and that they should receive a follow up call.

## 2017-07-17 ENCOUNTER — Other Ambulatory Visit: Payer: Self-pay

## 2017-07-17 ENCOUNTER — Ambulatory Visit: Payer: PPO | Admitting: Family Medicine

## 2017-07-17 ENCOUNTER — Encounter: Payer: Self-pay | Admitting: Family Medicine

## 2017-07-17 VITALS — BP 142/78 | HR 88 | Temp 97.6°F | Resp 18 | Ht 62.0 in | Wt 180.1 lb

## 2017-07-17 DIAGNOSIS — E7849 Other hyperlipidemia: Secondary | ICD-10-CM

## 2017-07-17 DIAGNOSIS — Z23 Encounter for immunization: Secondary | ICD-10-CM

## 2017-07-17 DIAGNOSIS — E1142 Type 2 diabetes mellitus with diabetic polyneuropathy: Secondary | ICD-10-CM

## 2017-07-17 DIAGNOSIS — I10 Essential (primary) hypertension: Secondary | ICD-10-CM

## 2017-07-17 DIAGNOSIS — K219 Gastro-esophageal reflux disease without esophagitis: Secondary | ICD-10-CM

## 2017-07-17 NOTE — Progress Notes (Signed)
Chief Complaint  Patient presents with  . 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  She has been very happy with the medical care she has been provided by Dr. Laurance Flatten for the last many years, but he is going to retire soon and she wants to have a primary care doctor to go to after he leaves.  She predicts that she will come see me starting in January of next year.  She is up-to-date with her health screenings but is offered a flu shot today. She has type 2 diabetes.  She is well controlled.  Her last hemoglobin A1c was 6.6.  She states she gets yearly eye exams.  She is on an ARB for hypertension.  She denies any kidney involvement.  She denies any neuropathy.  She has not felt the need to see podiatry, is capable with her own foot care. She has hypothyroidism on thyroid replacement. Hypertension is well controlled. She has hyperlipidemia that is not controlled.  She has multiple perceived drug allergies, and states she cannot take any of the statin medications.  Her last LDL was elevated at 171.  She understands that this puts her at increased risk of cardiovascular disease heart attack and stroke.  She is not taking fish oil.  She does try to watch the cholesterol in her diet.  She feels unable to exercise because of orthopedic limitations. I have discussed the multiple health risks associated with cigarette smoking including, but not limited to, cardiovascular disease, lung disease and cancer.  I have strongly recommended that smoking be stopped.  I have reviewed the various methods of quitting including cold [place], classes, nicotine replacements and prescription medications.  I have offered assistance in this difficult process.  The patient is not interested in assistance at this time.  She feels that she has failed nicotine replacement, is allergic to Chantix.  I told her that we could think about Wellbutrin.  We will discuss this at future  visits. She is on Lexapro for depression and anxiety.  She feels like this is helpful for her.  She is also takes Xanax.  I explained to her that I would not be giving her Xanax on a regular or daily basis.  I find it useful for panic attacks, but do not support daily use for fear of benzodiazepine dependence disorders.   Patient Active Problem List   Diagnosis Date Noted  . Type 2 diabetes mellitus with hyperlipidemia ([place]) 09/06/2015  . Type 2 diabetes mellitus with peripheral neuropathy ([place]) 11/27/2014  . CVA (cerebral vascular accident) ([place]) 02/27/2012  . Cerebral aneurysm 02/27/2012  . 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 Gait instability 02/27/2012  . Vision disturbance following cerebrovascular accident 02/27/2012  . Dyspnea 11/14/2011  . Thyroid disease 10/12/2008  . Diabetes ([place]) 08/29/2008  . Hyperlipidemia 08/29/2008  . OBESITY, UNSPECIFIED 08/29/2008  . COPD (chronic obstructive pulmonary disease) ([place]) 08/29/2008  . WEIGHT LOSS 08/29/2008  . DIARRHEA 08/29/2008  . HTN (hypertension) 08/28/2008  . ASTHMA 08/28/2008  . ESOPHAGEAL STRICTURE 08/28/2008  . GERD 08/28/2008  . HIATAL HERNIA 08/28/2008  . DIVERTICULOSIS, COLON 08/28/2008  . COLONIC POLYPS, HX OF 08/28/2008    Outpatient Encounter Medications as of 07/17/2017  Medication Sig  . ALPRAZolam (XANAX) 0.25 MG tablet TAKE ONE TABLET BY MOUTH TWICE DAILY (Patient taking differently: TAKE ONE TABLET BY MOUTH once daily)  . amLODipine (NORVASC) 10 MG tablet TAKE 1 TABLET  BY MOUTH EVERY DAY  . aspirin 325 MG EC tablet Take 325 mg by mouth daily.  Marland Kitchen CINNAMON PO Take 2,000 mg by mouth daily.  . clopidogrel (PLAVIX) 75 MG tablet TAKE ONE TABLET BY MOUTH DAILY (PLEASE USE AUROBINDO BRAND, PT HD FOR ALLERGY TO REDDY)  . escitalopram (LEXAPRO) 10 MG tablet TAKE 1 TABLET BY MOUTH EVERY DAY  . glucose blood test strip Check BS BID and PRN. DX E11.9  . hydrochlorothiazide (MICROZIDE) 12.5 MG capsule TAKE 1 CAPSULE BY  MOUTH EVERY DAY  . metFORMIN (GLUCOPHAGE) 1000 MG tablet TAKE 1 TABLET BY MOUTH EVERY MORNING WITH BREAKFAST and 1/2 tablet in evening with food (Patient taking differently: 1,000 mg 2 (two) times daily with a meal. TAKE 1 TABLET BY MOUTH EVERY MORNING WITH BREAKFAST and 1/2 tablet in evening with food)  . metoCLOPramide (REGLAN) 10 MG tablet TAKE ONE TABLET BY MOUTH 1 TIME DAILY AS NEEDED  . montelukast (SINGULAIR) 10 MG tablet Take 1 tablet (10 mg total) by mouth at bedtime.  . pantoprazole (PROTONIX) 40 MG tablet TAKE 1 TABLET BY MOUTH EVERY DAY --FILL ONLY WHEN PATIENT REQUESTS - EMERGENCY REFILL FAXED DR  . VENTOLIN HFA 108 (90 Base) MCG/ACT inhaler INHALE 2 PUFFS EVERY SIX HOURS AS NEEDED FOR WHEEZING OR SHORTNESS OF BREATH  . Vitamin D, Ergocalciferol, (DRISDOL) 50000 units CAPS capsule TAKE 1 CAPSULE BY MOUTH EVERY 7 DAYS.  Marland Kitchen JANUVIA 50 MG tablet TAKE ONE TABLET BY MOUTH DAILY (Patient not taking: Reported on 07/17/2017)   No facility-administered encounter medications on file as of 07/17/2017.     Allergies  Allergen Reactions  . Banana Anaphylaxis  . Stevia [Stevioside] Swelling    Face, tongue, and eye swelling   . Actos [Pioglitazone] Swelling  . Aloe Vera     swelling  . Benicar [Olmesartan Medoxomil] Other (See Comments)    Feel bad  . Chantix [Varenicline] Other (See Comments)    "Worked opposite."  . Codeine Nausea And Vomiting  . Diovan [Valsartan] Other (See Comments)    Feel bad  . Latex     REACTION: red rash  . Lipitor [Atorvastatin] Other (See Comments)    REACTION:  Joint pain  . Spinach     Stomach problems  . Vicodin [Hydrocodone-Acetaminophen] Nausea And Vomiting  . Zocor [Simvastatin] Other (See Comments)    REACTION:  "made liver function incorrectly"  . Ace Inhibitors Cough  . Celebrex [Celecoxib] Rash  . Penicillins Rash  . Sulfonamide Derivatives Rash    Review of Systems  Constitutional: Positive for fatigue. Negative for activity change,  appetite change and unexpected weight change.  HENT: Negative for congestion, dental problem, postnasal drip and rhinorrhea.   Eyes: Negative for redness and visual disturbance.  Respiratory: Negative for cough and shortness of breath.   Cardiovascular: Negative for chest pain, palpitations and leg swelling.  Gastrointestinal: Positive for abdominal pain. Negative for constipation and diarrhea.  Genitourinary: Negative for difficulty urinating and frequency.  Musculoskeletal: Positive for back pain and gait problem. Negative for arthralgias.  Neurological: Negative for dizziness and headaches.  Psychiatric/Behavioral: Negative for dysphoric mood and sleep disturbance. The patient is nervous/anxious.     BP (!) 142/78   Pulse 88   Temp 97.6 F (36.4 C) (Temporal)   Resp 18   Ht 5\' 2"  (1.575 m)   Wt 180 lb 1.9 oz (81.7 kg)   SpO2 97%   BMI 32.94 kg/m   Physical Exam  Constitutional: She appears well-developed and  well-nourished.  Overweight.  Smells of tobacco  HENT:  Head: Normocephalic and atraumatic.  Mouth/Throat: Oropharynx is clear and moist.  Eyes: Conjunctivae are normal. Pupils are equal, round, and reactive to light.  Neck: Normal range of motion. Neck supple. No thyromegaly present.  Cardiovascular: Normal rate, regular rhythm and normal heart sounds.  Soft systolic murmur  Pulmonary/Chest: Effort normal. No respiratory distress.  Decreased BS  Abdominal: Soft. Bowel sounds are normal.  Musculoskeletal: Normal range of motion. She exhibits no edema.  Lymphadenopathy:    She has no cervical adenopathy.  Neurological: She is alert.  Gait normal  Skin: Skin is warm and dry.  Psychiatric: She has a normal mood and affect. Her behavior is normal. Thought content normal.  talkative  Nursing note and vitals reviewed.   ASSESSMENT/PLAN:  1. Essential hypertension  2. Type 2 diabetes mellitus with peripheral neuropathy (HCC) - CBC - COMPLETE METABOLIC PANEL WITH  GFR - Hemoglobin A1c - Lipid panel - VITAMIN D 25 Hydroxy (Vit-D Deficiency, Fractures) - Urinalysis, Routine w reflex microscopic  3. Other hyperlipidemia  4. Gastroesophageal reflux disease, esophagitis presence not specified  5. Needs flu shot - Flu Vaccine QUAD 36+ mos IM   Patient Instructions  No change in medicines Due for flu shot today See me in 3 months Call sooner for problems.    Raylene Everts, MD

## 2017-07-17 NOTE — Patient Instructions (Signed)
No change in medicines Due for flu shot today See me in 3 months Call sooner for problems.

## 2017-07-21 ENCOUNTER — Other Ambulatory Visit: Payer: Self-pay | Admitting: Pharmacy Technician

## 2017-07-21 ENCOUNTER — Other Ambulatory Visit: Payer: Self-pay | Admitting: Pharmacist

## 2017-07-21 NOTE — Patient Outreach (Signed)
[facility]) Care Management  07/21/2017  Iriel Nason Lovick 12-23-1940 [id number]   Successful outreach call to [employer] patient assistance program. The attestation along with the application was mailed to the patient on 10/29. They have not received the documentation from the patient to approve her for Januvia. Colleen, Rph is going to follow-up with the patient to inform her of the current status.  Doreene Burke, [place] [phone]

## 2017-07-21 NOTE — Patient Outreach (Signed)
[facility]) Care Management  07/21/2017  Jlyn Cerros Garbers 1941/05/11 [id number]   10:44AM Incoming call received from Ms. Rallo to inquire about the status of her Scientist, clinical (histocompatibility and immunogenetics).  Ms. Limburg reports she mailed the attestation letter and application back to the company on 07/13/2017.   Call placed to [employer] regarding update on application.  [employer] has not received the application yet.    11:03AM Call placed to Ms. Schooler's home and mobile phone numbers.  I was unable to reach patient but will try to call her again later this afternoon to give her the update.   Ralene Bathe, PharmD, [place] [phone]   11:57AM Incoming call received from Ms.Cuellar.  I relayed message that [employer] had not received her returned attestation letter with application yet.  Ms. Halabi voiced understanding.  We will contact [employer] again on Friday regarding application status.   Ralene Bathe, PharmD, [place] [phone]

## 2017-07-22 ENCOUNTER — Other Ambulatory Visit: Payer: Self-pay | Admitting: *Deleted

## 2017-07-22 MED ORDER — ESCITALOPRAM OXALATE 10 MG PO TABS: 10.0000 mg | ORAL_TABLET | Freq: Every day | ORAL | 0 refills | Status: DC

## 2017-07-22 MED ORDER — AMLODIPINE BESYLATE 10 MG PO TABS: 10.0000 mg | ORAL_TABLET | Freq: Every day | ORAL | 1 refills | Status: DC

## 2017-07-24 ENCOUNTER — Other Ambulatory Visit: Payer: Self-pay | Admitting: Pharmacy Technician

## 2017-07-24 NOTE — Patient Outreach (Signed)
[place] [facility]) Care Management  07/24/2017  [place] [birth year] [id number]  Successful outreach call to [employer] patient assistance program. They received the application and attestation from the patient but she forgot to update her DOB which was requested in the letter that was sent to her.   I called the patient to inform her that her application has not been approved and they mailed it back to her on 11/14. HIPAA identifiers verified and verbal consent received. I informed her that she had the option to mail it back once received and she would be approved through the end of 2018 or if she waits and mails it back after 12/5 they will approve her for the year of 2019. The patient has decided to wait and mail it back next month so she doesn't have to go through the process again for next year.   PLAN:  I will contact the patient on 12/10 to make sure that she mailed the application back to [employer].  Doreene Burke, [place] [phone]

## 2017-07-27 ENCOUNTER — Ambulatory Visit: Payer: Self-pay | Admitting: Pharmacist

## 2017-08-03 ENCOUNTER — Telehealth: Payer: Self-pay | Admitting: Family Medicine

## 2017-08-03 MED ORDER — METOCLOPRAMIDE HCL 10 MG PO TABS: ORAL_TABLET | ORAL | 5 refills | Status: DC

## 2017-08-03 NOTE — Telephone Encounter (Signed)
What is the name of the medication? metoCLOPramide (REGLAN) 10 MG tablet  Have you contacted your pharmacy to request a refill? yes  Which pharmacy would you like this sent to? [facility]    Patient notified that their request is being sent to the clinical staff for review and that they should receive a call once it is complete. If they do not receive a call within 24 hours they can check with their pharmacy or our office.   WANTS # [id number]

## 2017-08-04 ENCOUNTER — Telehealth (HOSPITAL_COMMUNITY): Payer: Self-pay | Admitting: Radiology

## 2017-08-04 NOTE — Telephone Encounter (Signed)
Pt called and stated that she has been treated for a sinus infection and no longer has any dizziness or headaches. She was scheduled for a cerebral angiogram on 08/11/17. She wants to put that off for now and will call us back to reschedule when she is ready. JM

## 2017-08-07 ENCOUNTER — Other Ambulatory Visit: Payer: Self-pay | Admitting: Pharmacy Technician

## 2017-08-07 NOTE — Patient Outreach (Signed)
[facility]) Care Management  08/07/2017  Lexy Meininger Mark 1941-03-22 [id number]  Successful patient outreach to Mrs. Nickle in reference to patient assistance application for Januvia. HIPAA identifiers verified and verbal consent received. I called patient to remind her to mail her [employer] application at the end of next week so that it could be approved for 2019. She informed me that [employer] contacted her and asked her to mail it back and they would mail a new application to her for next year. The patient had been notified that she was approved but did not know when she would receive the medication.  I placed a call to [facility] on her behalf and they just received the order for Januvia yesterday. The patient should receive a 90 day supply in 7-10 business days.  Mrs. Bleiler states she is going to fill out the new application and take the provider portion to her physician for completion. She will mail the new application to [employer] for approval for next year. I ensured that she has contact information for [facility] and I in case she needs our assistance.   PLAN:  I will follow-up with patient in 7-10 business days to check the status of delivery of medication.  Doreene Burke, [place] [phone]

## 2017-08-10 ENCOUNTER — Telehealth: Payer: Self-pay | Admitting: Pharmacist

## 2017-08-10 NOTE — Patient Outreach (Signed)
[facility]) Care Management  08/10/2017  Amarianna Abplanalp Goldfarb 09-29-1940 [id number]  Incoming call received from Ms. Jacome.  HIPAA identifiers verified. Ms. Lamarche reports she is filling out an application for sitagliptin for 2019 and would like to know her annual income used for the 4932 application.  Information provided to Ms. Reek ($32,400).  No further questions at this time.    Ralene Bathe, PharmD, [place] [phone]

## 2017-08-11 ENCOUNTER — Encounter (HOSPITAL_COMMUNITY): Payer: Self-pay

## 2017-08-11 ENCOUNTER — Ambulatory Visit (HOSPITAL_COMMUNITY): Admission: RE | Admit: 2017-08-11 | Payer: PPO | Source: Ambulatory Visit

## 2017-08-17 ENCOUNTER — Other Ambulatory Visit: Payer: PPO

## 2017-08-17 ENCOUNTER — Ambulatory Visit: Payer: Self-pay | Admitting: Pharmacy Technician

## 2017-08-21 ENCOUNTER — Other Ambulatory Visit: Payer: Self-pay | Admitting: Pharmacy Technician

## 2017-08-21 NOTE — Patient Outreach (Signed)
[place] [facility]) Care Management  08/21/2017  Elizabeth [name] 03-23-1941 [id number]  Successful patient outreach call in reference to patient assistance for Januvia. HIPAA identifiers verified and verbal consent received. Patient was approved through [employer] patient assistance program on 08/07/2017. She has not received the delivery of the medication as of yet. I contacted [facility] to inquire if and when the medication was shipped. I was informed by the representative that the order had not been processed by the pharmacy. It was processed while I was on the phone and should be received by the patient 08/24/17. I have informed the patient of the current status and will follow-up with her next week the ensure that she has received her medication.  Doreene Burke, [place] [phone]

## 2017-08-25 ENCOUNTER — Telehealth: Payer: Self-pay | Admitting: Pharmacist

## 2017-08-25 NOTE — Patient Outreach (Signed)
[facility]) Care Management  08/25/2017  [place] [birth year] [id number]   Incoming call received from Ms. Roepke. HIPAA identifiers verified. 90 day supply of Januvia delivered today through [employer] PAP.  Patient has already mailed in application for [place] for 2019.  She requests that [facility] follow-up with her to assist if necessary with this application.   Plan: I will call patient in 2 weeks regarding [phone] Januvia application.   Ralene Bathe, PharmD, [place] [phone]

## 2017-08-26 ENCOUNTER — Ambulatory Visit: Payer: Self-pay | Admitting: Pharmacy Technician

## 2017-09-07 ENCOUNTER — Other Ambulatory Visit: Payer: Self-pay | Admitting: Pharmacist

## 2017-09-07 NOTE — Patient Outreach (Addendum)
[place] [facility]) Care Management  09/07/2017  Arnette Driggs Grimmer [birth year] [id number]  Incoming call and voicemail received from Ms. Gorney.  I returned Ms. Bunch's call today but was unable to reach her.  I left a HIPAA compliant voicemail on her mobile and a message with a man who answered her home phone.    Plan: I will follow-up with Ms. Arington on Wednesday, January 2nd, 2019.   Ralene Bathe, PharmD, [place] [phone]    Addendum: Incoming call received from Ms. Nabers. HIPAA identifiers verified. Ms. Zender reports that she mailed her Januvia PAP application to [employer].  She received it back from [employer] as incomplete as the doctor had not signed one page.  She reports she will bring it back to the office later this week for the signature and will mail application back to [employer].  She is aware to watch for attestation letter from [place] which will need to be signed and resubmitted.    Plan: Follow-up with Ms. Reist in 2-3 weeks.   Ralene Bathe, PharmD, [place] [phone]

## 2017-09-08 ENCOUNTER — Other Ambulatory Visit: Payer: Self-pay | Admitting: Family Medicine

## 2017-09-09 ENCOUNTER — Ambulatory Visit: Payer: Self-pay | Admitting: Pharmacist

## 2017-09-17 ENCOUNTER — Other Ambulatory Visit: Payer: PPO

## 2017-09-17 DIAGNOSIS — R350 Frequency of micturition: Secondary | ICD-10-CM | POA: Diagnosis not present

## 2017-09-18 ENCOUNTER — Encounter: Payer: Self-pay | Admitting: Cardiology

## 2017-09-18 ENCOUNTER — Ambulatory Visit: Payer: PPO | Admitting: Cardiology

## 2017-09-18 VITALS — BP 116/68 | HR 84 | Ht 62.0 in | Wt 181.0 lb

## 2017-09-18 DIAGNOSIS — I10 Essential (primary) hypertension: Secondary | ICD-10-CM | POA: Diagnosis not present

## 2017-09-18 DIAGNOSIS — E7849 Other hyperlipidemia: Secondary | ICD-10-CM

## 2017-09-18 DIAGNOSIS — I2510 Atherosclerotic heart disease of native coronary artery without angina pectoris: Secondary | ICD-10-CM

## 2017-09-18 NOTE — Progress Notes (Signed)
Clinical Summary Elizabeth [name] is a 77 y.o.female seen today for follow up of the following medical problems   1. 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 No SOB/DOE - compliant with meds. Of note she is on plavix for prior stroke by neurologist.   2. HTN - not on ACE or ARB due to allergy - 120s/70s at home   3. Hyperlipidemia - intolerant to statins.  - zetia did not lower cholesterol per her report so she stopped taking.    Past Medical History:  Diagnosis Date  . Active smoker   . Allergy   . Anxiety    Takes Xanax for anxiety  . Arthritis   . Asthma   . Cataract   . Chronic airway obstruction, not elsewhere classified   . Clotting disorder ([place])   . Depression   . Diarrhea   . Diverticulosis of colon (without mention of hemorrhage)   . Esophageal reflux   . Family history of malignant neoplasm of gastrointestinal tract   . Headache(784.0)    Irregular  . Neuromuscular disorder ([place])   . Obesity, unspecified   . Other and unspecified hyperlipidemia   . Personal history of colonic polyps   . Stricture and stenosis of esophagus   . Stroke ([place])   . Thyroid disease   . Type II or unspecified type diabetes mellitus without mention of complication, not stated as uncontrolled   . Unspecified asthma(493.90)   . Unspecified essential hypertension      Allergies  Allergen Reactions  . Banana Anaphylaxis  . Stevia [Stevioside] Swelling    Face, tongue, and eye swelling   . Actos [Pioglitazone] Swelling  . Aloe Vera     swelling  . Benicar [Olmesartan Medoxomil] Other (See Comments)    Feel bad  . Chantix [Varenicline] Other (See Comments)    "Worked opposite."  . Codeine Nausea And Vomiting  . Diovan [Valsartan] Other (See Comments)    Feel bad  . Latex     REACTION: red rash  . Lipitor [Atorvastatin] Other (See Comments)    REACTION:  Joint pain  . Spinach     Stomach problems  .  Vicodin [Hydrocodone-Acetaminophen] Nausea And Vomiting  . Zocor [Simvastatin] Other (See Comments)    REACTION:  "made liver function incorrectly"  . Ace Inhibitors Cough  . Celebrex [Celecoxib] Rash  . Penicillins Rash  . Sulfonamide Derivatives Rash     Current Outpatient Medications  Medication Sig Dispense Refill  . ALPRAZolam (XANAX) 0.25 MG tablet TAKE ONE TABLET BY MOUTH TWICE DAILY AS NEEDED 60 tablet 0  . amLODipine (NORVASC) 10 MG tablet Take 1 tablet (10 mg total) daily by mouth. 90 tablet 1  . aspirin 325 MG EC tablet Take 325 mg by mouth daily.    Marland Kitchen CINNAMON PO Take 2,000 mg by mouth daily.    . clopidogrel (PLAVIX) 75 MG tablet TAKE ONE TABLET BY MOUTH DAILY (PLEASE USE AUROBINDO BRAND, PT HD FOR ALLERGY TO REDDY) 90 tablet 1  . escitalopram (LEXAPRO) 10 MG tablet Take 1 tablet (10 mg total) daily by mouth. 90 tablet 0  . glucose blood test strip Check BS BID and PRN. DX E11.9 100 each 11  . hydrochlorothiazide (MICROZIDE) 12.5 MG capsule TAKE 1 CAPSULE BY MOUTH EVERY DAY 90 capsule 1  . JANUVIA 50 MG tablet TAKE ONE TABLET BY MOUTH DAILY (Patient not taking: Reported on 07/17/2017) 30 tablet  2  . metFORMIN (GLUCOPHAGE) 1000 MG tablet TAKE 1 TABLET BY MOUTH EVERY MORNING WITH BREAKFAST and 1/2 tablet in evening with food (Patient taking differently: 1,000 mg 2 (two) times daily with a meal. TAKE 1 TABLET BY MOUTH EVERY MORNING WITH BREAKFAST and 1/2 tablet in evening with food) 135 tablet 1  . metoCLOPramide (REGLAN) 10 MG tablet TAKE ONE TABLET BY MOUTH 1 TIME DAILY AS NEEDED 30 tablet 5  . montelukast (SINGULAIR) 10 MG tablet Take 1 tablet (10 mg total) by mouth at bedtime. 90 tablet 4  . pantoprazole (PROTONIX) 40 MG tablet TAKE 1 TABLET BY MOUTH EVERY DAY --FILL ONLY WHEN PATIENT REQUESTS - EMERGENCY REFILL FAXED DR 90 tablet 0  . VENTOLIN HFA 108 (90 Base) MCG/ACT inhaler INHALE 2 PUFFS EVERY SIX HOURS AS NEEDED FOR WHEEZING OR SHORTNESS OF BREATH 18 g 2  . Vitamin D,  Ergocalciferol, (DRISDOL) 50000 units CAPS capsule TAKE 1 CAPSULE BY MOUTH EVERY 7 DAYS. 12 capsule 0   No current facility-administered medications for this visit.      Past Surgical History:  Procedure Laterality Date  . APPENDECTOMY    . BACK SURGERY     Spinal surgery  . CARDIAC CATHETERIZATION  2006   LAD: 30%, RCA : 20%, normal EF, elevated LVEDP  . CARDIAC CATHETERIZATION    . CEREBRAL ANGIOGRAM  12/20/2015  . KNEE ARTHROSCOPY     left  . LUMBAR DISC SURGERY    . TONSILLECTOMY    . TOTAL ABDOMINAL HYSTERECTOMY     menopause/bleeding     Allergies  Allergen Reactions  . Banana Anaphylaxis  . Stevia [Stevioside] Swelling    Face, tongue, and eye swelling   . Actos [Pioglitazone] Swelling  . Aloe Vera     swelling  . Benicar [Olmesartan Medoxomil] Other (See Comments)    Feel bad  . Chantix [Varenicline] Other (See Comments)    "Worked opposite."  . Codeine Nausea And Vomiting  . Diovan [Valsartan] Other (See Comments)    Feel bad  . Latex     REACTION: red rash  . Lipitor [Atorvastatin] Other (See Comments)    REACTION:  Joint pain  . Spinach     Stomach problems  . Vicodin [Hydrocodone-Acetaminophen] Nausea And Vomiting  . Zocor [Simvastatin] Other (See Comments)    REACTION:  "made liver function incorrectly"  . Ace Inhibitors Cough  . Celebrex [Celecoxib] Rash  . Penicillins Rash  . Sulfonamide Derivatives Rash      Family History  Problem Relation Age of Onset  . Diabetes Mother   . Heart disease Mother   . Asthma Mother   . Kidney disease Mother   . Drug abuse Mother   . Hypertension Mother   . Mental illness Mother        ?sociopath/psychopath  . Early death Father        trauma  . Colon cancer Other        first cousin, paternal aunt and uncle  . Cancer Paternal Grandmother        gastric  . Breast cancer Maternal Aunt        several paternal cousins  . Heart disease Maternal Grandmother   . Thyroid cancer Other        aunt  .  Thyroid disease Sister        uncertain type-not cancer  . Heart disease Sister   . Heart disease Brother   . Cancer Sister  pancreatic     Social History [name] reports that she has been smoking cigarettes.  She started smoking about 34 years ago. She has a 20.00 pack-year smoking history. she has never used smokeless tobacco. Ms. Tsou reports that she does not drink alcohol.   Review of Systems CONSTITUTIONAL: No weight loss, fever, chills, weakness or fatigue.  HEENT: Eyes: No visual loss, blurred vision, double vision or yellow sclerae.No hearing loss, sneezing, congestion, runny nose or sore throat.  SKIN: No rash or itching.  CARDIOVASCULAR: per hpi RESPIRATORY: No shortness of breath, cough or sputum.  GASTROINTESTINAL: No anorexia, nausea, vomiting or diarrhea. No abdominal pain or blood.  GENITOURINARY: No burning on urination, no polyuria NEUROLOGICAL: No headache, dizziness, syncope, paralysis, ataxia, numbness or tingling in the extremities. No change in bowel or bladder control.  MUSCULOSKELETAL: No muscle, back pain, joint pain or stiffness.  LYMPHATICS: No enlarged nodes. No history of splenectomy.  PSYCHIATRIC: No history of depression or anxiety.  ENDOCRINOLOGIC: No reports of sweating, cold or heat intolerance. No polyuria or polydipsia.  Marland Kitchen   Physical Examination Vitals:   09/18/17 1008  BP: 116/68  Pulse: 84  SpO2: 97%   Vitals:   09/18/17 1008  Weight: 181 lb (82.1 kg)  Height: 5\' 2"  (1.575 m)    Gen: resting comfortably, no acute distress HEENT: no scleral icterus, pupils equal round and reactive, no palptable cervical adenopathy,  CV: RRR, no m/r/g, no jvd Resp: Clear to auscultation bilaterally GI: abdomen is soft, non-tender, non-distended, normal bowel sounds, no hepatosplenomegaly MSK: extremities are warm, no edema.  Skin: warm, no rash Neuro:  no focal deficits Psych: appropriate affect   Diagnostic Studies  02/2012 Echo:  LVEF 60-65%, mild LVH, no WMAs, grade I diastolic dysfunction,   02/5783 Cath DATE OF PROCEDURE: 12/26/2004  DATE OF DISCHARGE: 12/26/2004  CARDIAC CATHETERIZATION  INDICATIONS: Ms. Cerny is a pleasant 77 year old woman with a history of  hypertension, dyslipidemia, type 2 diabetes mellitus, tobacco use, and  asthma. She has had recent problems with mild dyspnea and lower extremity  edema. Given her cardiac risk factor profile, she is referred for diagnostic  coronary angiography and left ventriculography to assess coronary anatomy  and left ventricular function.  PROCEDURE:  1. Left heart catheterization.  2. Selective coronary angiography.  3. Left ventriculography.  DESCRIPTION OF PROCEDURE: The area about the right femoral artery was  anesthetized 1% lidocaine and a 4-French sheath was placed in the right  femoral artery via modified Seldinger technique. Standard preformed 4-French  JL-4 and JR-4 catheters used for selective coronary angiography and angled  pigtail catheters used for left heart catheterization and left  ventriculography. All exchanges were made over wire. The patient tolerated  procedure well without immediate complications.  HEMODYNAMIC RESULTS:  1. Left ventricle 151/25 mmHg.  2. Aorta 152/76 mmHg.  ANGIOGRAPHIC FINDINGS:  1. Left main coronary artery is free of significant flow-limiting coronary  atherosclerosis.  2. The left anterior descending is a medium caliber vessel with three small  diagonal branches. There are minor Luminal irregularities noted  throughout this system with approximately 30% diffuse mid-vessel  stenosis. There is also a 30-40% ostial stenosis involving the first  diagonal branch. No flow-limiting stenoses are noted.  3. The circumflex coronary artery is a medium caliber vessel. There is a  very small branch in the AV groove and two larger bifurcating obtuse  marginal branches. Minor Luminal  irregularities are noted without flow-  limiting stenoses.  4. The  right coronary artery is a medium caliber vessel with posterior  descending branch. Minor Luminal irregularities are noted. There is a  20% stenosis in the proximal to mid-vessel.  LEFT VENTRICULOGRAPHY: Left ventriculography was performed in the RAO  projection revealing an ejection fraction approximately 65% in the setting  of ventricular ectopy without significant mitral regurgitation or focal wall  motion abnormality.  DIAGNOSES:  1. Mild coronary atherosclerosis as outlined with no flow-limiting stenoses  in the major epicardial vessels.  2. Left ventricular ejection fraction of approximately 65% in the setting  of ventricular ectopy. No significant mitral regurgitation is noted. The  left ventricular end-diastolic pressure is increased at 25 mmHg. At this  point would suspect an element of diastolic dysfunction particularly  given elevated left renal end-diastolic pressure.  The patient is already on an ACE inhibitor and diuretic. We will plan to  continue maximizing medical therapy. We will arrange a follow-up 2-D  echocardiogram for diastolic parameters and also to exclude  significantly elevated pulmonary pressures. She will have follow-up in  the office to review this and her progress.  07/26/13 Clinic EKG: sinus rhythm, normal axis, RBBB   Assessment and Plan  1. Non-obstructive CAD - no symptoms, continue current meds  2. HTN - she is at goal, continue current meds  3. Hyperlipidemia - intolerant to statins, we will refer to lipid clinic to consider pcsk9 inhibitors.       Arnoldo Lenis, M.D.

## 2017-09-18 NOTE — Patient Instructions (Addendum)
Medication Instructions:  Your physician recommends that you continue on your current medications as directed. Please refer to the Current Medication list given to you today.   Labwork: NONE   Testing/Procedures: NONE   Follow-Up: Your physician wants you to follow-up in: 6 Months with Dr. Harl Bowie.  You will receive a reminder letter in the mail two months in advance. If you don't receive a letter, please call our office to schedule the follow-up appointment.   Any Other Special Instructions Will Be Listed Below (If Applicable). You have been referred to Lipid clinic  Dr. Caren Macadam [phone]   If you need a refill on your cardiac medications before your next appointment, please call your pharmacy. Thank you for choosing [facility]!

## 2017-09-19 LAB — URINE CULTURE

## 2017-09-23 ENCOUNTER — Encounter: Payer: Self-pay | Admitting: Cardiology

## 2017-09-23 ENCOUNTER — Telehealth: Payer: Self-pay | Admitting: Pharmacist

## 2017-09-23 NOTE — Telephone Encounter (Signed)
Pt scheduled on 1/31 for lipid discussion.

## 2017-09-23 NOTE — Telephone Encounter (Signed)
LM with husband to call back to be scheduled in lipid clinic.

## 2017-09-24 ENCOUNTER — Telehealth: Payer: Self-pay | Admitting: Family Medicine

## 2017-09-25 NOTE — Telephone Encounter (Signed)
I am not familiar with this medicine.  Please check with clinical pharmacy today let patient know.

## 2017-09-25 NOTE — Telephone Encounter (Signed)
I think she must mean Asacol?   Which is mesalamine and used for diverticulitis

## 2017-09-28 NOTE — Telephone Encounter (Signed)
She wants a rx for this sent in?? She hasnt had it in years - will need directions - pt aware it may be WED before we call her

## 2017-09-30 ENCOUNTER — Other Ambulatory Visit: Payer: Self-pay | Admitting: Pharmacist

## 2017-09-30 ENCOUNTER — Ambulatory Visit: Payer: Self-pay | Admitting: Pharmacist

## 2017-09-30 MED ORDER — MESALAMINE 400 MG PO CPDR: 400.0000 mg | DELAYED_RELEASE_CAPSULE | Freq: Two times a day (BID) | ORAL | 1 refills | Status: DC

## 2017-09-30 NOTE — Patient Outreach (Signed)
[facility]) Care Management  09/30/2017  [place] 03-01-1941 [id number]   Care coordination call placed to Elizabeth [name].  Per representative, attestation letter and application needing provider signature mailed to patient in December.  They have not received anything back from patient yet.    Successful call placed to Elizabeth [name].  HIPAA identifiers verified.  Elizabeth [name] reports that she returned application and attestation letter early last week.    Plan: I will follow-up with Elizabeth [name] in 2 weeks to follow-up on PAP status.   Ralene Bathe, PharmD, [place] [phone]

## 2017-09-30 NOTE — Telephone Encounter (Signed)
Pt.notified

## 2017-09-30 NOTE — Telephone Encounter (Signed)
Refill ordered

## 2017-09-30 NOTE — Telephone Encounter (Signed)
Start Asacol at the lowest dose and give a couple refills

## 2017-10-02 ENCOUNTER — Telehealth: Payer: Self-pay

## 2017-10-02 NOTE — Telephone Encounter (Signed)
Please let patient know that we are unable to get the medication requested because she does not have a diagnosis of ulcerative colitis or Crohn's disease.  Schedule patient to see Dr. Melony Overly to further evaluate the abdominal pain and the discomfort that she is having because she is requesting this medicine

## 2017-10-02 NOTE — Telephone Encounter (Signed)
Getting a prior auth for Delzicol 400 mg  They are asking me for a DX other than Diverticulosis  (when I look it up it says it treats Crohns and Ulcerative Colitis) Which are not on her list of problems and They want to know other meds that she has tried and I see none under HX   Help

## 2017-10-06 ENCOUNTER — Telehealth: Payer: Self-pay

## 2017-10-06 NOTE — Telephone Encounter (Signed)
Pt notified of recommendation Verbalizes understanding 

## 2017-10-06 NOTE — Telephone Encounter (Signed)
Insurance denied Delzicol  Formulary alternatives are Apriso capsules ER balsalazide cap., mesalamine enema, mesalamine delayed release tab., sulfasalazine tablets and sulfasalazine DR tablets

## 2017-10-06 NOTE — Telephone Encounter (Signed)
Make sure that patient gets an appointment with Dr.Rehman regarding the need for this medicine as we have no notes documenting that she is taking it in the past and he will have to document that for her to get the medicine.

## 2017-10-08 ENCOUNTER — Ambulatory Visit: Payer: PPO

## 2017-10-08 ENCOUNTER — Telehealth: Payer: Self-pay | Admitting: Pharmacist

## 2017-10-08 NOTE — Patient Outreach (Signed)
[place]) Care Management  10/08/2017  [place] 1941/08/18 [id number]  Incoming call received from Ms. Reinig. HIPAA identifiers verified. Ms. Yaney reports that she contacted [employer] regarding Januvia and has been approved for 2019.  She denies further medication needs at this time.  She reports she has my phone number saved in her phone and will call me if other issues come up in the future.   Plan: I will close patient case at this time.  I will sent an MD closure letter and alert [facility] CMA.   Ralene Bathe, PharmD, [place] [phone]

## 2017-10-13 ENCOUNTER — Other Ambulatory Visit: Payer: Self-pay | Admitting: Family Medicine

## 2017-10-14 ENCOUNTER — Ambulatory Visit: Payer: Self-pay | Admitting: Pharmacist

## 2017-10-21 ENCOUNTER — Ambulatory Visit (INDEPENDENT_AMBULATORY_CARE_PROVIDER_SITE_OTHER): Payer: PPO | Admitting: Family Medicine

## 2017-10-21 ENCOUNTER — Encounter: Payer: Self-pay | Admitting: Family Medicine

## 2017-10-21 VITALS — BP 132/73 | HR 91 | Temp 97.8°F | Ht 62.0 in | Wt 180.0 lb

## 2017-10-21 DIAGNOSIS — I700 Atherosclerosis of aorta: Secondary | ICD-10-CM | POA: Diagnosis not present

## 2017-10-21 DIAGNOSIS — E7849 Other hyperlipidemia: Secondary | ICD-10-CM | POA: Diagnosis not present

## 2017-10-21 DIAGNOSIS — I10 Essential (primary) hypertension: Secondary | ICD-10-CM

## 2017-10-21 DIAGNOSIS — E079 Disorder of thyroid, unspecified: Secondary | ICD-10-CM | POA: Diagnosis not present

## 2017-10-21 DIAGNOSIS — I2510 Atherosclerotic heart disease of native coronary artery without angina pectoris: Secondary | ICD-10-CM | POA: Diagnosis not present

## 2017-10-21 DIAGNOSIS — E559 Vitamin D deficiency, unspecified: Secondary | ICD-10-CM | POA: Diagnosis not present

## 2017-10-21 DIAGNOSIS — I6930 Unspecified sequelae of cerebral infarction: Secondary | ICD-10-CM | POA: Diagnosis not present

## 2017-10-21 DIAGNOSIS — I639 Cerebral infarction, unspecified: Secondary | ICD-10-CM

## 2017-10-21 DIAGNOSIS — F32A Depression, unspecified: Secondary | ICD-10-CM

## 2017-10-21 DIAGNOSIS — E1142 Type 2 diabetes mellitus with diabetic polyneuropathy: Secondary | ICD-10-CM

## 2017-10-21 DIAGNOSIS — J449 Chronic obstructive pulmonary disease, unspecified: Secondary | ICD-10-CM

## 2017-10-21 DIAGNOSIS — F329 Major depressive disorder, single episode, unspecified: Secondary | ICD-10-CM | POA: Diagnosis not present

## 2017-10-21 DIAGNOSIS — F419 Anxiety disorder, unspecified: Secondary | ICD-10-CM | POA: Diagnosis not present

## 2017-10-21 DIAGNOSIS — K219 Gastro-esophageal reflux disease without esophagitis: Secondary | ICD-10-CM

## 2017-10-21 DIAGNOSIS — I6359 Cerebral infarction due to unspecified occlusion or stenosis of other cerebral artery: Secondary | ICD-10-CM

## 2017-10-21 LAB — BAYER DCA HB A1C WAIVED: HB A1C (BAYER DCA - WAIVED): 5.6 % (ref <7.0)

## 2017-10-21 NOTE — Progress Notes (Signed)
Subjective:    Patient ID: Elizabeth [name], female    DOB: Oct 13, 1940, 77 y.o.   MRN: [id number]  HPI  Pt here for follow up and management of chronic medical problems which includes diabetes, hypertension and hyperlipidemia. She Is taking medication regularly.  The patient is stable and doing well and has no specific complaints today.  This patient has multiple issues.  She has had a past CVA.  She has had atherosclerosis of the aorta.  She has COPD diabetes.  The patient has seen the cardiologist and he is mentioned starting her on a PC SK 9 inhibitor because of her statin intolerance.  I am encouraging her to call Dr. Harl Bowie and schedule this visit so that this can be initiated.  She also has an upcoming appointment with the ophthalmologist to check her eyes, Dr. Gershon Crane.  She denies any chest pain or shortness of breath.  She denies any trouble with swallowing heartburn indigestion nausea vomiting diarrhea blood in the stool or change in bowel habit.  She is passing her water without problems.  She is due to get a pelvic exam and Pap smear and will schedule this herself.  She also needs to get a DEXA scan and wants to wait on this.   Patient Active Problem List   Diagnosis Date Noted  . Type 2 diabetes mellitus with hyperlipidemia ([place]) 09/06/2015  . Type 2 diabetes mellitus with peripheral neuropathy ([place]) 11/27/2014  . CVA (cerebral vascular accident) ([place]) 02/27/2012  . Cerebral aneurysm 02/27/2012  . 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 Gait instability 02/27/2012  . Vision disturbance following cerebrovascular accident 02/27/2012  . Dyspnea 11/14/2011  . Thyroid disease 10/12/2008  . Diabetes ([place]) 08/29/2008  . Hyperlipidemia 08/29/2008  . OBESITY, UNSPECIFIED 08/29/2008  . COPD (chronic obstructive pulmonary disease) ([place]) 08/29/2008  . WEIGHT LOSS 08/29/2008  . DIARRHEA 08/29/2008  . HTN (hypertension) 08/28/2008  . ASTHMA 08/28/2008  . ESOPHAGEAL STRICTURE 08/28/2008  .  GERD 08/28/2008  . HIATAL HERNIA 08/28/2008  . DIVERTICULOSIS, COLON 08/28/2008  . COLONIC POLYPS, HX OF 08/28/2008   Outpatient Encounter Medications as of 10/21/2017  Medication Sig  . ALPRAZolam (XANAX) 0.25 MG tablet TAKE ONE TABLET BY MOUTH TWICE DAILY AS NEEDED  . amLODipine (NORVASC) 10 MG tablet Take 1 tablet (10 mg total) daily by mouth.  Marland Kitchen aspirin 325 MG EC tablet Take 325 mg by mouth daily.  Marland Kitchen CINNAMON PO Take 2,000 mg by mouth daily.  . clopidogrel (PLAVIX) 75 MG tablet TAKE ONE TABLET BY MOUTH DAILY (PLEASE USE AUROBINDO BRAND, PT HD FOR ALLERGY TO REDDY)  . escitalopram (LEXAPRO) 10 MG tablet TAKE ONE TABLET BY MOUTH DAILY  . glucose blood test strip Check BS BID and PRN. DX E11.9  . hydrochlorothiazide (MICROZIDE) 12.5 MG capsule TAKE 1 CAPSULE BY MOUTH EVERY DAY  . JANUVIA 50 MG tablet TAKE ONE TABLET BY MOUTH DAILY  . metFORMIN (GLUCOPHAGE) 1000 MG tablet TAKE 1 TABLET BY MOUTH EVERY MORNING WITH BREAKFAST and 1/2 tablet in evening with food (Patient taking differently: 1,000 mg 2 (two) times daily with a meal. TAKE 1 TABLET BY MOUTH EVERY MORNING WITH BREAKFAST and 1/2 tablet in evening with food)  . metoCLOPramide (REGLAN) 10 MG tablet TAKE ONE TABLET BY MOUTH 1 TIME DAILY AS NEEDED  . montelukast (SINGULAIR) 10 MG tablet Take 1 tablet (10 mg total) by mouth at bedtime.  . pantoprazole (PROTONIX) 40 MG tablet TAKE 1 TABLET BY MOUTH EVERY DAY --  FILL ONLY WHEN PATIENT REQUESTS 03/14/2016 KR--  . VENTOLIN HFA 108 (90 Base) MCG/ACT inhaler INHALE 2 PUFFS EVERY SIX HOURS AS NEEDED FOR WHEEZING OR SHORTNESS OF BREATH  . [DISCONTINUED] Mesalamine (ASACOL) 400 MG CPDR DR capsule Take 1 capsule (400 mg total) by mouth 2 (two) times daily.  . [DISCONTINUED] ramipril (ALTACE) 10 MG capsule Take 10 mg by mouth daily.     No facility-administered encounter medications on [name] as of 10/21/2017.       Review of Systems  Constitutional: Negative.   HENT: Negative.   Eyes: Negative.     Respiratory: Negative.   Cardiovascular: Negative.   Gastrointestinal: Negative.   Endocrine: Negative.   Genitourinary: Negative.   Musculoskeletal: Negative.   Skin: Negative.   Allergic/Immunologic: Negative.   Neurological: Negative.   Hematological: Negative.   Psychiatric/Behavioral: Negative.        Objective:   Physical Exam  Constitutional: She is oriented to person, place, and time. She appears well-developed and well-nourished.  The patient is pleasant and alert and occasionally just has a little bit of trouble articulating and I think this has to do with her past cerebral events.  She is on top of things but just slow to respond at times.  HENT:  Head: Normocephalic and atraumatic.  Right Ear: External ear normal.  Left Ear: External ear normal.  Nose: Nose normal.  Mouth/Throat: Oropharynx is clear and moist. No oropharyngeal exudate.  Eyes: Conjunctivae and EOM are normal. Pupils are equal, round, and reactive to light. Right eye exhibits no discharge. Left eye exhibits no discharge. No scleral icterus.  Neck: Normal range of motion. Neck supple. No thyromegaly present.  No bruits thyromegaly or anterior cervical adenopathy  Cardiovascular: Normal rate, regular rhythm, normal heart sounds and intact distal pulses.  No murmur heard. The heart is regular at 72/min  Pulmonary/Chest: Effort normal and breath sounds normal. No respiratory distress. She has no wheezes. She has no rales.  Dry cough and lungs are clear anteriorly and posteriorly  Abdominal: Soft. Bowel sounds are normal. She exhibits no mass. There is no tenderness. There is no rebound and no guarding.  Abdominal obesity without masses tenderness or organ enlargement or bruits  Musculoskeletal: Normal range of motion. She exhibits no edema.  Lymphadenopathy:    She has no cervical adenopathy.  Neurological: She is alert and oriented to person, place, and time. She has normal reflexes. No cranial nerve  deficit.  Skin: Skin is warm and dry. No rash noted.  Dry skin in general.  Psychiatric: She has a normal mood and affect. Her behavior is normal. Judgment and thought content normal.  Nursing note and vitals reviewed.  BP 132/73 (BP Location: Left Arm)   Pulse 91   Temp 97.8 F (36.6 C) (Oral)   Ht _0  (1.575 m)   Wt 180 lb (81.6 kg)   BMI 32.92 kg/m         Assessment & Plan:  1. Type 2 diabetes mellitus with peripheral neuropathy (HCC) -Continue to monitor blood sugars at home and follow a stricter diet as possible along with getting plenty of exercise - BMP8+EGFR - CBC with Differential/Platelet - Bayer DCA Hb A1c Waived  2. Essential hypertension -Blood pressure is good today and she will continue with current treatment - BMP8+EGFR - CBC with Differential/Platelet - Hepatic function panel  3. Other hyperlipidemia -The patient has been statin intolerant and it has been suggested to her by Dr. Harl Bowie her cardiologist that  she try a PC SK 9 inhibitor.  We had a long conversation about this and she plans to call them back set up a visit to get this done. - CBC with Differential/Platelet - Lipid panel  4. Gastroesophageal reflux disease, esophagitis presence not specified -Continue with current treatment - CBC with Differential/Platelet  5. Vitamin D deficiency -Continue with current treatment - CBC with Differential/Platelet - VITAMIN D 25 Hydroxy (Vit-D Deficiency, Fractures)  6. Thyroid disease -Continue with current treatment pending results of lab work - CBC with Differential/Platelet - Thyroid Panel With TSH  7. ASCVD (arteriosclerotic cardiovascular disease) -Follow-up with cardiology as planned - CBC with Differential/Platelet  8. Cerebrovascular accident (CVA), unspecified mechanism ([place]) -Follow-up with interventional radiologist as planned. Dr Patrecia Pour  9. Cerebrovascular accident (CVA) due to occlusion of other cerebral artery ([place]) -Follow-up  with interventional radiologist as planned  10. Chronic obstructive pulmonary disease, unspecified COPD type ([place]) -Continue to avoid irritating environments and use albuterol inhaler as needed  11. Abdominal aortic atherosclerosis ([place]) -Continue aggressive therapeutic lifestyle changes and highly consider starting PC SK 9 inhibitor treatment  12. Anxiety and depression -Continue with Lexapro and alprazolam as needed  Patient Instructions                       Medicare Annual Wellness Visit  [facility] and the medical providers at [facility] strive to bring you the best medical care.  In doing so we not only want to address your current medical conditions and concerns but also to detect new conditions early and prevent illness, disease and health-related problems.    Medicare offers a yearly Wellness Visit which allows our clinical staff to assess your need for preventative services including immunizations, lifestyle education, counseling to decrease risk of preventable diseases and screening for fall risk and other medical concerns.    This visit is provided free of charge (no copay) for all Medicare recipients. The clinical pharmacists at [facility] have begun to conduct these Wellness Visits which will also include a thorough review of all your medications.    As you primary medical provider recommend that you make an appointment for your Annual Wellness Visit if you have not done so already this year.  You may set up this appointment before you leave today or you may call back ([phone]) and schedule an appointment.  Please make sure when you call that you mention that you are scheduling your Annual Wellness Visit with the clinical pharmacist so that the appointment may be made for the proper length of time.     Continue current medications. Continue good therapeutic lifestyle changes which include good diet and exercise. Fall precautions  discussed with patient. If an FOBT was given today- please return it to our front desk. If you are over 50 years old - you may need Prevnar 33 or the adult Pneumonia vaccine.  **Flu shots are available--- please call and schedule a FLU-CLINIC appointment**  After your visit with [place] today you will receive a survey in the mail or online from [employer] regarding your care with [place]. Please take a moment to fill this out. Your feedback is very important to [place] as you can help [place] better understand your patient needs as well as improve your experience and satisfaction. WE CARE ABOUT YOU!!!   The patient should return and get a DEXA scan She should call and schedule a repeat visit with Dr. Harl Bowie to get  started on a PC SK 9 inhibitor She will be needing to see Dr. Patrecia Pour sometime this year She needs to schedule her eye exam and make sure that we get a copy of that report She should continue as aggressive therapeutic lifestyle changes as possible which include diet and exercise and regular checks of her feet and blood sugar   Arrie Senate MD

## 2017-10-21 NOTE — Patient Instructions (Addendum)
Medicare Annual Wellness Visit  [facility] and the medical providers at [facility] strive to bring you the best medical care.  In doing so we not only want to address your current medical conditions and concerns but also to detect new conditions early and prevent illness, disease and health-related problems.    Medicare offers a yearly Wellness Visit which allows our clinical staff to assess your need for preventative services including immunizations, lifestyle education, counseling to decrease risk of preventable diseases and screening for fall risk and other medical concerns.    This visit is provided free of charge (no copay) for all Medicare recipients. The clinical pharmacists at [facility] have begun to conduct these Wellness Visits which will also include a thorough review of all your medications.    As you primary medical provider recommend that you make an appointment for your Annual Wellness Visit if you have not done so already this year.  You may set up this appointment before you leave today or you may call back ([phone]) and schedule an appointment.  Please make sure when you call that you mention that you are scheduling your Annual Wellness Visit with the clinical pharmacist so that the appointment may be made for the proper length of time.     Continue current medications. Continue good therapeutic lifestyle changes which include good diet and exercise. Fall precautions discussed with patient. If an FOBT was given today- please return it to our front desk. If you are over 47 years old - you may need Prevnar 79 or the adult Pneumonia vaccine.  **Flu shots are available--- please call and schedule a FLU-CLINIC appointment**  After your visit with [place] today you will receive a survey in the mail or online from [employer] regarding your care with [place]. Please take a moment to fill this out. Your feedback is very  important to [place] as you can help [place] better understand your patient needs as well as improve your experience and satisfaction. WE CARE ABOUT YOU!!!   The patient should return and get a DEXA scan She should call and schedule a repeat visit with Dr. Harl Bowie to get started on a PC SK 9 inhibitor She will be needing to see Dr. Patrecia Pour sometime this year She needs to schedule her eye exam and make sure that we get a copy of that report She should continue as aggressive therapeutic lifestyle changes as possible which include diet and exercise and regular checks of her feet and blood sugar

## 2017-10-22 ENCOUNTER — Encounter: Payer: Self-pay | Admitting: Family Medicine

## 2017-10-22 DIAGNOSIS — R7989 Other specified abnormal findings of blood chemistry: Secondary | ICD-10-CM | POA: Insufficient documentation

## 2017-10-22 DIAGNOSIS — R945 Abnormal results of liver function studies: Secondary | ICD-10-CM

## 2017-10-22 LAB — BMP8+EGFR
BUN/Creatinine Ratio: 11 — ABNORMAL LOW (ref 12–28)
BUN: 8 mg/dL (ref 8–27)
CO2: 22 mmol/L (ref 20–29)
Calcium: 10.4 mg/dL — ABNORMAL HIGH (ref 8.7–10.3)
Chloride: 100 mmol/L (ref 96–106)
Creatinine, Ser: 0.76 mg/dL (ref 0.57–1.00)
GFR calc Af Amer: 89 mL/min/{1.73_m2} (ref >59)
GFR calc non Af Amer: 78 mL/min/{1.73_m2} (ref >59)
Glucose: 127 mg/dL — ABNORMAL HIGH (ref 65–99)
Potassium: 4 mmol/L (ref 3.5–5.2)
Sodium: 138 mmol/L (ref 134–144)

## 2017-10-22 LAB — LIPID PANEL
Chol/HDL Ratio: 5.5 ratio — ABNORMAL HIGH (ref 0.0–4.4)
Cholesterol, Total: 260 mg/dL — ABNORMAL HIGH (ref 100–199)
HDL: 47 mg/dL (ref >39)
LDL Calculated: 190 mg/dL — ABNORMAL HIGH (ref 0–99)
Triglycerides: 117 mg/dL (ref 0–149)
VLDL Cholesterol Cal: 23 mg/dL (ref 5–40)

## 2017-10-22 LAB — HEPATIC FUNCTION PANEL
ALT: 45 IU/L — ABNORMAL HIGH (ref 0–32)
AST: 50 IU/L — ABNORMAL HIGH (ref 0–40)
Albumin: 4.6 g/dL (ref 3.5–4.8)
Alkaline Phosphatase: 106 IU/L (ref 39–117)
Bilirubin Total: 0.5 mg/dL (ref 0.0–1.2)
Bilirubin, Direct: 0.15 mg/dL (ref 0.00–0.40)
Total Protein: 7.2 g/dL (ref 6.0–8.5)

## 2017-10-22 LAB — CBC WITH DIFFERENTIAL/PLATELET
Basophils Absolute: 0 10*3/uL (ref 0.0–0.2)
Basos: 1 %
EOS (ABSOLUTE): 0.1 10*3/uL (ref 0.0–0.4)
Eos: 2 %
Hematocrit: 37.1 % (ref 34.0–46.6)
Hemoglobin: 12.4 g/dL (ref 11.1–15.9)
Immature Grans (Abs): 0 10*3/uL (ref 0.0–0.1)
Immature Granulocytes: 0 %
Lymphocytes Absolute: 1.6 10*3/uL (ref 0.7–3.1)
Lymphs: 27 %
MCH: 25.7 pg — ABNORMAL LOW (ref 26.6–33.0)
MCHC: 33.4 g/dL (ref 31.5–35.7)
MCV: 77 fL — ABNORMAL LOW (ref 79–97)
Monocytes Absolute: 0.4 10*3/uL (ref 0.1–0.9)
Monocytes: 7 %
Neutrophils Absolute: 3.8 10*3/uL (ref 1.4–7.0)
Neutrophils: 63 %
Platelets: 338 10*3/uL (ref 150–379)
RBC: 4.82 x10E6/uL (ref 3.77–5.28)
RDW: 15.9 % — ABNORMAL HIGH (ref 12.3–15.4)
WBC: 6 10*3/uL (ref 3.4–10.8)

## 2017-10-22 LAB — VITAMIN D 25 HYDROXY (VIT D DEFICIENCY, FRACTURES): Vit D, 25-Hydroxy: 21.3 ng/mL — ABNORMAL LOW (ref 30.0–100.0)

## 2017-10-22 LAB — THYROID PANEL WITH TSH
Free Thyroxine Index: 1.8 (ref 1.2–4.9)
T3 Uptake Ratio: 21 % — ABNORMAL LOW (ref 24–39)
T4, Total: 8.4 ug/dL (ref 4.5–12.0)
TSH: 1.97 u[IU]/mL (ref 0.450–4.500)

## 2017-10-27 ENCOUNTER — Telehealth: Payer: Self-pay | Admitting: Family Medicine

## 2017-10-27 ENCOUNTER — Encounter: Payer: Self-pay | Admitting: *Deleted

## 2017-10-28 ENCOUNTER — Telehealth: Payer: Self-pay | Admitting: Cardiology

## 2017-10-28 NOTE — Telephone Encounter (Signed)
New message   Pt called wanting information about a new medication and she only wants to speak with a nurse. Please call

## 2017-10-28 NOTE — Telephone Encounter (Signed)
Returned pt call. She was not at home. I left message for pt to return call.

## 2017-10-30 NOTE — Telephone Encounter (Signed)
Pt called and states that she would like information on the medication the Dr. Is thinking about putting her on mailed to her home. She would like to read about it before making an appointment. She is available to talk all day except 1-4pm.

## 2017-10-30 NOTE — Telephone Encounter (Signed)
Not exactly sure which medication she is referring to? It does appear she was referred for consideration of PCSK9i therapy, but cancelled her appointment. We do not have materials to on PCSK9i to mail to the home, but the name of the two PCSK9i therapy options can be given to the patient for her to research. Repatha or Praluent.

## 2017-11-03 NOTE — Telephone Encounter (Signed)
Patient notified and encouraged to reschedule appt.

## 2017-11-11 ENCOUNTER — Telehealth: Payer: Self-pay | Admitting: Pharmacist

## 2017-11-11 NOTE — Telephone Encounter (Signed)
Pt called to report that she had reaction to montelukast. She states that she has bad reaction to all her medications. Asked if there was something that I could help with since we are not prescribing Singulair and she reports that she was worried we would give her Repatha and she might have a reaction to that. Advised there was another option Praluent that did not contain latex that we could use. She then states she will come in for a visit to discuss options. Apt made for first available 11/27/17.

## 2017-11-12 ENCOUNTER — Other Ambulatory Visit: Payer: Self-pay | Admitting: Family Medicine

## 2017-11-18 ENCOUNTER — Other Ambulatory Visit: Payer: Self-pay | Admitting: Family Medicine

## 2017-11-27 ENCOUNTER — Ambulatory Visit: Payer: PPO | Admitting: Pharmacist

## 2017-11-27 NOTE — Progress Notes (Deleted)
Patient ID: DEJHA KING                 DOB: 1941/06/27                    MRN: [id number]     HPI: Elizabeth [name] is a 77 y.o. female patient referred to lipid clinic by Dr Harl Bowie. PMH is significant for nonobstructive CAD, stroke, HTN, HLD with statin intolerance, DM, tobacco abuse, obesity, and anxiety.  Side effects with statins? Baseline LDL has ranged 171 to 122. Still smoking? HTA Medicare  Current Medications:  Intolerances: Crestor 10mg  and 20mg  daily, Livalo 2mg  every other day, Zetia - ineffective per patient Risk Factors: CAD, stroke, HTN, DM, tobacco abuse, obesity LDL goal: 70mg /dL  Diet:   Exercise:   Family History: Mother with DM, heart disease, kidney disease, HTN, and drug abuse.  Social History: Smokes 1 PPD for 20 years, denies alcohol use or illicit drug use.  Labs: 10/21/17: TC 260, TG 117, HDL 47, LDL 190 (no lipid lowering therapy)   Past Medical History:  Diagnosis Date  . Active smoker   . Allergy   . Anxiety    Takes Xanax for anxiety  . Arthritis   . Asthma   . Cataract   . Chronic airway obstruction, not elsewhere classified   . Clotting disorder ([place])   . Depression   . Diarrhea   . Diverticulosis of colon (without mention of hemorrhage)   . Esophageal reflux   . Family history of malignant neoplasm of gastrointestinal tract   . Headache(784.0)    Irregular  . Neuromuscular disorder ([place])   . Obesity, unspecified   . Other and unspecified hyperlipidemia   . Personal history of colonic polyps   . Stricture and stenosis of esophagus   . Stroke ([place])   . Thyroid disease   . Type II or unspecified type diabetes mellitus without mention of complication, not stated as uncontrolled   . Unspecified asthma(493.90)   . Unspecified essential hypertension     Current Outpatient Medications on File Prior to Visit  Medication Sig Dispense Refill  . ALPRAZolam (XANAX) 0.25 MG tablet TAKE ONE TABLET BY MOUTH TWICE DAILY AS NEEDED 60 tablet 0    . amLODipine (NORVASC) 10 MG tablet TAKE ONE TABLET BY MOUTH DAILY 90 tablet 1  . aspirin 325 MG EC tablet Take 325 mg by mouth daily.    Marland Kitchen CINNAMON PO Take 2,000 mg by mouth daily.    . clopidogrel (PLAVIX) 75 MG tablet TAKE ONE TABLET BY MOUTH DAILY (PLEASE USE AUROBINDO BRAND, PT HD FOR ALLERGY TO REDDY) 90 tablet 1  . escitalopram (LEXAPRO) 10 MG tablet TAKE ONE TABLET BY MOUTH DAILY 90 tablet 0  . glucose blood test strip Check BS BID and PRN. DX E11.9 100 each 11  . hydrochlorothiazide (MICROZIDE) 12.5 MG capsule TAKE 1 CAPSULE BY MOUTH EVERY DAY 90 capsule 0  . JANUVIA 50 MG tablet TAKE ONE TABLET BY MOUTH DAILY 30 tablet 2  . metFORMIN (GLUCOPHAGE) 1000 MG tablet TAKE ONE TABLET BY MOUTH EVERY MORNING WITH BREAKFAST AND ONE-HALF TABLET EVERY EVENING WITH FOOD 135 tablet 0  . metoCLOPramide (REGLAN) 10 MG tablet TAKE ONE TABLET BY MOUTH 1 TIME DAILY AS NEEDED 30 tablet 5  . montelukast (SINGULAIR) 10 MG tablet Take 1 tablet (10 mg total) by mouth at bedtime. 90 tablet 4  . pantoprazole (PROTONIX) 40 MG tablet TAKE 1 TABLET BY MOUTH EVERY DAY --  FILL ONLY WHEN PATIENT REQUESTS 03/14/2016 KR-- 90 tablet 0  . VENTOLIN HFA 108 (90 Base) MCG/ACT inhaler INHALE 2 PUFFS EVERY SIX HOURS AS NEEDED FOR WHEEZING OR SHORTNESS OF BREATH 18 g 2  . [DISCONTINUED] ramipril (ALTACE) 10 MG capsule Take 10 mg by mouth daily.       No current facility-administered medications on file prior to visit.     Allergies  Allergen Reactions  . Banana Anaphylaxis  . Stevia [Stevioside] Swelling    Face, tongue, and eye swelling   . Actos [Pioglitazone] Swelling  . Benicar [Olmesartan Medoxomil] Other (See Comments)    Feel bad  . Chantix [Varenicline] Other (See Comments)    "Worked opposite."  . Codeine Nausea And Vomiting  . Diovan [Valsartan] Other (See Comments)    Feel bad  . Latex     REACTION: red rash  . Lipitor [Atorvastatin] Other (See Comments)    REACTION:  Joint pain  . Spinach      Stomach problems  . Vicodin [Hydrocodone-Acetaminophen] Nausea And Vomiting  . Zocor [Simvastatin] Other (See Comments)    REACTION:  "made liver function incorrectly"  . Ace Inhibitors Cough  . Celebrex [Celecoxib] Rash  . Penicillins Rash  . Sulfonamide Derivatives Rash    Assessment/Plan:  1. Hyperlipidemia -

## 2017-11-30 ENCOUNTER — Telehealth: Payer: Self-pay | Admitting: Family Medicine

## 2017-11-30 MED ORDER — AZITHROMYCIN 250 MG PO TABS: ORAL_TABLET | ORAL | 0 refills | Status: DC

## 2017-11-30 NOTE — Telephone Encounter (Signed)
May refill Periactin

## 2017-11-30 NOTE — Telephone Encounter (Signed)
Sent in Z pack and advised of message. Patient is requesting a depo medrol shot. Appt made for tomorrow in nurse visit for shot if that is ok. Please advise and route to [facility]

## 2017-11-30 NOTE — Telephone Encounter (Signed)
Patient is coughing, congested since Thursday 3/21.  Could she have something called in for her at [facility].  Please call and let her know.

## 2017-11-30 NOTE — Telephone Encounter (Signed)
Please review and advise. Please route to pool A

## 2017-11-30 NOTE — Telephone Encounter (Signed)
Please call the patient in a Z-Pak to take as directed and she should use plain Mucinex maximum strength 1 to twice daily with a large glass of water and drink plenty of fluids

## 2017-11-30 NOTE — Telephone Encounter (Signed)
Patient would also like to restart periactin and would like it sent to [facility]. She states that you took her off of this med years ago and she wants to restart

## 2017-12-01 ENCOUNTER — Ambulatory Visit (INDEPENDENT_AMBULATORY_CARE_PROVIDER_SITE_OTHER): Payer: PPO | Admitting: *Deleted

## 2017-12-01 DIAGNOSIS — J209 Acute bronchitis, unspecified: Secondary | ICD-10-CM

## 2017-12-01 MED ORDER — METHYLPREDNISOLONE ACETATE 80 MG/ML IJ SUSP
60.0000 mg | Freq: Once | INTRAMUSCULAR | Status: AC
  Administered 2017-12-01: 60 mg via INTRAMUSCULAR

## 2017-12-01 MED ORDER — CYPROHEPTADINE HCL 4 MG PO TABS: 4.0000 mg | ORAL_TABLET | Freq: Three times a day (TID) | ORAL | 2 refills | Status: DC | PRN

## 2017-12-01 NOTE — Progress Notes (Signed)
Pt given Depomedrol inj Tolerated well

## 2017-12-01 NOTE — Telephone Encounter (Signed)
rf sent in

## 2017-12-02 ENCOUNTER — Other Ambulatory Visit: Payer: Self-pay | Admitting: Family Medicine

## 2017-12-09 ENCOUNTER — Other Ambulatory Visit (INDEPENDENT_AMBULATORY_CARE_PROVIDER_SITE_OTHER): Payer: PPO

## 2017-12-09 DIAGNOSIS — Z13820 Encounter for screening for osteoporosis: Secondary | ICD-10-CM | POA: Diagnosis not present

## 2017-12-09 DIAGNOSIS — Z780 Asymptomatic menopausal state: Secondary | ICD-10-CM

## 2017-12-17 ENCOUNTER — Telehealth: Payer: Self-pay | Admitting: Family Medicine

## 2017-12-18 NOTE — Telephone Encounter (Signed)
We can talk more at the next visit or schedule her with Sharyn Lull

## 2017-12-18 NOTE — Telephone Encounter (Signed)
FYI Pt has tapered off of Xanax Pt also has read side effects of Prolia and is afraid to try medication.

## 2018-01-03 ENCOUNTER — Other Ambulatory Visit: Payer: Self-pay | Admitting: Family Medicine

## 2018-01-09 ENCOUNTER — Other Ambulatory Visit: Payer: Self-pay | Admitting: Family Medicine

## 2018-01-12 ENCOUNTER — Other Ambulatory Visit: Payer: Self-pay | Admitting: Radiology

## 2018-01-13 ENCOUNTER — Other Ambulatory Visit: Payer: Self-pay | Admitting: Student

## 2018-01-14 ENCOUNTER — Ambulatory Visit (HOSPITAL_COMMUNITY)
Admission: RE | Admit: 2018-01-14 | Discharge: 2018-01-14 | Disposition: A | Discharge status: Home / Self Care | Payer: PPO | Source: Ambulatory Visit | Attending: Interventional Radiology | Admitting: Interventional Radiology

## 2018-01-14 ENCOUNTER — Other Ambulatory Visit (HOSPITAL_COMMUNITY): Payer: Self-pay | Admitting: Interventional Radiology

## 2018-01-14 ENCOUNTER — Encounter (HOSPITAL_COMMUNITY): Payer: Self-pay

## 2018-01-14 DIAGNOSIS — Z6831 Body mass index (BMI) 31.0-31.9, adult: Secondary | ICD-10-CM | POA: Diagnosis not present

## 2018-01-14 DIAGNOSIS — K219 Gastro-esophageal reflux disease without esophagitis: Secondary | ICD-10-CM | POA: Insufficient documentation

## 2018-01-14 DIAGNOSIS — I729 Aneurysm of unspecified site: Secondary | ICD-10-CM

## 2018-01-14 DIAGNOSIS — Z882 Allergy status to sulfonamides status: Secondary | ICD-10-CM | POA: Diagnosis not present

## 2018-01-14 DIAGNOSIS — E079 Disorder of thyroid, unspecified: Secondary | ICD-10-CM | POA: Diagnosis not present

## 2018-01-14 DIAGNOSIS — E669 Obesity, unspecified: Secondary | ICD-10-CM | POA: Diagnosis not present

## 2018-01-14 DIAGNOSIS — H269 Unspecified cataract: Secondary | ICD-10-CM | POA: Diagnosis not present

## 2018-01-14 DIAGNOSIS — I6502 Occlusion and stenosis of left vertebral artery: Secondary | ICD-10-CM | POA: Diagnosis not present

## 2018-01-14 DIAGNOSIS — R42 Dizziness and giddiness: Secondary | ICD-10-CM

## 2018-01-14 DIAGNOSIS — Z885 Allergy status to narcotic agent status: Secondary | ICD-10-CM | POA: Diagnosis not present

## 2018-01-14 DIAGNOSIS — Z8349 Family history of other endocrine, nutritional and metabolic diseases: Secondary | ICD-10-CM | POA: Insufficient documentation

## 2018-01-14 DIAGNOSIS — Z886 Allergy status to analgesic agent status: Secondary | ICD-10-CM | POA: Insufficient documentation

## 2018-01-14 DIAGNOSIS — E119 Type 2 diabetes mellitus without complications: Secondary | ICD-10-CM | POA: Diagnosis not present

## 2018-01-14 DIAGNOSIS — Z86010 Personal history of colonic polyps: Secondary | ICD-10-CM | POA: Insufficient documentation

## 2018-01-14 DIAGNOSIS — Z7984 Long term (current) use of oral hypoglycemic drugs: Secondary | ICD-10-CM | POA: Diagnosis not present

## 2018-01-14 DIAGNOSIS — J45909 Unspecified asthma, uncomplicated: Secondary | ICD-10-CM | POA: Insufficient documentation

## 2018-01-14 DIAGNOSIS — Z7982 Long term (current) use of aspirin: Secondary | ICD-10-CM | POA: Diagnosis not present

## 2018-01-14 DIAGNOSIS — F17210 Nicotine dependence, cigarettes, uncomplicated: Secondary | ICD-10-CM | POA: Insufficient documentation

## 2018-01-14 DIAGNOSIS — I10 Essential (primary) hypertension: Secondary | ICD-10-CM | POA: Diagnosis not present

## 2018-01-14 DIAGNOSIS — Z7901 Long term (current) use of anticoagulants: Secondary | ICD-10-CM | POA: Insufficient documentation

## 2018-01-14 DIAGNOSIS — M1990 Unspecified osteoarthritis, unspecified site: Secondary | ICD-10-CM | POA: Diagnosis not present

## 2018-01-14 DIAGNOSIS — Z880 Allergy status to penicillin: Secondary | ICD-10-CM | POA: Insufficient documentation

## 2018-01-14 DIAGNOSIS — I672 Cerebral atherosclerosis: Secondary | ICD-10-CM | POA: Diagnosis not present

## 2018-01-14 DIAGNOSIS — Z91018 Allergy to other foods: Secondary | ICD-10-CM | POA: Diagnosis not present

## 2018-01-14 DIAGNOSIS — Z79899 Other long term (current) drug therapy: Secondary | ICD-10-CM | POA: Diagnosis not present

## 2018-01-14 DIAGNOSIS — Z825 Family history of asthma and other chronic lower respiratory diseases: Secondary | ICD-10-CM | POA: Insufficient documentation

## 2018-01-14 DIAGNOSIS — Z8249 Family history of ischemic heart disease and other diseases of the circulatory system: Secondary | ICD-10-CM | POA: Insufficient documentation

## 2018-01-14 DIAGNOSIS — I639 Cerebral infarction, unspecified: Secondary | ICD-10-CM

## 2018-01-14 DIAGNOSIS — E7849 Other hyperlipidemia: Secondary | ICD-10-CM | POA: Insufficient documentation

## 2018-01-14 DIAGNOSIS — I671 Cerebral aneurysm, nonruptured: Secondary | ICD-10-CM | POA: Insufficient documentation

## 2018-01-14 DIAGNOSIS — Z9889 Other specified postprocedural states: Secondary | ICD-10-CM | POA: Insufficient documentation

## 2018-01-14 DIAGNOSIS — Z833 Family history of diabetes mellitus: Secondary | ICD-10-CM | POA: Insufficient documentation

## 2018-01-14 DIAGNOSIS — Z90710 Acquired absence of both cervix and uterus: Secondary | ICD-10-CM | POA: Insufficient documentation

## 2018-01-14 DIAGNOSIS — I6603 Occlusion and stenosis of bilateral middle cerebral arteries: Secondary | ICD-10-CM | POA: Insufficient documentation

## 2018-01-14 HISTORY — PX: IR ANGIO VERTEBRAL SEL VERTEBRAL UNI R MOD SED: IMG5368

## 2018-01-14 HISTORY — PX: IR ANGIO INTRA EXTRACRAN SEL COM CAROTID INNOMINATE BILAT MOD SED: IMG5360

## 2018-01-14 HISTORY — PX: IR ANGIO VERTEBRAL SEL SUBCLAVIAN INNOMINATE UNI L MOD SED: IMG5364

## 2018-01-14 LAB — CBC
HCT: 36.7 % (ref 36.0–46.0)
Hemoglobin: 11.7 g/dL — ABNORMAL LOW (ref 12.0–15.0)
MCH: 25.7 pg — ABNORMAL LOW (ref 26.0–34.0)
MCHC: 31.9 g/dL (ref 30.0–36.0)
MCV: 80.7 fL (ref 78.0–100.0)
Platelets: 309 10*3/uL (ref 150–400)
RBC: 4.55 MIL/uL (ref 3.87–5.11)
RDW: 15.9 % — ABNORMAL HIGH (ref 11.5–15.5)
WBC: 8.4 10*3/uL (ref 4.0–10.5)

## 2018-01-14 LAB — BASIC METABOLIC PANEL
Anion gap: 10 (ref 5–15)
BUN: 12 mg/dL (ref 6–20)
CO2: 25 mmol/L (ref 22–32)
Calcium: 9.6 mg/dL (ref 8.9–10.3)
Chloride: 102 mmol/L (ref 101–111)
Creatinine, Ser: 0.77 mg/dL (ref 0.44–1.00)
GFR calc Af Amer: >60 mL/min (ref >60)
GFR calc non Af Amer: >60 mL/min (ref >60)
Glucose, Bld: 140 mg/dL — ABNORMAL HIGH (ref 65–99)
Potassium: 3.5 mmol/L (ref 3.5–5.1)
Sodium: 137 mmol/L (ref 135–145)

## 2018-01-14 LAB — PROTIME-INR
INR: 1.13
Prothrombin Time: 14.5 seconds (ref 11.4–15.2)

## 2018-01-14 MED ORDER — FENTANYL CITRATE (PF) 100 MCG/2ML IJ SOLN
INTRAMUSCULAR | Status: AC | PRN
  Administered 2018-01-14: 25 ug via INTRAVENOUS

## 2018-01-14 MED ORDER — SODIUM CHLORIDE 0.9 % IV SOLN: INTRAVENOUS | Status: AC

## 2018-01-14 MED ORDER — IOHEXOL 300 MG/ML  SOLN
70.0000 mL | Freq: Once | INTRAMUSCULAR | Status: AC | PRN
  Administered 2018-01-14: 70 mL via INTRA_ARTERIAL

## 2018-01-14 MED ORDER — LIDOCAINE HCL 1 % IJ SOLN
INTRAMUSCULAR | Status: AC
  Filled 2018-01-14: qty 20

## 2018-01-14 MED ORDER — IOHEXOL 300 MG/ML  SOLN: 70.0000 mL | Freq: Once | INTRAMUSCULAR | Status: DC | PRN

## 2018-01-14 MED ORDER — HEPARIN SODIUM (PORCINE) 1000 UNIT/ML IJ SOLN
INTRAMUSCULAR | Status: AC
  Filled 2018-01-14: qty 1

## 2018-01-14 MED ORDER — IOHEXOL 300 MG/ML  SOLN: INTRAMUSCULAR | Status: AC | PRN

## 2018-01-14 MED ORDER — MIDAZOLAM HCL 2 MG/2ML IJ SOLN
INTRAMUSCULAR | Status: AC | PRN
  Administered 2018-01-14: 1 mg via INTRAVENOUS

## 2018-01-14 MED ORDER — IOPAMIDOL (ISOVUE-300) INJECTION 61%
INTRAVENOUS | Status: AC
Start: 2018-01-14 — End: 2018-01-14
  Administered 2018-01-14: 20 mL
  Filled 2018-01-14: qty 50

## 2018-01-14 MED ORDER — FENTANYL CITRATE (PF) 100 MCG/2ML IJ SOLN
INTRAMUSCULAR | Status: AC
  Filled 2018-01-14: qty 2

## 2018-01-14 MED ORDER — SODIUM CHLORIDE 0.9 % IV SOLN
Freq: Once | INTRAVENOUS | Status: AC
  Administered 2018-01-14: 07:00:00 via INTRAVENOUS

## 2018-01-14 MED ORDER — HEPARIN SODIUM (PORCINE) 1000 UNIT/ML IJ SOLN
INTRAMUSCULAR | Status: AC | PRN
  Administered 2018-01-14: 1000 [IU] via INTRAVENOUS

## 2018-01-14 MED ORDER — MIDAZOLAM HCL 2 MG/2ML IJ SOLN
INTRAMUSCULAR | Status: AC
Start: 2018-01-14 — End: 2018-01-14
  Filled 2018-01-14: qty 2

## 2018-01-14 MED ORDER — LIDOCAINE HCL (PF) 1 % IJ SOLN
INTRAMUSCULAR | Status: AC | PRN
  Administered 2018-01-14: 10 mL

## 2018-01-14 NOTE — Sedation Documentation (Signed)
Patient is resting comfortably. 

## 2018-01-14 NOTE — H&P (Signed)
Chief Complaint: Patient was seen in consultation today for cerebral arteriogram   Supervising Physician: Luanne Bras  Patient Status: [facility] - Out-pt  History of Present Illness: Elizabeth [name] is a 77 y.o. 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 Stable migrated coil at the proximal aspect of the right pericallosal artery. Approximately 50% narrowing of the left vertebral artery at its origin. The angiographic findings were reviewed with the patient and the patient's husband. Patient was asked to continue with her present regimen. Again she was strongly advised to stop smoking.  Scheduled now for 2 yr follow up Pt denies symptoms Denies N/V/ headache Denies speech or vision changes Doing well  Scheduled for cerebral arteriogram  Past Medical History:  Diagnosis Date  . Active smoker   . Allergy   . Anxiety    Takes Xanax for anxiety  . Arthritis   . Asthma   . Cataract   . Chronic airway obstruction, not elsewhere classified   . Clotting disorder ([place])   . Depression   . Diarrhea   . Diverticulosis of colon (without mention of hemorrhage)   . Esophageal reflux   . Family history of malignant neoplasm of gastrointestinal tract   . Headache(784.0)    Irregular  . Neuromuscular disorder ([place])   . Obesity, unspecified   . Other and unspecified hyperlipidemia   . Personal history of colonic polyps   . Stricture and stenosis of esophagus   . Stroke ([place])   . Thyroid disease   . Type II or unspecified type diabetes mellitus without mention of complication, not stated as uncontrolled   . Unspecified asthma(493.90)   . Unspecified essential hypertension     Past Surgical History:  Procedure Laterality Date  . APPENDECTOMY    .  BACK SURGERY     Spinal surgery  . CARDIAC CATHETERIZATION  2006   LAD: 30%, RCA : 20%, normal EF, elevated LVEDP  . CARDIAC CATHETERIZATION    . CEREBRAL ANGIOGRAM  12/20/2015  . KNEE ARTHROSCOPY     left  . LUMBAR DISC SURGERY    . TONSILLECTOMY    . TOTAL ABDOMINAL HYSTERECTOMY     menopause/bleeding    Allergies: Banana; Codeine; Stevia [stevioside]; Actos [pioglitazone]; Benicar [olmesartan medoxomil]; Chantix [varenicline]; Diovan [valsartan]; Latex; Lipitor [atorvastatin]; Spinach; Vicodin [hydrocodone-acetaminophen]; Zocor [simvastatin]; Ace inhibitors; Celebrex [celecoxib]; Penicillins; and Sulfonamide derivatives  Medications: Prior to Admission medications   Medication Sig Start Date End Date Taking? Authorizing Provider  amLODipine (NORVASC) 10 MG tablet TAKE ONE TABLET BY MOUTH DAILY 11/13/17  Yes Chipper Herb, MD  aspirin 325 MG EC tablet Take 325 mg by mouth daily.   Yes [provider]  Carboxymethylcellulose Sodium (REFRESH LIQUIGEL) 1 % GEL Place 1 drop into both eyes 2 (two) times daily as needed (dry eyes).   Yes [provider]  CINNAMON PO Take 2,000 mg by mouth daily.   Yes [provider]  clopidogrel (PLAVIX) 75 MG tablet TAKE ONE TABLET BY MOUTH DAILY (PLEASE USE AUROBINDO BRAND, PT HD FOR ALLERGY TO REDDY) 04/20/17  Yes Chipper Herb, MD  cyproheptadine (PERIACTIN) 4 MG tablet Take 1 tablet (4 mg total) by mouth 3 (three) times daily as needed for allergies. Patient taking differently: Take 4 mg by mouth daily.  12/01/17  Yes Chipper Herb, MD  escitalopram (LEXAPRO) 10 MG tablet TAKE ONE TABLET BY MOUTH EVERY DAY 01/04/18  Yes Chipper Herb, MD  hydrochlorothiazide (MICROZIDE) 12.5 MG capsule TAKE 1 CAPSULE BY MOUTH EVERY DAY 01/11/18  Yes Chipper Herb, MD  JANUVIA 50 MG tablet TAKE ONE TABLET BY MOUTH DAILY 05/18/17  Yes Chipper Herb, MD  metFORMIN (GLUCOPHAGE) 1000 MG tablet TAKE ONE TABLET BY MOUTH EVERY MORNING WITH  BREAKFAST AND ONE-HALF TABLET EVERY EVENING WITH FOOD Patient taking differently: Take 1,000 mg by mouth daily with breakfast. TAKE ONE TABLET BY MOUTH EVERY MORNING WITH BREAKFAST 11/19/17  Yes Chipper Herb, MD  metoCLOPramide (REGLAN) 10 MG tablet TAKE ONE TABLET BY MOUTH 1 TIME DAILY AS NEEDED Patient taking differently: Take 10 mg by mouth daily as needed for nausea. TAKE ONE TABLET BY MOUTH 1 TIME DAILY AS NEEDED 08/03/17  Yes Hassell Done, Olivya-Margaret, FNP  montelukast (SINGULAIR) 5 MG chewable tablet chew TWO tablets BY MOUTH AT BEDTIME Patient taking differently: chew ONE tablets BY MOUTH AT BEDTIME 12/02/17  Yes Chipper Herb, MD  Olopatadine HCl (PATADAY) 0.2 % SOLN Place 1 drop into both eyes 2 (two) times daily as needed (eye allergies).   Yes [provider]  pantoprazole (PROTONIX) 40 MG tablet TAKE 1 TABLET BY MOUTH EVERY DAY --FILL ONLY WHEN PATIENT REQUESTS 03/14/2016 KR-- 10/14/17  Yes Chipper Herb, MD  VENTOLIN HFA 108 [phone] Base) MCG/ACT inhaler INHALE 2 PUFFS EVERY SIX HOURS AS NEEDED FOR WHEEZING OR SHORTNESS OF BREATH 03/12/17  Yes Chipper Herb, MD  glucose blood test strip Check BS BID and PRN. DX E11.9 01/08/15   Chipper Herb, MD  ramipril (ALTACE) 10 MG capsule Take 10 mg by mouth daily.    11/14/11  [provider]     Family History  Problem Relation Age of Onset  . Diabetes Mother   . Heart disease Mother   . Asthma Mother   . Kidney disease Mother   . Drug abuse Mother   . Hypertension Mother   . Mental illness Mother        ?sociopath/psychopath  . Early death Father        trauma  . Colon cancer Other        first cousin, paternal aunt and uncle  . Cancer Paternal Grandmother        gastric  . Breast cancer Maternal Aunt        several paternal cousins  . Heart disease Maternal Grandmother   . Thyroid cancer Other        aunt  . Thyroid disease Sister        uncertain type-not cancer  . Heart disease Sister   . Heart disease Brother    . Cancer Sister        pancreatic    Social History   Socioeconomic History  . Marital status: Married    Spouse name: [name]  . Number of children: 0  . Years of education: 16  . Highest education level: Bachelor's degree (e.g., BA, AB, BS)  Occupational History  . Occupation: Therapist, sports    Comment: [employer]  Social Needs  . Financial resource strain: Not hard at all  . Food insecurity:    Worry: Never true    Inability: Never true  . Transportation needs:    Medical: No    Non-medical: No  Tobacco Use  . Smoking status: Current Every Day Smoker    Packs/day: 1.00  Years: 20.00    Pack years: 20.00    Types: Cigarettes    Start date: 01/15/1983  . Smokeless tobacco: Never Used  Substance and Sexual Activity  . Alcohol use: No    Alcohol/week: 0.0 oz  . Drug use: No  . Sexual activity: Not Currently  Lifestyle  . Physical activity:    Days per week: 0 days    Minutes per session: 0 min  . Stress: To some extent  Relationships  . Social connections:    Talks on phone: Not on file    Gets together: Not on file    Attends religious service: Not on file    Active member of club or organization: Not on file    Attends meetings of clubs or organizations: Not on file    Relationship status: Not on file  Other Topics Concern  . Not on file  Social History Narrative   Married [name]   Limited activity due to back pain    Review of Systems: A 12 point ROS discussed and pertinent positives are indicated in the HPI above.  All other systems are negative.  Review of Systems  Constitutional: Negative for activity change, fatigue and fever.  HENT: Negative for tinnitus, trouble swallowing and voice change.   Eyes: Negative for visual disturbance.  Respiratory: Positive for cough. Negative for shortness of breath.   Cardiovascular: Negative for chest pain.  Gastrointestinal: Negative for abdominal pain.  Neurological: Negative for dizziness, tremors, seizures,  syncope, facial asymmetry, speech difficulty, weakness, light-headedness, numbness and headaches.  Psychiatric/Behavioral: Negative for behavioral problems and confusion.    Vital Signs: BP 133/63   Pulse 68   Temp 98 F (36.7 C) (Oral)   Resp 16   Ht 5\' 2"  (1.575 m)   Wt 171 lb (77.6 kg)   SpO2 97%   BMI 31.28 kg/m   Physical Exam  Constitutional: She is oriented to person, place, and time. She appears well-nourished.  HENT:  Head: Atraumatic.  Eyes: EOM are normal.  Neck: Neck supple.  Cardiovascular: Normal rate and regular rhythm.  Pulmonary/Chest: Effort normal and breath sounds normal. She has no wheezes.  Abdominal: Soft. Bowel sounds are normal. There is no tenderness.  Musculoskeletal: Normal range of motion.  Neurological: She is alert and oriented to person, place, and time.  Skin: Skin is warm and dry.  Psychiatric: She has a normal mood and affect. Her behavior is normal. Judgment and thought content normal.  Nursing note and vitals reviewed.   Imaging: No results found.  Labs:  CBC: Recent Labs    01/21/17 1015 06/17/17 0819 10/21/17 1002 01/14/18 0650  WBC 6.4 5.7 6.0 8.4  HGB 13.1 12.3 12.4 11.7*  HCT 39.3 38.5 37.1 36.7  PLT 290 348 338 309    COAGS: Recent Labs    01/14/18 0650  INR 1.13    BMP: Recent Labs    01/21/17 1015 06/17/17 0819 10/21/17 1002 01/14/18 0650  NA 134* 139 138 137  K 3.6 3.9 4.0 3.5  CL 96* 96 100 102  CO2 28 23 22 25   GLUCOSE 188* 175* 127* 140*  BUN 12 7* 8 12  CALCIUM 9.8 10.3 10.4* 9.6  CREATININE 0.72 0.75 0.76 0.77  GFRNONAA >60 79 78 >60  GFRAA >60 91 89 >60    LIVER FUNCTION TESTS: Recent Labs    01/21/17 1015 06/17/17 0819 10/21/17 1002  BILITOT 0.9 0.5 0.5  AST 88* 56* 50*  ALT 86* 44* 45*  ALKPHOS 116 91 106  PROT 7.5 7.1 7.2  ALBUMIN 4.0 4.3 4.6    TUMOR MARKERS: No results for input(s): AFPTM, CEA, CA199, CHROMGRNA in the last 8760 hours.  Assessment and Plan:  Hx ACOM  aneurysm coiling 2013 All follow ups stable No symptoms or problems Last arteriogram 2017 Now for recheck Scheduled for cerebral arteriogram Risks and benefits of cerebral angiogram with intervention were discussed with the patient including, but not limited to bleeding, infection, vascular injury, contrast induced renal failure, stroke or even death.  This interventional procedure involves the use of X-rays and because of the nature of the planned procedure, it is possible that we will have prolonged use of X-ray fluoroscopy.  Potential radiation risks to you include (but are not limited to) the following: - A slightly elevated risk for cancer  several years later in life. This risk is typically less than 0.5% percent. This risk is low in comparison to the normal incidence of human cancer, which is 33% for women and 50% for men according to the [employer]. - Radiation induced injury can include skin redness, resembling a rash, tissue breakdown / ulcers and hair loss (which can be temporary or permanent).   The likelihood of either of these occurring depends on the difficulty of the procedure and whether you are sensitive to radiation due to previous procedures, disease, or genetic conditions.   IF your procedure requires a prolonged use of radiation, you will be notified and given written instructions for further action.  It is your responsibility to monitor the irradiated area for the 2 weeks following the procedure and to notify your physician if you are concerned that you have suffered a radiation induced injury.    All of the patient's questions were answered, patient is agreeable to proceed. Consent signed and in chart.  Thank you for this interesting consult.  I greatly enjoyed meeting CHERAE MARTON and look forward to participating in their care.  A copy of this report was sent to the requesting provider on this date.  Electronically Signed: Lavonia Drafts,  PA-C 01/14/2018, 7:49 AM   I spent a total of  30 Minutes   in face to face in clinical consultation, greater than 50% of which was counseling/coordinating care for cerebral arteriogram

## 2018-01-14 NOTE — Sedation Documentation (Signed)
IR tech holding pressure at this time.

## 2018-01-14 NOTE — Discharge Instructions (Addendum)
**Note -Identified via Obfuscation** Angiogram, Care After Refer to this sheet in the next few weeks. These instructions provide you with information on caring for yourself after your procedure. Your health care provider may also give you more specific instructions. Your treatment has been planned according to current medical practices, but problems sometimes occur. Call your health care provider if you have any problems or questions after your procedure. What can I expect after the procedure? After your procedure, it is typical to have the following:  Bruising at the catheter insertion site that usually fades within 1-2 weeks.  Blood collecting in the tissue (hematoma) that may be painful to the touch. It should usually decrease in size and tenderness within 1-2 weeks.  A mild headache.  Follow these instructions at home:  Take medicines only as directed by your health care provider.  You may shower 24-48 hours after the procedure or as directed by your health care provider. Remove the bandage (dressing) and gently wash the site with plain soap and water. Pat the area dry with a clean towel. Do not rub the site, because this may cause bleeding.  Do not take baths, swim, or use a hot tub until your health care provider approves.  Check your insertion site every day for redness, swelling, or drainage.  Do not apply powder or lotion to the site.  Do not lift over 10 lb (4.5 kg) for 5 days after your procedure or as directed by your health care provider.  Ask your health care provider when it is okay to: ? Return to work or school. ? Resume usual physical activities or sports. ? Resume sexual activity.  Do not drive home if you are discharged the same day as the procedure. Have someone else drive you.  You may drive 24 hours after the procedure unless otherwise instructed by your health care provider.  Do not operate machinery or power tools for 24 hours after the procedure or as directed by your health care  provider.  If your procedure was done as an outpatient procedure, which means that you went home the same day as your procedure, a responsible adult should be with you for the first 24 hours after you arrive home.  Keep all follow-up visits as directed by your health care provider. This is important. Contact a health care provider if:  You have a fever.  You have chills.  You have increased bleeding from the catheter insertion site. Hold pressure on the site. Get help right away if:  You have vision changes or loss of vision.  You have numbness or weakness on one side of your body.  You have difficulty talking, or you have slurred speech or cannot speak (aphasia).  You feel confused or have difficulty remembering.  You have unusual pain at the catheter insertion site.  You have redness, warmth, or swelling at the catheter insertion site.  You have drainage (other than a small amount of blood on the dressing) from the catheter insertion site.  The catheter insertion site is bleeding, and the bleeding does not stop after 30 minutes of holding steady pressure on the site. These symptoms may represent a serious problem that is an emergency. Do not wait to see if the symptoms will go away. Get medical help right away. Call your local emergency services (911 in [place] Do not drive yourself to the hospital. This information is not intended to replace advice given to you by your health care provider. Make sure you discuss any questions you **Note -Identified via Obfuscation** have with your health care provider. Document Released: 01/09/2014 Document Revised: 01/31/2016 Document Reviewed: 09/07/2013 Elsevier Interactive Patient Education  2017 [place]. Moderate Conscious Sedation, Adult, Care After These instructions provide you with information about caring for yourself after your procedure. Your health care provider may also give you more specific instructions. Your treatment has been planned according to current  medical practices, but problems sometimes occur. Call your health care provider if you have any problems or questions after your procedure. What can I expect after the procedure? After your procedure, it is common:  To feel sleepy for several hours.  To feel clumsy and have poor balance for several hours.  To have poor judgment for several hours.  To vomit if you eat too soon.  Follow these instructions at home: For at least 24 hours after the procedure:   Do not: ? Participate in activities where you could fall or become injured. ? Drive. ? Use heavy machinery. ? Drink alcohol. ? Take sleeping pills or medicines that cause drowsiness. ? Make important decisions or sign legal documents. ? Take care of children on your own.  Rest. Eating and drinking  Follow the diet recommended by your health care provider.  If you vomit: ? Drink water, juice, or soup when you can drink without vomiting. ? Make sure you have little or no nausea before eating solid foods. General instructions  Have a responsible adult stay with you until you are awake and alert.  Take over-the-counter and prescription medicines only as told by your health care provider.  If you smoke, do not smoke without supervision.  Keep all follow-up visits as told by your health care provider. This is important. Contact a health care provider if:  You keep feeling nauseous or you keep vomiting.  You feel light-headed.  You develop a rash.  You have a fever. Get help right away if:  You have trouble breathing. This information is not intended to replace advice given to you by your health care provider. Make sure you discuss any questions you have with your health care provider. Document Released: 06/15/2013 Document Revised: 01/28/2016 Document Reviewed: 12/15/2015 Elsevier Interactive Patient Education  [employer].

## 2018-01-14 NOTE — Procedures (Signed)
S/P 4 vessel cerebral arteriogram . RT CFA approach. Findings. 1.Obliterated ACOM aneurysym with no recanalization. 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

## 2018-01-14 NOTE — Sedation Documentation (Signed)
Bedrest starts at 0955.

## 2018-01-15 ENCOUNTER — Encounter (HOSPITAL_COMMUNITY): Payer: Self-pay | Admitting: Interventional Radiology

## 2018-01-19 ENCOUNTER — Other Ambulatory Visit (HOSPITAL_COMMUNITY): Payer: Self-pay | Admitting: Interventional Radiology

## 2018-01-19 DIAGNOSIS — I771 Stricture of artery: Secondary | ICD-10-CM

## 2018-01-19 DIAGNOSIS — I729 Aneurysm of unspecified site: Secondary | ICD-10-CM

## 2018-01-19 DIAGNOSIS — R42 Dizziness and giddiness: Secondary | ICD-10-CM

## 2018-01-21 ENCOUNTER — Other Ambulatory Visit: Payer: PPO | Admitting: Family

## 2018-01-26 ENCOUNTER — Other Ambulatory Visit: Payer: Self-pay | Admitting: Family Medicine

## 2018-02-02 ENCOUNTER — Ambulatory Visit (HOSPITAL_COMMUNITY)
Admission: RE | Admit: 2018-02-02 | Discharge: 2018-02-02 | Disposition: A | Discharge status: Home / Self Care | Payer: PPO | Source: Ambulatory Visit | Attending: Interventional Radiology | Admitting: Interventional Radiology

## 2018-02-02 DIAGNOSIS — I639 Cerebral infarction, unspecified: Secondary | ICD-10-CM | POA: Insufficient documentation

## 2018-02-02 DIAGNOSIS — I729 Aneurysm of unspecified site: Secondary | ICD-10-CM

## 2018-02-02 DIAGNOSIS — R42 Dizziness and giddiness: Secondary | ICD-10-CM

## 2018-02-02 DIAGNOSIS — I771 Stricture of artery: Secondary | ICD-10-CM

## 2018-02-02 NOTE — Consult Note (Signed)
Chief Complaint: Patient was seen in consultation today for left vertebral artery stenosis.  Supervising Physician: Luanne Bras  Patient Status: [facility] - Out-pt  History of Present Illness: Elizabeth [name] is a 77 y.o. female with a past medical history of hypertension, hyperlipidemia, CVA, asthma, diabetes mellitus type II, GERD, obesity, anxiety, depression, arthritis, and current every day smoker (1 PPD). She is known to [facility], she has been followed by Dr. Estanislado Pandy. 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 Estanislado Pandy. She has been followed with routine scans and diagnostic cerebral angiograms since this procedure.  Image-guided diagnostic cerebral angiogram 01/14/2018 with Dr. Estanislado Pandy: 1. Angiographically obliterated anterior communicating artery region aneurysm without evidence of recanalization or of coil compaction. 2. Previously displaced small coil in the right anterior cerebral artery A1 A2 junction stable with no obstruction of blood flow. 3. 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 4. 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  Patient presents today for management of left vertebral artery stenosis. Accompanied by husband. Complains of intermittent right-sided tinnitus. Describes noise as high-pitched ringing. States that pulling on her ear makes the noise go away. Denies headache, weakness, numbness/tingling, dizziness, vision changes, hearing changes, or speech difficulty.  Patient is currently taking Plavix 75 mg once daily and Aspirin 325 mg once daily.  Past Medical History:  Diagnosis Date  . Active smoker     . Allergy   . Anxiety    Takes Xanax for anxiety  . Arthritis   . Asthma   . Cataract   . Chronic airway obstruction, not elsewhere classified   . Clotting disorder ([place])   . Depression   . Diarrhea   . Diverticulosis of colon (without mention of hemorrhage)   . Esophageal reflux   . Family history of malignant neoplasm of gastrointestinal tract   . Headache(784.0)    Irregular  . Neuromuscular disorder ([place])   . Obesity, unspecified   . Other and unspecified hyperlipidemia   . Personal history of colonic polyps   . Stricture and stenosis of esophagus   . Stroke ([place])   . Thyroid disease   . Type II or unspecified type diabetes mellitus without mention of complication, not stated as uncontrolled   . Unspecified asthma(493.90)   . Unspecified essential hypertension     Past Surgical History:  Procedure Laterality Date  . APPENDECTOMY    . BACK SURGERY     Spinal surgery  . CARDIAC CATHETERIZATION  2006   LAD: 30%, RCA : 20%, normal EF, elevated LVEDP  . CARDIAC CATHETERIZATION    . CEREBRAL ANGIOGRAM  12/20/2015  . IR ANGIO INTRA EXTRACRAN SEL COM CAROTID INNOMINATE BILAT MOD SED  01/14/2018  . IR ANGIO VERTEBRAL SEL SUBCLAVIAN INNOMINATE UNI L MOD SED  01/14/2018  . IR ANGIO VERTEBRAL SEL VERTEBRAL UNI R MOD SED  01/14/2018  . KNEE ARTHROSCOPY     left  . LUMBAR DISC SURGERY    . TONSILLECTOMY    . TOTAL ABDOMINAL HYSTERECTOMY     menopause/bleeding    Allergies: Banana; Codeine; Stevia [stevioside]; Actos [pioglitazone]; Benicar [olmesartan medoxomil]; Chantix [varenicline]; Diovan [valsartan]; Latex; Lipitor [atorvastatin]; Spinach; Vicodin [hydrocodone-acetaminophen]; Zocor [simvastatin]; Ace inhibitors; Celebrex [celecoxib]; Penicillins; and Sulfonamide derivatives  Medications: Prior to Admission  medications   Medication Sig Start Date End Date Taking? Authorizing Provider  amLODipine (NORVASC) 10 MG tablet TAKE ONE TABLET BY MOUTH DAILY 11/13/17   Chipper Herb,  MD  aspirin 325 MG EC tablet Take 325 mg by mouth daily.    [provider]  Carboxymethylcellulose Sodium (REFRESH LIQUIGEL) 1 % GEL Place 1 drop into both eyes 2 (two) times daily as needed (dry eyes).    [provider]  CINNAMON PO Take 2,000 mg by mouth daily.    [provider]  clopidogrel (PLAVIX) 75 MG tablet TAKE ONE TABLET BY MOUTH DAILY (PLEASE USE AUROBINDO BRAND, PT HD FOR ALLERGY TO REDDY) 04/20/17   Chipper Herb, MD  cyproheptadine (PERIACTIN) 4 MG tablet Take 1 tablet (4 mg total) by mouth 3 (three) times daily as needed for allergies. Patient taking differently: Take 4 mg by mouth daily.  12/01/17   Chipper Herb, MD  escitalopram (LEXAPRO) 10 MG tablet TAKE ONE TABLET BY MOUTH EVERY DAY 01/04/18   Chipper Herb, MD  glucose blood test strip Check BS BID and PRN. DX E11.9 01/08/15   Chipper Herb, MD  hydrochlorothiazide (MICROZIDE) 12.5 MG capsule TAKE 1 CAPSULE BY MOUTH EVERY DAY 01/11/18   Chipper Herb, MD  JANUVIA 50 MG tablet TAKE ONE TABLET BY MOUTH DAILY 05/18/17   Chipper Herb, MD  metFORMIN (GLUCOPHAGE) 1000 MG tablet TAKE ONE TABLET BY MOUTH EVERY MORNING WITH BREAKFAST AND ONE-HALF TABLET EVERY EVENING WITH FOOD Patient taking differently: Take 1,000 mg by mouth daily with breakfast. TAKE ONE TABLET BY MOUTH EVERY MORNING WITH BREAKFAST 11/19/17   Chipper Herb, MD  metoCLOPramide (REGLAN) 10 MG tablet TAKE ONE TABLET BY MOUTH 1 TIME DAILY AS NEEDED Patient taking differently: Take 10 mg by mouth daily as needed for nausea. TAKE ONE TABLET BY MOUTH 1 TIME DAILY AS NEEDED 08/03/17   Hassell Done, Rashae-Margaret, FNP  montelukast (SINGULAIR) 5 MG chewable tablet chew TWO tablets BY MOUTH AT BEDTIME Patient taking differently: chew ONE tablets BY MOUTH AT BEDTIME 12/02/17   Chipper Herb, MD  Olopatadine HCl (PATADAY) 0.2 % SOLN Place 1 drop into both eyes 2 (two) times daily as needed (eye allergies).    [provider]  pantoprazole  (PROTONIX) 40 MG tablet TAKE 1 TABLET BY MOUTH EVERY DAY --FILL ONLY WHEN PATIENT REQUESTS 03/14/2016 KR-- 10/14/17   Chipper Herb, MD  PROAIR HFA 108 [phone] Base) MCG/ACT inhaler INHALE 2 PUFFS EVERY SIX HOURS AS NEEDED FOR WHEEZING OR SHORTNESS OF BREATH 01/26/18   Chipper Herb, MD  ramipril (ALTACE) 10 MG capsule Take 10 mg by mouth daily.    11/14/11  [provider]     Family History  Problem Relation Age of Onset  . Diabetes Mother   . Heart disease Mother   . Asthma Mother   . Kidney disease Mother   . Drug abuse Mother   . Hypertension Mother   . Mental illness Mother        ?sociopath/psychopath  . Early death Father        trauma  . Colon cancer Other        first cousin, paternal aunt and uncle  . Cancer Paternal Grandmother        gastric  . Breast cancer Maternal Aunt        several paternal cousins  . Heart disease Maternal Grandmother   . Thyroid cancer Other  aunt  . Thyroid disease Sister        uncertain type-not cancer  . Heart disease Sister   . Heart disease Brother   . Cancer Sister        pancreatic    Social History   Socioeconomic History  . Marital status: Married    Spouse name: [name]  . Number of children: 0  . Years of education: 16  . Highest education level: Bachelor's degree (e.g., BA, AB, BS)  Occupational History  . Occupation: Therapist, sports    Comment: [employer]  Social Needs  . Financial resource strain: Not hard at all  . Food insecurity:    Worry: Never true    Inability: Never true  . Transportation needs:    Medical: No    Non-medical: No  Tobacco Use  . Smoking status: Current Every Day Smoker    Packs/day: 1.00    Years: 20.00    Pack years: 20.00    Types: Cigarettes    Start date: 01/15/1983  . Smokeless tobacco: Never Used  Substance and Sexual Activity  . Alcohol use: No    Alcohol/week: 0.0 oz  . Drug use: No  . Sexual activity: Not Currently  Lifestyle  . Physical activity:    Days per  week: 0 days    Minutes per session: 0 min  . Stress: To some extent  Relationships  . Social connections:    Talks on phone: Not on file    Gets together: Not on file    Attends religious service: Not on file    Active member of club or organization: Not on file    Attends meetings of clubs or organizations: Not on file    Relationship status: Not on file  Other Topics Concern  . Not on file  Social History Narrative   Married [name]   Limited activity due to back pain     Review of Systems: A 12 point ROS discussed and pertinent positives are indicated in the HPI above.  All other systems are negative.  Review of Systems  Constitutional: Negative for activity change and fever.  HENT: Positive for tinnitus. Negative for hearing loss.   Eyes: Negative for visual disturbance.  Respiratory: Negative for shortness of breath and wheezing.   Cardiovascular: Negative for chest pain and palpitations.  Neurological: Negative for dizziness, speech difficulty, weakness, numbness and headaches.  Psychiatric/Behavioral: Negative for behavioral problems and confusion.    Vital Signs: There were no vitals taken for this visit.  Physical Exam  Constitutional: She is oriented to person, place, and time. She appears well-developed and well-nourished. No distress.  Pulmonary/Chest: Effort normal. No respiratory distress.  Neurological: She is alert and oriented to person, place, and time.  Skin: Skin is warm and dry.  Psychiatric: She has a normal mood and affect. Her behavior is normal. Judgment and thought content normal.  Nursing note and vitals reviewed.    Imaging: Ir Angio Intra Extracran Sel Com Carotid Innominate Bilat Mod Sed  Result Date: 01/15/2018 CLINICAL DATA:  Transient vertebrobasilar ischemic symptoms of dizziness with head turning, or stooping. EXAM: BILATERAL COMMON CAROTID AND INNOMINATE ANGIOGRAPHY; IR ANGIO VERTEBRAL SEL SUBCLAVIAN INNOMINATE UNI LEFT MOD SED; IR  ANGIO VERTEBRAL SEL VERTEBRAL UNI RIGHT MOD SED COMPARISON:  Diagnostic catheter arteriogram of 12/20/2015. MEDICATIONS: Heparin 1000 units IV; no antibiotic was administered within 1 hour of the procedure. ANESTHESIA/SEDATION: Versed 1 mg IV; Fentanyl 25 mcg IV. Moderate Sedation Time:  32  minutes. The patient was continuously monitored during the procedure by the interventional radiology nurse under my direct supervision. CONTRAST:  Isovue 300 approximately 60 mL. FLUOROSCOPY TIME:  Fluoroscopy Time: 7 minutes 48 seconds (1042 mGy). COMPLICATIONS: None immediate. TECHNIQUE: Informed written consent was obtained from the patient after a thorough discussion of the procedural risks, benefits and alternatives. All questions were addressed. Maximal Sterile Barrier Technique was utilized including caps, mask, sterile gowns, sterile gloves, sterile drape, hand hygiene and skin antiseptic. A timeout was performed prior to the initiation of the procedure. The right groin was prepped and draped in the usual sterile fashion. Thereafter using modified Seldinger technique, transfemoral access into the right common femoral artery was obtained without difficulty. Over a 0.035 inch guidewire, a 5 French Pinnacle sheath was inserted. Through this, and also over 0.035 inch guidewire, a 5 [place] JB 1 catheter was advanced to the aortic arch region and selectively positioned in the right common carotid artery, the right vertebral artery, the left common carotid artery and the left subclavian artery. FINDINGS: The right vertebral artery origin is widely patent. The proximal right vertebral artery demonstrates mild tortuosity distal to its origin. Otherwise, the vessel is seen to opacify normally to the cranial skull base. Wide patency is seen of the right posterior-inferior cerebellar artery and right vertebrobasilar junction. The basilar artery, the posterior cerebral arteries, the superior cerebellar arteries and the  anterior-inferior cerebellar arteries opacify into the capillary and venous phases. Approximately 50% stenosis is seen of the left posterior cerebral artery and the right posterior cerebral artery and its proximal P1 segment. Partial retrograde flow into the left vertebrobasilar junction to the left posterior-inferior cerebellar artery is noted from the right vertebral artery injection. The right common carotid arteriogram demonstrates mild stenosis of the proximal right external carotid artery. Its branches are normally opacified. The right internal carotid artery at the bulb to the cranial skull base demonstrates wide patency with a double U shaped tortuosity of the mid right segment of the internal carotid artery. More distally, the vessel is seen to opacify normally to the cranial skull base. The petrous, the cavernous and the supraclinoid segments are widely patent. The right middle cerebral artery and the right anterior cerebral artery opacify into the capillary and venous phases. There is a mild to moderate stenosis of the right middle cerebral artery M1 segment. Focal areas of caliber irregularity with narrowing are seen of the right callosal marginal and the right pericallosal arteries and to a lesser degree the superior division of the right middle cerebral artery. Also seen is a transient cross filling via the anterior communicating artery of the left anterior cerebral artery. A previously dislodged coil at the time of treatment of the anterior communicating artery aneurysm is seen in the right pericallosal A1 A2 junction unchanged. There is no impedance of flow through this vessel. The left common carotid arteriogram demonstrates mild stenosis at the origin of the left external carotid artery. Its branches are normally opacified. The left internal carotid artery at the bulb demonstrates a smooth shallow plaque along the posterior wall with a less than 10% stenosis by the NASCET criteria. Again  demonstrated is a U shaped configuration of the proximal 1/3 of the left internal carotid artery without evidence of kinking or stenosis. Distal to this the vessel is seen to opacify normally to the cranial skull base. The petrous segment is widely patent. There is mild fusiform dilatation of the proximal cavernous segment. The distal cavernous segment and the  supraclinoid segments of the left internal carotid artery demonstrate wide patency. The left middle cerebral artery has approximately 25-50% stenosis in the M1 segment. The distal branches are opacified. The left anterior cerebral artery demonstrates complete obliteration of the previously endovascularly coiled anterior communicating artery aneurysm. No evidence of coil compaction or recanalization is seen. Focal areas of mild narrowing are seen of the callosal marginal and the pericallosal branches of the left anterior cerebral artery. The left subclavian arteriogram demonstrates a severe high-grade approximately 90% stenosis of the origin of the left vertebral artery. This vessel demonstrates mild post stenotic dilatation. The vessel, otherwise, is seen to opacify to the cranial skull base were it supplies the left posterior inferior cerebellar artery which demonstrates focal areas of mild caliber irregularity consistent with intracranial arteriosclerosis. Flow is noted distal to this into the left vertebrobasilar junction mixing with unopacified blood in the proximal basilar artery. 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 Previously displaced small coil in the right anterior cerebral artery A1 A2 junction stable with no obstruction of blood flow. 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 Mild-to-moderate intracranial arteriosclerotic  changes involving the left posterior inferior cerebellar artery, the posterior cerebral arteries proximally, and the anterior cerebral arteries in the A1 A2 junctions as described above. PLAN: Findings were reviewed with the patient and her husband. They both will visit in consultation regarding management of the left vertebral artery origin stenosis. Electronically Signed   By: Luanne Bras M.D.   On: 01/14/2018 10:53   Ir Angio Vertebral Sel Subclavian Innominate Uni L Mod Sed  Result Date: 01/15/2018 CLINICAL DATA:  Transient vertebrobasilar ischemic symptoms of dizziness with head turning, or stooping. EXAM: BILATERAL COMMON CAROTID AND INNOMINATE ANGIOGRAPHY; IR ANGIO VERTEBRAL SEL SUBCLAVIAN INNOMINATE UNI LEFT MOD SED; IR ANGIO VERTEBRAL SEL VERTEBRAL UNI RIGHT MOD SED COMPARISON:  Diagnostic catheter arteriogram of 12/20/2015. MEDICATIONS: Heparin 1000 units IV; no antibiotic was administered within 1 hour of the procedure. ANESTHESIA/SEDATION: Versed 1 mg IV; Fentanyl 25 mcg IV. Moderate Sedation Time:  32 minutes. The patient was continuously monitored during the procedure by the interventional radiology nurse under my direct supervision. CONTRAST:  Isovue 300 approximately 60 mL. FLUOROSCOPY TIME:  Fluoroscopy Time: 7 minutes 48 seconds (1042 mGy). COMPLICATIONS: None immediate. TECHNIQUE: Informed written consent was obtained from the patient after a thorough discussion of the procedural risks, benefits and alternatives. All questions were addressed. Maximal Sterile Barrier Technique was utilized including caps, mask, sterile gowns, sterile gloves, sterile drape, hand hygiene and skin antiseptic. A timeout was performed prior to the initiation of the procedure. The right groin was prepped and draped in the usual sterile fashion. Thereafter using modified Seldinger technique, transfemoral access into the right common femoral artery was obtained without difficulty. Over a 0.035 inch guidewire, a  5 French Pinnacle sheath was inserted. Through this, and also over 0.035 inch guidewire, a 5 [place] JB 1 catheter was advanced to the aortic arch region and selectively positioned in the right common carotid artery, the right vertebral artery, the left common carotid artery and the left subclavian artery. FINDINGS: The right vertebral artery origin is widely patent. The proximal right vertebral artery demonstrates mild tortuosity distal to its origin. Otherwise, the vessel is seen to opacify normally to the cranial skull base. Wide patency is seen of the right posterior-inferior cerebellar artery and right  vertebrobasilar junction. The basilar artery, the posterior cerebral arteries, the superior cerebellar arteries and the anterior-inferior cerebellar arteries opacify into the capillary and venous phases. Approximately 50% stenosis is seen of the left posterior cerebral artery and the right posterior cerebral artery and its proximal P1 segment. Partial retrograde flow into the left vertebrobasilar junction to the left posterior-inferior cerebellar artery is noted from the right vertebral artery injection. The right common carotid arteriogram demonstrates mild stenosis of the proximal right external carotid artery. Its branches are normally opacified. The right internal carotid artery at the bulb to the cranial skull base demonstrates wide patency with a double U shaped tortuosity of the mid right segment of the internal carotid artery. More distally, the vessel is seen to opacify normally to the cranial skull base. The petrous, the cavernous and the supraclinoid segments are widely patent. The right middle cerebral artery and the right anterior cerebral artery opacify into the capillary and venous phases. There is a mild to moderate stenosis of the right middle cerebral artery M1 segment. Focal areas of caliber irregularity with narrowing are seen of the right callosal marginal and the right pericallosal arteries  and to a lesser degree the superior division of the right middle cerebral artery. Also seen is a transient cross filling via the anterior communicating artery of the left anterior cerebral artery. A previously dislodged coil at the time of treatment of the anterior communicating artery aneurysm is seen in the right pericallosal A1 A2 junction unchanged. There is no impedance of flow through this vessel. The left common carotid arteriogram demonstrates mild stenosis at the origin of the left external carotid artery. Its branches are normally opacified. The left internal carotid artery at the bulb demonstrates a smooth shallow plaque along the posterior wall with a less than 10% stenosis by the NASCET criteria. Again demonstrated is a U shaped configuration of the proximal 1/3 of the left internal carotid artery without evidence of kinking or stenosis. Distal to this the vessel is seen to opacify normally to the cranial skull base. The petrous segment is widely patent. There is mild fusiform dilatation of the proximal cavernous segment. The distal cavernous segment and the supraclinoid segments of the left internal carotid artery demonstrate wide patency. The left middle cerebral artery has approximately 25-50% stenosis in the M1 segment. The distal branches are opacified. The left anterior cerebral artery demonstrates complete obliteration of the previously endovascularly coiled anterior communicating artery aneurysm. No evidence of coil compaction or recanalization is seen. Focal areas of mild narrowing are seen of the callosal marginal and the pericallosal branches of the left anterior cerebral artery. The left subclavian arteriogram demonstrates a severe high-grade approximately 90% stenosis of the origin of the left vertebral artery. This vessel demonstrates mild post stenotic dilatation. The vessel, otherwise, is seen to opacify to the cranial skull base were it supplies the left posterior inferior cerebellar  artery which demonstrates focal areas of mild caliber irregularity consistent with intracranial arteriosclerosis. Flow is noted distal to this into the left vertebrobasilar junction mixing with unopacified blood in the proximal basilar artery. 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 Previously displaced small coil in the right anterior cerebral artery A1 A2 junction stable with no obstruction of blood flow. 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 PLAN: Findings were reviewed with the patient and her husband. They both will visit in consultation regarding management of the left vertebral artery origin stenosis. Electronically Signed   By: Luanne Bras M.D.   On: 01/14/2018 10:53   Ir Angio Vertebral Sel Vertebral Uni R Mod Sed  Result Date: 01/15/2018 CLINICAL DATA:  Transient vertebrobasilar ischemic symptoms of dizziness with head turning, or stooping. EXAM: BILATERAL COMMON CAROTID AND INNOMINATE ANGIOGRAPHY; IR ANGIO VERTEBRAL SEL SUBCLAVIAN INNOMINATE UNI LEFT MOD SED; IR ANGIO VERTEBRAL SEL VERTEBRAL UNI RIGHT MOD SED COMPARISON:  Diagnostic catheter arteriogram of 12/20/2015. MEDICATIONS: Heparin 1000 units IV; no antibiotic was administered within 1 hour of the procedure. ANESTHESIA/SEDATION: Versed 1 mg IV; Fentanyl 25 mcg IV. Moderate Sedation Time:  32 minutes. The patient was continuously monitored during the procedure by the interventional radiology nurse under my direct supervision. CONTRAST:  Isovue 300 approximately 60 mL. FLUOROSCOPY TIME:   Fluoroscopy Time: 7 minutes 48 seconds (1042 mGy). COMPLICATIONS: None immediate. TECHNIQUE: Informed written consent was obtained from the patient after a thorough discussion of the procedural risks, benefits and alternatives. All questions were addressed. Maximal Sterile Barrier Technique was utilized including caps, mask, sterile gowns, sterile gloves, sterile drape, hand hygiene and skin antiseptic. A timeout was performed prior to the initiation of the procedure. The right groin was prepped and draped in the usual sterile fashion. Thereafter using modified Seldinger technique, transfemoral access into the right common femoral artery was obtained without difficulty. Over a 0.035 inch guidewire, a 5 French Pinnacle sheath was inserted. Through this, and also over 0.035 inch guidewire, a 5 [place] JB 1 catheter was advanced to the aortic arch region and selectively positioned in the right common carotid artery, the right vertebral artery, the left common carotid artery and the left subclavian artery. FINDINGS: The right vertebral artery origin is widely patent. The proximal right vertebral artery demonstrates mild tortuosity distal to its origin. Otherwise, the vessel is seen to opacify normally to the cranial skull base. Wide patency is seen of the right posterior-inferior cerebellar artery and right vertebrobasilar junction. The basilar artery, the posterior cerebral arteries, the superior cerebellar arteries and the anterior-inferior cerebellar arteries opacify into the capillary and venous phases. Approximately 50% stenosis is seen of the left posterior cerebral artery and the right posterior cerebral artery and its proximal P1 segment. Partial retrograde flow into the left vertebrobasilar junction to the left posterior-inferior cerebellar artery is noted from the right vertebral artery injection. The right common carotid arteriogram demonstrates mild stenosis of the proximal right external carotid artery. Its  branches are normally opacified. The right internal carotid artery at the bulb to the cranial skull base demonstrates wide patency with a double U shaped tortuosity of the mid right segment of the internal carotid artery. More distally, the vessel is seen to opacify normally to the cranial skull base. The petrous, the cavernous and the supraclinoid segments are widely patent. The right middle cerebral artery and the right anterior cerebral artery opacify into the capillary and venous phases. There is a mild to moderate stenosis of the right middle cerebral artery M1 segment. Focal areas of caliber irregularity with narrowing are seen of the right callosal marginal and the right pericallosal arteries and to a lesser degree the superior division of the right middle cerebral artery. Also seen is a transient cross filling via the anterior communicating artery of the  left anterior cerebral artery. A previously dislodged coil at the time of treatment of the anterior communicating artery aneurysm is seen in the right pericallosal A1 A2 junction unchanged. There is no impedance of flow through this vessel. The left common carotid arteriogram demonstrates mild stenosis at the origin of the left external carotid artery. Its branches are normally opacified. The left internal carotid artery at the bulb demonstrates a smooth shallow plaque along the posterior wall with a less than 10% stenosis by the NASCET criteria. Again demonstrated is a U shaped configuration of the proximal 1/3 of the left internal carotid artery without evidence of kinking or stenosis. Distal to this the vessel is seen to opacify normally to the cranial skull base. The petrous segment is widely patent. There is mild fusiform dilatation of the proximal cavernous segment. The distal cavernous segment and the supraclinoid segments of the left internal carotid artery demonstrate wide patency. The left middle cerebral artery has approximately 25-50% stenosis in  the M1 segment. The distal branches are opacified. The left anterior cerebral artery demonstrates complete obliteration of the previously endovascularly coiled anterior communicating artery aneurysm. No evidence of coil compaction or recanalization is seen. Focal areas of mild narrowing are seen of the callosal marginal and the pericallosal branches of the left anterior cerebral artery. The left subclavian arteriogram demonstrates a severe high-grade approximately 90% stenosis of the origin of the left vertebral artery. This vessel demonstrates mild post stenotic dilatation. The vessel, otherwise, is seen to opacify to the cranial skull base were it supplies the left posterior inferior cerebellar artery which demonstrates focal areas of mild caliber irregularity consistent with intracranial arteriosclerosis. Flow is noted distal to this into the left vertebrobasilar junction mixing with unopacified blood in the proximal basilar artery. 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 Previously displaced small coil in the right anterior cerebral artery A1 A2 junction stable with no obstruction of blood flow. 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 PLAN: Findings were reviewed with the patient and her husband. They both will visit in consultation regarding management of the left vertebral artery origin stenosis. Electronically Signed   By: Luanne Bras M.D.   On: 01/14/2018 10:53    Labs:  CBC: Recent Labs    06/17/17 0819 10/21/17 1002 01/14/18 0650  WBC  5.7 6.0 8.4  HGB 12.3 12.4 11.7*  HCT 38.5 37.1 36.7  PLT 348 338 309    COAGS: Recent Labs    01/14/18 0650  INR 1.13    BMP: Recent Labs    06/17/17 0819 10/21/17 1002 01/14/18 0650  NA 139 138 137  K 3.9 4.0 3.5  CL 96 100 102  CO2 23 22 25   GLUCOSE 175* 127* 140*  BUN 7* 8 12  CALCIUM 10.3 10.4* 9.6  CREATININE 0.75 0.76 0.77  GFRNONAA 79 78 >60  GFRAA 91 89 >60    LIVER FUNCTION TESTS: Recent Labs    06/17/17 0819 10/21/17 1002  BILITOT 0.5 0.5  AST 56* 50*  ALT 44* 45*  ALKPHOS 91 106  PROT 7.1 7.2  ALBUMIN 4.3 4.6    TUMOR MARKERS: No results for input(s): AFPTM, CEA, CA199, CHROMGRNA in the  last 8760 hours.  Assessment and Plan:  Left vertebral artery stenosis. Reviewed imaging with patient and husband. Explained to patient that since she is not symptomatic from her left vertebral artery stenosis, management options include observation with routine imaging scans or diagnostic cerebral angiograms. Patient states she wishes to pursue with a diagnostic angiogram, but asks that we wait until August 2019 for procedure.  Plan for follow-up with image-guided diagnostic cerebral angiogram in August 2019. Informed patient that our schedulers will call her to set up this appointment. Instructed patient to continue taking Plavix 75 mg once daily and Aspirin 325 mg once daily- prescription written for Plavix 75 mg.  All questions answered and concerns addressed. Patient and husband convey understanding and agree with plan.  Thank you for this interesting consult.  I greatly enjoyed meeting DESARIE FEILD and look forward to participating in their care.  A copy of this report was sent to the requesting provider on this date.  Electronically Signed: Earley Abide, PA-C 02/02/2018, 9:06 AM   I spent a total of 25 Minutes in face to face in clinical consultation, greater than 50% of which was counseling/coordinating care for left vertebral artery stenosis.

## 2018-02-07 ENCOUNTER — Other Ambulatory Visit: Payer: Self-pay | Admitting: Family Medicine

## 2018-02-11 ENCOUNTER — Other Ambulatory Visit: Payer: Self-pay | Admitting: Family Medicine

## 2018-02-23 ENCOUNTER — Ambulatory Visit (INDEPENDENT_AMBULATORY_CARE_PROVIDER_SITE_OTHER): Payer: PPO | Admitting: Family Medicine

## 2018-02-23 ENCOUNTER — Encounter: Payer: Self-pay | Admitting: Family Medicine

## 2018-02-23 VITALS — BP 126/63 | HR 79 | Temp 97.7°F | Ht 62.0 in | Wt 191.0 lb

## 2018-02-23 DIAGNOSIS — I2510 Atherosclerotic heart disease of native coronary artery without angina pectoris: Secondary | ICD-10-CM | POA: Diagnosis not present

## 2018-02-23 DIAGNOSIS — I700 Atherosclerosis of aorta: Secondary | ICD-10-CM | POA: Diagnosis not present

## 2018-02-23 DIAGNOSIS — E559 Vitamin D deficiency, unspecified: Secondary | ICD-10-CM

## 2018-02-23 DIAGNOSIS — K219 Gastro-esophageal reflux disease without esophagitis: Secondary | ICD-10-CM

## 2018-02-23 DIAGNOSIS — E079 Disorder of thyroid, unspecified: Secondary | ICD-10-CM

## 2018-02-23 DIAGNOSIS — E118 Type 2 diabetes mellitus with unspecified complications: Secondary | ICD-10-CM

## 2018-02-23 DIAGNOSIS — E1142 Type 2 diabetes mellitus with diabetic polyneuropathy: Secondary | ICD-10-CM | POA: Diagnosis not present

## 2018-02-23 DIAGNOSIS — I10 Essential (primary) hypertension: Secondary | ICD-10-CM | POA: Diagnosis not present

## 2018-02-23 DIAGNOSIS — J449 Chronic obstructive pulmonary disease, unspecified: Secondary | ICD-10-CM

## 2018-02-23 DIAGNOSIS — E7849 Other hyperlipidemia: Secondary | ICD-10-CM | POA: Diagnosis not present

## 2018-02-23 LAB — BAYER DCA HB A1C WAIVED: HB A1C (BAYER DCA - WAIVED): 6.5 % (ref <7.0)

## 2018-02-23 MED ORDER — PRAVASTATIN SODIUM 10 MG PO TABS: 10.0000 mg | ORAL_TABLET | ORAL | 3 refills | Status: DC

## 2018-02-23 NOTE — Progress Notes (Signed)
Subjective:    Patient ID: Elizabeth [name], female    DOB: February 27, 1941, 77 y.o.   MRN: [id number]  HPI  Pt here for follow up and management of chronic medical problems which includes diabetes, hypertension and hyperlipidemia. She is taking medication regularly.  Patient is doing well today with no specific complaints and she is not needing any refills.  She is due to get a mammogram and will be given an FOBT to return which she says she already has one at home.  She has had her lab work done today.  Her vital signs are stable.  This patient has been intolerant to statins in the past.  She is diabetic and we will ask her to at least try taking 1 pill of Crestor at lower strength once a week on a regular basis to satisfy the protocol of being diabetic and being on a statin drug.  The patient has not followed through with getting another colonoscopy and she does not want to get one.  She says she has had polyps in the past.  I am not sure if this would qualify her for a Cologuard or not and we will look into that.  She denies any chest pain pressure or tightness.  She does have some shortness of breath but associates this with her allergy and sinus drainage.  She also says this complicates her swallowing issues.  She is currently using nasal saline Rhinocort and as needed Astelin nasal spray.  Her bowel habits have not changed and she has not seen any blood in the stool or had any black tarry bowel movements.  She is passing her water without problems.  She is due to get an eye exam next week with Dr. Radford Pax.    Patient Active Problem List   Diagnosis Date Noted  . Stroke ([place])   . Elevated liver function tests 10/22/2017  . Anxiety and depression 10/21/2017  . Abdominal aortic atherosclerosis ([place]) 10/21/2017  . Type 2 diabetes mellitus with hyperlipidemia ([place]) 09/06/2015  . Type 2 diabetes mellitus with peripheral neuropathy ([place]) 11/27/2014  . CVA (cerebral vascular accident) ([place]) 02/27/2012  .  Cerebral aneurysm 02/27/2012  . 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 Gait instability 02/27/2012  . Vision disturbance following cerebrovascular accident 02/27/2012  . Dyspnea 11/14/2011  . Thyroid disease 10/12/2008  . Diabetes ([place]) 08/29/2008  . Hyperlipidemia 08/29/2008  . OBESITY, UNSPECIFIED 08/29/2008  . COPD (chronic obstructive pulmonary disease) ([place]) 08/29/2008  . WEIGHT LOSS 08/29/2008  . DIARRHEA 08/29/2008  . HTN (hypertension) 08/28/2008  . ASTHMA 08/28/2008  . ESOPHAGEAL STRICTURE 08/28/2008  . GERD 08/28/2008  . HIATAL HERNIA 08/28/2008  . DIVERTICULOSIS, COLON 08/28/2008  . COLONIC POLYPS, HX OF 08/28/2008   Outpatient Encounter Medications as of 02/23/2018  Medication Sig  . amLODipine (NORVASC) 10 MG tablet TAKE ONE TABLET BY MOUTH DAILY  . aspirin 325 MG EC tablet Take 325 mg by mouth daily.  . Carboxymethylcellulose Sodium (REFRESH LIQUIGEL) 1 % GEL Place 1 drop into both eyes 2 (two) times daily as needed (dry eyes).  . CINNAMON PO Take 2,000 mg by mouth daily.  . clopidogrel (PLAVIX) 75 MG tablet TAKE ONE TABLET BY MOUTH DAILY (PLEASE USE AUROBINDO BRAND, PT HD FOR ALLERGY TO [place]DY)  . escitalopram (LEXAPRO) 10 MG tablet TAKE ONE TABLET BY MOUTH EVERY DAY  . glucose blood test strip Check BS BID and PRN. DX E11.9  . hydrochlorothiazide (MICROZIDE) 12.5 MG capsule TAKE 1 CAPSULE  BY MOUTH EVERY DAY  . JANUVIA 50 MG tablet TAKE ONE TABLET BY MOUTH DAILY  . metFORMIN (GLUCOPHAGE) 1000 MG tablet TAKE ONE TABLET BY MOUTH EVERY MORNING WITH BREAKFAST and TAKE 1/2 TABLET EVERY EVENING WITH FOOD  . metoCLOPramide (REGLAN) 10 MG tablet TAKE ONE TABLET BY MOUTH 1 TIME DAILY AS NEEDED (Patient taking differently: Take 10 mg by mouth daily as needed for nausea. TAKE ONE TABLET BY MOUTH 1 TIME DAILY AS NEEDED)  . montelukast (SINGULAIR) 5 MG chewable tablet chew TWO tablets BY MOUTH AT BEDTIME (Patient taking differently: chew ONE tablets BY MOUTH AT BEDTIME)  .  pantoprazole (PROTONIX) 40 MG tablet TAKE 1 TABLET BY MOUTH EVERY DAY --FILL ONLY WHEN PATIENT REQUESTS 03/14/2016 KR--  . PROAIR HFA 108 (90 Base) MCG/ACT inhaler INHALE 2 PUFFS EVERY SIX HOURS AS NEEDED FOR WHEEZING OR SHORTNESS OF BREATH  . Vitamin D, Ergocalciferol, (DRISDOL) 50000 units CAPS capsule TAKE 1 CAPSULE BY MOUTH EVERY SEVEN DAYS  . [DISCONTINUED] cyproheptadine (PERIACTIN) 4 MG tablet Take 1 tablet (4 mg total) by mouth 3 (three) times daily as needed for allergies. (Patient taking differently: Take 4 mg by mouth daily. )  . [DISCONTINUED] Olopatadine HCl (PATADAY) 0.2 % SOLN Place 1 drop into both eyes 2 (two) times daily as needed (eye allergies).  . [DISCONTINUED] ramipril (ALTACE) 10 MG capsule Take 10 mg by mouth daily.     No facility-administered encounter medications on [name] as of 02/23/2018.      Review of Systems  Constitutional: Negative.   HENT: Negative.   Eyes: Negative.   Respiratory: Negative.   Cardiovascular: Negative.   Gastrointestinal: Negative.   Endocrine: Negative.   Genitourinary: Negative.   Musculoskeletal: Negative.   Skin: Negative.   Allergic/Immunologic: Negative.   Neurological: Negative.   Hematological: Negative.   Psychiatric/Behavioral: Negative.        Objective:   Physical Exam  Constitutional: She is oriented to person, place, and time. She appears well-developed and well-nourished.  Patient is pleasant and alert and has had additional procedures for studying the circulation in her brain and has been seeing Dr. Patrecia Pour.  She will get additional studies in August  HENT:  Head: Normocephalic and atraumatic.  Right Ear: External ear normal.  Left Ear: External ear normal.  Nose: Nose normal.  Mouth/Throat: Oropharynx is clear and moist. No oropharyngeal exudate.  Eyes: Pupils are equal, round, and reactive to light. Conjunctivae and EOM are normal. Right eye exhibits no discharge. Left eye exhibits no discharge. No scleral  icterus.  Patient has upcoming eye exam next week with Dr. Radford Pax  Neck: Normal range of motion. Neck supple. No thyromegaly present.  No bruits thyromegaly or anterior cervical adenopathy  Cardiovascular: Normal rate, regular rhythm, normal heart sounds and intact distal pulses.  No murmur heard. Heart is regular at 84/min  Pulmonary/Chest: Effort normal and breath sounds normal. She has no wheezes. She has no rales.  Clear anteriorly and posteriorly  Abdominal: Soft. Bowel sounds are normal. She exhibits no mass. There is no tenderness.  Musculoskeletal: Normal range of motion. She exhibits no edema.  Lymphadenopathy:    She has no cervical adenopathy.  Neurological: She is alert and oriented to person, place, and time. She has normal reflexes. No cranial nerve deficit.  Skin: Skin is warm and dry. No rash noted.  Psychiatric: She has a normal mood and affect. Her behavior is normal. Judgment and thought content normal.  Patient is pleasant and alert  Nursing  note and vitals reviewed.  BP 126/63 (BP Location: Left Arm)   Pulse 79   Temp 97.7 F (36.5 C) (Oral)   Ht '5\' 2"'  (1.575 m)   Wt 191 lb (86.6 kg)   BMI 34.93 kg/m         Assessment & Plan:  1. Type 2 diabetes mellitus with peripheral neuropathy (HCC) -10 you with current treatment pending results of lab work - BMP8+EGFR - CBC with Differential/Platelet - [facility] [place] Hb A1c Waived  2. Essential hypertension -The blood pressure is good today and she will continue with current treatment - BMP8+EGFR - CBC with Differential/Platelet - Hepatic function panel  3. Other hyperlipidemia -Start pravastatin low-dose once weekly to satisfy protocol because of diabetes. - CBC with Differential/Platelet - Lipid panel  4. Gastroesophageal reflux disease, esophagitis presence not specified -Continue to watch diet closely and continue with pantoprazole. - CBC with Differential/Platelet  5. Vitamin D deficiency -Tinea with  vitamin D replacement pending results of lab work - CBC with Differential/Platelet - VITAMIN D 25 Hydroxy (Vit-D Deficiency, Fractures)  6. Thyroid disease - CBC with Differential/Platelet  7. ASCVD (arteriosclerotic cardiovascular disease) -Start pravastatin and take as directed along with working with diet. - CBC with Differential/Platelet - Lipid panel  8. Chronic obstructive pulmonary disease, unspecified COPD type ([place]) -T need to treat allergy situation with head congestion using as needed Astelin and regular use of Rhinocort and nasal saline  9. Abdominal aortic atherosclerosis ([place]) -Start pravastatin as directed  10. Controlled diabetes mellitus type 2 with complications, unspecified whether long term insulin use ([place]) -Continue with aggressive therapeutic lifestyle changes and current treatment for cholesterol  11. Controlled type 2 diabetes mellitus with complication, without long-term current use of insulin ([place]) -As above  Meds ordered this encounter  Medications  . pravastatin (PRAVACHOL) 10 MG tablet    Sig: Take 1 tablet (10 mg total) by mouth once a week.    Dispense:  12 tablet    Refill:  3   Patient Instructions                       Medicare Annual Wellness Visit  [facility] and the medical providers at [facility] strive to bring you the best medical care.  In doing so we not only want to address your current medical conditions and concerns but also to detect new conditions early and prevent illness, disease and health-related problems.    Medicare offers a yearly Wellness Visit which allows our clinical staff to assess your need for preventative services including immunizations, lifestyle education, counseling to decrease risk of preventable diseases and screening for fall risk and other medical concerns.    This visit is provided free of charge (no copay) for all Medicare recipients. The clinical pharmacists at [facility] have begun to conduct these Wellness Visits which will also include a thorough review of all your medications.    As you primary medical provider recommend that you make an appointment for your Annual Wellness Visit if you have not done so already this year.  You may set up this appointment before you leave today or you may call back ([phone]) and schedule an appointment.  Please make sure when you call that you mention that you are scheduling your Annual Wellness Visit with the clinical pharmacist so that the appointment may be made for the proper length of time.     Continue current medications. Continue  good therapeutic lifestyle changes which include good diet and exercise. Fall precautions discussed with patient. If an FOBT was given today- please return it to our front desk. If you are over 49 years old - you may need Prevnar 41 or the adult Pneumonia vaccine.  **Flu shots are available--- please call and schedule a FLU-CLINIC appointment**  After your visit with [place] today you will receive a survey in the mail or online from [employer] regarding your care with [place]. Please take a moment to fill this out. Your feedback is very important to [place] as you can help [place] better understand your patient needs as well as improve your experience and satisfaction. WE CARE ABOUT YOU!!!   Call and make an appointment for a colonoscopy with Dr. Jearld Adjutant Continue to drink plenty of fluids and follow aggressive therapeutic lifestyle changes to help with cholesterol and to help lose weight Take pravastatin 1/2 pill once a week Plenty of fluids and stay well-hydrated  Arrie Senate MD

## 2018-02-23 NOTE — Patient Instructions (Addendum)
Medicare Annual Wellness Visit  Elizabeth [name] and the medical providers at [facility] strive to bring you the best medical care.  In doing so we not only want to address your current medical conditions and concerns but also to detect new conditions early and prevent illness, disease and health-related problems.    Medicare offers a yearly Wellness Visit which allows our clinical staff to assess your need for preventative services including immunizations, lifestyle education, counseling to decrease risk of preventable diseases and screening for fall risk and other medical concerns.    This visit is provided free of charge (no copay) for all Medicare recipients. The clinical pharmacists at [facility] have begun to conduct these Wellness Visits which will also include a thorough review of all your medications.    As you primary medical provider recommend that you make an appointment for your Annual Wellness Visit if you have not done so already this year.  You may set up this appointment before you leave today or you may call back ([phone]) and schedule an appointment.  Please make sure when you call that you mention that you are scheduling your Annual Wellness Visit with the clinical pharmacist so that the appointment may be made for the proper length of time.     Continue current medications. Continue good therapeutic lifestyle changes which include good diet and exercise. Fall precautions discussed with patient. If an FOBT was given today- please return it to our front desk. If you are over 73 years old - you may need Prevnar 61 or the adult Pneumonia vaccine.  **Flu shots are available--- please call and schedule a FLU-CLINIC appointment**  After your visit with [place] today you will receive a survey in the mail or online from [employer] regarding your care with [place]. Please take a moment to fill this out. Your feedback is very  important to [place] as you can help [place] better understand your patient needs as well as improve your experience and satisfaction. WE CARE ABOUT YOU!!!   Call and make an appointment for a colonoscopy with Dr. Jearld Adjutant Continue to drink plenty of fluids and follow aggressive therapeutic lifestyle changes to help with cholesterol and to help lose weight Take pravastatin 1/2 pill once a week Plenty of fluids and stay well-hydrated

## 2018-02-24 ENCOUNTER — Other Ambulatory Visit: Payer: Self-pay | Admitting: Family Medicine

## 2018-02-24 ENCOUNTER — Telehealth: Payer: Self-pay | Admitting: Family Medicine

## 2018-02-24 DIAGNOSIS — Z1231 Encounter for screening mammogram for malignant neoplasm of breast: Secondary | ICD-10-CM

## 2018-02-24 LAB — BMP8+EGFR
BUN/Creatinine Ratio: 10 — ABNORMAL LOW (ref 12–28)
BUN: 8 mg/dL (ref 8–27)
CO2: 25 mmol/L (ref 20–29)
Calcium: 10.1 mg/dL (ref 8.7–10.3)
Chloride: 97 mmol/L (ref 96–106)
Creatinine, Ser: 0.81 mg/dL (ref 0.57–1.00)
GFR calc Af Amer: 82 mL/min/{1.73_m2} (ref >59)
GFR calc non Af Amer: 71 mL/min/{1.73_m2} (ref >59)
Glucose: 162 mg/dL — ABNORMAL HIGH (ref 65–99)
Potassium: 4.1 mmol/L (ref 3.5–5.2)
Sodium: 135 mmol/L (ref 134–144)

## 2018-02-24 LAB — VITAMIN D 25 HYDROXY (VIT D DEFICIENCY, FRACTURES): Vit D, 25-Hydroxy: 24.6 ng/mL — ABNORMAL LOW (ref 30.0–100.0)

## 2018-02-24 LAB — CBC WITH DIFFERENTIAL/PLATELET
Basophils Absolute: 0.1 10*3/uL (ref 0.0–0.2)
Basos: 1 %
EOS (ABSOLUTE): 0.2 10*3/uL (ref 0.0–0.4)
Eos: 2 %
Hematocrit: 35.5 % (ref 34.0–46.6)
Hemoglobin: 11.6 g/dL (ref 11.1–15.9)
Immature Grans (Abs): 0 10*3/uL (ref 0.0–0.1)
Immature Granulocytes: 0 %
Lymphocytes Absolute: 1.8 10*3/uL (ref 0.7–3.1)
Lymphs: 27 %
MCH: 25.8 pg — ABNORMAL LOW (ref 26.6–33.0)
MCHC: 32.7 g/dL (ref 31.5–35.7)
MCV: 79 fL (ref 79–97)
Monocytes Absolute: 0.4 10*3/uL (ref 0.1–0.9)
Monocytes: 7 %
Neutrophils Absolute: 4.2 10*3/uL (ref 1.4–7.0)
Neutrophils: 63 %
Platelets: 332 10*3/uL (ref 150–450)
RBC: 4.5 x10E6/uL (ref 3.77–5.28)
RDW: 15.1 % (ref 12.3–15.4)
WBC: 6.6 10*3/uL (ref 3.4–10.8)

## 2018-02-24 LAB — LIPID PANEL
Chol/HDL Ratio: 5.6 ratio — ABNORMAL HIGH (ref 0.0–4.4)
Cholesterol, Total: 234 mg/dL — ABNORMAL HIGH (ref 100–199)
HDL: 42 mg/dL (ref >39)
LDL Calculated: 167 mg/dL — ABNORMAL HIGH (ref 0–99)
Triglycerides: 127 mg/dL (ref 0–149)
VLDL Cholesterol Cal: 25 mg/dL (ref 5–40)

## 2018-02-24 LAB — HEPATIC FUNCTION PANEL
ALT: 46 IU/L — ABNORMAL HIGH (ref 0–32)
AST: 39 IU/L (ref 0–40)
Albumin: 4.2 g/dL (ref 3.5–4.8)
Alkaline Phosphatase: 105 IU/L (ref 39–117)
Bilirubin Total: 0.5 mg/dL (ref 0.0–1.2)
Bilirubin, Direct: 0.18 mg/dL (ref 0.00–0.40)
Total Protein: 7.1 g/dL (ref 6.0–8.5)

## 2018-02-24 MED ORDER — VITAMIN D (ERGOCALCIFEROL) 1.25 MG (50000 UNIT) PO CAPS: ORAL_CAPSULE | ORAL | 3 refills | Status: DC

## 2018-02-24 NOTE — Telephone Encounter (Signed)
Noted - vit d sent in

## 2018-03-01 ENCOUNTER — Encounter (HOSPITAL_COMMUNITY): Payer: Self-pay

## 2018-03-01 ENCOUNTER — Ambulatory Visit (HOSPITAL_COMMUNITY)
Admission: RE | Admit: 2018-03-01 | Discharge: 2018-03-01 | Disposition: A | Discharge status: Home / Self Care | Payer: PPO | Source: Ambulatory Visit | Attending: Family Medicine | Admitting: Family Medicine

## 2018-03-01 ENCOUNTER — Ambulatory Visit (HOSPITAL_COMMUNITY): Payer: PPO

## 2018-03-01 DIAGNOSIS — Z1231 Encounter for screening mammogram for malignant neoplasm of breast: Secondary | ICD-10-CM | POA: Diagnosis not present

## 2018-03-04 ENCOUNTER — Telehealth: Payer: Self-pay | Admitting: Family Medicine

## 2018-03-04 MED ORDER — AZITHROMYCIN 250 MG PO TABS: ORAL_TABLET | ORAL | 0 refills | Status: DC

## 2018-03-04 MED ORDER — PREDNISONE 10 MG PO TABS: ORAL_TABLET | ORAL | 0 refills | Status: DC

## 2018-03-04 NOTE — Telephone Encounter (Signed)
Pt is hoarse, cough - worse at night, yellow-brown congestion, low gd fever.  [place]

## 2018-03-04 NOTE — Telephone Encounter (Signed)
Please start this patient on a Z-Pak with an 8-day prednisone taper and take Mucinex, plain, maximum strength 1 twice daily for cough and congestion with a large glass of water

## 2018-03-04 NOTE — Telephone Encounter (Signed)
Pt aware and meds sent in

## 2018-03-10 ENCOUNTER — Telehealth: Payer: Self-pay | Admitting: *Deleted

## 2018-03-10 MED ORDER — ALBUTEROL SULFATE HFA 108 (90 BASE) MCG/ACT IN AERS
INHALATION_SPRAY | RESPIRATORY_TRACT | 2 refills | Status: DC
Start: 2018-03-10 — End: 2019-09-05

## 2018-03-10 MED ORDER — HYDROCHLOROTHIAZIDE 12.5 MG PO CAPS: ORAL_CAPSULE | ORAL | 1 refills | Status: DC

## 2018-03-10 MED ORDER — PRAVASTATIN SODIUM 10 MG PO TABS: 10.0000 mg | ORAL_TABLET | ORAL | 3 refills | Status: DC

## 2018-03-10 MED ORDER — CLOPIDOGREL BISULFATE 75 MG PO TABS
ORAL_TABLET | ORAL | 1 refills | Status: DC
Start: 2018-03-10 — End: 2018-08-09

## 2018-03-10 MED ORDER — AMLODIPINE BESYLATE 10 MG PO TABS: 10.0000 mg | ORAL_TABLET | Freq: Every day | ORAL | 1 refills | Status: DC

## 2018-03-10 MED ORDER — PANTOPRAZOLE SODIUM 40 MG PO TBEC
DELAYED_RELEASE_TABLET | ORAL | 1 refills | Status: DC
Start: 2018-03-10 — End: 2018-08-18

## 2018-03-10 MED ORDER — ESCITALOPRAM OXALATE 10 MG PO TABS: 10.0000 mg | ORAL_TABLET | Freq: Every day | ORAL | 0 refills | Status: DC

## 2018-03-10 MED ORDER — MONTELUKAST SODIUM 5 MG PO CHEW: CHEWABLE_TABLET | ORAL | 2 refills | Status: DC

## 2018-03-10 NOTE — Telephone Encounter (Signed)
Incoming call from pt stating she is changing pharmacy  To [facility] in [place] Requesting refill on meds meds sent in per pt request Okayed per Dr Laurance Flatten

## 2018-04-02 ENCOUNTER — Other Ambulatory Visit: Payer: Self-pay | Admitting: Family Medicine

## 2018-04-22 ENCOUNTER — Telehealth (HOSPITAL_COMMUNITY): Payer: Self-pay

## 2018-04-22 NOTE — Telephone Encounter (Signed)
Called to schedule f/u angio, no answer, left vm. AW 

## 2018-04-26 ENCOUNTER — Telehealth: Payer: Self-pay | Admitting: Family Medicine

## 2018-04-26 NOTE — Telephone Encounter (Signed)
Left message to call back  

## 2018-04-28 ENCOUNTER — Telehealth (HOSPITAL_COMMUNITY): Payer: Self-pay

## 2018-04-28 ENCOUNTER — Other Ambulatory Visit (HOSPITAL_COMMUNITY): Payer: Self-pay | Admitting: Interventional Radiology

## 2018-04-28 DIAGNOSIS — I729 Aneurysm of unspecified site: Secondary | ICD-10-CM

## 2018-04-28 DIAGNOSIS — I771 Stricture of artery: Secondary | ICD-10-CM

## 2018-04-28 DIAGNOSIS — I639 Cerebral infarction, unspecified: Secondary | ICD-10-CM

## 2018-04-28 DIAGNOSIS — R42 Dizziness and giddiness: Secondary | ICD-10-CM

## 2018-04-28 NOTE — Telephone Encounter (Signed)
Called to schedule angio, no answer, left vm. AW  

## 2018-05-06 ENCOUNTER — Other Ambulatory Visit: Payer: Self-pay | Admitting: Radiology

## 2018-05-11 ENCOUNTER — Encounter (HOSPITAL_COMMUNITY): Payer: Self-pay

## 2018-05-11 ENCOUNTER — Other Ambulatory Visit (HOSPITAL_COMMUNITY): Payer: Self-pay | Admitting: Interventional Radiology

## 2018-05-11 ENCOUNTER — Ambulatory Visit (HOSPITAL_COMMUNITY)
Admission: RE | Admit: 2018-05-11 | Discharge: 2018-05-11 | Disposition: A | Discharge status: Home / Self Care | Payer: PPO | Source: Ambulatory Visit | Attending: Interventional Radiology | Admitting: Interventional Radiology

## 2018-05-11 DIAGNOSIS — I10 Essential (primary) hypertension: Secondary | ICD-10-CM | POA: Insufficient documentation

## 2018-05-11 DIAGNOSIS — Z7984 Long term (current) use of oral hypoglycemic drugs: Secondary | ICD-10-CM | POA: Insufficient documentation

## 2018-05-11 DIAGNOSIS — G450 Vertebro-basilar artery syndrome: Secondary | ICD-10-CM | POA: Insufficient documentation

## 2018-05-11 DIAGNOSIS — Z880 Allergy status to penicillin: Secondary | ICD-10-CM | POA: Insufficient documentation

## 2018-05-11 DIAGNOSIS — I771 Stricture of artery: Secondary | ICD-10-CM

## 2018-05-11 DIAGNOSIS — I639 Cerebral infarction, unspecified: Secondary | ICD-10-CM

## 2018-05-11 DIAGNOSIS — Z882 Allergy status to sulfonamides status: Secondary | ICD-10-CM | POA: Insufficient documentation

## 2018-05-11 DIAGNOSIS — Z7902 Long term (current) use of antithrombotics/antiplatelets: Secondary | ICD-10-CM | POA: Diagnosis not present

## 2018-05-11 DIAGNOSIS — R42 Dizziness and giddiness: Secondary | ICD-10-CM

## 2018-05-11 DIAGNOSIS — E669 Obesity, unspecified: Secondary | ICD-10-CM | POA: Insufficient documentation

## 2018-05-11 DIAGNOSIS — J449 Chronic obstructive pulmonary disease, unspecified: Secondary | ICD-10-CM | POA: Insufficient documentation

## 2018-05-11 DIAGNOSIS — F419 Anxiety disorder, unspecified: Secondary | ICD-10-CM | POA: Insufficient documentation

## 2018-05-11 DIAGNOSIS — I729 Aneurysm of unspecified site: Secondary | ICD-10-CM

## 2018-05-11 DIAGNOSIS — Z885 Allergy status to narcotic agent status: Secondary | ICD-10-CM | POA: Insufficient documentation

## 2018-05-11 DIAGNOSIS — I671 Cerebral aneurysm, nonruptured: Secondary | ICD-10-CM | POA: Diagnosis not present

## 2018-05-11 DIAGNOSIS — I6502 Occlusion and stenosis of left vertebral artery: Secondary | ICD-10-CM | POA: Diagnosis not present

## 2018-05-11 DIAGNOSIS — Z91040 Latex allergy status: Secondary | ICD-10-CM | POA: Diagnosis not present

## 2018-05-11 DIAGNOSIS — Z6834 Body mass index (BMI) 34.0-34.9, adult: Secondary | ICD-10-CM | POA: Insufficient documentation

## 2018-05-11 DIAGNOSIS — K219 Gastro-esophageal reflux disease without esophagitis: Secondary | ICD-10-CM | POA: Insufficient documentation

## 2018-05-11 DIAGNOSIS — F329 Major depressive disorder, single episode, unspecified: Secondary | ICD-10-CM | POA: Diagnosis not present

## 2018-05-11 DIAGNOSIS — Z8673 Personal history of transient ischemic attack (TIA), and cerebral infarction without residual deficits: Secondary | ICD-10-CM | POA: Diagnosis not present

## 2018-05-11 DIAGNOSIS — Z7982 Long term (current) use of aspirin: Secondary | ICD-10-CM | POA: Diagnosis not present

## 2018-05-11 DIAGNOSIS — E079 Disorder of thyroid, unspecified: Secondary | ICD-10-CM | POA: Diagnosis not present

## 2018-05-11 DIAGNOSIS — F17210 Nicotine dependence, cigarettes, uncomplicated: Secondary | ICD-10-CM | POA: Diagnosis not present

## 2018-05-11 DIAGNOSIS — E119 Type 2 diabetes mellitus without complications: Secondary | ICD-10-CM | POA: Insufficient documentation

## 2018-05-11 DIAGNOSIS — I668 Occlusion and stenosis of other cerebral arteries: Secondary | ICD-10-CM | POA: Diagnosis not present

## 2018-05-11 HISTORY — PX: IR ANGIO INTRA EXTRACRAN SEL COM CAROTID INNOMINATE BILAT MOD SED: IMG5360

## 2018-05-11 HISTORY — PX: IR ANGIO VERTEBRAL SEL SUBCLAVIAN INNOMINATE UNI L MOD SED: IMG5364

## 2018-05-11 HISTORY — PX: IR ANGIO VERTEBRAL SEL VERTEBRAL UNI R MOD SED: IMG5368

## 2018-05-11 LAB — BASIC METABOLIC PANEL
Anion gap: 11 (ref 5–15)
BUN: 8 mg/dL (ref 8–23)
CO2: 27 mmol/L (ref 22–32)
Calcium: 9.1 mg/dL (ref 8.9–10.3)
Chloride: 96 mmol/L — ABNORMAL LOW (ref 98–111)
Creatinine, Ser: 0.76 mg/dL (ref 0.44–1.00)
GFR calc Af Amer: >60 mL/min (ref >60)
GFR calc non Af Amer: >60 mL/min (ref >60)
Glucose, Bld: 250 mg/dL — ABNORMAL HIGH (ref 70–99)
Potassium: 3.3 mmol/L — ABNORMAL LOW (ref 3.5–5.1)
Sodium: 134 mmol/L — ABNORMAL LOW (ref 135–145)

## 2018-05-11 LAB — PROTIME-INR
INR: 1.12
Prothrombin Time: 14.3 seconds (ref 11.4–15.2)

## 2018-05-11 LAB — CBC
HCT: 30.6 % — ABNORMAL LOW (ref 36.0–46.0)
Hemoglobin: 9.2 g/dL — ABNORMAL LOW (ref 12.0–15.0)
MCH: 22.8 pg — ABNORMAL LOW (ref 26.0–34.0)
MCHC: 30.1 g/dL (ref 30.0–36.0)
MCV: 75.7 fL — ABNORMAL LOW (ref 78.0–100.0)
Platelets: 317 10*3/uL (ref 150–400)
RBC: 4.04 MIL/uL (ref 3.87–5.11)
RDW: 14.5 % (ref 11.5–15.5)
WBC: 6.1 10*3/uL (ref 4.0–10.5)

## 2018-05-11 LAB — GLUCOSE, CAPILLARY: Glucose-Capillary: 195 mg/dL — ABNORMAL HIGH (ref 70–99)

## 2018-05-11 MED ORDER — SODIUM CHLORIDE 0.9 % IV SOLN: Freq: Once | INTRAVENOUS | Status: DC

## 2018-05-11 MED ORDER — FENTANYL CITRATE (PF) 100 MCG/2ML IJ SOLN
INTRAMUSCULAR | Status: AC
  Filled 2018-05-11: qty 2

## 2018-05-11 MED ORDER — VERAPAMIL HCL 2.5 MG/ML IV SOLN
INTRAVENOUS | Status: AC
  Filled 2018-05-11: qty 2

## 2018-05-11 MED ORDER — HEPARIN SODIUM (PORCINE) 1000 UNIT/ML IJ SOLN
INTRAMUSCULAR | Status: AC
  Filled 2018-05-11: qty 1

## 2018-05-11 MED ORDER — LIDOCAINE HCL (PF) 1 % IJ SOLN
INTRAMUSCULAR | Status: AC | PRN
  Administered 2018-05-11: 10 mL

## 2018-05-11 MED ORDER — HEPARIN SODIUM (PORCINE) 1000 UNIT/ML IJ SOLN
INTRAMUSCULAR | Status: AC | PRN
  Administered 2018-05-11: 1000 [IU] via INTRAVENOUS

## 2018-05-11 MED ORDER — MIDAZOLAM HCL 2 MG/2ML IJ SOLN
INTRAMUSCULAR | Status: AC | PRN
  Administered 2018-05-11: 1 mg via INTRAVENOUS

## 2018-05-11 MED ORDER — SODIUM CHLORIDE 0.9 % IV SOLN: INTRAVENOUS | Status: AC

## 2018-05-11 MED ORDER — MIDAZOLAM HCL 2 MG/2ML IJ SOLN
INTRAMUSCULAR | Status: AC
  Filled 2018-05-11: qty 2

## 2018-05-11 MED ORDER — FENTANYL CITRATE (PF) 100 MCG/2ML IJ SOLN
INTRAMUSCULAR | Status: AC | PRN
  Administered 2018-05-11 (×2): 25 ug via INTRAVENOUS

## 2018-05-11 MED ORDER — NITROGLYCERIN 1 MG/10 ML FOR IR/CATH LAB
INTRA_ARTERIAL | Status: AC
  Filled 2018-05-11: qty 10

## 2018-05-11 MED ORDER — LIDOCAINE HCL 1 % IJ SOLN
INTRAMUSCULAR | Status: AC
  Filled 2018-05-11: qty 20

## 2018-05-11 NOTE — Procedures (Signed)
S/P 4 vessel cwerebral arteriogram RT CFA approach. Findings. 1.Severe Lt VA origin stenosis. 2.Obliterated ACOM aneurysm with no coil compaction

## 2018-05-11 NOTE — Progress Notes (Addendum)
On arrival from radiology right radial site with hematoma noted and pressure held x 10 min and right radial site softer client states," stop holding my wrist" and explained to client I needed to hold pressure to stop bleeding under the skin; client states, "get out of my room"; report given to Uvaldo Bristle; called PA to see client; called Dr Estanislado Pandy and notified of hematoma and Dr Estanislado Pandy in to see client; client advised by Dr Estanislado Pandy to not use right wrist x 24hours and client continues to move wrist

## 2018-05-11 NOTE — Discharge Instructions (Addendum)
Cerebral Angiogram, Care After °Refer to this sheet in the next few weeks. These instructions provide you with information on caring for yourself after your procedure. Your health care provider may also give you more specific instructions. Your treatment has been planned according to current medical practices, but problems sometimes occur. Call your health care provider if you have any problems or questions after your procedure. °What can I expect after the procedure? °After your procedure, it is typical to have the following: °· Bruising at the catheter insertion site that usually fades within 1-2 weeks. °· Blood collecting in the tissue (hematoma) that may be painful to the touch. It should usually decrease in size and tenderness within 1-2 weeks. °· A mild headache. ° °Follow these instructions at home: °· Take medicines only as directed by your health care provider. °· You may shower 24-48 hours after the procedure or as directed by your health care provider. Remove the bandage (dressing) and gently wash the site with plain soap and water. Pat the area dry with a clean towel. Do not rub the site, because this may cause bleeding. °· Do not take baths, swim, or use a hot tub until your health care provider approves. °· Check your insertion site every day for redness, swelling, or drainage. °· Do not apply powder or lotion to the site. °· Do not lift over 10 lb (4.5 kg) for 5 days after your procedure or as directed by your health care provider. °· Ask your health care provider when it is okay to: °? Return to work or school. °? Resume usual physical activities or sports. °? Resume sexual activity. °· Do not drive home if you are discharged the same day as the procedure. Have someone else drive you. °· You may drive 24 hours after the procedure unless otherwise instructed by your health care provider. °· Do not operate machinery or power tools for 24 hours after the procedure or as directed by your health care  provider. °· If your procedure was done as an outpatient procedure, which means that you went home the same day as your procedure, a responsible adult should be with you for the first 24 hours after you arrive home. °· Keep all follow-up visits as directed by your health care provider. This is important. °Contact a health care provider if: °· You have a fever. °· You have chills. °· You have increased bleeding from the catheter insertion site. Hold pressure on the site. °Get help right away if: °· You have vision changes or loss of vision. °· You have numbness or weakness on one side of your body. °· You have difficulty talking, or you have slurred speech or cannot speak (aphasia). °· You feel confused or have difficulty remembering. °· You have unusual pain at the catheter insertion site. °· You have redness, warmth, or swelling at the catheter insertion site. °· You have drainage (other than a small amount of blood on the dressing) from the catheter insertion site. °· The catheter insertion site is bleeding, and the bleeding does not stop after 30 minutes of holding steady pressure on the site. °These symptoms may represent a serious problem that is an emergency. Do not wait to see if the symptoms will go away. Get medical help right away. Call your local emergency services (911 in [place] Do not drive yourself to the hospital. °This information is not intended to replace advice given to you by your health care provider. Make sure you discuss any questions   you have with your health care provider. °Document Released: 01/09/2014 Document Revised: 01/31/2016 Document Reviewed: 09/07/2013 °Elsevier Interactive Patient Education © 2017 [employer] °Moderate Conscious Sedation, Adult, Care After °These instructions provide you with information about caring for yourself after your procedure. Your health care provider may also give you more specific instructions. Your treatment has been planned according to current  medical practices, but problems sometimes occur. Call your health care provider if you have any problems or questions after your procedure. °What can I expect after the procedure? °After your procedure, it is common: °· To feel sleepy for several hours. °· To feel clumsy and have poor balance for several hours. °· To have poor judgment for several hours. °· To vomit if you eat too soon. ° °Follow these instructions at home: °For at least 24 hours after the procedure: ° °· Do not: °? Participate in activities where you could fall or become injured. °? Drive. °? Use heavy machinery. °? Drink alcohol. °? Take sleeping pills or medicines that cause drowsiness. °? Make important decisions or sign legal documents. °? Take care of children on your own. °· Rest. °Eating and drinking °· Follow the diet recommended by your health care provider. °· If you vomit: °? Drink water, juice, or soup when you can drink without vomiting. °? Make sure you have little or no nausea before eating solid foods. °General instructions °· Have a responsible adult stay with you until you are awake and alert. °· Take over-the-counter and prescription medicines only as told by your health care provider. °· If you smoke, do not smoke without supervision. °· Keep all follow-up visits as told by your health care provider. This is important. °Contact a health care provider if: °· You keep feeling nauseous or you keep vomiting. °· You feel light-headed. °· You develop a rash. °· You have a fever. °Get help right away if: °· You have trouble breathing. °This information is not intended to replace advice given to you by your health care provider. Make sure you discuss any questions you have with your health care provider. °Document Released: 06/15/2013 Document Revised: 01/28/2016 Document Reviewed: 12/15/2015 °Elsevier Interactive Patient Education © 2018 [employer] Inc. ° °

## 2018-05-11 NOTE — H&P (Signed)
Chief Complaint: Patient was seen in consultation today for cerebral arteriogram at the request of Dr Alphonse Guild  Supervising Physician: Luanne Bras  Patient Status: [facility] - Out-pt  History of Present Illness: Elizabeth [name] is a 77 y.o. 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 Previously displaced small coil in the right anterior cerebral artery A1 A2 junction stable with no obstruction of blood flow. 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 Reviewed imaging with patient and husband. Explained to patient that since she is not symptomatic from her left vertebral artery stenosis, management options include observation with routine imaging scans or diagnostic cerebral angiograms. Patient states she wishes to pursue with a diagnostic angiogram, but asks that we wait until August 2019 for procedure. Plan for follow-up with image-guided diagnostic cerebral angiogram in August 2019. Informed patient that our schedulers will call her to set up this appointment. Instructed patient to continue taking Plavix 75 mg once daily and Aspirin 325 mg once daily- prescription written for Plavix 75 mg.  Scheduled today for cerebral arteriogram  Past Medical History:  Diagnosis Date  . Active smoker   . Allergy   . Anxiety    Takes Xanax for  anxiety  . Arthritis   . Asthma   . Cataract   . Chronic airway obstruction, not elsewhere classified   . Clotting disorder ([place])   . Depression   . Diarrhea   . Diverticulosis of colon (without mention of hemorrhage)   . Esophageal reflux   . Family history of malignant neoplasm of gastrointestinal tract   . Headache(784.0)    Irregular  . Neuromuscular disorder ([place])   . Obesity, unspecified   . Other and unspecified hyperlipidemia   . Personal history of colonic polyps   . Stricture and stenosis of esophagus   . Stroke ([place])   . Thyroid disease   . Type II or unspecified type diabetes mellitus without mention of complication, not stated as uncontrolled   . Unspecified asthma(493.90)   . Unspecified essential hypertension     Past Surgical History:  Procedure Laterality Date  . APPENDECTOMY    . BACK SURGERY     Spinal surgery  . CARDIAC CATHETERIZATION  2006   LAD: 30%, RCA : 20%, normal EF, elevated LVEDP  . CARDIAC CATHETERIZATION    . CEREBRAL ANGIOGRAM  12/20/2015  . IR ANGIO INTRA EXTRACRAN SEL COM CAROTID INNOMINATE BILAT MOD SED  01/14/2018  . IR ANGIO VERTEBRAL SEL SUBCLAVIAN INNOMINATE UNI L MOD SED  01/14/2018  . IR ANGIO VERTEBRAL SEL VERTEBRAL UNI R MOD SED  01/14/2018  . KNEE ARTHROSCOPY     left  . LUMBAR DISC SURGERY    . TONSILLECTOMY    . TOTAL ABDOMINAL HYSTERECTOMY     menopause/bleeding    Allergies: Banana; Codeine; Stevia [stevioside]; Actos [pioglitazone]; Benicar [olmesartan medoxomil]; Chantix [varenicline]; Diovan [valsartan]; Latex; Lipitor [atorvastatin]; Spinach; Vicodin [hydrocodone-acetaminophen]; Zocor [simvastatin]; Ace inhibitors; Celebrex [celecoxib]; Penicillins; and Sulfonamide derivatives  Medications: Prior to Admission medications  Medication Sig Start Date End Date Taking? Authorizing Provider  albuterol (PROAIR HFA) 108 (90 Base) MCG/ACT inhaler INHALE 2 PUFFS EVERY SIX HOURS AS NEEDED FOR WHEEZING OR SHORTNESS OF BREATH  03/10/18  Yes Chipper Herb, MD  amLODipine (NORVASC) 10 MG tablet Take 1 tablet (10 mg total) by mouth daily. 03/10/18  Yes Chipper Herb, MD  aspirin 325 MG EC tablet Take 325 mg by mouth daily.   Yes [provider]  Carboxymethylcellulose Sodium (REFRESH LIQUIGEL) 1 % GEL Place 1 drop into both eyes 2 (two) times daily as needed (dry eyes).   Yes [provider]  CINNAMON PO Take 2,000 mg by mouth every other day.    Yes [provider]  clopidogrel (PLAVIX) 75 MG tablet TAKE ONE TABLET BY MOUTH DAILY (PLEASE USE AUROBINDO BRAND, PT HD FOR ALLERGY TO REDDY) 03/10/18  Yes Chipper Herb, MD  cyproheptadine (PERIACTIN) 4 MG tablet Take 2 mg by mouth daily as needed for allergies.   Yes [provider]  escitalopram (LEXAPRO) 10 MG tablet Take 1 tablet (10 mg total) by mouth daily. 03/10/18  Yes Chipper Herb, MD  glucose blood test strip Check BS BID and PRN. DX E11.9 01/08/15  Yes Chipper Herb, MD  hydrochlorothiazide (MICROZIDE) 12.5 MG capsule TAKE ONE CAPSULE BY MOUTH EVERY DAY Patient taking differently: Take 12.5 mg by mouth daily.  04/02/18  Yes Chipper Herb, MD  JANUVIA 50 MG tablet TAKE ONE TABLET BY MOUTH DAILY 05/18/17  Yes Chipper Herb, MD  metFORMIN (GLUCOPHAGE) 1000 MG tablet TAKE ONE TABLET BY MOUTH EVERY MORNING WITH BREAKFAST and TAKE 1/2 TABLET EVERY EVENING WITH FOOD Patient taking differently: Take 1,000 mg by mouth daily with breakfast. TAKE ONE TABLET BY MOUTH EVERY MORNING WITH BREAKFAST 02/08/18  Yes Chipper Herb, MD  metoCLOPramide (REGLAN) 10 MG tablet TAKE ONE TABLET BY MOUTH 1 TIME DAILY AS NEEDED Patient taking differently: Take 10 mg by mouth daily as needed for nausea. TAKE ONE TABLET BY MOUTH 1 TIME DAILY AS NEEDED 08/03/17  Yes Hassell Done, Deetra-Margaret, FNP  montelukast (SINGULAIR) 5 MG chewable tablet chew TWO tablets BY MOUTH AT BEDTIME Patient taking differently: Chew 2.5 mg by mouth at bedtime. chew half  tablet BY MOUTH AT  BEDTIME 03/10/18  Yes Chipper Herb, MD  pantoprazole (PROTONIX) 40 MG tablet TAKE 1 TABLET BY MOUTH EVERY DAY --FILL ONLY WHEN PATIENT REQUESTS 03/14/2016 KR-- 03/10/18  Yes Chipper Herb, MD  pravastatin (PRAVACHOL) 10 MG tablet Take 1 tablet (10 mg total) by mouth once a week. 03/10/18  Yes Chipper Herb, MD  Vitamin D, Ergocalciferol, (DRISDOL) 50000 units CAPS capsule TAKE 1 CAPSULE BY MOUTH EVERY SEVEN DAYS 02/24/18  Yes Chipper Herb, MD  ramipril (ALTACE) 10 MG capsule Take 10 mg by mouth daily.    11/14/11  [provider]     Family History  Problem Relation Age of Onset  . Diabetes Mother   . Heart disease Mother   . Asthma Mother   . Kidney disease Mother   . Drug abuse Mother   . Hypertension Mother   . Mental illness Mother        ?sociopath/psychopath  . Early death Father        trauma  . Colon cancer Other        first cousin, paternal aunt and uncle  . Cancer Paternal Grandmother        gastric  .  Breast cancer Maternal Aunt        several paternal cousins  . Heart disease Maternal Grandmother   . Thyroid cancer Other        aunt  . Thyroid disease Sister        uncertain type-not cancer  . Heart disease Sister   . Heart disease Brother   . Cancer Sister        pancreatic    Social History   Socioeconomic History  . Marital status: Married    Spouse name: [name]  . Number of children: 0  . Years of education: 16  . Highest education level: Bachelor's degree (e.g., BA, AB, BS)  Occupational History  . Occupation: Therapist, sports    Comment: [employer]  Social Needs  . Financial resource strain: Not hard at all  . Food insecurity:    Worry: Never true    Inability: Never true  . Transportation needs:    Medical: No    Non-medical: No  Tobacco Use  . Smoking status: Current Every Day Smoker    Packs/day: 1.00    Years: 20.00    Pack years: 20.00    Types: Cigarettes    Start date: 01/15/1983  . Smokeless tobacco: Never Used  Substance  and Sexual Activity  . Alcohol use: No    Alcohol/week: 0.0 standard drinks  . Drug use: No  . Sexual activity: Not Currently  Lifestyle  . Physical activity:    Days per week: 0 days    Minutes per session: 0 min  . Stress: To some extent  Relationships  . Social connections:    Talks on phone: Not on file    Gets together: Not on file    Attends religious service: Not on file    Active member of club or organization: Not on file    Attends meetings of clubs or organizations: Not on file    Relationship status: Not on file  Other Topics Concern  . Not on file  Social History Narrative   Married [name]   Limited activity due to back pain    Review of Systems: A 12 point ROS discussed and pertinent positives are indicated in the HPI above.  All other systems are negative.  Review of Systems  Constitutional: Negative for activity change, fatigue and fever.  HENT: Negative for tinnitus and trouble swallowing.   Eyes: Negative for visual disturbance.  Respiratory: Positive for cough and shortness of breath.   Cardiovascular: Negative for chest pain.  Gastrointestinal: Negative for abdominal pain.  Musculoskeletal: Negative for back pain and gait problem.  Neurological: Positive for headaches. Negative for dizziness, tremors, seizures, syncope, facial asymmetry, speech difficulty, weakness, light-headedness and numbness.  Psychiatric/Behavioral: Negative for behavioral problems and confusion.    Vital Signs: BP 127/67   Pulse 78   Temp 98.5 F (36.9 C) (Oral)   Resp 18   Ht 5\' 2"  (1.575 m)   Wt 190 lb 14.7 oz (86.6 kg)   SpO2 98%   BMI 34.92 kg/m   Physical Exam  Constitutional: She is oriented to person, place, and time. She appears well-nourished.  HENT:  Head: Atraumatic.  Eyes: EOM are normal.  Neck: Neck supple.  Cardiovascular: Normal rate and regular rhythm.  Pulmonary/Chest: Effort normal. She has wheezes.  Abdominal: Soft. Bowel sounds are normal.    Musculoskeletal: Normal range of motion.  Neurological: She is alert and oriented to person, place, and time.  Skin: Skin is warm  and dry.  Psychiatric: She has a normal mood and affect. Her behavior is normal. Judgment and thought content normal.  Vitals reviewed.   Imaging: No results found.  Labs:  CBC: Recent Labs    10/21/17 1002 01/14/18 0650 02/23/18 1020 05/11/18 0637  WBC 6.0 8.4 6.6 6.1  HGB 12.4 11.7* 11.6 9.2*  HCT 37.1 36.7 35.5 30.6*  PLT 338 309 332 317    COAGS: Recent Labs    01/14/18 0650 05/11/18 0637  INR 1.13 1.12    BMP: Recent Labs    06/17/17 0819 10/21/17 1002 01/14/18 0650 02/23/18 1020  NA 139 138 137 135  K 3.9 4.0 3.5 4.1  CL 96 100 102 97  CO2 23 22 25 25   GLUCOSE 175* 127* 140* 162*  BUN 7* 8 12 8   CALCIUM 10.3 10.4* 9.6 10.1  CREATININE 0.75 0.76 0.77 0.81  GFRNONAA 79 78 >60 71  GFRAA 91 89 >60 82    LIVER FUNCTION TESTS: Recent Labs    06/17/17 0819 10/21/17 1002 02/23/18 1020  BILITOT 0.5 0.5 0.5  AST 56* 50* 39  ALT 44* 45* 46*  ALKPHOS 91 106 105  PROT 7.1 7.2 7.1  ALBUMIN 4.3 4.6 4.2    TUMOR MARKERS: No results for input(s): AFPTM, CEA, CA199, CHROMGRNA in the last 8760 hours.  Assessment and Plan:  ACOM aneurysm embolization 2013 L VA stenosis seen on 01/2018 arteriogram-- since asymptomatic-- follow up was determined next step for pt Now scheduled for follow up cerebral arteiogram Risks and benefits of cerebral angiogram with intervention were discussed with the patient including, but not limited to bleeding, infection, vascular injury, contrast induced renal failure, stroke or even death.  This interventional procedure involves the use of X-rays and because of the nature of the planned procedure, it is possible that we will have prolonged use of X-ray fluoroscopy.  Potential radiation risks to you include (but are not limited to) the following: - A slightly elevated risk for cancer  several years  later in life. This risk is typically less than 0.5% percent. This risk is low in comparison to the normal incidence of human cancer, which is 33% for women and 50% for men according to the [place]. - Radiation induced injury can include skin redness, resembling a rash, tissue breakdown / ulcers and hair loss (which can be temporary or permanent).   The likelihood of either of these occurring depends on the difficulty of the procedure and whether you are sensitive to radiation due to previous procedures, disease, or genetic conditions.   IF your procedure requires a prolonged use of radiation, you will be notified and given written instructions for further action.  It is your responsibility to monitor the irradiated area for the 2 weeks following the procedure and to notify your physician if you are concerned that you have suffered a radiation induced injury.    All of the patient's questions were answered, patient is agreeable to proceed.  Consent signed and in chart.  Thank you for this interesting consult.  I greatly enjoyed meeting LANEY LOUDERBACK and look forward to participating in their care.  A copy of this report was sent to the requesting provider on this date.  Electronically Signed: Lavonia Drafts, PA-C 05/11/2018, 7:18 AM   I spent a total of    25 Minutes in face to face in clinical consultation, greater than 50% of which was counseling/coordinating care for cerebral arteriogram

## 2018-05-12 ENCOUNTER — Encounter (HOSPITAL_COMMUNITY): Payer: Self-pay | Admitting: Emergency Medicine

## 2018-05-12 ENCOUNTER — Telehealth (HOSPITAL_COMMUNITY): Payer: Self-pay

## 2018-05-12 ENCOUNTER — Other Ambulatory Visit: Payer: Self-pay

## 2018-05-12 ENCOUNTER — Emergency Department (HOSPITAL_COMMUNITY)
Admission: EM | Admit: 2018-05-12 | Discharge: 2018-05-12 | Disposition: A | Discharge status: Home / Self Care | Payer: PPO | Attending: Emergency Medicine | Admitting: Emergency Medicine

## 2018-05-12 DIAGNOSIS — Z7984 Long term (current) use of oral hypoglycemic drugs: Secondary | ICD-10-CM | POA: Insufficient documentation

## 2018-05-12 DIAGNOSIS — J45909 Unspecified asthma, uncomplicated: Secondary | ICD-10-CM | POA: Diagnosis not present

## 2018-05-12 DIAGNOSIS — Z5189 Encounter for other specified aftercare: Secondary | ICD-10-CM

## 2018-05-12 DIAGNOSIS — F329 Major depressive disorder, single episode, unspecified: Secondary | ICD-10-CM | POA: Diagnosis not present

## 2018-05-12 DIAGNOSIS — S61501D Unspecified open wound of right wrist, subsequent encounter: Secondary | ICD-10-CM | POA: Insufficient documentation

## 2018-05-12 DIAGNOSIS — F419 Anxiety disorder, unspecified: Secondary | ICD-10-CM | POA: Diagnosis not present

## 2018-05-12 DIAGNOSIS — Z7982 Long term (current) use of aspirin: Secondary | ICD-10-CM | POA: Diagnosis not present

## 2018-05-12 DIAGNOSIS — Z4800 Encounter for change or removal of nonsurgical wound dressing: Secondary | ICD-10-CM | POA: Diagnosis not present

## 2018-05-12 DIAGNOSIS — Z8673 Personal history of transient ischemic attack (TIA), and cerebral infarction without residual deficits: Secondary | ICD-10-CM | POA: Insufficient documentation

## 2018-05-12 DIAGNOSIS — E119 Type 2 diabetes mellitus without complications: Secondary | ICD-10-CM | POA: Diagnosis not present

## 2018-05-12 DIAGNOSIS — Z79899 Other long term (current) drug therapy: Secondary | ICD-10-CM | POA: Insufficient documentation

## 2018-05-12 DIAGNOSIS — Z7902 Long term (current) use of antithrombotics/antiplatelets: Secondary | ICD-10-CM | POA: Insufficient documentation

## 2018-05-12 DIAGNOSIS — X58XXXA Exposure to other specified factors, initial encounter: Secondary | ICD-10-CM | POA: Insufficient documentation

## 2018-05-12 DIAGNOSIS — Z91040 Latex allergy status: Secondary | ICD-10-CM | POA: Insufficient documentation

## 2018-05-12 DIAGNOSIS — F17210 Nicotine dependence, cigarettes, uncomplicated: Secondary | ICD-10-CM | POA: Insufficient documentation

## 2018-05-12 NOTE — ED Triage Notes (Signed)
Pt had an angiogram of her brain yesterday, has had many before. They attempted to go through her R wrist and they could not go through that access. Noted to have bruising to wrist. States her dressing is peeling up and was told not to change dressing until tomorrow. Wants to have her dressing changed.

## 2018-05-12 NOTE — ED Provider Notes (Signed)
[facility] EMERGENCY DEPARTMENT Provider Note   CSN: [id number] Arrival date & time: 05/12/18  1733     History   Chief Complaint Chief Complaint  Patient presents with  . Dressing Change    HPI Elizabeth [name] is a 77 y.o. female.  The history is provided by the patient. No language interpreter was used.  Wound Check  This is a new problem. The current episode started yesterday. The problem occurs constantly. Nothing aggravates the symptoms. Nothing relieves the symptoms. She has tried nothing for the symptoms. The treatment provided no relief.  Pt is here for a dressing change.  Pt had a procedure yesterday and dressing is pulling off  Past Medical History:  Diagnosis Date  . Active smoker   . Allergy   . Anxiety    Takes Xanax for anxiety  . Arthritis   . Asthma   . Cataract   . Chronic airway obstruction, not elsewhere classified   . Clotting disorder ([place])   . Depression   . Diarrhea   . Diverticulosis of colon (without mention of hemorrhage)   . Esophageal reflux   . Family history of malignant neoplasm of gastrointestinal tract   . Headache(784.0)    Irregular  . Neuromuscular disorder ([place])   . Obesity, unspecified   . Other and unspecified hyperlipidemia   . Personal history of colonic polyps   . Stricture and stenosis of esophagus   . Stroke ([place])   . Thyroid disease   . Type II or unspecified type diabetes mellitus without mention of complication, not stated as uncontrolled   . Unspecified asthma(493.90)   . Unspecified essential hypertension     Patient Active Problem List   Diagnosis Date Noted  . Stroke ([place])   . Elevated liver function tests 10/22/2017  . Anxiety and depression 10/21/2017  . Abdominal aortic atherosclerosis ([place]) 10/21/2017  . Type 2 diabetes mellitus with hyperlipidemia ([place]) 09/06/2015  . Type 2 diabetes mellitus with peripheral neuropathy ([place]) 11/27/2014  . CVA (cerebral vascular accident) ([place]) 02/27/2012  . Cerebral  aneurysm 02/27/2012  . 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 Gait instability 02/27/2012  . Vision disturbance following cerebrovascular accident 02/27/2012  . Dyspnea 11/14/2011  . Thyroid disease 10/12/2008  . Diabetes ([place]) 08/29/2008  . Hyperlipidemia 08/29/2008  . OBESITY, UNSPECIFIED 08/29/2008  . COPD (chronic obstructive pulmonary disease) ([place]) 08/29/2008  . WEIGHT LOSS 08/29/2008  . DIARRHEA 08/29/2008  . HTN (hypertension) 08/28/2008  . ASTHMA 08/28/2008  . ESOPHAGEAL STRICTURE 08/28/2008  . GERD 08/28/2008  . HIATAL HERNIA 08/28/2008  . DIVERTICULOSIS, COLON 08/28/2008  . COLONIC POLYPS, HX OF 08/28/2008    Past Surgical History:  Procedure Laterality Date  . APPENDECTOMY    . BACK SURGERY     Spinal surgery  . CARDIAC CATHETERIZATION  2006   LAD: 30%, RCA : 20%, normal EF, elevated LVEDP  . CARDIAC CATHETERIZATION    . CEREBRAL ANGIOGRAM  12/20/2015  . IR ANGIO INTRA EXTRACRAN SEL COM CAROTID INNOMINATE BILAT MOD SED  01/14/2018  . IR ANGIO VERTEBRAL SEL SUBCLAVIAN INNOMINATE UNI L MOD SED  01/14/2018  . IR ANGIO VERTEBRAL SEL VERTEBRAL UNI R MOD SED  01/14/2018  . KNEE ARTHROSCOPY     left  . LUMBAR DISC SURGERY    . TONSILLECTOMY    . TOTAL ABDOMINAL HYSTERECTOMY     menopause/bleeding     OB History   None      Home Medications  Prior to Admission medications   Medication Sig Start Date End Date Taking? Authorizing Provider  albuterol (PROAIR HFA) 108 (90 Base) MCG/ACT inhaler INHALE 2 PUFFS EVERY SIX HOURS AS NEEDED FOR WHEEZING OR SHORTNESS OF BREATH 03/10/18   Chipper Herb, MD  amLODipine (NORVASC) 10 MG tablet Take 1 tablet (10 mg total) by mouth daily. 03/10/18   Chipper Herb, MD  aspirin 325 MG EC tablet Take 325 mg by mouth daily.    [provider]  Carboxymethylcellulose Sodium (REFRESH LIQUIGEL) 1 % GEL Place 1 drop into both eyes 2 (two) times daily as needed (dry eyes).    [provider]  CINNAMON PO Take 2,000 mg  by mouth every other day.     [provider]  clopidogrel (PLAVIX) 75 MG tablet TAKE ONE TABLET BY MOUTH DAILY (PLEASE USE AUROBINDO BRAND, PT HD FOR ALLERGY TO REDDY) 03/10/18   Chipper Herb, MD  cyproheptadine (PERIACTIN) 4 MG tablet Take 2 mg by mouth daily as needed for allergies.    [provider]  escitalopram (LEXAPRO) 10 MG tablet Take 1 tablet (10 mg total) by mouth daily. 03/10/18   Chipper Herb, MD  glucose blood test strip Check BS BID and PRN. DX E11.9 01/08/15   Chipper Herb, MD  hydrochlorothiazide (MICROZIDE) 12.5 MG capsule TAKE ONE CAPSULE BY MOUTH EVERY DAY Patient taking differently: Take 12.5 mg by mouth daily.  04/02/18   Chipper Herb, MD  JANUVIA 50 MG tablet TAKE ONE TABLET BY MOUTH DAILY 05/18/17   Chipper Herb, MD  metFORMIN (GLUCOPHAGE) 1000 MG tablet TAKE ONE TABLET BY MOUTH EVERY MORNING WITH BREAKFAST and TAKE 1/2 TABLET EVERY EVENING WITH FOOD Patient taking differently: Take 1,000 mg by mouth daily with breakfast. TAKE ONE TABLET BY MOUTH EVERY MORNING WITH BREAKFAST 02/08/18   Chipper Herb, MD  metoCLOPramide (REGLAN) 10 MG tablet TAKE ONE TABLET BY MOUTH 1 TIME DAILY AS NEEDED Patient taking differently: Take 10 mg by mouth daily as needed for nausea. TAKE ONE TABLET BY MOUTH 1 TIME DAILY AS NEEDED 08/03/17   Hassell Done, Floriene-Margaret, FNP  montelukast (SINGULAIR) 5 MG chewable tablet chew TWO tablets BY MOUTH AT BEDTIME Patient taking differently: Chew 2.5 mg by mouth at bedtime. chew half  tablet BY MOUTH AT BEDTIME 03/10/18   Chipper Herb, MD  pantoprazole (PROTONIX) 40 MG tablet TAKE 1 TABLET BY MOUTH EVERY DAY --FILL ONLY WHEN PATIENT REQUESTS 03/14/2016 KR-- 03/10/18   Chipper Herb, MD  pravastatin (PRAVACHOL) 10 MG tablet Take 1 tablet (10 mg total) by mouth once a week. 03/10/18   Chipper Herb, MD  Vitamin D, Ergocalciferol, (DRISDOL) 50000 units CAPS capsule TAKE 1 CAPSULE BY MOUTH EVERY SEVEN DAYS 02/24/18   Chipper Herb, MD    ramipril (ALTACE) 10 MG capsule Take 10 mg by mouth daily.    11/14/11  [provider]    Family History Family History  Problem Relation Age of Onset  . Diabetes Mother   . Heart disease Mother   . Asthma Mother   . Kidney disease Mother   . Drug abuse Mother   . Hypertension Mother   . Mental illness Mother        ?sociopath/psychopath  . Early death Father        trauma  . Colon cancer Other        first cousin, paternal aunt and uncle  . Cancer Paternal Grandmother  gastric  . Breast cancer Maternal Aunt        several paternal cousins  . Heart disease Maternal Grandmother   . Thyroid cancer Other        aunt  . Thyroid disease Sister        uncertain type-not cancer  . Heart disease Sister   . Heart disease Brother   . Cancer Sister        pancreatic    Social History Social History   Tobacco Use  . Smoking status: Current Every Day Smoker    Packs/day: 1.00    Years: 20.00    Pack years: 20.00    Types: Cigarettes    Start date: 01/15/1983  . Smokeless tobacco: Never Used  Substance Use Topics  . Alcohol use: No    Alcohol/week: 0.0 standard drinks  . Drug use: No     Allergies   Banana; Codeine; Stevia [stevioside]; Actos [pioglitazone]; Benicar [olmesartan medoxomil]; Chantix [varenicline]; Diovan [valsartan]; Latex; Lipitor [atorvastatin]; Spinach; Vicodin [hydrocodone-acetaminophen]; Zocor [simvastatin]; Ace inhibitors; Celebrex [celecoxib]; Penicillins; and Sulfonamide derivatives   Review of Systems Review of Systems  All other systems reviewed and are negative.    Physical Exam Updated Vital Signs BP (!) 171/66 (BP Location: Right Arm)   Pulse 87   Temp 98.4 F (36.9 C) (Temporal)   Resp 14   Ht 5\' 2"  (1.575 m)   Wt 86.6 kg   SpO2 97%   BMI 34.92 kg/m   Physical Exam  Constitutional: She appears well-developed and well-nourished.  Musculoskeletal: Normal range of motion.  Bruised area wrist at radial artery,  No  sign of infection  Skin: Skin is warm.  Psychiatric: She has a normal mood and affect.     ED Treatments / Results  Labs (all labs ordered are listed, but only abnormal results are displayed) Labs Reviewed - No data to display  EKG None  Radiology No results found.  Procedures Procedures (including critical care time)  Medications Ordered in ED Medications - No data to display   Initial Impression / Assessment and Plan / ED Course  I have reviewed the triage vital signs and the nursing notes.  Pertinent labs & imaging results that were available during my care of the patient were reviewed by me and considered in my medical decision making (see chart for details).     Dressing changed by RN.  Pt advised to follow up as scheduled  Final Clinical Impressions(s) / ED Diagnoses   Final diagnoses:  Visit for wound care    ED Discharge Orders    None    An After Visit Summary was printed and given to the patient.   Fransico Meadow, [place] 05/12/18 Darnelle Spangle, MD 05/13/18 [phone]

## 2018-05-12 NOTE — Telephone Encounter (Signed)
Pt called saying that her radial site where they tried to stick yesterday was still oozing. I called and spoke to Dr. Estanislado Pandy who advised the pt to have her husband hold pressure for 20 minutes and if it doesn't get better then she may have to go to the ED. 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 AW

## 2018-05-12 NOTE — Discharge Instructions (Addendum)
Return if any problems.  Follow up as scheduled

## 2018-05-13 ENCOUNTER — Encounter (HOSPITAL_COMMUNITY): Payer: Self-pay | Admitting: Interventional Radiology

## 2018-05-20 ENCOUNTER — Ambulatory Visit: Payer: PPO | Admitting: Family Medicine

## 2018-05-21 ENCOUNTER — Ambulatory Visit (INDEPENDENT_AMBULATORY_CARE_PROVIDER_SITE_OTHER): Payer: PPO | Admitting: Family

## 2018-05-21 ENCOUNTER — Encounter: Payer: Self-pay | Admitting: Family

## 2018-05-21 VITALS — BP 126/59 | HR 84 | Temp 97.6°F | Ht 62.0 in | Wt 194.0 lb

## 2018-05-21 DIAGNOSIS — M25521 Pain in right elbow: Secondary | ICD-10-CM

## 2018-05-21 DIAGNOSIS — J441 Chronic obstructive pulmonary disease with (acute) exacerbation: Secondary | ICD-10-CM

## 2018-05-21 MED ORDER — AZITHROMYCIN 250 MG PO TABS: ORAL_TABLET | ORAL | 0 refills | Status: DC

## 2018-05-21 MED ORDER — FLUTICASONE FUROATE-VILANTEROL 200-25 MCG/INH IN AEPB: 1.0000 | INHALATION_SPRAY | Freq: Every day | RESPIRATORY_TRACT | 2 refills | Status: DC

## 2018-05-21 MED ORDER — PREDNISONE 10 MG (21) PO TBPK: ORAL_TABLET | ORAL | 0 refills | Status: DC

## 2018-05-21 NOTE — Progress Notes (Signed)
   Subjective:    Patient ID: Elizabeth [name], female    DOB: December 13, 1940, 77 y.o.   MRN: [id number]  Chief Complaint  Patient presents with  . pain right elbow   Pt presents to the office today with right elbow swelling and tenderness that started this morning. She reports the pain is intermittent stabbing pain of 2 out 10.  Denies any injury or new activities.  Wheezing   This is a recurrent problem. The current episode started in the past 7 days. The problem occurs intermittently. The problem has been gradually worsening. Associated symptoms include coughing, rhinorrhea, shortness of breath, a sore throat and sputum production. Pertinent negatives include no fever or headaches. She has tried rest for the symptoms. The treatment provided mild relief. Her past medical history is significant for COPD.      Review of Systems  Constitutional: Negative for fever.  HENT: Positive for rhinorrhea and sore throat.   Respiratory: Positive for cough, sputum production, shortness of breath and wheezing.   Neurological: Negative for headaches.  All other systems reviewed and are negative.      Objective:   Physical Exam  Constitutional: She is oriented to person, place, and time. She appears well-developed and well-nourished. No distress.  HENT:  Head: Normocephalic and atraumatic.  Right Ear: External ear normal.  Left Ear: External ear normal.  Nose: Mucosal edema and rhinorrhea present.  Mouth/Throat: Posterior oropharyngeal erythema present.  Eyes: Pupils are equal, round, and reactive to light.  Neck: Normal range of motion. Neck supple. No thyromegaly present.  Cardiovascular: Normal rate, regular rhythm, normal heart sounds and intact distal pulses.  No murmur heard. Pulmonary/Chest: Effort normal. No respiratory distress. She has wheezes. She has rhonchi.  Abdominal: Soft. Bowel sounds are normal. She exhibits no distension. There is no tenderness.  Musculoskeletal: Normal range of  motion. She exhibits no edema or tenderness.  Full ROM of right elbow, no tenderness present   Neurological: She is alert and oriented to person, place, and time. She has normal reflexes. No cranial nerve deficit.  Skin: Skin is warm and dry.  Psychiatric: She has a normal mood and affect. Her behavior is normal. Judgment and thought content normal.  Vitals reviewed.    BP (!) 126/59   Pulse 84   Temp 97.6 F (36.4 C) (Oral)   Ht 5\' 2"  (1.575 m)   Wt 194 lb (88 kg)   BMI 35.48 kg/m      Assessment & Plan:  Elizabeth [name] comes in today with chief complaint of pain right elbow   Diagnosis and orders addressed:  1. Right elbow pain Avoid injury Ice  - predniSONE (STERAPRED UNI-PAK 21 TAB) 10 MG (21) TBPK tablet; Use as directed  Dispense: 21 tablet; Refill: 0  2. Chronic obstructive pulmonary disease with acute exacerbation ([place]) Will start Breo today Strict low carb diet and good control of blood glucose with steroids RTO if symptoms worsen or do not improve  - azithromycin (ZITHROMAX Z-PAK) 250 MG tablet; As directed  Dispense: 1 each; Refill: 0 - predniSONE (STERAPRED UNI-PAK 21 TAB) 10 MG (21) TBPK tablet; Use as directed  Dispense: 21 tablet; Refill: 0 - fluticasone furoate-vilanterol (BREO ELLIPTA) 200-25 MCG/INH AEPB; Inhale 1 puff into the lungs daily.  Dispense: 1 each; Refill: [place], FNP

## 2018-05-21 NOTE — Patient Instructions (Signed)

## 2018-06-15 ENCOUNTER — Other Ambulatory Visit: Payer: Self-pay | Admitting: Family Medicine

## 2018-06-16 NOTE — Telephone Encounter (Signed)
OV 06/29/18

## 2018-06-29 ENCOUNTER — Encounter: Payer: Self-pay | Admitting: Family Medicine

## 2018-06-29 ENCOUNTER — Ambulatory Visit (INDEPENDENT_AMBULATORY_CARE_PROVIDER_SITE_OTHER): Payer: PPO

## 2018-06-29 ENCOUNTER — Ambulatory Visit (INDEPENDENT_AMBULATORY_CARE_PROVIDER_SITE_OTHER): Payer: PPO | Admitting: Family Medicine

## 2018-06-29 VITALS — BP 140/68 | HR 86 | Temp 98.3°F | Ht 62.0 in | Wt 190.0 lb

## 2018-06-29 DIAGNOSIS — I2510 Atherosclerotic heart disease of native coronary artery without angina pectoris: Secondary | ICD-10-CM

## 2018-06-29 DIAGNOSIS — M25512 Pain in left shoulder: Secondary | ICD-10-CM

## 2018-06-29 DIAGNOSIS — M542 Cervicalgia: Secondary | ICD-10-CM

## 2018-06-29 DIAGNOSIS — E559 Vitamin D deficiency, unspecified: Secondary | ICD-10-CM | POA: Diagnosis not present

## 2018-06-29 DIAGNOSIS — E079 Disorder of thyroid, unspecified: Secondary | ICD-10-CM

## 2018-06-29 DIAGNOSIS — E1142 Type 2 diabetes mellitus with diabetic polyneuropathy: Secondary | ICD-10-CM

## 2018-06-29 DIAGNOSIS — I10 Essential (primary) hypertension: Secondary | ICD-10-CM | POA: Diagnosis not present

## 2018-06-29 DIAGNOSIS — G450 Vertebro-basilar artery syndrome: Secondary | ICD-10-CM | POA: Diagnosis not present

## 2018-06-29 DIAGNOSIS — E7849 Other hyperlipidemia: Secondary | ICD-10-CM

## 2018-06-29 DIAGNOSIS — K219 Gastro-esophageal reflux disease without esophagitis: Secondary | ICD-10-CM | POA: Diagnosis not present

## 2018-06-29 DIAGNOSIS — Z1283 Encounter for screening for malignant neoplasm of skin: Secondary | ICD-10-CM | POA: Diagnosis not present

## 2018-06-29 DIAGNOSIS — M19012 Primary osteoarthritis, left shoulder: Secondary | ICD-10-CM | POA: Diagnosis not present

## 2018-06-29 LAB — BAYER DCA HB A1C WAIVED: HB A1C (BAYER DCA - WAIVED): 8.1 % — ABNORMAL HIGH (ref <7.0)

## 2018-06-29 NOTE — Progress Notes (Signed)
Subjective:    Patient ID: Elizabeth [name], female    DOB: 11/02/1940, 77 y.o.   MRN: [id number]  HPI Pt here for follow up and management of chronic medical problems which includes diabetes, hypertension and hyperlipidemia. She is taking medication regularly.  The patient is here for her regular visit and she does complain with some neck pain today.  She will be given an FOBT to return and will get lab work today.  Her blood pressure was slightly elevated at 145/63.  Elizabeth [name] has history of multiple medical problems and is not a big complainer.  She has thyroid issues she has had a past stroke.  She has GERD hyperlipidemia and hypertension in addition to arthritis and anxiety and COPD.  She also has a lot of allergies to medications foods and latex.  Patient is followed regularly by Dr. Patrecia Pour.  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  There is tortuosity there.  This appeared worse than the previous angiogram on 01/14/2018.  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 All liver function test are within normal limits the patient today also complains of left shoulder pain and left neck pain.  She said the neck pain is especially bad at night and she has numbness and is worse with moving.  This is been going on for several days.  She says that her blood pressures at home run in the 130s over the 80s.  She denies any chest pain or shortness of breath.  She does have occasional heartburn and takes Rolaids for this and if he gets really bad takes a Protonix.  She is passing her water without problems.  A repeat blood pressure with a large cuff with the patient sitting in the right arm was 140/68.  All liver function tests were within normal limits.    Patient Active Problem List   Diagnosis Date Noted  . Stroke ([place])   . Elevated liver function tests 10/22/2017  . Anxiety and depression 10/21/2017  . Abdominal aortic  atherosclerosis ([place]) 10/21/2017  . Type 2 diabetes mellitus with hyperlipidemia ([place]) 09/06/2015  . Type 2 diabetes mellitus with peripheral neuropathy ([place]) 11/27/2014  . CVA (cerebral vascular accident) ([place]) 02/27/2012  . Cerebral aneurysm 02/27/2012  . 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 Gait instability 02/27/2012  . Vision disturbance following cerebrovascular accident 02/27/2012  . Dyspnea 11/14/2011  . Thyroid disease 10/12/2008  . Diabetes ([place]) 08/29/2008  . Hyperlipidemia 08/29/2008  . OBESITY, UNSPECIFIED 08/29/2008  . COPD (chronic obstructive pulmonary disease) ([place]) 08/29/2008  . WEIGHT LOSS 08/29/2008  . DIARRHEA 08/29/2008  . HTN (hypertension) 08/28/2008  . ASTHMA 08/28/2008  . ESOPHAGEAL STRICTURE 08/28/2008  . GERD 08/28/2008  . HIATAL HERNIA 08/28/2008  . DIVERTICULOSIS, COLON 08/28/2008  . COLONIC POLYPS, HX OF 08/28/2008   Outpatient Encounter Medications as of 06/29/2018  Medication Sig  . albuterol (PROAIR HFA) 108 (90 Base) MCG/ACT inhaler INHALE 2 PUFFS EVERY SIX HOURS AS NEEDED FOR WHEEZING OR SHORTNESS OF BREATH  . amLODipine (NORVASC) 10 MG tablet Take 1 tablet (10 mg total) by mouth daily.  Marland Kitchen aspirin 325 MG EC tablet Take 325 mg by mouth daily.  . Carboxymethylcellulose Sodium (REFRESH LIQUIGEL) 1 % GEL Place 1 drop into both eyes 2 (two) times daily as needed (dry eyes).  . CINNAMON PO Take 2,000 mg by mouth every other day.   . clopidogrel (PLAVIX) 75 MG tablet TAKE  ONE TABLET BY MOUTH DAILY (PLEASE USE AUROBINDO BRAND, PT HD FOR ALLERGY TO REDDY)  . cyproheptadine (PERIACTIN) 4 MG tablet Take 2 mg by mouth daily as needed for allergies.  Marland Kitchen escitalopram (LEXAPRO) 10 MG tablet TAKE 1 TABLET(10 MG) BY MOUTH DAILY  . glucose blood test strip Check BS BID and PRN. DX E11.9  . hydrochlorothiazide (MICROZIDE) 12.5 MG capsule TAKE ONE CAPSULE BY MOUTH EVERY DAY (Patient taking differently: Take 12.5 mg by mouth daily. )  . JANUVIA 50 MG tablet TAKE  ONE TABLET BY MOUTH DAILY  . metFORMIN (GLUCOPHAGE) 1000 MG tablet TAKE ONE TABLET BY MOUTH EVERY MORNING WITH BREAKFAST and TAKE 1/2 TABLET EVERY EVENING WITH FOOD (Patient taking differently: Take 1,000 mg by mouth daily with breakfast. TAKE ONE TABLET BY MOUTH EVERY MORNING WITH BREAKFAST)  . metoCLOPramide (REGLAN) 10 MG tablet TAKE ONE TABLET BY MOUTH 1 TIME DAILY AS NEEDED (Patient taking differently: Take 10 mg by mouth daily as needed for nausea. TAKE ONE TABLET BY MOUTH 1 TIME DAILY AS NEEDED)  . montelukast (SINGULAIR) 5 MG chewable tablet chew TWO tablets BY MOUTH AT BEDTIME (Patient taking differently: Chew 2.5 mg by mouth at bedtime. chew half  tablet BY MOUTH AT BEDTIME)  . pantoprazole (PROTONIX) 40 MG tablet TAKE 1 TABLET BY MOUTH EVERY DAY --FILL ONLY WHEN PATIENT REQUESTS 03/14/2016 KR--  . pravastatin (PRAVACHOL) 10 MG tablet Take 1 tablet (10 mg total) by mouth once a week.  . Vitamin D, Ergocalciferol, (DRISDOL) 50000 units CAPS capsule TAKE 1 CAPSULE BY MOUTH EVERY SEVEN DAYS  . [DISCONTINUED] azithromycin (ZITHROMAX Z-PAK) 250 MG tablet As directed  . [DISCONTINUED] fluticasone furoate-vilanterol (BREO ELLIPTA) 200-25 MCG/INH AEPB Inhale 1 puff into the lungs daily.  . [DISCONTINUED] predniSONE (STERAPRED UNI-PAK 21 TAB) 10 MG (21) TBPK tablet Use as directed  . [DISCONTINUED] ramipril (ALTACE) 10 MG capsule Take 10 mg by mouth daily.     No facility-administered encounter medications on [name] as of 06/29/2018.      Review of Systems  Constitutional: Negative.   HENT: Negative.   Eyes: Negative.   Respiratory: Negative.   Cardiovascular: Negative.   Gastrointestinal: Negative.   Endocrine: Negative.   Genitourinary: Negative.   Musculoskeletal: Positive for neck pain.  Skin: Negative.   Allergic/Immunologic: Negative.   Neurological: Negative.   Hematological: Negative.   Psychiatric/Behavioral: Negative.        Objective:   Physical Exam  Constitutional: She  is oriented to person, place, and time. She appears well-developed and well-nourished. No distress.  The patient is pleasant and informative about her health condition and her recent vascular studies indicating vertebrobasilar insufficiency with a need to follow-up on this again in 3 months after the previous one.  HENT:  Head: Normocephalic and atraumatic.  Right Ear: External ear normal.  Left Ear: External ear normal.  Nose: Nose normal.  Mouth/Throat: Oropharynx is clear and moist. No oropharyngeal exudate.  Eyes: Pupils are equal, round, and reactive to light. Conjunctivae and EOM are normal. Right eye exhibits no discharge. Left eye exhibits no discharge. No scleral icterus.  Neck: Normal range of motion. Neck supple. No thyromegaly present.  Cardiovascular: Normal rate, regular rhythm, normal heart sounds and intact distal pulses.  No murmur heard. The heart is regular at 84/min without murmurs and good pedal pulses were present bilaterally.  Pulmonary/Chest: Effort normal and breath sounds normal. She has no wheezes. She has no rales.  Clear anteriorly and posteriorly with no wheezes or rales  Abdominal: Soft. Bowel sounds are normal. She exhibits no mass. There is tenderness.  Slight right upper quadrant tenderness and patient indicates that since she is been taking the statin drug even though every 2 weeks that this seems to make this worse so she will stop the statin drug.  No masses organ enlargement or bruits.  Mild obesity.  Musculoskeletal: Normal range of motion. She exhibits tenderness. She exhibits no edema or deformity.  There was tenderness with palpation of the left lateral lower cervical spine area and the left anterior shoulder.  X-rays were ordered.  Lymphadenopathy:    She has no cervical adenopathy.  Neurological: She is alert and oriented to person, place, and time. She has normal reflexes. No cranial nerve deficit.  The patient had normal sensation in both feet and  good pulses were present bilaterally.  Skin: Skin is warm and dry. No rash noted.  Skin is dry.  There is one lesion on the nose which has been present for over a year and has a shiny appearance when the blood is blanched out of it.  We will schedule her to see Dr. Nevada Crane in reasonable for further evaluation of this lesion.  Psychiatric: She has a normal mood and affect. Her behavior is normal. Judgment and thought content normal.  The patient's mood affect and behavior were normal for her.  Nursing note and vitals reviewed.   BP (!) 145/63 (BP Location: Left Arm)   Pulse 86   Temp 98.3 F (36.8 C) (Oral)   Ht '5\' 2"'  (1.575 m)   Wt 190 lb (86.2 kg)   BMI 34.75 kg/m        Assessment & Plan:  1. Type 2 diabetes mellitus with peripheral neuropathy (HCC) -Continue with diabetes treatment as currently doing pending results of lab work. - CBC with Differential/Platelet - Bayer DCA Hb A1c Waived  2. Other hyperlipidemia -Patient has been intolerant to all statins even when the dose is spread out over 2 weeks.  She complains of some right upper quadrant abdominal pain that has been worse since she is been taking the statin.  I asked her to stop taking this and this was pravastatin. - CBC with Differential/Platelet - Lipid panel  3. Essential hypertension -The blood pressure was slightly elevated today and on repeat with a large cuff manually in the right arm it was 140/68. - BMP8+EGFR - CBC with Differential/Platelet - Hepatic function panel  4. Gastroesophageal reflux disease, esophagitis presence not specified -Minimal reflux control with Rolaids and Protonix. - CBC with Differential/Platelet  5. Vitamin D deficiency -Continue with vitamin D replacement pending results of lab work - CBC with Differential/Platelet - VITAMIN D 25 Hydroxy (Vit-D Deficiency, Fractures)  6. Thyroid disease - CBC with Differential/Platelet  7. ASCVD (arteriosclerotic cardiovascular  disease) -Continue with his aggressive therapeutic lifestyle changes as possible - CBC with Differential/Platelet - VITAMIN D 25 Hydroxy (Vit-D Deficiency, Fractures)  8. Left shoulder pain, unspecified chronicity -There was tenderness in the left lateral shoulder with palpation. - DG Cervical Spine Complete; Future - DG Shoulder Left; Future  9. Neck pain -There is also tenderness at the lower left cervical spine area. 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 Skin cancer screening -Referral to dermatology, Dr. Nevada Crane to look at lesion on anterior right nose.  Patient Instructions  Medicare Annual Wellness Visit  [facility] and the medical providers at [facility] strive to bring you the best medical care.  In doing so we not only want to address your current medical conditions and concerns but also to detect new conditions early and prevent illness, disease and health-related problems.    Medicare offers a yearly Wellness Visit which allows our clinical staff to assess your need for preventative services including immunizations, lifestyle education, counseling to decrease risk of preventable diseases and screening for fall risk and other medical concerns.    This visit is provided free of charge (no copay) for all Medicare recipients. The clinical pharmacists at [facility] have begun to conduct these Wellness Visits which will also include a thorough review of all your medications.    As you primary medical provider recommend that you make an appointment for your Annual Wellness Visit if you have not done so already this year.  You may set up this appointment before you leave today or you may call back ([phone]) and schedule an appointment.  Please make sure when you call that you mention that you are  scheduling your Annual Wellness Visit with the clinical pharmacist so that the appointment may be made for the proper length of time.     Continue current medications. Continue good therapeutic lifestyle changes which include good diet and exercise. Fall precautions discussed with patient. If an FOBT was given today- please return it to our front desk. If you are over 87 years old - you may need Prevnar 40 or the adult Pneumonia vaccine.  **Flu shots are available--- please call and schedule a FLU-CLINIC appointment**  After your visit with [place] today you will receive a survey in the mail or online from [employer] regarding your care with [place]. Please take a moment to fill this out. Your feedback is very important to [place] as you can help [place] better understand your patient needs as well as improve your experience and satisfaction. WE CARE ABOUT YOU!!!   Follow-up with interventional radiology as planned for repeat studies of subclavian stenosis We will call with x-ray results of cervical spine and left shoulder as soon as those results become available Continue to be careful and do not put yourself at risk for falling Continue to check blood sugars and blood pressures periodically   Arrie Senate MD

## 2018-06-29 NOTE — Patient Instructions (Addendum)
Medicare Annual Wellness Visit  [facility] and the medical providers at [facility] strive to bring you the best medical care.  In doing so we not only want to address your current medical conditions and concerns but also to detect new conditions early and prevent illness, disease and health-related problems.    Medicare offers a yearly Wellness Visit which allows our clinical staff to assess your need for preventative services including immunizations, lifestyle education, counseling to decrease risk of preventable diseases and screening for fall risk and other medical concerns.    This visit is provided free of charge (no copay) for all Medicare recipients. The clinical pharmacists at [facility] have begun to conduct these Wellness Visits which will also include a thorough review of all your medications.    As you primary medical provider recommend that you make an appointment for your Annual Wellness Visit if you have not done so already this year.  You may set up this appointment before you leave today or you may call back ([phone]) and schedule an appointment.  Please make sure when you call that you mention that you are scheduling your Annual Wellness Visit with the clinical pharmacist so that the appointment may be made for the proper length of time.     Continue current medications. Continue good therapeutic lifestyle changes which include good diet and exercise. Fall precautions discussed with patient. If an FOBT was given today- please return it to our front desk. If you are over 84 years old - you may need Prevnar 55 or the adult Pneumonia vaccine.  **Flu shots are available--- please call and schedule a FLU-CLINIC appointment**  After your visit with [place] today you will receive a survey in the mail or online from [employer] regarding your care with [place]. Please take a moment to fill this out. Your feedback is very  important to [place] as you can help [place] better understand your patient needs as well as improve your experience and satisfaction. WE CARE ABOUT YOU!!!   Follow-up with interventional radiology as planned for repeat studies of subclavian stenosis We will call with x-ray results of cervical spine and left shoulder as soon as those results become available Continue to be careful and do not put yourself at risk for falling Continue to check blood sugars and blood pressures periodically

## 2018-06-30 LAB — CBC WITH DIFFERENTIAL/PLATELET
Basophils Absolute: 0.1 10*3/uL (ref 0.0–0.2)
Basos: 1 %
EOS (ABSOLUTE): 0.1 10*3/uL (ref 0.0–0.4)
Eos: 2 %
Hematocrit: 32.2 % — ABNORMAL LOW (ref 34.0–46.6)
Hemoglobin: 9.8 g/dL — ABNORMAL LOW (ref 11.1–15.9)
Immature Grans (Abs): 0 10*3/uL (ref 0.0–0.1)
Immature Granulocytes: 1 %
Lymphocytes Absolute: 1.8 10*3/uL (ref 0.7–3.1)
Lymphs: 22 %
MCH: 21.7 pg — ABNORMAL LOW (ref 26.6–33.0)
MCHC: 30.4 g/dL — ABNORMAL LOW (ref 31.5–35.7)
MCV: 71 fL — ABNORMAL LOW (ref 79–97)
Monocytes Absolute: 0.5 10*3/uL (ref 0.1–0.9)
Monocytes: 6 %
Neutrophils Absolute: 5.8 10*3/uL (ref 1.4–7.0)
Neutrophils: 68 %
Platelets: 439 10*3/uL (ref 150–450)
RBC: 4.52 x10E6/uL (ref 3.77–5.28)
RDW: 15.2 % (ref 12.3–15.4)
WBC: 8.4 10*3/uL (ref 3.4–10.8)

## 2018-06-30 LAB — BMP8+EGFR
BUN/Creatinine Ratio: 12 (ref 12–28)
BUN: 9 mg/dL (ref 8–27)
CO2: 25 mmol/L (ref 20–29)
Calcium: 10.4 mg/dL — ABNORMAL HIGH (ref 8.7–10.3)
Chloride: 93 mmol/L — ABNORMAL LOW (ref 96–106)
Creatinine, Ser: 0.73 mg/dL (ref 0.57–1.00)
GFR calc Af Amer: 93 mL/min/{1.73_m2} (ref >59)
GFR calc non Af Amer: 81 mL/min/{1.73_m2} (ref >59)
Glucose: 179 mg/dL — ABNORMAL HIGH (ref 65–99)
Potassium: 4.1 mmol/L (ref 3.5–5.2)
Sodium: 135 mmol/L (ref 134–144)

## 2018-06-30 LAB — HEPATIC FUNCTION PANEL
ALT: 47 IU/L — ABNORMAL HIGH (ref 0–32)
AST: 58 IU/L — ABNORMAL HIGH (ref 0–40)
Albumin: 4.7 g/dL (ref 3.5–4.8)
Alkaline Phosphatase: 105 IU/L (ref 39–117)
Bilirubin Total: 0.5 mg/dL (ref 0.0–1.2)
Bilirubin, Direct: 0.2 mg/dL (ref 0.00–0.40)
Total Protein: 7.3 g/dL (ref 6.0–8.5)

## 2018-06-30 LAB — LIPID PANEL
Chol/HDL Ratio: 6.3 ratio — ABNORMAL HIGH (ref 0.0–4.4)
Cholesterol, Total: 253 mg/dL — ABNORMAL HIGH (ref 100–199)
HDL: 40 mg/dL (ref >39)
LDL Calculated: 190 mg/dL — ABNORMAL HIGH (ref 0–99)
Triglycerides: 114 mg/dL (ref 0–149)
VLDL Cholesterol Cal: 23 mg/dL (ref 5–40)

## 2018-06-30 LAB — VITAMIN D 25 HYDROXY (VIT D DEFICIENCY, FRACTURES): Vit D, 25-Hydroxy: 30.6 ng/mL (ref 30.0–100.0)

## 2018-07-03 LAB — VITAMIN B12: Vitamin B-12: 397 pg/mL (ref 232–1245)

## 2018-07-03 LAB — IRON AND TIBC
Iron Saturation: 4 % — CL (ref 15–55)
Iron: 20 ug/dL — ABNORMAL LOW (ref 27–139)
Total Iron Binding Capacity: 545 ug/dL — ABNORMAL HIGH (ref 250–450)
UIBC: 525 ug/dL — ABNORMAL HIGH (ref 118–369)

## 2018-07-03 LAB — SPECIMEN STATUS REPORT

## 2018-07-03 LAB — FERRITIN: Ferritin: 10 ng/mL — ABNORMAL LOW (ref 15–150)

## 2018-07-05 ENCOUNTER — Other Ambulatory Visit: Payer: Self-pay | Admitting: *Deleted

## 2018-07-05 DIAGNOSIS — D649 Anemia, unspecified: Secondary | ICD-10-CM

## 2018-07-08 ENCOUNTER — Telehealth: Payer: Self-pay | Admitting: Family Medicine

## 2018-07-08 DIAGNOSIS — E11319 Type 2 diabetes mellitus with unspecified diabetic retinopathy without macular edema: Secondary | ICD-10-CM | POA: Diagnosis not present

## 2018-07-08 NOTE — Telephone Encounter (Signed)
FYI for provider. No response necessary to pool.

## 2018-07-13 ENCOUNTER — Ambulatory Visit (INDEPENDENT_AMBULATORY_CARE_PROVIDER_SITE_OTHER): Payer: PPO | Admitting: Internal Medicine

## 2018-07-13 NOTE — Progress Notes (Deleted)
Past Medical History:  Diagnosis Date  . Active smoker   . Allergy   . Anxiety    Takes Xanax for anxiety  . Arthritis   . Asthma   . Cataract   . Chronic airway obstruction, not elsewhere classified   . Clotting disorder ([place])   . Depression   . Diarrhea   . Diverticulosis of colon (without mention of hemorrhage)   . Esophageal reflux   . Family history of malignant neoplasm of gastrointestinal tract   . Headache(784.0)    Irregular  . Neuromuscular disorder ([place])   . Obesity, unspecified   . Other and unspecified hyperlipidemia   . Personal history of colonic polyps   . Stricture and stenosis of esophagus   . Stroke ([place])   . Thyroid disease   . Type II or unspecified type diabetes mellitus without mention of complication, not stated as uncontrolled   . Unspecified asthma(493.90)   . Unspecified essential hypertension     Past Surgical History:  Procedure Laterality Date  . APPENDECTOMY    . BACK SURGERY     Spinal surgery  . CARDIAC CATHETERIZATION  2006   LAD: 30%, RCA : 20%, normal EF, elevated LVEDP  . CARDIAC CATHETERIZATION    . CEREBRAL ANGIOGRAM  12/20/2015  . IR ANGIO INTRA EXTRACRAN SEL COM CAROTID INNOMINATE BILAT MOD SED  01/14/2018  . IR ANGIO INTRA EXTRACRAN SEL COM CAROTID INNOMINATE BILAT MOD SED  05/11/2018  . IR ANGIO VERTEBRAL SEL SUBCLAVIAN INNOMINATE UNI L MOD SED  01/14/2018  . IR ANGIO VERTEBRAL SEL SUBCLAVIAN INNOMINATE UNI L MOD SED  05/11/2018  . IR ANGIO VERTEBRAL SEL VERTEBRAL UNI R MOD SED  01/14/2018  . IR ANGIO VERTEBRAL SEL VERTEBRAL UNI R MOD SED  05/11/2018  . KNEE ARTHROSCOPY     left  . LUMBAR DISC SURGERY    . TONSILLECTOMY    . TOTAL ABDOMINAL HYSTERECTOMY     menopause/bleeding    Allergies  Allergen Reactions  . Banana Anaphylaxis  . Codeine Nausea And Vomiting    PROJECTILE VOMITING  . Stevia [Stevioside] Swelling    Face, tongue, and eye swelling   . Actos [Pioglitazone] Swelling  . Benicar [Olmesartan Medoxomil] Other (See  Comments)    Feel bad  . Chantix [Varenicline] Other (See Comments)    "Worked opposite."  . Diovan [Valsartan] Other (See Comments)    Feel bad  . Latex     REACTION: red rash  . Lipitor [Atorvastatin] Other (See Comments)    REACTION:  Joint pain  . Spinach     Stomach problems  . Vicodin [Hydrocodone-Acetaminophen] Nausea And Vomiting  . Zocor [Simvastatin] Other (See Comments)    REACTION:  "made liver function incorrectly"  . Ace Inhibitors Cough  . Celebrex [Celecoxib] Rash  . Penicillins Rash  . Sulfonamide Derivatives Rash    Current Outpatient Medications on File Prior to Visit  Medication Sig Dispense Refill  . albuterol (PROAIR HFA) 108 (90 Base) MCG/ACT inhaler INHALE 2 PUFFS EVERY SIX HOURS AS NEEDED FOR WHEEZING OR SHORTNESS OF BREATH 8.5 g 2  . amLODipine (NORVASC) 10 MG tablet Take 1 tablet (10 mg total) by mouth daily. 90 tablet 1  . aspirin 325 MG EC tablet Take 325 mg by mouth daily.    . Carboxymethylcellulose Sodium (REFRESH LIQUIGEL) 1 % GEL Place 1 drop into both eyes 2 (two) times daily as needed (dry eyes).    . CINNAMON PO Take 2,000 mg by mouth  every other day.     . clopidogrel (PLAVIX) 75 MG tablet TAKE ONE TABLET BY MOUTH DAILY (PLEASE USE AUROBINDO BRAND, PT HD FOR ALLERGY TO REDDY) 90 tablet 1  . cyproheptadine (PERIACTIN) 4 MG tablet Take 2 mg by mouth daily as needed for allergies.    Marland Kitchen escitalopram (LEXAPRO) 10 MG tablet TAKE 1 TABLET(10 MG) BY MOUTH DAILY 90 tablet 0  . glucose blood test strip Check BS BID and PRN. DX E11.9 100 each 11  . hydrochlorothiazide (MICROZIDE) 12.5 MG capsule TAKE ONE CAPSULE BY MOUTH EVERY DAY (Patient taking differently: Take 12.5 mg by mouth daily. ) 90 capsule 1  . JANUVIA 50 MG tablet TAKE ONE TABLET BY MOUTH DAILY 30 tablet 2  . metFORMIN (GLUCOPHAGE) 1000 MG tablet TAKE ONE TABLET BY MOUTH EVERY MORNING WITH BREAKFAST and TAKE 1/2 TABLET EVERY EVENING WITH FOOD (Patient taking differently: Take 1,000 mg by mouth  daily with breakfast. TAKE ONE TABLET BY MOUTH EVERY MORNING WITH BREAKFAST) 135 tablet 0  . metoCLOPramide (REGLAN) 10 MG tablet TAKE ONE TABLET BY MOUTH 1 TIME DAILY AS NEEDED (Patient taking differently: Take 10 mg by mouth daily as needed for nausea. TAKE ONE TABLET BY MOUTH 1 TIME DAILY AS NEEDED) 30 tablet 5  . montelukast (SINGULAIR) 5 MG chewable tablet chew TWO tablets BY MOUTH AT BEDTIME (Patient taking differently: Chew 2.5 mg by mouth at bedtime. chew half  tablet BY MOUTH AT BEDTIME) 60 tablet 2  . pantoprazole (PROTONIX) 40 MG tablet TAKE 1 TABLET BY MOUTH EVERY DAY --FILL ONLY WHEN PATIENT REQUESTS 03/14/2016 KR-- 90 tablet 1  . pravastatin (PRAVACHOL) 10 MG tablet Take 1 tablet (10 mg total) by mouth once a week. 12 tablet 3  . Vitamin D, Ergocalciferol, (DRISDOL) 50000 units CAPS capsule TAKE 1 CAPSULE BY MOUTH EVERY SEVEN DAYS 12 capsule 3  . [DISCONTINUED] ramipril (ALTACE) 10 MG capsule Take 10 mg by mouth daily.       No current facility-administered medications on file prior to visit.

## 2018-07-14 ENCOUNTER — Other Ambulatory Visit: Payer: PPO

## 2018-07-14 DIAGNOSIS — Z1211 Encounter for screening for malignant neoplasm of colon: Secondary | ICD-10-CM

## 2018-07-15 LAB — FECAL OCCULT BLOOD, IMMUNOCHEMICAL: Fecal Occult Bld: POSITIVE — AB

## 2018-07-28 ENCOUNTER — Encounter (INDEPENDENT_AMBULATORY_CARE_PROVIDER_SITE_OTHER): Payer: Self-pay | Admitting: Internal Medicine

## 2018-07-28 ENCOUNTER — Ambulatory Visit (INDEPENDENT_AMBULATORY_CARE_PROVIDER_SITE_OTHER): Payer: PPO | Admitting: Internal Medicine

## 2018-07-28 ENCOUNTER — Encounter (HOSPITAL_COMMUNITY): Payer: Self-pay | Admitting: Internal Medicine

## 2018-07-28 ENCOUNTER — Encounter (INDEPENDENT_AMBULATORY_CARE_PROVIDER_SITE_OTHER): Payer: Self-pay | Admitting: *Deleted

## 2018-07-28 ENCOUNTER — Telehealth (INDEPENDENT_AMBULATORY_CARE_PROVIDER_SITE_OTHER): Payer: Self-pay | Admitting: *Deleted

## 2018-07-28 VITALS — BP 164/60 | HR 68 | Temp 98.5°F | Ht 62.0 in | Wt 188.5 lb

## 2018-07-28 DIAGNOSIS — R1310 Dysphagia, unspecified: Secondary | ICD-10-CM

## 2018-07-28 DIAGNOSIS — D508 Other iron deficiency anemias: Secondary | ICD-10-CM

## 2018-07-28 DIAGNOSIS — R1319 Other dysphagia: Secondary | ICD-10-CM

## 2018-07-28 DIAGNOSIS — D649 Anemia, unspecified: Secondary | ICD-10-CM

## 2018-07-28 HISTORY — DX: Anemia, unspecified: D64.9

## 2018-07-28 LAB — CBC
HCT: 29.3 % — ABNORMAL LOW (ref 35.0–45.0)
Hemoglobin: 8.9 g/dL — ABNORMAL LOW (ref 11.7–15.5)
MCH: 21.1 pg — ABNORMAL LOW (ref 27.0–33.0)
MCHC: 30.4 g/dL — ABNORMAL LOW (ref 32.0–36.0)
MCV: 69.4 fL — ABNORMAL LOW (ref 80.0–100.0)
MPV: 10 fL (ref 7.5–12.5)
Platelets: 398 10*3/uL (ref 140–400)
RBC: 4.22 10*6/uL (ref 3.80–5.10)
RDW: 15.1 % — ABNORMAL HIGH (ref 11.0–15.0)
WBC: 8.2 10*3/uL (ref 3.8–10.8)

## 2018-07-28 MED ORDER — SUPREP BOWEL PREP KIT 17.5-3.13-1.6 GM/177ML PO SOLN: 1.0000 | Freq: Once | ORAL | 0 refills | Status: AC

## 2018-07-28 NOTE — Patient Instructions (Signed)
The risks of bleeding, perforation and infection were reviewed with patient.  

## 2018-07-28 NOTE — Progress Notes (Signed)
Subjective:    Patient ID: Elizabeth [name], female    DOB: [birth year], 77 y.o.   MRN: [id number]  HPI Referred by Dr. Laurance Flatten for anemia. Recent hemoglobin showed Hemoglobin of 9.8. Has not seen any blood in her stool. No change in her stool. BMs are regular. Appetite is okay. 07/14/2018 Fecal occult blood positive. She has GERD.  She sometimes has dysphagia. Has had an ED in the past by Dr. Sharlett Iles. Can only eat soft foods right now because of her foods.  Retired Therapist, sports from [employer].  She had the CBC drawn after she had a tooth extraction.    Hx of adenomatous polyp  Her last colonoscopy was in January of 2010 by Verl Blalock: unexplained diarrhea.  Internal hemorrhoids, other wise normal exam.  Biopsy:  COLON, RANDOM BIOPSIES: BENIGN COLONIC MUCOSA. NO ACTIVE INFLAMMATION, MICROSCOPIC COLITIS OR GRANULOMAS. 188.5     06/29/2018 ferritin 10, Vitamin B12 397.  Iron/TIBC/Ferritin/ %Sat  Hx of CVA and maintained on Plavix     Component Value Date/Time   IRON 20 (L) 06/29/2018 1206   TIBC 545 (H) 06/29/2018 1206   FERRITIN 10 (L) 06/29/2018 1206   IRONPCTSAT 4 (LL) 06/29/2018 1206     CBC    Component Value Date/Time   WBC 8.4 06/29/2018 1206   WBC 6.1 05/11/2018 0637   RBC 4.52 06/29/2018 1206   RBC 4.04 05/11/2018 0637   HGB 9.8 (L) 06/29/2018 1206   HCT 32.2 (L) 06/29/2018 1206   PLT 439 06/29/2018 1206   MCV 71 (L) 06/29/2018 1206   MCH 21.7 (L) 06/29/2018 1206   MCH 22.8 (L) 05/11/2018 0637   MCHC 30.4 (L) 06/29/2018 1206   MCHC 30.1 05/11/2018 0637   RDW 15.2 06/29/2018 1206   LYMPHSABS 1.8 06/29/2018 1206   MONOABS 0.5 01/21/2017 1015   EOSABS 0.1 06/29/2018 1206   BASOSABS 0.1 06/29/2018 1206    . Past Medical History:  Diagnosis Date  . Active smoker   . Allergy   . Anxiety    Takes Xanax for anxiety  . Arthritis   . Asthma   . Cataract   . Chronic airway obstruction, not elsewhere classified   . Clotting disorder ([place])   . Depression     . Diarrhea   . Diverticulosis of colon (without mention of hemorrhage)   . Esophageal reflux   . Family history of malignant neoplasm of gastrointestinal tract   . Headache(784.0)    Irregular  . Neuromuscular disorder ([place])   . Obesity, unspecified   . Other and unspecified hyperlipidemia   . Personal history of colonic polyps   . Stricture and stenosis of esophagus   . Stroke ([place])   . Thyroid disease   . Type II or unspecified type diabetes mellitus without mention of complication, not stated as uncontrolled   . Unspecified asthma(493.90)   . Unspecified essential hypertension     Past Surgical History:  Procedure Laterality Date  . APPENDECTOMY    . BACK SURGERY     Spinal surgery  . CARDIAC CATHETERIZATION  2006   LAD: 30%, RCA : 20%, normal EF, elevated LVEDP  . CARDIAC CATHETERIZATION    . CEREBRAL ANGIOGRAM  12/20/2015  . IR ANGIO INTRA EXTRACRAN SEL COM CAROTID INNOMINATE BILAT MOD SED  01/14/2018  . IR ANGIO INTRA EXTRACRAN SEL COM CAROTID INNOMINATE BILAT MOD SED  05/11/2018  . IR ANGIO VERTEBRAL SEL SUBCLAVIAN INNOMINATE UNI L MOD SED  01/14/2018  . IR  ANGIO VERTEBRAL SEL SUBCLAVIAN INNOMINATE UNI L MOD SED  05/11/2018  . IR ANGIO VERTEBRAL SEL VERTEBRAL UNI R MOD SED  01/14/2018  . IR ANGIO VERTEBRAL SEL VERTEBRAL UNI R MOD SED  05/11/2018  . KNEE ARTHROSCOPY     left  . LUMBAR DISC SURGERY    . TONSILLECTOMY    . TOTAL ABDOMINAL HYSTERECTOMY     menopause/bleeding    Allergies  Allergen Reactions  . Banana Anaphylaxis  . Codeine Nausea And Vomiting    PROJECTILE VOMITING  . Stevia [Stevioside] Swelling    Face, tongue, and eye swelling   . Actos [Pioglitazone] Swelling  . Benicar [Olmesartan Medoxomil] Other (See Comments)    Feel bad  . Chantix [Varenicline] Other (See Comments)    "Worked opposite."  . Diovan [Valsartan] Other (See Comments)    Feel bad  . Latex     REACTION: red rash  . Lipitor [Atorvastatin] Other (See Comments)    REACTION:  Joint  pain  . Spinach     Stomach problems  . Vicodin [Hydrocodone-Acetaminophen] Nausea And Vomiting  . Zocor [Simvastatin] Other (See Comments)    REACTION:  "made liver function incorrectly"  . Ace Inhibitors Cough  . Celebrex [Celecoxib] Rash  . Penicillins Rash  . Sulfonamide Derivatives Rash    Current Outpatient Medications on [name] Prior to Visit  Medication Sig Dispense Refill  . albuterol (PROAIR HFA) 108 (90 Base) MCG/ACT inhaler INHALE 2 PUFFS EVERY SIX HOURS AS NEEDED FOR WHEEZING OR SHORTNESS OF BREATH 8.5 g 2  . amLODipine (NORVASC) 10 MG tablet Take 1 tablet (10 mg total) by mouth daily. 90 tablet 1  . aspirin 325 MG EC tablet Take 325 mg by mouth daily.    . Carboxymethylcellulose Sodium (REFRESH LIQUIGEL) 1 % GEL Place 1 drop into both eyes 2 (two) times daily as needed (dry eyes).    . CINNAMON PO Take 2,000 mg by mouth every other day.     . clopidogrel (PLAVIX) 75 MG tablet TAKE ONE TABLET BY MOUTH DAILY (PLEASE USE AUROBINDO BRAND, PT HD FOR ALLERGY TO REDDY) 90 tablet 1  . cyproheptadine (PERIACTIN) 4 MG tablet Take 2 mg by mouth daily as needed for allergies.    Marland Kitchen escitalopram (LEXAPRO) 10 MG tablet TAKE 1 TABLET(10 MG) BY MOUTH DAILY 90 tablet 0  . glucose blood test strip Check BS BID and PRN. DX E11.9 100 each 11  . hydrochlorothiazide (MICROZIDE) 12.5 MG capsule TAKE ONE CAPSULE BY MOUTH EVERY DAY (Patient taking differently: Take 12.5 mg by mouth daily. ) 90 capsule 1  . JANUVIA 50 MG tablet TAKE ONE TABLET BY MOUTH DAILY 30 tablet 2  . metFORMIN (GLUCOPHAGE) 1000 MG tablet TAKE ONE TABLET BY MOUTH EVERY MORNING WITH BREAKFAST and TAKE 1/2 TABLET EVERY EVENING WITH FOOD (Patient taking differently: Take 1,000 mg by mouth daily with breakfast. TAKE ONE TABLET BY MOUTH EVERY MORNING WITH BREAKFAST) 135 tablet 0  . metoCLOPramide (REGLAN) 10 MG tablet TAKE ONE TABLET BY MOUTH 1 TIME DAILY AS NEEDED (Patient taking differently: Take 10 mg by mouth daily as needed for  nausea. TAKE ONE TABLET BY MOUTH 1 TIME DAILY AS NEEDED) 30 tablet 5  . montelukast (SINGULAIR) 5 MG chewable tablet chew TWO tablets BY MOUTH AT BEDTIME (Patient taking differently: Chew 2.5 mg by mouth at bedtime. chew half  tablet BY MOUTH AT BEDTIME) 60 tablet 2  . pantoprazole (PROTONIX) 40 MG tablet TAKE 1 TABLET BY MOUTH EVERY DAY --  FILL ONLY WHEN PATIENT REQUESTS 03/14/2016 KR-- 90 tablet 1  . Vitamin D, Ergocalciferol, (DRISDOL) 50000 units CAPS capsule TAKE 1 CAPSULE BY MOUTH EVERY SEVEN DAYS 12 capsule 3  . [DISCONTINUED] ramipril (ALTACE) 10 MG capsule Take 10 mg by mouth daily.       No current facility-administered medications on [name] prior to visit.          Objective:   Physical Exam Blood pressure (!) 164/60, pulse 68, temperature 98.5 F (36.9 C), height 5\' 2"  (1.575 m), weight 188 lb 8 oz (85.5 kg). Alert and oriented. Skin warm and dry. Oral mucosa is moist.   . Sclera anicteric, conjunctivae is pink. Thyroid not enlarged. No cervical lymphadenopathy. Lungs clear. Heart regular rate and rhythm.  Abdomen is soft. Bowel sounds are positive. No hepatomegaly. No abdominal masses felt. No tenderness.  No edema to lower extremities.          Assessment & Plan:  IDA. Needs colonoscopy to rule out colon carcinoma. Dysphagia: EGD/ED.  To rule out stricture. Hx of same.

## 2018-07-28 NOTE — Telephone Encounter (Signed)
Patient needs suprep 

## 2018-08-03 ENCOUNTER — Telehealth (INDEPENDENT_AMBULATORY_CARE_PROVIDER_SITE_OTHER): Payer: Self-pay | Admitting: *Deleted

## 2018-08-03 ENCOUNTER — Encounter (INDEPENDENT_AMBULATORY_CARE_PROVIDER_SITE_OTHER): Payer: Self-pay | Admitting: *Deleted

## 2018-08-03 NOTE — Telephone Encounter (Signed)
FYI: Per Dr Estanislado Pandy patient can stop Plavix 5 days days prior to TCS/EGD sch'd 08/09/18 but must continue ASA -- patient aware

## 2018-08-03 NOTE — Telephone Encounter (Signed)
Recommendations noted.  Will continue aspirin as recommended but stopped clopidogrel for 5 days.

## 2018-08-09 ENCOUNTER — Ambulatory Visit (HOSPITAL_COMMUNITY)
Admission: RE | Admit: 2018-08-09 | Discharge: 2018-08-09 | Disposition: A | Discharge status: Home / Self Care | Payer: PPO | Source: Ambulatory Visit | Attending: Internal Medicine | Admitting: Internal Medicine

## 2018-08-09 ENCOUNTER — Other Ambulatory Visit: Payer: Self-pay

## 2018-08-09 ENCOUNTER — Encounter (HOSPITAL_COMMUNITY)
Admission: RE | Disposition: A | Discharge status: Home / Self Care | Payer: Self-pay | Source: Ambulatory Visit | Attending: Internal Medicine

## 2018-08-09 ENCOUNTER — Encounter (HOSPITAL_COMMUNITY): Payer: Self-pay | Admitting: *Deleted

## 2018-08-09 DIAGNOSIS — K228 Other specified diseases of esophagus: Secondary | ICD-10-CM | POA: Insufficient documentation

## 2018-08-09 DIAGNOSIS — D649 Anemia, unspecified: Secondary | ICD-10-CM

## 2018-08-09 DIAGNOSIS — K219 Gastro-esophageal reflux disease without esophagitis: Secondary | ICD-10-CM | POA: Diagnosis not present

## 2018-08-09 DIAGNOSIS — M1990 Unspecified osteoarthritis, unspecified site: Secondary | ICD-10-CM | POA: Insufficient documentation

## 2018-08-09 DIAGNOSIS — Z885 Allergy status to narcotic agent status: Secondary | ICD-10-CM | POA: Insufficient documentation

## 2018-08-09 DIAGNOSIS — F419 Anxiety disorder, unspecified: Secondary | ICD-10-CM | POA: Insufficient documentation

## 2018-08-09 DIAGNOSIS — Z8673 Personal history of transient ischemic attack (TIA), and cerebral infarction without residual deficits: Secondary | ICD-10-CM | POA: Insufficient documentation

## 2018-08-09 DIAGNOSIS — F329 Major depressive disorder, single episode, unspecified: Secondary | ICD-10-CM | POA: Diagnosis not present

## 2018-08-09 DIAGNOSIS — D500 Iron deficiency anemia secondary to blood loss (chronic): Secondary | ICD-10-CM | POA: Insufficient documentation

## 2018-08-09 DIAGNOSIS — D125 Benign neoplasm of sigmoid colon: Secondary | ICD-10-CM | POA: Insufficient documentation

## 2018-08-09 DIAGNOSIS — Z882 Allergy status to sulfonamides status: Secondary | ICD-10-CM | POA: Diagnosis not present

## 2018-08-09 DIAGNOSIS — D122 Benign neoplasm of ascending colon: Secondary | ICD-10-CM | POA: Diagnosis not present

## 2018-08-09 DIAGNOSIS — R1314 Dysphagia, pharyngoesophageal phase: Secondary | ICD-10-CM | POA: Diagnosis not present

## 2018-08-09 DIAGNOSIS — I10 Essential (primary) hypertension: Secondary | ICD-10-CM | POA: Insufficient documentation

## 2018-08-09 DIAGNOSIS — Z7984 Long term (current) use of oral hypoglycemic drugs: Secondary | ICD-10-CM | POA: Insufficient documentation

## 2018-08-09 DIAGNOSIS — D127 Benign neoplasm of rectosigmoid junction: Secondary | ICD-10-CM | POA: Diagnosis not present

## 2018-08-09 DIAGNOSIS — J449 Chronic obstructive pulmonary disease, unspecified: Secondary | ICD-10-CM | POA: Diagnosis not present

## 2018-08-09 DIAGNOSIS — K449 Diaphragmatic hernia without obstruction or gangrene: Secondary | ICD-10-CM | POA: Diagnosis not present

## 2018-08-09 DIAGNOSIS — K5730 Diverticulosis of large intestine without perforation or abscess without bleeding: Secondary | ICD-10-CM | POA: Diagnosis not present

## 2018-08-09 DIAGNOSIS — Z880 Allergy status to penicillin: Secondary | ICD-10-CM | POA: Diagnosis not present

## 2018-08-09 DIAGNOSIS — E119 Type 2 diabetes mellitus without complications: Secondary | ICD-10-CM | POA: Diagnosis not present

## 2018-08-09 DIAGNOSIS — E785 Hyperlipidemia, unspecified: Secondary | ICD-10-CM | POA: Insufficient documentation

## 2018-08-09 DIAGNOSIS — E669 Obesity, unspecified: Secondary | ICD-10-CM | POA: Insufficient documentation

## 2018-08-09 DIAGNOSIS — F17210 Nicotine dependence, cigarettes, uncomplicated: Secondary | ICD-10-CM | POA: Diagnosis not present

## 2018-08-09 DIAGNOSIS — D123 Benign neoplasm of transverse colon: Secondary | ICD-10-CM | POA: Diagnosis not present

## 2018-08-09 DIAGNOSIS — R1310 Dysphagia, unspecified: Secondary | ICD-10-CM | POA: Insufficient documentation

## 2018-08-09 DIAGNOSIS — D508 Other iron deficiency anemias: Secondary | ICD-10-CM

## 2018-08-09 DIAGNOSIS — R1319 Other dysphagia: Secondary | ICD-10-CM

## 2018-08-09 HISTORY — PX: COLONOSCOPY: SHX5424

## 2018-08-09 HISTORY — PX: POLYPECTOMY: SHX5525

## 2018-08-09 HISTORY — PX: ESOPHAGEAL DILATION: SHX303

## 2018-08-09 HISTORY — PX: ESOPHAGOGASTRODUODENOSCOPY: SHX5428

## 2018-08-09 HISTORY — DX: Anemia, unspecified: D64.9

## 2018-08-09 LAB — GLUCOSE, CAPILLARY: Glucose-Capillary: 173 mg/dL — ABNORMAL HIGH (ref 70–99)

## 2018-08-09 SURGERY — COLONOSCOPY
Anesthesia: Moderate Sedation

## 2018-08-09 MED ORDER — SODIUM CHLORIDE 0.9 % IV SOLN
INTRAVENOUS | Status: DC
  Administered 2018-08-09: 11:00:00 via INTRAVENOUS

## 2018-08-09 MED ORDER — LIDOCAINE VISCOUS HCL 2 % MT SOLN
OROMUCOSAL | Status: DC | PRN
  Administered 2018-08-09: 1 via OROMUCOSAL

## 2018-08-09 MED ORDER — MIDAZOLAM HCL 5 MG/5ML IJ SOLN
INTRAMUSCULAR | Status: DC | PRN
  Administered 2018-08-09: 1 mg via INTRAVENOUS
  Administered 2018-08-09: 2 mg via INTRAVENOUS
  Administered 2018-08-09 (×3): 1 mg via INTRAVENOUS

## 2018-08-09 MED ORDER — MEPERIDINE HCL 50 MG/ML IJ SOLN
INTRAMUSCULAR | Status: DC | PRN
  Administered 2018-08-09 (×2): 25 mg via INTRAVENOUS

## 2018-08-09 MED ORDER — MEPERIDINE HCL 50 MG/ML IJ SOLN
INTRAMUSCULAR | Status: AC
  Filled 2018-08-09: qty 1

## 2018-08-09 MED ORDER — STERILE WATER FOR IRRIGATION IR SOLN
Status: DC | PRN
  Administered 2018-08-09: 1.5 mL

## 2018-08-09 MED ORDER — MIDAZOLAM HCL 5 MG/5ML IJ SOLN
INTRAMUSCULAR | Status: AC
  Filled 2018-08-09: qty 10

## 2018-08-09 MED ORDER — LIDOCAINE VISCOUS HCL 2 % MT SOLN
OROMUCOSAL | Status: AC
  Filled 2018-08-09: qty 15

## 2018-08-09 NOTE — Op Note (Signed)
[facility] Patient Name: Elizabeth [name] Procedure Date: 08/09/2018 11:36 AM MRN: [id number] Date of Birth: [birth year] Attending MD: Hildred Laser , MD CSN: [id number] Age: 77 Admit Type: Outpatient Procedure:                Upper GI endoscopy Indications:              Iron deficiency anemia secondary to chronic blood                            loss, Esophageal dysphagia Providers:                Hildred Laser, MD, Lurline Del, RN, Gerome Sam,                            RN, Nelma Rothman, Technician Referring MD:             Chipper Herb, MD Medicines:                Lidocaine spray, Meperidine 50 mg IV, Midazolam 5                            mg IV Complications:            No immediate complications. Estimated Blood Loss:     Estimated blood loss: none. Procedure:                Pre-Anesthesia Assessment:                           - Prior to the procedure, a History and Physical                            was performed, and patient medications and                            allergies were reviewed. The patient's tolerance of                            previous anesthesia was also reviewed. The risks                            and benefits of the procedure and the sedation                            options and risks were discussed with the patient.                            All questions were answered, and informed consent                            was obtained. Prior Anticoagulants: The patient                            last took aspirin 5 days and Plavix (clopidogrel) 5  days prior to the procedure. ASA Grade Assessment:                            III - A patient with severe systemic disease. After                            reviewing the risks and benefits, the patient was                            deemed in satisfactory condition to undergo the                            procedure.                           After obtaining informed consent, the  endoscope was                            passed under direct vision. Throughout the                            procedure, the patient's blood pressure, pulse, and                            oxygen saturations were monitored continuously. The                            GIF-H190 ([id number]) scope was introduced through the                            mouth, and advanced to the second part of duodenum.                            The upper GI endoscopy was accomplished without                            difficulty. The patient tolerated the procedure                            well. Scope In: 12:05:13 PM Scope Out: 12:15:17 PM Total Procedure Duration: 0 hours 10 minutes 4 seconds  Findings:      The examined esophagus was normal.      The Z-line was irregular and was found 37 cm from the incisors.      A 3 cm hiatal hernia was present.      The entire examined stomach was normal.      The duodenal bulb and second portion of the duodenum were normal.      No endoscopic abnormality was evident in the esophagus to explain the       patient's complaint of dysphagia. It was decided, however, to proceed       with dilation of the entire esophagus. The scope was withdrawn. Dilation       was performed with a Maloney dilator with no resistance at 56 Fr. The       dilation  site was examined following endoscope reinsertion and showed no       change and no bleeding, mucosal tear or perforation. Impression:               - Normal esophagus.                           - Z-line irregular, 37 cm from the incisors.                           - 3 cm hiatal hernia.                           - Normal stomach.                           - Normal duodenal bulb and second portion of the                            duodenum.                           - No endoscopic esophageal abnormality to explain                            patient's dysphagia. Esophagus dilated. Dilated.                           - No specimens  collected. Moderate Sedation:      Moderate (conscious) sedation was administered by the endoscopy nurse       and supervised by the endoscopist. The following parameters were       monitored: oxygen saturation, heart rate, blood pressure, CO2       capnography and response to care. Total physician intraservice time was       16 minutes. Recommendation:           - Patient has a contact number available for                            emergencies. The signs and symptoms of potential                            delayed complications were discussed with the                            patient. Return to normal activities tomorrow.                            Written discharge instructions were provided to the                            patient.                           - Resume previous diet today.                           -  Continue present medications.                           - See the other procedure note for documentation of                            additional recommendations. Procedure Code(s):        --- Professional ---                           [phone], Esophagogastroduodenoscopy, flexible,                            transoral; diagnostic, including collection of                            specimen(s) by brushing or washing, when performed                            (separate procedure)                           43450, Dilation of esophagus, by unguided sound or                            bougie, single or multiple passes                           G0500, Moderate sedation services provided by the                            same physician or other qualified health care                            professional performing a gastrointestinal                            endoscopic service that sedation supports,                            requiring the presence of an independent trained                            observer to assist in the monitoring of the                             patient's level of consciousness and physiological                            status; initial 15 minutes of intra-service time;                            patient age 69 years or older (additional time may                            be  reported with [phone], as appropriate) Diagnosis Code(s):        --- Professional ---                           K22.8, Other specified diseases of esophagus                           K44.9, Diaphragmatic hernia without obstruction or                            gangrene                           D50.0, Iron deficiency anemia secondary to blood                            loss (chronic)                           R13.14, Dysphagia, pharyngoesophageal phase CPT copyright 2018 American Medical Association. All rights reserved. The codes documented in this report are preliminary and upon coder review may  be revised to meet current compliance requirements. Hildred Laser, MD Hildred Laser, MD 08/09/2018 1:16:36 PM This report has been signed electronically. Number of Addenda: 0

## 2018-08-09 NOTE — Op Note (Signed)
[facility] Patient Name: Elizabeth [name] Procedure Date: 08/09/2018 12:17 PM MRN: [id number] Date of Birth: 05/16/1941 Attending MD: Hildred Laser , MD CSN: [id number] Age: 77 Admit Type: Outpatient Procedure:                Colonoscopy Indications:              Iron deficiency anemia secondary to chronic blood                            loss Providers:                Hildred Laser, MD, Charlsie Quest. Theda Sers RN, RN, Aram Candela Referring MD:             Chipper Herb, MD Medicines:                Midazolam 2 mg IV Complications:            No immediate complications. Estimated Blood Loss:     Estimated blood loss was minimal. Procedure:                Pre-Anesthesia Assessment:                           - Prior to the procedure, a History and Physical                            was performed, and patient medications and                            allergies were reviewed. The patient's tolerance of                            previous anesthesia was also reviewed. The risks                            and benefits of the procedure and the sedation                            options and risks were discussed with the patient.                            All questions were answered, and informed consent                            was obtained. Prior Anticoagulants: The patient                            last took aspirin 5 days and Plavix (clopidogrel) 5                            days prior to the procedure. ASA Grade Assessment:  III - A patient with severe systemic disease. After                            reviewing the risks and benefits, the patient was                            deemed in satisfactory condition to undergo the                            procedure.                           After obtaining informed consent, the colonoscope                            was passed under direct vision. Throughout the       procedure, the patient's blood pressure, pulse, and                            oxygen saturations were monitored continuously. The                            PCF-H190DL ([id number]) scope was introduced through                            the anus and advanced to the the cecum, identified                            by appendiceal orifice and ileocecal valve. The                            colonoscopy was performed without difficulty. The                            patient tolerated the procedure well. The quality                            of the bowel preparation was adequate to identify                            polyps 6 mm and larger in size. The ileocecal                            valve, appendiceal orifice, and rectum were                            photographed. Scope In: 12:19:59 PM Scope Out: 1:05:07 PM Scope Withdrawal Time: 0 hours 36 minutes 15 seconds  Total Procedure Duration: 0 hours 45 minutes 8 seconds  Findings:      The perianal and digital rectal examinations were normal.      Six polyps were found in the ascending colon. The polyps were 5 to 15 mm       in size. These polyps were removed with a hot snare. Resection was  complete, but the polyp tissue was only partially retrieved. The       pathology specimen was placed into Bottle Number 1.      A 8 mm polyp was found in the hepatic flexure. The polyp was sessile.       The polyp was removed with a hot snare. Resection and retrieval were       complete. The pathology specimen was placed into Bottle Number 1.      A small polyp was found in the ascending colon. The polyp was sessile.       The polyp was removed with a cold snare. Resection and retrieval were       complete. The pathology specimen was placed into Bottle Number 1.      A 12 mm polyp was found in the recto-sigmoid colon. The polyp was       sessile. The polyp was removed with a hot snare. Resection and retrieval       were complete. The pathology  specimen was placed into Bottle Number 2.      Scattered medium-mouthed diverticula were found in the sigmoid colon.      The retroflexed view of the distal rectum and anal verge was normal and       showed no anal or rectal abnormalities. Impression:               - Six 5 to 15 mm polyps in the ascending colon,                            removed with a hot snare. Complete resection.                            Partial retrieval.                           - One 8 mm polyp at the hepatic flexure, removed                            with a hot snare. Resected and retrieved.                           - One small polyp in the ascending colon, removed                            with a cold snare. Resected and retrieved.                           - One 12 mm polyp at the recto-sigmoid colon,                            removed with a hot snare. Resected and retrieved.                           - Diverticulosis in the sigmoid colon.                           Comment: patient has few more small polyps to be  removed.                           no bleeding lesion notedon EGD or colonoscopy. Moderate Sedation:      Moderate (conscious) sedation was administered by the endoscopy nurse       and supervised by the endoscopist. The following parameters were       monitored: oxygen saturation, heart rate, blood pressure, CO2       capnography and response to care. Total physician intraservice time was       50 minutes. Recommendation:           - Patient has a contact number available for                            emergencies. The signs and symptoms of potential                            delayed complications were discussed with the                            patient. Return to normal activities tomorrow.                            Written discharge instructions were provided to the                            patient.                           - High fiber diet and diabetic  (ADA) diet today.                           - Continue present medications.                           - Resume aspirin in 5 days and Plavix (clopidogrel)                            in 5 days at prior doses.                           - Await pathology results.                           - Given capsule sudy in 2 weeks.                           - Repeat colonoscopy in 6 months for surveillance. Procedure Code(s):        --- Professional ---                           [phone], Colonoscopy, flexible; with removal of                            tumor(s), polyp(s), or other lesion(s) by snare  technique                           [id number], Moderate sedation; each additional 15                            minutes intraservice time                           [id number], Moderate sedation; each additional 15                            minutes intraservice time                           G0500, Moderate sedation services provided by the                            same physician or other qualified health care                            professional performing a gastrointestinal                            endoscopic service that sedation supports,                            requiring the presence of an independent trained                            observer to assist in the monitoring of the                            patient's level of consciousness and physiological                            status; initial 15 minutes of intra-service time;                            patient age 68 years or older (additional time may                            be reported with [phone], as appropriate) Diagnosis Code(s):        --- Professional ---                           D12.2, Benign neoplasm of ascending colon                           D12.3, Benign neoplasm of transverse colon (hepatic                            flexure or splenic flexure)                           D12.7, Benign neoplasm of  rectosigmoid  junction                           D50.0, Iron deficiency anemia secondary to blood                            loss (chronic)                           K57.30, Diverticulosis of large intestine without                            perforation or abscess without bleeding CPT copyright 2018 American Medical Association. All rights reserved. The codes documented in this report are preliminary and upon coder review may  be revised to meet current compliance requirements. Hildred Laser, MD Hildred Laser, MD 08/09/2018 1:25:16 PM This report has been signed electronically. Number of Addenda: 0

## 2018-08-09 NOTE — H&P (Signed)
Elizabeth [name] is an 77 y.o. female.   Chief Complaint: Patient is here for EGD, ED and colonoscopy. HPI: She is a 77 year old Caucasian female with multiple medical problems who has chronic GERD who now presents with dysphagia to solids.  She said she had her esophagus dilated many years ago and it helped.  She believes she is dysphagia intermittently for about 8 to 9 years.  She feels heartburn is well controlled with therapy.  She has occasional midepigastric pain but denies nausea or vomiting. She is also undergoing diagnostic colonoscopy.  She was recently found to have iron deficiency anemia and stool was heme positive.  She denies melena or rectal bleeding.   Patient has been off aspirin and clopidogrel.  She takes full dose aspirin. Family history is negative for CRC.  Past Medical History:  Diagnosis Date  . IDA 07/28/2018  . Active smoker   . Allergy   . Anxiety    Takes Xanax for anxiety  . Arthritis   . Asthma   . Cataract   . Chronic airway obstruction, not elsewhere classified   . Clotting disorder ([place])   . Depression   .    Marland Kitchen Diverticulosis of colon (without mention of hemorrhage)   . Esophageal reflux   . Family history of malignant neoplasm of gastrointestinal tract   . Headache(784.0)    Irregular  . Neuromuscular disorder ([place])   . Obesity, unspecified   . Other and unspecified hyperlipidemia   . GERD       . Stroke ([place])   . Thyroid disease   . Type II or unspecified type diabetes mellitus without mention of complication, not stated as uncontrolled   . Unspecified asthma(493.90)   . Unspecified essential hypertension     Past Surgical History:  Procedure Laterality Date  . APPENDECTOMY    . BACK SURGERY     Spinal surgery  . CARDIAC CATHETERIZATION  2006   LAD: 30%, RCA : 20%, normal EF, elevated LVEDP  . CARDIAC CATHETERIZATION    . CEREBRAL ANGIOGRAM  12/20/2015  . IR ANGIO INTRA EXTRACRAN SEL COM CAROTID INNOMINATE BILAT MOD SED  01/14/2018  . IR  ANGIO INTRA EXTRACRAN SEL COM CAROTID INNOMINATE BILAT MOD SED  05/11/2018  . IR ANGIO VERTEBRAL SEL SUBCLAVIAN INNOMINATE UNI L MOD SED  01/14/2018  . IR ANGIO VERTEBRAL SEL SUBCLAVIAN INNOMINATE UNI L MOD SED  05/11/2018  . IR ANGIO VERTEBRAL SEL VERTEBRAL UNI R MOD SED  01/14/2018  . IR ANGIO VERTEBRAL SEL VERTEBRAL UNI R MOD SED  05/11/2018  . KNEE ARTHROSCOPY     left  . LUMBAR DISC SURGERY    . TONSILLECTOMY    . TOTAL ABDOMINAL HYSTERECTOMY     menopause/bleeding    Family History  Problem Relation Age of Onset  . Diabetes Mother   . Heart disease Mother   . Asthma Mother   . Kidney disease Mother   . Drug abuse Mother   . Hypertension Mother   . Mental illness Mother        ?sociopath/psychopath  . Early death Father        trauma  . Colon cancer Other        first cousin, paternal aunt and uncle  . Cancer Paternal Grandmother        gastric  . Breast cancer Maternal Aunt        several paternal cousins  . Heart disease Maternal Grandmother   . Thyroid cancer Other  aunt  . Thyroid disease Sister        uncertain type-not cancer  . Heart disease Sister   . Heart disease Brother   . Cancer Sister        pancreatic   Social History:  reports that she has been smoking cigarettes. She started smoking about 35 years ago. She has a 20.00 pack-year smoking history. She has never used smokeless tobacco. She reports that she does not drink alcohol or use drugs.  Allergies:  Allergies  Allergen Reactions  . Banana Anaphylaxis  . Codeine Nausea And Vomiting and Other (See Comments)    PROJECTILE VOMITING  . Stevia [Stevioside] Swelling and Other (See Comments)    Face, tongue, and eye swelling   . Actos [Pioglitazone] Swelling  . Benicar [Olmesartan Medoxomil] Other (See Comments)    Feel bad  . Chantix [Varenicline] Other (See Comments)    "Worked opposite."  . Diovan [Valsartan] Other (See Comments)    Feel bad  . Lipitor [Atorvastatin] Other (See Comments)     REACTION:  Joint pain  . Spinach Other (See Comments)    Stomach problems  . Vicodin [Hydrocodone-Acetaminophen] Nausea And Vomiting  . Zocor [Simvastatin] Other (See Comments)    REACTION:  "made liver function incorrectly"  . Ace Inhibitors Cough  . Celebrex [Celecoxib] Rash  . Latex Rash and Other (See Comments)    REACTION: red rash  . Penicillins Rash and Other (See Comments)    Has patient had a PCN reaction causing immediate rash, facial/tongue/throat swelling, SOB or lightheadedness with hypotension: No Has patient had a PCN reaction causing severe rash involving mucus membranes or skin necrosis: No Has patient had a PCN reaction that required hospitalization: No- MD office Has patient had a PCN reaction occurring within the last 10 years: No If all of the above answers are "NO", then may proceed with Cephalosporin use.   . Sulfonamide Derivatives Rash    Medications Prior to Admission  Medication Sig Dispense Refill  . albuterol (PROAIR HFA) 108 (90 Base) MCG/ACT inhaler INHALE 2 PUFFS EVERY SIX HOURS AS NEEDED FOR WHEEZING OR SHORTNESS OF BREATH (Patient taking differently: Inhale 1 puff into the lungs every 6 (six) hours as needed for wheezing or shortness of breath. ) 8.5 g 2  . amLODipine (NORVASC) 10 MG tablet Take 1 tablet (10 mg total) by mouth daily. 90 tablet 1  . aspirin 325 MG EC tablet Take 325 mg by mouth daily.    . Carboxymethylcellulose Sodium (REFRESH LIQUIGEL) 1 % GEL Place 1 drop into both eyes 2 (two) times daily.     Marland Kitchen CINNAMON PO Take 1 capsule by mouth daily.     . clopidogrel (PLAVIX) 75 MG tablet TAKE ONE TABLET BY MOUTH DAILY (PLEASE USE AUROBINDO BRAND, PT HD FOR ALLERGY TO REDDY) (Patient taking differently: Take 75 mg by mouth daily. ) 90 tablet 1  . cyproheptadine (PERIACTIN) 4 MG tablet Take 2 mg by mouth daily as needed for allergies.    Marland Kitchen escitalopram (LEXAPRO) 10 MG tablet TAKE 1 TABLET(10 MG) BY MOUTH DAILY (Patient taking differently: Take 10  mg by mouth daily. ) 90 tablet 0  . hydrochlorothiazide (MICROZIDE) 12.5 MG capsule TAKE ONE CAPSULE BY MOUTH EVERY DAY (Patient taking differently: Take 12.5 mg by mouth daily. ) 90 capsule 1  . JANUVIA 50 MG tablet TAKE ONE TABLET BY MOUTH DAILY (Patient taking differently: Take 50 mg by mouth daily. ) 30 tablet 2  . metFORMIN (GLUCOPHAGE)  1000 MG tablet TAKE ONE TABLET BY MOUTH EVERY MORNING WITH BREAKFAST and TAKE 1/2 TABLET EVERY EVENING WITH FOOD (Patient taking differently: Take 1,000 mg by mouth daily with breakfast. ) 135 tablet 0  . montelukast (SINGULAIR) 5 MG chewable tablet chew TWO tablets BY MOUTH AT BEDTIME (Patient taking differently: Chew 10 mg by mouth at bedtime. ) 60 tablet 2  . pantoprazole (PROTONIX) 40 MG tablet TAKE 1 TABLET BY MOUTH EVERY DAY --FILL ONLY WHEN PATIENT REQUESTS 03/14/2016 KR-- (Patient taking differently: Take 40 mg by mouth daily. ) 90 tablet 1  . Vitamin D, Ergocalciferol, (DRISDOL) 50000 units CAPS capsule TAKE 1 CAPSULE BY MOUTH EVERY SEVEN DAYS (Patient taking differently: Take 50,000 Units by mouth every Wednesday. ) 12 capsule 3  . glucose blood test strip Check BS BID and PRN. DX E11.9 (Patient not taking: Reported on 08/03/2018) 100 each 11  . metoCLOPramide (REGLAN) 10 MG tablet TAKE ONE TABLET BY MOUTH 1 TIME DAILY AS NEEDED (Patient taking differently: Take 10 mg by mouth daily as needed for nausea or vomiting. ) 30 tablet 5    Results for orders placed or performed during the hospital encounter of 08/09/18 (from the past 48 hour(s))  Glucose, capillary     Status: Abnormal   Collection Time: 08/09/18 10:44 AM  Result Value Ref Range   Glucose-Capillary 173 (H) 70 - 99 mg/dL   No results found.  ROS  Blood pressure (!) 163/72, pulse 87, temperature 98.7 F (37.1 C), temperature source Oral, resp. rate 20, SpO2 98 %. Physical Exam  Constitutional: She appears well-developed and well-nourished.  HENT:  Mouth/Throat: Oropharynx is clear and  moist.  Eyes: Conjunctivae are normal. No scleral icterus.  Neck: No thyromegaly present.  Cardiovascular: Normal rate, regular rhythm and normal heart sounds.  No murmur heard. Respiratory: Effort normal and breath sounds normal.  GI:  Abdomen is full.  Pfannenstiel scar.  On palpation is soft.  She has mild midepigastric tenderness.  No organomegaly or masses.  Musculoskeletal: She exhibits no edema.  Lymphadenopathy:    She has no cervical adenopathy.  Neurological: She is alert.  Skin: Skin is warm and dry.     Assessment/Plan Solid food dysphagia. Iron deficiency anemia and heme positive stool. EGD with ED and diagnostic colonoscopy.  Hildred Laser, MD 08/09/2018, 11:54 AM

## 2018-08-09 NOTE — Discharge Instructions (Signed)
Colon Polyps Polyps are tissue growths inside the body. Polyps can grow in many places, including the large intestine (colon). A polyp may be a round bump or a mushroom-shaped growth. You could have one polyp or several. Most colon polyps are noncancerous (benign). However, some colon polyps can become cancerous over time. What are the causes? The exact cause of colon polyps is not known. What increases the risk? This condition is more likely to develop in people who:  Have a family history of colon cancer or colon polyps.  Are older than 34 or older than 45 if they are African American.  Have inflammatory bowel disease, such as ulcerative colitis or Crohn disease.  Are overweight.  Smoke cigarettes.  Do not get enough exercise.  Drink too much alcohol.  Eat a diet that is: ? High in fat and red meat. ? Low in fiber.  Had childhood cancer that was treated with abdominal radiation.  What are the signs or symptoms? Most polyps do not cause symptoms. If you have symptoms, they may include:  Blood coming from your rectum when having a bowel movement.  Blood in your stool.The stool may look dark red or black.  A change in bowel habits, such as constipation or diarrhea.  How is this diagnosed? This condition is diagnosed with a colonoscopy. This is a procedure that uses a lighted, flexible scope to look at the inside of your colon. How is this treated? Treatment for this condition involves removing any polyps that are found. Those polyps will then be tested for cancer. If cancer is found, your health care provider will talk to you about options for colon cancer treatment. Follow these instructions at home: Diet  Eat plenty of fiber, such as fruits, vegetables, and whole grains.  Eat foods that are high in calcium and vitamin D, such as milk, cheese, yogurt, eggs, liver, fish, and broccoli.  Limit foods high in fat, red meats, and processed meats, such as hot dogs, sausage,  bacon, and lunch meats.  Maintain a healthy weight, or lose weight if recommended by your health care provider. General instructions  Do not smoke cigarettes.  Do not drink alcohol excessively.  Keep all follow-up visits as told by your health care provider. This is important. This includes keeping regularly scheduled colonoscopies. Talk to your health care provider about when you need a colonoscopy.  Exercise every day or as told by your health care provider. Contact a health care provider if:  You have new or worsening bleeding during a bowel movement.  You have new or increased blood in your stool.  You have a change in bowel habits.  You unexpectedly lose weight. This information is not intended to replace advice given to you by your health care provider. Make sure you discuss any questions you have with your health care provider. Document Released: 05/21/2004 Document Revised: 01/31/2016 Document Reviewed: 07/16/2015 Elsevier Interactive Patient Education  2018 [employer]. Colonoscopy, Adult, Care After This sheet gives you information about how to care for yourself after your procedure. Your health care provider may also give you more specific instructions. If you have problems or questions, contact your health care provider. What can I expect after the procedure? After the procedure, it is common to have:  A small amount of blood in your stool for 24 hours after the procedure.  Some gas.  Mild abdominal cramping or bloating.  Follow these instructions at home: General instructions   For the first 24 hours after  the procedure: ? Do not drive or use machinery. ? Do not sign important documents. ? Do not drink alcohol. ? Do your regular daily activities at a slower pace than normal. ? Eat soft, easy-to-digest foods. ? Rest often.  Take over-the-counter or prescription medicines only as told by your health care provider.  It is up to you to get the results of  your procedure. Ask your health care provider, or the department performing the procedure, when your results will be ready. Relieving cramping and bloating  Try walking around when you have cramps or feel bloated.  Apply heat to your abdomen as told by your health care provider. Use a heat source that your health care provider recommends, such as a moist heat pack or a heating pad. ? Place a towel between your skin and the heat source. ? Leave the heat on for 20-30 minutes. ? Remove the heat if your skin turns bright red. This is especially important if you are unable to feel pain, heat, or cold. You may have a greater risk of getting burned. Eating and drinking  Drink enough fluid to keep your urine clear or pale yellow.  Resume your normal diet as instructed by your health care provider. Avoid heavy or fried foods that are hard to digest.  Avoid drinking alcohol for as long as instructed by your health care provider. Contact a health care provider if:  You have blood in your stool 2-3 days after the procedure. Get help right away if:  You have more than a small spotting of blood in your stool.  You pass large blood clots in your stool.  Your abdomen is swollen.  You have nausea or vomiting.  You have a fever.  You have increasing abdominal pain that is not relieved with medicine. This information is not intended to replace advice given to you by your health care provider. Make sure you discuss any questions you have with your health care provider. Document Released: 04/08/2004 Document Revised: 05/19/2016 Document Reviewed: 11/06/2015 Elsevier Interactive Patient Education  2018 [employer]. Resume aspirin and clopidogrel on 08/14/2018. Resume other medications as before. Resume modified carb high-fiber diet. No driving for 24 hours. Physician will call with biopsy results.

## 2018-08-11 ENCOUNTER — Other Ambulatory Visit (INDEPENDENT_AMBULATORY_CARE_PROVIDER_SITE_OTHER): Payer: Self-pay | Admitting: *Deleted

## 2018-08-11 DIAGNOSIS — D509 Iron deficiency anemia, unspecified: Secondary | ICD-10-CM | POA: Insufficient documentation

## 2018-08-12 ENCOUNTER — Encounter (INDEPENDENT_AMBULATORY_CARE_PROVIDER_SITE_OTHER): Payer: Self-pay | Admitting: *Deleted

## 2018-08-12 ENCOUNTER — Encounter (HOSPITAL_COMMUNITY): Payer: Self-pay | Admitting: Internal Medicine

## 2018-08-17 ENCOUNTER — Other Ambulatory Visit: Payer: Self-pay

## 2018-08-17 ENCOUNTER — Inpatient Hospital Stay (HOSPITAL_COMMUNITY)
Admission: EM | Admit: 2018-08-17 | Discharge: 2018-08-18 | DRG: 920 | Disposition: A | Discharge status: Home / Self Care | Payer: PPO | Attending: Internal Medicine | Admitting: Internal Medicine

## 2018-08-17 ENCOUNTER — Encounter (HOSPITAL_COMMUNITY): Payer: Self-pay | Admitting: *Deleted

## 2018-08-17 DIAGNOSIS — I959 Hypotension, unspecified: Secondary | ICD-10-CM | POA: Diagnosis not present

## 2018-08-17 DIAGNOSIS — K625 Hemorrhage of anus and rectum: Secondary | ICD-10-CM

## 2018-08-17 DIAGNOSIS — Y848 Other medical procedures as the cause of abnormal reaction of the patient, or of later complication, without mention of misadventure at the time of the procedure: Secondary | ICD-10-CM | POA: Diagnosis present

## 2018-08-17 DIAGNOSIS — J449 Chronic obstructive pulmonary disease, unspecified: Secondary | ICD-10-CM | POA: Diagnosis present

## 2018-08-17 DIAGNOSIS — Z9102 Food additives allergy status: Secondary | ICD-10-CM

## 2018-08-17 DIAGNOSIS — K91840 Postprocedural hemorrhage and hematoma of a digestive system organ or structure following a digestive system procedure: Principal | ICD-10-CM | POA: Diagnosis present

## 2018-08-17 DIAGNOSIS — D62 Acute posthemorrhagic anemia: Secondary | ICD-10-CM | POA: Diagnosis present

## 2018-08-17 DIAGNOSIS — E1159 Type 2 diabetes mellitus with other circulatory complications: Secondary | ICD-10-CM | POA: Diagnosis present

## 2018-08-17 DIAGNOSIS — D649 Anemia, unspecified: Secondary | ICD-10-CM | POA: Diagnosis not present

## 2018-08-17 DIAGNOSIS — Z860100 Personal history of colon polyps, unspecified: Secondary | ICD-10-CM

## 2018-08-17 DIAGNOSIS — E1169 Type 2 diabetes mellitus with other specified complication: Secondary | ICD-10-CM | POA: Diagnosis present

## 2018-08-17 DIAGNOSIS — E669 Obesity, unspecified: Secondary | ICD-10-CM | POA: Diagnosis not present

## 2018-08-17 DIAGNOSIS — D509 Iron deficiency anemia, unspecified: Secondary | ICD-10-CM | POA: Diagnosis present

## 2018-08-17 DIAGNOSIS — Z79899 Other long term (current) drug therapy: Secondary | ICD-10-CM

## 2018-08-17 DIAGNOSIS — E871 Hypo-osmolality and hyponatremia: Secondary | ICD-10-CM | POA: Diagnosis present

## 2018-08-17 DIAGNOSIS — Z7901 Long term (current) use of anticoagulants: Secondary | ICD-10-CM

## 2018-08-17 DIAGNOSIS — I10 Essential (primary) hypertension: Secondary | ICD-10-CM | POA: Diagnosis present

## 2018-08-17 DIAGNOSIS — K219 Gastro-esophageal reflux disease without esophagitis: Secondary | ICD-10-CM | POA: Diagnosis not present

## 2018-08-17 DIAGNOSIS — I639 Cerebral infarction, unspecified: Secondary | ICD-10-CM | POA: Diagnosis present

## 2018-08-17 DIAGNOSIS — E872 Acidosis: Secondary | ICD-10-CM | POA: Diagnosis present

## 2018-08-17 DIAGNOSIS — Z6834 Body mass index (BMI) 34.0-34.9, adult: Secondary | ICD-10-CM | POA: Diagnosis not present

## 2018-08-17 DIAGNOSIS — Z825 Family history of asthma and other chronic lower respiratory diseases: Secondary | ICD-10-CM

## 2018-08-17 DIAGNOSIS — K760 Fatty (change of) liver, not elsewhere classified: Secondary | ICD-10-CM | POA: Diagnosis present

## 2018-08-17 DIAGNOSIS — K449 Diaphragmatic hernia without obstruction or gangrene: Secondary | ICD-10-CM | POA: Diagnosis present

## 2018-08-17 DIAGNOSIS — Z818 Family history of other mental and behavioral disorders: Secondary | ICD-10-CM

## 2018-08-17 DIAGNOSIS — K5730 Diverticulosis of large intestine without perforation or abscess without bleeding: Secondary | ICD-10-CM | POA: Diagnosis present

## 2018-08-17 DIAGNOSIS — Z8249 Family history of ischemic heart disease and other diseases of the circulatory system: Secondary | ICD-10-CM

## 2018-08-17 DIAGNOSIS — Z7982 Long term (current) use of aspirin: Secondary | ICD-10-CM

## 2018-08-17 DIAGNOSIS — Z833 Family history of diabetes mellitus: Secondary | ICD-10-CM

## 2018-08-17 DIAGNOSIS — E860 Dehydration: Secondary | ICD-10-CM | POA: Diagnosis not present

## 2018-08-17 DIAGNOSIS — Z885 Allergy status to narcotic agent status: Secondary | ICD-10-CM

## 2018-08-17 DIAGNOSIS — Z91018 Allergy to other foods: Secondary | ICD-10-CM

## 2018-08-17 DIAGNOSIS — R011 Cardiac murmur, unspecified: Secondary | ICD-10-CM | POA: Diagnosis not present

## 2018-08-17 DIAGNOSIS — Z7984 Long term (current) use of oral hypoglycemic drugs: Secondary | ICD-10-CM

## 2018-08-17 DIAGNOSIS — F419 Anxiety disorder, unspecified: Secondary | ICD-10-CM | POA: Diagnosis not present

## 2018-08-17 DIAGNOSIS — E785 Hyperlipidemia, unspecified: Secondary | ICD-10-CM | POA: Diagnosis present

## 2018-08-17 DIAGNOSIS — Z91040 Latex allergy status: Secondary | ICD-10-CM

## 2018-08-17 DIAGNOSIS — Z888 Allergy status to other drugs, medicaments and biological substances status: Secondary | ICD-10-CM

## 2018-08-17 DIAGNOSIS — Z720 Tobacco use: Secondary | ICD-10-CM

## 2018-08-17 DIAGNOSIS — E6609 Other obesity due to excess calories: Secondary | ICD-10-CM | POA: Diagnosis not present

## 2018-08-17 DIAGNOSIS — F329 Major depressive disorder, single episode, unspecified: Secondary | ICD-10-CM | POA: Diagnosis not present

## 2018-08-17 DIAGNOSIS — Z7902 Long term (current) use of antithrombotics/antiplatelets: Secondary | ICD-10-CM

## 2018-08-17 DIAGNOSIS — F17210 Nicotine dependence, cigarettes, uncomplicated: Secondary | ICD-10-CM | POA: Diagnosis not present

## 2018-08-17 DIAGNOSIS — Z880 Allergy status to penicillin: Secondary | ICD-10-CM

## 2018-08-17 DIAGNOSIS — Z90710 Acquired absence of both cervix and uterus: Secondary | ICD-10-CM | POA: Diagnosis not present

## 2018-08-17 DIAGNOSIS — Z886 Allergy status to analgesic agent status: Secondary | ICD-10-CM

## 2018-08-17 DIAGNOSIS — Z8673 Personal history of transient ischemic attack (TIA), and cerebral infarction without residual deficits: Secondary | ICD-10-CM | POA: Diagnosis not present

## 2018-08-17 DIAGNOSIS — E1165 Type 2 diabetes mellitus with hyperglycemia: Secondary | ICD-10-CM | POA: Diagnosis present

## 2018-08-17 DIAGNOSIS — Z882 Allergy status to sulfonamides status: Secondary | ICD-10-CM

## 2018-08-17 LAB — OSMOLALITY: Osmolality: 285 mOsm/kg (ref 275–295)

## 2018-08-17 LAB — I-STAT CG4 LACTIC ACID, ED: Lactic Acid, Venous: 4.25 mmol/L (ref 0.5–1.9)

## 2018-08-17 LAB — CBC WITH DIFFERENTIAL/PLATELET
Abs Immature Granulocytes: 0.04 10*3/uL (ref 0.00–0.07)
Basophils Absolute: 0.1 10*3/uL (ref 0.0–0.1)
Basophils Relative: 1 %
Eosinophils Absolute: 0.2 10*3/uL (ref 0.0–0.5)
Eosinophils Relative: 2 %
HCT: 25.1 % — ABNORMAL LOW (ref 36.0–46.0)
Hemoglobin: 7.2 g/dL — ABNORMAL LOW (ref 12.0–15.0)
Immature Granulocytes: 0 %
Lymphocytes Relative: 18 %
Lymphs Abs: 1.6 10*3/uL (ref 0.7–4.0)
MCH: 20 pg — ABNORMAL LOW (ref 26.0–34.0)
MCHC: 28.7 g/dL — ABNORMAL LOW (ref 30.0–36.0)
MCV: 69.7 fL — ABNORMAL LOW (ref 80.0–100.0)
Monocytes Absolute: 0.6 10*3/uL (ref 0.1–1.0)
Monocytes Relative: 7 %
Neutro Abs: 6.5 10*3/uL (ref 1.7–7.7)
Neutrophils Relative %: 72 %
Platelets: 393 10*3/uL (ref 150–400)
RBC: 3.6 MIL/uL — ABNORMAL LOW (ref 3.87–5.11)
RDW: 16.6 % — ABNORMAL HIGH (ref 11.5–15.5)
WBC: 8.9 10*3/uL (ref 4.0–10.5)
nRBC: 0 % (ref 0.0–0.2)

## 2018-08-17 LAB — PREPARE RBC (CROSSMATCH)

## 2018-08-17 LAB — GLUCOSE, CAPILLARY
Glucose-Capillary: 135 mg/dL — ABNORMAL HIGH (ref 70–99)
Glucose-Capillary: 154 mg/dL — ABNORMAL HIGH (ref 70–99)

## 2018-08-17 LAB — ABO/RH: ABO/RH(D): O NEG

## 2018-08-17 LAB — COMPREHENSIVE METABOLIC PANEL
ALT: 45 U/L — ABNORMAL HIGH (ref 0–44)
AST: 66 U/L — ABNORMAL HIGH (ref 15–41)
Albumin: 3.6 g/dL (ref 3.5–5.0)
Alkaline Phosphatase: 82 U/L (ref 38–126)
Anion gap: 12 (ref 5–15)
BUN: 11 mg/dL (ref 8–23)
CO2: 24 mmol/L (ref 22–32)
Calcium: 9.4 mg/dL (ref 8.9–10.3)
Chloride: 93 mmol/L — ABNORMAL LOW (ref 98–111)
Creatinine, Ser: 0.81 mg/dL (ref 0.44–1.00)
GFR calc Af Amer: >60 mL/min (ref >60)
GFR calc non Af Amer: >60 mL/min (ref >60)
Glucose, Bld: 291 mg/dL — ABNORMAL HIGH (ref 70–99)
Potassium: 3.6 mmol/L (ref 3.5–5.1)
Sodium: 129 mmol/L — ABNORMAL LOW (ref 135–145)
Total Bilirubin: 0.7 mg/dL (ref 0.3–1.2)
Total Protein: 6.8 g/dL (ref 6.5–8.1)

## 2018-08-17 LAB — TSH: TSH: 1.638 u[IU]/mL (ref 0.350–4.500)

## 2018-08-17 LAB — LIPASE, BLOOD: Lipase: 30 U/L (ref 11–51)

## 2018-08-17 LAB — PROTIME-INR
INR: 1.19
Prothrombin Time: 15 seconds (ref 11.4–15.2)

## 2018-08-17 MED ORDER — PANTOPRAZOLE SODIUM 40 MG IV SOLR
40.0000 mg | Freq: Once | INTRAVENOUS | Status: AC
  Administered 2018-08-17: 40 mg via INTRAVENOUS
  Filled 2018-08-17: qty 40

## 2018-08-17 MED ORDER — PANTOPRAZOLE SODIUM 40 MG PO TBEC
40.0000 mg | DELAYED_RELEASE_TABLET | Freq: Once | ORAL | Status: DC
  Filled 2018-08-17: qty 1

## 2018-08-17 MED ORDER — NICOTINE 7 MG/24HR TD PT24
7.0000 mg | MEDICATED_PATCH | Freq: Every day | TRANSDERMAL | Status: DC
  Administered 2018-08-17 – 2018-08-18 (×2): 7 mg via TRANSDERMAL
  Filled 2018-08-17 (×4): qty 1

## 2018-08-17 MED ORDER — ONDANSETRON HCL 4 MG/2ML IJ SOLN: 4.0000 mg | Freq: Four times a day (QID) | INTRAMUSCULAR | Status: DC | PRN

## 2018-08-17 MED ORDER — POLYVINYL ALCOHOL 1.4 % OP SOLN
1.0000 [drp] | Freq: Two times a day (BID) | OPHTHALMIC | Status: DC
  Administered 2018-08-17 – 2018-08-18 (×2): 1 [drp] via OPHTHALMIC
  Filled 2018-08-17 (×2): qty 15

## 2018-08-17 MED ORDER — SODIUM CHLORIDE 0.9% FLUSH: 3.0000 mL | INTRAVENOUS | Status: DC | PRN

## 2018-08-17 MED ORDER — ONDANSETRON HCL 4 MG PO TABS: 4.0000 mg | ORAL_TABLET | Freq: Four times a day (QID) | ORAL | Status: DC | PRN

## 2018-08-17 MED ORDER — SODIUM CHLORIDE 0.9 % IV BOLUS
1000.0000 mL | Freq: Once | INTRAVENOUS | Status: AC
  Administered 2018-08-17: 1000 mL via INTRAVENOUS

## 2018-08-17 MED ORDER — INSULIN ASPART 100 UNIT/ML ~~LOC~~ SOLN
0.0000 [IU] | Freq: Three times a day (TID) | SUBCUTANEOUS | Status: DC
  Administered 2018-08-18: 1 [IU] via SUBCUTANEOUS
  Administered 2018-08-18: 2 [IU] via SUBCUTANEOUS

## 2018-08-17 MED ORDER — INSULIN ASPART 100 UNIT/ML ~~LOC~~ SOLN: 0.0000 [IU] | Freq: Every day | SUBCUTANEOUS | Status: DC

## 2018-08-17 MED ORDER — AMLODIPINE BESYLATE 5 MG PO TABS
10.0000 mg | ORAL_TABLET | Freq: Every day | ORAL | Status: DC
  Administered 2018-08-18: 10 mg via ORAL
  Filled 2018-08-17: qty 2

## 2018-08-17 MED ORDER — SODIUM CHLORIDE 0.9 % IV SOLN: 250.0000 mL | INTRAVENOUS | Status: DC | PRN

## 2018-08-17 MED ORDER — MONTELUKAST SODIUM 10 MG PO TABS
10.0000 mg | ORAL_TABLET | Freq: Every day | ORAL | Status: DC
  Administered 2018-08-17: 10 mg via ORAL
  Filled 2018-08-17: qty 1

## 2018-08-17 MED ORDER — SALINE SPRAY 0.65 % NA SOLN
1.0000 | NASAL | Status: DC | PRN
  Filled 2018-08-17: qty 44

## 2018-08-17 MED ORDER — ESCITALOPRAM OXALATE 10 MG PO TABS
10.0000 mg | ORAL_TABLET | Freq: Every day | ORAL | Status: DC
  Administered 2018-08-18: 10 mg via ORAL
  Filled 2018-08-17: qty 1

## 2018-08-17 MED ORDER — MONTELUKAST SODIUM 5 MG PO CHEW
10.0000 mg | CHEWABLE_TABLET | Freq: Every day | ORAL | Status: DC
  Filled 2018-08-17: qty 2

## 2018-08-17 MED ORDER — OXYMETAZOLINE HCL 0.05 % NA SOLN: 1.0000 | Freq: Once | NASAL | Status: DC

## 2018-08-17 MED ORDER — SODIUM CHLORIDE 0.9% IV SOLUTION
Freq: Once | INTRAVENOUS | Status: AC
  Administered 2018-08-17: 16:00:00 via INTRAVENOUS

## 2018-08-17 MED ORDER — PANTOPRAZOLE SODIUM 40 MG IV SOLR
40.0000 mg | Freq: Two times a day (BID) | INTRAVENOUS | Status: DC
  Administered 2018-08-17: 40 mg via INTRAVENOUS
  Filled 2018-08-17: qty 40

## 2018-08-17 MED ORDER — SODIUM CHLORIDE 0.9% FLUSH
3.0000 mL | Freq: Two times a day (BID) | INTRAVENOUS | Status: DC
  Administered 2018-08-17 – 2018-08-18 (×3): 3 mL via INTRAVENOUS

## 2018-08-17 NOTE — ED Triage Notes (Signed)
Pt c/o rectal bleeding with bowel movement this am that was the color of beets, had endo and colonoscopy 08/09/2018 to investigate cause of low hgb.

## 2018-08-17 NOTE — H&P (Signed)
History and Physical    Elizabeth [name] MEQ:[id number] DOB: 04/18/1941 DOA: 08/17/2018  PCP: Chipper Herb, MD   Patient coming from: Home  Chief Complaint: Rectal bleed  HPI: Elizabeth [name] is a 77 y.o. female with medical history significant for obesity, tobacco abuse, prior CVA in 2013, hypertension, type 2 diabetes, GERD, prior esophageal dilation, diverticulosis, and colon polyps who presented to the ED with some rectal bleeding/bloody bowel movements that began this morning.  She is status post upper and lower endoscopy on 12/2 for esophageal dilation as well as polypectomy and has had no other symptoms until just this morning.  She had resumed her full dose aspirin on 12/7 as advised.  She denies any abdominal pain, nausea, vomiting, fever, or chills.  No lightheadedness or dizziness noted.  She has a Givens capsule study ordered for 12/16.   ED Course: Vital signs are noted to be stable and hemoglobin is 7.2 with baseline between 9-10.  Her lactic acid is 4.25.  Sodium 129 and glucose 291.  Her INR is 1.19.  Case was discussed with Dr. Laural Golden of GI who advises admission for transfusion with further evaluation.  1 L fluid as well as 2 units of PRBCs have been ordered and IV Protonix has been administered.  Patient is on clear liquid diet.  Review of Systems: All others reviewed and otherwise negative except as noted above.  Past Medical History:  Diagnosis Date  . Absolute anemia 07/28/2018  . Active smoker   . Allergy   . Anxiety    Takes Xanax for anxiety  . Arthritis   . Asthma   . Cataract   . Chronic airway obstruction, not elsewhere classified   . Clotting disorder ([place])   . Depression   . Diarrhea   . Diverticulosis of colon (without mention of hemorrhage)   . Esophageal reflux   . Family history of malignant neoplasm of gastrointestinal tract   . Headache(784.0)    Irregular  . Neuromuscular disorder ([place])   . Obesity, unspecified   . Other and unspecified  hyperlipidemia   . Personal history of colonic polyps   . Stricture and stenosis of esophagus   . Stroke ([place])   . Thyroid disease   . Type II or unspecified type diabetes mellitus without mention of complication, not stated as uncontrolled   . Unspecified asthma(493.90)   . Unspecified essential hypertension     Past Surgical History:  Procedure Laterality Date  . APPENDECTOMY    . BACK SURGERY     Spinal surgery  . CARDIAC CATHETERIZATION  2006   LAD: 30%, RCA : 20%, normal EF, elevated LVEDP  . CARDIAC CATHETERIZATION    . CEREBRAL ANGIOGRAM  12/20/2015  . COLONOSCOPY N/A 08/09/2018   Procedure: COLONOSCOPY;  Surgeon: Rogene Houston, MD;  [facility];  Service: Endoscopy;  Laterality: N/A;  1:55  . ESOPHAGEAL DILATION N/A 08/09/2018   Procedure: ESOPHAGEAL DILATION;  Surgeon: Rogene Houston, MD;  Location: [facility];  Service: Endoscopy;  Laterality: N/A;  . ESOPHAGOGASTRODUODENOSCOPY N/A 08/09/2018   Procedure: ESOPHAGOGASTRODUODENOSCOPY (EGD);  Surgeon: Rogene Houston, MD;  [facility];  Service: Endoscopy;  Laterality: N/A;  . IR ANGIO INTRA EXTRACRAN SEL COM CAROTID INNOMINATE BILAT MOD SED  01/14/2018  . IR ANGIO INTRA EXTRACRAN SEL COM CAROTID INNOMINATE BILAT MOD SED  05/11/2018  . IR ANGIO VERTEBRAL SEL SUBCLAVIAN INNOMINATE UNI L MOD SED  01/14/2018  . IR ANGIO VERTEBRAL  SEL SUBCLAVIAN INNOMINATE UNI L MOD SED  05/11/2018  . IR ANGIO VERTEBRAL SEL VERTEBRAL UNI R MOD SED  01/14/2018  . IR ANGIO VERTEBRAL SEL VERTEBRAL UNI R MOD SED  05/11/2018  . KNEE ARTHROSCOPY     left  . LUMBAR DISC SURGERY    . POLYPECTOMY  08/09/2018   Procedure: POLYPECTOMY;  Surgeon: Rogene Houston, MD;  [facility]: [facility];  Service: Endoscopy;;  ascending colon (CS x 2, HS x5) hepatic flexure (HSx1), recto-sigmoid (HSx1)  . TONSILLECTOMY    . TOTAL ABDOMINAL HYSTERECTOMY     menopause/bleeding     reports that she has been smoking cigarettes. She started smoking  about 35 years ago. She has a 20.00 pack-year smoking history. She has never used smokeless tobacco. She reports that she does not drink alcohol or use drugs.  Allergies  Allergen Reactions  . Banana Anaphylaxis  . Codeine Nausea And Vomiting and Other (See Comments)    PROJECTILE VOMITING  . Stevia [Stevioside] Swelling and Other (See Comments)    Face, tongue, and eye swelling   . Actos [Pioglitazone] Swelling  . Benicar [Olmesartan Medoxomil] Other (See Comments)    Feel bad  . Chantix [Varenicline] Other (See Comments)    "Worked opposite."  . Diovan [Valsartan] Other (See Comments)    Feel bad  . Lipitor [Atorvastatin] Other (See Comments)    REACTION:  Joint pain  . Spinach Other (See Comments)    Stomach problems  . Vicodin [Hydrocodone-Acetaminophen] Nausea And Vomiting  . Zocor [Simvastatin] Other (See Comments)    REACTION:  "made liver function incorrectly"  . Ace Inhibitors Cough  . Celebrex [Celecoxib] Rash  . Latex Rash and Other (See Comments)    REACTION: red rash  . Penicillins Rash and Other (See Comments)    Has patient had a PCN reaction causing immediate rash, facial/tongue/throat swelling, SOB or lightheadedness with hypotension: No Has patient had a PCN reaction causing severe rash involving mucus membranes or skin necrosis: No Has patient had a PCN reaction that required hospitalization: No- MD office Has patient had a PCN reaction occurring within the last 10 years: No If all of the above answers are "NO", then may proceed with Cephalosporin use.   . Sulfonamide Derivatives Rash    Family History  Problem Relation Age of Onset  . Diabetes Mother   . Heart disease Mother   . Asthma Mother   . Kidney disease Mother   . Drug abuse Mother   . Hypertension Mother   . Mental illness Mother        ?sociopath/psychopath  . Early death Father        trauma  . Colon cancer Other        first cousin, paternal aunt and uncle  . Cancer Paternal  Grandmother        gastric  . Breast cancer Maternal Aunt        several paternal cousins  . Heart disease Maternal Grandmother   . Thyroid cancer Other        aunt  . Thyroid disease Sister        uncertain type-not cancer  . Heart disease Sister   . Heart disease Brother   . Cancer Sister        pancreatic    Prior to Admission medications   Medication Sig Start Date End Date Taking? Authorizing Provider  albuterol (PROAIR HFA) 108 (90 Base) MCG/ACT inhaler INHALE 2 PUFFS EVERY SIX HOURS  AS NEEDED FOR WHEEZING OR SHORTNESS OF BREATH Patient taking differently: Inhale 1 puff into the lungs every 6 (six) hours as needed for wheezing or shortness of breath.  03/10/18   Chipper Herb, MD  amLODipine (NORVASC) 10 MG tablet Take 1 tablet (10 mg total) by mouth daily. 03/10/18   Chipper Herb, MD  aspirin 325 MG EC tablet Take 1 tablet (325 mg total) by mouth daily. 08/14/18   Rehman, Mechele Dawley, MD  Carboxymethylcellulose Sodium (REFRESH LIQUIGEL) 1 % GEL Place 1 drop into both eyes 2 (two) times daily.     [provider]  CINNAMON PO Take 1 capsule by mouth daily.     [provider]  clopidogrel (PLAVIX) 75 MG tablet TAKE ONE TABLET BY MOUTH DAILY (PLEASE USE AUROBINDO BRAND, PT HD FOR ALLERGY TO REDDY) 08/14/18   Rehman, Mechele Dawley, MD  cyproheptadine (PERIACTIN) 4 MG tablet Take 2 mg by mouth daily as needed for allergies.    [provider]  escitalopram (LEXAPRO) 10 MG tablet TAKE 1 TABLET(10 MG) BY MOUTH DAILY Patient taking differently: Take 10 mg by mouth daily.  06/16/18   Chipper Herb, MD  hydrochlorothiazide (MICROZIDE) 12.5 MG capsule TAKE ONE CAPSULE BY MOUTH EVERY DAY Patient taking differently: Take 12.5 mg by mouth daily.  04/02/18   Chipper Herb, MD  JANUVIA 50 MG tablet TAKE ONE TABLET BY MOUTH DAILY Patient taking differently: Take 50 mg by mouth daily.  05/18/17   Chipper Herb, MD  metFORMIN (GLUCOPHAGE) 1000 MG tablet TAKE ONE TABLET BY  MOUTH EVERY MORNING WITH BREAKFAST and TAKE 1/2 TABLET EVERY EVENING WITH FOOD Patient taking differently: Take 1,000 mg by mouth daily with breakfast.  02/08/18   Chipper Herb, MD  metoCLOPramide (REGLAN) 10 MG tablet TAKE ONE TABLET BY MOUTH 1 TIME DAILY AS NEEDED Patient taking differently: Take 10 mg by mouth daily as needed for nausea or vomiting.  08/03/17   Hassell Done, Athaliah-Margaret, FNP  montelukast (SINGULAIR) 5 MG chewable tablet chew TWO tablets BY MOUTH AT BEDTIME Patient taking differently: Chew 10 mg by mouth at bedtime.  03/10/18   Chipper Herb, MD  pantoprazole (PROTONIX) 40 MG tablet TAKE 1 TABLET BY MOUTH EVERY DAY --FILL ONLY WHEN PATIENT REQUESTS 03/14/2016 KR-- Patient taking differently: Take 40 mg by mouth daily.  03/10/18   Chipper Herb, MD  Vitamin D, Ergocalciferol, (DRISDOL) 50000 units CAPS capsule TAKE 1 CAPSULE BY MOUTH EVERY SEVEN DAYS Patient taking differently: Take 50,000 Units by mouth every Wednesday.  02/24/18   Chipper Herb, MD  ramipril (ALTACE) 10 MG capsule Take 10 mg by mouth daily.    06/29/18  [provider]    Physical Exam: Vitals:   08/17/18 1127  BP: (!) 138/38  Pulse: 95  Resp: 18  Temp: 98.2 F (36.8 C)  TempSrc: Oral  SpO2: 96%  Weight: 85.3 kg  Height: 5\' 2"  (1.575 m)    Constitutional: NAD, calm, comfortable, obese Vitals:   08/17/18 1127  BP: (!) 138/38  Pulse: 95  Resp: 18  Temp: 98.2 F (36.8 C)  TempSrc: Oral  SpO2: 96%  Weight: 85.3 kg  Height: 5\' 2"  (1.575 m)   Eyes: lids and conjunctivae normal ENMT: Mucous membranes are moist.  Neck: normal, supple Respiratory: clear to auscultation bilaterally. Normal respiratory effort. No accessory muscle use.  Cardiovascular: Regular rate and rhythm, no murmurs. No extremity edema. Abdomen: no tenderness, no distention. Bowel sounds positive.  Musculoskeletal:  No joint deformity upper and lower extremities.   Skin: no rashes, lesions, ulcers.  Psychiatric:  Normal judgment and insight. Alert and oriented x 3. Normal mood.   Labs on Admission: I have personally reviewed following labs and imaging studies  CBC: Recent Labs  Lab 08/17/18 1203  WBC 8.9  NEUTROABS 6.5  HGB 7.2*  HCT 25.1*  MCV 69.7*  PLT 833   Basic Metabolic Panel: Recent Labs  Lab 08/17/18 1203  NA 129*  K 3.6  CL 93*  CO2 24  GLUCOSE 291*  BUN 11  CREATININE 0.81  CALCIUM 9.4   GFR: Estimated Creatinine Clearance: 60.8 mL/min (by C-G formula based on SCr of 0.81 mg/dL). Liver Function Tests: Recent Labs  Lab 08/17/18 1203  AST 66*  ALT 45*  ALKPHOS 82  BILITOT 0.7  PROT 6.8  ALBUMIN 3.6   Recent Labs  Lab 08/17/18 1203  LIPASE 30   No results for input(s): AMMONIA in the last 168 hours. Coagulation Profile: Recent Labs  Lab 08/17/18 1203  INR 1.19   Cardiac Enzymes: No results for input(s): CKTOTAL, CKMB, CKMBINDEX, TROPONINI in the last 168 hours. BNP (last 3 results) No results for input(s): PROBNP in the last 8760 hours. HbA1C: No results for input(s): HGBA1C in the last 72 hours. CBG: No results for input(s): GLUCAP in the last 168 hours. Lipid Profile: No results for input(s): CHOL, HDL, LDLCALC, TRIG, CHOLHDL, LDLDIRECT in the last 72 hours. Thyroid Function Tests: No results for input(s): TSH, T4TOTAL, FREET4, T3FREE, THYROIDAB in the last 72 hours. Anemia Panel: No results for input(s): VITAMINB12, FOLATE, FERRITIN, TIBC, IRON, RETICCTPCT in the last 72 hours. Urine analysis: No results found for: COLORURINE, APPEARANCEUR, LABSPEC, PHURINE, GLUCOSEU, HGBUR, BILIRUBINUR, KETONESUR, PROTEINUR, UROBILINOGEN, NITRITE, LEUKOCYTESUR  Radiological Exams on Admission: No results found.  Assessment/Plan Principal Problem:   Acute blood loss anemia Active Problems:   Obesity, unspecified   HTN (hypertension)   GERD (gastroesophageal reflux disease)   Diverticulosis of colon   COLONIC POLYPS, HX OF   Tobacco abuse   Type 2  diabetes mellitus with hyperlipidemia ([place])   Stroke ([place])    1. Acute blood loss anemia secondary to GI bleed.  2 unit PRBC transfusion as ordered.  Maintain on SCDs with PPI IV twice daily.  Consult to GI for further evaluation appreciated.  Continue to monitor with repeat CBC.  Hold aspirin and Plavix. 2. Lactic acidosis likely secondary to above.  Continue to follow with repeat labs in a.m.  1 L of normal saline to be given along with PRBCs which should help.  No leukocytosis noted and there are no signs of infection. 3. Hyponatremia.  Likely secondary to some hyperglycemia combined with use of HCTZ at home.  Will hold HCTZ for now and recheck in a.m. after normal saline has been administered.  We will check TSH, urine, and serum osmolarity along with urine sodium. 4. Hyperglycemia in the setting of type 2 diabetes.  Will hold home Januvia and metformin for now maintain on SSI.  Her hemoglobin A1c on 10/22 noted to be 8.1%. 5. Prior CVA.  Will have to hold aspirin and Plavix for now. 6. GERD.  Maintain on IV PPI twice daily as noted above. 7. Anxiety/depression.  Maintain on Lexapro. 8. Tobacco abuse.  Counseled on cessation.  Nicotine patch.   DVT prophylaxis: SCDs Code Status: Full Family Communication: Husband at bedside Disposition Plan: Admit for evaluation of GI bleed along with transfusion for severe  anemia Consults called: GI-Dr. Laural Golden Admission status: Inpatient, MedSurg   Pratik Darleen Crocker DO [facility] Pager [phone]  If 7PM-7AM, please contact night-coverage [email or web address] Password [email or web address]  08/17/2018, 1:30 PM

## 2018-08-17 NOTE — ED Notes (Signed)
CRITICAL VALUE ALERT  Critical Value:  Lactic Acid 4.25  Date & Time Notied: 08/17/18 1229  Provider Notified:  Dr. Vanita Panda   Orders Received/Actions taken: None yet

## 2018-08-17 NOTE — ED Provider Notes (Signed)
[facility] EMERGENCY DEPARTMENT Provider Note   CSN: [id number] Arrival date & time: 08/17/18  1105     History   Chief Complaint Chief Complaint  Patient presents with  . Rectal Bleeding    HPI Elizabeth [name] is a 77 y.o. female.  Patient is able to tolerate PO. Recent EGD, colonoscopy on 08/09/18 for dysphagia. Pt also has h/o iron deficiency anemia, CVA on ASA 325 mg qd.    Rectal Bleeding  Quality:  Bright red Amount:  Scant Duration:  3 days Timing:  Sporadic Chronicity:  New Context comment:  Recent colonoscopy and takes high dose aspirin Similar prior episodes: no   Relieved by:  Nothing Worsened by:  Nothing Ineffective treatments:  None tried Associated symptoms: light-headedness   Associated symptoms: no abdominal pain, no fever, no loss of consciousness and no vomiting   Risk factors: NSAID use     Past Medical History:  Diagnosis Date  . Absolute anemia 07/28/2018  . Active smoker   . Allergy   . Anxiety    Takes Xanax for anxiety  . Arthritis   . Asthma   . Cataract   . Chronic airway obstruction, not elsewhere classified   . Clotting disorder ([place])   . Depression   . Diarrhea   . Diverticulosis of colon (without mention of hemorrhage)   . Esophageal reflux   . Family history of malignant neoplasm of gastrointestinal tract   . Headache(784.0)    Irregular  . Neuromuscular disorder ([place])   . Obesity, unspecified   . Other and unspecified hyperlipidemia   . Personal history of colonic polyps   . Stricture and stenosis of esophagus   . Stroke ([place])   . Thyroid disease   . Type II or unspecified type diabetes mellitus without mention of complication, not stated as uncontrolled   . Unspecified asthma(493.90)   . Unspecified essential hypertension     Patient Active Problem List   Diagnosis Date Noted  . Iron deficiency anemia 08/11/2018  . Absolute anemia 07/28/2018  . Esophageal dysphagia 07/28/2018  . Stroke ([place])   . Elevated liver  function tests 10/22/2017  . Anxiety and depression 10/21/2017  . Abdominal aortic atherosclerosis ([place]) 10/21/2017  . Type 2 diabetes mellitus with hyperlipidemia ([place]) 09/06/2015  . Type 2 diabetes mellitus with peripheral neuropathy ([place]) 11/27/2014  . CVA (cerebral vascular accident) ([place]) 02/27/2012  . Cerebral aneurysm 02/27/2012  . 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 Gait instability 02/27/2012  . Vision disturbance following cerebrovascular accident 02/27/2012  . Dyspnea 11/14/2011  . Thyroid disease 10/12/2008  . Diabetes ([place]) 08/29/2008  . Hyperlipidemia 08/29/2008  . OBESITY, UNSPECIFIED 08/29/2008  . COPD (chronic obstructive pulmonary disease) ([place]) 08/29/2008  . WEIGHT LOSS 08/29/2008  . DIARRHEA 08/29/2008  . HTN (hypertension) 08/28/2008  . ASTHMA 08/28/2008  . ESOPHAGEAL STRICTURE 08/28/2008  . GERD 08/28/2008  . HIATAL HERNIA 08/28/2008  . DIVERTICULOSIS, COLON 08/28/2008  . COLONIC POLYPS, HX OF 08/28/2008    Past Surgical History:  Procedure Laterality Date  . APPENDECTOMY    . BACK SURGERY     Spinal surgery  . CARDIAC CATHETERIZATION  2006   LAD: 30%, RCA : 20%, normal EF, elevated LVEDP  . CARDIAC CATHETERIZATION    . CEREBRAL ANGIOGRAM  12/20/2015  . COLONOSCOPY N/A 08/09/2018   Procedure: COLONOSCOPY;  Surgeon: Rogene Houston, MD;  Location: [facility];  Service: Endoscopy;  Laterality: N/A;  1:55  . ESOPHAGEAL DILATION N/A 08/09/2018  Procedure: ESOPHAGEAL DILATION;  Surgeon: Rogene Houston, MD;  Location: [facility] END[facility];  Service: Endoscopy;  Laterality: N/A;  . ESOPHAGOGASTRODUODENOSCOPY N/A 08/09/2018   Procedure: ESOPHAGOGASTRODUODENOSCOPY (EGD);  Surgeon: Rogene Houston, MD;  Location: [facility];  Service: Endoscopy;  Laterality: N/A;  . IR ANGIO INTRA EXTRACRAN SEL COM CAROTID INNOMINATE BILAT MOD SED  01/14/2018  . IR ANGIO INTRA EXTRACRAN SEL COM CAROTID INNOMINATE BILAT MOD SED  05/11/2018  . IR ANGIO VERTEBRAL SEL SUBCLAVIAN  INNOMINATE UNI L MOD SED  01/14/2018  . IR ANGIO VERTEBRAL SEL SUBCLAVIAN INNOMINATE UNI L MOD SED  05/11/2018  . IR ANGIO VERTEBRAL SEL VERTEBRAL UNI R MOD SED  01/14/2018  . IR ANGIO VERTEBRAL SEL VERTEBRAL UNI R MOD SED  05/11/2018  . KNEE ARTHROSCOPY     left  . LUMBAR DISC SURGERY    . POLYPECTOMY  08/09/2018   Procedure: POLYPECTOMY;  Surgeon: Rogene Houston, MD;  Location: [facility];  Service: Endoscopy;;  ascending colon (CS x 2, HS x5) hepatic flexure (HSx1), recto-sigmoid (HSx1)  . TONSILLECTOMY    . TOTAL ABDOMINAL HYSTERECTOMY     menopause/bleeding     OB History   None      Home Medications    Prior to Admission medications   Medication Sig Start Date End Date Taking? Authorizing Provider  albuterol (PROAIR HFA) 108 (90 Base) MCG/ACT inhaler INHALE 2 PUFFS EVERY SIX HOURS AS NEEDED FOR WHEEZING OR SHORTNESS OF BREATH Patient taking differently: Inhale 1 puff into the lungs every 6 (six) hours as needed for wheezing or shortness of breath.  03/10/18   Chipper Herb, MD  amLODipine (NORVASC) 10 MG tablet Take 1 tablet (10 mg total) by mouth daily. 03/10/18   Chipper Herb, MD  aspirin 325 MG EC tablet Take 1 tablet (325 mg total) by mouth daily. 08/14/18   Rehman, Mechele Dawley, MD  Carboxymethylcellulose Sodium (REFRESH LIQUIGEL) 1 % GEL Place 1 drop into both eyes 2 (two) times daily.     [provider]  CINNAMON PO Take 1 capsule by mouth daily.     [provider]  clopidogrel (PLAVIX) 75 MG tablet TAKE ONE TABLET BY MOUTH DAILY (PLEASE USE AUROBINDO BRAND, PT HD FOR ALLERGY TO REDDY) 08/14/18   Rehman, Mechele Dawley, MD  cyproheptadine (PERIACTIN) 4 MG tablet Take 2 mg by mouth daily as needed for allergies.    [provider]  escitalopram (LEXAPRO) 10 MG tablet TAKE 1 TABLET(10 MG) BY MOUTH DAILY Patient taking differently: Take 10 mg by mouth daily.  06/16/18   Chipper Herb, MD  hydrochlorothiazide (MICROZIDE) 12.5 MG capsule TAKE ONE CAPSULE BY  MOUTH EVERY DAY Patient taking differently: Take 12.5 mg by mouth daily.  04/02/18   Chipper Herb, MD  JANUVIA 50 MG tablet TAKE ONE TABLET BY MOUTH DAILY Patient taking differently: Take 50 mg by mouth daily.  05/18/17   Chipper Herb, MD  metFORMIN (GLUCOPHAGE) 1000 MG tablet TAKE ONE TABLET BY MOUTH EVERY MORNING WITH BREAKFAST and TAKE 1/2 TABLET EVERY EVENING WITH FOOD Patient taking differently: Take 1,000 mg by mouth daily with breakfast.  02/08/18   Chipper Herb, MD  metoCLOPramide (REGLAN) 10 MG tablet TAKE ONE TABLET BY MOUTH 1 TIME DAILY AS NEEDED Patient taking differently: Take 10 mg by mouth daily as needed for nausea or vomiting.  08/03/17   Hassell Done, Maleiya-Margaret, FNP  montelukast (SINGULAIR) 5 MG chewable tablet chew TWO tablets  BY MOUTH AT BEDTIME Patient taking differently: Chew 10 mg by mouth at bedtime.  03/10/18   Chipper Herb, MD  pantoprazole (PROTONIX) 40 MG tablet TAKE 1 TABLET BY MOUTH EVERY DAY --FILL ONLY WHEN PATIENT REQUESTS 03/14/2016 KR-- Patient taking differently: Take 40 mg by mouth daily.  03/10/18   Chipper Herb, MD  Vitamin D, Ergocalciferol, (DRISDOL) 50000 units CAPS capsule TAKE 1 CAPSULE BY MOUTH EVERY SEVEN DAYS Patient taking differently: Take 50,000 Units by mouth every Wednesday.  02/24/18   Chipper Herb, MD  ramipril (ALTACE) 10 MG capsule Take 10 mg by mouth daily.    06/29/18  [provider]    Family History Family History  Problem Relation Age of Onset  . Diabetes Mother   . Heart disease Mother   . Asthma Mother   . Kidney disease Mother   . Drug abuse Mother   . Hypertension Mother   . Mental illness Mother        ?sociopath/psychopath  . Early death Father        trauma  . Colon cancer Other        first cousin, paternal aunt and uncle  . Cancer Paternal Grandmother        gastric  . Breast cancer Maternal Aunt        several paternal cousins  . Heart disease Maternal Grandmother   . Thyroid cancer Other         aunt  . Thyroid disease Sister        uncertain type-not cancer  . Heart disease Sister   . Heart disease Brother   . Cancer Sister        pancreatic    Social History Social History   Tobacco Use  . Smoking status: Current Every Day Smoker    Packs/day: 1.00    Years: 20.00    Pack years: 20.00    Types: Cigarettes    Start date: 01/15/1983  . Smokeless tobacco: Never Used  Substance Use Topics  . Alcohol use: No    Alcohol/week: 0.0 standard drinks  . Drug use: No     Allergies   Banana; Codeine; Stevia [stevioside]; Actos [pioglitazone]; Benicar [olmesartan medoxomil]; Chantix [varenicline]; Diovan [valsartan]; Lipitor [atorvastatin]; Spinach; Vicodin [hydrocodone-acetaminophen]; Zocor [simvastatin]; Ace inhibitors; Celebrex [celecoxib]; Latex; Penicillins; and Sulfonamide derivatives   Review of Systems Review of Systems  Constitutional: Negative for fever.  Gastrointestinal: Positive for hematochezia. Negative for abdominal pain and vomiting.  Neurological: Positive for light-headedness. Negative for loss of consciousness.     Physical Exam Updated Vital Signs BP (!) 138/38 (BP Location: Right Arm)   Pulse 95   Temp 98.2 F (36.8 C) (Oral)   Resp 18   Ht 5\' 2"  (1.575 m)   Wt 85.3 kg   SpO2 96%   BMI 34.39 kg/m   Physical Exam  Constitutional: She appears well-developed and well-nourished. No distress.  HENT:  Head: Normocephalic and atraumatic.  Eyes: Conjunctivae are normal. No scleral icterus.  Neck: No JVD present.  Cardiovascular: Normal rate and regular rhythm.  Pulmonary/Chest: Effort normal and breath sounds normal.  Abdominal: Soft. Bowel sounds are normal. She exhibits no distension. There is no tenderness.  Musculoskeletal: Normal range of motion. She exhibits no edema.  Neurological: She is alert.  Skin: Skin is warm and dry. No pallor.  Psychiatric: She has a normal mood and affect. Her behavior is normal.     ED Treatments /  Results  Labs (  all labs ordered are listed, but only abnormal results are displayed) Labs Reviewed  CBC WITH DIFFERENTIAL/PLATELET - Abnormal; Notable for the following components:      Result Value   RBC 3.60 (*)    Hemoglobin 7.2 (*)    HCT 25.1 (*)    MCV 69.7 (*)    MCH 20.0 (*)    MCHC 28.7 (*)    RDW 16.6 (*)    All other components within normal limits  COMPREHENSIVE METABOLIC PANEL - Abnormal; Notable for the following components:   Sodium 129 (*)    Chloride 93 (*)    Glucose, Bld 291 (*)    AST 66 (*)    ALT 45 (*)    All other components within normal limits  I-STAT CG4 LACTIC ACID, ED - Abnormal; Notable for the following components:   Lactic Acid, Venous 4.25 (*)    All other components within normal limits  LIPASE, BLOOD  PROTIME-INR  TYPE AND SCREEN  PREPARE RBC (CROSSMATCH)    EKG None  Radiology No results found.  Procedures Procedures (including critical care time)  Medications Ordered in ED Medications  sodium chloride 0.9 % bolus 1,000 mL (has no administration in time range)  0.9 %  sodium chloride infusion (Manually program via Guardrails IV Fluids) (has no administration in time range)  pantoprazole (PROTONIX) injection 40 mg (has no administration in time range)  oxymetazoline (AFRIN) 0.05 % nasal spray 1 spray (has no administration in time range)     Initial Impression / Assessment and Plan / ED Course  I have reviewed the triage vital signs and the nursing notes.  Pertinent labs & imaging results that were available during my care of the patient were reviewed by me and considered in my medical decision making (see chart for details).     Patient presents with GI bleeding in setting of recent colonoscopy and on high-dose aspirin.  Bleeding is minimal and she does notices intermittently on her adult incontinence pad.  Patient's been able tolerate p.o. she is symptomatically anemic.  Lactate elevated at 4.0 CBC shows Hg 7.2.  Baseline Hg  9-11.  Patient also has history of diverticulosis.  After reviewing recent colonoscopy, multiple polyps were retrieved but no gross cancerous mass was noted.  EGD was also unremarkable.  Rectal bleeding is most likely due to recent procedure coupled with high-dose aspirin.  Also possible that patient has flare of diverticulosis, but this is less likely. Spoke with Dr. Laural Golden, who recommends admission and transfusion - Plan to give 1 L NS to clear lactate - Transfuse 2 pRBC - consult admitting hospitalist  - diet clear liquids    Final Clinical Impressions(s) / ED Diagnoses   Final diagnoses:  Rectal bleeding  Symptomatic anemia    ED Discharge Orders    None       Bonnita Hollow, MD 08/17/18 1301    Carmin Muskrat, MD 08/17/18 2137

## 2018-08-17 NOTE — Consult Note (Signed)
Referring Provider: Heath Lark, DO Primary Care Physician:  Chipper Herb, MD Primary Gastroenterologist:  Dr. Laural Golden  Reason for Consultation:    Rectal bleeding.  HPI:   Patient is 77 year old Caucasian female with multiple medical problems including history of CVA who is on full dose aspirin and clopidogrel.  She was recently evaluated for iron deficiency anemia secondary to chronic GI bleed.  She underwent EGD and colonoscopy on 08/09/2018. EGD revealed small sliding hiatal hernia but no abnormalities to account for blood loss.  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  Patient was advised to go back on aspirin and clopidogrel 08/13/2018. She was fine until 4:00 this morning when she passed bright red blood per rectum.  Few hours later she passed more blood per rectum.  Patient called office and she was advised to report to emergency room immediately.  She noticed some cramping but she is now pain-free.  She did not experience nausea vomiting hematemesis weakness postural symptoms chest pain or shortness of breath. Evaluation in the emergency room revealed her hemoglobin to be 7.2 with hematocrit of 25.1 H&H on 07/28/2018 was 8.9 and 23.9. Patient was hospitalized.  Her aspirin and clopidogrel are on hold.  Last dose of these medications was yesterday.  She has gone few hours without a bowel movement. He is receiving a unit of PRBCs.     Past Medical History:  Diagnosis Date  . Absolute anemia 07/28/2018  . Active smoker   . Allergy   . Anxiety    Takes Xanax for anxiety  . Arthritis   . Asthma   . Cataract   . Chronic airway obstruction, not elsewhere classified   . Clotting disorder ([place])   . Depression   . Diarrhea   . Diverticulosis of colon (without mention of hemorrhage)   . Esophageal reflux   . Family history of malignant neoplasm of gastrointestinal tract   . Headache(784.0)    Irregular  . Neuromuscular disorder  ([place])   . Obesity, unspecified   . Other and unspecified hyperlipidemia   . Personal history of colonic polyps   . Stricture and stenosis of esophagus   . Stroke ([place])   . Thyroid disease   . Type II or unspecified type diabetes mellitus without mention of complication, not stated as uncontrolled   . Unspecified asthma(493.90)   . Unspecified essential hypertension     Past Surgical History:  Procedure Laterality Date  . APPENDECTOMY    . BACK SURGERY     Spinal surgery  . CARDIAC CATHETERIZATION  2006   LAD: 30%, RCA : 20%, normal EF, elevated LVEDP  . CARDIAC CATHETERIZATION    . CEREBRAL ANGIOGRAM  12/20/2015  . COLONOSCOPY N/A 08/09/2018   Procedure: COLONOSCOPY;  Surgeon: Rogene Houston, MD;  [facility];  Service: Endoscopy;  Laterality: N/A;  1:55  . ESOPHAGEAL DILATION N/A 08/09/2018   Procedure: ESOPHAGEAL DILATION;  Surgeon: Rogene Houston, MD;  Location: [facility];  Service: Endoscopy;  Laterality: N/A;  . ESOPHAGOGASTRODUODENOSCOPY N/A 08/09/2018   Procedure: ESOPHAGOGASTRODUODENOSCOPY (EGD);  Surgeon: Rogene Houston, MD;  [facility]: [facility];  Service: Endoscopy;  Laterality: N/A;  . IR ANGIO INTRA EXTRACRAN SEL COM CAROTID INNOMINATE BILAT MOD SED  01/14/2018  . IR ANGIO INTRA EXTRACRAN SEL COM CAROTID INNOMINATE BILAT MOD SED  05/11/2018  . IR ANGIO VERTEBRAL SEL SUBCLAVIAN INNOMINATE UNI L MOD SED  01/14/2018  . IR ANGIO VERTEBRAL  SEL SUBCLAVIAN INNOMINATE UNI L MOD SED  05/11/2018  . IR ANGIO VERTEBRAL SEL VERTEBRAL UNI R MOD SED  01/14/2018  . IR ANGIO VERTEBRAL SEL VERTEBRAL UNI R MOD SED  05/11/2018  . KNEE ARTHROSCOPY     left  . LUMBAR DISC SURGERY    . POLYPECTOMY  08/09/2018   Procedure: POLYPECTOMY;  Surgeon: Rogene Houston, MD;  Location: [facility];  Service: Endoscopy;;  ascending colon (CS x 2, HS x5) hepatic flexure (HSx1), recto-sigmoid (HSx1)  . TONSILLECTOMY    . TOTAL ABDOMINAL HYSTERECTOMY     menopause/bleeding    Prior to  Admission medications   Medication Sig Start Date End Date Taking? Authorizing Provider  albuterol (PROAIR HFA) 108 (90 Base) MCG/ACT inhaler INHALE 2 PUFFS EVERY SIX HOURS AS NEEDED FOR WHEEZING OR SHORTNESS OF BREATH Patient taking differently: Inhale 1 puff into the lungs every 6 (six) hours as needed for wheezing or shortness of breath.  03/10/18  Yes Chipper Herb, MD  amLODipine (NORVASC) 10 MG tablet Take 1 tablet (10 mg total) by mouth daily. 03/10/18  Yes Chipper Herb, MD  aspirin 325 MG EC tablet Take 1 tablet (325 mg total) by mouth daily. 08/14/18  Yes , Mechele Dawley, MD  Carboxymethylcellulose Sodium (REFRESH LIQUIGEL) 1 % GEL Place 1 drop into both eyes 2 (two) times daily.    Yes [provider]  CINNAMON PO Take 1 capsule by mouth daily as needed (for blood sugars).    Yes [provider]  clopidogrel (PLAVIX) 75 MG tablet TAKE ONE TABLET BY MOUTH DAILY (PLEASE USE AUROBINDO BRAND, PT HD FOR ALLERGY TO REDDY) 08/14/18  Yes , Mechele Dawley, MD  cyproheptadine (PERIACTIN) 4 MG tablet Take 2 mg by mouth daily as needed for allergies.   Yes [provider]  escitalopram (LEXAPRO) 10 MG tablet TAKE 1 TABLET(10 MG) BY MOUTH DAILY Patient taking differently: Take 10 mg by mouth daily.  06/16/18  Yes Chipper Herb, MD  hydrochlorothiazide (MICROZIDE) 12.5 MG capsule TAKE ONE CAPSULE BY MOUTH EVERY DAY Patient taking differently: Take 12.5 mg by mouth daily.  04/02/18  Yes Chipper Herb, MD  JANUVIA 50 MG tablet TAKE ONE TABLET BY MOUTH DAILY Patient taking differently: Take 50 mg by mouth every morning.  05/18/17  Yes Chipper Herb, MD  metFORMIN (GLUCOPHAGE) 1000 MG tablet TAKE ONE TABLET BY MOUTH EVERY MORNING WITH BREAKFAST and TAKE 1/2 TABLET EVERY EVENING WITH FOOD Patient taking differently: Take 1,000 mg by mouth daily with breakfast.  02/08/18  Yes Chipper Herb, MD  metoCLOPramide (REGLAN) 10 MG tablet TAKE ONE TABLET BY MOUTH 1 TIME DAILY AS  NEEDED Patient taking differently: Take 10 mg by mouth daily as needed for nausea or vomiting.  08/03/17  Yes Martin, Aylissa-Margaret, FNP  montelukast (SINGULAIR) 5 MG chewable tablet chew TWO tablets BY MOUTH AT BEDTIME Patient taking differently: Chew 5 mg by mouth every other day. At bedtime 03/10/18  Yes Chipper Herb, MD  pantoprazole (PROTONIX) 40 MG tablet TAKE 1 TABLET BY MOUTH EVERY DAY --FILL ONLY WHEN PATIENT REQUESTS 03/14/2016 KR-- Patient taking differently: Take 40 mg by mouth 2 (two) times daily.  03/10/18  Yes Chipper Herb, MD  Vitamin D, Ergocalciferol, (DRISDOL) 50000 units CAPS capsule TAKE 1 CAPSULE BY MOUTH EVERY SEVEN DAYS Patient taking differently: Take 50,000 Units by mouth every Wednesday.  02/24/18  Yes Chipper Herb, MD  ramipril (ALTACE) 10 MG capsule Take  10 mg by mouth daily.    06/29/18  [provider]    Current Facility-Administered Medications  Medication Dose Route Frequency Provider Last Rate Last Dose  . 0.9 %  sodium chloride infusion  250 mL Intravenous PRN Manuella Ghazi, Pratik D, DO      . [START ON 08/18/2018] amLODipine (NORVASC) tablet 10 mg  10 mg Oral Daily Manuella Ghazi, Pratik D, DO      . escitalopram (LEXAPRO) tablet 10 mg  10 mg Oral Daily Shah, Pratik D, DO      . insulin aspart (novoLOG) injection 0-5 Units  0-5 Units Subcutaneous QHS Shah, Pratik D, DO      . insulin aspart (novoLOG) injection 0-9 Units  0-9 Units Subcutaneous TID WC Shah, Pratik D, DO      . montelukast (SINGULAIR) tablet 10 mg  10 mg Oral QHS Shah, Pratik D, DO      . nicotine (NICODERM CQ - dosed in mg/24 hr) patch 7 mg  7 mg Transdermal Daily Manuella Ghazi, Pratik D, DO   7 mg at 08/17/18 1759  . ondansetron (ZOFRAN) tablet 4 mg  4 mg Oral Q6H PRN Manuella Ghazi, Pratik D, DO       Or  . ondansetron (ZOFRAN) injection 4 mg  4 mg Intravenous Q6H PRN Manuella Ghazi, Pratik D, DO      . pantoprazole (PROTONIX) injection 40 mg  40 mg Intravenous Q12H Shah, Pratik D, DO      . polyvinyl alcohol (LIQUIFILM  TEARS) 1.4 % ophthalmic solution 1 drop  1 drop Both Eyes BID Manuella Ghazi, Pratik D, DO      . sodium chloride flush (NS) 0.9 % injection 3 mL  3 mL Intravenous Q12H Shah, Pratik D, DO   3 mL at 08/17/18 1559  . sodium chloride flush (NS) 0.9 % injection 3 mL  3 mL Intravenous PRN Manuella Ghazi, Pratik D, DO        Allergies as of 08/17/2018 - Review Complete 08/17/2018  Allergen Reaction Noted  . Banana Anaphylaxis 04/08/2011  . Codeine Nausea And Vomiting and Other (See Comments) 08/28/2008  . Stevia [stevioside] Swelling and Other (See Comments) 04/02/2016  . Actos [pioglitazone] Swelling 12/20/2012  . Benicar [olmesartan medoxomil] Other (See Comments) 11/14/2011  . Chantix [varenicline] Other (See Comments) 12/20/2012  . Diovan [valsartan] Other (See Comments) 11/14/2011  . Lipitor [atorvastatin] Other (See Comments) 12/20/2012  . Spinach Other (See Comments) 02/27/2012  . Vicodin [hydrocodone-acetaminophen] Nausea And Vomiting 12/20/2012  . Zocor [simvastatin] Other (See Comments) 12/20/2012  . Ace inhibitors Cough 11/14/2011  . Celebrex [celecoxib] Rash 12/20/2012  . Latex Rash and Other (See Comments)   . Penicillins Rash and Other (See Comments)   . Sulfonamide derivatives Rash     Family History  Problem Relation Age of Onset  . Diabetes Mother   . Heart disease Mother   . Asthma Mother   . Kidney disease Mother   . Drug abuse Mother   . Hypertension Mother   . Mental illness Mother        ?sociopath/psychopath  . Early death Father        trauma  . Colon cancer Other        first cousin, paternal aunt and uncle  . Cancer Paternal Grandmother        gastric  . Breast cancer Maternal Aunt        several paternal cousins  . Heart disease Maternal Grandmother   . Thyroid cancer Other  aunt  . Thyroid disease Sister        uncertain type-not cancer  . Heart disease Sister   . Heart disease Brother   . Cancer Sister        pancreatic    Social History    Socioeconomic History  . Marital status: Married    Spouse name: [name]  . Number of children: 0  . Years of education: 16  . Highest education level: Bachelor's degree (e.g., BA, AB, BS)  Occupational History  . Occupation: Therapist, sports    Comment: [employer]  Social Needs  . Financial resource strain: Not hard at all  . Food insecurity:    Worry: Never true    Inability: Never true  . Transportation needs:    Medical: No    Non-medical: No  Tobacco Use  . Smoking status: Current Every Day Smoker    Packs/day: 1.00    Years: 20.00    Pack years: 20.00    Types: Cigarettes    Start date: 01/15/1983  . Smokeless tobacco: Never Used  Substance and Sexual Activity  . Alcohol use: No    Alcohol/week: 0.0 standard drinks  . Drug use: No  . Sexual activity: Not Currently  Lifestyle  . Physical activity:    Days per week: 0 days    Minutes per session: 0 min  . Stress: To some extent  Relationships  . Social connections:    Talks on phone: Not on file    Gets together: Not on file    Attends religious service: Not on file    Active member of club or organization: Not on file    Attends meetings of clubs or organizations: Not on file    Relationship status: Not on file  . Intimate partner violence:    Fear of current or ex partner: No    Emotionally abused: No    Physically abused: No    Forced sexual activity: No  Other Topics Concern  . Not on file  Social History Narrative   Married [name]   Limited activity due to back pain    Review of Systems: See HPI, otherwise normal ROS  Physical Exam: Temp:  [98.2 F (36.8 C)-99.3 F (37.4 C)] 98.6 F (37 C) (12/10 1646) Pulse Rate:  [80-95] 86 (12/10 1646) Resp:  [12-20] 18 (12/10 1646) BP: (104-148)/(38-79) 125/70 (12/10 1646) SpO2:  [96 %-100 %] 99 % (12/10 1646) Weight:  [85.3 kg-85.6 kg] 85.6 kg (12/10 1550) Last BM Date: 08/17/18   Patient is alert.  She appears pale.   He is not in any  distress. Conjunctiva is pale.  Sclerae nonicteric. Oropharyngeal mucosa is normal. Neck masses or thyromegaly noted. Cardiac exam with regular rhythm normal S1 and S2.  Grade 2/6 systolic ejection murmur best heard at left upper sternal border and aortic area. Auscultation of lungs reveal vesicular breath sounds bilaterally.  No rales or rhonchi present. Abdomen is distended with normal bowel sounds.  On palpation is soft and nontender with organomegaly or masses. No peripheral edema or clubbing noted.    Lab Results: Recent Labs    08/17/18 1203  WBC 8.9  HGB 7.2*  HCT 25.1*  PLT 393   BMET Recent Labs    08/17/18 1203  NA 129*  K 3.6  CL 93*  CO2 24  GLUCOSE 291*  BUN 11  CREATININE 0.81  CALCIUM 9.4   LFT Recent Labs    08/17/18 1203  PROT 6.8  ALBUMIN 3.6  AST 66*  ALT 45*  ALKPHOS 82  BILITOT 0.7   PT/INR Recent Labs    08/17/18 1203  LABPROT 15.0  INR 1.19    Assessment;  Patient is 77 year old Caucasian female who was recently evaluated for iron deficiency anemia secondary to chronic GI blood loss and underwent colonic polypectomy with removal of 8 tubular adenomas who resumed full dose aspirin and clopidogrel 3 days ago now presents with large volume painless hematochezia.  Bleeding would appear to be post polypectomy bleed until proven otherwise. Still have not determined as to the source of her iron deficiency anemia.  She is scheduled for small bowel given capsule study next week.  Recommendations;  Agree with holding aspirin and clopidogrel for now. Continue clear liquids. If patient shows signs and symptoms of continuous bleeding she would be offered therapeutic colonoscopy. Patient will be reevaluated tomorrow morning.   LOS: 0 days      08/17/2018, 7:18 PM

## 2018-08-18 DIAGNOSIS — Z6834 Body mass index (BMI) 34.0-34.9, adult: Secondary | ICD-10-CM

## 2018-08-18 DIAGNOSIS — I10 Essential (primary) hypertension: Secondary | ICD-10-CM

## 2018-08-18 DIAGNOSIS — E6609 Other obesity due to excess calories: Secondary | ICD-10-CM

## 2018-08-18 DIAGNOSIS — K625 Hemorrhage of anus and rectum: Secondary | ICD-10-CM

## 2018-08-18 DIAGNOSIS — I639 Cerebral infarction, unspecified: Secondary | ICD-10-CM

## 2018-08-18 DIAGNOSIS — Z720 Tobacco use: Secondary | ICD-10-CM

## 2018-08-18 DIAGNOSIS — K219 Gastro-esophageal reflux disease without esophagitis: Secondary | ICD-10-CM

## 2018-08-18 DIAGNOSIS — E1169 Type 2 diabetes mellitus with other specified complication: Secondary | ICD-10-CM

## 2018-08-18 DIAGNOSIS — Z86010 Personal history of colonic polyps: Secondary | ICD-10-CM

## 2018-08-18 DIAGNOSIS — E785 Hyperlipidemia, unspecified: Secondary | ICD-10-CM

## 2018-08-18 LAB — CBC
HCT: 32.1 % — ABNORMAL LOW (ref 36.0–46.0)
Hemoglobin: 9.7 g/dL — ABNORMAL LOW (ref 12.0–15.0)
MCH: 21.9 pg — ABNORMAL LOW (ref 26.0–34.0)
MCHC: 30.2 g/dL (ref 30.0–36.0)
MCV: 72.5 fL — ABNORMAL LOW (ref 80.0–100.0)
Platelets: 344 10*3/uL (ref 150–400)
RBC: 4.43 MIL/uL (ref 3.87–5.11)
RDW: 17.7 % — ABNORMAL HIGH (ref 11.5–15.5)
WBC: 6.3 10*3/uL (ref 4.0–10.5)
nRBC: 0 % (ref 0.0–0.2)

## 2018-08-18 LAB — TYPE AND SCREEN
ABO/RH(D): O NEG
Antibody Screen: NEGATIVE
Unit division: 0
Unit division: 0

## 2018-08-18 LAB — BPAM RBC
Blood Product Expiration Date: 201912232359
Blood Product Expiration Date: 201912242359
ISSUE DATE / TIME: 201912101622
ISSUE DATE / TIME: 201912102032
Unit Type and Rh: 9500
Unit Type and Rh: 9500

## 2018-08-18 LAB — COMPREHENSIVE METABOLIC PANEL
ALT: 41 U/L (ref 0–44)
AST: 52 U/L — ABNORMAL HIGH (ref 15–41)
Albumin: 3.6 g/dL (ref 3.5–5.0)
Alkaline Phosphatase: 76 U/L (ref 38–126)
Anion gap: 9 (ref 5–15)
BUN: 6 mg/dL — ABNORMAL LOW (ref 8–23)
CO2: 28 mmol/L (ref 22–32)
Calcium: 9.4 mg/dL (ref 8.9–10.3)
Chloride: 101 mmol/L (ref 98–111)
Creatinine, Ser: 0.66 mg/dL (ref 0.44–1.00)
GFR calc Af Amer: >60 mL/min (ref >60)
GFR calc non Af Amer: >60 mL/min (ref >60)
Glucose, Bld: 179 mg/dL — ABNORMAL HIGH (ref 70–99)
Potassium: 3.3 mmol/L — ABNORMAL LOW (ref 3.5–5.1)
Sodium: 138 mmol/L (ref 135–145)
Total Bilirubin: 1.6 mg/dL — ABNORMAL HIGH (ref 0.3–1.2)
Total Protein: 6.7 g/dL (ref 6.5–8.1)

## 2018-08-18 LAB — LACTIC ACID, PLASMA: Lactic Acid, Venous: 1.8 mmol/L (ref 0.5–1.9)

## 2018-08-18 LAB — MAGNESIUM: Magnesium: 1.8 mg/dL (ref 1.7–2.4)

## 2018-08-18 LAB — GLUCOSE, CAPILLARY
Glucose-Capillary: 187 mg/dL — ABNORMAL HIGH (ref 70–99)
Glucose-Capillary: 150 mg/dL — ABNORMAL HIGH (ref 70–99)

## 2018-08-18 LAB — HEMOGLOBIN AND HEMATOCRIT, BLOOD
HCT: 34.8 % — ABNORMAL LOW (ref 36.0–46.0)
Hemoglobin: 10.5 g/dL — ABNORMAL LOW (ref 12.0–15.0)

## 2018-08-18 MED ORDER — POTASSIUM CHLORIDE CRYS ER 20 MEQ PO TBCR
40.0000 meq | EXTENDED_RELEASE_TABLET | ORAL | Status: DC
  Administered 2018-08-18 (×2): 40 meq via ORAL
  Filled 2018-08-18 (×2): qty 2

## 2018-08-18 MED ORDER — PANTOPRAZOLE SODIUM 40 MG PO TBEC
40.0000 mg | DELAYED_RELEASE_TABLET | Freq: Two times a day (BID) | ORAL | Status: DC
  Administered 2018-08-18: 40 mg via ORAL
  Filled 2018-08-18: qty 1

## 2018-08-18 MED ORDER — CLOPIDOGREL BISULFATE 75 MG PO TABS: ORAL_TABLET | ORAL | 1 refills | Status: DC

## 2018-08-18 MED ORDER — ASPIRIN 325 MG PO TBEC: 325.0000 mg | DELAYED_RELEASE_TABLET | Freq: Every day | ORAL | Status: DC

## 2018-08-18 MED ORDER — PANTOPRAZOLE SODIUM 40 MG PO TBEC: 40.0000 mg | DELAYED_RELEASE_TABLET | Freq: Two times a day (BID) | ORAL | 1 refills | Status: DC

## 2018-08-18 NOTE — Progress Notes (Signed)
Patient has not had any more bowel movement since she was seen earlier today. Hemoglobin is up to 10.5. Patient stable for discharge. He will return for small bowel given capsule study next week. Agree with ensued resume aspirin and clopidogrel on 08/23/2018.

## 2018-08-18 NOTE — Discharge Summary (Signed)
Physician Discharge Summary  Elizabeth [name] ZDG:[id number] DOB: Mar 28, 1941 DOA: 08/17/2018  PCP: Chipper Herb, MD  Admit date: 08/17/2018 Discharge date: 08/18/2018  Time spent: 35 minutes  Recommendations for Outpatient Follow-up:  Repeat basic metabolic panel to follow electrolytes and renal function Repeat CBC to follow hemoglobin trend.  Discharge Diagnoses:  Principal Problem:   Acute blood loss anemia Active Problems:   Obesity, unspecified   HTN (hypertension)   GERD (gastroesophageal reflux disease)   Diverticulosis of colon   COLONIC POLYPS, HX OF   Tobacco abuse   Type 2 diabetes mellitus with hyperlipidemia (HCC)   Stroke [facility])   Rectal bleeding   Discharge Condition: Stable and improved.  Patient discharged home with instruction to follow-up with PCP and GI as an outpatient.  Diet recommendation: Heart healthy and modified carbohydrates  Filed Weights   08/17/18 1127 08/17/18 1550  Weight: 85.3 kg 85.6 kg    History of present illness:  As per H&P written by Dr. Manuella Ghazi on 08/17/2018 77 y.o. female with medical history significant for obesity, tobacco abuse, prior CVA in 2013, hypertension, type 2 diabetes, GERD, prior esophageal dilation, diverticulosis, and colon polyps who presented to the ED with some rectal bleeding/bloody bowel movements that began this morning.  She is status post upper and lower endoscopy on 12/2 for esophageal dilation as well as polypectomy and has had no other symptoms until just this morning.  She had resumed her full dose aspirin on 12/7 as advised.  She denies any abdominal pain, nausea, vomiting, fever, or chills.  No lightheadedness or dizziness noted.  She has a Givens capsule study ordered for 12/16.   ED Course: Vital signs are noted to be stable and hemoglobin is 7.2 with baseline between 9-10.  Her lactic acid is 4.25.  Sodium 129 and glucose 291.  Her INR is 1.19.  Case was discussed with Dr. Laural Golden of GI who advises admission  for transfusion with further evaluation.  1 L fluid as well as 2 units of PRBCs have been ordered and IV Protonix has been administered.  Patient is on clear liquid diet.  Hospital Course:  1-acute blood loss anemia secondary to GI bleed: This appears to be acute on chronic as she was already having trouble with iron deficiency anemia and in the process of GI work-up. -Appears to be secondary to polypectomy bleed (patient with recent 6-8 polyps removed; presented with painless hematochezia). -Continue holding Plavix and aspirin for 5 days -Maintain adequate hydration -At discharge no signs of ongoing bleeding -Hemoglobin 10.7 after 2 units of PRBCs during this hospitalization -Repeat CBC to follow-up hemoglobin trend -Outpatient follow-up with GI service for capsule endoscopy (previously scheduled as part of work-up for iron deficiency anemia). -No acute findings or spleen her iron deficiency anemia on recent upper and lower endoscopy procedures.  2-lactic acidosis -Likely secondary to dehydration and problem 1 -After IV fluids and transfusion lactic acid is back to normal -Patient advised to maintain adequate hydration.  3-hyponatremia -Likely secondary to hyperglycemia and combined use of HCTZ -Diuretics were held initially and the patient received fluid resuscitation -Electrolytes are back to normal -Patient advised to maintain adequate hydration -Continue good control of her blood sugar resume diuretics at discharge.  4-type 2 diabetes mellitus with hyperglycemia: No use of insulin as an outpatient -Resume Januvia and metformin -Most recent hemoglobin A1c in October 2019 was 8.1  5-essential hypertension -Resume home antihypertensive regimen -Follow heart healthy diet.  6-history of prior CVA -Continue risk  factor modification -For now we will hold aspirin and Plavix as recommended by GI for the next 5 days. -No new focal neurologic deficit or acute complaints  reported.  7-GERD -Resume PPI twice a day  8-anxiety/depression -Continue Lexapro  9-tobacco abuse -I have discussed tobacco cessation with the patient.  I have counseled the patient regarding the negative impacts of continued tobacco use including but not limited to lung cancer, COPD, and cardiovascular disease.  I have discussed alternatives to tobacco and modalities that may help facilitate tobacco cessation including but not limited to biofeedback, hypnosis, and medications.  Total time spent with tobacco counseling was 4 minutes -nicotine patch use while inpatient and encourage to continue use as needed. (Nicotine patches are over-the-counter).    Procedures:  None  Consultations:  Gastroenterology service.  Discharge Exam: Vitals:   08/18/18 0540 08/18/18 1359  BP: (!) 162/70 (!) 143/57  Pulse: 80 80  Resp: 19 20  Temp: 98.4 F (36.9 C) 98.4 F (36.9 C)  SpO2: 99% 98%    General: Afebrile, no chest pain, no shortness of breath, no nausea, no vomiting.  Patient has tolerated regular consistency on her diet and expressed no further blood in her stools. Cardiovascular: S1 and S2, no rubs, no gallops, no JVD. Respiratory: Good air movement bilaterally, no wheezing, no crackles. Abdomen: Soft, nontender, nondistended, positive bowel sounds Extremities: No edema, no cyanosis, no clubbing.  Discharge Instructions   Discharge Instructions    Diet - low sodium heart healthy   Complete by:  As directed    Discharge instructions   Complete by:  As directed    Hold the use of aspirin and Plavix for 5 days Maintain adequate hydration Avoid the use of NSAIDs over-the-counter Follow-up with gastroenterology as previously scheduled for outpatient capsule endoscopy Arrange follow-up with PCP in 2 weeks. Follow heart healthy diet.     Allergies as of 08/18/2018      Reactions   Banana Anaphylaxis   Codeine Nausea And Vomiting, Other (See Comments)   PROJECTILE VOMITING    Stevia [stevioside] Swelling, Other (See Comments)   Face, tongue, and eye swelling    Actos [pioglitazone] Swelling   Benicar [olmesartan Medoxomil] Other (See Comments)   Feel bad   Chantix [varenicline] Other (See Comments)   "Worked opposite."   Diovan [valsartan] Other (See Comments)   Feel bad   Lipitor [atorvastatin] Other (See Comments)   REACTION:  Joint pain   Spinach Other (See Comments)   Stomach problems   Vicodin [hydrocodone-acetaminophen] Nausea And Vomiting   Zocor [simvastatin] Other (See Comments)   REACTION:  "made liver function incorrectly"   Ace Inhibitors Cough   Celebrex [celecoxib] Rash   Latex Rash, Other (See Comments)   REACTION: red rash   Penicillins Rash, Other (See Comments)   Has patient had a PCN reaction causing immediate rash, facial/tongue/throat swelling, SOB or lightheadedness with hypotension: No Has patient had a PCN reaction causing severe rash involving mucus membranes or skin necrosis: No Has patient had a PCN reaction that required hospitalization: No- MD office Has patient had a PCN reaction occurring within the last 10 years: No If all of the above answers are "NO", then may proceed with Cephalosporin use.   Sulfonamide Derivatives Rash      Medication List    TAKE these medications   albuterol 108 (90 Base) MCG/ACT inhaler Commonly known as:  PROVENTIL HFA;VENTOLIN HFA INHALE 2 PUFFS EVERY SIX HOURS AS NEEDED FOR WHEEZING OR SHORTNESS OF  BREATH What changed:    how much to take  how to take this  when to take this  reasons to take this  additional instructions   amLODipine 10 MG tablet Commonly known as:  NORVASC Take 1 tablet (10 mg total) by mouth daily.   aspirin 325 MG EC tablet Take 1 tablet (325 mg total) by mouth daily. Hold for 5 days. Start taking on:  08/23/2018 What changed:    additional instructions  These instructions start on 08/23/2018. If you are unsure what to do until then, ask your doctor  or other care provider.   CINNAMON PO Take 1 capsule by mouth daily as needed (for blood sugars).   clopidogrel 75 MG tablet Commonly known as:  PLAVIX TAKE ONE TABLET BY MOUTH DAILY (PLEASE USE AUROBINDO BRAND, PT HD FOR ALLERGY TO REDDY). Hold for 5 days Start taking on:  08/23/2018 What changed:    additional instructions  These instructions start on 08/23/2018. If you are unsure what to do until then, ask your doctor or other care provider.   cyproheptadine 4 MG tablet Commonly known as:  PERIACTIN Take 2 mg by mouth daily as needed for allergies.   escitalopram 10 MG tablet Commonly known as:  LEXAPRO TAKE 1 TABLET(10 MG) BY MOUTH DAILY What changed:  See the new instructions.   hydrochlorothiazide 12.5 MG capsule Commonly known as:  MICROZIDE TAKE ONE CAPSULE BY MOUTH EVERY DAY   JANUVIA 50 MG tablet Generic drug:  sitaGLIPtin TAKE ONE TABLET BY MOUTH DAILY What changed:    how much to take  when to take this   metFORMIN 1000 MG tablet Commonly known as:  GLUCOPHAGE TAKE ONE TABLET BY MOUTH EVERY MORNING WITH BREAKFAST and TAKE 1/2 TABLET EVERY EVENING WITH FOOD What changed:    how much to take  how to take this  when to take this  additional instructions   metoCLOPramide 10 MG tablet Commonly known as:  REGLAN TAKE ONE TABLET BY MOUTH 1 TIME DAILY AS NEEDED What changed:    how much to take  how to take this  when to take this  reasons to take this  additional instructions   montelukast 5 MG chewable tablet Commonly known as:  SINGULAIR chew TWO tablets BY MOUTH AT BEDTIME What changed:    how much to take  how to take this  when to take this  additional instructions   pantoprazole 40 MG tablet Commonly known as:  PROTONIX Take 1 tablet (40 mg total) by mouth 2 (two) times daily.   REFRESH LIQUIGEL 1 % Gel Generic drug:  Carboxymethylcellulose Sodium Place 1 drop into both eyes 2 (two) times daily.   Vitamin D  (Ergocalciferol) 1.25 MG (50000 UT) Caps capsule Commonly known as:  DRISDOL TAKE 1 CAPSULE BY MOUTH EVERY SEVEN DAYS What changed:    how much to take  how to take this  when to take this  additional instructions      Allergies  Allergen Reactions  . Banana Anaphylaxis  . Codeine Nausea And Vomiting and Other (See Comments)    PROJECTILE VOMITING  . Stevia [Stevioside] Swelling and Other (See Comments)    Face, tongue, and eye swelling   . Actos [Pioglitazone] Swelling  . Benicar [Olmesartan Medoxomil] Other (See Comments)    Feel bad  . Chantix [Varenicline] Other (See Comments)    "Worked opposite."  . Diovan [Valsartan] Other (See Comments)    Feel bad  . Lipitor [Atorvastatin]  Other (See Comments)    REACTION:  Joint pain  . Spinach Other (See Comments)    Stomach problems  . Vicodin [Hydrocodone-Acetaminophen] Nausea And Vomiting  . Zocor [Simvastatin] Other (See Comments)    REACTION:  "made liver function incorrectly"  . Ace Inhibitors Cough  . Celebrex [Celecoxib] Rash  . Latex Rash and Other (See Comments)    REACTION: red rash  . Penicillins Rash and Other (See Comments)    Has patient had a PCN reaction causing immediate rash, facial/tongue/throat swelling, SOB or lightheadedness with hypotension: No Has patient had a PCN reaction causing severe rash involving mucus membranes or skin necrosis: No Has patient had a PCN reaction that required hospitalization: No- MD office Has patient had a PCN reaction occurring within the last 10 years: No If all of the above answers are "NO", then may proceed with Cephalosporin use.   . Sulfonamide Derivatives Rash   Follow-up Information    Chipper Herb, MD. Schedule an appointment as soon as possible for a visit in 2 week(s).   Specialty:  Family Medicine Contact information: [place] [address] [phone]           The results of significant diagnostics from this hospitalization  (including imaging, microbiology, ancillary and laboratory) are listed below for reference.     Labs: Basic Metabolic Panel: Recent Labs  Lab 08/17/18 1203 08/18/18 0534  NA 129* 138  K 3.6 3.3*  CL 93* 101  CO2 24 28  GLUCOSE 291* 179*  BUN 11 6*  CREATININE 0.81 0.66  CALCIUM 9.4 9.4  MG  --  1.8   Liver Function Tests: Recent Labs  Lab 08/17/18 1203 08/18/18 0534  AST 66* 52*  ALT 45* 41  ALKPHOS 82 76  BILITOT 0.7 1.6*  PROT 6.8 6.7  ALBUMIN 3.6 3.6   Recent Labs  Lab 08/17/18 1203  LIPASE 30   CBC: Recent Labs  Lab 08/17/18 1203 08/18/18 0534 08/18/18 1446  WBC 8.9 6.3  --   NEUTROABS 6.5  --   --   HGB 7.2* 9.7* 10.5*  HCT 25.1* 32.1* 34.8*  MCV 69.7* 72.5*  --   PLT 393 344  --    CBG: Recent Labs  Lab 08/17/18 1616 08/17/18 2124 08/18/18 0800 08/18/18 1147  GLUCAP 135* 154* 187* 150*    Signed:  Barton Dubois MD.  [facility] 08/18/2018, 3:47 PM

## 2018-08-18 NOTE — Progress Notes (Signed)
Discharged home with instructions given on medications and follow up visits,patient verbalized understanding. Prescriptions sent to Pharmacy of choice documented on AVS. Accompanied by staff to an awaiting vehicle.

## 2018-08-18 NOTE — Progress Notes (Signed)
  Subjective:  Patient has no complaints.  She states she had a bowel movement around 6 AM when she passed small amount of old blood.  She is hungry.  She denies abdominal pain nausea or vomiting.  She does not feel weak or lightheaded.  Objective: Blood pressure (!) 162/70, pulse 80, temperature 98.4 F (36.9 C), temperature source Oral, resp. rate 19, height '5\' 2"'$  (1.575 m), weight 85.6 kg, SpO2 99 %. Patient is alert and appears to be comfortable sitting in a chair. Cardiac exam with regular rhythm normal S1 and S2.  She has grade 2/6 systolic ejection murmur best heard at left upper sternal border and aortic area. Clear to auscultation. Abdomen is full with normal bowel sounds.  On palpation is soft and nontender.  Labs/studies Results:  CBC Latest Ref Rng & Units 08/18/2018 08/17/2018 07/28/2018  WBC 4.0 - 10.5 K/uL 6.3 8.9 8.2  Hemoglobin 12.0 - 15.0 g/dL 9.7(L) 7.2(L) 8.9(L)  Hematocrit 36.0 - 46.0 % 32.1(L) 25.1(L) 29.3(L)  Platelets 150 - 400 K/uL 344 393 398    CMP Latest Ref Rng & Units 08/18/2018 08/17/2018 06/29/2018  Glucose 70 - 99 mg/dL 179(H) 291(H) 179(H)  BUN 8 - 23 mg/dL 6(L) 11 9  Creatinine 0.44 - 1.00 mg/dL 0.66 0.81 0.73  Sodium 135 - 145 mmol/L 138 129(L) 135  Potassium 3.5 - 5.1 mmol/L 3.3(L) 3.6 4.1  Chloride 98 - 111 mmol/L 101 93(L) 93(L)  CO2 22 - 32 mmol/L '28 24 25  '$ Calcium 8.9 - 10.3 mg/dL 9.4 9.4 10.4(H)  Total Protein 6.5 - 8.1 g/dL 6.7 6.8 7.3  Total Bilirubin 0.3 - 1.2 mg/dL 1.6(H) 0.7 0.5  Alkaline Phos 38 - 126 U/L 76 82 105  AST 15 - 41 U/L 52(H) 66(H) 58(H)  ALT 0 - 44 U/L 41 45(H) 47(H)    Hepatic Function Latest Ref Rng & Units 08/18/2018 08/17/2018 06/29/2018  Total Protein 6.5 - 8.1 g/dL 6.7 6.8 7.3  Albumin 3.5 - 5.0 g/dL 3.6 3.6 4.7  AST 15 - 41 U/L 52(H) 66(H) 58(H)  ALT 0 - 44 U/L 41 45(H) 47(H)  Alk Phosphatase 38 - 126 U/L 76 82 105  Total Bilirubin 0.3 - 1.2 mg/dL 1.6(H) 0.7 0.5  Bilirubin, Direct 0.00 - 0.40 mg/dL - - 0.20     No results found for: CRP    Assessment:  #1.  Post polypectomy lower GI bleed.  She has received 1 unit of PRBCs.  It appears patient has stopped bleeding.  No plans for further intervention at this time unless bleeding recurs.  #2.  Iron deficiency anemia.  Acute on chronic.  She has received 1 unit of PRBCs.  She underwent EGD and colonoscopy last week and no bleeding lesion was identified.  She is scheduled for small bowel given capsule study next week.  #3.  Lactic acidosis.  Serum lactic acid is normal.  Probably related to GI bleeding hypotension.  She is afebrile and does not have leukocytosis.  #4.  Mildly elevated transaminases most likely due to fatty liver.  #5.  Diabetes mellitus.  Recommendations:  Advance diet to modified carb diet. We will check H&H later this afternoon. To hold aspirin and clopidogrel for 5 days.

## 2018-08-20 ENCOUNTER — Telehealth: Payer: Self-pay | Admitting: *Deleted

## 2018-08-20 ENCOUNTER — Other Ambulatory Visit: Payer: Self-pay

## 2018-08-20 NOTE — Telephone Encounter (Signed)
Call Completed and Appointment Scheduled for hospital follow up with Dr Lajuana Ripple on 09/06/18. Unable to document as transitional care management because the appointment was scheduled outside of the 14 day window.    DISCHARGE INFORMATION Date of Discharge:08/19/18  Recommendations for Outpatient Follow-up:  Repeat basic metabolic panel to follow electrolytes and renal function. Repeat CBC to follow hemoglobin trend.  Discharge Facility: [facility]  Principal Discharge Diagnosis: GI bleed  Patient and/or caregiver is knowledgeable of his/her condition(s) and treatment: Yes  MEDICATION RECONCILIATION Current medication list reviewed with patient:Yes  Outpatient Encounter Medications as of 08/20/2018  Medication Sig  . albuterol (PROAIR HFA) 108 (90 Base) MCG/ACT inhaler INHALE 2 PUFFS EVERY SIX HOURS AS NEEDED FOR WHEEZING OR SHORTNESS OF BREATH (Patient taking differently: Inhale 1 puff into the lungs every 6 (six) hours as needed for wheezing or shortness of breath. )  . amLODipine (NORVASC) 10 MG tablet Take 1 tablet (10 mg total) by mouth daily.  Derrill Memo ON 08/23/2018] aspirin 325 MG EC tablet Take 1 tablet (325 mg total) by mouth daily. Hold for 5 days.  . Carboxymethylcellulose Sodium (REFRESH LIQUIGEL) 1 % GEL Place 1 drop into both eyes 2 (two) times daily.   Marland Kitchen CINNAMON PO Take 1 capsule by mouth daily as needed (for blood sugars).   Derrill Memo ON 08/23/2018] clopidogrel (PLAVIX) 75 MG tablet TAKE ONE TABLET BY MOUTH DAILY (PLEASE USE AUROBINDO BRAND, PT HD FOR ALLERGY TO REDDY). Hold for 5 days  . cyproheptadine (PERIACTIN) 4 MG tablet Take 2 mg by mouth daily as needed for allergies.  Marland Kitchen escitalopram (LEXAPRO) 10 MG tablet TAKE 1 TABLET(10 MG) BY MOUTH DAILY (Patient taking differently: Take 10 mg by mouth daily. )  . hydrochlorothiazide (MICROZIDE) 12.5 MG capsule TAKE ONE CAPSULE BY MOUTH EVERY DAY (Patient taking differently: Take 12.5 mg by mouth daily. )  . JANUVIA 50 MG  tablet TAKE ONE TABLET BY MOUTH DAILY (Patient taking differently: Take 50 mg by mouth every morning. )  . metFORMIN (GLUCOPHAGE) 1000 MG tablet TAKE ONE TABLET BY MOUTH EVERY MORNING WITH BREAKFAST and TAKE 1/2 TABLET EVERY EVENING WITH FOOD (Patient taking differently: Take 1,000 mg by mouth daily with breakfast. )  . metoCLOPramide (REGLAN) 10 MG tablet TAKE ONE TABLET BY MOUTH 1 TIME DAILY AS NEEDED (Patient taking differently: Take 10 mg by mouth daily as needed for nausea or vomiting. )  . montelukast (SINGULAIR) 5 MG chewable tablet chew TWO tablets BY MOUTH AT BEDTIME (Patient taking differently: Chew 5 mg by mouth every other day. At bedtime)  . pantoprazole (PROTONIX) 40 MG tablet Take 1 tablet (40 mg total) by mouth 2 (two) times daily.  . Vitamin D, Ergocalciferol, (DRISDOL) 50000 units CAPS capsule TAKE 1 CAPSULE BY MOUTH EVERY SEVEN DAYS (Patient taking differently: Take 50,000 Units by mouth every Wednesday. )  . [DISCONTINUED] ramipril (ALTACE) 10 MG capsule Take 10 mg by mouth daily.     No facility-administered encounter medications on file as of 08/20/2018.     Discharge Medications reviewed and reconciled with current medications.yes  Patient is able to obtain needed medications:Yes  ACTIVITIES OF DAILY LIVING  Is the patient able to perform his/her own ADLs: Yes.    Patient is receiving home health services: No.  PATIENT EDUCATION Questions/Concerns Discussed: Continue to hold ASA as instructed until after endoscopy on 08/23/18. Patient will keep f/u with Dr Laural Golden and seek care sooner if needed.   Chong Sicilian, RN-BC, BSN Nurse Case Manager  [place] Ph: [phone]

## 2018-08-20 NOTE — Patient Outreach (Signed)
[facility]) Care Management  [place]   08/20/2018  Elizabeth [name] July 17, 1941 [id number]  Reason for referral: 30 day post discharge medication review  Current insurance:HTA  PMHx:  Hypertension, CVA in 2013, COPD, esophageal stricture, type 2 diabetes mellitus, rectal bleeding, anemia, smoker and hyperlipidemia.  HPI:  Elizabeth [name] reports that she is feeling better now that her hemoglobin is up to 10 g/dL after receiving 2 units of blood while hospitalized.  Patient had a polypectomy on 08/09/18.   She states that she has not noticed any rectal bleeding since she has been home.  Patient reports that she checks her CBGs every morning with usual values around ~130 mg/dL.  She states that she is able to afford her medications.   She states she is holding aspirin and clopidogrel until after her small bowel capsule study on Monday.  Objective: Lab Results  Component Value Date   CREATININE 0.66 08/18/2018   CREATININE 0.81 08/17/2018   CREATININE 0.73 06/29/2018    Lab Results  Component Value Date   HGBA1C 7.5 (H) 01/21/2017    Lipid Panel     Component Value Date/Time   CHOL 253 (H) 06/29/2018 1206   CHOL 242 (H) 08/09/2013 0828   TRIG 114 06/29/2018 1206   TRIG 180 (H) 09/06/2015 0947   TRIG 92 08/09/2013 0828   HDL 40 06/29/2018 1206   HDL 43 09/06/2015 0947   HDL 53 08/09/2013 0828   CHOLHDL 6.3 (H) 06/29/2018 1206   CHOLHDL 7.0 01/21/2017 1015   VLDL 27 01/21/2017 1015   LDLCALC 190 (H) 06/29/2018 1206   LDLCALC 230 (H) 02/24/2014 0837   LDLCALC 171 (H) 08/09/2013 0828    BP Readings from Last 3 Encounters:  08/18/18 (!) 143/57  08/09/18 (!) 124/59  07/28/18 (!) 164/60    Allergies  Allergen Reactions  . Banana Anaphylaxis  . Codeine Nausea And Vomiting and Other (See Comments)    PROJECTILE VOMITING  . Stevia [Stevioside] Swelling and Other (See Comments)    Face, tongue, and eye swelling   . Actos [Pioglitazone] Swelling  .  Benicar [Olmesartan Medoxomil] Other (See Comments)    Feel bad  . Chantix [Varenicline] Other (See Comments)    "Worked opposite."  . Diovan [Valsartan] Other (See Comments)    Feel bad  . Lipitor [Atorvastatin] Other (See Comments)    REACTION:  Joint pain  . Spinach Other (See Comments)    Stomach problems  . Vicodin [Hydrocodone-Acetaminophen] Nausea And Vomiting  . Zocor [Simvastatin] Other (See Comments)    REACTION:  "made liver function incorrectly"  . Ace Inhibitors Cough  . Celebrex [Celecoxib] Rash  . Latex Rash and Other (See Comments)    REACTION: red rash  . Penicillins Rash and Other (See Comments)    Has patient had a PCN reaction causing immediate rash, facial/tongue/throat swelling, SOB or lightheadedness with hypotension: No Has patient had a PCN reaction causing severe rash involving mucus membranes or skin necrosis: No Has patient had a PCN reaction that required hospitalization: No- MD office Has patient had a PCN reaction occurring within the last 10 years: No If all of the above answers are "NO", then may proceed with Cephalosporin use.   . Sulfonamide Derivatives Rash    Medications Reviewed Today    Reviewed by Dionne Milo, [facility] (Pharmacist) on 08/20/18 at 1213  Med List Status: <None>  Medication Order Taking? Sig Documenting Provider Last Dose Status Informant  albuterol (PROAIR HFA)  108 (90 Base) MCG/ACT inhaler [id number] Yes INHALE 2 PUFFS EVERY SIX HOURS AS NEEDED FOR WHEEZING OR SHORTNESS OF BREATH  Patient taking differently:  Inhale 1 puff into the lungs every 6 (six) hours as needed for wheezing or shortness of breath.    Chipper Herb, MD Taking Active Self  amLODipine (NORVASC) 10 MG tablet [id number] Yes Take 1 tablet (10 mg total) by mouth daily. Chipper Herb, MD Taking Active Self  aspirin 325 MG EC tablet [id number] No Take 1 tablet (325 mg total) by mouth daily. Hold for 5 days.  Patient not taking:  Reported on 08/20/2018    Barton Dubois, MD Not Taking Active   Carboxymethylcellulose Sodium (REFRESH LIQUIGEL) 1 % GEL [id number] Yes Place 1 drop into both eyes 2 (two) times daily.  [provider] Taking Active Self  CINNAMON PO [id number]  Take 1,000 capsules by mouth daily as needed (for blood sugars).  [provider]  Active Self  clopidogrel (PLAVIX) 75 MG tablet [id number] No TAKE ONE TABLET BY MOUTH DAILY (PLEASE USE AUROBINDO BRAND, PT HD FOR ALLERGY TO REDDY). Hold for 5 days  Patient not taking:  Reported on 08/20/2018   Barton Dubois, MD Not Taking Active   cyproheptadine (PERIACTIN) 4 MG tablet [id number] Yes Take 2 mg by mouth daily as needed for allergies. [provider] Taking Active Self  escitalopram (LEXAPRO) 10 MG tablet [id number] No TAKE 1 TABLET(10 MG) BY MOUTH DAILY  Patient not taking:  No sig reported   Chipper Herb, MD Not Taking Active Self  hydrochlorothiazide (MICROZIDE) 12.5 MG capsule [id number] Yes TAKE ONE CAPSULE BY MOUTH EVERY DAY  Patient taking differently:  Take 12.5 mg by mouth daily.    Chipper Herb, MD Taking Active Self  JANUVIA 50 MG tablet [id number] Yes TAKE ONE TABLET BY MOUTH DAILY  Patient taking differently:  Take 50 mg by mouth every morning.    Chipper Herb, MD Taking Active Self  metFORMIN (GLUCOPHAGE) 1000 MG tablet [id number] Yes TAKE ONE TABLET BY MOUTH EVERY MORNING WITH BREAKFAST and TAKE 1/2 TABLET EVERY EVENING WITH FOOD  Patient taking differently:  Take 1,000 mg by mouth daily with breakfast.    Chipper Herb, MD Taking Active Self  metoCLOPramide (REGLAN) 10 MG tablet [id number] No TAKE ONE TABLET BY MOUTH 1 TIME DAILY AS NEEDED  Patient not taking:  Reported on 08/20/2018   Chevis Pretty, FNP Not Taking Active Self  montelukast (SINGULAIR) 5 MG chewable tablet [id number] Yes chew TWO tablets BY MOUTH AT BEDTIME  Patient taking differently:  Chew 5 mg by mouth every other day as needed. At bedtime   Chipper Herb, MD Taking Active Self  pantoprazole (PROTONIX) 40 MG tablet [id number] Yes Take 1 tablet (40 mg total) by mouth 2 (two) times daily. Barton Dubois, MD Taking Active         Discontinued 11/14/11 1453 (Error)   Vitamin D, Ergocalciferol, (DRISDOL) 50000 units CAPS capsule [id number] Yes TAKE 1 CAPSULE BY MOUTH EVERY SEVEN DAYS  Patient taking differently:  Take 50,000 Units by mouth every Wednesday.    Chipper Herb, MD Taking Active Self           Med Note Broadus John Aug 03, 2018 10:47 AM)            ASSESSMENT: Date Discharged from Hospital: 08/18/18 Date Medication Reconciliation Performed: 08/20/2018   Medications Held at Discharge: . Aspirin x 5  days . Clopidogrel x 5 days  Medications with Dose Adjustments at Discharge: . pantoprazole  Patient was recently discharged from hospital and all medications have been reviewed.  Drugs sorted by system:  Neurologic/Psychologic: escitalopram  Cardiovascular: amlodipine, aspirin 325, clopidogrel, hydrochlorthiazide  Pulmonary/Allergy: albuterol MDI, cyproheptadine, montelukast  Gastrointestinal: metoclopramide, pantoprazole  Endocrine: metformin, sitagliptin  Topical: liquid tears  Vitamins/Minerals/Supplements: cinnamon, ergocaliferol  Medication Review Findings:  . Escitalopram- patient reports she was told to hold by her doctor to avoid the potential side effect of drowsiness since she was already so fatigued from anemia. . Per the [employer], cyproheptadine is highly anticholinergic and clearance is reduced with advanced age. Risk of confusion, dry mouth, constipation and other anticholinergic effects or toxicity may occur.  There is strong evidence to avoid use in the elderly.  PLAN: Route note to PCP, Dr. Laurance Flatten.  Joetta Manners, PharmD Clinical Pharmacist [place] [phone] -Instructed patient to take new medications as prescribed and discontinue old medications as  prescribed

## 2018-08-25 ENCOUNTER — Ambulatory Visit (HOSPITAL_COMMUNITY)
Admission: RE | Admit: 2018-08-25 | Discharge: 2018-08-25 | Disposition: A | Discharge status: Home / Self Care | Payer: PPO | Attending: Internal Medicine | Admitting: Internal Medicine

## 2018-08-25 ENCOUNTER — Encounter (HOSPITAL_COMMUNITY)
Admission: RE | Disposition: A | Discharge status: Home / Self Care | Payer: Self-pay | Source: Home / Self Care | Attending: Internal Medicine

## 2018-08-25 ENCOUNTER — Encounter (HOSPITAL_COMMUNITY): Payer: Self-pay | Admitting: *Deleted

## 2018-08-25 DIAGNOSIS — Z880 Allergy status to penicillin: Secondary | ICD-10-CM | POA: Insufficient documentation

## 2018-08-25 DIAGNOSIS — E119 Type 2 diabetes mellitus without complications: Secondary | ICD-10-CM | POA: Diagnosis not present

## 2018-08-25 DIAGNOSIS — Z885 Allergy status to narcotic agent status: Secondary | ICD-10-CM | POA: Diagnosis not present

## 2018-08-25 DIAGNOSIS — K922 Gastrointestinal hemorrhage, unspecified: Secondary | ICD-10-CM | POA: Diagnosis not present

## 2018-08-25 DIAGNOSIS — Z882 Allergy status to sulfonamides status: Secondary | ICD-10-CM | POA: Diagnosis not present

## 2018-08-25 DIAGNOSIS — D509 Iron deficiency anemia, unspecified: Secondary | ICD-10-CM | POA: Diagnosis not present

## 2018-08-25 DIAGNOSIS — F329 Major depressive disorder, single episode, unspecified: Secondary | ICD-10-CM | POA: Diagnosis not present

## 2018-08-25 DIAGNOSIS — Z79899 Other long term (current) drug therapy: Secondary | ICD-10-CM | POA: Diagnosis not present

## 2018-08-25 DIAGNOSIS — Z8673 Personal history of transient ischemic attack (TIA), and cerebral infarction without residual deficits: Secondary | ICD-10-CM | POA: Diagnosis not present

## 2018-08-25 DIAGNOSIS — F419 Anxiety disorder, unspecified: Secondary | ICD-10-CM | POA: Insufficient documentation

## 2018-08-25 DIAGNOSIS — Z7982 Long term (current) use of aspirin: Secondary | ICD-10-CM | POA: Insufficient documentation

## 2018-08-25 DIAGNOSIS — Z886 Allergy status to analgesic agent status: Secondary | ICD-10-CM | POA: Insufficient documentation

## 2018-08-25 DIAGNOSIS — I10 Essential (primary) hypertension: Secondary | ICD-10-CM | POA: Diagnosis not present

## 2018-08-25 DIAGNOSIS — F17210 Nicotine dependence, cigarettes, uncomplicated: Secondary | ICD-10-CM | POA: Diagnosis not present

## 2018-08-25 DIAGNOSIS — J45909 Unspecified asthma, uncomplicated: Secondary | ICD-10-CM | POA: Insufficient documentation

## 2018-08-25 DIAGNOSIS — Z7984 Long term (current) use of oral hypoglycemic drugs: Secondary | ICD-10-CM | POA: Diagnosis not present

## 2018-08-25 HISTORY — PX: GIVENS CAPSULE STUDY: SHX5432

## 2018-08-25 LAB — HEMOGLOBIN AND HEMATOCRIT, BLOOD
HCT: 33.3 % — ABNORMAL LOW (ref 36.0–46.0)
Hemoglobin: 10 g/dL — ABNORMAL LOW (ref 12.0–15.0)

## 2018-08-25 SURGERY — IMAGING PROCEDURE, GI TRACT, INTRALUMINAL, VIA CAPSULE

## 2018-08-25 MED ORDER — SODIUM CHLORIDE 0.9 % IV SOLN: INTRAVENOUS | Status: DC

## 2018-08-25 NOTE — H&P (Signed)
Elizabeth [name] is an 77 y.o. female.   Chief Complaint: Patient is here for small bowel given capsule study. HPI: Patient is 77 year old Caucasian female who was recently discovered to have iron deficiency anemia and heme positive stool.  She underwent EGD and colonoscopy but no bleeding lesion was identified.  She is therefore returning for small bowel study looking for source of bleeding.  Past Medical History:  Diagnosis Date  . Absolute anemia 07/28/2018  . Active smoker   . Allergy   . Anxiety    Takes Xanax for anxiety  . Arthritis   . Asthma   . Cataract   . Chronic airway obstruction, not elsewhere classified   . Clotting disorder ([place])   . Depression   . Diarrhea   . Diverticulosis of colon (without mention of hemorrhage)   . Esophageal reflux   . Family history of malignant neoplasm of gastrointestinal tract   . Headache(784.0)    Irregular  . Neuromuscular disorder ([place])   . Obesity, unspecified   . Other and unspecified hyperlipidemia   . Personal history of colonic polyps   . Stricture and stenosis of esophagus   . Stroke ([place])   . Thyroid disease   . Type II or unspecified type diabetes mellitus without mention of complication, not stated as uncontrolled   . Unspecified asthma(493.90)   . Unspecified essential hypertension     Past Surgical History:  Procedure Laterality Date  . APPENDECTOMY    . BACK SURGERY     Spinal surgery  . CARDIAC CATHETERIZATION  2006   LAD: 30%, RCA : 20%, normal EF, elevated LVEDP  . CARDIAC CATHETERIZATION    . CEREBRAL ANGIOGRAM  12/20/2015  . COLONOSCOPY N/A 08/09/2018   Procedure: COLONOSCOPY;  Surgeon: Rogene Houston, MD;  [facility];  Service: Endoscopy;  Laterality: N/A;  1:55  . ESOPHAGEAL DILATION N/A 08/09/2018   Procedure: ESOPHAGEAL DILATION;  Surgeon: Rogene Houston, MD;  Location: [facility];  Service: Endoscopy;  Laterality: N/A;  . ESOPHAGOGASTRODUODENOSCOPY N/A 08/09/2018   Procedure:  ESOPHAGOGASTRODUODENOSCOPY (EGD);  Surgeon: Rogene Houston, MD;  [facility];  Service: Endoscopy;  Laterality: N/A;  . IR ANGIO INTRA EXTRACRAN SEL COM CAROTID INNOMINATE BILAT MOD SED  01/14/2018  . IR ANGIO INTRA EXTRACRAN SEL COM CAROTID INNOMINATE BILAT MOD SED  05/11/2018  . IR ANGIO VERTEBRAL SEL SUBCLAVIAN INNOMINATE UNI L MOD SED  01/14/2018  . IR ANGIO VERTEBRAL SEL SUBCLAVIAN INNOMINATE UNI L MOD SED  05/11/2018  . IR ANGIO VERTEBRAL SEL VERTEBRAL UNI R MOD SED  01/14/2018  . IR ANGIO VERTEBRAL SEL VERTEBRAL UNI R MOD SED  05/11/2018  . KNEE ARTHROSCOPY     left  . LUMBAR DISC SURGERY    . POLYPECTOMY  08/09/2018   Procedure: POLYPECTOMY;  Surgeon: Rogene Houston, MD;  Location[facility];  Service: Endoscopy;;  ascending colon (CS x 2, HS x5) hepatic flexure (HSx1), recto-sigmoid (HSx1)  . TONSILLECTOMY    . TOTAL ABDOMINAL HYSTERECTOMY     menopause/bleeding    Family History  Problem Relation Age of Onset  . Diabetes Mother   . Heart disease Mother   . Asthma Mother   . Kidney disease Mother   . Drug abuse Mother   . Hypertension Mother   . Mental illness Mother        ?sociopath/psychopath  . Early death Father        trauma  . Colon  cancer Other        first cousin, paternal aunt and uncle  . Cancer Paternal Grandmother        gastric  . Breast cancer Maternal Aunt        several paternal cousins  . Heart disease Maternal Grandmother   . Thyroid cancer Other        aunt  . Thyroid disease Sister        uncertain type-not cancer  . Heart disease Sister   . Heart disease Brother   . Cancer Sister        pancreatic   Social History:  reports that she has been smoking cigarettes. She started smoking about 35 years ago. She has a 20.00 pack-year smoking history. She has never used smokeless tobacco. She reports that she does not drink alcohol or use drugs.  Allergies:  Allergies  Allergen Reactions  . Banana Anaphylaxis  . Codeine Nausea And  Vomiting and Other (See Comments)    PROJECTILE VOMITING  . Stevia [Stevioside] Swelling and Other (See Comments)    Face, tongue, and eye swelling   . Actos [Pioglitazone] Swelling  . Benicar [Olmesartan Medoxomil] Other (See Comments)    Feel bad  . Chantix [Varenicline] Other (See Comments)    "Worked opposite."  . Diovan [Valsartan] Other (See Comments)    Feel bad  . Lipitor [Atorvastatin] Other (See Comments)    REACTION:  Joint pain  . Spinach Other (See Comments)    Stomach problems  . Vicodin [Hydrocodone-Acetaminophen] Nausea And Vomiting  . Zocor [Simvastatin] Other (See Comments)    REACTION:  "made liver function incorrectly"  . Ace Inhibitors Cough  . Celebrex [Celecoxib] Rash  . Latex Rash and Other (See Comments)    REACTION: red rash  . Penicillins Rash and Other (See Comments)    Has patient had a PCN reaction causing immediate rash, facial/tongue/throat swelling, SOB or lightheadedness with hypotension: No Has patient had a PCN reaction causing severe rash involving mucus membranes or skin necrosis: No Has patient had a PCN reaction that required hospitalization: No- MD office Has patient had a PCN reaction occurring within the last 10 years: No If all of the above answers are "NO", then may proceed with Cephalosporin use.   . Sulfonamide Derivatives Rash    Medications Prior to Admission  Medication Sig Dispense Refill  . albuterol (PROAIR HFA) 108 (90 Base) MCG/ACT inhaler INHALE 2 PUFFS EVERY SIX HOURS AS NEEDED FOR WHEEZING OR SHORTNESS OF BREATH (Patient taking differently: Inhale 1 puff into the lungs every 6 (six) hours as needed for wheezing or shortness of breath. ) 8.5 g 2  . amLODipine (NORVASC) 10 MG tablet Take 1 tablet (10 mg total) by mouth daily. 90 tablet 1  . aspirin 325 MG EC tablet Take 1 tablet (325 mg total) by mouth daily. Hold for 5 days.    . Carboxymethylcellulose Sodium (REFRESH LIQUIGEL) 1 % GEL Place 1 drop into both eyes 2 (two)  times daily.     Marland Kitchen CINNAMON PO Take 1,000 capsules by mouth daily as needed (for blood sugars).     . clopidogrel (PLAVIX) 75 MG tablet TAKE ONE TABLET BY MOUTH DAILY (PLEASE USE AUROBINDO BRAND, PT HD FOR ALLERGY TO REDDY). Hold for 5 days 90 tablet 1  . cyproheptadine (PERIACTIN) 4 MG tablet Take 2 mg by mouth daily as needed for allergies.    Marland Kitchen escitalopram (LEXAPRO) 10 MG tablet TAKE 1 TABLET(10 MG) BY MOUTH DAILY  90 tablet 0  . hydrochlorothiazide (MICROZIDE) 12.5 MG capsule TAKE ONE CAPSULE BY MOUTH EVERY DAY (Patient taking differently: Take 12.5 mg by mouth daily. ) 90 capsule 1  . JANUVIA 50 MG tablet TAKE ONE TABLET BY MOUTH DAILY (Patient taking differently: Take 50 mg by mouth every morning. ) 30 tablet 2  . metFORMIN (GLUCOPHAGE) 1000 MG tablet TAKE ONE TABLET BY MOUTH EVERY MORNING WITH BREAKFAST and TAKE 1/2 TABLET EVERY EVENING WITH FOOD (Patient taking differently: Take 1,000 mg by mouth daily with breakfast. ) 135 tablet 0  . metoCLOPramide (REGLAN) 10 MG tablet TAKE ONE TABLET BY MOUTH 1 TIME DAILY AS NEEDED 30 tablet 5  . montelukast (SINGULAIR) 5 MG chewable tablet chew TWO tablets BY MOUTH AT BEDTIME (Patient taking differently: Chew 5 mg by mouth every other day as needed. At bedtime) 60 tablet 2  . pantoprazole (PROTONIX) 40 MG tablet Take 1 tablet (40 mg total) by mouth 2 (two) times daily. 60 tablet 1  . Vitamin D, Ergocalciferol, (DRISDOL) 50000 units CAPS capsule TAKE 1 CAPSULE BY MOUTH EVERY SEVEN DAYS (Patient taking differently: Take 50,000 Units by mouth every Wednesday. ) 12 capsule 3    Results for orders placed or performed during the hospital encounter of 08/25/18 (from the past 48 hour(s))  Hemoglobin and hematocrit, blood     Status: Abnormal   Collection Time: 08/25/18  7:33 AM  Result Value Ref Range   Hemoglobin 10.0 (L) 12.0 - 15.0 g/dL   HCT 33.3 (L) 36.0 - 46.0 %    Comment: Performed at [facility], [address], [place], [place] [address]   No  results found.  ROS  There were no vitals taken for this visit. Physical Exam   Assessment/Plan Iron deficiency anemia second to chronic GI blood loss and no bleeding lesion noted on EGD or colonoscopy. Small bowel given capsule study to complete GI work-up.  Hildred Laser, MD 08/25/2018, 10:49 PM

## 2018-08-26 DIAGNOSIS — R195 Other fecal abnormalities: Secondary | ICD-10-CM | POA: Diagnosis not present

## 2018-08-26 DIAGNOSIS — D509 Iron deficiency anemia, unspecified: Secondary | ICD-10-CM | POA: Diagnosis not present

## 2018-08-26 DIAGNOSIS — Z7901 Long term (current) use of anticoagulants: Secondary | ICD-10-CM | POA: Diagnosis not present

## 2018-08-26 DIAGNOSIS — Z9889 Other specified postprocedural states: Secondary | ICD-10-CM | POA: Diagnosis not present

## 2018-08-26 DIAGNOSIS — K31819 Angiodysplasia of stomach and duodenum without bleeding: Secondary | ICD-10-CM

## 2018-08-26 NOTE — Op Note (Signed)
Small Bowel [place] Capsule Study Procedure date:    Referring Provider:  Dr. Chipper Herb, MD PCP:  Dr. Laurance Flatten, Estella Husk, MD  Indication for procedure:   Patient is 77 year old Caucasian female who was recently found to have iron deficiency anemia secondary to GI blood loss.  EGD and colonoscopy did not reveal any source of blood loss.  She is therefore undergoing small bowel given capsule study to complete work-up.  Her hemoglobin today is 10 g.    Findings:   Patient was able to swallow given capsule without any difficulty. Study duration 7 hours 55 minutes and 48 seconds. Study is complete as given capsule reached colon. Food debris noted in proximal small bowel. There is patchy coating of small bowel mucosa with [place] [id number].  There is pooling of fresh blood best seen on images at [place], [phone] and [phone].  Large clot noted on image at at [phone]. Coffee-ground material noted in distal small bowel best seen on images [id number] and [phone]. No bleeding lesion such as ulcer or AV malformations noted.     First Gastric image: 1 min and 20 sec First Duodenal image: 55 min and 43 sec First Ileo-Cecal Valve image: 3 hrs 23 min and  40 sec First Cecal image: 3 hours 25 min and 57 sec Gastric Passage time: 54 min and 23 sec Small Bowel Passage time: 2 hrs 30 min and 14 sec  Summary & Recommendations:  Suboptimal study because of food debris in proximal small bowel. Fresh blood noted in mid to distal small bowel but no bleeding lesion identified.  Pooling of of blood best seen on images at [id number], [id number] and [phone].  Clot and coffee-ground material noted in distal small bowel.  Suspect bleeding either secondary to angiodysplasia or mucosal injury secondary to aspirin and clopidogrel.  Findings were communicated with the patient earlier today. Patient advised to hold off aspirin for 2 days and thereafter start at a low dose of 81 mg daily. She will continue  clopidogrel as before. Patient advised to report to emergency room should she develop frank melena rectal bleeding or postural symptoms. She has an appointment to see Dr. Laurance Flatten on 09/06/2018 at which time she will have H&H. If there is evidence of continued GI bleeding would consider repeating small bowel study prior to referral for small bowel endoscopy.

## 2018-08-27 ENCOUNTER — Telehealth: Payer: Self-pay | Admitting: Family Medicine

## 2018-08-27 MED ORDER — METFORMIN HCL 1000 MG PO TABS: 1000.0000 mg | ORAL_TABLET | Freq: Every day | ORAL | 3 refills | Status: DC

## 2018-08-27 NOTE — Telephone Encounter (Signed)
Off asa 325 , now on 81 mg Also now taking iron BID. Seeing DR Laural Golden Has appt here 12/30 [place] and will need HGB that day. Spoke with pt

## 2018-08-30 ENCOUNTER — Encounter (HOSPITAL_COMMUNITY): Payer: Self-pay | Admitting: Internal Medicine

## 2018-09-02 ENCOUNTER — Other Ambulatory Visit: Payer: Self-pay | Admitting: Family Medicine

## 2018-09-06 ENCOUNTER — Ambulatory Visit (INDEPENDENT_AMBULATORY_CARE_PROVIDER_SITE_OTHER): Payer: PPO | Admitting: Family Medicine

## 2018-09-06 ENCOUNTER — Encounter: Payer: Self-pay | Admitting: Family Medicine

## 2018-09-06 VITALS — BP 153/70 | HR 88 | Temp 98.5°F | Ht 62.0 in | Wt 187.0 lb

## 2018-09-06 DIAGNOSIS — D500 Iron deficiency anemia secondary to blood loss (chronic): Secondary | ICD-10-CM

## 2018-09-06 DIAGNOSIS — R011 Cardiac murmur, unspecified: Secondary | ICD-10-CM | POA: Diagnosis not present

## 2018-09-06 DIAGNOSIS — S90414A Abrasion, right lesser toe(s), initial encounter: Secondary | ICD-10-CM

## 2018-09-06 DIAGNOSIS — Z09 Encounter for follow-up examination after completed treatment for conditions other than malignant neoplasm: Secondary | ICD-10-CM | POA: Diagnosis not present

## 2018-09-06 DIAGNOSIS — E871 Hypo-osmolality and hyponatremia: Secondary | ICD-10-CM | POA: Diagnosis not present

## 2018-09-06 LAB — HEMOGLOBIN, FINGERSTICK: Hemoglobin: 11.5 g/dL (ref 11.1–15.9)

## 2018-09-06 NOTE — Progress Notes (Signed)
Subjective: CC: Hospital follow up PCP: Chipper Herb, MD Elizabeth [name] is a 77 y.o. female presenting to clinic today for:  1. Hospital follow up Patient was discharged from hospital about 2 weeks ago.  She was admitted for acute blood loss anemia secondary to GI bleed.  This was thought to be related to recent polypectomy.  She was given 2 units of packed red blood cells.  Hemoglobin at discharge was 10.5.  Recommendations were to continue PPI twice daily and follow-up with gastroenterology outpatient.  Additionally, she was noted to be hyponatremic during hospitalization.  This was thought to be secondary to her diuretic and hyperglycemia.  She notes that since discharge she has been doing fine.  Denies blood in stool.  She notes that she had some nausea but it was related to sinus drainage.  Denies any abdominal pain.  No dizziness, lightheadedness.  She has been taking the medications as directed.  She has not followed up with gastroenterology yet but plans to.  She also has follow-up with cardiology.   ROS: Per HPI  Allergies  Allergen Reactions  . Banana Anaphylaxis  . Codeine Nausea And Vomiting and Other (See Comments)    PROJECTILE VOMITING  . Stevia [Stevioside] Swelling and Other (See Comments)    Face, tongue, and eye swelling   . Actos [Pioglitazone] Swelling  . Benicar [Olmesartan Medoxomil] Other (See Comments)    Feel bad  . Chantix [Varenicline] Other (See Comments)    "Worked opposite."  . Diovan [Valsartan] Other (See Comments)    Feel bad  . Lipitor [Atorvastatin] Other (See Comments)    REACTION:  Joint pain  . Spinach Other (See Comments)    Stomach problems  . Vicodin [Hydrocodone-Acetaminophen] Nausea And Vomiting  . Zocor [Simvastatin] Other (See Comments)    REACTION:  "made liver function incorrectly"  . Ace Inhibitors Cough  . Celebrex [Celecoxib] Rash  . Latex Rash and Other (See Comments)    REACTION: red rash  . Penicillins Rash and Other  (See Comments)    Has patient had a PCN reaction causing immediate rash, facial/tongue/throat swelling, SOB or lightheadedness with hypotension: No Has patient had a PCN reaction causing severe rash involving mucus membranes or skin necrosis: No Has patient had a PCN reaction that required hospitalization: No- MD office Has patient had a PCN reaction occurring within the last 10 years: No If all of the above answers are "NO", then may proceed with Cephalosporin use.   . Sulfonamide Derivatives Rash   Past Medical History:  Diagnosis Date  . Absolute anemia 07/28/2018  . Active smoker   . Allergy   . Anxiety    Takes Xanax for anxiety  . Arthritis   . Asthma   . Cataract   . Chronic airway obstruction, not elsewhere classified   . Clotting disorder ([place])   . Depression   . Diarrhea   . Diverticulosis of colon (without mention of hemorrhage)   . Esophageal reflux   . Family history of malignant neoplasm of gastrointestinal tract   . Headache(784.0)    Irregular  . Neuromuscular disorder ([place])   . Obesity, unspecified   . Other and unspecified hyperlipidemia   . Personal history of colonic polyps   . Stricture and stenosis of esophagus   . Stroke ([place])   . Thyroid disease   . Type II or unspecified type diabetes mellitus without mention of complication, not stated as uncontrolled   . Unspecified asthma(493.90)   .  Unspecified essential hypertension     Current Outpatient Medications:  .  albuterol (PROAIR HFA) 108 (90 Base) MCG/ACT inhaler, INHALE 2 PUFFS EVERY SIX HOURS AS NEEDED FOR WHEEZING OR SHORTNESS OF BREATH (Patient taking differently: Inhale 1 puff into the lungs every 6 (six) hours as needed for wheezing or shortness of breath. ), Disp: 8.5 g, Rfl: 2 .  amLODipine (NORVASC) 10 MG tablet, Take 1 tablet (10 mg total) by mouth daily., Disp: 90 tablet, Rfl: 1 .  aspirin 325 MG EC tablet, Take 1 tablet (325 mg total) by mouth daily. Hold for 5 days., Disp: , Rfl:  .   Carboxymethylcellulose Sodium (REFRESH LIQUIGEL) 1 % GEL, Place 1 drop into both eyes 2 (two) times daily. , Disp: , Rfl:  .  CINNAMON PO, Take 1,000 capsules by mouth daily as needed (for blood sugars). , Disp: , Rfl:  .  clopidogrel (PLAVIX) 75 MG tablet, TAKE ONE TABLET BY MOUTH DAILY (PLEASE USE AUROBINDO BRAND, PT HD FOR ALLERGY TO REDDY). Hold for 5 days, Disp: 90 tablet, Rfl: 1 .  cyproheptadine (PERIACTIN) 4 MG tablet, Take 2 mg by mouth daily as needed for allergies., Disp: , Rfl:  .  escitalopram (LEXAPRO) 10 MG tablet, TAKE 1 TABLET(10 MG) BY MOUTH DAILY, Disp: 90 tablet, Rfl: 0 .  hydrochlorothiazide (MICROZIDE) 12.5 MG capsule, TAKE ONE CAPSULE BY MOUTH EVERY DAY (Patient taking differently: Take 12.5 mg by mouth daily. ), Disp: 90 capsule, Rfl: 1 .  JANUVIA 50 MG tablet, TAKE ONE TABLET BY MOUTH DAILY (Patient taking differently: Take 50 mg by mouth every morning. ), Disp: 30 tablet, Rfl: 2 .  metFORMIN (GLUCOPHAGE) 1000 MG tablet, Take 1 tablet (1,000 mg total) by mouth daily with breakfast., Disp: 90 tablet, Rfl: 3 .  metoCLOPramide (REGLAN) 10 MG tablet, TAKE ONE TABLET BY MOUTH 1 TIME DAILY AS NEEDED, Disp: 30 tablet, Rfl: 5 .  montelukast (SINGULAIR) 5 MG chewable tablet, chew TWO tablets BY MOUTH AT BEDTIME (Patient taking differently: Chew 5 mg by mouth every other day as needed. At bedtime), Disp: 60 tablet, Rfl: 2 .  pantoprazole (PROTONIX) 40 MG tablet, Take 1 tablet (40 mg total) by mouth 2 (two) times daily., Disp: 60 tablet, Rfl: 1 .  Vitamin D, Ergocalciferol, (DRISDOL) 50000 units CAPS capsule, TAKE 1 CAPSULE BY MOUTH EVERY SEVEN DAYS (Patient taking differently: Take 50,000 Units by mouth every Wednesday. ), Disp: 12 capsule, Rfl: 3 Social History   Socioeconomic History  . Marital status: Married    Spouse name: [name]  . Number of children: 0  . Years of education: 16  . Highest education level: Bachelor's degree (e.g., BA, AB, BS)  Occupational History  .  Occupation: Therapist, sports    Comment: [employer]  Social Needs  . Financial resource strain: Not hard at all  . Food insecurity:    Worry: Never true    Inability: Never true  . Transportation needs:    Medical: No    Non-medical: No  Tobacco Use  . Smoking status: Current Every Day Smoker    Packs/day: 1.00    Years: 20.00    Pack years: 20.00    Types: Cigarettes    Start date: 01/15/1983  . Smokeless tobacco: Never Used  Substance and Sexual Activity  . Alcohol use: No    Alcohol/week: 0.0 standard drinks  . Drug use: No  . Sexual activity: Not Currently  Lifestyle  . Physical activity:    Days per  week: 0 days    Minutes per session: 0 min  . Stress: To some extent  Relationships  . Social connections:    Talks on phone: Not on file    Gets together: Not on file    Attends religious service: Not on file    Active member of club or organization: Not on file    Attends meetings of clubs or organizations: Not on file    Relationship status: Not on file  . Intimate partner violence:    Fear of current or ex partner: No    Emotionally abused: No    Physically abused: No    Forced sexual activity: No  Other Topics Concern  . Not on file  Social History Narrative   Married [name]   Limited activity due to back pain   Family History  Problem Relation Age of Onset  . Diabetes Mother   . Heart disease Mother   . Asthma Mother   . Kidney disease Mother   . Drug abuse Mother   . Hypertension Mother   . Mental illness Mother        ?sociopath/psychopath  . Early death Father        trauma  . Colon cancer Other        first cousin, paternal aunt and uncle  . Cancer Paternal Grandmother        gastric  . Breast cancer Maternal Aunt        several paternal cousins  . Heart disease Maternal Grandmother   . Thyroid cancer Other        aunt  . Thyroid disease Sister        uncertain type-not cancer  . Heart disease Sister   . Heart disease Brother   . Cancer  Sister        pancreatic    Objective: Office vital signs reviewed. BP (!) 153/70   Pulse 88   Temp 98.5 F (36.9 C) (Oral)   Ht 5\' 2"  (1.575 m)   Wt 187 lb (84.8 kg)   BMI 34.20 kg/m   Physical Examination:  General: Awake, alert, chronically ill appearing, No acute distress HEENT: Normal, sclera white, MMM Cardio: regular rate and rhythm, O7F6 heard, systolic heart murmur appreciated at bilateral sternal borders Pulm: clear to auscultation bilaterally, no wheezes, rhonchi or rales; normal work of breathing on room air Extremities: warm, well perfused, No edema, cyanosis or clubbing; +2 pulses bilaterally Skin: dry; she has a healing abrasion along the right ventral middle toe at the DIP joint.  There is minimal surrounding erythema.  No increased warmth or fluctuance.  No active drainage.  Assessment/ Plan: 77 y.o. female   1. Anemia due to GI blood loss Recheck hemoglobin today.  She is to follow-up with gastroenterology as directed.  Continue PPI. - Hemoglobin, fingerstick  2. Hospital discharge follow-up I have reviewed her hospital discharge recommendations and labs. - Basic Metabolic Panel - Hemoglobin, fingerstick  3. Hyponatremia Possibly related to hydrochlorothiazide versus SSRI. - Basic Metabolic Panel  4. Abrasion of second toe of right foot, initial encounter No overt evidence of infection.  We have discussed continued use of topical antibiotic cream.  Signs and symptoms of significant infection or complications were discussed with the patient and she will follow with PCP in the next 2 to 4 weeks for recheck.  5. Heart murmur, systolic Per her report she will be following up with cardiology in the next several weeks for this.  Currently  asymptomatic.   Orders Placed This Encounter  Procedures  . Basic Metabolic Panel  . Hemoglobin, fingerstick   No orders of the defined types were placed in this encounter.    Janora Norlander, DO [place] [phone]

## 2018-09-06 NOTE — Patient Instructions (Addendum)
You had labs performed today.  You will be contacted with the results of the labs once they are available, usually in the next 3 business days for routine lab work.   Use the bactroban on the right middle toe twice daily for 1 week.  If it show signs of infection, you need to be seen.  Follow up with Dr Laurance Flatten in 2-4 weeks for recheck.

## 2018-09-07 LAB — BASIC METABOLIC PANEL
BUN/Creatinine Ratio: 8 — ABNORMAL LOW (ref 12–28)
BUN: 7 mg/dL — ABNORMAL LOW (ref 8–27)
CO2: 24 mmol/L (ref 20–29)
Calcium: 10.3 mg/dL (ref 8.7–10.3)
Chloride: 90 mmol/L — ABNORMAL LOW (ref 96–106)
Creatinine, Ser: 0.87 mg/dL (ref 0.57–1.00)
GFR calc Af Amer: 75 mL/min/{1.73_m2} (ref >59)
GFR calc non Af Amer: 65 mL/min/{1.73_m2} (ref >59)
Glucose: 142 mg/dL — ABNORMAL HIGH (ref 65–99)
Potassium: 3.6 mmol/L (ref 3.5–5.2)
Sodium: 132 mmol/L — ABNORMAL LOW (ref 134–144)

## 2018-09-09 ENCOUNTER — Telehealth: Payer: Self-pay | Admitting: Family Medicine

## 2018-09-09 NOTE — Telephone Encounter (Signed)
This looks like it was reviewed by Dr. Lajuana Ripple

## 2018-09-09 NOTE — Telephone Encounter (Signed)
PT states that she is needing to know her lab results so she can discuss them with another provider, she is aware that Dr Darnell Level is out today and will be back tomorrow, can AJ review results??

## 2018-09-09 NOTE — Telephone Encounter (Signed)
Noted and looks like Elizabeth [name] spoke with her today.

## 2018-09-20 ENCOUNTER — Ambulatory Visit (INDEPENDENT_AMBULATORY_CARE_PROVIDER_SITE_OTHER): Payer: PPO | Admitting: Family Medicine

## 2018-09-20 ENCOUNTER — Encounter: Payer: Self-pay | Admitting: Family Medicine

## 2018-09-20 VITALS — BP 134/70 | HR 96 | Temp 98.1°F | Ht 62.0 in | Wt 183.0 lb

## 2018-09-20 DIAGNOSIS — I10 Essential (primary) hypertension: Secondary | ICD-10-CM

## 2018-09-20 DIAGNOSIS — D500 Iron deficiency anemia secondary to blood loss (chronic): Secondary | ICD-10-CM | POA: Diagnosis not present

## 2018-09-20 LAB — HEMOGLOBIN, FINGERSTICK: Hemoglobin: 11.5 g/dL (ref 11.1–15.9)

## 2018-09-20 NOTE — Progress Notes (Signed)
Subjective: CC: HTN follow up/ GI bleed PCP: Chipper Herb, MD Elizabeth [name] is a 78 y.o. female presenting to clinic today for:  1. HTN BP uncontrolled at last visit 153/70.  She has a blood pressures are typically running in the 130s over 70s at home.  She does not endorse any chest pain or shortness of breath. She is compliant with her medications.  2.  Anemia Patient was seen 2 weeks ago after she was discharged from the hospital for a GI bleed.  Her hemoglobin was 11.5, up from 10.0 at discharge from hospital.  She denies any recurrent GI bleed including hematochezia, melena.  No nausea or vomiting.  She does report chronically suppressed appetite.  She has not yet followed up with gastroenterology yet.  No dizziness, lightheadedness or LOC.  ROS: Per HPI  Allergies  Allergen Reactions  . Banana Anaphylaxis  . Codeine Nausea And Vomiting and Other (See Comments)    PROJECTILE VOMITING  . Stevia [Stevioside] Swelling and Other (See Comments)    Face, tongue, and eye swelling   . Actos [Pioglitazone] Swelling  . Benicar [Olmesartan Medoxomil] Other (See Comments)    Feel bad  . Chantix [Varenicline] Other (See Comments)    "Worked opposite."  . Diovan [Valsartan] Other (See Comments)    Feel bad  . Lipitor [Atorvastatin] Other (See Comments)    REACTION:  Joint pain  . Spinach Other (See Comments)    Stomach problems  . Vicodin [Hydrocodone-Acetaminophen] Nausea And Vomiting  . Zocor [Simvastatin] Other (See Comments)    REACTION:  "made liver function incorrectly"  . Ace Inhibitors Cough  . Celebrex [Celecoxib] Rash  . Latex Rash and Other (See Comments)    REACTION: red rash  . Penicillins Rash and Other (See Comments)    Has patient had a PCN reaction causing immediate rash, facial/tongue/throat swelling, SOB or lightheadedness with hypotension: No Has patient had a PCN reaction causing severe rash involving mucus membranes or skin necrosis: No Has patient had  a PCN reaction that required hospitalization: No- MD office Has patient had a PCN reaction occurring within the last 10 years: No If all of the above answers are "NO", then may proceed with Cephalosporin use.   . Sulfonamide Derivatives Rash   Past Medical History:  Diagnosis Date  . Absolute anemia 07/28/2018  . Active smoker   . Allergy   . Anxiety    Takes Xanax for anxiety  . Arthritis   . Asthma   . Cataract   . Chronic airway obstruction, not elsewhere classified   . Clotting disorder ([place])   . Depression   . Diarrhea   . Diverticulosis of colon (without mention of hemorrhage)   . Esophageal reflux   . Family history of malignant neoplasm of gastrointestinal tract   . Headache(784.0)    Irregular  . Neuromuscular disorder ([place])   . Obesity, unspecified   . Other and unspecified hyperlipidemia   . Personal history of colonic polyps   . Stricture and stenosis of esophagus   . Stroke ([place])   . Thyroid disease   . Type II or unspecified type diabetes mellitus without mention of complication, not stated as uncontrolled   . Unspecified asthma(493.90)   . Unspecified essential hypertension     Current Outpatient Medications:  .  albuterol (PROAIR HFA) 108 (90 Base) MCG/ACT inhaler, INHALE 2 PUFFS EVERY SIX HOURS AS NEEDED FOR WHEEZING OR SHORTNESS OF BREATH (Patient taking differently: Inhale 1 puff  into the lungs every 6 (six) hours as needed for wheezing or shortness of breath. ), Disp: 8.5 g, Rfl: 2 .  amLODipine (NORVASC) 10 MG tablet, Take 1 tablet (10 mg total) by mouth daily., Disp: 90 tablet, Rfl: 1 .  aspirin 325 MG EC tablet, Take 1 tablet (325 mg total) by mouth daily. Hold for 5 days. (Patient not taking: Reported on 09/06/2018), Disp: , Rfl:  .  aspirin 81 MG chewable tablet, Chew 81 mg by mouth 2 (two) times daily., Disp: , Rfl:  .  Carboxymethylcellulose Sodium (REFRESH LIQUIGEL) 1 % GEL, Place 1 drop into both eyes 2 (two) times daily. , Disp: , Rfl:  .   CINNAMON PO, Take 1,000 capsules by mouth daily as needed (for blood sugars). , Disp: , Rfl:  .  clopidogrel (PLAVIX) 75 MG tablet, TAKE ONE TABLET BY MOUTH DAILY (PLEASE USE AUROBINDO BRAND, PT HD FOR ALLERGY TO REDDY). Hold for 5 days, Disp: 90 tablet, Rfl: 1 .  cyproheptadine (PERIACTIN) 4 MG tablet, Take 2 mg by mouth daily as needed for allergies., Disp: , Rfl:  .  escitalopram (LEXAPRO) 10 MG tablet, TAKE 1 TABLET(10 MG) BY MOUTH DAILY, Disp: 90 tablet, Rfl: 0 .  hydrochlorothiazide (MICROZIDE) 12.5 MG capsule, TAKE ONE CAPSULE BY MOUTH EVERY DAY (Patient taking differently: Take 12.5 mg by mouth daily. ), Disp: 90 capsule, Rfl: 1 .  JANUVIA 50 MG tablet, TAKE ONE TABLET BY MOUTH DAILY (Patient taking differently: Take 50 mg by mouth every morning. ), Disp: 30 tablet, Rfl: 2 .  metFORMIN (GLUCOPHAGE) 1000 MG tablet, Take 1 tablet (1,000 mg total) by mouth daily with breakfast., Disp: 90 tablet, Rfl: 3 .  metoCLOPramide (REGLAN) 10 MG tablet, TAKE ONE TABLET BY MOUTH 1 TIME DAILY AS NEEDED, Disp: 30 tablet, Rfl: 5 .  montelukast (SINGULAIR) 5 MG chewable tablet, chew TWO tablets BY MOUTH AT BEDTIME (Patient taking differently: Chew 5 mg by mouth at bedtime. At bedtime), Disp: 60 tablet, Rfl: 2 .  pantoprazole (PROTONIX) 40 MG tablet, Take 1 tablet (40 mg total) by mouth 2 (two) times daily., Disp: 60 tablet, Rfl: 1 .  Vitamin D, Ergocalciferol, (DRISDOL) 50000 units CAPS capsule, TAKE 1 CAPSULE BY MOUTH EVERY SEVEN DAYS (Patient taking differently: Take 50,000 Units by mouth every Wednesday. ), Disp: 12 capsule, Rfl: 3 Social History   Socioeconomic History  . Marital status: Married    Spouse name: [name]  . Number of children: 0  . Years of education: 16  . Highest education level: Bachelor's degree (e.g., BA, AB, BS)  Occupational History  . Occupation: Therapist, sports    Comment: [employer]  Social Needs  . Financial resource strain: Not hard at all  . Food insecurity:    Worry: Never  true    Inability: Never true  . Transportation needs:    Medical: No    Non-medical: No  Tobacco Use  . Smoking status: Current Every Day Smoker    Packs/day: 1.00    Years: 20.00    Pack years: 20.00    Types: Cigarettes    Start date: 01/15/1983  . Smokeless tobacco: Never Used  Substance and Sexual Activity  . Alcohol use: No    Alcohol/week: 0.0 standard drinks  . Drug use: No  . Sexual activity: Not Currently  Lifestyle  . Physical activity:    Days per week: 0 days    Minutes per session: 0 min  . Stress: To some extent  Relationships  . Social connections:    Talks on phone: Not on file    Gets together: Not on file    Attends religious service: Not on file    Active member of club or organization: Not on file    Attends meetings of clubs or organizations: Not on file    Relationship status: Not on file  . Intimate partner violence:    Fear of current or ex partner: No    Emotionally abused: No    Physically abused: No    Forced sexual activity: No  Other Topics Concern  . Not on file  Social History Narrative   Married [name]   Limited activity due to back pain   Family History  Problem Relation Age of Onset  . Diabetes Mother   . Heart disease Mother   . Asthma Mother   . Kidney disease Mother   . Drug abuse Mother   . Hypertension Mother   . Mental illness Mother        ?sociopath/psychopath  . Early death Father        trauma  . Colon cancer Other        first cousin, paternal aunt and uncle  . Cancer Paternal Grandmother        gastric  . Breast cancer Maternal Aunt        several paternal cousins  . Heart disease Maternal Grandmother   . Thyroid cancer Other        aunt  . Thyroid disease Sister        uncertain type-not cancer  . Heart disease Sister   . Heart disease Brother   . Cancer Sister        pancreatic    Objective: Office vital signs reviewed. BP (!) 150/79   Pulse 96   Temp 98.1 F (36.7 C) (Oral)   Ht 5\' 2"  (1.575  m)   Wt 183 lb (83 kg)   BMI 33.47 kg/m   Physical Examination:  General: Awake, alert, chronically ill appearing, No acute distress HEENT: Normal, sclera white, MMM, mild conjunctival pallor Cardio: regular rate and rhythm, C9O7 heard, systolic heart murmur appreciated at bilateral sternal borders Pulm: clear to auscultation bilaterally, no wheezes, rhonchi or rales; normal work of breathing on room air  Assessment/ Plan: 78 y.o. female   1. Blood loss anemia Hemoglobin checked 2 weeks ago was 11.5.  This was repeated today.  I encouraged her to follow-up with gastroenterology as scheduled. - Hemoglobin, fingerstick  2. Essential hypertension Blood pressure now within normal limits after rechecked. 134/70.  Continue to follow-up with PCP for chronic care needs.   Orders Placed This Encounter  Procedures  . Hemoglobin, fingerstick   No orders of the defined types were placed in this encounter.    Janora Norlander, DO [place] [phone]

## 2018-09-22 ENCOUNTER — Telehealth: Payer: Self-pay | Admitting: Family Medicine

## 2018-09-22 MED ORDER — AZELASTINE HCL 0.1 % NA SOLN: 1.0000 | Freq: Two times a day (BID) | NASAL | 12 refills | Status: DC

## 2018-09-22 NOTE — Telephone Encounter (Signed)
Pt requesting Astelin Rx has not used in [place] If appropriate pt uses [facility]

## 2018-09-22 NOTE — Telephone Encounter (Signed)
What is the name of the medication? Astelin Nasal Spray  Have you contacted your pharmacy to request a refill? NO  Which pharmacy would you like this sent to? [facility] in [place]   Patient notified that their request is being sent to the clinical staff for review and that they should receive a call once it is complete. If they do not receive a call within 24 hours they can check with their pharmacy or our office.

## 2018-09-22 NOTE — Telephone Encounter (Signed)
Med sent in.

## 2018-10-02 ENCOUNTER — Other Ambulatory Visit: Payer: Self-pay | Admitting: Family Medicine

## 2018-11-02 ENCOUNTER — Other Ambulatory Visit: Payer: Self-pay | Admitting: Family Medicine

## 2018-11-03 ENCOUNTER — Telehealth (HOSPITAL_COMMUNITY): Payer: Self-pay

## 2018-11-03 NOTE — Telephone Encounter (Signed)
Called to schedule f/u mri, no answer. AW

## 2018-11-10 ENCOUNTER — Ambulatory Visit (INDEPENDENT_AMBULATORY_CARE_PROVIDER_SITE_OTHER): Payer: PPO

## 2018-11-10 ENCOUNTER — Other Ambulatory Visit (HOSPITAL_COMMUNITY): Payer: Self-pay | Admitting: Interventional Radiology

## 2018-11-10 ENCOUNTER — Encounter: Payer: Self-pay | Admitting: Family Medicine

## 2018-11-10 ENCOUNTER — Ambulatory Visit (INDEPENDENT_AMBULATORY_CARE_PROVIDER_SITE_OTHER): Payer: PPO | Admitting: Family Medicine

## 2018-11-10 VITALS — BP 155/72 | HR 71 | Temp 98.2°F | Ht 62.0 in | Wt 180.0 lb

## 2018-11-10 DIAGNOSIS — E079 Disorder of thyroid, unspecified: Secondary | ICD-10-CM

## 2018-11-10 DIAGNOSIS — E7849 Other hyperlipidemia: Secondary | ICD-10-CM | POA: Diagnosis not present

## 2018-11-10 DIAGNOSIS — I729 Aneurysm of unspecified site: Secondary | ICD-10-CM

## 2018-11-10 DIAGNOSIS — I10 Essential (primary) hypertension: Secondary | ICD-10-CM

## 2018-11-10 DIAGNOSIS — E1142 Type 2 diabetes mellitus with diabetic polyneuropathy: Secondary | ICD-10-CM | POA: Diagnosis not present

## 2018-11-10 DIAGNOSIS — K219 Gastro-esophageal reflux disease without esophagitis: Secondary | ICD-10-CM | POA: Diagnosis not present

## 2018-11-10 DIAGNOSIS — I2510 Atherosclerotic heart disease of native coronary artery without angina pectoris: Secondary | ICD-10-CM | POA: Diagnosis not present

## 2018-11-10 DIAGNOSIS — J449 Chronic obstructive pulmonary disease, unspecified: Secondary | ICD-10-CM | POA: Diagnosis not present

## 2018-11-10 DIAGNOSIS — I771 Stricture of artery: Secondary | ICD-10-CM

## 2018-11-10 DIAGNOSIS — E1140 Type 2 diabetes mellitus with diabetic neuropathy, unspecified: Secondary | ICD-10-CM | POA: Diagnosis not present

## 2018-11-10 DIAGNOSIS — E559 Vitamin D deficiency, unspecified: Secondary | ICD-10-CM | POA: Diagnosis not present

## 2018-11-10 DIAGNOSIS — I639 Cerebral infarction, unspecified: Secondary | ICD-10-CM

## 2018-11-10 LAB — BAYER DCA HB A1C WAIVED: HB A1C (BAYER DCA - WAIVED): 9.5 % — ABNORMAL HIGH (ref <7.0)

## 2018-11-10 MED ORDER — PANTOPRAZOLE SODIUM 40 MG PO TBEC: 40.0000 mg | DELAYED_RELEASE_TABLET | Freq: Two times a day (BID) | ORAL | 3 refills | Status: DC

## 2018-11-10 MED ORDER — GABAPENTIN 100 MG PO CAPS: 100.0000 mg | ORAL_CAPSULE | Freq: Every day | ORAL | 1 refills | Status: DC

## 2018-11-10 NOTE — Patient Instructions (Addendum)
Medicare Annual Wellness Visit  [facility] and the medical providers at [facility] strive to bring you the best medical care.  In doing so we not only want to address your current medical conditions and concerns but also to detect new conditions early and prevent illness, disease and health-related problems.    Medicare offers a yearly Wellness Visit which allows our clinical staff to assess your need for preventative services including immunizations, lifestyle education, counseling to decrease risk of preventable diseases and screening for fall risk and other medical concerns.    This visit is provided free of charge (no copay) for all Medicare recipients. The clinical pharmacists at [facility] have begun to conduct these Wellness Visits which will also include a thorough review of all your medications.    As you primary medical provider recommend that you make an appointment for your Annual Wellness Visit if you have not done so already this year.  You may set up this appointment before you leave today or you may call back ([phone]) and schedule an appointment.  Please make sure when you call that you mention that you are scheduling your Annual Wellness Visit with the clinical pharmacist so that the appointment may be made for the proper length of time.     Continue current medications. Continue good therapeutic lifestyle changes which include good diet and exercise. Fall precautions discussed with patient. If an FOBT was given today- please return it to our front desk. If you are over 47 years old - you may need Prevnar 101 or the adult Pneumonia vaccine.  **Flu shots are available--- please call and schedule a FLU-CLINIC appointment**  After your visit with [place] today you will receive a survey in the mail or online from [employer] regarding your care with [place]. Please take a moment to fill this out. Your feedback is very  important to [place] as you can help [place] better understand your patient needs as well as improve your experience and satisfaction. WE CARE ABOUT YOU!!!   Follow-up with gastroenterology as planned Follow-up with ophthalmology as planned Continue to monitor blood sugars and blood pressures at home watching sodium intake closely. We will make sure that your gastroenterologist gets a copy of the lab work that is being done today.

## 2018-11-10 NOTE — Progress Notes (Signed)
Subjective:    Patient ID: Elizabeth [name], female    DOB: [birth year], 78 y.o.   MRN: [id number]  HPI Pt here for follow up and management of chronic medical problems which includes hypertension and diabetes. She is taking medication regularly.  Patient comes in today with no complaints.  She is requesting a refill on her Protonix.  The systolic blood pressure was elevated on 2 occasions.  She will get a chest x-ray today and will get lab work today.  This patient has a lot of problems when looking at her history including allergies anemia anxiety COPD depression diabetes hyperlipidemia hypertension past CVA and thyroid disease.  She did have a colonoscopy in December because of anemia and chronic GI blood loss.  He had multiple polyps removed during this colonoscopy and diverticulosis was also found.  She had a tubular adenoma with no high-grade degree of malignancy.  She had a tubular adenoma with no high-grade findings of dysplasia.  The patient is somewhat upset about a recent hospitalization for when she had her colonoscopy and not with the physician but with the hospital setting and billing issues.  She does have a follow-up appointment with her gastroenterologist 6 months from December which she plans to keep.  She really likes him.  She denies today any chest pain pressure tightness or shortness of breath.  She denies any current problems with her stomach including nausea vomiting diarrhea blood in the stool that she can see or black tarry bowel movements.  With the colonoscopy she had many polyps.  She denies any trouble passing her water.  She says that her blood sugars at home of been running in the 1 20-1 30 range fasting.  She also did not like her recent visit to her optometrist.    Patient Active Problem List   Diagnosis Date Noted  . Rectal bleeding   . Acute blood loss anemia 08/17/2018  . Iron deficiency anemia 08/11/2018  . Absolute anemia 07/28/2018  . Esophageal dysphagia 07/28/2018   . Stroke ([place])   . Elevated liver function tests 10/22/2017  . Anxiety and depression 10/21/2017  . Abdominal aortic atherosclerosis ([place]) 10/21/2017  . Type 2 diabetes mellitus with hyperlipidemia ([place]) 09/06/2015  . Type 2 diabetes mellitus with peripheral neuropathy ([place]) 11/27/2014  . CVA (cerebral vascular accident) ([place]) 02/27/2012  . Cerebral aneurysm 02/27/2012  . Tobacco abuse 02/27/2012  . 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 Gait instability 02/27/2012  . Vision disturbance following cerebrovascular accident 02/27/2012  . Dyspnea 11/14/2011  . Thyroid disease 10/12/2008  . Diabetes ([place]) 08/29/2008  . Hyperlipidemia 08/29/2008  . Obesity, unspecified 08/29/2008  . COPD (chronic obstructive pulmonary disease) ([place]) 08/29/2008  . WEIGHT LOSS 08/29/2008  . DIARRHEA 08/29/2008  . HTN (hypertension) 08/28/2008  . ASTHMA 08/28/2008  . ESOPHAGEAL STRICTURE 08/28/2008  . GERD (gastroesophageal reflux disease) 08/28/2008  . HIATAL HERNIA 08/28/2008  . Diverticulosis of colon 08/28/2008  . COLONIC POLYPS, HX OF 08/28/2008   Outpatient Encounter Medications as of 11/10/2018  Medication Sig  . albuterol (PROAIR HFA) 108 (90 Base) MCG/ACT inhaler INHALE 2 PUFFS EVERY SIX HOURS AS NEEDED FOR WHEEZING OR SHORTNESS OF BREATH (Patient taking differently: Inhale 1 puff into the lungs every 6 (six) hours as needed for wheezing or shortness of breath. )  . amLODipine (NORVASC) 10 MG tablet TAKE 1 TABLET(10 MG) BY MOUTH DAILY  . aspirin 81 MG chewable tablet Chew 81 mg by mouth 2 (two) times daily.  Marland Kitchen  azelastine (ASTELIN) 0.1 % nasal spray Place 1 spray into both nostrils 2 (two) times daily. Use in each nostril as directed  . Carboxymethylcellulose Sodium (REFRESH LIQUIGEL) 1 % GEL Place 1 drop into both eyes 2 (two) times daily.   Marland Kitchen CINNAMON PO Take 1,000 capsules by mouth daily as needed (for blood sugars).   . clopidogrel (PLAVIX) 75 MG tablet TAKE ONE TABLET BY MOUTH DAILY (PLEASE  USE AUROBINDO BRAND, PT HD FOR ALLERGY TO [place]). Hold for 5 days  . cyproheptadine (PERIACTIN) 4 MG tablet Take 2 mg by mouth daily as needed for allergies.  Marland Kitchen escitalopram (LEXAPRO) 10 MG tablet TAKE 1 TABLET(10 MG) BY MOUTH DAILY  . hydrochlorothiazide (MICROZIDE) 12.5 MG capsule TAKE 1 CAPSULE BY MOUTH EVERY DAY  . JANUVIA 50 MG tablet TAKE ONE TABLET BY MOUTH DAILY (Patient taking differently: Take 50 mg by mouth every morning. )  . metFORMIN (GLUCOPHAGE) 1000 MG tablet Take 1 tablet (1,000 mg total) by mouth daily with breakfast.  . metoCLOPramide (REGLAN) 10 MG tablet TAKE ONE TABLET BY MOUTH 1 TIME DAILY AS NEEDED  . montelukast (SINGULAIR) 5 MG chewable tablet CHEW TWO TABLETS BY MOUTH AT BEDTIME  . pantoprazole (PROTONIX) 40 MG tablet Take 1 tablet (40 mg total) by mouth 2 (two) times daily.  . Vitamin D, Ergocalciferol, (DRISDOL) 50000 units CAPS capsule TAKE 1 CAPSULE BY MOUTH EVERY SEVEN DAYS (Patient taking differently: Take 50,000 Units by mouth every Wednesday. )  . [DISCONTINUED] pantoprazole (PROTONIX) 40 MG tablet Take 1 tablet (40 mg total) by mouth 2 (two) times daily.  . [DISCONTINUED] ramipril (ALTACE) 10 MG capsule Take 10 mg by mouth daily.     No facility-administered encounter medications on [name] as of 11/10/2018.      Review of Systems  Constitutional: Negative.   HENT: Negative.   Eyes: Negative.   Respiratory: Negative.   Cardiovascular: Negative.   Gastrointestinal: Negative.   Endocrine: Negative.   Genitourinary: Negative.   Musculoskeletal: Negative.   Skin: Negative.   Allergic/Immunologic: Negative.   Neurological: Negative.   Hematological: Negative.   Psychiatric/Behavioral: Negative.        Objective:   Physical Exam Vitals signs and nursing note reviewed.  Constitutional:      Appearance: Normal appearance. She is well-developed. She is obese.     Comments: Patient is pleasant and alert and somewhat frustrated with a recent admission to the  hospital.  HENT:     Head: Normocephalic and atraumatic.     Right Ear: Tympanic membrane, ear canal and external ear normal. There is no impacted cerumen.     Left Ear: Tympanic membrane, ear canal and external ear normal. There is no impacted cerumen.     Nose: Nose normal. No congestion.     Mouth/Throat:     Mouth: Mucous membranes are moist.     Pharynx: Oropharynx is clear. No oropharyngeal exudate.     Comments: New upper denture in place Eyes:     General: No scleral icterus.       Right eye: No discharge.        Left eye: No discharge.     Extraocular Movements: Extraocular movements intact.     Conjunctiva/sclera: Conjunctivae normal.     Pupils: Pupils are equal, round, and reactive to light.     Comments: Patient will follow-up with ophthalmology as planned  Neck:     Musculoskeletal: Normal range of motion and neck supple.     Thyroid: No  thyromegaly.     Vascular: No JVD.     Comments: No bruits thyromegaly or anterior cervical adenopathy Cardiovascular:     Rate and Rhythm: Normal rate and regular rhythm.     Pulses: Normal pulses.     Heart sounds: Normal heart sounds. No murmur.     Comments: The heart is regular at 72/min with good pedal pulses and no edema Pulmonary:     Effort: Pulmonary effort is normal. No respiratory distress.     Breath sounds: Normal breath sounds. No wheezing or rales.     Comments: Clear anteriorly and posteriorly.  Somewhat tight cough. Abdominal:     General: Bowel sounds are normal.     Palpations: Abdomen is soft. There is no mass.     Tenderness: There is no abdominal tenderness. There is no guarding or rebound.     Comments: Obese without masses tenderness organ enlargement or bruits  Musculoskeletal: Normal range of motion.        General: No tenderness.     Right lower leg: No edema.     Left lower leg: No edema.  Lymphadenopathy:     Cervical: No cervical adenopathy.  Skin:    General: Skin is warm and dry.      Comments: Dry callused skin especially right heel area compared to left.  Neurological:     General: No focal deficit present.     Mental Status: She is alert and oriented to person, place, and time. Mental status is at baseline.     Cranial Nerves: No cranial nerve deficit.     Motor: No weakness.     Gait: Gait normal.     Deep Tendon Reflexes: Reflexes are normal and symmetric. Reflexes normal.  Psychiatric:        Mood and Affect: Mood normal.        Behavior: Behavior normal.        Thought Content: Thought content normal.        Judgment: Judgment normal.     Comments: Mood affect and behavior are normal for this patient     BP (!) 144/77 (BP Location: Left Arm)   Pulse 71   Temp 98.2 F (36.8 C) (Oral)   Ht '5\' 2"'  (1.575 m)   Wt 180 lb (81.6 kg)   BMI 32.92 kg/m        Assessment & Plan:  1. Essential hypertension -The blood pressure is slightly elevated today but the patient was somewhat stressed and frustrated with her recent hospitalization - BMP8+EGFR - CBC with Differential/Platelet - Hepatic function panel - DG Chest 2 View; Future  2. Type 2 diabetes mellitus with peripheral neuropathy (HCC) -Continue monitoring blood sugars closely - BMP8+EGFR - CBC with Differential/Platelet - Bayer DCA Hb A1c Waived  3. Other hyperlipidemia -Continue aggressive therapeutic lifestyle changes to keep cholesterol under the best control possible - CBC with Differential/Platelet - Lipid panel - DG Chest 2 View; Future  4. Gastroesophageal reflux disease, esophagitis presence not specified -Continue with regular use of pantoprazole - CBC with Differential/Platelet  5. Vitamin D deficiency -Continue with vitamin D replacement pending results of lab work - CBC with Differential/Platelet - VITAMIN D 25 Hydroxy (Vit-D Deficiency, Fractures)  6. Thyroid disease -Continue with thyroid replacement pending results of lab work - CBC with Differential/Platelet  7. ASCVD  (arteriosclerotic cardiovascular disease) -No chest pain or pressure symptoms today. - CBC with Differential/Platelet - Lipid panel - DG Chest 2 View; Future  8.  Type 2 diabetes mellitus with diabetic neuropathy, without long-term current use of insulin (HCC) -Add gabapentin 100 mg at bedtime as needed with refills.  Meds ordered this encounter  Medications  . pantoprazole (PROTONIX) 40 MG tablet    Sig: Take 1 tablet (40 mg total) by mouth 2 (two) times daily.    Dispense:  90 tablet    Refill:  3   Patient Instructions                       Medicare Annual Wellness Visit  [facility] and the medical providers at [facility] strive to bring you the best medical care.  In doing so we not only want to address your current medical conditions and concerns but also to detect new conditions early and prevent illness, disease and health-related problems.    Medicare offers a yearly Wellness Visit which allows our clinical staff to assess your need for preventative services including immunizations, lifestyle education, counseling to decrease risk of preventable diseases and screening for fall risk and other medical concerns.    This visit is provided free of charge (no copay) for all Medicare recipients. The clinical pharmacists at [facility] have begun to conduct these Wellness Visits which will also include a thorough review of all your medications.    As you primary medical provider recommend that you make an appointment for your Annual Wellness Visit if you have not done so already this year.  You may set up this appointment before you leave today or you may call back ([phone]) and schedule an appointment.  Please make sure when you call that you mention that you are scheduling your Annual Wellness Visit with the clinical pharmacist so that the appointment may be made for the proper length of time.     Continue current medications. Continue  good therapeutic lifestyle changes which include good diet and exercise. Fall precautions discussed with patient. If an FOBT was given today- please return it to our front desk. If you are over 48 years old - you may need Prevnar 69 or the adult Pneumonia vaccine.  **Flu shots are available--- please call and schedule a FLU-CLINIC appointment**  After your visit with [place] today you will receive a survey in the mail or online from [employer] regarding your care with [place]. Please take a moment to fill this out. Your feedback is very important to [place] as you can help [place] better understand your patient needs as well as improve your experience and satisfaction. WE CARE ABOUT YOU!!!   Follow-up with gastroenterology as planned Follow-up with ophthalmology as planned Continue to monitor blood sugars and blood pressures at home watching sodium intake closely. We will make sure that your gastroenterologist gets a copy of the lab work that is being done today.  Arrie Senate MD

## 2018-11-11 LAB — HEPATIC FUNCTION PANEL
ALT: 66 IU/L — ABNORMAL HIGH (ref 0–32)
AST: 69 IU/L — ABNORMAL HIGH (ref 0–40)
Albumin: 4.2 g/dL (ref 3.7–4.7)
Alkaline Phosphatase: 142 IU/L — ABNORMAL HIGH (ref 39–117)
Bilirubin Total: 0.4 mg/dL (ref 0.0–1.2)
Bilirubin, Direct: 0.15 mg/dL (ref 0.00–0.40)
Total Protein: 6.9 g/dL (ref 6.0–8.5)

## 2018-11-11 LAB — CBC WITH DIFFERENTIAL/PLATELET
Basophils Absolute: 0.1 10*3/uL (ref 0.0–0.2)
Basos: 1 %
EOS (ABSOLUTE): 0.1 10*3/uL (ref 0.0–0.4)
Eos: 2 %
Hematocrit: 40.6 % (ref 34.0–46.6)
Hemoglobin: 12.4 g/dL (ref 11.1–15.9)
Immature Grans (Abs): 0 10*3/uL (ref 0.0–0.1)
Immature Granulocytes: 0 %
Lymphocytes Absolute: 1.3 10*3/uL (ref 0.7–3.1)
Lymphs: 19 %
MCH: 24.9 pg — ABNORMAL LOW (ref 26.6–33.0)
MCHC: 30.5 g/dL — ABNORMAL LOW (ref 31.5–35.7)
MCV: 82 fL (ref 79–97)
Monocytes Absolute: 0.4 10*3/uL (ref 0.1–0.9)
Monocytes: 6 %
Neutrophils Absolute: 4.7 10*3/uL (ref 1.4–7.0)
Neutrophils: 72 %
Platelets: 275 10*3/uL (ref 150–450)
RBC: 4.98 x10E6/uL (ref 3.77–5.28)
RDW: 17 % — ABNORMAL HIGH (ref 11.7–15.4)
WBC: 6.6 10*3/uL (ref 3.4–10.8)

## 2018-11-11 LAB — LIPID PANEL
Chol/HDL Ratio: 6.6 ratio — ABNORMAL HIGH (ref 0.0–4.4)
Cholesterol, Total: 237 mg/dL — ABNORMAL HIGH (ref 100–199)
HDL: 36 mg/dL — ABNORMAL LOW (ref >39)
LDL Calculated: 166 mg/dL — ABNORMAL HIGH (ref 0–99)
Triglycerides: 173 mg/dL — ABNORMAL HIGH (ref 0–149)
VLDL Cholesterol Cal: 35 mg/dL (ref 5–40)

## 2018-11-11 LAB — BMP8+EGFR
BUN/Creatinine Ratio: 14 (ref 12–28)
BUN: 10 mg/dL (ref 8–27)
CO2: 27 mmol/L (ref 20–29)
Calcium: 10.5 mg/dL — ABNORMAL HIGH (ref 8.7–10.3)
Chloride: 95 mmol/L — ABNORMAL LOW (ref 96–106)
Creatinine, Ser: 0.72 mg/dL (ref 0.57–1.00)
GFR calc Af Amer: 95 mL/min/{1.73_m2} (ref >59)
GFR calc non Af Amer: 82 mL/min/{1.73_m2} (ref >59)
Glucose: 394 mg/dL — ABNORMAL HIGH (ref 65–99)
Potassium: 4.1 mmol/L (ref 3.5–5.2)
Sodium: 137 mmol/L (ref 134–144)

## 2018-11-11 LAB — VITAMIN D 25 HYDROXY (VIT D DEFICIENCY, FRACTURES): Vit D, 25-Hydroxy: 47.5 ng/mL (ref 30.0–100.0)

## 2018-12-01 ENCOUNTER — Ambulatory Visit: Payer: PPO | Admitting: Pharmacist Clinician (PhC)/ Clinical Pharmacy Specialist

## 2018-12-15 ENCOUNTER — Telehealth: Payer: Self-pay | Admitting: Family Medicine

## 2018-12-15 ENCOUNTER — Other Ambulatory Visit: Payer: Self-pay | Admitting: Family Medicine

## 2018-12-15 MED ORDER — VITAMIN D (ERGOCALCIFEROL) 1.25 MG (50000 UNIT) PO CAPS: ORAL_CAPSULE | ORAL | 3 refills | Status: DC

## 2018-12-15 MED ORDER — MONTELUKAST SODIUM 5 MG PO CHEW: CHEWABLE_TABLET | ORAL | 2 refills | Status: DC

## 2018-12-15 NOTE — Telephone Encounter (Signed)
Patient is switching to [facility]. Requested that Vit D and Singular be sent in which I did. Will call back tomorrow with what other meds she needs.

## 2018-12-16 ENCOUNTER — Telehealth: Payer: Self-pay | Admitting: *Deleted

## 2018-12-16 MED ORDER — CEFDINIR 300 MG PO CAPS: 300.0000 mg | ORAL_CAPSULE | Freq: Two times a day (BID) | ORAL | 0 refills | Status: DC

## 2018-12-16 NOTE — Telephone Encounter (Signed)
Per Dr Laurance Flatten, Elizabeth [name] RX sent in due to pt's allergies Pt has taken in the past with no problem Pt notified

## 2018-12-16 NOTE — Telephone Encounter (Signed)
Incoming call from pt Pt has sinus pressure, ear pain, drainage and headache x 2 days Denies fever Requesting antibiotic Please send to [facility]

## 2018-12-16 NOTE — Telephone Encounter (Signed)
Please do Augmentin 875, #20, 1 twice daily with food until completed.  Also use nasal saline frequently in each nostril and if she is taking Flonase make sure she is using this regularly.  Drink plenty of fluids and stay well-hydrated.

## 2018-12-21 ENCOUNTER — Encounter (INDEPENDENT_AMBULATORY_CARE_PROVIDER_SITE_OTHER): Payer: Self-pay | Admitting: Internal Medicine

## 2018-12-21 ENCOUNTER — Ambulatory Visit (INDEPENDENT_AMBULATORY_CARE_PROVIDER_SITE_OTHER): Payer: PPO | Admitting: Internal Medicine

## 2018-12-21 ENCOUNTER — Other Ambulatory Visit: Payer: Self-pay

## 2018-12-21 DIAGNOSIS — R945 Abnormal results of liver function studies: Secondary | ICD-10-CM

## 2018-12-21 DIAGNOSIS — Z8719 Personal history of other diseases of the digestive system: Secondary | ICD-10-CM | POA: Diagnosis not present

## 2018-12-21 DIAGNOSIS — R7989 Other specified abnormal findings of blood chemistry: Secondary | ICD-10-CM

## 2018-12-21 NOTE — Patient Instructions (Signed)
Blood work and ultrasound with elastography to be scheduled in 1 month from now.

## 2018-12-21 NOTE — Progress Notes (Addendum)
Virtual Visit via Telephone Note  I connected with Elizabeth [name] on 12/21/18 at 11:30 AM EDT by telephone and verified that I am speaking with the correct person using two identifiers.   I discussed the limitations, risks, security and privacy concerns of performing an evaluation and management service by telephone and the availability of in person appointments. I also discussed with the patient that there may be a patient responsible charge related to this service. The patient expressed understanding and agreed to proceed telephone visit.  Elizabeth [name] is at home and I am in the office. Thomas Hoff, LPN was also present for telephone visit.   Office visit was requested by Dr. Morrie Sheldon because of elevated transaminases trending upwards.  Because of COVID-19 pandemic face-to-face visit was not possible and patient agreed to have telephone visit.  She does not have video set up at this time.   History of Present Illness:  Patient is 78 year old Caucasian female who is well-known to me from evaluation back in December 2019 when she was hospitalized with GI bleed requiring transfusion.  No bleeding lesion was found on EGD and colonoscopy.  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  It was recommended that she drop aspirin dose from full dose to either 1 or 2 baby aspirins daily in addition to her clopidogrel.  Patient has not experienced any more GI bleed. Patient had blood work by Dr. Morrie Sheldon on 11/10/2018.  Her hemoglobin was normal at 12.4.  However her transaminases. Patient denies abdominal pain or pruritus.  She states she first had elevated transaminases when she was on a statin.  Since then she has been tried on on different statins but they all had to be discontinued because of bump in her transaminases.  She has not taken a statin in over a year.  There is no history of jaundice or hepatitis in the past.   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  Family history is also negative for chronic liver disease. Patient states she has been diabetic for 35 years.   Observations/Objective:  LFTs from 11/10/2018 Bilirubin 0.4, AP 142, AST 69, ALT 66 and an albumin 4.2.  AST and ALT was respectively 52 and 41 on 01/16/2018 AST and ALT were 39 and 46 respectively on 02/23/2018. AST and ALT were 61 and 71 respectively on 03/24/2012. AST and ALT were 20 and 18 respectively on 10/28/2011. AST and ALT were 21 and 20 respectively on 08/29/2008.   Assessment and Plan:  #1.  Mildly elevated transaminases.  These dated back to July 2013.  Initially transaminitis was felt to be due to statin use but transaminases have never returned to normal even though she has been off statin for several months.  She most likely has fatty liver given longstanding diabetes but other conditions need to be ruled out.  Nothing to suggest cirrhosis based on evaluation of December 2019.  #2.  History of small bowel GI bleed requiring blood transfusion in December 2019.  She was felt to either have NSAID and injury to small bowel or AV malformation based on small bowel given capsule study which revealed blood in her small bowel.  She has not bled anymore since aspirin dose was reduced.   Follow Up Instructions:   Patient reassured that she does not have advanced liver disease. She will have the  following studies in about 2 months. Hepatitis B surface antigen, hepatitis B surface antibody, hepatitis C virus antibody and antimitochondrial antibody. Abdominal ultrasound with elastography. I will be contacting patient when the studies are completed and we will plan to see her back in the office in 6 months.  I discussed the assessment and treatment plan with the patient. The patient was provided an opportunity to ask questions and all were answered. The patient agreed with the plan  and demonstrated an understanding of the instructions.   The patient was advised to call back or seek an in-person evaluation if the symptoms worsen or if the condition fails to improve as anticipated.  I provided 12  minutes of non-face-to-face time during this encounter.   Hildred Laser, MD

## 2018-12-30 ENCOUNTER — Ambulatory Visit (INDEPENDENT_AMBULATORY_CARE_PROVIDER_SITE_OTHER): Payer: PPO | Admitting: Nurse Practitioner

## 2018-12-30 ENCOUNTER — Other Ambulatory Visit: Payer: Self-pay

## 2018-12-30 ENCOUNTER — Encounter: Payer: Self-pay | Admitting: Nurse Practitioner

## 2018-12-30 DIAGNOSIS — F5101 Primary insomnia: Secondary | ICD-10-CM | POA: Diagnosis not present

## 2018-12-30 DIAGNOSIS — R51 Headache: Secondary | ICD-10-CM | POA: Diagnosis not present

## 2018-12-30 DIAGNOSIS — G8929 Other chronic pain: Secondary | ICD-10-CM | POA: Diagnosis not present

## 2018-12-30 DIAGNOSIS — M545 Low back pain: Secondary | ICD-10-CM

## 2018-12-30 DIAGNOSIS — R519 Headache, unspecified: Secondary | ICD-10-CM

## 2018-12-30 MED ORDER — TRAMADOL HCL 50 MG PO TABS: 50.0000 mg | ORAL_TABLET | Freq: Four times a day (QID) | ORAL | 0 refills | Status: AC | PRN

## 2018-12-30 NOTE — Progress Notes (Signed)
Patient ID: Elizabeth [name], female   DOB: [birth year], 78 y.o.   MRN: [id number]     Virtual Visit via telephone Note  I connected with Elizabeth [name] on 12/30/18 at 2:15 PM by telephone and verified that I am speaking with the correct person using two identifiers. Elizabeth [name] is currently located at home and her husband is currently with her during visit. The provider, Nyaja-Margaret Hassell Done, FNP is located in their office at time of visit.  I discussed the limitations, risks, security and privacy concerns of performing an evaluation and management service by telephone and the availability of in person appointments. I also discussed with the patient that there may be a patient responsible charge related to this service. The patient expressed understanding and agreed to proceed.   History and Present Illness:  Elizabeth [name] in today with chief complaint of insomnis, back pain and headache.  Patient calls in with  Several complaints:- - insomnia- patient says that she cannot sleep at night. She says that the weight of her hair makes her head hurt so headaches are keeping her up at night. She also says that her back is hurting. She has had back surgery in the past and now it is hurting again. Dr. Laurance Flatten has given her ultram in the past and that is all that helps her.  Rates back pain 7/10. Laying down increases pain. Nothing really helps make it better.      Review of Systems  Constitutional: Negative.  Negative for diaphoresis and weight loss.  HENT: Negative.   Eyes: Negative for blurred vision, double vision and pain.  Respiratory: Negative.  Negative for shortness of breath.   Cardiovascular: Negative.  Negative for chest pain, palpitations, orthopnea and leg swelling.  Gastrointestinal: Negative for abdominal pain.  Musculoskeletal: Positive for back pain.  Skin: Negative for rash.  Neurological: Positive for headaches. Negative for dizziness, sensory change, loss of consciousness and  weakness.  Endo/Heme/Allergies: Negative for polydipsia. Does not bruise/bleed easily.  Psychiatric/Behavioral: Negative for memory loss. The patient has insomnia.   All other systems reviewed and are negative.    Observations/Objective: Alert and oriented- answers all questions appropriately No distress in voice  Assessment and Plan: Elizabeth [name] in today with chief complaint of Insomnia   1. Chronic midline low back pain without sciatica  2. Acute nonintractable headache, unspecified headache type  3. Primary insomnia Meds ordered this encounter  Medications  . traMADol (ULTRAM) 50 MG tablet    Sig: Take 1 tablet (50 mg total) by mouth every 6 (six) hours as needed for up to 5 days.    Dispense:  20 tablet    Refill:  0    Order Specific Question:   Supervising Provider    Answer:   Caryl Pina A [[id number]]   Back stretches  moist heat No heavy lifting   Follow Up Instructions: With Dr. Laurance Flatten    I discussed the assessment and treatment plan with the patient. The patient was provided an opportunity to ask questions and all were answered. The patient agreed with the plan and demonstrated an understanding of the instructions.   The patient was advised to call back or seek an in-person evaluation if the symptoms worsen or if the condition fails to improve as anticipated.  The above assessment and management plan was discussed with the patient. The patient verbalized understanding of and has agreed to the management plan. Patient is aware to call the clinic if symptoms  persist or worsen. Patient is aware when to return to the clinic for a follow-up visit. Patient educated on when it is appropriate to go to the emergency department.    I provided 15 minutes of non-face-to-face time during this encounter.    Trinaty-Margaret Hassell Done, FNP

## 2019-01-12 ENCOUNTER — Ambulatory Visit (HOSPITAL_COMMUNITY): Payer: PPO

## 2019-01-12 ENCOUNTER — Ambulatory Visit (HOSPITAL_COMMUNITY)
Admission: RE | Admit: 2019-01-12 | Discharge: 2019-01-12 | Disposition: A | Discharge status: Home / Self Care | Payer: PPO | Source: Ambulatory Visit | Attending: Interventional Radiology | Admitting: Interventional Radiology

## 2019-01-12 ENCOUNTER — Other Ambulatory Visit: Payer: Self-pay

## 2019-01-12 ENCOUNTER — Telehealth (HOSPITAL_COMMUNITY): Payer: Self-pay

## 2019-01-12 DIAGNOSIS — I729 Aneurysm of unspecified site: Secondary | ICD-10-CM

## 2019-01-12 DIAGNOSIS — I639 Cerebral infarction, unspecified: Secondary | ICD-10-CM

## 2019-01-12 DIAGNOSIS — I771 Stricture of artery: Secondary | ICD-10-CM | POA: Insufficient documentation

## 2019-01-12 DIAGNOSIS — I6609 Occlusion and stenosis of unspecified middle cerebral artery: Secondary | ICD-10-CM | POA: Diagnosis not present

## 2019-01-12 LAB — CREATININE, SERUM
Creatinine, Ser: 0.82 mg/dL (ref 0.44–1.00)
GFR calc Af Amer: >60 mL/min (ref >60)
GFR calc non Af Amer: >60 mL/min (ref >60)

## 2019-01-12 MED ORDER — GADOBUTROL 1 MMOL/ML IV SOLN
8.0000 mL | Freq: Once | INTRAVENOUS | Status: AC | PRN
  Administered 2019-01-12: 14:00:00 8 mL via INTRAVENOUS

## 2019-01-12 NOTE — Telephone Encounter (Signed)
Left message for pt to f/u in 6 months. Call if she has any questions. AW

## 2019-01-19 ENCOUNTER — Other Ambulatory Visit (INDEPENDENT_AMBULATORY_CARE_PROVIDER_SITE_OTHER): Payer: Self-pay | Admitting: *Deleted

## 2019-01-19 DIAGNOSIS — R7989 Other specified abnormal findings of blood chemistry: Secondary | ICD-10-CM

## 2019-01-20 DIAGNOSIS — R945 Abnormal results of liver function studies: Secondary | ICD-10-CM | POA: Diagnosis not present

## 2019-01-24 LAB — HEPATITIS C ANTIBODY
Hepatitis C Ab: NONREACTIVE
SIGNAL TO CUT-OFF: 0.01 (ref <1.00)

## 2019-01-24 LAB — HEPATIC FUNCTION PANEL
AG Ratio: 1.4 (calc) (ref 1.0–2.5)
ALT: 55 U/L — ABNORMAL HIGH (ref 6–29)
AST: 57 U/L — ABNORMAL HIGH (ref 10–35)
Albumin: 4.3 g/dL (ref 3.6–5.1)
Alkaline phosphatase (APISO): 119 U/L (ref 37–153)
Bilirubin, Direct: 0.3 mg/dL — ABNORMAL HIGH (ref 0.0–0.2)
Globulin: 3.1 g/dL (calc) (ref 1.9–3.7)
Indirect Bilirubin: 0.6 mg/dL (calc) (ref 0.2–1.2)
Total Bilirubin: 0.9 mg/dL (ref 0.2–1.2)
Total Protein: 7.4 g/dL (ref 6.1–8.1)

## 2019-01-24 LAB — HEPATITIS B SURFACE ANTIGEN: Hepatitis B Surface Ag: NONREACTIVE

## 2019-01-24 LAB — MITOCHONDRIAL ANTIBODIES: Mitochondrial M2 Ab, IgG: 114 U — ABNORMAL HIGH

## 2019-01-24 LAB — HEPATITIS B SURFACE ANTIBODY,QUALITATIVE: Hep B S Ab: REACTIVE — AB

## 2019-02-03 ENCOUNTER — Other Ambulatory Visit: Payer: Self-pay

## 2019-02-03 ENCOUNTER — Ambulatory Visit (INDEPENDENT_AMBULATORY_CARE_PROVIDER_SITE_OTHER): Payer: PPO | Admitting: *Deleted

## 2019-02-03 VITALS — BP 134/80 | HR 80 | Ht 62.0 in | Wt 180.0 lb

## 2019-02-03 DIAGNOSIS — Z0000 Encounter for general adult medical examination without abnormal findings: Secondary | ICD-10-CM | POA: Diagnosis not present

## 2019-02-03 NOTE — Progress Notes (Signed)
MEDICARE ANNUAL WELLNESS VISIT  02/03/2019  Telephone Visit Disclaimer This Medicare AWV was conducted by telephone due to national recommendations for restrictions regarding the COVID-19 Pandemic (e.g. social distancing).  I verified, using two identifiers, that I am speaking with Elizabeth [name] or their authorized healthcare agent. I discussed the limitations, risks, security, and privacy concerns of performing an evaluation and management service by telephone and the potential availability of an in-person appointment in the future. The patient expressed understanding and agreed to proceed.   Subjective:  Elizabeth [name] is a 78 y.o. female patient of Chipper Herb, MD who had a Medicare Annual Wellness Visit today via telephone. Elizabeth [name] is Retired and lives with their spouse. she has no children. she reports that she is socially active and does interact with friends/family regularly. she is minimally physically active and enjoys reading.  Patient Care Team: Chipper Herb, MD as PCP - General (Family Medicine) Particia Nearing, [place] (Optometry) Rogene Houston, MD as Consulting Physician (Gastroenterology) Luanne Bras, MD as Consulting Physician (Interventional Radiology)  Advanced Directives 02/03/2019 08/17/2018 08/17/2018 08/17/2018 08/09/2018 05/12/2018 05/11/2018  Does Patient Have a Medical Advance Directive? Yes Yes - Yes Yes Yes Yes  Type of Advance Directive [place];Living will Healthcare Power of [place] of [place] of [place];Living will [place];Living will Living will;Healthcare Power of Attorney  Does patient want to make changes to medical advance directive? - No - Patient declined - - - No - Patient declined No - Patient declined  Copy of [place] in Chart? - - No - copy requested - No - copy requested No - copy requested -  Would patient like  information on creating a medical advance directive? No - Patient declined - - - - - -  Pre-existing out of facility DNR order (yellow form or pink MOST form) - - - - - - -    Hospital Utilization Over the Past 12 Months: # of hospitalizations or ER visits: 0 # of surgeries: 0  Review of Systems    Patient reports that her overall health is unchanged compared to last year.  Patient Reported Readings (BP, Pulse, CBG, Weight, etc) BP 134/80 Comment: home reading  Pulse 80   Ht 5\' 2"  (1.575 m)   Wt 180 lb (81.6 kg)   BMI 32.92 kg/m    Review of Systems: General ROS: negative  All other systems negative.  Pain Assessment       Current Medications & Allergies (verified) Allergies as of 02/03/2019      Reactions   Banana Anaphylaxis   Codeine Nausea And Vomiting, Other (See Comments)   PROJECTILE VOMITING   Stevia [stevioside] Swelling, Other (See Comments)   Face, tongue, and eye swelling    Actos [pioglitazone] Swelling   Benicar [olmesartan Medoxomil] Other (See Comments)   Feel bad   Chantix [varenicline] Other (See Comments)   "Worked opposite."   Diovan [valsartan] Other (See Comments)   Feel bad   Lipitor [atorvastatin] Other (See Comments)   REACTION:  Joint pain   Spinach Other (See Comments)   Stomach problems   Vicodin [hydrocodone-acetaminophen] Nausea And Vomiting   Zocor [simvastatin] Other (See Comments)   REACTION:  "made liver function incorrectly"   Ace Inhibitors Cough   Celebrex [celecoxib] Rash   Latex Rash, Other (See Comments)   REACTION: red rash   Metformin And Related  GI - upset    Penicillins Rash, Other (See Comments)   Has patient had a PCN reaction causing immediate rash, facial/tongue/throat swelling, SOB or lightheadedness with hypotension: No Has patient had a PCN reaction causing severe rash involving mucus membranes or skin necrosis: No Has patient had a PCN reaction that required hospitalization: No- MD office Has patient  had a PCN reaction occurring within the last 10 years: No If all of the above answers are "NO", then may proceed with Cephalosporin use.   Sulfonamide Derivatives Rash      Medication List       Accurate as of Feb 03, 2019 10:38 AM. If you have any questions, ask your nurse or doctor.        STOP taking these medications   cefdinir 300 MG capsule Commonly known as:  OMNICEF     TAKE these medications   albuterol 108 (90 Base) MCG/ACT inhaler Commonly known as:  ProAir HFA INHALE 2 PUFFS EVERY SIX HOURS AS NEEDED FOR WHEEZING OR SHORTNESS OF BREATH What changed:    how much to take  how to take this  when to take this  reasons to take this  additional instructions   amLODipine 10 MG tablet Commonly known as:  NORVASC TAKE 1 TABLET(10 MG) BY MOUTH DAILY   aspirin 325 MG tablet Take 325 mg by mouth daily. What changed:  Another medication with the same name was removed. Continue taking this medication, and follow the directions you see here.   azelastine 0.1 % nasal spray Commonly known as:  ASTELIN Place 1 spray into both nostrils 2 (two) times daily. Use in each nostril as directed   CINNAMON PO Take 1,000 mg by mouth daily as needed (for blood sugars).   clopidogrel 75 MG tablet Commonly known as:  PLAVIX TAKE ONE TABLET BY MOUTH DAILY (PLEASE USE AUROBINDO BRAND, PT HD FOR ALLERGY TO [place]DY). Hold for 5 days   cyproheptadine 4 MG tablet Commonly known as:  PERIACTIN Take 2 mg by mouth daily as needed for allergies.   gabapentin 100 MG capsule Commonly known as:  NEURONTIN Take 1 capsule (100 mg total) by mouth at bedtime.   hydrochlorothiazide 12.5 MG capsule Commonly known as:  MICROZIDE TAKE 1 CAPSULE BY MOUTH EVERY DAY   Januvia 50 MG tablet Generic drug:  sitaGLIPtin TAKE ONE TABLET BY MOUTH DAILY What changed:  how much to take   metFORMIN 1000 MG tablet Commonly known as:  GLUCOPHAGE Take 1 tablet (1,000 mg total) by mouth daily with  breakfast.   metoCLOPramide 10 MG tablet Commonly known as:  REGLAN TAKE ONE TABLET BY MOUTH 1 TIME DAILY AS NEEDED   montelukast 5 MG chewable tablet Commonly known as:  SINGULAIR CHEW TWO TABLETS BY MOUTH AT BEDTIME   pantoprazole 40 MG tablet Commonly known as:  PROTONIX Take 1 tablet (40 mg total) by mouth 2 (two) times daily.   pediatric multivitamin-iron 15 MG chewable tablet Chew 1 tablet by mouth daily.   Refresh Liquigel 1 % Gel Generic drug:  Carboxymethylcellulose Sodium Place 1 drop into both eyes 2 (two) times daily.   traMADol 50 MG tablet Commonly known as:  ULTRAM Take 50 mg by mouth daily as needed. Usually 1/2 tab - one a week   Vitamin D (Ergocalciferol) 1.25 MG (50000 UT) Caps capsule Commonly known as:  DRISDOL TAKE 1 CAPSULE BY MOUTH EVERY SEVEN DAYS       History (reviewed): Past Medical History:  Diagnosis  Date  . Absolute anemia 07/28/2018  . Active smoker   . Allergy   . Anxiety    Takes Xanax for anxiety  . Arthritis   . Asthma   . Cataract   . Chronic airway obstruction, not elsewhere classified   . Clotting disorder ([place])    stoke   . Depression   . Diarrhea   . Diverticulosis of colon (without mention of hemorrhage)   . Esophageal reflux   . Family history of malignant neoplasm of gastrointestinal tract   . GI bleed   . Headache(784.0)    Irregular  . Neuromuscular disorder (H[facility])    numbness is left hand and left elbow  . Obesity, unspecified   . Other and unspecified hyperlipidemia   . Personal history of colonic polyps   . Stricture and stenosis of esophagus   . Stroke ([place])   . Thyroid disease   . Type II or unspecified type diabetes mellitus without mention of complication, not stated as uncontrolled   . Unspecified asthma(493.90)   . Unspecified essential hypertension    Past Surgical History:  Procedure Laterality Date  . APPENDECTOMY    . BACK SURGERY     Spinal surgery  . BRAIN SURGERY     2011/12/12 - stoke (  fall in 12-11-2008)   . CARDIAC CATHETERIZATION  11-Dec-2004   LAD: 30%, RCA : 20%, normal EF, elevated LVEDP  . CARDIAC CATHETERIZATION    . CEREBRAL ANGIOGRAM  12/20/2015  . COLONOSCOPY N/A 08/09/2018   Procedure: COLONOSCOPY;  Surgeon: Rogene Houston, MD;  [facility];  Service: Endoscopy;  Laterality: N/A;  1:55  . ESOPHAGEAL DILATION N/A 08/09/2018   Procedure: ESOPHAGEAL DILATION;  Surgeon: Rogene Houston, MD;  [facility];  Service: Endoscopy;  Laterality: N/A;  . ESOPHAGOGASTRODUODENOSCOPY N/A 08/09/2018   Procedure: ESOPHAGOGASTRODUODENOSCOPY (EGD);  Surgeon: Rogene Houston, MD;  [facility];  Service: Endoscopy;  Laterality: N/A;  . GIVENS CAPSULE STUDY N/A 08/25/2018   Procedure: GIVENS CAPSULE STUDY;  Surgeon: Rogene Houston, MD;  [facility];  Service: Endoscopy;  Laterality: N/A;  . IR ANGIO INTRA EXTRACRAN SEL COM CAROTID INNOMINATE BILAT MOD SED  01/14/2018  . IR ANGIO INTRA EXTRACRAN SEL COM CAROTID INNOMINATE BILAT MOD SED  05/11/2018  . IR ANGIO VERTEBRAL SEL SUBCLAVIAN INNOMINATE UNI L MOD SED  01/14/2018  . IR ANGIO VERTEBRAL SEL SUBCLAVIAN INNOMINATE UNI L MOD SED  05/11/2018  . IR ANGIO VERTEBRAL SEL VERTEBRAL UNI R MOD SED  01/14/2018  . IR ANGIO VERTEBRAL SEL VERTEBRAL UNI R MOD SED  05/11/2018  . KNEE ARTHROSCOPY     left  . LUMBAR DISC SURGERY    . POLYPECTOMY  08/09/2018   Procedure: POLYPECTOMY;  Surgeon: Rogene Houston, MD;  [facility];  Service: Endoscopy;;  ascending colon (CS x 2, HS x5) hepatic flexure (HSx1), recto-sigmoid (HSx1)  . TONSILLECTOMY    . TOTAL ABDOMINAL HYSTERECTOMY     menopause/bleeding   Family History  Problem Relation Age of Onset  . Diabetes Mother   . Heart disease Mother   . Asthma Mother   . Kidney disease Mother   . Drug abuse Mother   . Hypertension Mother   . Stroke Mother        fall -several bleed on brain December 11, 1988  . Early death Father        trauma  . Colon cancer Other  first cousin, paternal aunt and uncle  . Cancer Paternal Grandmother        gastric  . Diabetes Paternal Grandmother   . Mental illness Paternal Grandmother   . Heart disease Maternal Grandmother   . Thyroid cancer Other        aunt  . Heart disease Sister   . Mental illness Sister   . Heart attack Sister   . Heart disease Brother   . Lung disease Brother   . Cancer Sister        pancreatic  . Diabetes Sister   . Hypertension Sister   . Tuberculosis Maternal Grandfather   . Heart disease Paternal Grandfather    Social History   Socioeconomic History  . Marital status: Married    Spouse name: [name]  . Number of children: 0  . Years of education: 16  . Highest education level: Bachelor's degree (e.g., BA, AB, BS)  Occupational History  . 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 Financial resource strain: Not hard at all  . Food insecurity:    Worry: Never true    Inability: Never true  . Transportation needs:    Medical: No    Non-medical: No  Tobacco Use  . Smoking status: Current Every Day Smoker    Packs/day: 1.00    Years: 20.00    Pack years: 20.00    Types: Cigarettes    Start date: 01/15/1983  . Smokeless tobacco: Never Used  Substance and Sexual Activity  . Alcohol use: No    Alcohol/week: 0.0 standard drinks  . Drug use: No  . Sexual activity: Not Currently  Lifestyle  . Physical activity:    Days per week: 0 days    Minutes per session: 0 min  . Stress: To some extent  Relationships  . Social connections:    Talks on phone: Not on file    Gets together: Not on file    Attends religious service: Not on file    Active member of club or organization: Not on file    Attends meetings of clubs or organizations: Not on file    Relationship status: Not on file  Other Topics Concern  . Not on file  Social History Narrative   Married [name]   Limited activity due to back pain    Activities of Daily Living In  your present state of health, do you have any difficulty performing the following activities: 02/03/2019 08/17/2018  Hearing? N N  Vision? Y N  Difficulty concentrating or making decisions? N N  Walking or climbing stairs? N N  Dressing or bathing? N N  Doing errands, shopping? N N  Preparing Food and eating ? N -  Using the Toilet? N -  In the past six months, have you accidently leaked urine? N -  Do you have problems with loss of bowel control? N -  Managing your Medications? N -  Managing your Finances? N -  Housekeeping or managing your Housekeeping? N -  Some recent data might be hidden    Patient Literacy    Exercise Current Exercise Habits: Home exercise routine, Type of exercise: walking, Time (Minutes): 30, Frequency (Times/Week): 5, Weekly Exercise (Minutes/Week): 150, Intensity: Mild, Exercise limited by: None identified  Diet Patient reports consuming 2 meals a day and 1 snack(s) a day Patient reports that her primary diet is: Regular Patient reports that she does have regular access  to food.   Depression Screen PHQ 2/9 Scores 02/03/2019 11/10/2018 09/20/2018 09/06/2018 06/29/2018 05/21/2018 02/23/2018  PHQ - 2 Score 1 1 0 0 0 0 0  PHQ- 9 Score - - 0 0 - - -     Fall Risk Fall Risk  02/03/2019 11/10/2018 09/20/2018 09/06/2018 06/29/2018  Falls in the past year? 0 0 0 1 Yes  Number falls in past yr: - - - 0 1  Injury with Fall? - - - 0 Yes  Comment - - - - -     Objective:  Elizabeth [name] seemed alert and oriented and she participated appropriately during our telephone visit.  Blood Pressure Weight BMI  BP Readings from Last 3 Encounters:  02/03/19 134/80  11/10/18 (!) 155/72  09/20/18 134/70   Wt Readings from Last 3 Encounters:  02/03/19 180 lb (81.6 kg)  11/10/18 180 lb (81.6 kg)  09/20/18 183 lb (83 kg)   BMI Readings from Last 1 Encounters:  02/03/19 32.92 kg/m    *Unable to obtain current vital signs, weight, and BMI due to telephone visit type   Hearing/Vision  . Elizabeth [name] did  seem to have difficulty with hearing/understanding during the telephone conversation . Reports that she has had a formal eye exam by an eye care professional within the past year . Reports that she has not had a formal hearing evaluation within the past year *Unable to fully assess hearing and vision during telephone visit type  Cognitive Function: 6CIT Screen 02/03/2019  What Year? 0 points  What month? 0 points  What time? 0 points  Count back from 20 0 points  Months in reverse 0 points  Repeat phrase 0 points  Total Score 0    Normal Cognitive Function Screening: Yes (Normal:0-7, Significant for Dysfunction: >8)  Immunization & Health Maintenance Record Immunization History  Administered Date(s) Administered  . Influenza Whole 06/08/2010  . Influenza,inj,Quad PF,6+ Mos 07/07/2013, 07/06/2014, 07/17/2017  . Influenza-Unspecified 08/05/2016  . Pneumococcal Conjugate-13 11/25/2013  . Pneumococcal Polysaccharide-23 09/09/2007    Health Maintenance  Topic Date Due  . PNA vac Low Risk Adult (2 of 2 - PPSV23) 11/26/2014  . OPHTHALMOLOGY EXAM  05/05/2015  . URINE MICROALBUMIN  06/03/2018  . INFLUENZA VACCINE  04/09/2019  . HEMOGLOBIN A1C  05/13/2019  . FOOT EXAM  11/10/2019  . TETANUS/TDAP  01/06/2021  . DEXA SCAN  Completed       Assessment  This is a routine wellness examination for [facility].  Health Maintenance: Due or Overdue Health Maintenance Due  Topic Date Due  . PNA vac Low Risk Adult (2 of 2 - PPSV23) 11/26/2014  . OPHTHALMOLOGY EXAM  05/05/2015  . URINE MICROALBUMIN  06/03/2018    Elizabeth [name] does not need a referral for Community Assistance: Care Management:   no Social Work:    no Prescription Assistance:  no Nutrition/Diabetes Education:  no   Plan:  Personalized Goals Goals Addressed            This Visit's Progress   . Prevent falls       Use cane more - stay active     . Quit smoking / using tobacco    Not on track     Personalized Health Maintenance & Screening Recommendations  Td vaccine  Lung Cancer Screening Recommended: no (Low Dose CT Chest recommended if Age 75-80 years, 30 pack-year currently smoking OR have quit w/in past 15 years) Hepatitis C Screening recommended: no HIV Screening  recommended: no  Advanced Directives: Written information was not prepared per patient's request.  Referrals & Orders No orders of the defined types were placed in this encounter.   Follow-up Plan . Follow-up with Chipper Herb, MD as planned . Copy of advanced directives requested    I have personally reviewed and noted the following in the patient's chart:   . Medical and social history . Use of alcohol, tobacco or illicit drugs  . Current medications and supplements . Functional ability and status . Nutritional status . Physical activity . Advanced directives . List of other physicians . Hospitalizations, surgeries, and ER visits in previous 12 months . Vitals . Screenings to include cognitive, depression, and falls . Referrals and appointments  In addition, I have reviewed and discussed with Elizabeth [name] certain preventive protocols, quality metrics, and best practice recommendations. A written personalized care plan for preventive services as well as general preventive health recommendations is available and can be mailed to the patient at her request.      [place] Dec  02/03/2019

## 2019-02-07 ENCOUNTER — Ambulatory Visit (HOSPITAL_COMMUNITY)
Admission: RE | Admit: 2019-02-07 | Discharge: 2019-02-07 | Disposition: A | Discharge status: Home / Self Care | Payer: PPO | Source: Ambulatory Visit | Attending: Internal Medicine | Admitting: Internal Medicine

## 2019-02-07 ENCOUNTER — Other Ambulatory Visit: Payer: Self-pay

## 2019-02-07 DIAGNOSIS — K7689 Other specified diseases of liver: Secondary | ICD-10-CM | POA: Diagnosis not present

## 2019-02-07 DIAGNOSIS — R945 Abnormal results of liver function studies: Secondary | ICD-10-CM | POA: Diagnosis not present

## 2019-02-07 DIAGNOSIS — R7989 Other specified abnormal findings of blood chemistry: Secondary | ICD-10-CM

## 2019-02-10 DIAGNOSIS — D225 Melanocytic nevi of trunk: Secondary | ICD-10-CM | POA: Diagnosis not present

## 2019-02-10 DIAGNOSIS — D1801 Hemangioma of skin and subcutaneous tissue: Secondary | ICD-10-CM | POA: Diagnosis not present

## 2019-02-10 DIAGNOSIS — L820 Inflamed seborrheic keratosis: Secondary | ICD-10-CM | POA: Diagnosis not present

## 2019-02-12 ENCOUNTER — Other Ambulatory Visit: Payer: Self-pay | Admitting: Nurse Practitioner

## 2019-02-12 ENCOUNTER — Other Ambulatory Visit: Payer: Self-pay | Admitting: Family Medicine

## 2019-02-15 ENCOUNTER — Telehealth: Payer: Self-pay | Admitting: Student

## 2019-02-15 NOTE — Telephone Encounter (Signed)
Received pharmacy request for Plavix refill. Patient is currently taking Plavix 75 mg once daily.  Faxed prescription request to [facility] [phone]) at 1105- Plavix 75 mg tablets, take one tablet by mouth once daily, dispense 30 tablets with 3 refills.   Bea Graff Louk, PA-C 02/15/2019, 11:17 AM

## 2019-02-16 ENCOUNTER — Telehealth: Payer: Self-pay | Admitting: Family Medicine

## 2019-02-16 ENCOUNTER — Other Ambulatory Visit: Payer: Self-pay | Admitting: *Deleted

## 2019-02-16 NOTE — Telephone Encounter (Signed)
Pain medicine has not worked very well.  She tried some xanax ,(from past script ), and only took half a pill but rested at night much better. Please send in a script for her.

## 2019-02-16 NOTE — Telephone Encounter (Signed)
Called but had to leave a voice mail.  Please call patient as requested.

## 2019-02-16 NOTE — Telephone Encounter (Signed)
Pt spoke with Narda Rutherford

## 2019-02-16 NOTE — Telephone Encounter (Signed)
Please try Xanax and write the prescription for the strength of one half the pill that she took previously so give her the exact amount to take and not that she has to take a half of a pill.  Do this and I will sign prescription.

## 2019-02-17 MED ORDER — ALPRAZOLAM 0.25 MG PO TABS: 0.1250 mg | ORAL_TABLET | Freq: Every evening | ORAL | 1 refills | Status: DC | PRN

## 2019-02-17 NOTE — Addendum Note (Signed)
Addended by: Zannie Cove on: 02/17/2019 09:55 AM   Modules accepted: Orders

## 2019-03-15 ENCOUNTER — Encounter (INDEPENDENT_AMBULATORY_CARE_PROVIDER_SITE_OTHER): Payer: Self-pay | Admitting: *Deleted

## 2019-03-23 ENCOUNTER — Ambulatory Visit: Payer: PPO | Admitting: Family Medicine

## 2019-03-25 DIAGNOSIS — E1169 Type 2 diabetes mellitus with other specified complication: Secondary | ICD-10-CM | POA: Insufficient documentation

## 2019-03-25 DIAGNOSIS — E785 Hyperlipidemia, unspecified: Secondary | ICD-10-CM | POA: Insufficient documentation

## 2019-03-28 ENCOUNTER — Ambulatory Visit (INDEPENDENT_AMBULATORY_CARE_PROVIDER_SITE_OTHER): Payer: PPO | Admitting: Internal Medicine

## 2019-03-28 ENCOUNTER — Other Ambulatory Visit: Payer: Self-pay

## 2019-03-28 ENCOUNTER — Encounter (INDEPENDENT_AMBULATORY_CARE_PROVIDER_SITE_OTHER): Payer: Self-pay | Admitting: Internal Medicine

## 2019-03-28 VITALS — BP 147/78 | HR 85 | Temp 98.2°F | Resp 18 | Ht 62.0 in | Wt 178.5 lb

## 2019-03-28 DIAGNOSIS — R945 Abnormal results of liver function studies: Secondary | ICD-10-CM

## 2019-03-28 DIAGNOSIS — K219 Gastro-esophageal reflux disease without esophagitis: Secondary | ICD-10-CM

## 2019-03-28 DIAGNOSIS — R7989 Other specified abnormal findings of blood chemistry: Secondary | ICD-10-CM

## 2019-03-28 DIAGNOSIS — D509 Iron deficiency anemia, unspecified: Secondary | ICD-10-CM | POA: Diagnosis not present

## 2019-03-28 MED ORDER — PANTOPRAZOLE SODIUM 40 MG PO TBEC: 40.0000 mg | DELAYED_RELEASE_TABLET | Freq: Every day | ORAL | 2 refills | Status: DC

## 2019-03-28 MED ORDER — URSODIOL 300 MG PO CAPS: 300.0000 mg | ORAL_CAPSULE | Freq: Three times a day (TID) | ORAL | 5 refills | Status: DC

## 2019-03-28 NOTE — Patient Instructions (Signed)
Physician will call with results of blood test. 

## 2019-03-28 NOTE — Progress Notes (Signed)
Presenting complaint;  Follow-up for elevated transaminases and history of iron deficiency anemia.  Database and subjective:  Patient is 78 year old Caucasian female who was evaluated in December for iron deficiency anemia.  She had EGD and colonoscopy followed by given capsule study.  She was bleeding from distal small bowel.  While lesions were not seen was felt she may have aspirin use injury to her small bowel.  I recommended dropping aspirin dose if possible. Patient remains on full dose aspirin and clopidogrel.  Her hemoglobin has returned to normal with p.o. iron.  Patient will also evaluate for mildly elevated transaminases that she has had for at least 1 year.  Hepatitis B surface antigen and HCV antibody were negative.  Hepatitis B surface antibody was positive implying immunity.  She has received hep B vaccine in the past. Last month she had ultrasound with elastography.  This study showed changes of cirrhosis and elastography suggested fibrosis score of F2/F3.  She has no complaints.  She says she has changed her eating habits significantly.  She made meat once a week.  Most of the time she eats fish and chicken.  She has occasional pruritus but never intractable.  She denies abdominal pain.  She denies melena or rectal bleeding.  She has passing formed stools.  She feels heartburn is well controlled with PPI.  She denies dysphagia.  She needs refill on pantoprazole. Patient tells me that she is scheduled to have blood work next week.  Current Medications: Outpatient Encounter Medications as of 03/28/2019  Medication Sig  . albuterol (PROAIR HFA) 108 (90 Base) MCG/ACT inhaler INHALE 2 PUFFS EVERY SIX HOURS AS NEEDED FOR WHEEZING OR SHORTNESS OF BREATH (Patient taking differently: Inhale 1 puff into the lungs every 6 (six) hours as needed for wheezing or shortness of breath. )  . ALPRAZolam (XANAX) 0.25 MG tablet Take 0.5-1 tablets (0.125-0.25 mg total) by mouth at bedtime as needed for  anxiety.  Marland Kitchen amLODipine (NORVASC) 10 MG tablet TAKE 1 TABLET(10 MG) BY MOUTH DAILY  . aspirin 325 MG tablet Take 325 mg by mouth daily.  Marland Kitchen azelastine (ASTELIN) 0.1 % nasal spray Place 1 spray into both nostrils 2 (two) times daily. Use in each nostril as directed  . Carboxymethylcellulose Sodium (REFRESH LIQUIGEL) 1 % GEL Place 1 drop into both eyes 2 (two) times daily.   Marland Kitchen CINNAMON PO Take 1,000 mg by mouth daily as needed (for blood sugars).   . clopidogrel (PLAVIX) 75 MG tablet TAKE ONE TABLET BY MOUTH DAILY (PLEASE USE AUROBINDO BRAND, PT HD FOR ALLERGY TO REDDY). Hold for 5 days  . cyproheptadine (PERIACTIN) 4 MG tablet Take 2 mg by mouth daily as needed for allergies.  Marland Kitchen gabapentin (NEURONTIN) 100 MG capsule Take 1 capsule (100 mg total) by mouth at bedtime.  . hydrochlorothiazide (MICROZIDE) 12.5 MG capsule TAKE 1 CAPSULE BY MOUTH EVERY DAY  . JANUVIA 50 MG tablet TAKE ONE TABLET BY MOUTH DAILY  . metoCLOPramide (REGLAN) 10 MG tablet TAKE ONE TABLET BY MOUTH DAILY AS NEEDED  . montelukast (SINGULAIR) 5 MG chewable tablet CHEW TWO TABLETS BY MOUTH AT BEDTIME  . pantoprazole (PROTONIX) 40 MG tablet Take 1 tablet (40 mg total) by mouth 2 (two) times daily.  . pediatric multivitamin-iron (POLY-VI-SOL WITH IRON) 15 MG chewable tablet Chew 1 tablet by mouth daily.  . traMADol (ULTRAM) 50 MG tablet Take 50 mg by mouth daily as needed. Usually 1/2 tab - one a week  . Vitamin D, Ergocalciferol, (DRISDOL)  1.25 MG (50000 UT) CAPS capsule TAKE 1 CAPSULE BY MOUTH EVERY SEVEN DAYS  . [DISCONTINUED] metFORMIN (GLUCOPHAGE) 1000 MG tablet Take 1 tablet (1,000 mg total) by mouth daily with breakfast. (Patient not taking: Reported on 02/03/2019)  . [DISCONTINUED] ramipril (ALTACE) 10 MG capsule Take 10 mg by mouth daily.     No facility-administered encounter medications on file as of 03/28/2019.      Objective: Blood pressure (!) 147/78, pulse 85, temperature 98.2 F (36.8 C), temperature source Oral,  resp. rate 18, height _0  (1.575 m), weight 178 lb 8 oz (81 kg). Patient is alert and in no acute distress. Conjunctiva is pink. Sclera is nonicteric Oropharyngeal mucosa is normal. No neck masses or thyromegaly noted. Cardiac exam with regular rhythm normal S1 and S2. No murmur or gallop noted. Lungs are clear to auscultation. Abdomen is full.  On palpation abdomen is soft.  Spleen is nonpalpable.  Liver edge is palpable 3 to 4 cm below RCM lateral to the midclavicular line.  It is not firm or tender. No LE edema or clubbing noted.  Labs/studies Results:  CBC Latest Ref Rng & Units 11/10/2018 08/25/2018 08/18/2018  WBC 3.4 - 10.8 x10E3/uL 6.6 - -  Hemoglobin 11.1 - 15.9 g/dL 12.4 10.0(L) 10.5(L)  Hematocrit 34.0 - 46.6 % 40.6 33.3(L) 34.8(L)  Platelets 150 - 450 x10E3/uL 275 - -    CMP Latest Ref Rng & Units 01/20/2019 01/12/2019 11/10/2018  Glucose 65 - 99 mg/dL - - 394(H)  BUN 8 - 27 mg/dL - - 10  Creatinine 0.44 - 1.00 mg/dL - 0.82 0.72  Sodium 134 - 144 mmol/L - - 137  Potassium 3.5 - 5.2 mmol/L - - 4.1  Chloride 96 - 106 mmol/L - - 95(L)  CO2 20 - 29 mmol/L - - 27  Calcium 8.7 - 10.3 mg/dL - - 10.5(H)  Total Protein 6.1 - 8.1 g/dL 7.4 - 6.9  Total Bilirubin 0.2 - 1.2 mg/dL 0.9 - 0.4  Alkaline Phos 39 - 117 IU/L - - 142(H)  AST 10 - 35 U/L 57(H) - 69(H)  ALT 6 - 29 U/L 55(H) - 66(H)    Hepatic Function Latest Ref Rng & Units 01/20/2019 11/10/2018 08/18/2018  Total Protein 6.1 - 8.1 g/dL 7.4 6.9 6.7  Albumin 3.7 - 4.7 g/dL - 4.2 3.6  AST 10 - 35 U/L 57(H) 69(H) 52(H)  ALT 6 - 29 U/L 55(H) 66(H) 41  Alk Phosphatase 39 - 117 IU/L - 142(H) 76  Total Bilirubin 0.2 - 1.2 mg/dL 0.9 0.4 1.6(H)  Bilirubin, Direct 0.0 - 0.2 mg/dL 0.3(H) 0.15 -     Assessment:  #1.  Elevated transaminases.  She has had mildly elevated transaminases for at least 1 year.  Alkaline phosphatase has been normal.  Recent ultrasound and elastography suggests cirrhosis and fibrosis score of F2/F3.  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  Options at this time would be to proceed with therapy versus liver biopsy.  Patient would prefer to be treated and see how she does which I think would be appropriate given therapy with her so is low risk.  #2.  History of iron deficiency anemia.  She has responded to p.o. iron.  #3. GERD.  She is doing well with single dose PPI.   Plan:  She will go to the lab for LFTs, CBC and serum IgM. Begin URSO 300 mg by mouth 3 times a day. New prescription  for pantoprazole 40 mg p.o. every morning sent to patient's pharmacy for 90 doses with 3 refills. Office visit in 3 months.

## 2019-03-30 ENCOUNTER — Encounter: Payer: Self-pay | Admitting: Family Medicine

## 2019-03-30 ENCOUNTER — Other Ambulatory Visit: Payer: Self-pay

## 2019-03-30 ENCOUNTER — Ambulatory Visit (INDEPENDENT_AMBULATORY_CARE_PROVIDER_SITE_OTHER): Payer: PPO | Admitting: Family Medicine

## 2019-03-30 VITALS — BP 140/77 | HR 89 | Temp 98.6°F | Ht 62.0 in | Wt 178.0 lb

## 2019-03-30 DIAGNOSIS — Z79899 Other long term (current) drug therapy: Secondary | ICD-10-CM

## 2019-03-30 DIAGNOSIS — F411 Generalized anxiety disorder: Secondary | ICD-10-CM

## 2019-03-30 DIAGNOSIS — E1169 Type 2 diabetes mellitus with other specified complication: Secondary | ICD-10-CM | POA: Diagnosis not present

## 2019-03-30 DIAGNOSIS — E785 Hyperlipidemia, unspecified: Secondary | ICD-10-CM

## 2019-03-30 DIAGNOSIS — E079 Disorder of thyroid, unspecified: Secondary | ICD-10-CM

## 2019-03-30 DIAGNOSIS — E1159 Type 2 diabetes mellitus with other circulatory complications: Secondary | ICD-10-CM

## 2019-03-30 DIAGNOSIS — I10 Essential (primary) hypertension: Secondary | ICD-10-CM | POA: Diagnosis not present

## 2019-03-30 DIAGNOSIS — E1142 Type 2 diabetes mellitus with diabetic polyneuropathy: Secondary | ICD-10-CM

## 2019-03-30 LAB — BAYER DCA HB A1C WAIVED: HB A1C (BAYER DCA - WAIVED): 8.8 % — ABNORMAL HIGH (ref <7.0)

## 2019-03-30 MED ORDER — ALPRAZOLAM 0.25 MG PO TABS: 0.1250 mg | ORAL_TABLET | Freq: Every evening | ORAL | 1 refills | Status: DC | PRN

## 2019-03-30 MED ORDER — SITAGLIPTIN PHOSPHATE 100 MG PO TABS: 100.0000 mg | ORAL_TABLET | Freq: Every day | ORAL | 2 refills | Status: DC

## 2019-03-30 NOTE — Patient Instructions (Signed)
You A1c remains high.  I have increased your Januvia tablet to 100mg  daily.  You make take 2 of the 50mg  until you finish your current bottle, then switch to 1 of the 100mg  tablets daily.  Get your diabetic eye exam done and have it sent to me.  Xanax also sent.

## 2019-03-30 NOTE — Progress Notes (Signed)
Subjective: CC: DM2, HTN, HLD, Anxiety PCP: Janora Norlander, DO Elizabeth [name] is a 78 y.o. female presenting to clinic today for:  1. Type 2 Diabetes w/ HTN, HLD:  Patient reports high at home no more than 150.  She certainly sees no numbers above 200 or below 60.  She reports compliance with Januvia 50 mg daily, Norvasc 10 mg daily and hydrochlorothiazide 12.5 mg daily.  She notes that she was discontinued from statins due to cirrhosis of the liver and persistently elevated LFTs.  She was recently started on Actigall by her gastroenterologist.  Thus far she seems to be tolerating medication well as long as she eats with it.  Denies any visual disturbance, chest pain, shortness of breath.  Last eye exam: Needs, sees Dr. Radford Pax Last foot exam: Up-to-date Last A1c:  Lab Results  Component Value Date   HGBA1C 9.5 (H) 11/10/2018   Nephropathy screen indicated?:  Needs Last flu, zoster and/or pneumovax:  Immunization History  Administered Date(s) Administered  . Influenza Whole 06/08/2010  . Influenza,inj,Quad PF,6+ Mos 07/07/2013, 07/06/2014, 07/17/2017  . Influenza-Unspecified 08/05/2016  . Pneumococcal Conjugate-13 11/25/2013  . Pneumococcal Polysaccharide-23 09/09/2007    2.  Generalized anxiety disorder/insomnia Patient reports very rare use of Xanax 0.25 mg.  She actually cuts this pill in half and takes perhaps 1 time per week if needed.  Overall mood is fairly well-controlled and symptoms seem to be predominantly at bedtime.  Denies excessive sedation during the daytime, confusion or falls.  Mother with history of "being hooked on something" and therefore she uses this very very sparingly.  3.  Thyroid disorder Was told that she has a thyroid disorder something to do with her T3 and T4 in the past.  She denies any history of radiation or surgery to the neck.  She is not on thyroid replacement.  She does not endorse any hyper or hypothyroid symptoms.  ROS: Per HPI   Allergies  Allergen Reactions  . Banana Anaphylaxis  . Codeine Nausea And Vomiting and Other (See Comments)    PROJECTILE VOMITING  . Stevia [Stevioside] Swelling and Other (See Comments)    Face, tongue, and eye swelling   . Actos [Pioglitazone] Swelling  . Benicar [Olmesartan Medoxomil] Other (See Comments)    Feel bad  . Chantix [Varenicline] Other (See Comments)    "Worked opposite."  . Diovan [Valsartan] Other (See Comments)    Feel bad  . Lipitor [Atorvastatin] Other (See Comments)    REACTION:  Joint pain  . Spinach Other (See Comments)    Stomach problems  . Vicodin [Hydrocodone-Acetaminophen] Nausea And Vomiting  . Zocor [Simvastatin] Other (See Comments)    REACTION:  "made liver function incorrectly"  . Ace Inhibitors Cough  . Celebrex [Celecoxib] Rash  . Latex Rash and Other (See Comments)    REACTION: red rash  . Metformin And Related     GI - upset   . Penicillins Rash and Other (See Comments)    Has patient had a PCN reaction causing immediate rash, facial/tongue/throat swelling, SOB or lightheadedness with hypotension: No Has patient had a PCN reaction causing severe rash involving mucus membranes or skin necrosis: No Has patient had a PCN reaction that required hospitalization: No- MD office Has patient had a PCN reaction occurring within the last 10 years: No If all of the above answers are "NO", then may proceed with Cephalosporin use.   . Sulfonamide Derivatives Rash   Past Medical History:  Diagnosis Date  .  Absolute anemia 07/28/2018  . Active smoker   . Allergy   . Anxiety    Takes Xanax for anxiety  . Arthritis   . Asthma   . Cataract   . Chronic airway obstruction, not elsewhere classified   . Clotting disorder ([place])    stoke   . Depression   . Diarrhea   . Diverticulosis of colon (without mention of hemorrhage)   . Esophageal reflux   . Family history of malignant neoplasm of gastrointestinal tract   . GI bleed   . Headache(784.0)     Irregular  . Neuromuscular disorder (HCC)    numbness is left hand and left elbow  . Obesity, unspecified   . Other and unspecified hyperlipidemia   . Personal history of colonic polyps   . Stricture and stenosis of esophagus   . Stroke ([place])   . Thyroid disease   . Type II or unspecified type diabetes mellitus without mention of complication, not stated as uncontrolled   . Unspecified asthma(493.90)   . Unspecified essential hypertension     Current Outpatient Medications:  .  albuterol (PROAIR HFA) 108 (90 Base) MCG/ACT inhaler, INHALE 2 PUFFS EVERY SIX HOURS AS NEEDED FOR WHEEZING OR SHORTNESS OF BREATH (Patient taking differently: Inhale 1 puff into the lungs every 6 (six) hours as needed for wheezing or shortness of breath. ), Disp: 8.5 g, Rfl: 2 .  ALPRAZolam (XANAX) 0.25 MG tablet, Take 0.5-1 tablets (0.125-0.25 mg total) by mouth at bedtime as needed for anxiety., Disp: 30 tablet, Rfl: 1 .  amLODipine (NORVASC) 10 MG tablet, TAKE 1 TABLET(10 MG) BY MOUTH DAILY, Disp: 90 tablet, Rfl: 0 .  aspirin 325 MG tablet, Take 325 mg by mouth daily., Disp: , Rfl:  .  azelastine (ASTELIN) 0.1 % nasal spray, Place 1 spray into both nostrils 2 (two) times daily. Use in each nostril as directed, Disp: 30 mL, Rfl: 12 .  Carboxymethylcellulose Sodium (REFRESH LIQUIGEL) 1 % GEL, Place 1 drop into both eyes 2 (two) times daily. , Disp: , Rfl:  .  CINNAMON PO, Take 1,000 mg by mouth daily as needed (for blood sugars). , Disp: , Rfl:  .  clopidogrel (PLAVIX) 75 MG tablet, TAKE ONE TABLET BY MOUTH DAILY (PLEASE USE AUROBINDO BRAND, PT HD FOR ALLERGY TO REDDY). Hold for 5 days, Disp: 90 tablet, Rfl: 1 .  cyproheptadine (PERIACTIN) 4 MG tablet, Take 2 mg by mouth daily as needed for allergies., Disp: , Rfl:  .  gabapentin (NEURONTIN) 100 MG capsule, Take 1 capsule (100 mg total) by mouth at bedtime., Disp: 90 capsule, Rfl: 1 .  hydrochlorothiazide (MICROZIDE) 12.5 MG capsule, TAKE 1 CAPSULE BY MOUTH EVERY  DAY, Disp: 90 capsule, Rfl: 0 .  JANUVIA 50 MG tablet, TAKE ONE TABLET BY MOUTH DAILY, Disp: 30 tablet, Rfl: 2 .  metoCLOPramide (REGLAN) 10 MG tablet, TAKE ONE TABLET BY MOUTH DAILY AS NEEDED, Disp: 30 tablet, Rfl: 5 .  montelukast (SINGULAIR) 5 MG chewable tablet, CHEW TWO TABLETS BY MOUTH AT BEDTIME, Disp: 60 tablet, Rfl: 2 .  pantoprazole (PROTONIX) 40 MG tablet, Take 1 tablet (40 mg total) by mouth daily before breakfast., Disp: 90 tablet, Rfl: 2 .  pediatric multivitamin-iron (POLY-VI-SOL WITH IRON) 15 MG chewable tablet, Chew 1 tablet by mouth daily., Disp: , Rfl:  .  traMADol (ULTRAM) 50 MG tablet, Take 50 mg by mouth daily as needed. Usually 1/2 tab - one a week, Disp: , Rfl:  .  ursodiol (ACTIGALL)  300 MG capsule, Take 1 capsule (300 mg total) by mouth 3 (three) times daily., Disp: 90 capsule, Rfl: 5 .  Vitamin D, Ergocalciferol, (DRISDOL) 1.25 MG (50000 UT) CAPS capsule, TAKE 1 CAPSULE BY MOUTH EVERY SEVEN DAYS, Disp: 12 capsule, Rfl: 3 Social History   Socioeconomic History  . Marital status: Married    Spouse name: [name]  . Number of children: 0  . Years of education: 16  . Highest education level: Bachelor's degree (e.g., BA, AB, BS)  Occupational History  . 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 Financial resource strain: Not hard at all  . Food insecurity    Worry: Never true    Inability: Never true  . Transportation needs    Medical: No    Non-medical: No  Tobacco Use  . Smoking status: Current Every Day Smoker    Packs/day: 1.00    Years: 20.00    Pack years: 20.00    Types: Cigarettes    Start date: 01/15/1983  . Smokeless tobacco: Never Used  Substance and Sexual Activity  . Alcohol use: No    Alcohol/week: 0.0 standard drinks  . Drug use: No  . Sexual activity: Not Currently  Lifestyle  . Physical activity    Days per week: 0 days    Minutes per session: 0 min  . Stress: To some extent  Relationships  . Social  Herbalist on phone: Not on file    Gets together: Not on file    Attends religious service: Not on file    Active member of club or organization: Not on file    Attends meetings of clubs or organizations: Not on file    Relationship status: Not on file  . Intimate partner violence    Fear of current or ex partner: No    Emotionally abused: No    Physically abused: No    Forced sexual activity: No  Other Topics Concern  . Not on file  Social History Narrative   Married [name]   Limited activity due to back pain   Family History  Problem Relation Age of Onset  . Diabetes Mother   . Heart disease Mother   . Asthma Mother   . Kidney disease Mother   . Drug abuse Mother   . Hypertension Mother   . Stroke Mother        fall -several bleed on brain 1988/10/30  . Early death Father        trauma  . Colon cancer Other        first cousin, paternal aunt and uncle  . Cancer Paternal Grandmother        gastric  . Diabetes Paternal Grandmother   . Mental illness Paternal Grandmother   . Heart disease Maternal Grandmother   . Thyroid cancer Other        aunt  . Heart disease Sister   . Mental illness Sister   . Heart attack Sister   . Heart disease Brother   . Lung disease Brother   . Cancer Sister        pancreatic  . Diabetes Sister   . Hypertension Sister   . Tuberculosis Maternal Grandfather   . Heart disease Paternal Grandfather     Objective: Office vital signs reviewed. BP 140/77   Pulse 89   Temp 98.6 F (37 C) (Temporal)   Ht _0  (1.575 m)  Wt 178 lb (80.7 kg)   BMI 32.56 kg/m   Physical Examination:  General: Awake, alert, well nourished, No acute distress HEENT: Normal, sclera white.  No goiter Cardio: regular rate and rhythm, S1S2 heard, no murmurs appreciated Pulm: clear to auscultation bilaterally, no wheezes, rhonchi or rales; normal work of breathing on room air Extremities: warm, well perfused, No edema, cyanosis or clubbing; +2 pulses  bilaterally MSK: Normal gait and station Psych: Mood stable, speech normal, affect appropriate, pleasant and interactive Depression screen [employer] 2/9 03/30/2019 02/03/2019 11/10/2018  Decreased Interest 0 1 1  Down, Depressed, Hopeless 0 0 0  PHQ - 2 Score 0 1 1  Altered sleeping 0 - -  Tired, decreased energy 2 - -  Change in appetite 0 - -  Feeling bad or failure about yourself  0 - -  Trouble concentrating 1 - -  Moving slowly or fidgety/restless 1 - -  Suicidal thoughts 0 - -  PHQ-9 Score 4 - -  Difficult doing work/chores - - -  Some recent data might be hidden   GAD 7 : Generalized Anxiety Score 03/30/2019  Nervous, Anxious, on Edge 2  Control/stop worrying 0  Worry too much - different things 0  Trouble relaxing 0  Restless 0  Easily annoyed or irritable 1  Afraid - awful might happen 1  Total GAD 7 Score 4    Assessment/ Plan: 78 y.o. female   1. Type 2 diabetes mellitus with peripheral neuropathy (HCC) Not at goal with A1c of 8.8 today.  This is down some from her previous A1c of 9.5.  We discussed increasing her Januvia dose to 100 mg daily.  We will recheck in 3 months.  She will get her diabetic eye exam.  Needs second pneumococcal vaccination.  Urine microalbumin will need to be obtained at next visit as there was insufficient urine to send this. - Bayer DCA Hb A1c Waived - CMP14+EGFR - Microalbumin / creatinine urine ratio  2. Hyperlipidemia associated with type 2 diabetes mellitus ([place]) Cannot tolerate statin secondary to cirrhosis of the liver.  Will need to have excellent sugar control in efforts to reduce cardiovascular risk. - Lipid Panel - CMP14+EGFR  3. Thyroid disease Uncertain as to exactly what the thyroid disease is.  Patient is not on thyroid medication.  Check thyroid panel and repeat LFTs - Thyroid Panel With TSH - CMP14+EGFR  4. Hypertension associated with diabetes ([place]) Controlled.  Continue current regimen - CMP14+EGFR  5. Generalized  anxiety disorder Stable.  Rare use of Xanax and no evidence of dependence or abuse.  National cardiac database was reviewed and there were no red flags.  Xanax was renewed in my name since her previous PCP has retired;  though she did not quite need a refill. - ToxASSURE Select 13 (MW), Urine  6. Controlled substance agreement signed - ToxASSURE Select 13 (MW), Urine   Orders Placed This Encounter  Procedures  . Bayer DCA Hb A1c Waived  . Thyroid Panel With TSH  . Lipid Panel  . CMP14+EGFR  . ToxASSURE Select 13 (MW), Urine  . Microalbumin / creatinine urine ratio   Meds ordered this encounter  Medications  . sitaGLIPtin (JANUVIA) 100 MG tablet    Sig: Take 1 tablet (100 mg total) by mouth daily.    Dispense:  30 tablet    Refill:  2  . ALPRAZolam (XANAX) 0.25 MG tablet    Sig: Take 0.5-1 tablets (0.125-0.25 mg total) by mouth at bedtime  as needed for anxiety.    Dispense:  30 tablet    Refill:  1    Please cancel previous Rx by Dr Laurance Flatten and replace with this on.  Ok to fill 30 days after last rx     Janora Norlander, [place] [phone]

## 2019-03-31 LAB — CMP14+EGFR
ALT: 48 IU/L — ABNORMAL HIGH (ref 0–32)
AST: 54 IU/L — ABNORMAL HIGH (ref 0–40)
Albumin/Globulin Ratio: 2 (ref 1.2–2.2)
Albumin: 4.4 g/dL (ref 3.7–4.7)
Alkaline Phosphatase: 117 IU/L (ref 39–117)
BUN/Creatinine Ratio: 11 — ABNORMAL LOW (ref 12–28)
BUN: 9 mg/dL (ref 8–27)
Bilirubin Total: 0.5 mg/dL (ref 0.0–1.2)
CO2: 23 mmol/L (ref 20–29)
Calcium: 10.2 mg/dL (ref 8.7–10.3)
Chloride: 94 mmol/L — ABNORMAL LOW (ref 96–106)
Creatinine, Ser: 0.83 mg/dL (ref 0.57–1.00)
GFR calc Af Amer: 79 mL/min/{1.73_m2} (ref >59)
GFR calc non Af Amer: 69 mL/min/{1.73_m2} (ref >59)
Globulin, Total: 2.2 g/dL (ref 1.5–4.5)
Glucose: 331 mg/dL — ABNORMAL HIGH (ref 65–99)
Potassium: 4 mmol/L (ref 3.5–5.2)
Sodium: 134 mmol/L (ref 134–144)
Total Protein: 6.6 g/dL (ref 6.0–8.5)

## 2019-03-31 LAB — THYROID PANEL WITH TSH
Free Thyroxine Index: 2.3 (ref 1.2–4.9)
T3 Uptake Ratio: 23 % — ABNORMAL LOW (ref 24–39)
T4, Total: 10 ug/dL (ref 4.5–12.0)
TSH: 1.85 u[IU]/mL (ref 0.450–4.500)

## 2019-03-31 LAB — LIPID PANEL
Chol/HDL Ratio: 6.5 ratio — ABNORMAL HIGH (ref 0.0–4.4)
Cholesterol, Total: 241 mg/dL — ABNORMAL HIGH (ref 100–199)
HDL: 37 mg/dL — ABNORMAL LOW (ref >39)
LDL Calculated: 170 mg/dL — ABNORMAL HIGH (ref 0–99)
Triglycerides: 172 mg/dL — ABNORMAL HIGH (ref 0–149)
VLDL Cholesterol Cal: 34 mg/dL (ref 5–40)

## 2019-04-02 LAB — TOXASSURE SELECT 13 (MW), URINE

## 2019-04-06 ENCOUNTER — Other Ambulatory Visit: Payer: Self-pay | Admitting: Family Medicine

## 2019-04-07 ENCOUNTER — Other Ambulatory Visit (INDEPENDENT_AMBULATORY_CARE_PROVIDER_SITE_OTHER): Payer: Self-pay | Admitting: *Deleted

## 2019-04-07 ENCOUNTER — Encounter: Payer: Self-pay | Admitting: *Deleted

## 2019-04-07 DIAGNOSIS — D509 Iron deficiency anemia, unspecified: Secondary | ICD-10-CM

## 2019-04-08 DIAGNOSIS — D509 Iron deficiency anemia, unspecified: Secondary | ICD-10-CM | POA: Diagnosis not present

## 2019-04-08 LAB — CBC
HCT: 38 % (ref 35.0–45.0)
Hemoglobin: 12 g/dL (ref 11.7–15.5)
MCH: 25.1 pg — ABNORMAL LOW (ref 27.0–33.0)
MCHC: 31.6 g/dL — ABNORMAL LOW (ref 32.0–36.0)
MCV: 79.3 fL — ABNORMAL LOW (ref 80.0–100.0)
MPV: 10.7 fL (ref 7.5–12.5)
Platelets: 291 10*3/uL (ref 140–400)
RBC: 4.79 10*6/uL (ref 3.80–5.10)
RDW: 13.1 % (ref 11.0–15.0)
WBC: 5.5 10*3/uL (ref 3.8–10.8)

## 2019-04-11 ENCOUNTER — Other Ambulatory Visit (INDEPENDENT_AMBULATORY_CARE_PROVIDER_SITE_OTHER): Payer: Self-pay | Admitting: *Deleted

## 2019-04-11 DIAGNOSIS — D509 Iron deficiency anemia, unspecified: Secondary | ICD-10-CM

## 2019-04-11 NOTE — Progress Notes (Signed)
cbc

## 2019-04-15 ENCOUNTER — Other Ambulatory Visit: Payer: Self-pay | Admitting: Family Medicine

## 2019-04-18 ENCOUNTER — Telehealth: Payer: Self-pay | Admitting: Cardiology

## 2019-04-18 DIAGNOSIS — E1169 Type 2 diabetes mellitus with other specified complication: Secondary | ICD-10-CM

## 2019-04-18 DIAGNOSIS — E785 Hyperlipidemia, unspecified: Secondary | ICD-10-CM

## 2019-04-18 NOTE — Telephone Encounter (Signed)
New message    Patient states that Dr Laural Golden told her to stop the statin for her cholesterol due to it causing her to have liver problems .  Patient would like to speak with Dr Harl Bowie about this or having him call Dr Laural Golden

## 2019-04-18 NOTE — Telephone Encounter (Signed)
Patient states she got cirrhosis from statins and was told by dr.Reham not to take them.Now, her pcp wants to start her on a statin because of recent lipid results. She wants your input.

## 2019-04-19 NOTE — Telephone Encounter (Signed)
Spoke with pt who states she has not spoken with lipid clinic regarding an alternative. She did speak with someone at [facility] regarding this, but would like to have the lipid clinic call her.

## 2019-04-19 NOTE — Telephone Encounter (Signed)
I would avoid statins. At our last visit we referred her to lipid clinic to discuss an alternative called [id number] inhibitors as an alternative , Did she ever see them? If not please place another referal   Zandra Abts MD

## 2019-04-26 ENCOUNTER — Other Ambulatory Visit (HOSPITAL_COMMUNITY): Payer: Self-pay | Admitting: Family Medicine

## 2019-04-26 DIAGNOSIS — Z1231 Encounter for screening mammogram for malignant neoplasm of breast: Secondary | ICD-10-CM

## 2019-04-29 ENCOUNTER — Other Ambulatory Visit: Payer: Self-pay | Admitting: Family Medicine

## 2019-05-17 ENCOUNTER — Ambulatory Visit (INDEPENDENT_AMBULATORY_CARE_PROVIDER_SITE_OTHER): Payer: PPO | Admitting: Family

## 2019-05-17 ENCOUNTER — Encounter: Payer: Self-pay | Admitting: Family

## 2019-05-17 ENCOUNTER — Telehealth: Payer: Self-pay | Admitting: Family Medicine

## 2019-05-17 DIAGNOSIS — J441 Chronic obstructive pulmonary disease with (acute) exacerbation: Secondary | ICD-10-CM

## 2019-05-17 DIAGNOSIS — J301 Allergic rhinitis due to pollen: Secondary | ICD-10-CM

## 2019-05-17 MED ORDER — PREDNISONE 10 MG (21) PO TBPK: ORAL_TABLET | ORAL | 0 refills | Status: DC

## 2019-05-17 NOTE — Telephone Encounter (Signed)
appt scheduled Pt notified 

## 2019-05-17 NOTE — Progress Notes (Signed)
   Virtual Visit via telephone Note Due to COVID-19 pandemic this visit was conducted virtually. This visit type was conducted due to national recommendations for restrictions regarding the COVID-19 Pandemic (e.g. social distancing, sheltering in place) in an effort to limit this patient's exposure and mitigate transmission in our community. All issues noted in this document were discussed and addressed.  A physical exam was not performed with this format.  I connected with Elizabeth [name] on 05/17/19 at 11:05 AM by telephone and verified that I am speaking with the correct person using two identifiers. Elizabeth [name] is currently located at home and no one is currently with her during visit. The provider, Evelina Dun, FNP is located in their office at time of visit.  I discussed the limitations, risks, security and privacy concerns of performing an evaluation and management service by telephone and the availability of in person appointments. I also discussed with the patient that there may be a patient responsible charge related to this service. The patient expressed understanding and agreed to proceed.   History and Present Illness:  URI  This is a new problem. The current episode started today. The problem has been unchanged. There has been no fever. Associated symptoms include congestion, coughing (intermittent), ear pain, headaches, rhinorrhea, sinus pain and sneezing. Associated symptoms comments: Hoarse voice. She has tried increased fluids for the symptoms. The treatment provided mild relief.      Review of Systems  HENT: Positive for congestion, ear pain, rhinorrhea, sinus pain and sneezing.   Respiratory: Positive for cough (intermittent).   Neurological: Positive for headaches.  All other systems reviewed and are negative.    Observations/Objective: No SOB or distress noted, hoarse voice noted  Assessment and Plan: 1. Allergic rhinitis due to pollen, unspecified seasonality  2.  COPD exacerbation (HCC) - predniSONE (STERAPRED UNI-PAK 21 TAB) 10 MG (21) TBPK tablet; Use as directed  Dispense: 21 tablet; Refill: 0  Rest Force fluids Continue medications Pt reports she quit smoking 2 weeks ago!! Keep up the great work and hand in there!!! Call office if symptoms worsen or do not improve     I discussed the assessment and treatment plan with the patient. The patient was provided an opportunity to ask questions and all were answered. The patient agreed with the plan and demonstrated an understanding of the instructions.   The patient was advised to call back or seek an in-person evaluation if the symptoms worsen or if the condition fails to improve as anticipated.  The above assessment and management plan was discussed with the patient. The patient verbalized understanding of and has agreed to the management plan. Patient is aware to call the clinic if symptoms persist or worsen. Patient is aware when to return to the clinic for a follow-up visit. Patient educated on when it is appropriate to go to the emergency department.   Time call ended:  11:17 AM   I provided 12 minutes of non-face-to-face time during this encounter.    Evelina Dun, FNP

## 2019-05-31 NOTE — Telephone Encounter (Signed)
Please schedule Lipid Clinic appt (pharmacist) next available to discuss therapy options.

## 2019-06-01 ENCOUNTER — Telehealth: Payer: Self-pay | Admitting: Student

## 2019-06-01 NOTE — Telephone Encounter (Signed)
NIR.  Received prescription refill request for Plavix. Faxed prescription refill to [facility] in [place], [place] [phone]) at 1425- Plavix 75 mg tablets, take one tablet by mouth once daily, dispense 30 tablets with 3 refills.   Bea Graff Louk, PA-C 06/01/2019, 2:40 PM

## 2019-06-03 ENCOUNTER — Other Ambulatory Visit (INDEPENDENT_AMBULATORY_CARE_PROVIDER_SITE_OTHER): Payer: Self-pay | Admitting: *Deleted

## 2019-06-03 DIAGNOSIS — D509 Iron deficiency anemia, unspecified: Secondary | ICD-10-CM

## 2019-06-07 ENCOUNTER — Ambulatory Visit: Payer: PPO | Admitting: Internal Medicine

## 2019-06-22 ENCOUNTER — Encounter: Payer: Self-pay | Admitting: *Deleted

## 2019-07-01 ENCOUNTER — Ambulatory Visit: Payer: PPO | Admitting: Family Medicine

## 2019-07-01 DIAGNOSIS — D509 Iron deficiency anemia, unspecified: Secondary | ICD-10-CM | POA: Diagnosis not present

## 2019-07-02 LAB — HEPATIC FUNCTION PANEL
AG Ratio: 1.3 (calc) (ref 1.0–2.5)
ALT: 34 U/L — ABNORMAL HIGH (ref 6–29)
AST: 39 U/L — ABNORMAL HIGH (ref 10–35)
Albumin: 3.9 g/dL (ref 3.6–5.1)
Alkaline phosphatase (APISO): 126 U/L (ref 37–153)
Bilirubin, Direct: 0.2 mg/dL (ref 0.0–0.2)
Globulin: 2.9 g/dL (calc) (ref 1.9–3.7)
Indirect Bilirubin: 0.5 mg/dL (calc) (ref 0.2–1.2)
Total Bilirubin: 0.7 mg/dL (ref 0.2–1.2)
Total Protein: 6.8 g/dL (ref 6.1–8.1)

## 2019-07-02 LAB — IGM: IgM, Serum: 231 mg/dL (ref 50–300)

## 2019-07-02 LAB — CBC
HCT: 37.1 % (ref 35.0–45.0)
Hemoglobin: 11.6 g/dL — ABNORMAL LOW (ref 11.7–15.5)
MCH: 24.4 pg — ABNORMAL LOW (ref 27.0–33.0)
MCHC: 31.3 g/dL — ABNORMAL LOW (ref 32.0–36.0)
MCV: 77.9 fL — ABNORMAL LOW (ref 80.0–100.0)
MPV: 11.5 fL (ref 7.5–12.5)
Platelets: 279 10*3/uL (ref 140–400)
RBC: 4.76 10*6/uL (ref 3.80–5.10)
RDW: 14.1 % (ref 11.0–15.0)
WBC: 7 10*3/uL (ref 3.8–10.8)

## 2019-07-04 ENCOUNTER — Ambulatory Visit (INDEPENDENT_AMBULATORY_CARE_PROVIDER_SITE_OTHER): Payer: PPO | Admitting: Internal Medicine

## 2019-07-04 ENCOUNTER — Encounter (INDEPENDENT_AMBULATORY_CARE_PROVIDER_SITE_OTHER): Payer: Self-pay | Admitting: Internal Medicine

## 2019-07-04 ENCOUNTER — Encounter: Payer: Self-pay | Admitting: Cardiology

## 2019-07-04 ENCOUNTER — Other Ambulatory Visit: Payer: Self-pay

## 2019-07-04 VITALS — BP 154/72 | HR 90 | Temp 98.3°F | Ht 62.0 in | Wt 176.9 lb

## 2019-07-04 DIAGNOSIS — D500 Iron deficiency anemia secondary to blood loss (chronic): Secondary | ICD-10-CM

## 2019-07-04 DIAGNOSIS — R748 Abnormal levels of other serum enzymes: Secondary | ICD-10-CM | POA: Insufficient documentation

## 2019-07-04 DIAGNOSIS — R7989 Other specified abnormal findings of blood chemistry: Secondary | ICD-10-CM | POA: Diagnosis not present

## 2019-07-04 DIAGNOSIS — D509 Iron deficiency anemia, unspecified: Secondary | ICD-10-CM | POA: Insufficient documentation

## 2019-07-04 DIAGNOSIS — K7400 Hepatic fibrosis, unspecified: Secondary | ICD-10-CM | POA: Insufficient documentation

## 2019-07-04 NOTE — Progress Notes (Signed)
Presenting complaint;  Follow-up for elevated transaminases and hepatic fibrosis. History of iron deficiency anemia due to GI bleed.  Database and subjective:  Patient is 78 year old Caucasian female who was evaluated for iron deficiency anemia and GI bleed back in December 2019.  She had EGD and colonoscopy followed by small bowel given capsule study.  She was felt to be losing blood from distal small bowel mucosa most likely due to NSAID.  She has been maintained on p.o. iron with normalization of her hemoglobin. She also has a history of elevated transaminases for at least 1 year.  Hepatitis B surface antigen and HCV antibodies were negative.  Hepatitis B surface antibody was positive implying immunity.  She had received hepatitis B vaccine in the past.  She had ultrasound with elastography in June 2020 revealing F2/F3 disease. Mitochondrial M2 antibody IgG was positive with a high titer of 114(normal less than 20).  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  On her last visit options were reviewed with the patient and she preferred to try the medication rather than having liver biopsy.  She was therefore begun on Urso 3 mg 3 times a day.   She reports having no side effects with this medication except single episode of constipation relieved with Senokot.  She is wondering if she can take Colace if she needs to.  She was told she can Colace if she needs to but do not take laxative. She feels heartburn is well controlled with pantoprazole.  She rarely takes metoclopramide for nausea.  She denied abdominal pain or pruritus.  She says she only has blood on the tissue with straining but no frank bleeding or rectal bleeding reported.  She has backed off on iron pill and now she is taking only 14 mg of elemental iron daily.  Current Medications: Outpatient Encounter Medications as of 07/04/2019  Medication Sig  . albuterol (PROAIR HFA) 108 (90 Base) MCG/ACT inhaler INHALE 2 PUFFS  EVERY SIX HOURS AS NEEDED FOR WHEEZING OR SHORTNESS OF BREATH (Patient taking differently: Inhale 1 puff into the lungs every 6 (six) hours as needed for wheezing or shortness of breath. )  . ALPRAZolam (XANAX) 0.25 MG tablet Take 0.5-1 tablets (0.125-0.25 mg total) by mouth at bedtime as needed for anxiety.  Marland Kitchen amLODipine (NORVASC) 10 MG tablet TAKE 1 TABLET BY MOUTH EVERY DAY  . aspirin 325 MG tablet Take 325 mg by mouth daily.  Marland Kitchen azelastine (ASTELIN) 0.1 % nasal spray Place 1 spray into both nostrils 2 (two) times daily. Use in each nostril as directed  . Carboxymethylcellulose Sodium (REFRESH LIQUIGEL) 1 % GEL Place 1 drop into both eyes 2 (two) times daily.   Marland Kitchen CINNAMON PO Take 1,000 mg by mouth daily as needed (for blood sugars).   . clopidogrel (PLAVIX) 75 MG tablet TAKE ONE TABLET BY MOUTH DAILY (PLEASE USE AUROBINDO BRAND, PT HD FOR ALLERGY TO REDDY). Hold for 5 days  . gabapentin (NEURONTIN) 100 MG capsule TAKE ONE CAPSULE BY MOUTH AT BEDTIME  . hydrochlorothiazide (MICROZIDE) 12.5 MG capsule TAKE ONE CAPSULE BY MOUTH EVERY DAY  . metoCLOPramide (REGLAN) 10 MG tablet TAKE ONE TABLET BY MOUTH DAILY AS NEEDED  . montelukast (SINGULAIR) 5 MG chewable tablet CHEW TWO TABLETS BY MOUTH AT BEDTIME  . pantoprazole (PROTONIX) 40 MG tablet Take 1 tablet (40 mg total) by mouth daily before breakfast.  . pediatric multivitamin-iron (POLY-VI-SOL WITH IRON) 15 MG chewable tablet Chew 1 tablet by mouth daily.  Marland Kitchen  sitaGLIPtin (JANUVIA) 100 MG tablet Take 1 tablet (100 mg total) by mouth daily.  . ursodiol (ACTIGALL) 300 MG capsule Take 1 capsule (300 mg total) by mouth 3 (three) times daily.  . Vitamin D, Ergocalciferol, (DRISDOL) 1.25 MG (50000 UT) CAPS capsule TAKE 1 CAPSULE BY MOUTH EVERY SEVEN DAYS  . traMADol (ULTRAM) 50 MG tablet Take 50 mg by mouth daily as needed. Usually 1/2 tab - one a week  . [DISCONTINUED] cyproheptadine (PERIACTIN) 4 MG tablet Take 2 mg by mouth daily as needed for allergies.   . [DISCONTINUED] predniSONE (STERAPRED UNI-PAK 21 TAB) 10 MG (21) TBPK tablet Use as directed (Patient not taking: Reported on 07/04/2019)  . [DISCONTINUED] ramipril (ALTACE) 10 MG capsule Take 10 mg by mouth daily.     No facility-administered encounter medications on file as of 07/04/2019.      Objective: Blood pressure (!) 154/72, pulse 90, temperature 98.3 F (36.8 C), temperature source Oral, height '5\' 2"'  (1.575 m), weight 176 lb 14.4 oz (80.2 kg). Patient is alert and in no acute distress. She is wearing facial mask. Conjunctiva is pink. Sclera is nonicteric Oropharyngeal mucosa is normal. No neck masses or thyromegaly noted. Cardiac exam with regular rhythm normal S1 and S2. No murmur or gallop noted. Lungs are clear to auscultation. Abdomen is full.  Abdomen is soft.  Spleen is nonpalpable.  Liver is easily palpable it is firm.  Left lobe is prominent. No LE edema or clubbing noted.  Labs/studies Results:  CBC Latest Ref Rng & Units 07/01/2019 04/08/2019 11/10/2018  WBC 3.8 - 10.8 Thousand/uL 7.0 5.5 6.6  Hemoglobin 11.7 - 15.5 g/dL 11.6(L) 12.0 12.4  Hematocrit 35.0 - 45.0 % 37.1 38.0 40.6  Platelets 140 - 400 Thousand/uL 279 291 275    CMP Latest Ref Rng & Units 07/01/2019 03/30/2019 01/20/2019  Glucose 65 - 99 mg/dL - 331(H) -  BUN 8 - 27 mg/dL - 9 -  Creatinine 0.57 - 1.00 mg/dL - 0.83 -  Sodium 134 - 144 mmol/L - 134 -  Potassium 3.5 - 5.2 mmol/L - 4.0 -  Chloride 96 - 106 mmol/L - 94(L) -  CO2 20 - 29 mmol/L - 23 -  Calcium 8.7 - 10.3 mg/dL - 10.2 -  Total Protein 6.1 - 8.1 g/dL 6.8 6.6 7.4  Total Bilirubin 0.2 - 1.2 mg/dL 0.7 0.5 0.9  Alkaline Phos 39 - 117 IU/L - 117 -  AST 10 - 35 U/L 39(H) 54(H) 57(H)  ALT 6 - 29 U/L 34(H) 48(H) 55(H)    Hepatic Function Latest Ref Rng & Units 07/01/2019 03/30/2019 01/20/2019  Total Protein 6.1 - 8.1 g/dL 6.8 6.6 7.4  Albumin 3.7 - 4.7 g/dL - 4.4 -  AST 10 - 35 U/L 39(H) 54(H) 57(H)  ALT 6 - 29 U/L 34(H) 48(H) 55(H)  Alk  Phosphatase 39 - 117 IU/L - 117 -  Total Bilirubin 0.2 - 1.2 mg/dL 0.7 0.5 0.9  Bilirubin, Direct 0.0 - 0.2 mg/dL 0.2 - 0.3(H)     Serum IgM is normal at 231.  Assessment:  #1.  Hepatic fibrosis and positive M2 antibody.  She had F2/F3 disease on elastography of June 2020.  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  Her alkaline phosphatase is normal which is not typically what we see.  She has been on Urso for 3 months.  Her transaminases are trending down which is reassuring.  I do not see any downside to using Urso in the setting.  #2.  History of iron deficiency anemia secondary to blood loss from small bowel.  Patient remains on full dose aspirin.  She is also on clopidogrel.  Hemoglobin is still at a respectable range but trending downwards.  If feasible aspirin dose should be reduced but I would leave it up to her neurologist.  She has an appointment in December 2020.  #3.  GERD.  Symptoms well controlled with PPI.   Plan:  Patient advised to take iron pill twice daily. Continue Urso at current dose. Patient will have CBC serum iron TIBC ferritin and LFTs in 3 months. Patient advised to talk with her neurologist if aspirin dose could be decreased or halved. Office visit in 6 months.

## 2019-07-04 NOTE — Patient Instructions (Signed)
Take 2 iron pills a day. Please check with your neurologist at your next office visit if aspirin dose could be reduced. Next blood work in 3 months. Office visit in 6 months.

## 2019-07-06 ENCOUNTER — Ambulatory Visit: Payer: PPO | Admitting: Family Medicine

## 2019-07-11 ENCOUNTER — Ambulatory Visit (INDEPENDENT_AMBULATORY_CARE_PROVIDER_SITE_OTHER): Payer: PPO | Admitting: Family Medicine

## 2019-07-11 ENCOUNTER — Encounter: Payer: Self-pay | Admitting: Family Medicine

## 2019-07-11 ENCOUNTER — Telehealth: Payer: Self-pay | Admitting: Student

## 2019-07-11 DIAGNOSIS — J329 Chronic sinusitis, unspecified: Secondary | ICD-10-CM | POA: Diagnosis not present

## 2019-07-11 DIAGNOSIS — J40 Bronchitis, not specified as acute or chronic: Secondary | ICD-10-CM

## 2019-07-11 MED ORDER — LEVOFLOXACIN 250 MG PO TABS: 250.0000 mg | ORAL_TABLET | Freq: Every day | ORAL | 0 refills | Status: DC

## 2019-07-11 NOTE — Progress Notes (Signed)
Subjective:    Patient ID: Elizabeth [name], female    DOB: Sep 20, 1940, 78 y.o.   MRN: [id number]   HPI: Elizabeth [name] is a 78 y.o. female presenting for runny nose, congestion, cough, PND. Tried aspirin and rhinocort and chlortrimeton. Some sore throat with swallowing. No fever. Temp. 97.1 A little HA and mild earache bilaterally, more on the left. Denies dyspnea. Pt. Masks and gloves to go out. Went out to see Dr. Shonna Chock. One week ago. Goes to the grocery store.    Depression screen [facility] 2/9 03/30/2019 02/03/2019 11/10/2018 09/20/2018 09/06/2018  Decreased Interest 0 1 1 0 0  Down, Depressed, Hopeless 0 0 0 0 0  PHQ - 2 Score 0 1 1 0 0  Altered sleeping 0 - - 0 0  Tired, decreased energy 2 - - 0 0  Change in appetite 0 - - 0 0  Feeling bad or failure about yourself  0 - - 0 0  Trouble concentrating 1 - - 0 0  Moving slowly or fidgety/restless 1 - - 0 0  Suicidal thoughts 0 - - 0 0  PHQ-9 Score 4 - - 0 0  Difficult doing work/chores - - - Not difficult at all Not difficult at all  Some recent data might be hidden     Relevant past medical, surgical, family and social history reviewed and updated as indicated.  Interim medical history since our last visit reviewed. Allergies and medications reviewed and updated.  ROS:  Review of Systems  Constitutional: Negative for activity change, appetite change, chills and fever.  HENT: Positive for congestion, postnasal drip, rhinorrhea and sinus pressure. Negative for ear discharge, ear pain, hearing loss, nosebleeds, sneezing and trouble swallowing.   Respiratory: Positive for cough. Negative for chest tightness and shortness of breath.   Cardiovascular: Negative for chest pain and palpitations.  Skin: Negative for rash.     Social History   Tobacco Use  Smoking Status Former Smoker  . Packs/day: 1.00  . Years: 20.00  . Pack years: 20.00  . Types: Cigarettes  . Start date: 01/15/1983  . Quit date: 05/03/2019  . Years since quitting: 0.1   Smokeless Tobacco Never Used       Objective:     Wt Readings from Last 3 Encounters:  07/04/19 176 lb 14.4 oz (80.2 kg)  03/30/19 178 lb (80.7 kg)  03/28/19 178 lb 8 oz (81 kg)     Exam deferred. Pt. Harboring due to COVID 19. Phone visit performed.   Assessment & Plan:   1. Sinobronchitis     Meds ordered this encounter  Medications  . levofloxacin (LEVAQUIN) 250 MG tablet    Sig: Take 1 tablet (250 mg total) by mouth daily.    Dispense:  10 tablet    Refill:  0    No orders of the defined types were placed in this encounter.     Diagnoses and all orders for this visit:  Sinobronchitis  Other orders -     levofloxacin (LEVAQUIN) 250 MG tablet; Take 1 tablet (250 mg total) by mouth daily.    Virtual Visit via telephone Note  I discussed the limitations, risks, security and privacy concerns of performing an evaluation and management service by telephone and the availability of in person appointments. The patient was identified with two identifiers. Pt.expressed understanding and agreed to proceed. Pt. Is at home. Dr. Livia Snellen is in his office.  Follow Up Instructions:   I discussed the  assessment and treatment plan with the patient. The patient was provided an opportunity to ask questions and all were answered. The patient agreed with the plan and demonstrated an understanding of the instructions.   The patient was advised to call back or seek an in-person evaluation if the symptoms worsen or if the condition fails to improve as anticipated.   Total minutes including chart review and phone contact time: 12   Follow up plan: Return if symptoms worsen or fail to improve.  Claretta Fraise, MD [place]

## 2019-07-11 NOTE — Telephone Encounter (Signed)
NIR.  Received call from patient requesting refill for Plavix. Called [facility] in [place], [place] [phone]) at [phone] to fill prescription- Plavix 75 mg tablets, take one tablet by mouth once daily, dispense 30 tablets with 3 refills.   Bea Graff Louk, PA-C 07/11/2019, 8:14 AM

## 2019-07-13 ENCOUNTER — Other Ambulatory Visit: Payer: Self-pay

## 2019-07-13 DIAGNOSIS — Z20822 Contact with and (suspected) exposure to covid-19: Secondary | ICD-10-CM

## 2019-07-14 ENCOUNTER — Telehealth: Payer: Self-pay

## 2019-07-14 LAB — NOVEL CORONAVIRUS, NAA: SARS-CoV-2, NAA: NOT DETECTED

## 2019-07-14 NOTE — Telephone Encounter (Signed)
Patient called for her COVID-19 test result.  She was informed that her test was still active and had not resulted.  She will call back tomorrow.

## 2019-07-15 ENCOUNTER — Telehealth: Payer: Self-pay | Admitting: Family Medicine

## 2019-07-15 NOTE — Telephone Encounter (Signed)
Negative COVID results given. Patient results "NOT Detected." Caller expressed understanding. ° °

## 2019-07-17 ENCOUNTER — Other Ambulatory Visit: Payer: Self-pay | Admitting: Family Medicine

## 2019-07-17 DIAGNOSIS — E1142 Type 2 diabetes mellitus with diabetic polyneuropathy: Secondary | ICD-10-CM

## 2019-07-18 ENCOUNTER — Other Ambulatory Visit: Payer: Self-pay | Admitting: Family Medicine

## 2019-07-19 ENCOUNTER — Other Ambulatory Visit: Payer: Self-pay | Admitting: Family Medicine

## 2019-07-21 ENCOUNTER — Telehealth: Payer: Self-pay | Admitting: Family Medicine

## 2019-07-21 NOTE — Chronic Care Management (AMB) (Signed)
Chronic Care Management   Note  07/21/2019 Name: Elizabeth [name] MRN: [id number] DOB: Feb 19, 1941  Elizabeth [name] is a 78 y.o. year old female who is a primary care patient of Janora Norlander, DO. I reached out to [facility] by phone today in response to a referral sent by Elizabeth [name]'s health plan.     Ms. Calef was given information about Chronic Care Management services today including:  1. CCM service includes personalized support from designated clinical staff supervised by her physician, including individualized plan of care and coordination with other care providers 2. 24/7 contact phone numbers for assistance for urgent and routine care needs. 3. Service will only be billed when office clinical staff spend 20 minutes or more in a month to coordinate care. 4. Only one practitioner may furnish and bill the service in a calendar month. 5. The patient may stop CCM services at any time (effective at the end of the month) by phone call to the office staff. 6. The patient will be responsible for cost sharing (co-pay) of up to 20% of the service fee (after annual deductible is met).  Patient did not agree to enrollment in care management services and does not wish to consider at this time.  Follow up plan: The patient has been provided with contact information for the chronic care management team and has been advised to call with any health related questions or concerns.   [place], [place] [address] Direct Dial: [email or web address]  Website: [email or web address]

## 2019-07-25 ENCOUNTER — Other Ambulatory Visit: Payer: Self-pay | Admitting: Family Medicine

## 2019-07-28 ENCOUNTER — Telehealth (HOSPITAL_COMMUNITY): Payer: Self-pay

## 2019-07-28 NOTE — Telephone Encounter (Signed)
Called to schedule mri. Pt wasn't home. Husband will give her the message to call back. AW

## 2019-07-29 ENCOUNTER — Other Ambulatory Visit (HOSPITAL_COMMUNITY): Payer: Self-pay | Admitting: Interventional Radiology

## 2019-07-29 DIAGNOSIS — I771 Stricture of artery: Secondary | ICD-10-CM

## 2019-08-18 ENCOUNTER — Encounter (HOSPITAL_COMMUNITY): Payer: Self-pay

## 2019-08-18 ENCOUNTER — Ambulatory Visit (HOSPITAL_COMMUNITY)
Admission: RE | Admit: 2019-08-18 | Discharge: 2019-08-18 | Disposition: A | Discharge status: Home / Self Care | Payer: PPO | Source: Ambulatory Visit | Attending: Interventional Radiology | Admitting: Interventional Radiology

## 2019-08-18 ENCOUNTER — Other Ambulatory Visit: Payer: Self-pay

## 2019-08-18 DIAGNOSIS — I771 Stricture of artery: Secondary | ICD-10-CM

## 2019-08-18 DIAGNOSIS — Z9889 Other specified postprocedural states: Secondary | ICD-10-CM | POA: Insufficient documentation

## 2019-08-18 DIAGNOSIS — I671 Cerebral aneurysm, nonruptured: Secondary | ICD-10-CM | POA: Diagnosis not present

## 2019-08-19 ENCOUNTER — Telehealth: Payer: Self-pay | Admitting: Student

## 2019-08-19 NOTE — Telephone Encounter (Signed)
Reviewed MR/A Head with Dr. Estanislado Pandy who recommends repeat diagnostic arteriogram in 6 months.  Discussed with patient who denies new symptoms-- specifically no headaches, blurry vision, trouble walking, dizziness.  She is agreeable to follow-up study in 6 months.  Info to be forwarded to schedulers.  Patient understands to call if there are changes in her symptoms.   Brynda Greathouse, MS RD PA-C 3:09 PM

## 2019-08-22 ENCOUNTER — Other Ambulatory Visit (INDEPENDENT_AMBULATORY_CARE_PROVIDER_SITE_OTHER): Payer: Self-pay | Admitting: *Deleted

## 2019-08-22 DIAGNOSIS — R7989 Other specified abnormal findings of blood chemistry: Secondary | ICD-10-CM

## 2019-08-22 DIAGNOSIS — D500 Iron deficiency anemia secondary to blood loss (chronic): Secondary | ICD-10-CM

## 2019-09-05 ENCOUNTER — Other Ambulatory Visit: Payer: Self-pay | Admitting: Family Medicine

## 2019-09-12 ENCOUNTER — Other Ambulatory Visit: Payer: Self-pay | Admitting: Family Medicine

## 2019-09-12 DIAGNOSIS — E1142 Type 2 diabetes mellitus with diabetic polyneuropathy: Secondary | ICD-10-CM

## 2019-09-15 ENCOUNTER — Other Ambulatory Visit: Payer: Self-pay | Admitting: Family Medicine

## 2019-09-19 ENCOUNTER — Other Ambulatory Visit: Payer: Self-pay | Admitting: Family Medicine

## 2019-09-22 ENCOUNTER — Encounter: Payer: Self-pay | Admitting: Family Medicine

## 2019-09-22 ENCOUNTER — Ambulatory Visit (INDEPENDENT_AMBULATORY_CARE_PROVIDER_SITE_OTHER): Payer: PPO | Admitting: Family Medicine

## 2019-09-22 ENCOUNTER — Other Ambulatory Visit: Payer: Self-pay

## 2019-09-22 DIAGNOSIS — J329 Chronic sinusitis, unspecified: Secondary | ICD-10-CM | POA: Diagnosis not present

## 2019-09-22 MED ORDER — FLUTICASONE PROPIONATE 50 MCG/ACT NA SUSP: 2.0000 | Freq: Every day | NASAL | 6 refills | Status: DC

## 2019-09-22 MED ORDER — DOXYCYCLINE HYCLATE 100 MG PO TABS: 100.0000 mg | ORAL_TABLET | Freq: Two times a day (BID) | ORAL | 0 refills | Status: AC

## 2019-09-22 NOTE — Progress Notes (Signed)
Virtual Visit via telephone Note Due to COVID-19 pandemic this visit was conducted virtually. This visit type was conducted due to national recommendations for restrictions regarding the COVID-19 Pandemic (e.g. social distancing, sheltering in place) in an effort to limit this patient's exposure and mitigate transmission in our community. All issues noted in this document were discussed and addressed.  A physical exam was not performed with this format.   I connected with Elizabeth [name] on 09/22/2019 at 1530 by telephone and verified that I am speaking with the correct person using two identifiers. Elizabeth [name] is currently located at home and family is currently with them during visit. The provider, Monia Pouch, FNP is located in their office at time of visit.  I discussed the limitations, risks, security and privacy concerns of performing an evaluation and management service by telephone and the availability of in person appointments. I also discussed with the patient that there may be a patient responsible charge related to this service. The patient expressed understanding and agreed to proceed.  Subjective:  Patient ID: Elizabeth [name], female    DOB: September 15, 1940, 79 y.o.   MRN: [id number]  Chief Complaint:  Sinus Problem   HPI: Elizabeth [name] is a 79 y.o. female presenting on 09/22/2019 for Sinus Problem   Pt reports frontal and maxillary sinus pressure, postnasal drainage, congestion, headache, and fatigue. No reported fevers but has had chills. Ongoing for a week.   Sinus Problem This is a new problem. The current episode started in the past 7 days. The problem has been gradually worsening since onset. Her pain is at a severity of 5/10. The pain is moderate. Associated symptoms include chills, congestion, coughing, ear pain, headaches and sinus pressure. Pertinent negatives include no diaphoresis, hoarse voice, neck pain, shortness of breath, sneezing, sore throat or swollen glands. Past  treatments include oral decongestants and saline sprays. The treatment provided no relief.     Relevant past medical, surgical, family, and social history reviewed and updated as indicated.  Allergies and medications reviewed and updated.   Past Medical History:  Diagnosis Date  . Absolute anemia 07/28/2018  . Active smoker   . Allergy   . Anxiety    Takes Xanax for anxiety  . Arthritis   . Asthma   . Cataract   . Chronic airway obstruction, not elsewhere classified   . Clotting disorder ([place])    stoke   . Depression   . Diarrhea   . Diverticulosis of colon (without mention of hemorrhage)   . Esophageal reflux   . Family history of malignant neoplasm of gastrointestinal tract   . GI bleed   . Headache(784.0)    Irregular  . Neuromuscular disorder ([facility])    numbness is left hand and left elbow  . Obesity, unspecified   . Other and unspecified hyperlipidemia   . Personal history of colonic polyps   . Stricture and stenosis of esophagus   . [place]   . Thyroid disease   . Type II or unspecified type diabetes mellitus without mention of complication, not stated as uncontrolled   . Unspecified asthma(493.90)   . Unspecified essential hypertension     Past Surgical History:  Procedure Laterality Date  . APPENDECTOMY    . BACK SURGERY     Spinal surgery  . BRAIN SURGERY     2013 - stoke ( fall in 2010)   . CARDIAC CATHETERIZATION  2006   LAD: 30%, RCA : 20%, normal  EF, elevated LVEDP  . CARDIAC CATHETERIZATION    . CEREBRAL ANGIOGRAM  12/20/2015  . COLONOSCOPY N/A 08/09/2018   Procedure: COLONOSCOPY;  Surgeon: Rogene Houston, MD;  [facility];  Service: Endoscopy;  Laterality: N/A;  1:55  . ESOPHAGEAL DILATION N/A 08/09/2018   Procedure: ESOPHAGEAL DILATION;  Surgeon: Rogene Houston, MD;  [facility];  Service: Endoscopy;  Laterality: N/A;  . ESOPHAGOGASTRODUODENOSCOPY N/A 08/09/2018   Procedure: ESOPHAGOGASTRODUODENOSCOPY (EGD);   Surgeon: Rogene Houston, MD;  [facility];  Service: Endoscopy;  Laterality: N/A;  . GIVENS CAPSULE STUDY N/A 08/25/2018   Procedure: GIVENS CAPSULE STUDY;  Surgeon: Rogene Houston, MD;  [facility];  Service: Endoscopy;  Laterality: N/A;  . IR ANGIO INTRA EXTRACRAN SEL COM CAROTID INNOMINATE BILAT MOD SED  01/14/2018  . IR ANGIO INTRA EXTRACRAN SEL COM CAROTID INNOMINATE BILAT MOD SED  05/11/2018  . IR ANGIO VERTEBRAL SEL SUBCLAVIAN INNOMINATE UNI L MOD SED  01/14/2018  . IR ANGIO VERTEBRAL SEL SUBCLAVIAN INNOMINATE UNI L MOD SED  05/11/2018  . IR ANGIO VERTEBRAL SEL VERTEBRAL UNI R MOD SED  01/14/2018  . IR ANGIO VERTEBRAL SEL VERTEBRAL UNI R MOD SED  05/11/2018  . KNEE ARTHROSCOPY     left  . LUMBAR DISC SURGERY    . POLYPECTOMY  08/09/2018   Procedure: POLYPECTOMY;  Surgeon: Rogene Houston, MD;  [facility];  Service: Endoscopy;;  ascending colon (CS x 2, HS x5) hepatic flexure (HSx1), recto-sigmoid (HSx1)  . TONSILLECTOMY    . TOTAL ABDOMINAL HYSTERECTOMY     menopause/bleeding    Social History   Socioeconomic History  . Marital status: Married    Spouse name: [name]  . Number of children: 0  . Years of education: 16  . Highest education level: Bachelor's degree (e.g., BA, AB, BS)  Occupational History  . 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 Smoking status: Former Smoker    Packs/day: 1.00    Years: 20.00    Pack years: 20.00    Types: Cigarettes    Start date: 01/15/1983    Quit date: 05/03/2019    Years since quitting: 0.3  . Smokeless tobacco: Never Used  Substance and Sexual Activity  . Alcohol use: No    Alcohol/week: 0.0 standard drinks  . Drug use: No  . Sexual activity: Not Currently  Other Topics Concern  . Not on [name]  Social History Narrative   Married [name]   Limited activity due to back pain   Social Determinants of Health   Financial Resource Strain:   . Difficulty of Paying  Living Expenses: Not on [name]  Food Insecurity:   . Worried About Charity fundraiser in the Last Year: Not on [name]  . Ran Out of Food in the Last Year: Not on [name]  Transportation Needs:   . Lack of Transportation (Medical): Not on [name]  . Lack of Transportation (Non-Medical): Not on [name]  Physical Activity:   . Days of Exercise per Week: Not on [name]  . Minutes of Exercise per Session: Not on [name]  Stress:   . Feeling of Stress : Not on [name]  Social Connections:   . Frequency of Communication with Friends and Family: Not on [name]  . Frequency of Social Gatherings with Friends and Family: Not on [name]  . Attends Religious Services: Not on [name]  .  Active Member of Clubs or Organizations: Not on [name]  . Attends Archivist Meetings: Not on [name]  . Marital Status: Not on [name]  Intimate Partner Violence:   . Fear of Current or Ex-Partner: Not on [name]  . Emotionally Abused: Not on [name]  . Physically Abused: Not on [name]  . Sexually Abused: Not on [name]    Outpatient Encounter Medications as of 09/22/2019  Medication Sig  . albuterol (VENTOLIN HFA) 108 (90 Base) MCG/ACT inhaler INHALE 2 PUFFS EVERY SIX HOURS AS NEEDED FOR WHEEZING OR SHORTNESS OF BREATH  . ALPRAZolam (XANAX) 0.25 MG tablet Take 0.5-1 tablets (0.125-0.25 mg total) by mouth at bedtime as needed for anxiety.  Marland Kitchen amLODipine (NORVASC) 10 MG tablet TAKE 1 TABLET BY MOUTH EVERY DAY  . aspirin 325 MG tablet Take 325 mg by mouth daily.  Marland Kitchen azelastine (ASTELIN) 0.1 % nasal spray Place 1 spray into both nostrils 2 (two) times daily. Use in each nostril as directed  . Carboxymethylcellulose Sodium (REFRESH LIQUIGEL) 1 % GEL Place 1 drop into both eyes 2 (two) times daily.   Marland Kitchen CINNAMON PO Take 1,000 mg by mouth daily as needed (for blood sugars).   . clopidogrel (PLAVIX) 75 MG tablet TAKE ONE TABLET BY MOUTH DAILY (PLEASE USE AUROBINDO BRAND, PT HD FOR ALLERGY TO REDDY). Hold for 5 days  . doxycycline (VIBRA-TABS) 100 MG tablet  Take 1 tablet (100 mg total) by mouth 2 (two) times daily for 10 days. 1 po bid  . fluticasone (FLONASE) 50 MCG/ACT nasal spray Place 2 sprays into both nostrils daily.  Marland Kitchen gabapentin (NEURONTIN) 100 MG capsule TAKE ONE CAPSULE BY MOUTH AT BEDTIME  . hydrochlorothiazide (MICROZIDE) 12.5 MG capsule TAKE ONE CAPSULE BY MOUTH EVERY DAY  . JANUVIA 100 MG tablet TAKE 1 TABLET BY MOUTH DAILY  . levofloxacin (LEVAQUIN) 250 MG tablet Take 1 tablet (250 mg total) by mouth daily.  . metoCLOPramide (REGLAN) 10 MG tablet TAKE ONE TABLET BY MOUTH DAILY AS NEEDED  . montelukast (SINGULAIR) 5 MG chewable tablet CHEW TWO TABLETS BY MOUTH AT BEDTIME  . pantoprazole (PROTONIX) 40 MG tablet TAKE 1 TABLET BY MOUTH TWICE DAILY  . pediatric multivitamin-iron (POLY-VI-SOL WITH IRON) 15 MG chewable tablet Chew 1 tablet by mouth 2 (two) times daily.  . ursodiol (ACTIGALL) 300 MG capsule Take 1 capsule (300 mg total) by mouth 3 (three) times daily.  . Vitamin D, Ergocalciferol, (DRISDOL) 1.25 MG (50000 UT) CAPS capsule TAKE 1 CAPSULE BY MOUTH EVERY SEVEN DAYS  . [DISCONTINUED] ramipril (ALTACE) 10 MG capsule Take 10 mg by mouth daily.     No facility-administered encounter medications on [name] as of 09/22/2019.    Allergies  Allergen Reactions  . Banana Anaphylaxis  . Codeine Nausea And Vomiting and Other (See Comments)    PROJECTILE VOMITING  . Stevia [Stevioside] Swelling and Other (See Comments)    Face, tongue, and eye swelling   . Actos [Pioglitazone] Swelling  . Benicar [Olmesartan Medoxomil] Other (See Comments)    Feel bad  . Chantix [Varenicline] Other (See Comments)    "Worked opposite."  . Diovan [Valsartan] Other (See Comments)    Feel bad  . Lipitor [Atorvastatin] Other (See Comments)    REACTION:  Joint pain  . Spinach Other (See Comments)    Stomach problems  . Vicodin [Hydrocodone-Acetaminophen] Nausea And Vomiting  . Zocor [Simvastatin] Other (See Comments)    REACTION:  "made liver function  incorrectly"  . Ace Inhibitors Cough  .  Celebrex [Celecoxib] Rash  . Latex Rash and Other (See Comments)    REACTION: red rash  . Metformin And Related     GI - upset   . Penicillins Rash and Other (See Comments)    Has patient had a PCN reaction causing immediate rash, facial/tongue/throat swelling, SOB or lightheadedness with hypotension: No Has patient had a PCN reaction causing severe rash involving mucus membranes or skin necrosis: No Has patient had a PCN reaction that required hospitalization: No- MD office Has patient had a PCN reaction occurring within the last 10 years: No If all of the above answers are "NO", then may proceed with Cephalosporin use.   . Sulfonamide Derivatives Rash    Review of Systems  Constitutional: Positive for chills and fatigue. Negative for activity change, appetite change, diaphoresis, fever and unexpected weight change.  HENT: Positive for congestion, ear pain, postnasal drip, rhinorrhea, sinus pressure and sinus pain. Negative for hoarse voice, sneezing and sore throat.   Eyes: Negative.   Respiratory: Positive for cough. Negative for chest tightness and shortness of breath.   Cardiovascular: Negative for chest pain, palpitations and leg swelling.  Gastrointestinal: Negative for blood in stool, constipation, diarrhea, nausea and vomiting.  Endocrine: Negative.   Genitourinary: Negative for difficulty urinating, dysuria and frequency.  Musculoskeletal: Negative for arthralgias, myalgias and neck pain.  Skin: Negative.   Allergic/Immunologic: Negative.   Neurological: Positive for headaches. Negative for dizziness, tremors, seizures, syncope, facial asymmetry, speech difficulty, weakness, light-headedness and numbness.  Hematological: Negative.   Psychiatric/Behavioral: Negative for confusion, hallucinations, sleep disturbance and suicidal ideas.  All other systems reviewed and are negative.        Observations/Objective: No vital signs or  physical exam, this was a telephone or virtual health encounter.  Pt alert and oriented, answers all questions appropriately, and able to speak in full sentences.    Assessment and Plan: Jocelyn was seen today for sinus problem.  Diagnoses and all orders for this visit:  Recurrent sinusitis Recurrent sinusitis. Has failed symptomatic care. Will initiate below and place referral to ENT due to recurrence of symptoms. Report any new, worsening, or persistent symptoms.  -     Ambulatory referral to ENT -     fluticasone (FLONASE) 50 MCG/ACT nasal spray; Place 2 sprays into both nostrils daily. -     doxycycline (VIBRA-TABS) 100 MG tablet; Take 1 tablet (100 mg total) by mouth 2 (two) times daily for 10 days. 1 po bid     Follow Up Instructions: Return if symptoms worsen or fail to improve.    I discussed the assessment and treatment plan with the patient. The patient was provided an opportunity to ask questions and all were answered. The patient agreed with the plan and demonstrated an understanding of the instructions.   The patient was advised to call back or seek an in-person evaluation if the symptoms worsen or if the condition fails to improve as anticipated.  The above assessment and management plan was discussed with the patient. The patient verbalized understanding of and has agreed to the management plan. Patient is aware to call the clinic if they develop any new symptoms or if symptoms persist or worsen. Patient is aware when to return to the clinic for a follow-up visit. Patient educated on when it is appropriate to go to the emergency department.    I provided 15 minutes of non-face-to-face time during this encounter. The call started at 1530. The call ended at 1545. The other  time was used for coordination of care.    Monia Pouch, FNP-C [facility] [address] [place], [place] [address] [phone] 09/22/2019

## 2019-09-30 ENCOUNTER — Other Ambulatory Visit: Payer: Self-pay

## 2019-10-03 ENCOUNTER — Other Ambulatory Visit: Payer: Self-pay

## 2019-10-03 ENCOUNTER — Ambulatory Visit (INDEPENDENT_AMBULATORY_CARE_PROVIDER_SITE_OTHER): Payer: PPO | Admitting: Family Medicine

## 2019-10-03 ENCOUNTER — Encounter: Payer: Self-pay | Admitting: Family Medicine

## 2019-10-03 VITALS — BP 129/75 | HR 90 | Temp 96.8°F | Ht 62.0 in | Wt 171.0 lb

## 2019-10-03 DIAGNOSIS — I152 Hypertension secondary to endocrine disorders: Secondary | ICD-10-CM

## 2019-10-03 DIAGNOSIS — R7989 Other specified abnormal findings of blood chemistry: Secondary | ICD-10-CM | POA: Diagnosis not present

## 2019-10-03 DIAGNOSIS — Z789 Other specified health status: Secondary | ICD-10-CM | POA: Diagnosis not present

## 2019-10-03 DIAGNOSIS — E1159 Type 2 diabetes mellitus with other circulatory complications: Secondary | ICD-10-CM | POA: Diagnosis not present

## 2019-10-03 DIAGNOSIS — E785 Hyperlipidemia, unspecified: Secondary | ICD-10-CM

## 2019-10-03 DIAGNOSIS — E1169 Type 2 diabetes mellitus with other specified complication: Secondary | ICD-10-CM | POA: Diagnosis not present

## 2019-10-03 DIAGNOSIS — D500 Iron deficiency anemia secondary to blood loss (chronic): Secondary | ICD-10-CM

## 2019-10-03 DIAGNOSIS — I10 Essential (primary) hypertension: Secondary | ICD-10-CM

## 2019-10-03 DIAGNOSIS — E1165 Type 2 diabetes mellitus with hyperglycemia: Secondary | ICD-10-CM | POA: Diagnosis not present

## 2019-10-03 LAB — BAYER DCA HB A1C WAIVED: HB A1C (BAYER DCA - WAIVED): 11.5 % — ABNORMAL HIGH (ref <7.0)

## 2019-10-03 NOTE — Progress Notes (Signed)
Subjective: CC: DM2, HTN, HLD PCP: Janora Norlander, DO JC:[id number] Elizabeth [name] is a 79 y.o. female presenting to clinic today for:  1. Type 2 Diabetes w/ HTN, HLD w/ statin intolerance:  History:  Statins discontinued due to cirrhosis of the liver and persistently elevated LFTs.  Patient reports FBG 100-110. She reports compliance with Januvia 100mg  daily (this was increased last visit), Norvasc 10 mg daily but discontinued hydrochlorothiazide 12.5 mg.   Denies any visual disturbance, chest pain, shortness of breath.  She has been working on a keto diet and a liver healthy diet.  Last eye exam: Needs, sees Dr. Radford Pax Last foot exam: Up-to-date Last A1c:  Lab Results  Component Value Date   HGBA1C 8.8 (H) 03/30/2019   Nephropathy screen indicated?:  Needs Last flu, zoster and/or pneumovax: PNA 2/2 Immunization History  Administered Date(Elizabeth) Administered  . Influenza Whole 06/08/2010  . Influenza,inj,Quad PF,6+ Mos 07/07/2013, 07/06/2014, 07/17/2017  . Influenza-Unspecified 08/05/2016  . Pneumococcal Conjugate-13 11/25/2013  . Pneumococcal Polysaccharide-23 09/09/2007    ROS: Per HPI  Allergies  Allergen Reactions  . Banana Anaphylaxis  . Codeine Nausea And Vomiting and Other (See Comments)    PROJECTILE VOMITING  . Stevia [Stevioside] Swelling and Other (See Comments)    Face, tongue, and eye swelling   . Actos [Pioglitazone] Swelling  . Benicar [Olmesartan Medoxomil] Other (See Comments)    Feel bad  . Chantix [Varenicline] Other (See Comments)    "Worked opposite."  . Diovan [Valsartan] Other (See Comments)    Feel bad  . Lipitor [Atorvastatin] Other (See Comments)    REACTION:  Joint pain  . Spinach Other (See Comments)    Stomach problems  . Vicodin [Hydrocodone-Acetaminophen] Nausea And Vomiting  . Zocor [Simvastatin] Other (See Comments)    REACTION:  "made liver function incorrectly"  . Ace Inhibitors Cough  . Celebrex [Celecoxib] Rash  . Latex Rash and  Other (See Comments)    REACTION: red rash  . Metformin And Related     GI - upset   . Penicillins Rash and Other (See Comments)    Has patient had a PCN reaction causing immediate rash, facial/tongue/throat swelling, SOB or lightheadedness with hypotension: No Has patient had a PCN reaction causing severe rash involving mucus membranes or skin necrosis: No Has patient had a PCN reaction that required hospitalization: No- MD office Has patient had a PCN reaction occurring within the last 10 years: No If all of the above answers are "NO", then may proceed with Cephalosporin use.   . Sulfonamide Derivatives Rash   Past Medical History:  Diagnosis Date  . Absolute anemia 07/28/2018  . Active smoker   . Allergy   . Anxiety    Takes Xanax for anxiety  . Arthritis   . Asthma   . Cataract   . Chronic airway obstruction, not elsewhere classified   . Clotting disorder ([place])    stoke   . Depression   . Diarrhea   . Diverticulosis of colon (without mention of hemorrhage)   . Esophageal reflux   . Family history of malignant neoplasm of gastrointestinal tract   . GI bleed   . Headache(784.0)    Irregular  . Neuromuscular disorder (HCC)    numbness is left hand and left elbow  . Obesity, unspecified   . Other and unspecified hyperlipidemia   . Personal history of colonic polyps   . Stricture and stenosis of esophagus   . Stroke ([place])   . Thyroid  disease   . Type II or unspecified type diabetes mellitus without mention of complication, not stated as uncontrolled   . Unspecified asthma(493.90)   . Unspecified essential hypertension     Current Outpatient Medications:  .  albuterol (VENTOLIN HFA) 108 (90 Base) MCG/ACT inhaler, INHALE 2 PUFFS EVERY SIX HOURS AS NEEDED FOR WHEEZING OR SHORTNESS OF BREATH, Disp: 8.5 g, Rfl: 2 .  amLODipine (NORVASC) 10 MG tablet, TAKE 1 TABLET BY MOUTH EVERY DAY, Disp: 90 tablet, Rfl: 0 .  aspirin 325 MG tablet, Take 325 mg by mouth daily., Disp: ,  Rfl:  .  azelastine (ASTELIN) 0.1 % nasal spray, Place 1 spray into both nostrils 2 (two) times daily. Use in each nostril as directed, Disp: 30 mL, Rfl: 12 .  Carboxymethylcellulose Sodium (REFRESH LIQUIGEL) 1 % GEL, Place 1 drop into both eyes 2 (two) times daily. , Disp: , Rfl:  .  CINNAMON PO, Take 1,000 mg by mouth daily as needed (for blood sugars). , Disp: , Rfl:  .  clopidogrel (PLAVIX) 75 MG tablet, TAKE ONE TABLET BY MOUTH DAILY (PLEASE USE AUROBINDO BRAND, PT HD FOR ALLERGY TO REDDY). Hold for 5 days, Disp: 90 tablet, Rfl: 1 .  fluticasone (FLONASE) 50 MCG/ACT nasal spray, Place 2 sprays into both nostrils daily., Disp: 16 g, Rfl: 6 .  gabapentin (NEURONTIN) 100 MG capsule, TAKE ONE CAPSULE BY MOUTH AT BEDTIME, Disp: 90 capsule, Rfl: 0 .  JANUVIA 100 MG tablet, TAKE 1 TABLET BY MOUTH DAILY, Disp: 30 tablet, Rfl: 1 .  montelukast (SINGULAIR) 5 MG chewable tablet, CHEW TWO TABLETS BY MOUTH AT BEDTIME, Disp: 60 tablet, Rfl: 2 .  pantoprazole (PROTONIX) 40 MG tablet, TAKE 1 TABLET BY MOUTH TWICE DAILY, Disp: 90 tablet, Rfl: 2 .  pediatric multivitamin-iron (POLY-VI-SOL WITH IRON) 15 MG chewable tablet, Chew 1 tablet by mouth 2 (two) times daily., Disp: , Rfl:  .  ursodiol (ACTIGALL) 300 MG capsule, Take 1 capsule (300 mg total) by mouth 3 (three) times daily., Disp: 90 capsule, Rfl: 5 .  Vitamin D, Ergocalciferol, (DRISDOL) 1.25 MG (50000 UT) CAPS capsule, TAKE 1 CAPSULE BY MOUTH EVERY SEVEN DAYS, Disp: 12 capsule, Rfl: 3 .  hydrochlorothiazide (MICROZIDE) 12.5 MG capsule, TAKE ONE CAPSULE BY MOUTH EVERY DAY (Patient not taking: Reported on 10/03/2019), Disp: 90 capsule, Rfl: 0 Social History   Socioeconomic History  . Marital status: Married    Spouse name: [name]  . Number of children: 0  . Years of education: 16  . Highest education level: Bachelor'Elizabeth degree (e.g., BA, AB, BS)  Occupational History  . 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 Smoking  status: Former Smoker    Packs/day: 1.00    Years: 20.00    Pack years: 20.00    Types: Cigarettes    Start date: 01/15/1983    Quit date: 05/03/2019    Years since quitting: 0.4  . Smokeless tobacco: Never Used  Substance and Sexual Activity  . Alcohol use: No    Alcohol/week: 0.0 standard drinks  . Drug use: No  . Sexual activity: Not Currently  Other Topics Concern  . Not on file  Social History Narrative   Married [name]   Limited activity due to back pain   Social Determinants of Health   Financial Resource Strain:   . Difficulty of Paying Living Expenses: Not on file  Food Insecurity:   . Worried About Estate manager/land agent  of Food in the Last Year: Not on file  . Ran Out of Food in the Last Year: Not on file  Transportation Needs:   . Lack of Transportation (Medical): Not on file  . Lack of Transportation (Non-Medical): Not on file  Physical Activity:   . Days of Exercise per Week: Not on file  . Minutes of Exercise per Session: Not on file  Stress:   . Feeling of Stress : Not on file  Social Connections:   . Frequency of Communication with Friends and Family: Not on file  . Frequency of Social Gatherings with Friends and Family: Not on file  . Attends Religious Services: Not on file  . Active Member of Clubs or Organizations: Not on file  . Attends Archivist Meetings: Not on file  . Marital Status: Not on file  Intimate Partner Violence:   . Fear of Current or Ex-Partner: Not on file  . Emotionally Abused: Not on file  . Physically Abused: Not on file  . Sexually Abused: Not on file   Family History  Problem Relation Age of Onset  . Diabetes Mother   . Heart disease Mother   . Asthma Mother   . Kidney disease Mother   . Drug abuse Mother   . Hypertension Mother   . Stroke Mother        fall -several bleed on brain 12-24-88  . Early death Father        trauma  . Colon cancer Other        first cousin, paternal aunt and uncle  . Cancer Paternal  Grandmother        gastric  . Diabetes Paternal Grandmother   . Mental illness Paternal Grandmother   . Heart disease Maternal Grandmother   . Thyroid cancer Other        aunt  . Heart disease Sister   . Mental illness Sister   . Heart attack Sister   . Heart disease Brother   . Lung disease Brother   . Cancer Sister        pancreatic  . Diabetes Sister   . Hypertension Sister   . Tuberculosis Maternal Grandfather   . Heart disease Paternal Grandfather     Objective: Office vital signs reviewed. BP 129/75   Pulse 90   Temp (!) 96.8 F (36 C) (Temporal)   Ht 5\' 2"  (1.575 m)   Wt 171 lb (77.6 kg)   SpO2 96%   BMI 31.28 kg/m   Physical Examination:  General: Awake, alert, well nourished, No acute distress HEENT: Normal, sclera white.   Cardio: regular rate and rhythm, S1S2 heard, no murmurs appreciated Pulm: clear to auscultation bilaterally, no wheezes, rhonchi or rales; normal work of breathing on room air Extremities: warm, well perfused, No edema, cyanosis or clubbing; +2 pulses bilaterally MSK: Normal gait and station Psych: Mood stable, speech normal, affect appropriate, pleasant and interactive Depression screen [facility] 2/9 03/30/2019 02/03/2019 11/10/2018  Decreased Interest 0 1 1  Down, Depressed, Hopeless 0 0 0  PHQ - 2 Score 0 1 1  Altered sleeping 0 - -  Tired, decreased energy 2 - -  Change in appetite 0 - -  Feeling bad or failure about yourself  0 - -  Trouble concentrating 1 - -  Moving slowly or fidgety/restless 1 - -  Suicidal thoughts 0 - -  PHQ-9 Score 4 - -  Difficult doing work/chores - - -  Some recent data might  be hidden   GAD 7 : Generalized Anxiety Score 03/30/2019  Nervous, Anxious, on Edge 2  Control/stop worrying 0  Worry too much - different things 0  Trouble relaxing 0  Restless 0  Easily annoyed or irritable 1  Afraid - awful might happen 1  Total GAD 7 Score 4    Assessment/ Plan: 79 y.o. female   1. Uncontrolled type 2  diabetes mellitus with hyperglycemia (HCC) A1c rapidly rising to 11.5 today.  I had a very frank conversation with her today and informed her that really she should be on insulin at this point.  However, she is very reluctant to start any injected medications.  She was adamant about working on diet modification and pleaded for a couple more weeks for blood sugar monitoring before proceeding with injectable.  We discussed the high risk of cardiovascular events with uncontrolled blood sugars and she voiced good understanding.  I have asked her to monitor her blood sugars every morning and after every meal.  I anticipate we will need to start Lantus or some other basal insulin within the next couple of weeks and have prepared her for this. - Microalbumin / creatinine urine ratio - Bayer DCA Hb A1c Waived - Basic Metabolic Panel  2. Hypertension associated with diabetes ([place]) Controlled - Basic Metabolic Panel  3. Hyperlipidemia associated with type 2 diabetes mellitus ([place]) Intolerant to statin due to liver disease  4. Statin intolerance  5. Elevated liver function tests I have added her gastroenterologist labs and will CC these to him once available - Hepatic Function Panel - Iron, TIBC and Ferritin Panel - CBC  6. Iron deficiency anemia due to chronic blood loss - Hepatic Function Panel - Iron, TIBC and Ferritin Panel - CBC   Orders Placed This Encounter  Procedures  . Microalbumin / creatinine urine ratio  . Bayer DCA Hb A1c Waived  . Basic Metabolic Panel   No orders of the defined types were placed in this encounter.    Janora Norlander, DO [place] [phone]

## 2019-10-04 ENCOUNTER — Telehealth: Payer: Self-pay | Admitting: Family Medicine

## 2019-10-04 LAB — BASIC METABOLIC PANEL
BUN/Creatinine Ratio: 10 — ABNORMAL LOW (ref 12–28)
BUN: 10 mg/dL (ref 8–27)
CO2: 21 mmol/L (ref 20–29)
Calcium: 9.9 mg/dL (ref 8.7–10.3)
Chloride: 96 mmol/L (ref 96–106)
Creatinine, Ser: 0.97 mg/dL (ref 0.57–1.00)
GFR calc Af Amer: 66 mL/min/{1.73_m2} (ref >59)
GFR calc non Af Amer: 57 mL/min/{1.73_m2} — ABNORMAL LOW (ref >59)
Glucose: 436 mg/dL (ref 65–99)
Potassium: 4.2 mmol/L (ref 3.5–5.2)
Sodium: 132 mmol/L — ABNORMAL LOW (ref 134–144)

## 2019-10-04 LAB — CBC
Hematocrit: 39.5 % (ref 34.0–46.6)
Hemoglobin: 12.9 g/dL (ref 11.1–15.9)
MCH: 26.1 pg — ABNORMAL LOW (ref 26.6–33.0)
MCHC: 32.7 g/dL (ref 31.5–35.7)
MCV: 80 fL (ref 79–97)
Platelets: 230 10*3/uL (ref 150–450)
RBC: 4.94 x10E6/uL (ref 3.77–5.28)
RDW: 15.1 % (ref 11.7–15.4)
WBC: 5.4 10*3/uL (ref 3.4–10.8)

## 2019-10-04 LAB — HEPATIC FUNCTION PANEL
ALT: 46 IU/L — ABNORMAL HIGH (ref 0–32)
AST: 56 IU/L — ABNORMAL HIGH (ref 0–40)
Albumin: 3.9 g/dL (ref 3.7–4.7)
Alkaline Phosphatase: 174 IU/L — ABNORMAL HIGH (ref 39–117)
Bilirubin Total: 0.5 mg/dL (ref 0.0–1.2)
Bilirubin, Direct: 0.21 mg/dL (ref 0.00–0.40)
Total Protein: 6.7 g/dL (ref 6.0–8.5)

## 2019-10-04 LAB — IRON,TIBC AND FERRITIN PANEL
Ferritin: 26 ng/mL (ref 15–150)
Iron Saturation: 8 % — CL (ref 15–55)
Iron: 34 ug/dL (ref 27–139)
Total Iron Binding Capacity: 424 ug/dL (ref 250–450)
UIBC: 390 ug/dL — ABNORMAL HIGH (ref 118–369)

## 2019-10-04 LAB — MICROALBUMIN / CREATININE URINE RATIO
Creatinine, Urine: 25.9 mg/dL
Microalb/Creat Ratio: 61 mg/g creat — ABNORMAL HIGH (ref 0–29)
Microalbumin, Urine: 15.7 ug/mL

## 2019-10-04 NOTE — Telephone Encounter (Signed)
LMTCB

## 2019-10-05 ENCOUNTER — Telehealth: Payer: Self-pay | Admitting: Family Medicine

## 2019-10-05 NOTE — Telephone Encounter (Signed)
[facility] called to give patients lab result. Glucose level- 436

## 2019-10-05 NOTE — Telephone Encounter (Signed)
That is much better.

## 2019-10-05 NOTE — Telephone Encounter (Signed)
Has she taken her medications today? If so, she needs to drink plenty of water. Recheck and report if still elevated.

## 2019-10-05 NOTE — Telephone Encounter (Signed)
Patient aware and verbalizes understanding . States that she is taking her medications and check her bs not that long ago and it was 210.

## 2019-10-05 NOTE — Telephone Encounter (Signed)
[email or web address]

## 2019-10-07 ENCOUNTER — Telehealth: Payer: Self-pay | Admitting: Family Medicine

## 2019-10-07 NOTE — Telephone Encounter (Signed)
Returning patient call.  Left message.

## 2019-10-09 ENCOUNTER — Other Ambulatory Visit: Payer: Self-pay | Admitting: Family Medicine

## 2019-10-09 ENCOUNTER — Other Ambulatory Visit (INDEPENDENT_AMBULATORY_CARE_PROVIDER_SITE_OTHER): Payer: Self-pay | Admitting: Internal Medicine

## 2019-10-10 ENCOUNTER — Encounter: Payer: Self-pay | Admitting: Family Medicine

## 2019-10-10 ENCOUNTER — Telehealth: Payer: Self-pay | Admitting: Student

## 2019-10-10 ENCOUNTER — Telehealth (HOSPITAL_COMMUNITY): Payer: Self-pay

## 2019-10-10 NOTE — Telephone Encounter (Signed)
Patient came in and was seen

## 2019-10-10 NOTE — Telephone Encounter (Signed)
NIR.  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 Estanislado Pandy. She has since been followed with routine imaging scans to monitor for changes, most recently MRI/MRA brain/head 08/18/2019. Based on this scan, it was recommended per Dr. Estanislado Pandy that patient have repeat MRI/MRA brain/head in 6 months.  Received call from patient today with questions regarding MRI/MRA result and follow-up. States that she received a call from her PCP's office stating that based on her recent scan she "might have had another stroke", and is questioning why her follow-up is with a 6 month scan. Discussed above with Dr. Estanislado Pandy who states this is an old infarct that has been present since 2016 based on prior imaging scans. Because of this, recommend follow-up as directed (6 month MRI/MRA brain/head). Dr. Estanislado Pandy called patient at [phone] to discuss above. All questions answered and concerns addressed. Patient conveys understanding and agrees with plan.   Bea Graff Margaret Cockerill, PA-C 10/10/2019, 10:05 AM

## 2019-10-10 NOTE — Telephone Encounter (Signed)
error 

## 2019-10-11 ENCOUNTER — Ambulatory Visit: Payer: PPO | Admitting: Cardiology

## 2019-11-03 ENCOUNTER — Ambulatory Visit: Payer: PPO | Admitting: Cardiology

## 2019-11-07 ENCOUNTER — Telehealth: Payer: Self-pay | Admitting: Student

## 2019-11-07 NOTE — Telephone Encounter (Signed)
NIR.  Received fax prescription refill request from pharmacy for Plavix. Faxed filled prescription to [facility] [id number]) at 1335- Plavix 75 mg tablets, take one tablet by mouth once daily, dispense 30 tablets with 3 refills.   Bea Graff Louk, PA-C 11/07/2019, 1:47 PM

## 2019-11-08 ENCOUNTER — Other Ambulatory Visit: Payer: Self-pay | Admitting: Family Medicine

## 2019-11-08 DIAGNOSIS — E1142 Type 2 diabetes mellitus with diabetic polyneuropathy: Secondary | ICD-10-CM

## 2019-11-24 DIAGNOSIS — L820 Inflamed seborrheic keratosis: Secondary | ICD-10-CM | POA: Diagnosis not present

## 2019-11-24 DIAGNOSIS — L853 Xerosis cutis: Secondary | ICD-10-CM | POA: Diagnosis not present

## 2019-12-12 DIAGNOSIS — F419 Anxiety disorder, unspecified: Secondary | ICD-10-CM | POA: Diagnosis not present

## 2019-12-12 DIAGNOSIS — K7469 Other cirrhosis of liver: Secondary | ICD-10-CM | POA: Diagnosis not present

## 2019-12-12 DIAGNOSIS — K219 Gastro-esophageal reflux disease without esophagitis: Secondary | ICD-10-CM | POA: Diagnosis not present

## 2019-12-12 DIAGNOSIS — G4700 Insomnia, unspecified: Secondary | ICD-10-CM | POA: Diagnosis not present

## 2019-12-12 DIAGNOSIS — J4540 Moderate persistent asthma, uncomplicated: Secondary | ICD-10-CM | POA: Diagnosis not present

## 2019-12-12 DIAGNOSIS — E1165 Type 2 diabetes mellitus with hyperglycemia: Secondary | ICD-10-CM | POA: Diagnosis not present

## 2019-12-12 DIAGNOSIS — I25119 Atherosclerotic heart disease of native coronary artery with unspecified angina pectoris: Secondary | ICD-10-CM | POA: Diagnosis not present

## 2019-12-12 DIAGNOSIS — Z0189 Encounter for other specified special examinations: Secondary | ICD-10-CM | POA: Diagnosis not present

## 2019-12-12 DIAGNOSIS — J302 Other seasonal allergic rhinitis: Secondary | ICD-10-CM | POA: Diagnosis not present

## 2019-12-12 DIAGNOSIS — I10 Essential (primary) hypertension: Secondary | ICD-10-CM | POA: Diagnosis not present

## 2019-12-13 ENCOUNTER — Other Ambulatory Visit (HOSPITAL_COMMUNITY): Payer: Self-pay | Admitting: Internal Medicine

## 2019-12-13 DIAGNOSIS — Z1231 Encounter for screening mammogram for malignant neoplasm of breast: Secondary | ICD-10-CM

## 2019-12-15 ENCOUNTER — Telehealth: Payer: Self-pay | Admitting: Radiology

## 2019-12-15 ENCOUNTER — Other Ambulatory Visit: Payer: Self-pay | Admitting: Radiology

## 2019-12-15 NOTE — Progress Notes (Signed)
RX for Plavix 75 mg daily #30 RF x 6 called into [facility]  Ascencion Dike PA-C Interventional Radiology 12/15/2019 3:05 PM

## 2019-12-22 ENCOUNTER — Ambulatory Visit (INDEPENDENT_AMBULATORY_CARE_PROVIDER_SITE_OTHER): Payer: PPO | Admitting: Internal Medicine

## 2019-12-22 ENCOUNTER — Other Ambulatory Visit: Payer: Self-pay

## 2019-12-22 ENCOUNTER — Encounter (INDEPENDENT_AMBULATORY_CARE_PROVIDER_SITE_OTHER): Payer: Self-pay | Admitting: Internal Medicine

## 2019-12-22 VITALS — BP 151/90 | HR 80 | Temp 97.3°F | Ht 62.0 in | Wt 161.2 lb

## 2019-12-22 DIAGNOSIS — Z862 Personal history of diseases of the blood and blood-forming organs and certain disorders involving the immune mechanism: Secondary | ICD-10-CM | POA: Diagnosis not present

## 2019-12-22 DIAGNOSIS — K219 Gastro-esophageal reflux disease without esophagitis: Secondary | ICD-10-CM | POA: Diagnosis not present

## 2019-12-22 DIAGNOSIS — K7400 Hepatic fibrosis, unspecified: Secondary | ICD-10-CM

## 2019-12-22 NOTE — Progress Notes (Signed)
Presenting complaint;  Follow for iron deficiency anemia, hepatic fibrosis and GERD.  Database and subjective:  Patient is 79 year old Caucasian female who has history of iron deficiency anemia secondary to GI bleed was been previously evaluated with EGD colonoscopy and small bowel given capsule study.  Work-up was in December 2019.  Felt she had GI bleed from small bowel secondary to NSAID injury.  Her hemoglobin lately has been normal. She also has history of elevated transaminases for at least 1 year.  Work-up was negative for hepatitis B and C.  She is immune to hepatitis B as she has received vaccine in the past.  Ultrasound in June last year revealed F2/F3 disease.  Mitochondrial antibody was positive and high titer of 114.  She was therefore begun on Urso.  She was last seen in October 2020 and now returns for scheduled visit.  Patient states her primary care physician Dr. Delphina Cahill.  She had blood work by Dr. Lajuana Ripple back on 10/03/2019 and her blood glucose was 436.  Patient states that her blood glucose that morning on her own machine was 110.  Patient says she is not taking Januvia anymore.  She is trying to control her diabetes with diet.  She is taking cinnamon. Patient says she was having flareup with GERD symptoms and decided to increase her pantoprazole to twice daily which she did about 2 months ago.  She feels her heartburn is now well controlled.  She says her appetite is good she eats 3 small meals a day.  She rarely eats snacks.  She denies abdominal pain pruritus or weakness.  Her bowels move daily.  She denies melena or rectal bleeding.  She she would like to take different iron preparation other than Flintstone because of sugar content. Patient says she walks at least 1 mile every day. She has lost 15 pounds since her last visit.  She says weight loss is voluntary and she is not very. She weighed 188 pounds in December 2019.    Current Medications: Outpatient Encounter  Medications as of 12/22/2019  Medication Sig  . albuterol (VENTOLIN HFA) 108 (90 Base) MCG/ACT inhaler INHALE 2 PUFFS EVERY SIX HOURS AS NEEDED FOR WHEEZING OR SHORTNESS OF BREATH  . amLODipine (NORVASC) 10 MG tablet TAKE 1 TABLET BY MOUTH EVERY DAY  . aspirin 325 MG tablet Take 325 mg by mouth daily.  Marland Kitchen augmented betamethasone dipropionate (DIPROLENE-AF) 0.05 % cream APPLY TO THE AFFECTED AREA(S) TWICE DAILY (NOT TO FACE, BETWEEN LEGS, UNDERARMS)  . Carboxymethylcellulose Sodium (REFRESH LIQUIGEL) 1 % GEL Place 1 drop into both eyes 2 (two) times daily.   Marland Kitchen CINNAMON PO Take 1,000 mg by mouth daily as needed (for blood sugars).   . clopidogrel (PLAVIX) 75 MG tablet TAKE ONE TABLET BY MOUTH DAILY (PLEASE USE AUROBINDO BRAND, PT HD FOR ALLERGY TO REDDY). Hold for 5 days  . gabapentin (NEURONTIN) 100 MG capsule TAKE ONE CAPSULE BY MOUTH AT BEDTIME  . hydrochlorothiazide (MICROZIDE) 12.5 MG capsule TAKE ONE CAPSULE BY MOUTH EVERY DAY  . montelukast (SINGULAIR) 5 MG chewable tablet CHEW TWO TABLETS BY MOUTH AT BEDTIME  . pantoprazole (PROTONIX) 40 MG tablet TAKE 1 TABLET BY MOUTH TWICE DAILY  . ursodiol (ACTIGALL) 300 MG capsule TAKE ONE CAPSULE BY MOUTH THREE TIMES DAILY  . pediatric multivitamin-iron (POLY-VI-SOL WITH IRON) 15 MG chewable tablet Chew 1 tablet by mouth 2 (two) times daily.  . [DISCONTINUED] azelastine (ASTELIN) 0.1 % nasal spray Place 1 spray into both nostrils 2 (  two) times daily. Use in each nostril as directed (Patient not taking: Reported on 12/22/2019)  . [DISCONTINUED] fluticasone (FLONASE) 50 MCG/ACT nasal spray Place 2 sprays into both nostrils daily. (Patient not taking: Reported on 12/22/2019)  . [DISCONTINUED] JANUVIA 100 MG tablet TAKE 1 TABLET BY MOUTH DAILY (Patient not taking: Reported on 12/22/2019)  . [DISCONTINUED] ramipril (ALTACE) 10 MG capsule Take 10 mg by mouth daily.    . [DISCONTINUED] Vitamin D, Ergocalciferol, (DRISDOL) 1.25 MG (50000 UT) CAPS capsule TAKE 1  CAPSULE BY MOUTH EVERY SEVEN DAYS (Patient not taking: Reported on 12/22/2019)   No facility-administered encounter medications on file as of 12/22/2019.     Objective: Blood pressure (!) 151/90, pulse 80, temperature (!) 97.3 F (36.3 C), temperature source Temporal, height '5\' 2"'  (1.575 m), weight 161 lb 3.2 oz (73.1 kg). Patient is alert and in no acute distress. She is wearing a mask. Conjunctiva is pink. Sclera is nonicteric Oropharyngeal mucosa is normal. She has upper dental plate and her own teeth and lower jaw. No neck masses or thyromegaly noted. Cardiac exam with regular rhythm normal S1 and S2. No murmur or gallop noted. Lungs are clear to auscultation. Abdomen is full but soft and nontender without masses or organomegaly.  Liver edge is easily palpable lateral to midclavicular line. No LE edema or clubbing noted.  Labs/studies Results:  CBC Latest Ref Rng & Units 10/03/2019 07/01/2019 04/08/2019  WBC 3.4 - 10.8 x10E3/uL 5.4 7.0 5.5  Hemoglobin 11.1 - 15.9 g/dL 12.9 11.6(L) 12.0  Hematocrit 34.0 - 46.6 % 39.5 37.1 38.0  Platelets 150 - 450 x10E3/uL 230 279 291    CMP Latest Ref Rng & Units 10/03/2019 07/01/2019 03/30/2019  Glucose 65 - 99 mg/dL 436(HH) - 331(H)  BUN 8 - 27 mg/dL 10 - 9  Creatinine 0.57 - 1.00 mg/dL 0.97 - 0.83  Sodium 134 - 144 mmol/L 132(L) - 134  Potassium 3.5 - 5.2 mmol/L 4.2 - 4.0  Chloride 96 - 106 mmol/L 96 - 94(L)  CO2 20 - 29 mmol/L 21 - 23  Calcium 8.7 - 10.3 mg/dL 9.9 - 10.2  Total Protein 6.0 - 8.5 g/dL 6.7 6.8 6.6  Total Bilirubin 0.0 - 1.2 mg/dL 0.5 0.7 0.5  Alkaline Phos 39 - 117 IU/L 174(H) - 117  AST 0 - 40 IU/L 56(H) 39(H) 54(H)  ALT 0 - 32 IU/L 46(H) 34(H) 48(H)    Hepatic Function Latest Ref Rng & Units 10/03/2019 07/01/2019 03/30/2019  Total Protein 6.0 - 8.5 g/dL 6.7 6.8 6.6  Albumin 3.7 - 4.7 g/dL 3.9 - 4.4  AST 0 - 40 IU/L 56(H) 39(H) 54(H)  ALT 0 - 32 IU/L 46(H) 34(H) 48(H)  Alk Phosphatase 39 - 117 IU/L 174(H) - 117  Total  Bilirubin 0.0 - 1.2 mg/dL 0.5 0.7 0.5  Bilirubin, Direct 0.00 - 0.40 mg/dL 0.21 0.2 -    Lab data from 10/03/2019 Serum iron 34, TIBC 424 and saturation 8% Serum ferritin 26.  Serum ferritin was 10 on 06/29/2018.   Assessment:  #1.  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  Etiology felt to be fatty liver until mitochondrial antibody titer returned to be very high.  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  Her labs from 10 weeks ago slight increase in her transaminases and alkaline phosphatase was elevated for the first time.  Poorly controlled diabetes may also account for this pump.  Her A1c back in January 2021 was 11.5.  We  will see what the next numbers are.  #2.  History of iron deficiency anemia secondary to chronic GI blood loss.  Which is normal but iron stores are still not replenished.  We will switch her to ferrous sulfate 325 mg p.o. daily.  She may also have impaired iron absorption secondary to chronic acid suppression.  #3.  Weight loss.  Weight loss appears to be intentional.  She has lost 15 pounds since her October 2020 visit.  #4.  Chronic GERD.  She is presently on double dose PPI.  Would consider dropping dose to once daily at her next visit.   Plan:  Patient will have LFTs at the time of her next blood work by Dr. Delphina Cahill on 01/09/2020. Patient will continue Urso at current dose of 300 mg 3 times a day. Discontinue Flintstone chewable. Begin ferrous sulfate 325 mg by mouth daily with breakfast. Office visit in 6 months.

## 2019-12-22 NOTE — Patient Instructions (Signed)
Physician will call with results of blood test when completed. 

## 2019-12-23 MED ORDER — FERROUS SULFATE 325 (65 FE) MG PO TBEC: 325.0000 mg | DELAYED_RELEASE_TABLET | Freq: Every day | ORAL | Status: DC

## 2019-12-26 DIAGNOSIS — K7400 Hepatic fibrosis, unspecified: Secondary | ICD-10-CM | POA: Diagnosis not present

## 2019-12-26 DIAGNOSIS — Z862 Personal history of diseases of the blood and blood-forming organs and certain disorders involving the immune mechanism: Secondary | ICD-10-CM | POA: Diagnosis not present

## 2019-12-27 LAB — HEPATIC FUNCTION PANEL
AG Ratio: 1.5 (calc) (ref 1.0–2.5)
ALT: 52 U/L — ABNORMAL HIGH (ref 6–29)
AST: 61 U/L — ABNORMAL HIGH (ref 10–35)
Albumin: 4.2 g/dL (ref 3.6–5.1)
Alkaline phosphatase (APISO): 126 U/L (ref 37–153)
Bilirubin, Direct: 0.2 mg/dL (ref 0.0–0.2)
Globulin: 2.8 g/dL (calc) (ref 1.9–3.7)
Indirect Bilirubin: 0.4 mg/dL (calc) (ref 0.2–1.2)
Total Bilirubin: 0.6 mg/dL (ref 0.2–1.2)
Total Protein: 7 g/dL (ref 6.1–8.1)

## 2019-12-27 LAB — CBC
HCT: 42 % (ref 35.0–45.0)
Hemoglobin: 13.4 g/dL (ref 11.7–15.5)
MCH: 26 pg — ABNORMAL LOW (ref 27.0–33.0)
MCHC: 31.9 g/dL — ABNORMAL LOW (ref 32.0–36.0)
MCV: 81.4 fL (ref 80.0–100.0)
MPV: 10.6 fL (ref 7.5–12.5)
Platelets: 263 10*3/uL (ref 140–400)
RBC: 5.16 10*6/uL — ABNORMAL HIGH (ref 3.80–5.10)
RDW: 14.7 % (ref 11.0–15.0)
WBC: 6 10*3/uL (ref 3.8–10.8)

## 2019-12-28 ENCOUNTER — Other Ambulatory Visit (INDEPENDENT_AMBULATORY_CARE_PROVIDER_SITE_OTHER): Payer: Self-pay | Admitting: *Deleted

## 2019-12-28 DIAGNOSIS — R7989 Other specified abnormal findings of blood chemistry: Secondary | ICD-10-CM

## 2019-12-28 DIAGNOSIS — R748 Abnormal levels of other serum enzymes: Secondary | ICD-10-CM

## 2019-12-28 DIAGNOSIS — K7400 Hepatic fibrosis, unspecified: Secondary | ICD-10-CM

## 2020-01-02 ENCOUNTER — Ambulatory Visit (INDEPENDENT_AMBULATORY_CARE_PROVIDER_SITE_OTHER): Payer: PPO | Admitting: Internal Medicine

## 2020-01-04 DIAGNOSIS — E1165 Type 2 diabetes mellitus with hyperglycemia: Secondary | ICD-10-CM | POA: Diagnosis not present

## 2020-01-04 DIAGNOSIS — K7469 Other cirrhosis of liver: Secondary | ICD-10-CM | POA: Diagnosis not present

## 2020-01-04 DIAGNOSIS — K219 Gastro-esophageal reflux disease without esophagitis: Secondary | ICD-10-CM | POA: Diagnosis not present

## 2020-01-04 DIAGNOSIS — I10 Essential (primary) hypertension: Secondary | ICD-10-CM | POA: Diagnosis not present

## 2020-01-05 ENCOUNTER — Other Ambulatory Visit: Payer: Self-pay | Admitting: Family Medicine

## 2020-01-09 ENCOUNTER — Other Ambulatory Visit: Payer: Self-pay | Admitting: Family Medicine

## 2020-01-09 DIAGNOSIS — E1165 Type 2 diabetes mellitus with hyperglycemia: Secondary | ICD-10-CM | POA: Diagnosis not present

## 2020-01-09 DIAGNOSIS — K7469 Other cirrhosis of liver: Secondary | ICD-10-CM | POA: Diagnosis not present

## 2020-01-09 DIAGNOSIS — K219 Gastro-esophageal reflux disease without esophagitis: Secondary | ICD-10-CM | POA: Diagnosis not present

## 2020-01-09 DIAGNOSIS — J302 Other seasonal allergic rhinitis: Secondary | ICD-10-CM | POA: Diagnosis not present

## 2020-01-09 DIAGNOSIS — E785 Hyperlipidemia, unspecified: Secondary | ICD-10-CM | POA: Diagnosis not present

## 2020-01-09 DIAGNOSIS — F419 Anxiety disorder, unspecified: Secondary | ICD-10-CM | POA: Diagnosis not present

## 2020-01-09 DIAGNOSIS — I25119 Atherosclerotic heart disease of native coronary artery with unspecified angina pectoris: Secondary | ICD-10-CM | POA: Diagnosis not present

## 2020-01-09 DIAGNOSIS — G4700 Insomnia, unspecified: Secondary | ICD-10-CM | POA: Diagnosis not present

## 2020-01-09 DIAGNOSIS — I10 Essential (primary) hypertension: Secondary | ICD-10-CM | POA: Diagnosis not present

## 2020-01-09 DIAGNOSIS — J4540 Moderate persistent asthma, uncomplicated: Secondary | ICD-10-CM | POA: Diagnosis not present

## 2020-01-09 DIAGNOSIS — D751 Secondary polycythemia: Secondary | ICD-10-CM | POA: Diagnosis not present

## 2020-01-16 ENCOUNTER — Other Ambulatory Visit: Payer: Self-pay

## 2020-01-16 ENCOUNTER — Ambulatory Visit (HOSPITAL_COMMUNITY)
Admission: RE | Admit: 2020-01-16 | Discharge: 2020-01-16 | Disposition: A | Discharge status: Home / Self Care | Payer: PPO | Source: Ambulatory Visit | Attending: Internal Medicine | Admitting: Internal Medicine

## 2020-01-16 DIAGNOSIS — Z1231 Encounter for screening mammogram for malignant neoplasm of breast: Secondary | ICD-10-CM | POA: Diagnosis not present

## 2020-01-19 ENCOUNTER — Encounter: Payer: Self-pay | Admitting: Cardiology

## 2020-01-19 ENCOUNTER — Other Ambulatory Visit: Payer: Self-pay

## 2020-01-19 ENCOUNTER — Ambulatory Visit: Payer: PPO | Admitting: Cardiology

## 2020-01-19 VITALS — BP 136/60 | HR 68 | Ht 62.0 in | Wt 160.8 lb

## 2020-01-19 DIAGNOSIS — I2510 Atherosclerotic heart disease of native coronary artery without angina pectoris: Secondary | ICD-10-CM | POA: Diagnosis not present

## 2020-01-19 DIAGNOSIS — E782 Mixed hyperlipidemia: Secondary | ICD-10-CM | POA: Diagnosis not present

## 2020-01-19 DIAGNOSIS — I10 Essential (primary) hypertension: Secondary | ICD-10-CM | POA: Diagnosis not present

## 2020-01-19 NOTE — Progress Notes (Signed)
Clinical Summary Ms. Pepper is a 79 y.o.female seen today for follow up of the following medical problems   1. 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 Of note she is on plavix for prior stroke by neurologist.   - no recent chest pain. No sob or doe  Compliant with meds  2. HTN - not on ACE or ARB due to allergy - compliant with meds   3. Hyperlipidemia - 03/2019 TC 241 TG 172 HDL 37 LDL 170 - intolerant to statins.  - zetia did not lower cholesterol per her report so she stopped taking.  - has been resistant to pcsk9 inhibitors, referred to lipid clinic multiple times but has not scheduled appt       Past Medical History:  Diagnosis Date  . Absolute anemia 07/28/2018  . Active smoker   . Allergy   . Anxiety    Takes Xanax for anxiety  . Arthritis   . Asthma   . Cataract   . Chronic airway obstruction, not elsewhere classified   . Clotting disorder ([place])    stoke   . Depression   . Diarrhea   . Diverticulosis of colon (without mention of hemorrhage)   . Esophageal reflux   . Family history of malignant neoplasm of gastrointestinal tract   . GI bleed   . Headache(784.0)    Irregular  . Neuromuscular disorder ([facility])    numbness is left hand and left elbow  . Obesity, unspecified   . Other and unspecified hyperlipidemia   . Personal history of colonic polyps   . Stricture and stenosis of esophagus   . Stroke ([place])   . Thyroid disease   . Type II or unspecified type diabetes mellitus without mention of complication, not stated as uncontrolled   . Unspecified asthma(493.90)   . Unspecified essential hypertension      Allergies  Allergen Reactions  . Banana Anaphylaxis  . Codeine Nausea And Vomiting and Other (See Comments)    PROJECTILE VOMITING  . Stevia [Stevioside] Swelling and Other (See Comments)    Face, tongue, and eye swelling   . Actos [Pioglitazone] Swelling  . Benicar [Olmesartan Medoxomil]  Other (See Comments)    Feel bad  . Chantix [Varenicline] Other (See Comments)    "Worked opposite."  . Diovan [Valsartan] Other (See Comments)    Feel bad  . Lipitor [Atorvastatin] Other (See Comments)    REACTION:  Joint pain  . Spinach Other (See Comments)    Stomach problems  . Vicodin [Hydrocodone-Acetaminophen] Nausea And Vomiting  . Zocor [Simvastatin] Other (See Comments)    REACTION:  "made liver function incorrectly"  . Ace Inhibitors Cough  . Celebrex [Celecoxib] Rash  . Latex Rash and Other (See Comments)    REACTION: red rash  . Metformin And Related     GI - upset   . Penicillins Rash and Other (See Comments)    Has patient had a PCN reaction causing immediate rash, facial/tongue/throat swelling, SOB or lightheadedness with hypotension: No Has patient had a PCN reaction causing severe rash involving mucus membranes or skin necrosis: No Has patient had a PCN reaction that required hospitalization: No- MD office Has patient had a PCN reaction occurring within the last 10 years: No If all of the above answers are "NO", then may proceed with Cephalosporin use.   . Sulfonamide Derivatives Rash     Current Outpatient Medications  Medication Sig Dispense Refill  .  albuterol (VENTOLIN HFA) 108 (90 Base) MCG/ACT inhaler INHALE 2 PUFFS EVERY SIX HOURS AS NEEDED FOR WHEEZING OR SHORTNESS OF BREATH 8.5 g 2  . amLODipine (NORVASC) 10 MG tablet TAKE 1 TABLET BY MOUTH EVERY DAY 90 tablet 1  . aspirin 325 MG tablet Take 325 mg by mouth daily.    Marland Kitchen augmented betamethasone dipropionate (DIPROLENE-AF) 0.05 % cream APPLY TO THE AFFECTED AREA(S) TWICE DAILY (NOT TO FACE, BETWEEN LEGS, UNDERARMS)    . Carboxymethylcellulose Sodium (REFRESH LIQUIGEL) 1 % GEL Place 1 drop into both eyes 2 (two) times daily.     Marland Kitchen CINNAMON PO Take 1,000 mg by mouth daily as needed (for blood sugars).     . clopidogrel (PLAVIX) 75 MG tablet TAKE ONE TABLET BY MOUTH DAILY (PLEASE USE AUROBINDO BRAND, PT HD  FOR ALLERGY TO REDDY). Hold for 5 days 90 tablet 1  . ferrous sulfate 325 (65 FE) MG EC tablet Take 1 tablet (325 mg total) by mouth daily with breakfast.    . gabapentin (NEURONTIN) 100 MG capsule TAKE ONE CAPSULE BY MOUTH AT BEDTIME 90 capsule 0  . hydrochlorothiazide (MICROZIDE) 12.5 MG capsule TAKE ONE CAPSULE BY MOUTH EVERY DAY 90 capsule 0  . montelukast (SINGULAIR) 5 MG chewable tablet CHEW TWO TABLETS BY MOUTH AT BEDTIME 60 tablet 2  . pantoprazole (PROTONIX) 40 MG tablet TAKE 1 TABLET BY MOUTH TWICE DAILY 90 tablet 2  . ursodiol (ACTIGALL) 300 MG capsule TAKE ONE CAPSULE BY MOUTH THREE TIMES DAILY 90 capsule 5   No current facility-administered medications for this visit.     Past Surgical History:  Procedure Laterality Date  . APPENDECTOMY    . BACK SURGERY     Spinal surgery  . BRAIN SURGERY     2013 - stoke ( fall in 2010)   . CARDIAC CATHETERIZATION  2006   LAD: 30%, RCA : 20%, normal EF, elevated LVEDP  . CARDIAC CATHETERIZATION    . CEREBRAL ANGIOGRAM  12/20/2015  . COLONOSCOPY N/A 08/09/2018   Procedure: COLONOSCOPY;  Surgeon: Rogene Houston, MD;  [facility];  Service: Endoscopy;  Laterality: N/A;  1:55  . ESOPHAGEAL DILATION N/A 08/09/2018   Procedure: ESOPHAGEAL DILATION;  Surgeon: Rogene Houston, MD;  [facility];  Service: Endoscopy;  Laterality: N/A;  . ESOPHAGOGASTRODUODENOSCOPY N/A 08/09/2018   Procedure: ESOPHAGOGASTRODUODENOSCOPY (EGD);  Surgeon: Rogene Houston, MD;  [facility];  Service: Endoscopy;  Laterality: N/A;  . GIVENS CAPSULE STUDY N/A 08/25/2018   Procedure: GIVENS CAPSULE STUDY;  Surgeon: Rogene Houston, MD;  [facility];  Service: Endoscopy;  Laterality: N/A;  . IR ANGIO INTRA EXTRACRAN SEL COM CAROTID INNOMINATE BILAT MOD SED  01/14/2018  . IR ANGIO INTRA EXTRACRAN SEL COM CAROTID INNOMINATE BILAT MOD SED  05/11/2018  . IR ANGIO VERTEBRAL SEL SUBCLAVIAN INNOMINATE UNI L MOD SED  01/14/2018  . IR  ANGIO VERTEBRAL SEL SUBCLAVIAN INNOMINATE UNI L MOD SED  05/11/2018  . IR ANGIO VERTEBRAL SEL VERTEBRAL UNI R MOD SED  01/14/2018  . IR ANGIO VERTEBRAL SEL VERTEBRAL UNI R MOD SED  05/11/2018  . KNEE ARTHROSCOPY     left  . LUMBAR DISC SURGERY    . POLYPECTOMY  08/09/2018   Procedure: POLYPECTOMY;  Surgeon: Rogene Houston, MD;  [facility];  Service: Endoscopy;;  ascending colon (CS x 2, HS x5) hepatic flexure (HSx1), recto-sigmoid (HSx1)  . TONSILLECTOMY    . TOTAL ABDOMINAL  HYSTERECTOMY     menopause/bleeding     Allergies  Allergen Reactions  . Banana Anaphylaxis  . Codeine Nausea And Vomiting and Other (See Comments)    PROJECTILE VOMITING  . Stevia [Stevioside] Swelling and Other (See Comments)    Face, tongue, and eye swelling   . Actos [Pioglitazone] Swelling  . Benicar [Olmesartan Medoxomil] Other (See Comments)    Feel bad  . Chantix [Varenicline] Other (See Comments)    "Worked opposite."  . Diovan [Valsartan] Other (See Comments)    Feel bad  . Lipitor [Atorvastatin] Other (See Comments)    REACTION:  Joint pain  . Spinach Other (See Comments)    Stomach problems  . Vicodin [Hydrocodone-Acetaminophen] Nausea And Vomiting  . Zocor [Simvastatin] Other (See Comments)    REACTION:  "made liver function incorrectly"  . Ace Inhibitors Cough  . Celebrex [Celecoxib] Rash  . Latex Rash and Other (See Comments)    REACTION: red rash  . Metformin And Related     GI - upset   . Penicillins Rash and Other (See Comments)    Has patient had a PCN reaction causing immediate rash, facial/tongue/throat swelling, SOB or lightheadedness with hypotension: No Has patient had a PCN reaction causing severe rash involving mucus membranes or skin necrosis: No Has patient had a PCN reaction that required hospitalization: No- MD office Has patient had a PCN reaction occurring within the last 10 years: No If all of the above answers are "NO", then may proceed with Cephalosporin  use.   . Sulfonamide Derivatives Rash      Family History  Problem Relation Age of Onset  . Diabetes Mother   . Heart disease Mother   . Asthma Mother   . Kidney disease Mother   . Drug abuse Mother   . Hypertension Mother   . Stroke Mother        fall -several bleed on brain 12/23/88  . Early death Father        trauma  . Colon cancer Other        first cousin, paternal aunt and uncle  . Cancer Paternal Grandmother        gastric  . Diabetes Paternal Grandmother   . Mental illness Paternal Grandmother   . Heart disease Maternal Grandmother   . Thyroid cancer Other        aunt  . Heart disease Sister   . Mental illness Sister   . Heart attack Sister   . Heart disease Brother   . Lung disease Brother   . Cancer Sister        pancreatic  . Diabetes Sister   . Hypertension Sister   . Tuberculosis Maternal Grandfather   . Heart disease Paternal Grandfather      Social History Ms. Dea reports that she quit smoking about 8 months ago. Her smoking use included cigarettes. She started smoking about 37 years ago. She has a 20.00 pack-year smoking history. She has never used smokeless tobacco. Ms. Wenig reports no history of alcohol use.   Review of Systems CONSTITUTIONAL: No weight loss, fever, chills, weakness or fatigue.  HEENT: Eyes: No visual loss, blurred vision, double vision or yellow sclerae.No hearing loss, sneezing, congestion, runny nose or sore throat.  SKIN: No rash or itching.  CARDIOVASCULAR: per hpi RESPIRATORY: No shortness of breath, cough or sputum.  GASTROINTESTINAL: No anorexia, nausea, vomiting or diarrhea. No abdominal pain or blood.  GENITOURINARY: No burning on urination, no polyuria NEUROLOGICAL: No  headache, dizziness, syncope, paralysis, ataxia, numbness or tingling in the extremities. No change in bowel or bladder control.  MUSCULOSKELETAL: No muscle, back pain, joint pain or stiffness.  LYMPHATICS: No enlarged nodes. No history of  splenectomy.  PSYCHIATRIC: No history of depression or anxiety.  ENDOCRINOLOGIC: No reports of sweating, cold or heat intolerance. No polyuria or polydipsia.  Marland Kitchen   Physical Examination Vitals:   01/19/20 1049  BP: 136/60  Pulse: 68  SpO2: 96%   Filed Weights   01/19/20 1049  Weight: 160 lb 12.8 oz (72.9 kg)    Gen: resting comfortably, no acute distress HEENT: no scleral icterus, pupils equal round and reactive, no palptable cervical adenopathy,  CV: RRR, no m/r/g, no jvd Resp: Clear to auscultation bilaterally GI: abdomen is soft, non-tender, non-distended, normal bowel sounds, no hepatosplenomegaly MSK: extremities are warm, no edema.  Skin: warm, no rash Neuro:  no focal deficits Psych: appropriate affect   Diagnostic Studies  02/2012 Echo: LVEF 60-65%, mild LVH, no WMAs, grade I diastolic dysfunction,   0000000 Cath DATE OF PROCEDURE: 12/26/2004  DATE OF DISCHARGE: 12/26/2004  CARDIAC CATHETERIZATION  INDICATIONS: Ms. Molloy is a pleasant 79 year old woman with a history of  hypertension, dyslipidemia, type 2 diabetes mellitus, tobacco use, and  asthma. She has had recent problems with mild dyspnea and lower extremity  edema. Given her cardiac risk factor profile, she is referred for diagnostic  coronary angiography and left ventriculography to assess coronary anatomy  and left ventricular function.  PROCEDURE:  1. Left heart catheterization.  2. Selective coronary angiography.  3. Left ventriculography.  DESCRIPTION OF PROCEDURE: The area about the right femoral artery was  anesthetized 1% lidocaine and a 4-French sheath was placed in the right  femoral artery via modified Seldinger technique. Standard preformed 4-French  JL-4 and JR-4 catheters used for selective coronary angiography and angled  pigtail catheters used for left heart catheterization and left  ventriculography. All exchanges were made over wire. The patient tolerated  procedure  well without immediate complications.  HEMODYNAMIC RESULTS:  1. Left ventricle 151/25 mmHg.  2. Aorta 152/76 mmHg.  ANGIOGRAPHIC FINDINGS:  1. Left main coronary artery is free of significant flow-limiting coronary  atherosclerosis.  2. The left anterior descending is a medium caliber vessel with three small  diagonal branches. There are minor Luminal irregularities noted  throughout this system with approximately 30% diffuse mid-vessel  stenosis. There is also a 30-40% ostial stenosis involving the first  diagonal Carmen Tolliver. No flow-limiting stenoses are noted.  3. The circumflex coronary artery is a medium caliber vessel. There is a  very small Leylany Nored in the AV groove and two larger bifurcating obtuse  marginal branches. Minor Luminal irregularities are noted without flow-  limiting stenoses.  4. The right coronary artery is a medium caliber vessel with posterior  descending Glenna Brunkow. Minor Luminal irregularities are noted. There is a  20% stenosis in the proximal to mid-vessel.  LEFT VENTRICULOGRAPHY: Left ventriculography was performed in the RAO  projection revealing an ejection fraction approximately 65% in the setting  of ventricular ectopy without significant mitral regurgitation or focal wall  motion abnormality.  DIAGNOSES:  1. Mild coronary atherosclerosis as outlined with no flow-limiting stenoses  in the major epicardial vessels.  2. Left ventricular ejection fraction of approximately 65% in the setting  of ventricular ectopy. No significant mitral regurgitation is noted. The  left ventricular end-diastolic pressure is increased at 25 mmHg. At this  point would suspect an element of diastolic  dysfunction particularly  given elevated left renal end-diastolic pressure.  The patient is already on an ACE inhibitor and diuretic. We will plan to  continue maximizing medical therapy. We will arrange a follow-up 2-D  echocardiogram for diastolic  parameters and also to exclude  significantly elevated pulmonary pressures. She will have follow-up in  the office to review this and her progress.  07/26/13 Clinic EKG: sinus rhythm, normal axis, RBBB   Assessment and Plan  1. Non-obstructive CAD - no recent symptoms, continue current meds - of note on DAPT due to prior stroke from her neurologist, not for a cardiac reason  2. HTN - at goal, continue current meds  3. Hyperlipidemia - intolerant to statins - resistant to pcsk9 inhibitors, previously referred to lipid clinic but has been resistant - asked to contact us if she decides to consider pcsk9 inhibitors       Arnoldo Lenis, M.D.

## 2020-01-19 NOTE — Patient Instructions (Signed)
Medication Instructions:  Your physician recommends that you continue on your current medications as directed. Please refer to the Current Medication list given to you today.  *If you need a refill on your cardiac medications before your next appointment, please call your pharmacy*   Lab Work: NONE   If you have labs (blood work) drawn today and your tests are completely normal, you will receive your results only by: . MyChart Message (if you have MyChart) OR . A paper copy in the mail If you have any lab test that is abnormal or we need to change your treatment, we will call you to review the results.   Testing/Procedures: NONE    Follow-Up: At [facility], you and your health needs are our priority.  As part of our continuing mission to provide you with exceptional heart care, we have created designated Provider Care Teams.  These Care Teams include your primary Cardiologist (physician) and Advanced Practice Providers (APPs -  Physician Assistants and Nurse Practitioners) who all work together to provide you with the care you need, when you need it.  We recommend signing up for the patient portal called "MyChart".  Sign up information is provided on this After Visit Summary.  MyChart is used to connect with patients for Virtual Visits (Telemedicine).  Patients are able to view lab/test results, encounter notes, upcoming appointments, etc.  Non-urgent messages can be sent to your provider as well.   To learn more about what you can do with MyChart, go to [email or web address].    Your next appointment:   1 year(s)  The format for your next appointment:   In Person  Provider:   Jonathan Branch, MD   Other Instructions Thank you for choosing [facility]!    

## 2020-02-08 DIAGNOSIS — E1169 Type 2 diabetes mellitus with other specified complication: Secondary | ICD-10-CM | POA: Diagnosis not present

## 2020-02-08 DIAGNOSIS — E559 Vitamin D deficiency, unspecified: Secondary | ICD-10-CM | POA: Diagnosis not present

## 2020-02-08 DIAGNOSIS — Z0000 Encounter for general adult medical examination without abnormal findings: Secondary | ICD-10-CM | POA: Diagnosis not present

## 2020-02-08 DIAGNOSIS — E785 Hyperlipidemia, unspecified: Secondary | ICD-10-CM | POA: Diagnosis not present

## 2020-02-13 DIAGNOSIS — E1165 Type 2 diabetes mellitus with hyperglycemia: Secondary | ICD-10-CM | POA: Diagnosis not present

## 2020-02-13 DIAGNOSIS — E559 Vitamin D deficiency, unspecified: Secondary | ICD-10-CM | POA: Diagnosis not present

## 2020-02-13 DIAGNOSIS — D751 Secondary polycythemia: Secondary | ICD-10-CM | POA: Diagnosis not present

## 2020-02-14 ENCOUNTER — Other Ambulatory Visit (HOSPITAL_COMMUNITY): Payer: Self-pay | Admitting: Interventional Radiology

## 2020-02-14 DIAGNOSIS — R42 Dizziness and giddiness: Secondary | ICD-10-CM

## 2020-02-14 DIAGNOSIS — I729 Aneurysm of unspecified site: Secondary | ICD-10-CM

## 2020-02-14 DIAGNOSIS — I771 Stricture of artery: Secondary | ICD-10-CM

## 2020-02-15 ENCOUNTER — Telehealth (HOSPITAL_COMMUNITY): Payer: Self-pay | Admitting: Radiology

## 2020-02-15 NOTE — Telephone Encounter (Signed)
Pt called and states that she is scheduled for cerebral angiogram on 02/24/20. Her husband would like to speak to Dr. Estanislado Pandy prior to her having to have this done. Will send to PA Radonna Ricker). JM

## 2020-02-17 ENCOUNTER — Telehealth: Payer: Self-pay | Admitting: Student

## 2020-02-17 NOTE — Telephone Encounter (Signed)
NIR.  Patient is scheduled for an image-guided diagnostic cerebral arteriogram. Patient's husband requests phone call prior to procedure.  Dr. Estanislado Pandy called patient's husband's [name]) home phone at 1029 and spoke with [name]. He asked if this test is necessary. Explained we need to accurately evaluate aneurysm. He states he is nervous because his wife acts "loopy" following procedure x months. States wife is irritable, forgetful, confused, ect. Explained that if patient is asymptomatic and given his concerns of mental status changes, Dr. Estanislado Pandy recommends cancelling diagnostic cerebral arteriogram and following-up with MRA head (without contrast) 6 months from prior. Will send message to schedulers- our schedulers to call patient to set up this imaging scan. All questions answered and concerns addressed. Patient and husband convey understanding and agree with plan.  Please call NIR with questions/concerns.   Bea Graff Corene Resnick, PA-C 02/17/2020, 10:35 AM

## 2020-02-21 ENCOUNTER — Telehealth (HOSPITAL_COMMUNITY): Payer: Self-pay

## 2020-02-21 NOTE — Telephone Encounter (Signed)
Called to schedule mra, no answer, no vm. AW 

## 2020-02-24 ENCOUNTER — Encounter (HOSPITAL_COMMUNITY): Payer: Self-pay

## 2020-02-24 ENCOUNTER — Ambulatory Visit (HOSPITAL_COMMUNITY): Payer: PPO

## 2020-02-29 ENCOUNTER — Ambulatory Visit: Payer: PPO

## 2020-02-29 ENCOUNTER — Other Ambulatory Visit (INDEPENDENT_AMBULATORY_CARE_PROVIDER_SITE_OTHER): Payer: Self-pay | Admitting: *Deleted

## 2020-02-29 ENCOUNTER — Telehealth: Payer: Self-pay | Admitting: Internal Medicine

## 2020-02-29 DIAGNOSIS — K7400 Hepatic fibrosis, unspecified: Secondary | ICD-10-CM

## 2020-02-29 DIAGNOSIS — R7989 Other specified abnormal findings of blood chemistry: Secondary | ICD-10-CM

## 2020-02-29 DIAGNOSIS — R748 Abnormal levels of other serum enzymes: Secondary | ICD-10-CM

## 2020-02-29 NOTE — Progress Notes (Signed)
Attempted to do AWV and patient states that she is no longer a patient here and doesn't want to do visit

## 2020-02-29 NOTE — Telephone Encounter (Signed)
Called patient no answer.

## 2020-03-05 ENCOUNTER — Other Ambulatory Visit (INDEPENDENT_AMBULATORY_CARE_PROVIDER_SITE_OTHER): Payer: Self-pay | Admitting: Gastroenterology

## 2020-03-21 ENCOUNTER — Other Ambulatory Visit: Payer: Self-pay

## 2020-03-21 ENCOUNTER — Ambulatory Visit (HOSPITAL_COMMUNITY)
Admission: RE | Admit: 2020-03-21 | Discharge: 2020-03-21 | Disposition: A | Discharge status: Home / Self Care | Payer: PPO | Source: Ambulatory Visit | Attending: Interventional Radiology | Admitting: Interventional Radiology

## 2020-03-21 DIAGNOSIS — R42 Dizziness and giddiness: Secondary | ICD-10-CM

## 2020-03-21 DIAGNOSIS — I729 Aneurysm of unspecified site: Secondary | ICD-10-CM

## 2020-03-21 DIAGNOSIS — I771 Stricture of artery: Secondary | ICD-10-CM | POA: Diagnosis not present

## 2020-03-21 DIAGNOSIS — I671 Cerebral aneurysm, nonruptured: Secondary | ICD-10-CM | POA: Insufficient documentation

## 2020-03-21 DIAGNOSIS — I6601 Occlusion and stenosis of right middle cerebral artery: Secondary | ICD-10-CM | POA: Diagnosis not present

## 2020-03-21 DIAGNOSIS — Q283 Other malformations of cerebral vessels: Secondary | ICD-10-CM | POA: Diagnosis not present

## 2020-03-21 DIAGNOSIS — I672 Cerebral atherosclerosis: Secondary | ICD-10-CM | POA: Diagnosis not present

## 2020-03-26 ENCOUNTER — Telehealth (HOSPITAL_COMMUNITY): Payer: Self-pay

## 2020-03-26 NOTE — Telephone Encounter (Signed)
Called pt regarding recent imaging, no answer, left vm. AW  

## 2020-03-26 NOTE — Telephone Encounter (Signed)
Pt agreed to f/u in 6 months with mra head. AW 

## 2020-03-28 DIAGNOSIS — R748 Abnormal levels of other serum enzymes: Secondary | ICD-10-CM | POA: Diagnosis not present

## 2020-03-28 DIAGNOSIS — K7400 Hepatic fibrosis, unspecified: Secondary | ICD-10-CM | POA: Diagnosis not present

## 2020-03-28 DIAGNOSIS — R7989 Other specified abnormal findings of blood chemistry: Secondary | ICD-10-CM | POA: Diagnosis not present

## 2020-03-28 LAB — HEPATIC FUNCTION PANEL
AG Ratio: 1.7 (calc) (ref 1.0–2.5)
ALT: 24 U/L (ref 6–29)
AST: 31 U/L (ref 10–35)
Albumin: 4 g/dL (ref 3.6–5.1)
Alkaline phosphatase (APISO): 107 U/L (ref 37–153)
Bilirubin, Direct: 0.1 mg/dL (ref 0.0–0.2)
Globulin: 2.4 g/dL (calc) (ref 1.9–3.7)
Indirect Bilirubin: 0.4 mg/dL (calc) (ref 0.2–1.2)
Total Bilirubin: 0.5 mg/dL (ref 0.2–1.2)
Total Protein: 6.4 g/dL (ref 6.1–8.1)

## 2020-04-04 ENCOUNTER — Other Ambulatory Visit (INDEPENDENT_AMBULATORY_CARE_PROVIDER_SITE_OTHER): Payer: Self-pay | Admitting: Gastroenterology

## 2020-04-09 DIAGNOSIS — E1165 Type 2 diabetes mellitus with hyperglycemia: Secondary | ICD-10-CM | POA: Diagnosis not present

## 2020-04-09 DIAGNOSIS — H5712 Ocular pain, left eye: Secondary | ICD-10-CM | POA: Diagnosis not present

## 2020-04-09 DIAGNOSIS — J019 Acute sinusitis, unspecified: Secondary | ICD-10-CM | POA: Diagnosis not present

## 2020-04-18 DIAGNOSIS — D509 Iron deficiency anemia, unspecified: Secondary | ICD-10-CM | POA: Diagnosis not present

## 2020-04-18 DIAGNOSIS — K219 Gastro-esophageal reflux disease without esophagitis: Secondary | ICD-10-CM | POA: Diagnosis not present

## 2020-04-19 DIAGNOSIS — H40013 Open angle with borderline findings, low risk, bilateral: Secondary | ICD-10-CM | POA: Diagnosis not present

## 2020-04-19 DIAGNOSIS — Z961 Presence of intraocular lens: Secondary | ICD-10-CM | POA: Diagnosis not present

## 2020-04-19 DIAGNOSIS — H2512 Age-related nuclear cataract, left eye: Secondary | ICD-10-CM | POA: Diagnosis not present

## 2020-04-19 DIAGNOSIS — E119 Type 2 diabetes mellitus without complications: Secondary | ICD-10-CM | POA: Diagnosis not present

## 2020-04-26 ENCOUNTER — Other Ambulatory Visit (INDEPENDENT_AMBULATORY_CARE_PROVIDER_SITE_OTHER): Payer: Self-pay | Admitting: Gastroenterology

## 2020-05-02 ENCOUNTER — Other Ambulatory Visit (INDEPENDENT_AMBULATORY_CARE_PROVIDER_SITE_OTHER): Payer: Self-pay | Admitting: Gastroenterology

## 2020-05-03 DIAGNOSIS — J309 Allergic rhinitis, unspecified: Secondary | ICD-10-CM | POA: Diagnosis not present

## 2020-05-28 DIAGNOSIS — E1165 Type 2 diabetes mellitus with hyperglycemia: Secondary | ICD-10-CM | POA: Diagnosis not present

## 2020-05-31 DIAGNOSIS — E1165 Type 2 diabetes mellitus with hyperglycemia: Secondary | ICD-10-CM | POA: Diagnosis not present

## 2020-05-31 DIAGNOSIS — E559 Vitamin D deficiency, unspecified: Secondary | ICD-10-CM | POA: Diagnosis not present

## 2020-05-31 DIAGNOSIS — D751 Secondary polycythemia: Secondary | ICD-10-CM | POA: Diagnosis not present

## 2020-05-31 DIAGNOSIS — E785 Hyperlipidemia, unspecified: Secondary | ICD-10-CM | POA: Diagnosis not present

## 2020-05-31 DIAGNOSIS — Z0001 Encounter for general adult medical examination with abnormal findings: Secondary | ICD-10-CM | POA: Diagnosis not present

## 2020-06-20 ENCOUNTER — Telehealth (HOSPITAL_COMMUNITY): Payer: Self-pay | Admitting: Radiology

## 2020-06-20 NOTE — Telephone Encounter (Signed)
Pt called requesting refill of Plavix. Refill called in to [facility]. JM

## 2020-06-26 ENCOUNTER — Ambulatory Visit (INDEPENDENT_AMBULATORY_CARE_PROVIDER_SITE_OTHER): Payer: PPO | Admitting: Internal Medicine

## 2020-06-26 ENCOUNTER — Encounter (INDEPENDENT_AMBULATORY_CARE_PROVIDER_SITE_OTHER): Payer: Self-pay | Admitting: *Deleted

## 2020-06-26 ENCOUNTER — Other Ambulatory Visit: Payer: Self-pay

## 2020-06-26 ENCOUNTER — Encounter (INDEPENDENT_AMBULATORY_CARE_PROVIDER_SITE_OTHER): Payer: Self-pay | Admitting: Internal Medicine

## 2020-06-26 VITALS — BP 157/71 | HR 85 | Temp 98.4°F | Ht 62.0 in | Wt 153.7 lb

## 2020-06-26 DIAGNOSIS — K7400 Hepatic fibrosis, unspecified: Secondary | ICD-10-CM

## 2020-06-26 DIAGNOSIS — K743 Primary biliary cirrhosis: Secondary | ICD-10-CM | POA: Insufficient documentation

## 2020-06-26 NOTE — Progress Notes (Signed)
Presenting complaint;  Follow-up for primary biliary cholangitis   Database and subjective:  Patient is 79 year old Caucasian female who is here for scheduled visit.  She previously has been evaluated for iron deficiency anemia and had EGD colonoscopy and given capsule study for 2 years ago.  Was felt she was losing blood from small bowel secondary and secondary injury.  Lately her H&H has been normal. Last year she was evaluated for elevated transaminases.  Work-up revealed elevated antimitochondrial antibody.  Elastography revealed F2/F3 disease.  Patient was begun on Urso about 16 months ago.  This therapy resulted in normalization of her transaminases.  She is not having any side effects with Urso.  She denies abdominal pain or itching.  She also has a history of heartburn.  PPI is working well.  She has sporadic nausea but no vomiting.  She denies dysphagia.  She says her appetite is fair.  She has lost  Current Medications: Outpatient Encounter Medications as of 06/26/2020  Medication Sig  . albuterol (VENTOLIN HFA) 108 (90 Base) MCG/ACT inhaler INHALE 2 PUFFS EVERY SIX HOURS AS NEEDED FOR WHEEZING OR SHORTNESS OF BREATH  . amLODipine (NORVASC) 10 MG tablet TAKE 1 TABLET BY MOUTH EVERY DAY  . aspirin 325 MG tablet Take 325 mg by mouth daily.  . Carboxymethylcellulose Sodium (REFRESH LIQUIGEL) 1 % GEL Place 1 drop into both eyes 2 (two) times daily.   Marland Kitchen CINNAMON PO Take 1,000 mg by mouth daily as needed (for blood sugars).   . clopidogrel (PLAVIX) 75 MG tablet TAKE ONE TABLET BY MOUTH DAILY (PLEASE USE AUROBINDO BRAND, PT HD FOR ALLERGY TO REDDY). Hold for 5 days  . gabapentin (NEURONTIN) 100 MG capsule TAKE ONE CAPSULE BY MOUTH AT BEDTIME  . hydrochlorothiazide (MICROZIDE) 12.5 MG capsule TAKE ONE CAPSULE BY MOUTH EVERY DAY  . pantoprazole (PROTONIX) 40 MG tablet TAKE 1 TABLET BY MOUTH DAILY BEFORE BREAKFAST  . ursodiol (ACTIGALL) 300 MG capsule TAKE ONE CAPSULE BY MOUTH THREE TIMES  DAILY  . ferrous sulfate 325 (65 FE) MG EC tablet Take 1 tablet (325 mg total) by mouth daily with breakfast. (Patient not taking: Reported on 06/26/2020)  . montelukast (SINGULAIR) 5 MG chewable tablet CHEW TWO TABLETS BY MOUTH AT BEDTIME (Patient not taking: Reported on 06/26/2020)  . [DISCONTINUED] augmented betamethasone dipropionate (DIPROLENE-AF) 0.05 % cream APPLY TO THE AFFECTED AREA(S) TWICE DAILY (NOT TO FACE, BETWEEN LEGS, UNDERARMS) (Patient not taking: Reported on 06/26/2020)  . [DISCONTINUED] ramipril (ALTACE) 10 MG capsule Take 10 mg by mouth daily.     No facility-administered encounter medications on file as of 06/26/2020.     Objective: Blood pressure (!) 157/71, pulse 85, temperature 98.4 F (36.9 C), temperature source Oral, height '5\' 2"'  (1.575 m), weight 153 lb 11.2 oz (69.7 kg). Patient is alert and in no acute distress. She is wearing a mask. Conjunctiva is pink. Sclera is nonicteric Oropharyngeal mucosa is normal. No neck masses or thyromegaly noted. Cardiac exam with regular rhythm normal S1 and S2. No murmur or gallop noted. Lungs are clear to auscultation. Abdomen is full.  On palpation soft and nontender.  Liver edge is firm.  Both lobes are easily palpable.  Spleen is nonpalpable.  No masses noted. No LE edema or clubbing noted.  Labs/studies Results:  CBC Latest Ref Rng & Units 12/26/2019 10/03/2019 07/01/2019  WBC 3.8 - 10.8 Thousand/uL 6.0 5.4 7.0  Hemoglobin 11.7 - 15.5 g/dL 13.4 12.9 11.6(L)  Hematocrit 35 - 45 % 42.0 39.5 37.1  Platelets 140 - 400 Thousand/uL 263 230 279    CMP Latest Ref Rng & Units 03/28/2020 12/26/2019 10/03/2019  Glucose 65 - 99 mg/dL - - 436(HH)  BUN 8 - 27 mg/dL - - 10  Creatinine 0.57 - 1.00 mg/dL - - 0.97  Sodium 134 - 144 mmol/L - - 132(L)  Potassium 3.5 - 5.2 mmol/L - - 4.2  Chloride 96 - 106 mmol/L - - 96  CO2 20 - 29 mmol/L - - 21  Calcium 8.7 - 10.3 mg/dL - - 9.9  Total Protein 6.1 - 8.1 g/dL 6.4 7.0 6.7  Total  Bilirubin 0.2 - 1.2 mg/dL 0.5 0.6 0.5  Alkaline Phos 39 - 117 IU/L - - 174(H)  AST 10 - 35 U/L 31 61(H) 56(H)  ALT 6 - 29 U/L 24 52(H) 46(H)    Hepatic Function Latest Ref Rng & Units 03/28/2020 12/26/2019 10/03/2019  Total Protein 6.1 - 8.1 g/dL 6.4 7.0 6.7  Albumin 3.7 - 4.7 g/dL - - 3.9  AST 10 - 35 U/L 31 61(H) 56(H)  ALT 6 - 29 U/L 24 52(H) 46(H)  Alk Phosphatase 39 - 117 IU/L - - 174(H)  Total Bilirubin 0.2 - 1.2 mg/dL 0.5 0.6 0.5  Bilirubin, Direct 0.0 - 0.2 mg/dL 0.1 0.2 0.21     Assessment:  #1.  Primary biliary cholangitis.  Diagnosis made based on high titer of antimitochondrial antibody.  Her transaminases have normalized with Urso which she has been for about 16 months.  #2.  Hepatic fibrosis.  She had elastography in June last year revealing F2/F3 disease.  Her risk factors include primary biliary cholangitis and steatosis.  Plan:  We will schedule patient for abdominal ultrasound. She will go to the lab for LFTs. Office visit in 6 months.

## 2020-06-26 NOTE — Patient Instructions (Signed)
Physician will call with results of blood test and ultrasound when completed. 

## 2020-07-02 ENCOUNTER — Ambulatory Visit (HOSPITAL_COMMUNITY)
Admission: RE | Admit: 2020-07-02 | Discharge: 2020-07-02 | Disposition: A | Discharge status: Home / Self Care | Payer: PPO | Source: Ambulatory Visit | Attending: Internal Medicine | Admitting: Internal Medicine

## 2020-07-02 ENCOUNTER — Other Ambulatory Visit (HOSPITAL_COMMUNITY)
Admission: RE | Admit: 2020-07-02 | Discharge: 2020-07-02 | Disposition: A | Discharge status: Home / Self Care | Payer: PPO | Source: Ambulatory Visit | Attending: Internal Medicine | Admitting: Internal Medicine

## 2020-07-02 ENCOUNTER — Other Ambulatory Visit: Payer: Self-pay

## 2020-07-02 DIAGNOSIS — K743 Primary biliary cirrhosis: Secondary | ICD-10-CM | POA: Insufficient documentation

## 2020-07-02 DIAGNOSIS — K7400 Hepatic fibrosis, unspecified: Secondary | ICD-10-CM | POA: Insufficient documentation

## 2020-07-02 DIAGNOSIS — K838 Other specified diseases of biliary tract: Secondary | ICD-10-CM | POA: Diagnosis not present

## 2020-07-02 DIAGNOSIS — N281 Cyst of kidney, acquired: Secondary | ICD-10-CM | POA: Diagnosis not present

## 2020-07-02 LAB — HEPATIC FUNCTION PANEL
ALT: 27 U/L (ref 0–44)
AST: 30 U/L (ref 15–41)
Albumin: 3.7 g/dL (ref 3.5–5.0)
Alkaline Phosphatase: 103 U/L (ref 38–126)
Bilirubin, Direct: <0.1 mg/dL (ref 0.0–0.2)
Total Bilirubin: 0.6 mg/dL (ref 0.3–1.2)
Total Protein: 7.2 g/dL (ref 6.5–8.1)

## 2020-07-17 ENCOUNTER — Other Ambulatory Visit (INDEPENDENT_AMBULATORY_CARE_PROVIDER_SITE_OTHER): Payer: Self-pay | Admitting: Gastroenterology

## 2020-08-14 ENCOUNTER — Telehealth (HOSPITAL_COMMUNITY): Payer: Self-pay | Admitting: Radiology

## 2020-08-14 NOTE — Telephone Encounter (Signed)
Rx called in to [facility] for Plavix 75mg  1 daily #30 with 6 refills per Deveshwar JM

## 2020-09-11 ENCOUNTER — Ambulatory Visit (INDEPENDENT_AMBULATORY_CARE_PROVIDER_SITE_OTHER): Payer: HMO | Admitting: Nurse Practitioner

## 2020-09-11 ENCOUNTER — Encounter: Payer: Self-pay | Admitting: Nurse Practitioner

## 2020-09-11 ENCOUNTER — Other Ambulatory Visit: Payer: Self-pay

## 2020-09-11 DIAGNOSIS — E1142 Type 2 diabetes mellitus with diabetic polyneuropathy: Secondary | ICD-10-CM

## 2020-09-11 DIAGNOSIS — K7400 Hepatic fibrosis, unspecified: Secondary | ICD-10-CM

## 2020-09-11 DIAGNOSIS — E079 Disorder of thyroid, unspecified: Secondary | ICD-10-CM

## 2020-09-11 DIAGNOSIS — D509 Iron deficiency anemia, unspecified: Secondary | ICD-10-CM

## 2020-09-11 DIAGNOSIS — I152 Hypertension secondary to endocrine disorders: Secondary | ICD-10-CM

## 2020-09-11 DIAGNOSIS — E1159 Type 2 diabetes mellitus with other circulatory complications: Secondary | ICD-10-CM

## 2020-09-11 DIAGNOSIS — Z7689 Persons encountering health services in other specified circumstances: Secondary | ICD-10-CM | POA: Insufficient documentation

## 2020-09-11 MED ORDER — AMLODIPINE BESYLATE 10 MG PO TABS: 10.0000 mg | ORAL_TABLET | Freq: Every day | ORAL | 1 refills | Status: DC

## 2020-09-11 NOTE — Progress Notes (Signed)
New Patient Office Visit  Subjective:  Patient ID: Elizabeth [name], female    DOB: 15-Sep-1940  Age: 80 y.o. MRN: [id number]  CC: No chief complaint on file.   HPI [name] presents for new patient visit.  Transferring care from [name], MD. Last labs were over 3 months ago and are due now.  Her last physical was within the year, but she is unsure of the date. 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   Past Medical History:  Diagnosis Date  . Absolute anemia 07/28/2018  . Active smoker   . Allergy   . Anxiety    Takes Xanax for anxiety  . Arthritis   . Asthma   . Cataract   . Chronic airway obstruction, not elsewhere classified   . Clotting disorder (HCC)    stoke   . Depression   . Diarrhea   . Diverticulosis of colon (without mention of hemorrhage)   . Esophageal reflux   . Family history of malignant neoplasm of gastrointestinal tract   . GI bleed   . Headache(784.0)    Irregular  . Neuromuscular disorder (HCC)    numbness is left hand and left elbow  . Obesity, unspecified   . Other and unspecified hyperlipidemia   . Personal history of colonic polyps   . Stricture and stenosis of esophagus   . Stroke (HCC)   . Thyroid disease   . Type II or unspecified type diabetes mellitus without mention of complication, not stated as uncontrolled   . Unspecified asthma(493.90)   . Unspecified essential hypertension     Past Surgical History:  Procedure Laterality Date  . APPENDECTOMY    . BACK SURGERY     Spinal surgery  . BRAIN SURGERY     2013 - stoke ( fall in 2010)   . CARDIAC CATHETERIZATION  2006   LAD: 30%, RCA : 20%, normal EF, elevated LVEDP  . CARDIAC CATHETERIZATION    . CEREBRAL ANGIOGRAM  12/20/2015  . COLONOSCOPY N/A 08/09/2018   Procedure: COLONOSCOPY;  Surgeon: Malissa Hippo, MD;  [facility];  Service: Endoscopy;  Laterality: N/A;  1:55  . ESOPHAGEAL DILATION N/A 08/09/2018    Procedure: ESOPHAGEAL DILATION;  Surgeon: Malissa Hippo, MD;  [facility];  Service: Endoscopy;  Laterality: N/A;  . ESOPHAGOGASTRODUODENOSCOPY N/A 08/09/2018   Procedure: ESOPHAGOGASTRODUODENOSCOPY (EGD);  Surgeon: Malissa Hippo, MD;  [facility];  Service: Endoscopy;  Laterality: N/A;  . GIVENS CAPSULE STUDY N/A 08/25/2018   Procedure: GIVENS CAPSULE STUDY;  Surgeon: Malissa Hippo, MD;  [facility];  Service: Endoscopy;  Laterality: N/A;  . IR ANGIO INTRA EXTRACRAN SEL COM CAROTID INNOMINATE BILAT MOD SED  01/14/2018  . IR ANGIO INTRA EXTRACRAN SEL COM CAROTID INNOMINATE BILAT MOD SED  05/11/2018  . IR ANGIO VERTEBRAL SEL SUBCLAVIAN INNOMINATE UNI L MOD SED  01/14/2018  . IR ANGIO VERTEBRAL SEL SUBCLAVIAN INNOMINATE UNI L MOD SED  05/11/2018  . IR ANGIO VERTEBRAL SEL VERTEBRAL UNI R MOD SED  01/14/2018  . IR ANGIO VERTEBRAL SEL VERTEBRAL UNI R MOD SED  05/11/2018  . KNEE ARTHROSCOPY     left  . LUMBAR DISC SURGERY    . POLYPECTOMY  08/09/2018   Procedure: POLYPECTOMY;  Surgeon: Malissa Hippo, MD;  [facility];  Service: Endoscopy;;  ascending colon (CS x 2, HS x5) hepatic  flexure (HSx1), recto-sigmoid (HSx1)  . TONSILLECTOMY    . TOTAL ABDOMINAL HYSTERECTOMY     menopause/bleeding    Family History  Problem Relation Age of Onset  . Diabetes Mother   . Heart disease Mother   . Asthma Mother   . Kidney disease Mother   . Drug abuse Mother   . Hypertension Mother   . Stroke Mother        fall -several bleed on brain 01/13/1989  . Early death Father        trauma  . Colon cancer Other        first cousin, paternal aunt and uncle  . Cancer Paternal Grandmother        gastric  . Diabetes Paternal Grandmother   . Mental illness Paternal Grandmother   . Heart disease Maternal Grandmother   . Thyroid cancer Other        aunt  . Heart disease Sister   . Mental illness Sister   . Heart attack Sister   . Heart disease Brother   . Lung  disease Brother   . Cancer Sister        pancreatic  . Diabetes Sister   . Hypertension Sister   . Tuberculosis Maternal Grandfather   . Heart disease Paternal Grandfather     Social History   Socioeconomic History  . Marital status: Married    Spouse name: [name]  . Number of children: 0  . Years of education: 16  . Highest education level: Bachelor's degree (e.g., BA, AB, BS)  Occupational History  . 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 Smoking status: Former Smoker    Packs/day: 1.00    Years: 20.00    Pack years: 20.00    Types: Cigarettes    Start date: 01/15/1983    Quit date: 05/03/2019    Years since quitting: 1.3  . Smokeless tobacco: Never Used  Vaping Use  . Vaping Use: Never used  Substance and Sexual Activity  . Alcohol use: No    Alcohol/week: 0.0 standard drinks  . Drug use: No  . Sexual activity: Not Currently  Other Topics Concern  . Not on file  Social History Narrative   Married [name]   Limited activity due to back pain   Social Determinants of Corporate investment banker Strain: Not on file  Food Insecurity: Not on file  Transportation Needs: Not on file  Physical Activity: Not on file  Stress: Not on file  Social Connections: Not on file  Intimate Partner Violence: Not on file    ROS Review of Systems  Constitutional: Negative.   Respiratory: Negative.   Cardiovascular: Negative.   Genitourinary:       Urinating at night when taking HCTZ, so she cut her dose in half  Psychiatric/Behavioral: Negative.     Objective:   Today's Vitals: There were no vitals taken for this visit.  Physical Exam Constitutional:      Appearance: Normal appearance.  Cardiovascular:     Rate and Rhythm: Normal rate and regular rhythm.     Pulses: Normal pulses.     Heart sounds: Normal heart sounds.  Pulmonary:     Effort: Pulmonary effort is normal.     Breath sounds: Normal breath sounds.  Skin:     General: Skin is warm and dry.  Neurological:     Mental Status: She is alert.  Psychiatric:  Mood and Affect: Mood normal.        Behavior: Behavior normal.        Thought Content: Thought content normal.        Judgment: Judgment normal.     Assessment & Plan:   Problem List Items Addressed This Visit   None     Outpatient Encounter Medications as of 09/11/2020  Medication Sig  . albuterol (VENTOLIN HFA) 108 (90 Base) MCG/ACT inhaler INHALE 2 PUFFS EVERY SIX HOURS AS NEEDED FOR WHEEZING OR SHORTNESS OF BREATH  . amLODipine (NORVASC) 10 MG tablet TAKE 1 TABLET BY MOUTH EVERY DAY  . aspirin 325 MG tablet Take 325 mg by mouth daily.  . Carboxymethylcellulose Sodium (REFRESH LIQUIGEL) 1 % GEL Place 1 drop into both eyes 2 (two) times daily.   Marland Kitchen CINNAMON PO Take 1,000 mg by mouth daily as needed (for blood sugars).   . clopidogrel (PLAVIX) 75 MG tablet TAKE ONE TABLET BY MOUTH DAILY (PLEASE USE AUROBINDO BRAND, PT HD FOR ALLERGY TO REDDY). Hold for 5 days  . gabapentin (NEURONTIN) 100 MG capsule TAKE ONE CAPSULE BY MOUTH AT BEDTIME  . hydrochlorothiazide (MICROZIDE) 12.5 MG capsule TAKE ONE CAPSULE BY MOUTH EVERY DAY  . pantoprazole (PROTONIX) 40 MG tablet TAKE 1 TABLET BY MOUTH DAILY BEFORE BREAKFAST  . ursodiol (ACTIGALL) 300 MG capsule TAKE ONE CAPSULE BY MOUTH THREE TIMES DAILY  . [DISCONTINUED] ramipril (ALTACE) 10 MG capsule Take 10 mg by mouth daily.     No facility-administered encounter medications on file as of 09/11/2020.    Follow-up: No follow-ups on file.   Noreene Larsson, NP

## 2020-09-11 NOTE — Assessment & Plan Note (Signed)
-  BP elevated today -she states she cut her HCTZ in half to avoid nocturia and that has helped

## 2020-09-11 NOTE — Assessment & Plan Note (Addendum)
-  no A1c to review today -will draw with next set of labs -she will bring a list of her meds, and records requested; ? Jardiance -no statin d/t cirrhosis issues -allergic to ACEi

## 2020-09-11 NOTE — Assessment & Plan Note (Signed)
-  will check iron panel with next set of labs

## 2020-09-11 NOTE — Assessment & Plan Note (Signed)
-  requested medical records -will consider screenings at next appt.; Epic was down during face-to-face

## 2020-09-11 NOTE — Assessment & Plan Note (Signed)
-  followed by Dr. Karilyn Cota -started after statin therapy

## 2020-09-11 NOTE — Assessment & Plan Note (Signed)
-  will check TSH and T4 with next set of labs

## 2020-09-25 ENCOUNTER — Ambulatory Visit: Payer: HMO | Admitting: Nurse Practitioner

## 2020-10-03 ENCOUNTER — Other Ambulatory Visit: Payer: Self-pay

## 2020-10-03 ENCOUNTER — Encounter: Payer: Self-pay | Admitting: Nurse Practitioner

## 2020-10-03 ENCOUNTER — Ambulatory Visit (INDEPENDENT_AMBULATORY_CARE_PROVIDER_SITE_OTHER): Payer: HMO | Admitting: Nurse Practitioner

## 2020-10-03 DIAGNOSIS — S81801D Unspecified open wound, right lower leg, subsequent encounter: Secondary | ICD-10-CM | POA: Insufficient documentation

## 2020-10-03 DIAGNOSIS — S81801A Unspecified open wound, right lower leg, initial encounter: Secondary | ICD-10-CM | POA: Diagnosis not present

## 2020-10-03 NOTE — Assessment & Plan Note (Signed)
-  hematoma to right lower leg -has black/purple scab to top of hematoma that is half the size of a dime -we discussed using neosporin or triple antibiotic ointment just on that dark spot to help with wound healing

## 2020-10-03 NOTE — Progress Notes (Signed)
Acute Office Visit  Subjective:    Patient ID: Elizabeth [name], female    DOB: 14-Apr-1941, 80 y.o.   MRN: [id number]  Chief Complaint  Patient presents with  . Leg Pain    R leg injury 09/26/20 - dropped a 2 liter bottle on leg.     HPI Patient is in today for right leg pain. She dropped a 2L bottle on her right leg in [employer] on 09/26/20, and she still has swelling.  She has a scab that she is concerned about on top of the swelling. She has tried using hydrocortisone cream, but it is not helping.  Past Medical History:  Diagnosis Date  . Absolute anemia 07/28/2018  . Active smoker   . Allergy   . Anxiety    Takes Xanax for anxiety  . Arthritis   . Asthma   . Cataract   . Chronic airway obstruction, not elsewhere classified   . Clotting disorder ([place])    stoke   . Depression   . Diarrhea   . Diverticulosis of colon (without mention of hemorrhage)   . Esophageal reflux   . Family history of malignant neoplasm of gastrointestinal tract   . GI bleed   . Headache(784.0)    Irregular  . Neuromuscular disorder (HCC)    numbness is left hand and left elbow  . Obesity, unspecified   . Other and unspecified hyperlipidemia   . Personal history of colonic polyps   . Stricture and stenosis of esophagus   . Stroke ([place])   . Thyroid disease   . Type II or unspecified type diabetes mellitus without mention of complication, not stated as uncontrolled   . Unspecified asthma(493.90)   . Unspecified essential hypertension     Past Surgical History:  Procedure Laterality Date  . APPENDECTOMY    . BACK SURGERY     Spinal surgery  . BRAIN SURGERY     2013 - [place]oke ( fall in 2010)   . CARDIAC CATHETERIZATION  2006   LAD: 30%, RCA : 20%, normal EF, elevated LVEDP  . CARDIAC CATHETERIZATION    . CEREBRAL ANGIOGRAM  12/20/2015  . COLONOSCOPY N/A 08/09/2018   Procedure: COLONOSCOPY;  Surgeon: Rogene Houston, MD;  [facility];  Service: Endoscopy;  Laterality: N/A;  1:55  .  ESOPHAGEAL DILATION N/A 08/09/2018   Procedure: ESOPHAGEAL DILATION;  Surgeon: Rogene Houston, MD;  [facility];  Service: Endoscopy;  Laterality: N/A;  . ESOPHAGOGASTRODUODENOSCOPY N/A 08/09/2018   Procedure: ESOPHAGOGASTRODUODENOSCOPY (EGD);  Surgeon: Rogene Houston, MD;  [facility];  Service: Endoscopy;  Laterality: N/A;  . GIVENS CAPSULE STUDY N/A 08/25/2018   Procedure: GIVENS CAPSULE STUDY;  Surgeon: Rogene Houston, MD;  [facility];  Service: Endoscopy;  Laterality: N/A;  . IR ANGIO INTRA EXTRACRAN SEL COM CAROTID INNOMINATE BILAT MOD SED  01/14/2018  . IR ANGIO INTRA EXTRACRAN SEL COM CAROTID INNOMINATE BILAT MOD SED  05/11/2018  . IR ANGIO VERTEBRAL SEL SUBCLAVIAN INNOMINATE UNI L MOD SED  01/14/2018  . IR ANGIO VERTEBRAL SEL SUBCLAVIAN INNOMINATE UNI L MOD SED  05/11/2018  . IR ANGIO VERTEBRAL SEL VERTEBRAL UNI R MOD SED  01/14/2018  . IR ANGIO VERTEBRAL SEL VERTEBRAL UNI R MOD SED  05/11/2018  . KNEE ARTHROSCOPY     left  . LUMBAR DISC SURGERY    . POLYPECTOMY  08/09/2018   Procedure: POLYPECTOMY;  Surgeon: Rogene Houston, MD;  [facility];  Service: Endoscopy;;  ascending colon (CS x 2, HS x5) hepatic flexure (HSx1), recto-sigmoid (HSx1)  . TONSILLECTOMY    . TOTAL ABDOMINAL HYSTERECTOMY     menopause/bleeding    Family History  Problem Relation Age of Onset  . Diabetes Mother   . Heart disease Mother   . Asthma Mother   . Kidney disease Mother   . Drug abuse Mother   . Hypertension Mother   . Stroke Mother        fall -several bleed on brain Jan 06, 1989  . Early death Father        trauma  . Colon cancer Other        first cousin, paternal aunt and uncle  . Cancer Paternal Grandmother        gastric  . Diabetes Paternal Grandmother   . Mental illness Paternal Grandmother   . Heart disease Maternal Grandmother   . Thyroid cancer Other        aunt  . Heart disease Sister   . Mental illness Sister   . Heart attack Sister   .  Heart disease Brother   . Lung disease Brother   . Cancer Sister        pancreatic  . Diabetes Sister   . Hypertension Sister   . Tuberculosis Maternal Grandfather   . Heart disease Paternal Grandfather     Social History   Socioeconomic History  . Marital status: Married    Spouse name: [name]  . Number of children: 0  . Years of education: 16  . Highest education level: Bachelor's degree (e.g., BA, AB, BS)  Occupational History  . 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 Smoking status: Former Smoker    Packs/day: 1.00    Years: 20.00    Pack years: 20.00    Types: Cigarettes    Start date: 01/15/1983    Quit date: 05/03/2019    Years since quitting: 1.4  . Smokeless tobacco: Never Used  Vaping Use  . Vaping Use: Never used  Substance and Sexual Activity  . Alcohol use: No    Alcohol/week: 0.0 standard drinks  . Drug use: No  . Sexual activity: Not Currently  Other Topics Concern  . Not on file  Social History Narrative   Married [name]   Limited activity due to back pain   Social Determinants of Health   Financial Resource Strain: Not on file  Food Insecurity: Not on file  Transportation Needs: Not on file  Physical Activity: Not on file  Stress: Not on file  Social Connections: Not on file  Intimate Partner Violence: Not on file    Outpatient Medications Prior to Visit  Medication Sig Dispense Refill  . albuterol (VENTOLIN HFA) 108 (90 Base) MCG/ACT inhaler INHALE 2 PUFFS EVERY SIX HOURS AS NEEDED FOR WHEEZING OR SHORTNESS OF BREATH 8.5 g 2  . amLODipine (NORVASC) 10 MG tablet Take 1 tablet (10 mg total) by mouth daily. 90 tablet 1  . aspirin 325 MG tablet Take 325 mg by mouth daily.    . Carboxymethylcellulose Sodium 1 % GEL Place 1 drop into both eyes 2 (two) times daily.    Marland Kitchen CINNAMON PO Take 1,000 mg by mouth daily as needed (for blood sugars).     . clopidogrel (PLAVIX) 75 MG tablet TAKE ONE TABLET BY MOUTH DAILY  (PLEASE USE AUROBINDO BRAND, PT HD FOR ALLERGY TO REDDY).  Hold for 5 days 90 tablet 1  . gabapentin (NEURONTIN) 100 MG capsule TAKE ONE CAPSULE BY MOUTH AT BEDTIME 90 capsule 0  . hydrochlorothiazide (MICROZIDE) 12.5 MG capsule TAKE ONE CAPSULE BY MOUTH EVERY DAY 90 capsule 0  . pantoprazole (PROTONIX) 40 MG tablet TAKE 1 TABLET BY MOUTH DAILY BEFORE BREAKFAST 30 tablet 5  . ursodiol (ACTIGALL) 300 MG capsule TAKE ONE CAPSULE BY MOUTH THREE TIMES DAILY 90 capsule 5   No facility-administered medications prior to visit.    Allergies  Allergen Reactions  . Banana Anaphylaxis  . Codeine Nausea And Vomiting and Other (See Comments)    PROJECTILE VOMITING  . Stevia [Stevioside] Swelling and Other (See Comments)    Face, tongue, and eye swelling   . Actos [Pioglitazone] Swelling  . Benicar [Olmesartan Medoxomil] Other (See Comments)    Feel bad  . Chantix [Varenicline] Other (See Comments)    "Worked opposite."  . Diovan [Valsartan] Other (See Comments)    Feel bad  . Lipitor [Atorvastatin] Other (See Comments)    REACTION:  Joint pain  . Spinach Other (See Comments)    Stomach problems  . Vicodin [Hydrocodone-Acetaminophen] Nausea And Vomiting  . Zocor [Simvastatin] Other (See Comments)    REACTION:  "made liver function incorrectly"  . Ace Inhibitors Cough  . Celebrex [Celecoxib] Rash  . Latex Rash and Other (See Comments)    REACTION: red rash  . Metformin And Related     GI - upset   . Penicillins Rash and Other (See Comments)    Has patient had a PCN reaction causing immediate rash, facial/tongue/throat swelling, SOB or lightheadedness with hypotension: No Has patient had a PCN reaction causing severe rash involving mucus membranes or skin necrosis: No Has patient had a PCN reaction that required hospitalization: No- MD office Has patient had a PCN reaction occurring within the last 10 years: No If all of the above answers are "NO", then may proceed with Cephalosporin use.    . Sulfonamide Derivatives Rash    Review of Systems  Constitutional: Negative.   Skin: Positive for wound.       Right lower leg       Objective:    Physical Exam Constitutional:      Appearance: Normal appearance.  Skin:    Comments: Right lower leg wound with hematoma, hematoma size of egg, small scab half the size of dime present  Neurological:     Mental Status: She is alert.     BP (!) 153/81   Pulse 86   Temp 98.5 F (36.9 C)   Resp 20   Ht 5\' 2"  (1.575 m)   Wt 153 lb (69.4 kg)   SpO2 97%   BMI 27.98 kg/m  Wt Readings from Last 3 Encounters:  10/03/20 153 lb (69.4 kg)  09/11/20 152 lb (68.9 kg)  06/26/20 153 lb 11.2 oz (69.7 kg)    Health Maintenance Due  Topic Date Due  . COVID-19 Vaccine (1) Never done  . PNA vac Low Risk Adult (2 of 2 - PPSV23) 11/26/2014  . OPHTHALMOLOGY EXAM  05/05/2015  . FOOT EXAM  11/10/2019  . HEMOGLOBIN A1C  04/01/2020  . INFLUENZA VACCINE  04/08/2020  . URINE MICROALBUMIN  10/02/2020    There are no preventive care reminders to display for this patient.   Lab Results  Component Value Date   TSH 1.850 03/30/2019   Lab Results  Component Value Date   WBC 6.0 12/26/2019  HGB 13.4 12/26/2019   HCT 42.0 12/26/2019   MCV 81.4 12/26/2019   PLT 263 12/26/2019   Lab Results  Component Value Date   NA 132 (L) 10/03/2019   K 4.2 10/03/2019   CO2 21 10/03/2019   GLUCOSE 436 (HH) 10/03/2019   BUN 10 10/03/2019   CREATININE 0.97 10/03/2019   BILITOT 0.6 07/02/2020   ALKPHOS 103 07/02/2020   AST 30 07/02/2020   ALT 27 07/02/2020   PROT 7.2 07/02/2020   ALBUMIN 3.7 07/02/2020   CALCIUM 9.9 10/03/2019   ANIONGAP 9 08/18/2018   Lab Results  Component Value Date   CHOL 241 (H) 03/30/2019   Lab Results  Component Value Date   HDL 37 (L) 03/30/2019   Lab Results  Component Value Date   LDLCALC 170 (H) 03/30/2019   Lab Results  Component Value Date   TRIG 172 (H) 03/30/2019   Lab Results  Component Value  Date   CHOLHDL 6.5 (H) 03/30/2019   Lab Results  Component Value Date   HGBA1C 11.5 (H) 10/03/2019       Assessment & Plan:   Problem List Items Addressed This Visit      Other   Leg wound, right, initial encounter    -hematoma to right lower leg -has black/purple scab to top of hematoma that is half the size of a dime -we discussed using neosporin or triple antibiotic ointment just on that dark spot to help with wound healing           No orders of the defined types were placed in this encounter.    Noreene Larsson, NP

## 2020-10-03 NOTE — Patient Instructions (Signed)
We will have a telephone lab visit on Friday or Monday.

## 2020-10-04 ENCOUNTER — Other Ambulatory Visit: Payer: Self-pay | Admitting: Nurse Practitioner

## 2020-10-04 ENCOUNTER — Other Ambulatory Visit: Payer: Self-pay

## 2020-10-04 DIAGNOSIS — E119 Type 2 diabetes mellitus without complications: Secondary | ICD-10-CM

## 2020-10-04 DIAGNOSIS — D509 Iron deficiency anemia, unspecified: Secondary | ICD-10-CM

## 2020-10-04 DIAGNOSIS — D500 Iron deficiency anemia secondary to blood loss (chronic): Secondary | ICD-10-CM

## 2020-10-04 LAB — HEMOGLOBIN A1C
Est. average glucose Bld gHb Est-mCnc: 131 mg/dL
Hgb A1c MFr Bld: 6.2 % — ABNORMAL HIGH (ref 4.8–5.6)

## 2020-10-04 LAB — TSH+FREE T4
Free T4: 1.24 ng/dL (ref 0.82–1.77)
TSH: 1.79 u[IU]/mL (ref 0.450–4.500)

## 2020-10-04 LAB — CBC WITH DIFFERENTIAL/PLATELET
Basophils Absolute: 0.1 10*3/uL (ref 0.0–0.2)
Basos: 1 %
EOS (ABSOLUTE): 0.2 10*3/uL (ref 0.0–0.4)
Eos: 3 %
Hematocrit: 33.8 % — ABNORMAL LOW (ref 34.0–46.6)
Hemoglobin: 10.3 g/dL — ABNORMAL LOW (ref 11.1–15.9)
Immature Grans (Abs): 0 10*3/uL (ref 0.0–0.1)
Immature Granulocytes: 0 %
Lymphocytes Absolute: 1.9 10*3/uL (ref 0.7–3.1)
Lymphs: 31 %
MCH: 21.9 pg — ABNORMAL LOW (ref 26.6–33.0)
MCHC: 30.5 g/dL — ABNORMAL LOW (ref 31.5–35.7)
MCV: 72 fL — ABNORMAL LOW (ref 79–97)
Monocytes Absolute: 0.4 10*3/uL (ref 0.1–0.9)
Monocytes: 7 %
Neutrophils Absolute: 3.5 10*3/uL (ref 1.4–7.0)
Neutrophils: 58 %
Platelets: 331 10*3/uL (ref 150–450)
RBC: 4.7 x10E6/uL (ref 3.77–5.28)
RDW: 15.3 % (ref 11.7–15.4)
WBC: 6 10*3/uL (ref 3.4–10.8)

## 2020-10-04 LAB — CMP14+EGFR
ALT: 22 IU/L (ref 0–32)
AST: 28 IU/L (ref 0–40)
Albumin/Globulin Ratio: 1.6 (ref 1.2–2.2)
Albumin: 4.4 g/dL (ref 3.7–4.7)
Alkaline Phosphatase: 138 IU/L — ABNORMAL HIGH (ref 44–121)
BUN/Creatinine Ratio: 15 (ref 12–28)
BUN: 12 mg/dL (ref 8–27)
Bilirubin Total: 0.5 mg/dL (ref 0.0–1.2)
CO2: 22 mmol/L (ref 20–29)
Calcium: 10.1 mg/dL (ref 8.7–10.3)
Chloride: 99 mmol/L (ref 96–106)
Creatinine, Ser: 0.79 mg/dL (ref 0.57–1.00)
GFR calc Af Amer: 84 mL/min/{1.73_m2} (ref >59)
GFR calc non Af Amer: 72 mL/min/{1.73_m2} (ref >59)
Globulin, Total: 2.7 g/dL (ref 1.5–4.5)
Glucose: 119 mg/dL — ABNORMAL HIGH (ref 65–99)
Potassium: 4.2 mmol/L (ref 3.5–5.2)
Sodium: 136 mmol/L (ref 134–144)
Total Protein: 7.1 g/dL (ref 6.0–8.5)

## 2020-10-04 LAB — LIPID PANEL WITH LDL/HDL RATIO
Cholesterol, Total: 218 mg/dL — ABNORMAL HIGH (ref 100–199)
HDL: 45 mg/dL (ref >39)
LDL Chol Calc (NIH): 154 mg/dL — ABNORMAL HIGH (ref 0–99)
LDL/HDL Ratio: 3.4 ratio — ABNORMAL HIGH (ref 0.0–3.2)
Triglycerides: 105 mg/dL (ref 0–149)
VLDL Cholesterol Cal: 19 mg/dL (ref 5–40)

## 2020-10-04 LAB — IRON,TIBC AND FERRITIN PANEL
Ferritin: 10 ng/mL — ABNORMAL LOW (ref 15–150)
Iron Saturation: 5 % — CL (ref 15–55)
Iron: 27 ug/dL (ref 27–139)
Total Iron Binding Capacity: 494 ug/dL — ABNORMAL HIGH (ref 250–450)
UIBC: 467 ug/dL — ABNORMAL HIGH (ref 118–369)

## 2020-10-04 MED ORDER — FERROUS SULFATE 325 (65 FE) MG PO TABS: 325.0000 mg | ORAL_TABLET | Freq: Every day | ORAL | 3 refills | Status: DC

## 2020-10-04 MED ORDER — EZETIMIBE 10 MG PO TABS: 10.0000 mg | ORAL_TABLET | Freq: Every day | ORAL | 3 refills | Status: DC

## 2020-10-04 NOTE — Progress Notes (Signed)
Her iron levels are low. I called in an oral iron supplement. Let's recheck labs in 1 month.  Her LDL, or bad cholesterol, is still elevated, so I called in zetia, which is not a statin.

## 2020-10-08 ENCOUNTER — Encounter: Payer: Self-pay | Admitting: Nurse Practitioner

## 2020-10-08 ENCOUNTER — Other Ambulatory Visit: Payer: Self-pay

## 2020-10-08 ENCOUNTER — Telehealth (INDEPENDENT_AMBULATORY_CARE_PROVIDER_SITE_OTHER): Payer: HMO | Admitting: Nurse Practitioner

## 2020-10-08 DIAGNOSIS — E785 Hyperlipidemia, unspecified: Secondary | ICD-10-CM | POA: Diagnosis not present

## 2020-10-08 DIAGNOSIS — D509 Iron deficiency anemia, unspecified: Secondary | ICD-10-CM

## 2020-10-08 DIAGNOSIS — E1169 Type 2 diabetes mellitus with other specified complication: Secondary | ICD-10-CM

## 2020-10-08 DIAGNOSIS — S81801D Unspecified open wound, right lower leg, subsequent encounter: Secondary | ICD-10-CM

## 2020-10-08 NOTE — Assessment & Plan Note (Signed)
-  iron levels are low -started an iron supplement  -she has had some constipation, but is doing ok now

## 2020-10-08 NOTE — Assessment & Plan Note (Signed)
Lab Results  Component Value Date   CHOL 218 (H) 10/03/2020   HDL 45 10/03/2020   LDLCALC 154 (H) 10/03/2020   TRIG 105 10/03/2020   CHOLHDL 6.5 (H) 03/30/2019   -she is statin intolerant and has tried zetia in the past -she is not interested in starting medication today

## 2020-10-08 NOTE — Assessment & Plan Note (Signed)
-  unable to assess via telephone, but pt states this is much better and healing well

## 2020-10-08 NOTE — Progress Notes (Signed)
Acute Office Visit  Subjective:    Patient ID: Elizabeth [name], female    DOB: 08-20-1941, 80 y.o.   MRN: [id number]  Chief Complaint  Patient presents with  . Follow-up    Go over labs.     HPI Patient is in today for follow-up for leg wound. She states it is much better. She has been using bactroban instead of neosporin.  Her iron levels were low on her labs.  She has been taking her iron supplement for 4 days.  Her lipids are elevated. She is statin intolerant.  She is not interested in starting any new medication.  Past Medical History:  Diagnosis Date  . Absolute anemia 07/28/2018  . Active smoker   . Allergy   . Anxiety    Takes Xanax for anxiety  . Arthritis   . Asthma   . Cataract   . Chronic airway obstruction, not elsewhere classified   . Clotting disorder ([place])    stoke   . Depression   . Diarrhea   . Diverticulosis of colon (without mention of hemorrhage)   . Esophageal reflux   . Family history of malignant neoplasm of gastrointestinal tract   . GI bleed   . Headache(784.0)    Irregular  . Neuromuscular disorder (HCC)    numbness is left hand and left elbow  . Obesity, unspecified   . Other and unspecified hyperlipidemia   . Personal history of colonic polyps   . Stricture and stenosis of esophagus   . Stroke ([place])   . Thyroid disease   . Type II or unspecified type diabetes mellitus without mention of complication, not stated as uncontrolled   . Unspecified asthma(493.90)   . Unspecified essential hypertension     Past Surgical History:  Procedure Laterality Date  . APPENDECTOMY    . BACK SURGERY     Spinal surgery  . BRAIN SURGERY     2013 - stoke ( fall in 2010)   . CARDIAC CATHETERIZATION  2006   LAD: 30%, RCA : 20%, normal EF, elevated LVEDP  . CARDIAC CATHETERIZATION    . CEREBRAL ANGIOGRAM  12/20/2015  . COLONOSCOPY N/A 08/09/2018   Procedure: COLONOSCOPY;  Surgeon: Rogene Houston, MD;  [facility];  Service: Endoscopy;   Laterality: N/A;  1:55  . ESOPHAGEAL DILATION N/A 08/09/2018   Procedure: ESOPHAGEAL DILATION;  Surgeon: Rogene Houston, MD;  [facility];  Service: Endoscopy;  Laterality: N/A;  . ESOPHAGOGASTRODUODENOSCOPY N/A 08/09/2018   Procedure: ESOPHAGOGASTRODUODENOSCOPY (EGD);  Surgeon: Rogene Houston, MD;  [facility];  Service: Endoscopy;  Laterality: N/A;  . GIVENS CAPSULE STUDY N/A 08/25/2018   Procedure: GIVENS CAPSULE STUDY;  Surgeon: Rogene Houston, MD;  [facility];  Service: Endoscopy;  Laterality: N/A;  . IR ANGIO INTRA EXTRACRAN SEL COM CAROTID INNOMINATE BILAT MOD SED  01/14/2018  . IR ANGIO INTRA EXTRACRAN SEL COM CAROTID INNOMINATE BILAT MOD SED  05/11/2018  . IR ANGIO VERTEBRAL SEL SUBCLAVIAN INNOMINATE UNI L MOD SED  01/14/2018  . IR ANGIO VERTEBRAL SEL SUBCLAVIAN INNOMINATE UNI L MOD SED  05/11/2018  . IR ANGIO VERTEBRAL SEL VERTEBRAL UNI R MOD SED  01/14/2018  . IR ANGIO VERTEBRAL SEL VERTEBRAL UNI R MOD SED  05/11/2018  . KNEE ARTHROSCOPY     left  . LUMBAR DISC SURGERY    . POLYPECTOMY  08/09/2018   Procedure: POLYPECTOMY;  Surgeon: Rogene Houston, MD;  [facility];  Service: Endoscopy;;  ascending colon (CS x 2, HS x5) hepatic flexure (HSx1), recto-sigmoid (HSx1)  . TONSILLECTOMY    . TOTAL ABDOMINAL HYSTERECTOMY     menopause/bleeding    Family History  Problem Relation Age of Onset  . Diabetes Mother   . Heart disease Mother   . Asthma Mother   . Kidney disease Mother   . Drug abuse Mother   . Hypertension Mother   . Stroke Mother        fall -several bleed on brain Jan 01, 1989  . Early death Father        trauma  . Colon cancer Other        first cousin, paternal aunt and uncle  . Cancer Paternal Grandmother        gastric  . Diabetes Paternal Grandmother   . Mental illness Paternal Grandmother   . Heart disease Maternal Grandmother   . Thyroid cancer Other        aunt  . Heart disease Sister   . Mental illness Sister   .  Heart attack Sister   . Heart disease Brother   . Lung disease Brother   . Cancer Sister        pancreatic  . Diabetes Sister   . Hypertension Sister   . Tuberculosis Maternal Grandfather   . Heart disease Paternal Grandfather     Social History   Socioeconomic History  . Marital status: Married    Spouse name: [name]  . Number of children: 0  . Years of education: 16  . Highest education level: Bachelor's degree (e.g., BA, AB, BS)  Occupational History  . 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 Smoking status: Former Smoker    Packs/day: 1.00    Years: 20.00    Pack years: 20.00    Types: Cigarettes    Start date: 01/15/1983    Quit date: 05/03/2019    Years since quitting: 1.4  . Smokeless tobacco: Never Used  Vaping Use  . Vaping Use: Never used  Substance and Sexual Activity  . Alcohol use: No    Alcohol/week: 0.0 standard drinks  . Drug use: No  . Sexual activity: Not Currently  Other Topics Concern  . Not on file  Social History Narrative   Married [name]   Limited activity due to back pain   Social Determinants of Health   Financial Resource Strain: Not on file  Food Insecurity: Not on file  Transportation Needs: Not on file  Physical Activity: Not on file  Stress: Not on file  Social Connections: Not on file  Intimate Partner Violence: Not on file    Outpatient Medications Prior to Visit  Medication Sig Dispense Refill  . albuterol (VENTOLIN HFA) 108 (90 Base) MCG/ACT inhaler INHALE 2 PUFFS EVERY SIX HOURS AS NEEDED FOR WHEEZING OR SHORTNESS OF BREATH 8.5 g 2  . amLODipine (NORVASC) 10 MG tablet Take 1 tablet (10 mg total) by mouth daily. 90 tablet 1  . aspirin 325 MG tablet Take 325 mg by mouth daily.    . Carboxymethylcellulose Sodium 1 % GEL Place 1 drop into both eyes 2 (two) times daily.    Marland Kitchen CINNAMON PO Take 1,000 mg by mouth daily as needed (for blood sugars).     . clopidogrel (PLAVIX) 75 MG tablet TAKE ONE  TABLET BY MOUTH DAILY (PLEASE USE AUROBINDO BRAND, PT HD FOR ALLERGY TO REDDY).  Hold for 5 days 90 tablet 1  . ezetimibe (ZETIA) 10 MG tablet Take 1 tablet (10 mg total) by mouth daily. 90 tablet 3  . ferrous sulfate 325 (65 FE) MG tablet Take 1 tablet (325 mg total) by mouth daily with breakfast. 30 tablet 3  . gabapentin (NEURONTIN) 100 MG capsule TAKE ONE CAPSULE BY MOUTH AT BEDTIME 90 capsule 0  . hydrochlorothiazide (MICROZIDE) 12.5 MG capsule TAKE ONE CAPSULE BY MOUTH EVERY DAY 90 capsule 0  . pantoprazole (PROTONIX) 40 MG tablet TAKE 1 TABLET BY MOUTH DAILY BEFORE BREAKFAST 30 tablet 5  . ursodiol (ACTIGALL) 300 MG capsule TAKE ONE CAPSULE BY MOUTH THREE TIMES DAILY 90 capsule 5   No facility-administered medications prior to visit.    Allergies  Allergen Reactions  . Banana Anaphylaxis  . Codeine Nausea And Vomiting and Other (See Comments)    PROJECTILE VOMITING  . Stevia [Stevioside] Swelling and Other (See Comments)    Face, tongue, and eye swelling   . Actos [Pioglitazone] Swelling  . Benicar [Olmesartan Medoxomil] Other (See Comments)    Feel bad  . Chantix [Varenicline] Other (See Comments)    "Worked opposite."  . Diovan [Valsartan] Other (See Comments)    Feel bad  . Lipitor [Atorvastatin] Other (See Comments)    REACTION:  Joint pain  . Spinach Other (See Comments)    Stomach problems  . Vicodin [Hydrocodone-Acetaminophen] Nausea And Vomiting  . Zocor [Simvastatin] Other (See Comments)    REACTION:  "made liver function incorrectly"  . Ace Inhibitors Cough  . Celebrex [Celecoxib] Rash  . Latex Rash and Other (See Comments)    REACTION: red rash  . Metformin And Related     GI - upset   . Penicillins Rash and Other (See Comments)    Has patient had a PCN reaction causing immediate rash, facial/tongue/throat swelling, SOB or lightheadedness with hypotension: No Has patient had a PCN reaction causing severe rash involving mucus membranes or skin necrosis: No Has  patient had a PCN reaction that required hospitalization: No- MD office Has patient had a PCN reaction occurring within the last 10 years: No If all of the above answers are "NO", then may proceed with Cephalosporin use.   . Sulfonamide Derivatives Rash    Review of Systems  Constitutional: Negative.   Respiratory: Negative.   Cardiovascular: Negative.   Skin:       Leg wound is healing well.       Objective:    Physical Exam  There were no vitals taken for this visit. Wt Readings from Last 3 Encounters:  10/03/20 153 lb (69.4 kg)  09/11/20 152 lb (68.9 kg)  06/26/20 153 lb 11.2 oz (69.7 kg)    Health Maintenance Due  Topic Date Due  . COVID-19 Vaccine (1) Never done  . PNA vac Low Risk Adult (2 of 2 - PPSV23) 11/26/2014  . OPHTHALMOLOGY EXAM  05/05/2015  . FOOT EXAM  11/10/2019  . INFLUENZA VACCINE  04/08/2020  . URINE MICROALBUMIN  10/02/2020    There are no preventive care reminders to display for this patient.   Lab Results  Component Value Date   TSH 1.790 10/03/2020   Lab Results  Component Value Date   WBC 6.0 10/03/2020   HGB 10.3 (L) 10/03/2020   HCT 33.8 (L) 10/03/2020   MCV 72 (L) 10/03/2020   PLT 331 10/03/2020   Lab Results  Component Value Date   NA 136 10/03/2020   K 4.2  10/03/2020   CO2 22 10/03/2020   GLUCOSE 119 (H) 10/03/2020   BUN 12 10/03/2020   CREATININE 0.79 10/03/2020   BILITOT 0.5 10/03/2020   ALKPHOS 138 (H) 10/03/2020   AST 28 10/03/2020   ALT 22 10/03/2020   PROT 7.1 10/03/2020   ALBUMIN 4.4 10/03/2020   CALCIUM 10.1 10/03/2020   ANIONGAP 9 08/18/2018   Lab Results  Component Value Date   CHOL 218 (H) 10/03/2020   Lab Results  Component Value Date   HDL 45 10/03/2020   Lab Results  Component Value Date   LDLCALC 154 (H) 10/03/2020   Lab Results  Component Value Date   TRIG 105 10/03/2020   Lab Results  Component Value Date   CHOLHDL 6.5 (H) 03/30/2019   Lab Results  Component Value Date   HGBA1C  6.2 (H) 10/03/2020       Assessment & Plan:   Problem List Items Addressed This Visit      Endocrine   Hyperlipidemia associated with type 2 diabetes mellitus ([place])    Lab Results  Component Value Date   CHOL 218 (H) 10/03/2020   HDL 45 10/03/2020   LDLCALC 154 (H) 10/03/2020   TRIG 105 10/03/2020   CHOLHDL 6.5 (H) 03/30/2019   -she is statin intolerant and has tried zetia in the past -she is not interested in starting medication today        Other   IDA (iron deficiency anemia)    -iron levels are low -started an iron supplement  -she has had some constipation, but is doing ok now      Leg wound, right, subsequent encounter    -unable to assess via telephone, but pt states this is much better and healing well          No orders of the defined types were placed in this encounter.  Date:  10/08/2020   Location of Patient: Home Location of Provider: Office Consent was obtain for visit to be over via telehealth. I verified that I am speaking with the correct person using two identifiers.  I connected with  Shajuan Musso Langi on 10/08/20 via telephone and verified that I am speaking with the correct person using two identifiers.   I discussed the limitations of evaluation and management by telemedicine. The patient expressed understanding and agreed to proceed.  Time spent: 9 min   Noreene Larsson, NP

## 2020-10-09 ENCOUNTER — Other Ambulatory Visit (HOSPITAL_COMMUNITY): Payer: Self-pay | Admitting: Interventional Radiology

## 2020-10-09 DIAGNOSIS — I671 Cerebral aneurysm, nonruptured: Secondary | ICD-10-CM

## 2020-10-23 ENCOUNTER — Ambulatory Visit (HOSPITAL_COMMUNITY)
Admission: RE | Admit: 2020-10-23 | Discharge: 2020-10-23 | Disposition: A | Discharge status: Home / Self Care | Payer: HMO | Source: Ambulatory Visit | Attending: Interventional Radiology | Admitting: Interventional Radiology

## 2020-10-23 ENCOUNTER — Other Ambulatory Visit: Payer: Self-pay

## 2020-10-23 DIAGNOSIS — I671 Cerebral aneurysm, nonruptured: Secondary | ICD-10-CM | POA: Insufficient documentation

## 2020-10-29 ENCOUNTER — Telehealth (HOSPITAL_COMMUNITY): Payer: Self-pay

## 2020-10-29 NOTE — Telephone Encounter (Signed)
Pt agreed to f/u in 6 months with cta head/neck. AW 

## 2020-11-05 ENCOUNTER — Ambulatory Visit (INDEPENDENT_AMBULATORY_CARE_PROVIDER_SITE_OTHER): Payer: HMO | Admitting: Nurse Practitioner

## 2020-11-05 ENCOUNTER — Encounter: Payer: Self-pay | Admitting: Nurse Practitioner

## 2020-11-05 ENCOUNTER — Other Ambulatory Visit: Payer: Self-pay

## 2020-11-05 VITALS — BP 128/88 | HR 84 | Temp 97.8°F | Resp 20 | Ht 62.0 in | Wt 152.0 lb

## 2020-11-05 DIAGNOSIS — D509 Iron deficiency anemia, unspecified: Secondary | ICD-10-CM | POA: Diagnosis not present

## 2020-11-05 NOTE — Assessment & Plan Note (Addendum)
-  she states she can only take iron for a week before she gets constipated -her last dose was taken 3 days ago -if no major improvement, will consider hematology consult to consider IV iron infusion

## 2020-11-05 NOTE — Progress Notes (Signed)
Acute Office Visit  Subjective:    Patient ID: Elizabeth [name], female    DOB: 04/05/1941, 80 y.o.   MRN: [id number]  Chief Complaint  Patient presents with  . Anemia    Follow up.     HPI Patient is in today for follow-up for anemia.  At her last visit, her iron panel showed that she was iron deficient. She has been taking her iron supplement for a month now.  Past Medical History:  Diagnosis Date  . Absolute anemia 07/28/2018  . Active smoker   . Allergy   . Anxiety    Takes Xanax for anxiety  . Arthritis   . Asthma   . Cataract   . Chronic airway obstruction, not elsewhere classified   . Clotting disorder ([place])    stoke   . Depression   . Diarrhea   . Diverticulosis of colon (without mention of hemorrhage)   . Esophageal reflux   . Family history of malignant neoplasm of gastrointestinal tract   . GI bleed   . Headache(784.0)    Irregular  . Neuromuscular disorder ([facility])    numbness is left hand and left elbow  . Obesity, unspecified   . Other and unspecified hyperlipidemia   . Personal history of colonic polyps   . Stricture and stenosis of esophagus   . Stroke ([place])   . Thyroid disease   . Type II or unspecified type diabetes mellitus without mention of complication, not stated as uncontrolled   . Unspecified asthma(493.90)   . Unspecified essential hypertension     Past Surgical History:  Procedure Laterality Date  . APPENDECTOMY    . BACK SURGERY     Spinal surgery  . BRAIN SURGERY     2013 - [place] ( fall in 2010)   . CARDIAC CATHETERIZATION  2006   LAD: 30%, RCA : 20%, normal EF, elevated LVEDP  . CARDIAC CATHETERIZATION    . CEREBRAL ANGIOGRAM  12/20/2015  . COLONOSCOPY N/A 08/09/2018   Procedure: COLONOSCOPY;  Surgeon: Rogene Houston, MD;  [facility];  Service: Endoscopy;  Laterality: N/A;  1:55  . ESOPHAGEAL DILATION N/A 08/09/2018   Procedure: ESOPHAGEAL DILATION;  Surgeon: Rogene Houston, MD;  [facility];  Service:  Endoscopy;  Laterality: N/A;  . ESOPHAGOGASTRODUODENOSCOPY N/A 08/09/2018   Procedure: ESOPHAGOGASTRODUODENOSCOPY (EGD);  Surgeon: Rogene Houston, MD;  [facility];  Service: Endoscopy;  Laterality: N/A;  . GIVENS CAPSULE STUDY N/A 08/25/2018   Procedure: GIVENS CAPSULE STUDY;  Surgeon: Rogene Houston, MD;  [facility];  Service: Endoscopy;  Laterality: N/A;  . IR ANGIO INTRA EXTRACRAN SEL COM CAROTID INNOMINATE BILAT MOD SED  01/14/2018  . IR ANGIO INTRA EXTRACRAN SEL COM CAROTID INNOMINATE BILAT MOD SED  05/11/2018  . IR ANGIO VERTEBRAL SEL SUBCLAVIAN INNOMINATE UNI L MOD SED  01/14/2018  . IR ANGIO VERTEBRAL SEL SUBCLAVIAN INNOMINATE UNI L MOD SED  05/11/2018  . IR ANGIO VERTEBRAL SEL VERTEBRAL UNI R MOD SED  01/14/2018  . IR ANGIO VERTEBRAL SEL VERTEBRAL UNI R MOD SED  05/11/2018  . KNEE ARTHROSCOPY     left  . LUMBAR DISC SURGERY    . POLYPECTOMY  08/09/2018   Procedure: POLYPECTOMY;  Surgeon: Rogene Houston, MD;  [facility];  Service: Endoscopy;;  ascending colon (CS x 2, HS x5) hepatic flexure (HSx1), recto-sigmoid (HSx1)  . TONSILLECTOMY    . TOTAL ABDOMINAL HYSTERECTOMY  menopause/bleeding    Family History  Problem Relation Age of Onset  . Diabetes Mother   . Heart disease Mother   . Asthma Mother   . Kidney disease Mother   . Drug abuse Mother   . Hypertension Mother   . Stroke Mother        fall -several bleed on brain 11-13-88  . Early death Father        trauma  . Colon cancer Other        first cousin, paternal aunt and uncle  . Cancer Paternal Grandmother        gastric  . Diabetes Paternal Grandmother   . Mental illness Paternal Grandmother   . Heart disease Maternal Grandmother   . Thyroid cancer Other        aunt  . Heart disease Sister   . Mental illness Sister   . Heart attack Sister   . Heart disease Brother   . Lung disease Brother   . Cancer Sister        pancreatic  . Diabetes Sister   . Hypertension Sister   .  Tuberculosis Maternal Grandfather   . Heart disease Paternal Grandfather     Social History   Socioeconomic History  . Marital status: Married    Spouse name: [name]  . Number of children: 0  . Years of education: 16  . Highest education level: Bachelor's degree (e.g., BA, AB, BS)  Occupational History  . 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 Smoking status: Former Smoker    Packs/day: 1.00    Years: 20.00    Pack years: 20.00    Types: Cigarettes    Start date: 01/15/1983    Quit date: 05/03/2019    Years since quitting: 1.5  . Smokeless tobacco: Never Used  Vaping Use  . Vaping Use: Never used  Substance and Sexual Activity  . Alcohol use: No    Alcohol/week: 0.0 standard drinks  . Drug use: No  . Sexual activity: Not Currently  Other Topics Concern  . Not on file  Social History Narrative   Married [name]   Limited activity due to back pain   Social Determinants of Health   Financial Resource Strain: Not on file  Food Insecurity: Not on file  Transportation Needs: Not on file  Physical Activity: Not on file  Stress: Not on file  Social Connections: Not on file  Intimate Partner Violence: Not on file    Outpatient Medications Prior to Visit  Medication Sig Dispense Refill  . albuterol (VENTOLIN HFA) 108 (90 Base) MCG/ACT inhaler INHALE 2 PUFFS EVERY SIX HOURS AS NEEDED FOR WHEEZING OR SHORTNESS OF BREATH 8.5 g 2  . amLODipine (NORVASC) 10 MG tablet Take 1 tablet (10 mg total) by mouth daily. 90 tablet 1  . aspirin 325 MG tablet Take 325 mg by mouth daily.    . Carboxymethylcellulose Sodium 1 % GEL Place 1 drop into both eyes 2 (two) times daily.    Marland Kitchen CINNAMON PO Take 1,000 mg by mouth daily as needed (for blood sugars).     . clopidogrel (PLAVIX) 75 MG tablet TAKE ONE TABLET BY MOUTH DAILY (PLEASE USE AUROBINDO BRAND, PT HD FOR ALLERGY TO REDDY). Hold for 5 days 90 tablet 1  . ezetimibe (ZETIA) 10 MG tablet Take 1 tablet (10  mg total) by mouth daily. 90 tablet 3  . ferrous sulfate 325 (65 FE)  MG tablet Take 1 tablet (325 mg total) by mouth daily with breakfast. 30 tablet 3  . gabapentin (NEURONTIN) 100 MG capsule TAKE ONE CAPSULE BY MOUTH AT BEDTIME 90 capsule 0  . hydrochlorothiazide (MICROZIDE) 12.5 MG capsule TAKE ONE CAPSULE BY MOUTH EVERY DAY 90 capsule 0  . pantoprazole (PROTONIX) 40 MG tablet TAKE 1 TABLET BY MOUTH DAILY BEFORE BREAKFAST 30 tablet 5  . ursodiol (ACTIGALL) 300 MG capsule TAKE ONE CAPSULE BY MOUTH THREE TIMES DAILY 90 capsule 5   No facility-administered medications prior to visit.    Allergies  Allergen Reactions  . Banana Anaphylaxis  . Codeine Nausea And Vomiting and Other (See Comments)    PROJECTILE VOMITING  . Stevia [Stevioside] Swelling and Other (See Comments)    Face, tongue, and eye swelling   . Actos [Pioglitazone] Swelling  . Benicar [Olmesartan Medoxomil] Other (See Comments)    Feel bad  . Chantix [Varenicline] Other (See Comments)    "Worked opposite."  . Diovan [Valsartan] Other (See Comments)    Feel bad  . Lipitor [Atorvastatin] Other (See Comments)    REACTION:  Joint pain  . Spinach Other (See Comments)    Stomach problems  . Vicodin [Hydrocodone-Acetaminophen] Nausea And Vomiting  . Zocor [Simvastatin] Other (See Comments)    REACTION:  "made liver function incorrectly"  . Ace Inhibitors Cough  . Celebrex [Celecoxib] Rash  . Latex Rash and Other (See Comments)    REACTION: red rash  . Metformin And Related     GI - upset   . Penicillins Rash and Other (See Comments)    Has patient had a PCN reaction causing immediate rash, facial/tongue/throat swelling, SOB or lightheadedness with hypotension: No Has patient had a PCN reaction causing severe rash involving mucus membranes or skin necrosis: No Has patient had a PCN reaction that required hospitalization: No- MD office Has patient had a PCN reaction occurring within the last 10 years: No If all of the  above answers are "NO", then may proceed with Cephalosporin use.   . Sulfonamide Derivatives Rash    Review of Systems  Constitutional: Negative.   Respiratory: Negative.   Cardiovascular: Negative.   Musculoskeletal: Negative.   Psychiatric/Behavioral: Negative.        Objective:    Physical Exam Constitutional:      Appearance: Normal appearance.  Cardiovascular:     Rate and Rhythm: Normal rate and regular rhythm.     Pulses: Normal pulses.     Heart sounds: Normal heart sounds.  Pulmonary:     Effort: Pulmonary effort is normal.     Breath sounds: Normal breath sounds.  Musculoskeletal:        General: Normal range of motion.  Neurological:     Mental Status: She is alert.  Psychiatric:        Mood and Affect: Mood normal.        Behavior: Behavior normal.        Thought Content: Thought content normal.        Judgment: Judgment normal.     BP 128/88   Pulse 84   Temp 97.8 F (36.6 C)   Resp 20   Ht '5\' 2"'  (1.575 m)   Wt 152 lb (68.9 kg)   SpO2 96%   BMI 27.80 kg/m  Wt Readings from Last 3 Encounters:  11/05/20 152 lb (68.9 kg)  10/03/20 153 lb (69.4 kg)  09/11/20 152 lb (68.9 kg)    Health Maintenance Due  Topic  Date Due  . COVID-19 Vaccine (1) Never done  . PNA vac Low Risk Adult (2 of 2 - PPSV23) 11/26/2014  . OPHTHALMOLOGY EXAM  05/05/2015  . FOOT EXAM  11/10/2019  . INFLUENZA VACCINE  04/08/2020  . URINE MICROALBUMIN  10/02/2020    There are no preventive care reminders to display for this patient.   Lab Results  Component Value Date   TSH 1.790 10/03/2020   Lab Results  Component Value Date   WBC 6.0 10/03/2020   HGB 10.3 (L) 10/03/2020   HCT 33.8 (L) 10/03/2020   MCV 72 (L) 10/03/2020   PLT 331 10/03/2020   Lab Results  Component Value Date   NA 136 10/03/2020   K 4.2 10/03/2020   CO2 22 10/03/2020   GLUCOSE 119 (H) 10/03/2020   BUN 12 10/03/2020   CREATININE 0.79 10/03/2020   BILITOT 0.5 10/03/2020   ALKPHOS 138 (H)  10/03/2020   AST 28 10/03/2020   ALT 22 10/03/2020   PROT 7.1 10/03/2020   ALBUMIN 4.4 10/03/2020   CALCIUM 10.1 10/03/2020   ANIONGAP 9 08/18/2018   Lab Results  Component Value Date   CHOL 218 (H) 10/03/2020   Lab Results  Component Value Date   HDL 45 10/03/2020   Lab Results  Component Value Date   LDLCALC 154 (H) 10/03/2020   Lab Results  Component Value Date   TRIG 105 10/03/2020   Lab Results  Component Value Date   CHOLHDL 6.5 (H) 03/30/2019   Lab Results  Component Value Date   HGBA1C 6.2 (H) 10/03/2020       Assessment & Plan:   Problem List Items Addressed This Visit      Other   Iron deficiency anemia - Primary    -she states she can only take iron for a week before she gets constipated -her last dose was taken 3 days ago -if no major improvement, will consider hematology consult to consider IV iron infusion      Relevant Orders   CBC with Differential/Platelet   CMP14+EGFR   Iron, TIBC and Ferritin Panel       No orders of the defined types were placed in this encounter.    Noreene Larsson, NP

## 2020-11-06 ENCOUNTER — Other Ambulatory Visit: Payer: Self-pay | Admitting: Nurse Practitioner

## 2020-11-06 DIAGNOSIS — D509 Iron deficiency anemia, unspecified: Secondary | ICD-10-CM

## 2020-11-06 NOTE — Progress Notes (Signed)
I sent in a referral to hematology for her since she still has iron deficiency.

## 2020-11-07 LAB — CBC WITH DIFFERENTIAL/PLATELET
Basophils Absolute: 0.1 10*3/uL (ref 0.0–0.2)
Basos: 1 %
EOS (ABSOLUTE): 0.1 10*3/uL (ref 0.0–0.4)
Eos: 2 %
Hematocrit: 41.3 % (ref 34.0–46.6)
Hemoglobin: 13 g/dL (ref 11.1–15.9)
Immature Grans (Abs): 0 10*3/uL (ref 0.0–0.1)
Immature Granulocytes: 0 %
Lymphocytes Absolute: 1.6 10*3/uL (ref 0.7–3.1)
Lymphs: 29 %
MCH: 23.8 pg — ABNORMAL LOW (ref 26.6–33.0)
MCHC: 31.5 g/dL (ref 31.5–35.7)
MCV: 76 fL — ABNORMAL LOW (ref 79–97)
Monocytes Absolute: 0.4 10*3/uL (ref 0.1–0.9)
Monocytes: 7 %
Neutrophils Absolute: 3.2 10*3/uL (ref 1.4–7.0)
Neutrophils: 61 %
Platelets: 252 10*3/uL (ref 150–450)
RBC: 5.47 x10E6/uL — ABNORMAL HIGH (ref 3.77–5.28)
RDW: 21 % — ABNORMAL HIGH (ref 11.7–15.4)
WBC: 5.3 10*3/uL (ref 3.4–10.8)

## 2020-11-07 LAB — CMP14+EGFR
ALT: 23 IU/L (ref 0–32)
AST: 27 IU/L (ref 0–40)
Albumin/Globulin Ratio: 1.6 (ref 1.2–2.2)
Albumin: 4.4 g/dL (ref 3.7–4.7)
Alkaline Phosphatase: 146 IU/L — ABNORMAL HIGH (ref 44–121)
BUN/Creatinine Ratio: 15 (ref 12–28)
BUN: 12 mg/dL (ref 8–27)
Bilirubin Total: 0.4 mg/dL (ref 0.0–1.2)
CO2: 22 mmol/L (ref 20–29)
Calcium: 10 mg/dL (ref 8.7–10.3)
Chloride: 101 mmol/L (ref 96–106)
Creatinine, Ser: 0.81 mg/dL (ref 0.57–1.00)
Globulin, Total: 2.8 g/dL (ref 1.5–4.5)
Glucose: 125 mg/dL — ABNORMAL HIGH (ref 65–99)
Potassium: 4.2 mmol/L (ref 3.5–5.2)
Sodium: 137 mmol/L (ref 134–144)
Total Protein: 7.2 g/dL (ref 6.0–8.5)
eGFR: 75 mL/min/{1.73_m2} (ref >59)

## 2020-11-07 LAB — IRON,TIBC AND FERRITIN PANEL
Ferritin: 18 ng/mL (ref 15–150)
Iron Saturation: 7 % — CL (ref 15–55)
Iron: 32 ug/dL (ref 27–139)
Total Iron Binding Capacity: 438 ug/dL (ref 250–450)
UIBC: 406 ug/dL — ABNORMAL HIGH (ref 118–369)

## 2020-11-12 NOTE — Progress Notes (Signed)
CONSULT NOTE  Patient Care Team: Noreene Larsson, NP as PCP - General (Nurse Practitioner) Particia Nearing, [place] (Optometry) Rogene Houston, MD as Consulting Physician (Gastroenterology) Luanne Bras, MD as Consulting Physician (Interventional Radiology)  CHIEF COMPLAINTS/PURPOSE OF CONSULTATION: Anemia  HISTORY OF PRESENTING ILLNESS:  Elizabeth [name] 80 y.o. female is here because of low iron levels; hemoglobin 13.0, ferritin 18, iron saturation 7% and MCV 76. The rest of her CBC was normal.   She has a PMH significant for hypertension, CVA (2013), COPD, hiatial hernia, GERD, esophageal stricture, thyroid disease, type 2 diabetes is, tobacco abuse, obesity, anxiety and depression and iron deficiency anemia with history of GI bleed who is referred by her PCP for low iron levels.   Per chart review, she became anemic in 2019 with intermittent low counts since. She was hospitalized in December 2019 for GI bleed with hemoglobin of 7.2. She was given 2 unit PRBC with recovery of her hemoglobin to 10.7 at discharge. She had colonoscopy and EGD while admitted which showed a 3 cm hiatal hernia but otherwise unremarkable. Her colonoscopy revealed 6-8 polyps that were removed. There was evidence of diverticulosis. Since this episode, hemoglobin has been stable between 10-13.   She had imaging of her abdomen on 07/02/2020 which showed fatty liver but otherwise unremarkable. Spleen within normal limits.  She can not tolerate oral iron secondary to constipation.   She is on Plavix and aspirin for history of CVA.  Denies any OTC NSAID ingestion.  No personal hx of cancer.  Has family history of pancreatic cancer (sister) and breast cancer on both sides of her family.  She is a former smoker and has smoked intermittently since she was 80 years old.  She smoked smoking 1 pack/day.  She quit 3 days ago.  Currently denies any chest pain, shortness of breath, palpitations or episodes.  She feels well.   Does admit to some weight loss couple years.  Denies any B-symptoms.  Denies pica and eats a variety of foods.  Denies any abdominal complaints other than constipation with iron.  Has generalized joint pain secondary to "old age".    MEDICAL HISTORY:  Past Medical History:  Diagnosis Date  . Absolute anemia 07/28/2018  . Active smoker   . Allergy   . Anxiety    Takes Xanax for anxiety  . Arthritis   . Asthma   . Cataract   . Chronic airway obstruction, not elsewhere classified   . Clotting disorder ([place])    stoke   . Depression   . Diarrhea   . Diverticulosis of colon (without mention of hemorrhage)   . Esophageal reflux   . Family history of malignant neoplasm of gastrointestinal tract   . GI bleed   . Headache(784.0)    Irregular  . Neuromuscular disorder ([facility])    numbness is left hand and left elbow  . Obesity, unspecified   . Other and unspecified hyperlipidemia   . Personal history of colonic polyps   . Stricture and stenosis of esophagus   . Stroke ([place])   . Thyroid disease   . Type II or unspecified type diabetes mellitus without mention of complication, not stated as uncontrolled   . Unspecified asthma(493.90)   . Unspecified essential hypertension     SURGICAL HISTORY: Past Surgical History:  Procedure Laterality Date  . APPENDECTOMY    . BACK SURGERY     Spinal surgery  . BRAIN SURGERY     2013 - stoke (  fall in 2010)   . CARDIAC CATHETERIZATION  2006   LAD: 30%, RCA : 20%, normal EF, elevated LVEDP  . CARDIAC CATHETERIZATION    . CEREBRAL ANGIOGRAM  12/20/2015  . COLONOSCOPY N/A 08/09/2018   Procedure: COLONOSCOPY;  Surgeon: Rogene Houston, MD;  [facility];  Service: Endoscopy;  Laterality: N/A;  1:55  . ESOPHAGEAL DILATION N/A 08/09/2018   Procedure: ESOPHAGEAL DILATION;  Surgeon: Rogene Houston, MD;  [facility];  Service: Endoscopy;  Laterality: N/A;  . ESOPHAGOGASTRODUODENOSCOPY N/A 08/09/2018   Procedure:  ESOPHAGOGASTRODUODENOSCOPY (EGD);  Surgeon: Rogene Houston, MD;  [facility];  Service: Endoscopy;  Laterality: N/A;  . GIVENS CAPSULE STUDY N/A 08/25/2018   Procedure: GIVENS CAPSULE STUDY;  Surgeon: Rogene Houston, MD;  [facility];  Service: Endoscopy;  Laterality: N/A;  . IR ANGIO INTRA EXTRACRAN SEL COM CAROTID INNOMINATE BILAT MOD SED  01/14/2018  . IR ANGIO INTRA EXTRACRAN SEL COM CAROTID INNOMINATE BILAT MOD SED  05/11/2018  . IR ANGIO VERTEBRAL SEL SUBCLAVIAN INNOMINATE UNI L MOD SED  01/14/2018  . IR ANGIO VERTEBRAL SEL SUBCLAVIAN INNOMINATE UNI L MOD SED  05/11/2018  . IR ANGIO VERTEBRAL SEL VERTEBRAL UNI R MOD SED  01/14/2018  . IR ANGIO VERTEBRAL SEL VERTEBRAL UNI R MOD SED  05/11/2018  . KNEE ARTHROSCOPY     left  . LUMBAR DISC SURGERY    . POLYPECTOMY  08/09/2018   Procedure: POLYPECTOMY;  Surgeon: Rogene Houston, MD;  [facility];  Service: Endoscopy;;  ascending colon (CS x 2, HS x5) hepatic flexure (HSx1), recto-sigmoid (HSx1)  . TONSILLECTOMY    . TOTAL ABDOMINAL HYSTERECTOMY     menopause/bleeding    SOCIAL HISTORY: Social History   Socioeconomic History  . Marital status: Married    Spouse name: [name]  . Number of children: 0  . Years of education: 16  . Highest education level: Bachelor's degree (e.g., BA, AB, BS)  Occupational History  . 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 Smoking status: Former Smoker    Packs/day: 1.00    Years: 20.00    Pack years: 20.00    Types: Cigarettes    Start date: 01/15/1983    Quit date: 11/09/2020    Years since quitting: 0.0  . Smokeless tobacco: Never Used  Vaping Use  . Vaping Use: Never used  Substance and Sexual Activity  . Alcohol use: No    Alcohol/week: 0.0 standard drinks  . Drug use: No  . Sexual activity: Not Currently  Other Topics Concern  . Not on file  Social History Narrative   Married [name]   Limited activity due to back pain    Social Determinants of Health   Financial Resource Strain: Low Risk   . Difficulty of Paying Living Expenses: Not hard at all  Food Insecurity: No Food Insecurity  . Worried About Charity fundraiser in the Last Year: Never true  . Ran Out of Food in the Last Year: Never true  Transportation Needs: No Transportation Needs  . Lack of Transportation (Medical): No  . Lack of Transportation (Non-Medical): No  Physical Activity: Insufficiently Active  . Days of Exercise per Week: 3 days  . Minutes of Exercise per Session: 30 min  Stress: No Stress Concern Present  . Feeling of Stress : Not at all  Social Connections: Moderately Isolated  .  Frequency of Communication with Friends and Family: More than three times a week  . Frequency of Social Gatherings with Friends and Family: More than three times a week  . Attends Religious Services: Never  . Active Member of Clubs or Organizations: No  . Attends Archivist Meetings: Never  . Marital Status: Married  Human resources officer Violence: Not At Risk  . Fear of Current or Ex-Partner: No  . Emotionally Abused: No  . Physically Abused: No  . Sexually Abused: No    FAMILY HISTORY: Family History  Problem Relation Age of Onset  . Diabetes Mother   . Heart disease Mother   . Asthma Mother   . Kidney disease Mother   . Drug abuse Mother   . Hypertension Mother   . Stroke Mother        fall -several bleed on brain 11/12/88  . Early death Father        trauma  . Colon cancer Other        first cousin, paternal aunt and uncle  . Cancer Paternal Grandmother        gastric  . Diabetes Paternal Grandmother   . Mental illness Paternal Grandmother   . Heart disease Maternal Grandmother   . Thyroid cancer Other        aunt  . Heart disease Sister   . Mental illness Sister   . Heart attack Sister   . Heart disease Brother   . Lung disease Brother   . Cancer Sister        pancreatic  . Diabetes Sister   . Hypertension Sister   .  Tuberculosis Maternal Grandfather   . Heart disease Paternal Grandfather     ALLERGIES:  is allergic to banana, codeine, stevia [stevioside], actos [pioglitazone], benicar [olmesartan medoxomil], chantix [varenicline], diovan [valsartan], lipitor [atorvastatin], spinach, vicodin [hydrocodone-acetaminophen], zocor [simvastatin], ace inhibitors, celebrex [celecoxib], latex, metformin and related, penicillins, and sulfonamide derivatives.  MEDICATIONS:  Current Outpatient Medications  Medication Sig Dispense Refill  . amLODipine (NORVASC) 10 MG tablet Take 1 tablet (10 mg total) by mouth daily. 90 tablet 1  . aspirin 325 MG tablet Take 325 mg by mouth daily.    . Carboxymethylcellulose Sodium 1 % GEL Place 1 drop into both eyes 2 (two) times daily.    Marland Kitchen CINNAMON PO Take 1,000 mg by mouth daily as needed (for blood sugars).     . clopidogrel (PLAVIX) 75 MG tablet TAKE ONE TABLET BY MOUTH DAILY (PLEASE USE AUROBINDO BRAND, PT HD FOR ALLERGY TO REDDY). Hold for 5 days 90 tablet 1  . gabapentin (NEURONTIN) 100 MG capsule TAKE ONE CAPSULE BY MOUTH AT BEDTIME 90 capsule 0  . hydrochlorothiazide (MICROZIDE) 12.5 MG capsule TAKE ONE CAPSULE BY MOUTH EVERY DAY 90 capsule 0  . JARDIANCE 10 MG TABS tablet TAKE 1 TABLET BY MOUTH ONCE DAILY (TO replace farxiga)    . pantoprazole (PROTONIX) 40 MG tablet TAKE 1 TABLET BY MOUTH DAILY BEFORE BREAKFAST 30 tablet 5  . ursodiol (ACTIGALL) 300 MG capsule TAKE ONE CAPSULE BY MOUTH THREE TIMES DAILY 90 capsule 5  . albuterol (VENTOLIN HFA) 108 (90 Base) MCG/ACT inhaler INHALE 2 PUFFS EVERY SIX HOURS AS NEEDED FOR WHEEZING OR SHORTNESS OF BREATH (Patient not taking: Reported on 11/13/2020) 8.5 g 2   No current facility-administered medications for this visit.    REVIEW OF SYSTEMS:   Review of Systems  Gastrointestinal: Positive for constipation.  Musculoskeletal: Positive for joint pain.  PHYSICAL EXAMINATION: ECOG PERFORMANCE STATUS: 0 - Asymptomatic  Vitals:    11/13/20 0815 11/13/20 0957  BP:  (!) 151/72  Pulse: 84   Resp: 18   Temp: (!) 97 F (36.1 C)   SpO2: 100%    Filed Weights   11/13/20 0815  Weight: 151 lb 12.8 oz (68.9 kg)    Physical Exam Constitutional:      General: Vital signs are normal.     Appearance: Normal appearance.  HENT:     Head: Normocephalic and atraumatic.  Eyes:     Pupils: Pupils are equal, round, and reactive to light.  Cardiovascular:     Rate and Rhythm: Normal rate and regular rhythm.     Heart sounds: Normal heart sounds. No murmur heard.   Pulmonary:     Effort: Pulmonary effort is normal.     Breath sounds: Normal breath sounds. No wheezing.  Abdominal:     General: Bowel sounds are normal. There is no distension.     Palpations: Abdomen is soft.     Tenderness: There is no abdominal tenderness.  Musculoskeletal:        General: No edema. Normal range of motion.     Cervical back: Normal range of motion.  Skin:    General: Skin is warm and dry.     Findings: No rash.  Neurological:     Mental Status: She is alert and oriented to person, place, and time.  Psychiatric:        Judgment: Judgment normal.     LABORATORY DATA:  I have reviewed the data as listed Recent Results (from the past 2160 hour(s))  CBC with Differential/Platelet     Status: Abnormal   Collection Time: 10/03/20  8:38 AM  Result Value Ref Range   WBC 6.0 3.4 - 10.8 x10E3/uL   RBC 4.70 3.77 - 5.28 x10E6/uL   Hemoglobin 10.3 (L) 11.1 - 15.9 g/dL   Hematocrit 33.8 (L) 34.0 - 46.6 %   MCV 72 (L) 79 - 97 fL   MCH 21.9 (L) 26.6 - 33.0 pg   MCHC 30.5 (L) 31.5 - 35.7 g/dL   RDW 15.3 11.7 - 15.4 %   Platelets 331 150 - 450 x10E3/uL   Neutrophils 58 Not Estab. %   Lymphs 31 Not Estab. %   Monocytes 7 Not Estab. %   Eos 3 Not Estab. %   Basos 1 Not Estab. %   Neutrophils Absolute 3.5 1.4 - 7.0 x10E3/uL   Lymphocytes Absolute 1.9 0.7 - 3.1 x10E3/uL   Monocytes Absolute 0.4 0.1 - 0.9 x10E3/uL   EOS (ABSOLUTE) 0.2  0.0 - 0.4 x10E3/uL   Basophils Absolute 0.1 0.0 - 0.2 x10E3/uL   Immature Granulocytes 0 Not Estab. %   Immature Grans (Abs) 0.0 0.0 - 0.1 x10E3/uL  CMP14+EGFR     Status: Abnormal   Collection Time: 10/03/20  8:38 AM  Result Value Ref Range   Glucose 119 (H) 65 - 99 mg/dL   BUN 12 8 - 27 mg/dL   Creatinine, Ser 0.79 0.57 - 1.00 mg/dL   GFR calc non Af Amer 72 >59 mL/min/1.73   GFR calc Af Amer 84 >59 mL/min/1.73    Comment: **In accordance with recommendations from the NKF-ASN Task force,**   [facility] is in the process of updating its eGFR calculation to the   2021 CKD-EPI creatinine equation that estimates kidney function   without a race variable.    BUN/Creatinine Ratio 15 12 - 28  Sodium 136 134 - 144 mmol/L   Potassium 4.2 3.5 - 5.2 mmol/L   Chloride 99 96 - 106 mmol/L   CO2 22 20 - 29 mmol/L   Calcium 10.1 8.7 - 10.3 mg/dL   Total Protein 7.1 6.0 - 8.5 g/dL   Albumin 4.4 3.7 - 4.7 g/dL   Globulin, Total 2.7 1.5 - 4.5 g/dL   Albumin/Globulin Ratio 1.6 1.2 - 2.2   Bilirubin Total 0.5 0.0 - 1.2 mg/dL   Alkaline Phosphatase 138 (H) 44 - 121 IU/L   AST 28 0 - 40 IU/L   ALT 22 0 - 32 IU/L  TSH + free T4     Status: None   Collection Time: 10/03/20  8:38 AM  Result Value Ref Range   TSH 1.790 0.450 - 4.500 uIU/mL   Free T4 1.24 0.82 - 1.77 ng/dL  Lipid Panel With LDL/HDL Ratio     Status: Abnormal   Collection Time: 10/03/20  8:38 AM  Result Value Ref Range   Cholesterol, Total 218 (H) 100 - 199 mg/dL   Triglycerides 105 0 - 149 mg/dL   HDL 45 >39 mg/dL   VLDL Cholesterol Cal 19 5 - 40 mg/dL   LDL Chol Calc (NIH) 154 (H) 0 - 99 mg/dL   LDL/HDL Ratio 3.4 (H) 0.0 - 3.2 ratio    Comment:                                     LDL/HDL Ratio                                             Men  Women                               1/2 Avg.Risk  1.0    1.5                                   Avg.Risk  3.6    3.2                                2X Avg.Risk  6.2    5.0                                 3X Avg.Risk  8.0    6.1   Hemoglobin A1c     Status: Abnormal   Collection Time: 10/03/20  8:38 AM  Result Value Ref Range   Hgb A1c MFr Bld 6.2 (H) 4.8 - 5.6 %    Comment:          Prediabetes: 5.7 - 6.4          Diabetes: >6.4          Glycemic control for adults with diabetes: <7.0    Est. average glucose Bld gHb Est-mCnc 131 mg/dL  Fe+TIBC+Fer     Status: Abnormal   Collection Time: 10/03/20  8:38 AM  Result Value Ref Range   Total Iron Binding Capacity 494 (H) 250 - 450 ug/dL  UIBC 467 (H) 118 - 369 ug/dL   Iron 27 27 - 139 ug/dL   Iron Saturation 5 (LL) 15 - 55 %   Ferritin 10 (L) 15 - 150 ng/mL  CBC with Differential/Platelet     Status: Abnormal   Collection Time: 11/05/20  3:05 PM  Result Value Ref Range   WBC 5.3 3.4 - 10.8 x10E3/uL   RBC 5.47 (H) 3.77 - 5.28 x10E6/uL   Hemoglobin 13.0 11.1 - 15.9 g/dL   Hematocrit 41.3 34.0 - 46.6 %   MCV 76 (L) 79 - 97 fL   MCH 23.8 (L) 26.6 - 33.0 pg   MCHC 31.5 31.5 - 35.7 g/dL   RDW 21.0 (H) 11.7 - 15.4 %   Platelets 252 150 - 450 x10E3/uL   Neutrophils 61 Not Estab. %   Lymphs 29 Not Estab. %   Monocytes 7 Not Estab. %   Eos 2 Not Estab. %   Basos 1 Not Estab. %   Neutrophils Absolute 3.2 1.4 - 7.0 x10E3/uL   Lymphocytes Absolute 1.6 0.7 - 3.1 x10E3/uL   Monocytes Absolute 0.4 0.1 - 0.9 x10E3/uL   EOS (ABSOLUTE) 0.1 0.0 - 0.4 x10E3/uL   Basophils Absolute 0.1 0.0 - 0.2 x10E3/uL   Immature Granulocytes 0 Not Estab. %   Immature Grans (Abs) 0.0 0.0 - 0.1 x10E3/uL  CMP14+EGFR     Status: Abnormal   Collection Time: 11/05/20  3:05 PM  Result Value Ref Range   Glucose 125 (H) 65 - 99 mg/dL   BUN 12 8 - 27 mg/dL   Creatinine, Ser 0.81 0.57 - 1.00 mg/dL   eGFR 75 >59 mL/min/1.73    Comment: **In accordance with recommendations from the NKF-ASN Task force,**   [facility] has updated its eGFR calculation to the 2021 CKD-EPI   creatinine equation that estimates kidney function without a race   variable.     BUN/Creatinine Ratio 15 12 - 28   Sodium 137 134 - 144 mmol/L   Potassium 4.2 3.5 - 5.2 mmol/L   Chloride 101 96 - 106 mmol/L   CO2 22 20 - 29 mmol/L   Calcium 10.0 8.7 - 10.3 mg/dL   Total Protein 7.2 6.0 - 8.5 g/dL   Albumin 4.4 3.7 - 4.7 g/dL   Globulin, Total 2.8 1.5 - 4.5 g/dL   Albumin/Globulin Ratio 1.6 1.2 - 2.2   Bilirubin Total 0.4 0.0 - 1.2 mg/dL   Alkaline Phosphatase 146 (H) 44 - 121 IU/L   AST 27 0 - 40 IU/L   ALT 23 0 - 32 IU/L  Iron, TIBC and Ferritin Panel     Status: Abnormal   Collection Time: 11/05/20  3:05 PM  Result Value Ref Range   Total Iron Binding Capacity 438 250 - 450 ug/dL   UIBC 406 (H) 118 - 369 ug/dL   Iron 32 27 - 139 ug/dL   Iron Saturation 7 (LL) 15 - 55 %   Ferritin 18 15 - 150 ng/mL    RADIOGRAPHIC STUDIES: I have personally reviewed the radiological images as listed and agreed with the findings in the report. 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 EXAM: MRA HEAD WITHOUT CONTRAST TECHNIQUE: Angiographic images of the Circle of Willis were obtained using MRA technique without intravenous contrast. COMPARISON:  03/21/2020 FINDINGS: The visualized distal vertebral arteries are patent to the basilar with the right being strongly dominant. Signal loss in the  distal V3 and proximal V4 segments on the left is favored to be technical and related to vessel orientation and small vessel size. The left PICA and right AICA appear dominant. Patent SCAs are seen bilaterally. The basilar artery is patent with unchanged mild narrowing proximally. Posterior communicating arteries are not identified and may be diminutive or absent. Both PCAs are patent with chronic irregularity which is more prominent on today's study with distal branch vessel attenuation with apparent changes potentially being technical as there is greater noise on today's study. No definite flow limiting  proximal PCA stenosis is evident. The internal carotid arteries are patent from the included distal cervical segments through the termini. The distal right cervical ICA is tortuous. There is asymmetric signal loss in the right ICA at the level of the anterior genu which is at least in part technical. A stenosis is not excluded in this region, however no significant stenosis was present on the prior study. ACAs and MCAs are patent without evidence of a proximal branch occlusion. There is mild multifocal narrowing of both M1 segments which likely reflects a combination of atherosclerosis and artifact with the prior study demonstrating mild narrowing at the level of the right MCA bifurcation. There is also mild irregularity and attenuation of bilateral MCA branch vessels. Previous coiling of an anterior communicating aneurysm is again noted with mild susceptibility artifact. An approximately 4 x 1.5 mm region of residual flow within the right aspect of the aneurysm has not significantly changed (series 2, image 152). No new aneurysm is identified. IMPRESSION: 1. 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 2. Intracranial atherosclerosis with differences between the current and prior MRA felt to primarily be due to technical factors. Electronically Signed   By: Logan Bores M.D.   On: 10/24/2020 09:49    ASSESSMENT   Iron deficiency anemia: -Initially diagnosed in 2019 when she was hospitalized for GI bleed with hemoglobin of 7.2. -She received 2 units of packed red blood cells with improvement of her hemoglobin. -Work-up included colonoscopy and EGD which revealed and 8 polyps that were removed. -Had ultrasound of her abdomen on 07/02/2020 which showed underlying hepatic cirrhosis/steatosis.  Proximal common bile duct dilatation without obstructing lesion. -She is unable to tolerate oral iron secondary to constipation. -Hemoglobin remained relatively stable from  2019 until now.  -Most recent labs from her PCPs office showed a hemoglobin of 13, MCV 76, ferritin of 18 and iron saturation 7%.  Social/family history: -Has smoked intermittently since she was 79 years old approximately 1 pack/day. -She quit smoking 3 days ago. -Has family history for pancreatic and breast cancer.  No personal history of cancer.  CVA (2013): -Currently on Plavix and aspirin.  Primary biliary cholangitis: -Followed by Dr. Bernadene Person (GI). -Presented with elevated LFTs. -Work-up revealed elevated antimitochondrial antibody. -Elastography revealed F2/F3 disease. -She was started on Urso which improved her LFTs. -Most recent labs from 07/02/2020 show an AST of 31 and ALT of 24. -Ultrasound of her abdomen on 06/26/20 showed common bile duct dilatation without obstruction.  Liver showed hepatic cyst steatosis and cirrhosis.  Stable.   PLAN:  Iron deficiency anemia: -Likely secondary to slow GI bleed given stable hemoglobin. -Unable to tolerate oral iron. -Denies any bleeding. -Will collect additional labs to rule out other nutritional deficiencies including vitamin B12, MMA, folate and copper. -Given her age, will add on SPEP. -Based on lab work from PCP we will go ahead and get her set up for  IV iron.  Will call with additional lab results.  Tobacco abuse: -Recently quit smoking -Patient qualifies for low-dose CT screening program. -Patient is agreeable and we will get her set up for scan and shared decision-making visit today.  Disposition: -Labs today. -IV iron (Feraheme or Venofer based on insurance preference) -RTC in 6-8 weeks for repeat labs (CBC, ferritin and iron) MD assessment.  No problem-specific Assessment & Plan notes found for this encounter.   All questions were answered. The patient knows to call the clinic with any problems, questions or concerns.  I have independently interviewed and examined this patient and agree with H&P written by my nurse  practitioner Faythe Casa, NP.  In brief, severe iron deficiency state with tiredness and inability to tolerate oral iron therapy secondary to constipation.  Iron deficiency state is secondary to blood loss.  She will benefit from parenteral iron therapy with Feraheme/Venofer.  We will rule out other coexisting nutritional deficiencies.  We will plan to repeat her labs in 6 to 8 weeks.    Derek Jack, MD 11/13/20 6:49 PM

## 2020-11-13 ENCOUNTER — Inpatient Hospital Stay (HOSPITAL_COMMUNITY): Payer: HMO | Attending: Hematology | Admitting: Hematology

## 2020-11-13 ENCOUNTER — Other Ambulatory Visit: Payer: Self-pay | Admitting: Oncology

## 2020-11-13 ENCOUNTER — Telehealth (HOSPITAL_COMMUNITY): Payer: Self-pay

## 2020-11-13 ENCOUNTER — Encounter (HOSPITAL_COMMUNITY): Payer: Self-pay | Admitting: Hematology

## 2020-11-13 ENCOUNTER — Inpatient Hospital Stay (HOSPITAL_COMMUNITY): Payer: HMO

## 2020-11-13 ENCOUNTER — Other Ambulatory Visit: Payer: Self-pay

## 2020-11-13 VITALS — BP 151/72 | HR 84 | Temp 97.0°F | Resp 18 | Ht 62.0 in | Wt 151.8 lb

## 2020-11-13 DIAGNOSIS — Z7982 Long term (current) use of aspirin: Secondary | ICD-10-CM | POA: Insufficient documentation

## 2020-11-13 DIAGNOSIS — Z90710 Acquired absence of both cervix and uterus: Secondary | ICD-10-CM | POA: Insufficient documentation

## 2020-11-13 DIAGNOSIS — K743 Primary biliary cirrhosis: Secondary | ICD-10-CM | POA: Diagnosis not present

## 2020-11-13 DIAGNOSIS — Z801 Family history of malignant neoplasm of trachea, bronchus and lung: Secondary | ICD-10-CM

## 2020-11-13 DIAGNOSIS — D509 Iron deficiency anemia, unspecified: Secondary | ICD-10-CM | POA: Insufficient documentation

## 2020-11-13 DIAGNOSIS — D649 Anemia, unspecified: Secondary | ICD-10-CM

## 2020-11-13 DIAGNOSIS — Z7902 Long term (current) use of antithrombotics/antiplatelets: Secondary | ICD-10-CM | POA: Diagnosis not present

## 2020-11-13 DIAGNOSIS — Z87891 Personal history of nicotine dependence: Secondary | ICD-10-CM | POA: Diagnosis not present

## 2020-11-13 DIAGNOSIS — Z808 Family history of malignant neoplasm of other organs or systems: Secondary | ICD-10-CM | POA: Diagnosis not present

## 2020-11-13 DIAGNOSIS — Z800 Family history of malignant neoplasm of digestive organs: Secondary | ICD-10-CM

## 2020-11-13 DIAGNOSIS — Z8673 Personal history of transient ischemic attack (TIA), and cerebral infarction without residual deficits: Secondary | ICD-10-CM

## 2020-11-13 NOTE — Telephone Encounter (Signed)
This nurse called patient to inform her of lab appointment.  Patient acknowledged understanding.  No further questions or concerns at this time.

## 2020-11-14 ENCOUNTER — Inpatient Hospital Stay (HOSPITAL_COMMUNITY): Payer: HMO

## 2020-11-14 ENCOUNTER — Other Ambulatory Visit: Payer: Self-pay

## 2020-11-14 DIAGNOSIS — D649 Anemia, unspecified: Secondary | ICD-10-CM

## 2020-11-14 DIAGNOSIS — D509 Iron deficiency anemia, unspecified: Secondary | ICD-10-CM | POA: Diagnosis not present

## 2020-11-14 LAB — FOLATE: Folate: 28.2 ng/mL (ref >5.9)

## 2020-11-14 LAB — VITAMIN B12: Vitamin B-12: 642 pg/mL (ref 180–914)

## 2020-11-17 LAB — MULTIPLE MYELOMA PANEL, SERUM
Albumin SerPl Elph-Mcnc: 3.9 g/dL (ref 2.9–4.4)
Albumin/Glob SerPl: 1.4 (ref 0.7–1.7)
Alpha 1: 0.2 g/dL (ref 0.0–0.4)
Alpha2 Glob SerPl Elph-Mcnc: 0.8 g/dL (ref 0.4–1.0)
B-Globulin SerPl Elph-Mcnc: 1 g/dL (ref 0.7–1.3)
Gamma Glob SerPl Elph-Mcnc: 1 g/dL (ref 0.4–1.8)
Globulin, Total: 3 g/dL (ref 2.2–3.9)
IgA: 134 mg/dL (ref 64–422)
IgG (Immunoglobin G), Serum: 981 mg/dL (ref 586–1602)
IgM (Immunoglobulin M), Srm: 186 mg/dL (ref 26–217)
Total Protein ELP: 6.9 g/dL (ref 6.0–8.5)

## 2020-11-17 LAB — COPPER, SERUM: Copper: 104 ug/dL (ref 80–158)

## 2020-11-19 ENCOUNTER — Inpatient Hospital Stay (HOSPITAL_COMMUNITY): Payer: HMO

## 2020-11-19 ENCOUNTER — Other Ambulatory Visit: Payer: Self-pay

## 2020-11-19 ENCOUNTER — Encounter (HOSPITAL_COMMUNITY): Payer: Self-pay

## 2020-11-19 VITALS — BP 108/48 | HR 68 | Temp 97.0°F | Resp 18

## 2020-11-19 DIAGNOSIS — D509 Iron deficiency anemia, unspecified: Secondary | ICD-10-CM

## 2020-11-19 LAB — METHYLMALONIC ACID, SERUM: Methylmalonic Acid, Quantitative: 120 nmol/L (ref 0–378)

## 2020-11-19 MED ORDER — LORATADINE 10 MG PO TABS
ORAL_TABLET | ORAL | Status: AC
  Filled 2020-11-19: qty 1

## 2020-11-19 MED ORDER — FAMOTIDINE 20 MG PO TABS
ORAL_TABLET | ORAL | Status: AC
  Filled 2020-11-19: qty 1

## 2020-11-19 MED ORDER — SODIUM CHLORIDE 0.9 % IV SOLN
510.0000 mg | Freq: Once | INTRAVENOUS | Status: AC
  Administered 2020-11-19: 510 mg via INTRAVENOUS
  Filled 2020-11-19: qty 17

## 2020-11-19 MED ORDER — LORATADINE 10 MG PO TABS
10.0000 mg | ORAL_TABLET | Freq: Once | ORAL | Status: AC
  Administered 2020-11-19: 10 mg via ORAL

## 2020-11-19 MED ORDER — SODIUM CHLORIDE 0.9 % IV SOLN: Freq: Once | INTRAVENOUS | Status: AC

## 2020-11-19 MED ORDER — FAMOTIDINE 20 MG PO TABS
20.0000 mg | ORAL_TABLET | Freq: Once | ORAL | Status: AC
  Administered 2020-11-19: 20 mg via ORAL

## 2020-11-19 MED ORDER — ACETAMINOPHEN 325 MG PO TABS
ORAL_TABLET | ORAL | Status: AC
  Filled 2020-11-19: qty 2

## 2020-11-19 NOTE — Progress Notes (Signed)
Pt does not take tylenol as part of her pre-medications due to her cirrhosis, they have been removed.   Tolerated iron infusion well today without incidence.  Vital signs stable prior to discharge.  Discharged in stable condition ambulatory.

## 2020-11-19 NOTE — Patient Instructions (Signed)
[facility] at [facility]  Discharge Instructions:  You had an iron infusion today.  Return as scheduled.  _______________________________________________________________  Thank you for choosing [place] at [facility] to provide your oncology and hematology care.  To afford each patient quality time with our providers, please arrive at least 15 minutes before your scheduled appointment.  You need to re-schedule your appointment if you arrive 10 or more minutes late.  We strive to give you quality time with our providers, and arriving late affects you and other patients whose appointments are after yours.  Also, if you no show three or more times for appointments you may be dismissed from the clinic.  Again, thank you for choosing [place] [facility] [place] hope is that these requests will allow you access to exceptional care and in a timely manner. _______________________________________________________________  If you have questions after your visit, please contact our office at [phone] between the hours of 8:30 a.m. and 5:00 p.m. Voicemails left after 4:30 p.m. will not be returned until the following business day. _______________________________________________________________  For prescription refill requests, have your pharmacy contact our office. _______________________________________________________________  Recommendations made by the consultant and any test results will be sent to your referring physician. _______________________________________________________________

## 2020-11-22 ENCOUNTER — Inpatient Hospital Stay (HOSPITAL_BASED_OUTPATIENT_CLINIC_OR_DEPARTMENT_OTHER): Payer: HMO | Admitting: Oncology

## 2020-11-22 ENCOUNTER — Other Ambulatory Visit: Payer: Self-pay

## 2020-11-22 DIAGNOSIS — Z87891 Personal history of nicotine dependence: Secondary | ICD-10-CM | POA: Diagnosis not present

## 2020-11-22 NOTE — Progress Notes (Signed)
Virtual Visit via Video Note  I connected with Elizabeth [name] on 11/22/20 at  9:30 AM EDT by a video enabled telemedicine application and verified that I am speaking with the correct person using two identifiers.  Location: Patient: Home Provider: Clinic    I discussed the limitations of evaluation and management by telemedicine and the availability of in person appointments. The patient expressed understanding and agreed to proceed.  I discussed the assessment and treatment plan with the patient. The patient was provided an opportunity to ask questions and all were answered. The patient agreed with the plan and demonstrated an understanding of the instructions.   The patient was advised to call back or seek an in-person evaluation if the symptoms worsen or if the condition fails to improve as anticipated.   In accordance with CMS guidelines, patient has met eligibility criteria including age, absence of signs or symptoms of lung cancer.  Social History   Tobacco Use  . Smoking status: Former Smoker    Packs/day: 1.00    Years: 20.00    Pack years: 20.00    Types: Cigarettes    Start date: 01/15/1983    Quit date: 11/09/2020    Years since quitting: 0.0  . Smokeless tobacco: Never Used  Vaping Use  . Vaping Use: Never used  Substance Use Topics  . Alcohol use: No    Alcohol/week: 0.0 standard drinks  . Drug use: No      A shared decision-making session was conducted prior to the performance of CT scan. This includes one or more decision aids, includes benefits and harms of screening, follow-up diagnostic testing, over-diagnosis, false positive rate, and total radiation exposure.   Counseling on the importance of adherence to annual lung cancer LDCT screening, impact of co-morbidities, and ability or willingness to undergo diagnosis and treatment is imperative for compliance of the program.   Counseling on the importance of continued smoking cessation for former smokers; the importance  of smoking cessation for current smokers, and information about tobacco cessation interventions have been given to patient including [place] and [facility] [place] programs.   Written order for lung cancer screening with LDCT has been given to the patient and any and all questions have been answered to the best of my abilities.    Yearly follow up will be coordinated by Burgess Estelle, Thoracic Navigator.  I provided 15 minutes of non face-to-face telephone visit time during this encounter, and > 50% was spent counseling as documented under my assessment & plan.   Jacquelin Hawking, NP

## 2020-11-23 ENCOUNTER — Ambulatory Visit (HOSPITAL_COMMUNITY): Payer: HMO

## 2020-11-26 ENCOUNTER — Other Ambulatory Visit: Payer: Self-pay

## 2020-11-26 ENCOUNTER — Inpatient Hospital Stay (HOSPITAL_COMMUNITY): Payer: HMO

## 2020-11-26 VITALS — BP 127/60 | HR 78 | Temp 97.0°F | Resp 18

## 2020-11-26 DIAGNOSIS — D509 Iron deficiency anemia, unspecified: Secondary | ICD-10-CM

## 2020-11-26 MED ORDER — SODIUM CHLORIDE 0.9 % IV SOLN: Freq: Once | INTRAVENOUS | Status: AC

## 2020-11-26 MED ORDER — FAMOTIDINE 20 MG PO TABS
20.0000 mg | ORAL_TABLET | Freq: Once | ORAL | Status: AC
  Administered 2020-11-26: 20 mg via ORAL

## 2020-11-26 MED ORDER — SODIUM CHLORIDE 0.9 % IV SOLN
510.0000 mg | Freq: Once | INTRAVENOUS | Status: AC
  Administered 2020-11-26: 510 mg via INTRAVENOUS
  Filled 2020-11-26: qty 510

## 2020-11-26 MED ORDER — LORATADINE 10 MG PO TABS
10.0000 mg | ORAL_TABLET | Freq: Once | ORAL | Status: AC
Start: 2020-11-26 — End: 2020-11-26
  Administered 2020-11-26: 10 mg via ORAL

## 2020-11-26 NOTE — Progress Notes (Signed)
Elizabeth [name] presents today for IV feraheme infusion per MDs orders in supportive therapy plan. Vitals have been reviewed and are stable and within parameters for infusion today. Infusion given without incident or complaint. Patient observed for 30 minutes post infusion per orders. VSS upon completion of infusion, IV flushed and removed per protocol, see MAR and IV flowsheet for details. Discharged in satisfactory condition with follow up instructions.

## 2020-11-26 NOTE — Patient Instructions (Signed)
Ferumoxytol injection What is this medicine? FERUMOXYTOL is an iron complex. Iron is used to make healthy red blood cells, which carry oxygen and nutrients throughout the body. This medicine is used to treat iron deficiency anemia. This medicine may be used for other purposes; ask your health care provider or pharmacist if you have questions. COMMON BRAND NAME(S): Feraheme What should I tell my health care provider before I take this medicine? They need to know if you have any of these conditions:  anemia not caused by low iron levels  high levels of iron in the blood  magnetic resonance imaging (MRI) test scheduled  an unusual or allergic reaction to iron, other medicines, foods, dyes, or preservatives  pregnant or trying to get pregnant  breast-feeding How should I use this medicine? This medicine is for injection into a vein. It is given by a health care professional in a hospital or clinic setting. Talk to your pediatrician regarding the use of this medicine in children. Special care may be needed. Overdosage: If you think you have taken too much of this medicine contact a poison control center or emergency room at once. NOTE: This medicine is only for you. Do not share this medicine with others. What if I miss a dose? It is important not to miss your dose. Call your doctor or health care professional if you are unable to keep an appointment. What may interact with this medicine? This medicine may interact with the following medications:  other iron products This list may not describe all possible interactions. Give your health care provider a list of all the medicines, herbs, non-prescription drugs, or dietary supplements you use. Also tell them if you smoke, drink alcohol, or use illegal drugs. Some items may interact with your medicine. What should I watch for while using this medicine? Visit your doctor or healthcare professional regularly. Tell your doctor or healthcare  professional if your symptoms do not start to get better or if they get worse. You may need blood work done while you are taking this medicine. You may need to follow a special diet. Talk to your doctor. Foods that contain iron include: whole grains/cereals, dried fruits, beans, or peas, leafy green vegetables, and organ meats (liver, kidney). What side effects may I notice from receiving this medicine? Side effects that you should report to your doctor or health care professional as soon as possible:  allergic reactions like skin rash, itching or hives, swelling of the face, lips, or tongue  breathing problems  changes in blood pressure  feeling faint or lightheaded, falls  fever or chills  flushing, sweating, or hot feelings  swelling of the ankles or feet Side effects that usually do not require medical attention (report to your doctor or health care professional if they continue or are bothersome):  diarrhea  headache  nausea, vomiting  stomach pain This list may not describe all possible side effects. Call your doctor for medical advice about side effects. You may report side effects to FDA at 1-800-FDA-1088. Where should I keep my medicine? This drug is given in a hospital or clinic and will not be stored at home. NOTE: This sheet is a summary. It may not cover all possible information. If you have questions about this medicine, talk to your doctor, pharmacist, or health care provider.  2021 Elsevier/Gold Standard (2016-10-13 20:21:10)  

## 2020-12-04 ENCOUNTER — Encounter (INDEPENDENT_AMBULATORY_CARE_PROVIDER_SITE_OTHER): Payer: Self-pay | Admitting: *Deleted

## 2020-12-17 ENCOUNTER — Other Ambulatory Visit (INDEPENDENT_AMBULATORY_CARE_PROVIDER_SITE_OTHER): Payer: Self-pay

## 2020-12-17 DIAGNOSIS — K743 Primary biliary cirrhosis: Secondary | ICD-10-CM

## 2020-12-18 ENCOUNTER — Ambulatory Visit (HOSPITAL_COMMUNITY)
Admission: RE | Admit: 2020-12-18 | Discharge: 2020-12-18 | Disposition: A | Discharge status: Home / Self Care | Payer: HMO | Source: Ambulatory Visit | Attending: Oncology | Admitting: Oncology

## 2020-12-18 DIAGNOSIS — D649 Anemia, unspecified: Secondary | ICD-10-CM | POA: Diagnosis not present

## 2020-12-18 DIAGNOSIS — J439 Emphysema, unspecified: Secondary | ICD-10-CM | POA: Insufficient documentation

## 2020-12-18 DIAGNOSIS — I2510 Atherosclerotic heart disease of native coronary artery without angina pectoris: Secondary | ICD-10-CM | POA: Diagnosis not present

## 2020-12-18 DIAGNOSIS — K7460 Unspecified cirrhosis of liver: Secondary | ICD-10-CM | POA: Insufficient documentation

## 2020-12-18 DIAGNOSIS — I700 Atherosclerosis of aorta: Secondary | ICD-10-CM | POA: Diagnosis not present

## 2020-12-18 DIAGNOSIS — Z87891 Personal history of nicotine dependence: Secondary | ICD-10-CM | POA: Insufficient documentation

## 2020-12-18 DIAGNOSIS — Z122 Encounter for screening for malignant neoplasm of respiratory organs: Secondary | ICD-10-CM | POA: Diagnosis present

## 2020-12-20 ENCOUNTER — Encounter (HOSPITAL_COMMUNITY): Payer: Self-pay

## 2020-12-20 NOTE — Progress Notes (Signed)
Patient notified of LDCT Lung Cancer Screening Results via mail with the recommendation to follow-up in 12 months. Patient's referring provider has been sent a copy of results. Results are as follows:  IMPRESSION: 1. Lung-RADS 2, benign appearance or behavior. Continue annual screening with low-dose chest CT without contrast in 12 months. 2. Cirrhosis. 3. Aortic atherosclerosis (ICD10-I70.0). Coronary artery calcification. 4.  Emphysema (ICD10-J43.9).

## 2020-12-25 ENCOUNTER — Encounter (INDEPENDENT_AMBULATORY_CARE_PROVIDER_SITE_OTHER): Payer: Self-pay | Admitting: Internal Medicine

## 2020-12-25 ENCOUNTER — Other Ambulatory Visit: Payer: Self-pay

## 2020-12-25 ENCOUNTER — Ambulatory Visit (INDEPENDENT_AMBULATORY_CARE_PROVIDER_SITE_OTHER): Payer: HMO | Admitting: Internal Medicine

## 2020-12-25 VITALS — BP 176/68 | HR 91 | Temp 97.9°F | Ht 62.0 in | Wt 152.1 lb

## 2020-12-25 DIAGNOSIS — K219 Gastro-esophageal reflux disease without esophagitis: Secondary | ICD-10-CM

## 2020-12-25 DIAGNOSIS — Z862 Personal history of diseases of the blood and blood-forming organs and certain disorders involving the immune mechanism: Secondary | ICD-10-CM | POA: Diagnosis not present

## 2020-12-25 DIAGNOSIS — K743 Primary biliary cirrhosis: Secondary | ICD-10-CM

## 2020-12-25 DIAGNOSIS — K7400 Hepatic fibrosis, unspecified: Secondary | ICD-10-CM

## 2020-12-25 NOTE — Patient Instructions (Signed)
Physician will call with results of ultrasound when completed. Notify if you have rectal bleeding or tarry stool

## 2020-12-25 NOTE — Progress Notes (Signed)
Presenting complaint;  Follow for chronic liver disease GERD and history of iron deficiency anemia.  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  He has chronic GERD and maintained on PPI.  She was evaluated for chronically elevated transaminases about 2 years ago.  Work-up was negative except antimitochondrial antibody titer was 114.  She was begun on Urso with normalization of her transaminases.  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  She was last seen 1 year ago. She states she is doing well.  She says pantoprazole is working well.  She is not having any side effects.  She denies nausea vomiting dysphagia hoarseness or chronic cough.  She says her appetite is good she eats 3 meals a day but her meals are not big.  She has lost 9 pounds since her last visit.  She says she is happy that she is losing weight although she is not trying to.  She feels it is helping control her blood glucose levels.  She takes cinnamon every other day. She states she received iron infusion on 11/18/2020 and a week later. She denies abdominal pain or pruritus.  She says she is active.  She walks every day.  Current Medications: Outpatient Encounter Medications as of 12/25/2020  Medication Sig  . albuterol (VENTOLIN HFA) 108 (90 Base) MCG/ACT inhaler INHALE 2 PUFFS EVERY SIX HOURS AS NEEDED FOR WHEEZING OR SHORTNESS OF BREATH  . amLODipine (NORVASC) 10 MG tablet Take 1 tablet (10 mg total) by mouth daily.  Marland Kitchen aspirin 325 MG tablet Take 325 mg by mouth daily.  . Carboxymethylcellulose Sodium 1 % GEL Place 1 drop into both eyes 2 (two) times daily.  . cetirizine (ZYRTEC) 10 MG tablet Take 10 mg by mouth daily.  Marland Kitchen CINNAMON PO Take 1,000 mg by mouth daily as  needed (for blood sugars).   . clopidogrel (PLAVIX) 75 MG tablet TAKE ONE TABLET BY MOUTH DAILY (PLEASE USE AUROBINDO BRAND, PT HD FOR ALLERGY TO REDDY). Hold for 5 days  . JARDIANCE 10 MG TABS tablet TAKE 1 TABLET BY MOUTH ONCE DAILY (TO replace farxiga)  . pantoprazole (PROTONIX) 40 MG tablet TAKE 1 TABLET BY MOUTH DAILY BEFORE BREAKFAST  . ursodiol (ACTIGALL) 300 MG capsule TAKE ONE CAPSULE BY MOUTH THREE TIMES DAILY  . gabapentin (NEURONTIN) 100 MG capsule TAKE ONE CAPSULE BY MOUTH AT BEDTIME (Patient not taking: Reported on 12/25/2020)  . hydrochlorothiazide (MICROZIDE) 12.5 MG capsule TAKE ONE CAPSULE BY MOUTH EVERY DAY (Patient not taking: Reported on 12/25/2020)  . [DISCONTINUED] ramipril (ALTACE) 10 MG capsule Take 10 mg by mouth daily.     No facility-administered encounter medications on file as of 12/25/2020.     Objective: Blood pressure (!) 176/68, pulse 91, temperature 97.9 F (36.6 C), temperature source Oral, height '5\' 2"'  (1.575 m), weight 152 lb 1.6 oz (69 kg). Patient is alert and in no acute distress. She is wearing a mask. She does not have asterixis. Conjunctiva is pink. Sclera is nonicteric Oropharyngeal mucosa is normal. No neck masses or thyromegaly noted. Cardiac exam with regular rhythm normal S1 and S2.  She has faint systolic murmur at aortic area. Lungs are clear to auscultation. Abdomen is full.  On palpation is soft.  Spleen is nonpalpable.  Left  lobe of the liver is prominent and firm.  No shifting dullness noted. No LE edema or clubbing noted.  Labs/studies Results:  CBC Latest Ref Rng & Units 11/05/2020 10/03/2020 12/26/2019  WBC 3.4 - 10.8 x10E3/uL 5.3 6.0 6.0  Hemoglobin 11.1 - 15.9 g/dL 13.0 10.3(L) 13.4  Hematocrit 34.0 - 46.6 % 41.3 33.8(L) 42.0  Platelets 150 - 450 x10E3/uL 252 331 263    CMP Latest Ref Rng & Units 11/05/2020 10/03/2020 07/02/2020  Glucose 65 - 99 mg/dL 125(H) 119(H) -  BUN 8 - 27 mg/dL 12 12 -  Creatinine 0.57 - 1.00 mg/dL 0.81  0.79 -  Sodium 134 - 144 mmol/L 137 136 -  Potassium 3.5 - 5.2 mmol/L 4.2 4.2 -  Chloride 96 - 106 mmol/L 101 99 -  CO2 20 - 29 mmol/L 22 22 -  Calcium 8.7 - 10.3 mg/dL 10.0 10.1 -  Total Protein 6.0 - 8.5 g/dL 7.2 7.1 7.2  Total Bilirubin 0.0 - 1.2 mg/dL 0.4 0.5 0.6  Alkaline Phos 44 - 121 IU/L 146(H) 138(H) 103  AST 0 - 40 IU/L '27 28 30  ' ALT 0 - 32 IU/L '23 22 27    ' Hepatic Function Latest Ref Rng & Units 11/05/2020 10/03/2020 07/02/2020  Total Protein 6.0 - 8.5 g/dL 7.2 7.1 7.2  Albumin 3.7 - 4.7 g/dL 4.4 4.4 3.7  AST 0 - 40 IU/L '27 28 30  ' ALT 0 - 32 IU/L '23 22 27  ' Alk Phosphatase 44 - 121 IU/L 146(H) 138(H) 103  Total Bilirubin 0.0 - 1.2 mg/dL 0.4 0.5 0.6  Bilirubin, Direct 0.0 - 0.2 mg/dL - - <0.1      Assessment:  #1.  Primary biliary cirrhosis.  Diagnosis based on high titer of antimitochondrial antibody back in May 2020.  It was decided not to proceed with liver biopsy for confirmation.  Her transaminases have normalized since she has been on ursodeoxycholic acid.  Alkaline phosphatase remains mildly elevated she remains with well-preserved hepatic function.  Clinically she does not have ascites or splenomegaly. She has received hepatitis a and B vaccination in the past  #2.  History of iron deficiency anemia.  IDA was felt to be due to chronic blood GI blood loss.  EGD was unremarkable and she had multiple adenomas removed from her colon but no bleeding lesion was identified.  Small bowel given capsule study however revealed fresh blood and clot in her small bowel without any obvious lesion.  Therefore angiodysplasia or mucosal injury was suspected to be the source in the setting of aspirin and clopidogrel.  She has not had any evidence of overt GI bleed.  She is receiving iron infusion by oncology clinic and her H&H remained normal.  She remains on dual antiplatelet therapy.  #3.  Chronic GERD.  EGD back in December 2019 revealed small sliding hiatal hernia.  Symptoms are well  controlled with PPI.  I feel PPI should be continued as she is also at increased risk for peptic ulcer disease as she is on full dose aspirin.  #4.  History of multiple colonic adenomas.  She was supposed to have follow-up examination but got postponed because of COVID.  Will discuss at the time of next office visit.  Plan:  Continue pantoprazole at 40 mg p.o. every morning.. Abdominal ultrasound in a.m. as planned. Office visit in 6 months.

## 2020-12-26 ENCOUNTER — Ambulatory Visit (HOSPITAL_COMMUNITY)
Admission: RE | Admit: 2020-12-26 | Discharge: 2020-12-26 | Disposition: A | Discharge status: Home / Self Care | Payer: HMO | Source: Ambulatory Visit | Attending: Internal Medicine | Admitting: Internal Medicine

## 2020-12-26 DIAGNOSIS — K743 Primary biliary cirrhosis: Secondary | ICD-10-CM | POA: Diagnosis present

## 2021-01-15 ENCOUNTER — Other Ambulatory Visit (INDEPENDENT_AMBULATORY_CARE_PROVIDER_SITE_OTHER): Payer: Self-pay

## 2021-01-15 DIAGNOSIS — K743 Primary biliary cirrhosis: Secondary | ICD-10-CM

## 2021-01-15 MED ORDER — URSODIOL 300 MG PO CAPS: 300.0000 mg | ORAL_CAPSULE | Freq: Three times a day (TID) | ORAL | 3 refills | Status: DC

## 2021-01-25 NOTE — Progress Notes (Signed)
Subjective:   Elizabeth [name] is a 80 y.o. female who presents for Medicare Annual (Subsequent) preventive examination.  I connected with Elizabeth [name] today by telephone and verified that I am speaking with the correct person using two identifiers. Location patient: home Location provider: work Persons participating in the virtual visit: patient, provider.   I discussed the limitations, risks, security and privacy concerns of performing an evaluation and management service by telephone and the availability of in person appointments. I also discussed with the patient that there may be a patient responsible charge related to this service. The patient expressed understanding and verbally consented to this telephonic visit.    Interactive audio and video telecommunications were attempted between this provider and patient, however failed, due to patient having technical difficulties OR patient did not have access to video capability.  We continued and completed visit with audio only.      Review of Systems    N/A  Cardiac Risk Factors include: advanced age (>52men, >60 women);hypertension;dyslipidemia;diabetes mellitus     Objective:    Today's Vitals   01/28/21 1540  PainSc: 2    There is no height or weight on file to calculate BMI.  Advanced Directives 01/28/2021 11/26/2020 11/19/2020 11/13/2020 02/03/2019 08/17/2018 08/17/2018  Does Patient Have a Medical Advance Directive? Yes Yes Yes Yes Yes Yes -  Type of Paramedic of [place];Living will [place];Living will [place];Living will Living will;Healthcare Power of [place];Living will Healthcare Power of [place]  Does patient want to make changes to medical advance directive? No - Patient declined - No - Patient declined No - Patient declined - No - Patient declined -  Copy of [place] in Chart? No -  copy requested No - copy requested No - copy requested No - copy requested - - No - copy requested  Would patient like information on creating a medical advance directive? - - - - No - Patient declined - -  Pre-existing out of facility DNR order (yellow form or pink MOST form) - - - - - - -    Current Medications (verified) Outpatient Encounter Medications as of 01/28/2021  Medication Sig  . albuterol (VENTOLIN HFA) 108 (90 Base) MCG/ACT inhaler INHALE 2 PUFFS EVERY SIX HOURS AS NEEDED FOR WHEEZING OR SHORTNESS OF BREATH  . amLODipine (NORVASC) 10 MG tablet Take 1 tablet (10 mg total) by mouth daily.  Marland Kitchen aspirin 325 MG tablet Take 325 mg by mouth daily.  . Carboxymethylcellulose Sodium 1 % GEL Place 1 drop into both eyes 2 (two) times daily.  . cetirizine (ZYRTEC) 10 MG tablet Take 10 mg by mouth daily.  Marland Kitchen CINNAMON PO Take 1,000 mg by mouth daily as needed (for blood sugars).   . clopidogrel (PLAVIX) 75 MG tablet TAKE ONE TABLET BY MOUTH DAILY (PLEASE USE AUROBINDO BRAND, PT HD FOR ALLERGY TO [place]DY). Hold for 5 days  . JARDIANCE 10 MG TABS tablet TAKE 1 TABLET BY MOUTH ONCE DAILY (TO replace farxiga)  . pantoprazole (PROTONIX) 40 MG tablet TAKE 1 TABLET BY MOUTH DAILY BEFORE BREAKFAST  . ursodiol (ACTIGALL) 300 MG capsule Take 1 capsule (300 mg total) by mouth 3 (three) times daily.  . [DISCONTINUED] ramipril (ALTACE) 10 MG capsule Take 10 mg by mouth daily.     No facility-administered encounter medications on file as of 01/28/2021.    Allergies (verified) Banana, Codeine, Stevia [stevioside],  Actos [pioglitazone], Benicar [olmesartan medoxomil], Chantix [varenicline], Diovan [valsartan], Lipitor [atorvastatin], Spinach, Vicodin [hydrocodone-acetaminophen], Zocor [simvastatin], Ace inhibitors, Celebrex [celecoxib], Latex, Metformin and related, Penicillins, and Sulfonamide derivatives   History: Past Medical History:  Diagnosis Date  . Absolute anemia 07/28/2018  . Active smoker   .  Allergy   . Anxiety    Takes Xanax for anxiety  . Arthritis   . Asthma   . Cataract   . Chronic airway obstruction, not elsewhere classified   . Clotting disorder (HCC)    stoke   . Depression   . Diarrhea   . Diverticulosis of colon (without mention of hemorrhage)   . Esophageal reflux   . Family history of malignant neoplasm of gastrointestinal tract   . GI bleed   . Headache(784.0)    Irregular  . Neuromuscular disorder (HCC)    numbness is left hand and left elbow  . Obesity, unspecified   . Other and unspecified hyperlipidemia   . Personal history of colonic polyps   . Stricture and stenosis of esophagus   . Stroke (HCC)   . Thyroid disease   . Type II or unspecified type diabetes mellitus without mention of complication, not stated as uncontrolled   . Unspecified asthma(493.90)   . Unspecified essential hypertension    Past Surgical History:  Procedure Laterality Date  . APPENDECTOMY    . BACK SURGERY     Spinal surgery  . BRAIN SURGERY     2013 - stoke ( fall in 2010)   . CARDIAC CATHETERIZATION  2006   LAD: 30%, RCA : 20%, normal EF, elevated LVEDP  . CARDIAC CATHETERIZATION    . CEREBRAL ANGIOGRAM  12/20/2015  . COLONOSCOPY N/A 08/09/2018   Procedure: COLONOSCOPY;  Surgeon: Malissa Hippoehman, Najeeb U, MD;  [facility];  Service: Endoscopy;  Laterality: N/A;  1:55  . ESOPHAGEAL DILATION N/A 08/09/2018   Procedure: ESOPHAGEAL DILATION;  Surgeon: Malissa Hippoehman, Najeeb U, MD;  [facility];  Service: Endoscopy;  Laterality: N/A;  . ESOPHAGOGASTRODUODENOSCOPY N/A 08/09/2018   Procedure: ESOPHAGOGASTRODUODENOSCOPY (EGD);  Surgeon: Malissa Hippoehman, Najeeb U, MD;  [facility];  Service: Endoscopy;  Laterality: N/A;  . GIVENS CAPSULE STUDY N/A 08/25/2018   Procedure: GIVENS CAPSULE STUDY;  Surgeon: Malissa Hippoehman, Najeeb U, MD;  [facility];  Service: Endoscopy;  Laterality: N/A;  . IR ANGIO INTRA EXTRACRAN SEL COM CAROTID INNOMINATE BILAT MOD SED  01/14/2018   . IR ANGIO INTRA EXTRACRAN SEL COM CAROTID INNOMINATE BILAT MOD SED  05/11/2018  . IR ANGIO VERTEBRAL SEL SUBCLAVIAN INNOMINATE UNI L MOD SED  01/14/2018  . IR ANGIO VERTEBRAL SEL SUBCLAVIAN INNOMINATE UNI L MOD SED  05/11/2018  . IR ANGIO VERTEBRAL SEL VERTEBRAL UNI R MOD SED  01/14/2018  . IR ANGIO VERTEBRAL SEL VERTEBRAL UNI R MOD SED  05/11/2018  . KNEE ARTHROSCOPY     left  . LUMBAR DISC SURGERY    . POLYPECTOMY  08/09/2018   Procedure: POLYPECTOMY;  Surgeon: Malissa Hippoehman, Najeeb U, MD;  [facility];  Service: Endoscopy;;  ascending colon (CS x 2, HS x5) hepatic flexure (HSx1), recto-sigmoid (HSx1)  . TONSILLECTOMY    . TOTAL ABDOMINAL HYSTERECTOMY     menopause/bleeding   Family History  Problem Relation Age of Onset  . Diabetes Mother   . Heart disease Mother   . Asthma Mother   . Kidney disease Mother   . Drug abuse Mother   . Hypertension Mother   .  Stroke Mother        fall -several bleed on brain 12/16/1988  . Early death Father        trauma  . Colon cancer Other        first cousin, paternal aunt and uncle  . Cancer Paternal Grandmother        gastric  . Diabetes Paternal Grandmother   . Mental illness Paternal Grandmother   . Heart disease Maternal Grandmother   . Thyroid cancer Other        aunt  . Heart disease Sister   . Mental illness Sister   . Heart attack Sister   . Heart disease Brother   . Lung disease Brother   . Cancer Sister        pancreatic  . Diabetes Sister   . Hypertension Sister   . Tuberculosis Maternal Grandfather   . Heart disease Paternal Grandfather    Social History   Socioeconomic History  . Marital status: Married    Spouse name: [name]  . Number of children: 0  . Years of education: 16  . Highest education level: Bachelor's degree (e.g., BA, AB, BS)  Occupational History  . 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 Smoking status: Former Smoker    Packs/day: 1.00    Years: 20.00    Pack  years: 20.00    Types: Cigarettes    Start date: 01/15/1983    Quit date: 11/09/2020    Years since quitting: 0.2  . Smokeless tobacco: Never Used  Vaping Use  . Vaping Use: Never used  Substance and Sexual Activity  . Alcohol use: No    Alcohol/week: 0.0 standard drinks  . Drug use: No  . Sexual activity: Not Currently  Other Topics Concern  . Not on file  Social History Narrative   Married [name]   Limited activity due to back pain   Social Determinants of Health   Financial Resource Strain: Low Risk   . Difficulty of Paying Living Expenses: Not hard at all  Food Insecurity: No Food Insecurity  . Worried About Charity fundraiser in the Last Year: Never true  . Ran Out of Food in the Last Year: Never true  Transportation Needs: No Transportation Needs  . Lack of Transportation (Medical): No  . Lack of Transportation (Non-Medical): No  Physical Activity: Insufficiently Active  . Days of Exercise per Week: 3 days  . Minutes of Exercise per Session: 20 min  Stress: No Stress Concern Present  . Feeling of Stress : Not at all  Social Connections: Moderately Isolated  . Frequency of Communication with Friends and Family: More than three times a week  . Frequency of Social Gatherings with Friends and Family: More than three times a week  . Attends Religious Services: Never  . Active Member of Clubs or Organizations: No  . Attends Archivist Meetings: Never  . Marital Status: Married    Tobacco Counseling Counseling given: Not Answered   Clinical Intake:  Pre-visit preparation completed: Yes  Pain : 0-10 Pain Score: 2  Pain Type: Chronic pain Pain Location: Back (leg) Pain Descriptors / Indicators: Numbness Pain Onset: More than a month ago Pain Frequency: Constant     Nutritional Risks: None Diabetes: Yes CBG done?: No Did pt. bring in CBG monitor from home?: No  How often do you need to have someone help you when you read instructions, pamphlets,  or  other written materials from your doctor or pharmacy?: 1 - Never What is the last grade level you completed in school?: Bachelors Degree  Diabetic?Yes Nutrition Risk Assessment:  Has the patient had any N/V/D within the last 2 months?  No  Does the patient have any non-healing wounds?  No  Has the patient had any unintentional weight loss or weight gain?  No   Diabetes:  Is the patient diabetic?  Yes  If diabetic, was a CBG obtained today?  No  Did the patient bring in their glucometer from home?  No  How often do you monitor your CBG's? Patient states she checks glucose once per day.   Financial Strains and Diabetes Management:  Are you having any financial strains with the device, your supplies or your medication? No .  Does the patient want to be seen by Chronic Care Management for management of their diabetes?  No  Would the patient like to be referred to a Nutritionist or for Diabetic Management?  No   Diabetic Exams:  Diabetic Eye Exam: Overdue for diabetic eye exam. Pt has been advised about the importance in completing this exam. Patient advised to call and schedule an eye exam. Diabetic Foot Exam: Overdue, Pt has been advised about the importance in completing this exam. Pt is scheduled for diabetic foot exam on 02/05/2021.   Interpreter Needed?: No  Information entered by :: [place] [facility] Daily Living In your present state of health, do you have any difficulty performing the following activities: 01/28/2021  Hearing? Y  Vision? N  Walking or climbing stairs? N  Dressing or bathing? N  Doing errands, shopping? N  Preparing Food and eating ? N  Using the Toilet? N  In the past six months, have you accidently leaked urine? Y  Do you have problems with loss of bowel control? N  Managing your Medications? N  Managing your Finances? N  Housekeeping or managing your Housekeeping? N  Some recent data might be hidden    Patient Care Team: Noreene Larsson, NP as PCP - General (Nurse Practitioner) Particia Nearing, [place] (Optometry) Rogene Houston, MD as Consulting Physician (Gastroenterology) Luanne Bras, MD as Consulting Physician (Interventional Radiology)  Indicate any recent Medical Services you may have received from other than [facility] providers in the past year (date may be approximate).     Assessment:   This is a routine wellness examination for [facility].  Hearing/Vision screen  Hearing Screening   125Hz  250Hz  500Hz  1000Hz  2000Hz  3000Hz  4000Hz  6000Hz  8000Hz   Right ear:           Left ear:           Vision Screening Comments: Patient states gets eye exams annually. Currently wearing glasses  Dietary issues and exercise activities discussed: Current Exercise Habits: Home exercise routine, Type of exercise: walking, Time (Minutes): 20, Frequency (Times/Week): 3, Weekly Exercise (Minutes/Week): 60, Intensity: Mild, Exercise limited by: None identified  Goals Addressed            This Visit's Progress   . HEMOGLOBIN A1C < 7      . Quit smoking / using tobacco   On track   . stop smoking   On track     Depression Screen PHQ 2/9 Scores 01/28/2021 11/13/2020 11/05/2020 10/08/2020 10/03/2020 09/11/2020 10/03/2019  PHQ - 2 Score 1 0 1 0 0 0 0  PHQ- 9 Score 4 - - - - - 0  Fall Risk Fall Risk  01/28/2021 11/05/2020 10/08/2020 10/03/2020 09/11/2020  Falls in the past year? 1 0 0 0 0  Number falls in past yr: 0 0 0 0 0  Injury with Fall? 0 0 0 0 0  Comment - - - - -  Risk for fall due to : No Fall Risks No Fall Risks No Fall Risks No Fall Risks No Fall Risks  Follow up Falls evaluation completed;Falls prevention discussed Falls evaluation completed Falls evaluation completed Falls evaluation completed Falls evaluation completed    FALL RISK PREVENTION PERTAINING TO THE HOME:  Any stairs in or around the home? Yes  If so, are there any without handrails? No  Home free of loose throw rugs in walkways, pet beds, electrical cords, etc? Yes   Adequate lighting in your home to reduce risk of falls? Yes   ASSISTIVE DEVICES UTILIZED TO PREVENT FALLS:  Life alert? No  Use of a cane, walker or w/c? No  Grab bars in the bathroom? No  Shower chair or bench in shower? Yes  Elevated toilet seat or a handicapped toilet? No     Cognitive Function:   Normal cognitive status assessed by direct observation by this Nurse Health Advisor. No abnormalities found.    6CIT Screen 02/03/2019  What Year? 0 points  What month? 0 points  What time? 0 points  Count back from 20 0 points  Months in reverse 0 points  Repeat phrase 0 points  Total Score 0    Immunizations Immunization History  Administered Date(s) Administered  . Influenza Whole 06/08/2010  . Influenza, High Dose Seasonal PF 06/17/2019  . Influenza,inj,Quad PF,6+ Mos 07/07/2013, 07/06/2014, 07/17/2017  . Influenza-Unspecified 08/05/2016  . Pneumococcal Conjugate-13 11/25/2013  . Pneumococcal Polysaccharide-23 09/09/2007    TDAP status: Due, Education has been provided regarding the importance of this vaccine. Advised may receive this vaccine at local pharmacy or Health Dept. Aware to provide a copy of the vaccination record if obtained from local pharmacy or Health Dept. Verbalized acceptance and understanding.  Flu Vaccine status: Due, Education has been provided regarding the importance of this vaccine. Advised may receive this vaccine at local pharmacy or Health Dept. Aware to provide a copy of the vaccination record if obtained from local pharmacy or Health Dept. Verbalized acceptance and understanding.   Pneumococcal vaccine status: Up to date  Covid-19 vaccine status: Completed vaccines  Qualifies for Shingles Vaccine? Yes   Zostavax completed No   Shingrix Completed?: No.    Education has been provided regarding the importance of this vaccine. Patient has been advised to call insurance company to determine out of pocket expense if they have not yet received this  vaccine. Advised may also receive vaccine at local pharmacy or Health Dept. Verbalized acceptance and understanding.  Screening Tests Health Maintenance  Topic Date Due  . COVID-19 Vaccine (1) Never done  . PNA vac Low Risk Adult (2 of 2 - PPSV23) 11/26/2014  . OPHTHALMOLOGY EXAM  05/05/2015  . FOOT EXAM  11/10/2019  . URINE MICROALBUMIN  10/02/2020  . TETANUS/TDAP  01/06/2021  . HEMOGLOBIN A1C  04/02/2021  . INFLUENZA VACCINE  04/08/2021  . DEXA SCAN  Completed  . Hepatitis C Screening  Completed  . HPV VACCINES  Aged Out    Health Maintenance  Health Maintenance Due  Topic Date Due  . COVID-19 Vaccine (1) Never done  . PNA vac Low Risk Adult (2 of 2 - PPSV23) 11/26/2014  . OPHTHALMOLOGY EXAM  05/05/2015  . FOOT EXAM  11/10/2019  . URINE MICROALBUMIN  10/02/2020  . TETANUS/TDAP  01/06/2021    Colorectal cancer screening: Referral to GI placed 01/28/2021. Pt aware the office will call re: appt.  Mammogram status: Ordered 01/28/2021. Pt provided with contact info and advised to call to schedule appt.   Bone Density status: Completed 01/05/2018. Results reflect: Bone density results: OSTEOPENIA. Repeat every 5 years.  Lung Cancer Screening: (Low Dose CT Chest recommended if Age 90-80 years, 30 pack-year currently smoking OR have quit w/in 15years.) does not qualify.   Lung Cancer Screening Referral: N/A   Additional Screening:  Hepatitis C Screening: does qualify; Completed 12/09/2017  Vision Screening: Recommended annual ophthalmology exams for early detection of glaucoma and other disorders of the eye. Is the patient up to date with their annual eye exam?  Yes  Who is the provider or what is the name of the office in which the patient attends annual eye exams? Dr. Rosana Hoes If pt is not established with a provider, would they like to be referred to a provider to establish care? No .   Dental Screening: Recommended annual dental exams for proper oral hygiene  Community  Resource Referral / Chronic Care Management: CRR required this visit?  No   CCM required this visit?  No      Plan:     I have personally reviewed and noted the following in the patient's chart:   . Medical and social history . Use of alcohol, tobacco or illicit drugs  . Current medications and supplements including opioid prescriptions.  . Functional ability and status . Nutritional status . Physical activity . Advanced directives . List of other physicians . Hospitalizations, surgeries, and ER visits in previous 12 months . Vitals . Screenings to include cognitive, depression, and falls . Referrals and appointments  In addition, I have reviewed and discussed with patient certain preventive protocols, quality metrics, and best practice recommendations. A written personalized care plan for preventive services as well as general preventive health recommendations were provided to patient.     Ofilia Neas, LPN   [id number]   Nurse Notes: None

## 2021-01-28 ENCOUNTER — Ambulatory Visit (INDEPENDENT_AMBULATORY_CARE_PROVIDER_SITE_OTHER): Payer: HMO

## 2021-01-28 ENCOUNTER — Other Ambulatory Visit: Payer: Self-pay

## 2021-01-28 DIAGNOSIS — Z0000 Encounter for general adult medical examination without abnormal findings: Secondary | ICD-10-CM

## 2021-01-28 NOTE — Patient Instructions (Addendum)
Elizabeth [name] , Thank you for taking time to come for your Medicare Wellness Visit. I appreciate your ongoing commitment to your health goals. Please review the following plan we discussed and let me know if I can assist you in the future.   Screening recommendations/referrals: Colonoscopy: Currently due for colonscopy, please discuss with Dr. Laural Golden at your next office visit in october Mammogram: Up to date, next due 01/2022 Bone Density: Up to date, next due 01/06/2023 Recommended yearly ophthalmology/optometry visit for glaucoma screening and checkup Recommended yearly dental visit for hygiene and checkup  Vaccinations: Influenza vaccine: Up to date, next due fall 2022  Pneumococcal vaccine: Completed series  Tdap vaccine: Currently due, you may await and injury to receive Shingles vaccine: Currently due, if you would like to receive Shingrix we recommend that you do so at your local pharmacy.  Advanced directives: Please bring in copies of your advanced medical directives so that we may scan them into your chart.   Conditions/risks identified: None   Next appointment: 01/29/2021 @ 3:00 PM with [place] 65 Years and Older, Female Preventive care refers to lifestyle choices and visits with your health care provider that can promote health and wellness. What does preventive care include?  A yearly physical exam. This is also called an annual well check.  Dental exams once or twice a year.  Routine eye exams. Ask your health care provider how often you should have your eyes checked.  Personal lifestyle choices, including:  Daily care of your teeth and gums.  Regular physical activity.  Eating a healthy diet.  Avoiding tobacco and drug use.  Limiting alcohol use.  Practicing safe sex.  Taking low-dose aspirin every day.  Taking vitamin and mineral supplements as recommended by your health care provider. What happens during an annual well  check? The services and screenings done by your health care provider during your annual well check will depend on your age, overall health, lifestyle risk factors, and family history of disease. Counseling  Your health care provider may ask you questions about your:  Alcohol use.  Tobacco use.  Drug use.  Emotional well-being.  Home and relationship well-being.  Sexual activity.  Eating habits.  History of falls.  Memory and ability to understand (cognition).  Work and work Statistician.  Reproductive health. Screening  You may have the following tests or measurements:  Height, weight, and BMI.  Blood pressure.  Lipid and cholesterol levels. These may be checked every 5 years, or more frequently if you are over 82 years old.  Skin check.  Lung cancer screening. You may have this screening every year starting at age 72 if you have a 30-pack-year history of smoking and currently smoke or have quit within the past 15 years.  Fecal occult blood test (FOBT) of the stool. You may have this test every year starting at age 41.  Flexible sigmoidoscopy or colonoscopy. You may have a sigmoidoscopy every 5 years or a colonoscopy every 10 years starting at age 31.  Hepatitis C blood test.  Hepatitis B blood test.  Sexually transmitted disease (STD) testing.  Diabetes screening. This is done by checking your blood sugar (glucose) after you have not eaten for a while (fasting). You may have this done every 1-3 years.  Bone density scan. This is done to screen for osteoporosis. You may have this done starting at age 47.  Mammogram. This may be done every 1-2 years. Talk to your health  care provider about how often you should have regular mammograms. Talk with your health care provider about your test results, treatment options, and if necessary, the need for more tests. Vaccines  Your health care provider may recommend certain vaccines, such as:  Influenza vaccine. This is  recommended every year.  Tetanus, diphtheria, and acellular pertussis (Tdap, Td) vaccine. You may need a Td booster every 10 years.  Zoster vaccine. You may need this after age 22.  Pneumococcal 13-valent conjugate (PCV13) vaccine. One dose is recommended after age 10.  Pneumococcal polysaccharide (PPSV23) vaccine. One dose is recommended after age 13. Talk to your health care provider about which screenings and vaccines you need and how often you need them. This information is not intended to replace advice given to you by your health care provider. Make sure you discuss any questions you have with your health care provider. Document Released: 09/21/2015 Document Revised: 05/14/2016 Document Reviewed: 06/26/2015 Elsevier Interactive Patient Education  2017 [place] Prevention in the Home Falls can cause injuries. They can happen to people of all ages. There are many things you can do to make your home safe and to help prevent falls. What can I do on the outside of my home?  Regularly fix the edges of walkways and driveways and fix any cracks.  Remove anything that might make you trip as you walk through a door, such as a raised step or threshold.  Trim any bushes or trees on the path to your home.  Use bright outdoor lighting.  Clear any walking paths of anything that might make someone trip, such as rocks or tools.  Regularly check to see if handrails are loose or broken. Make sure that both sides of any steps have handrails.  Any raised decks and porches should have guardrails on the edges.  Have any leaves, snow, or ice cleared regularly.  Use sand or salt on walking paths during winter.  Clean up any spills in your garage right away. This includes oil or grease spills. What can I do in the bathroom?  Use night lights.  Install grab bars by the toilet and in the tub and shower. Do not use towel bars as grab bars.  Use non-skid mats or decals in the tub or  shower.  If you need to sit down in the shower, use a plastic, non-slip stool.  Keep the floor dry. Clean up any water that spills on the floor as soon as it happens.  Remove soap buildup in the tub or shower regularly.  Attach bath mats securely with double-sided non-slip rug tape.  Do not have throw rugs and other things on the floor that can make you trip. What can I do in the bedroom?  Use night lights.  Make sure that you have a light by your bed that is easy to reach.  Do not use any sheets or blankets that are too big for your bed. They should not hang down onto the floor.  Have a firm chair that has side arms. You can use this for support while you get dressed.  Do not have throw rugs and other things on the floor that can make you trip. What can I do in the kitchen?  Clean up any spills right away.  Avoid walking on wet floors.  Keep items that you use a lot in easy-to-reach places.  If you need to reach something above you, use a strong step stool that has a grab  bar.  Keep electrical cords out of the way.  Do not use floor polish or wax that makes floors slippery. If you must use wax, use non-skid floor wax.  Do not have throw rugs and other things on the floor that can make you trip. What can I do with my stairs?  Do not leave any items on the stairs.  Make sure that there are handrails on both sides of the stairs and use them. Fix handrails that are broken or loose. Make sure that handrails are as long as the stairways.  Check any carpeting to make sure that it is firmly attached to the stairs. Fix any carpet that is loose or worn.  Avoid having throw rugs at the top or bottom of the stairs. If you do have throw rugs, attach them to the floor with carpet tape.  Make sure that you have a light switch at the top of the stairs and the bottom of the stairs. If you do not have them, ask someone to add them for you. What else can I do to help prevent  falls?  Wear shoes that:  Do not have high heels.  Have rubber bottoms.  Are comfortable and fit you well.  Are closed at the toe. Do not wear sandals.  If you use a stepladder:  Make sure that it is fully opened. Do not climb a closed stepladder.  Make sure that both sides of the stepladder are locked into place.  Ask someone to hold it for you, if possible.  Clearly mark and make sure that you can see:  Any grab bars or handrails.  First and last steps.  Where the edge of each step is.  Use tools that help you move around (mobility aids) if they are needed. These include:  Canes.  Walkers.  Scooters.  Crutches.  Turn on the lights when you go into a dark area. Replace any light bulbs as soon as they burn out.  Set up your furniture so you have a clear path. Avoid moving your furniture around.  If any of your floors are uneven, fix them.  If there are any pets around you, be aware of where they are.  Review your medicines with your doctor. Some medicines can make you feel dizzy. This can increase your chance of falling. Ask your doctor what other things that you can do to help prevent falls. This information is not intended to replace advice given to you by your health care provider. Make sure you discuss any questions you have with your health care provider. Document Released: 06/21/2009 Document Revised: 01/31/2016 Document Reviewed: 09/29/2014 Elsevier Interactive Patient Education  2017 [employer].

## 2021-02-05 ENCOUNTER — Encounter: Payer: Self-pay | Admitting: Nurse Practitioner

## 2021-02-05 ENCOUNTER — Ambulatory Visit (INDEPENDENT_AMBULATORY_CARE_PROVIDER_SITE_OTHER): Payer: HMO | Admitting: Nurse Practitioner

## 2021-02-05 ENCOUNTER — Other Ambulatory Visit: Payer: Self-pay

## 2021-02-05 DIAGNOSIS — E785 Hyperlipidemia, unspecified: Secondary | ICD-10-CM | POA: Diagnosis not present

## 2021-02-05 DIAGNOSIS — D509 Iron deficiency anemia, unspecified: Secondary | ICD-10-CM

## 2021-02-05 DIAGNOSIS — E1169 Type 2 diabetes mellitus with other specified complication: Secondary | ICD-10-CM

## 2021-02-05 DIAGNOSIS — E7849 Other hyperlipidemia: Secondary | ICD-10-CM

## 2021-02-05 NOTE — Assessment & Plan Note (Signed)
-  lipid panel ordered -goal LDL < 70

## 2021-02-05 NOTE — Assessment & Plan Note (Signed)
-  has upcoming appointment with Dr. Delton Coombes -she states she has had 2 iron infusions so far -CBC ordered

## 2021-02-05 NOTE — Assessment & Plan Note (Addendum)
-  ordered A1c and lipids -taking jardiance -not on ACEi/ARB or Statin; has statin intolerance and ACEi allergy

## 2021-02-05 NOTE — Progress Notes (Signed)
Established Patient Office Visit  Subjective:  Patient ID: Elizabeth [name], female    DOB: [birth year]  Age: 80 y.o. MRN: [id number]  CC:  Chief Complaint  Patient presents with  . Diabetes  . Anemia    HPI Elizabeth [name] presents for lab follow-up. She didn't have labs drawn prior to this appointment.  She states her blood sugar has been between 100 and 118.  Past Medical History:  Diagnosis Date  . Absolute anemia 07/28/2018  . Active smoker   . Allergy   . Anxiety    Takes Xanax for anxiety  . Arthritis   . Asthma   . Cataract   . Chronic airway obstruction, not elsewhere classified   . Clotting disorder ([place])    stoke   . Depression   . Diarrhea   . Diverticulosis of colon (without mention of hemorrhage)   . Esophageal reflux   . Family history of malignant neoplasm of gastrointestinal tract   . GI bleed   . Headache(784.0)    Irregular  . Neuromuscular disorder ([facility])    numbness is left hand and left elbow  . Obesity, unspecified   . Other and unspecified hyperlipidemia   . Personal history of colonic polyps   . Stricture and stenosis of esophagus   . Stroke ([place])   . Thyroid disease   . Type II or unspecified type diabetes mellitus without mention of complication, not stated as uncontrolled   . Unspecified asthma(493.90)   . Unspecified essential hypertension     Past Surgical History:  Procedure Laterality Date  . APPENDECTOMY    . BACK SURGERY     Spinal surgery  . BRAIN SURGERY     2013 - stoke ( fall in 2010)   . CARDIAC CATHETERIZATION  2006   LAD: 30%, RCA : 20%, normal EF, elevated LVEDP  . CARDIAC CATHETERIZATION    . CEREBRAL ANGIOGRAM  12/20/2015  . COLONOSCOPY N/A 08/09/2018   Procedure: COLONOSCOPY;  Surgeon: Rogene Houston, MD;  [facility];  Service: Endoscopy;  Laterality: N/A;  1:55  . ESOPHAGEAL DILATION N/A 08/09/2018   Procedure: ESOPHAGEAL DILATION;  Surgeon: Rogene Houston, MD;  [facility];  Service:  Endoscopy;  Laterality: N/A;  . ESOPHAGOGASTRODUODENOSCOPY N/A 08/09/2018   Procedure: ESOPHAGOGASTRODUODENOSCOPY (EGD);  Surgeon: Rogene Houston, MD;  [facility];  Service: Endoscopy;  Laterality: N/A;  . GIVENS CAPSULE STUDY N/A 08/25/2018   Procedure: GIVENS CAPSULE STUDY;  Surgeon: Rogene Houston, MD;  [facility];  Service: Endoscopy;  Laterality: N/A;  . IR ANGIO INTRA EXTRACRAN SEL COM CAROTID INNOMINATE BILAT MOD SED  01/14/2018  . IR ANGIO INTRA EXTRACRAN SEL COM CAROTID INNOMINATE BILAT MOD SED  05/11/2018  . IR ANGIO VERTEBRAL SEL SUBCLAVIAN INNOMINATE UNI L MOD SED  01/14/2018  . IR ANGIO VERTEBRAL SEL SUBCLAVIAN INNOMINATE UNI L MOD SED  05/11/2018  . IR ANGIO VERTEBRAL SEL VERTEBRAL UNI R MOD SED  01/14/2018  . IR ANGIO VERTEBRAL SEL VERTEBRAL UNI R MOD SED  05/11/2018  . KNEE ARTHROSCOPY     left  . LUMBAR DISC SURGERY    . POLYPECTOMY  08/09/2018   Procedure: POLYPECTOMY;  Surgeon: Rogene Houston, MD;  [facility];  Service: Endoscopy;;  ascending colon (CS x 2, HS x5) hepatic flexure (HSx1), recto-sigmoid (HSx1)  . TONSILLECTOMY    . TOTAL ABDOMINAL HYSTERECTOMY     menopause/bleeding  Family History  Problem Relation Age of Onset  . Diabetes Mother   . Heart disease Mother   . Asthma Mother   . Kidney disease Mother   . Drug abuse Mother   . Hypertension Mother   . Stroke Mother        fall -several bleed on brain Nov 02, 1988  . Early death Father        trauma  . Colon cancer Other        first cousin, paternal aunt and uncle  . Cancer Paternal Grandmother        gastric  . Diabetes Paternal Grandmother   . Mental illness Paternal Grandmother   . Heart disease Maternal Grandmother   . Thyroid cancer Other        aunt  . Heart disease Sister   . Mental illness Sister   . Heart attack Sister   . Heart disease Brother   . Lung disease Brother   . Cancer Sister        pancreatic  . Diabetes Sister   . Hypertension Sister   .  Tuberculosis Maternal Grandfather   . Heart disease Paternal Grandfather     Social History   Socioeconomic History  . Marital status: Married    Spouse name: [name]  . Number of children: 0  . Years of education: 16  . Highest education level: Bachelor's degree (e.g., BA, AB, BS)  Occupational History  . 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 Smoking status: Former Smoker    Packs/day: 1.00    Years: 20.00    Pack years: 20.00    Types: Cigarettes    Start date: 01/15/1983    Quit date: 11/09/2020    Years since quitting: 0.2  . Smokeless tobacco: Never Used  Vaping Use  . Vaping Use: Never used  Substance and Sexual Activity  . Alcohol use: No    Alcohol/week: 0.0 standard drinks  . Drug use: No  . Sexual activity: Not Currently  Other Topics Concern  . Not on file  Social History Narrative   Married [name]   Limited activity due to back pain   Social Determinants of Health   Financial Resource Strain: Low Risk   . Difficulty of Paying Living Expenses: Not hard at all  Food Insecurity: No Food Insecurity  . Worried About Charity fundraiser in the Last Year: Never true  . Ran Out of Food in the Last Year: Never true  Transportation Needs: No Transportation Needs  . Lack of Transportation (Medical): No  . Lack of Transportation (Non-Medical): No  Physical Activity: Insufficiently Active  . Days of Exercise per Week: 3 days  . Minutes of Exercise per Session: 20 min  Stress: No Stress Concern Present  . Feeling of Stress : Not at all  Social Connections: Moderately Isolated  . Frequency of Communication with Friends and Family: More than three times a week  . Frequency of Social Gatherings with Friends and Family: More than three times a week  . Attends Religious Services: Never  . Active Member of Clubs or Organizations: No  . Attends Archivist Meetings: Never  . Marital Status: Married  Human resources officer Violence:  Not At Risk  . Fear of Current or Ex-Partner: No  . Emotionally Abused: No  . Physically Abused: No  . Sexually Abused: No    Outpatient Medications Prior to Visit  Medication Sig  Dispense Refill  . albuterol (VENTOLIN HFA) 108 (90 Base) MCG/ACT inhaler INHALE 2 PUFFS EVERY SIX HOURS AS NEEDED FOR WHEEZING OR SHORTNESS OF BREATH 8.5 g 2  . amLODipine (NORVASC) 10 MG tablet Take 1 tablet (10 mg total) by mouth daily. 90 tablet 1  . aspirin 325 MG tablet Take 325 mg by mouth daily.    . Carboxymethylcellulose Sodium 1 % GEL Place 1 drop into both eyes 2 (two) times daily.    . cetirizine (ZYRTEC) 10 MG tablet Take 10 mg by mouth daily.    Marland Kitchen CINNAMON PO Take 1,000 mg by mouth daily as needed (for blood sugars).     . clopidogrel (PLAVIX) 75 MG tablet TAKE ONE TABLET BY MOUTH DAILY (PLEASE USE AUROBINDO BRAND, PT HD FOR ALLERGY TO REDDY). Hold for 5 days 90 tablet 1  . JARDIANCE 10 MG TABS tablet TAKE 1 TABLET BY MOUTH ONCE DAILY (TO replace farxiga)    . pantoprazole (PROTONIX) 40 MG tablet TAKE 1 TABLET BY MOUTH DAILY BEFORE BREAKFAST 30 tablet 5  . ursodiol (ACTIGALL) 300 MG capsule Take 1 capsule (300 mg total) by mouth 3 (three) times daily. 270 capsule 3   No facility-administered medications prior to visit.    Allergies  Allergen Reactions  . Banana Anaphylaxis  . Codeine Nausea And Vomiting and Other (See Comments)    PROJECTILE VOMITING  . Stevia [Stevioside] Swelling and Other (See Comments)    Face, tongue, and eye swelling   . Actos [Pioglitazone] Swelling  . Benicar [Olmesartan Medoxomil] Other (See Comments)    Feel bad  . Chantix [Varenicline] Other (See Comments)    "Worked opposite."  . Diovan [Valsartan] Other (See Comments)    Feel bad  . Lipitor [Atorvastatin] Other (See Comments)    REACTION:  Joint pain  . Spinach Other (See Comments)    Stomach problems  . Vicodin [Hydrocodone-Acetaminophen] Nausea And Vomiting  . Zocor [Simvastatin] Other (See Comments)     REACTION:  "made liver function incorrectly"  . Ace Inhibitors Cough  . Celebrex [Celecoxib] Rash  . Latex Rash and Other (See Comments)    REACTION: red rash  . Metformin And Related     GI - upset   . Penicillins Rash and Other (See Comments)    Has patient had a PCN reaction causing immediate rash, facial/tongue/throat swelling, SOB or lightheadedness with hypotension: No Has patient had a PCN reaction causing severe rash involving mucus membranes or skin necrosis: No Has patient had a PCN reaction that required hospitalization: No- MD office Has patient had a PCN reaction occurring within the last 10 years: No If all of the above answers are "NO", then may proceed with Cephalosporin use.   . Sulfonamide Derivatives Rash    ROS Review of Systems  Constitutional: Negative.   Respiratory: Negative.   Cardiovascular: Negative.   Psychiatric/Behavioral: Negative.       Objective:    Physical Exam Constitutional:      Appearance: Normal appearance.  Cardiovascular:     Rate and Rhythm: Normal rate and regular rhythm.     Pulses: Normal pulses.     Heart sounds: Normal heart sounds.  Pulmonary:     Effort: Pulmonary effort is normal.     Breath sounds: Normal breath sounds.  Musculoskeletal:     Comments: Wearing left knee brace  Neurological:     Mental Status: She is alert.  Psychiatric:        Mood and Affect: Mood  normal.        Behavior: Behavior normal.        Thought Content: Thought content normal.        Judgment: Judgment normal.     BP 117/78   Pulse 92   Temp 98.9 F (37.2 C)   Resp 20   Ht _0  (1.575 m)   Wt 151 lb (68.5 kg)   SpO2 93%   BMI 27.62 kg/m  Wt Readings from Last 3 Encounters:  02/05/21 151 lb (68.5 kg)  12/25/20 152 lb 1.6 oz (69 kg)  11/13/20 151 lb 12.8 oz (68.9 kg)     Health Maintenance Due  Topic Date Due  . COVID-19 Vaccine (1) Never done  . Zoster Vaccines- Shingrix (1 of 2) Never done  . PNA vac Low Risk Adult  (2 of 2 - PPSV23) 11/26/2014  . OPHTHALMOLOGY EXAM  05/05/2015  . FOOT EXAM  11/10/2019  . URINE MICROALBUMIN  10/02/2020  . TETANUS/TDAP  01/06/2021    There are no preventive care reminders to display for this patient.  Lab Results  Component Value Date   TSH 1.790 10/03/2020   Lab Results  Component Value Date   WBC 5.3 11/05/2020   HGB 13.0 11/05/2020   HCT 41.3 11/05/2020   MCV 76 (L) 11/05/2020   PLT 252 11/05/2020   Lab Results  Component Value Date   NA 137 11/05/2020   K 4.2 11/05/2020   CO2 22 11/05/2020   GLUCOSE 125 (H) 11/05/2020   BUN 12 11/05/2020   CREATININE 0.81 11/05/2020   BILITOT 0.4 11/05/2020   ALKPHOS 146 (H) 11/05/2020   AST 27 11/05/2020   ALT 23 11/05/2020   PROT 7.2 11/05/2020   ALBUMIN 4.4 11/05/2020   CALCIUM 10.0 11/05/2020   ANIONGAP 9 08/18/2018   EGFR 75 11/05/2020   Lab Results  Component Value Date   CHOL 218 (H) 10/03/2020   Lab Results  Component Value Date   HDL 45 10/03/2020   Lab Results  Component Value Date   LDLCALC 154 (H) 10/03/2020   Lab Results  Component Value Date   TRIG 105 10/03/2020   Lab Results  Component Value Date   CHOLHDL 6.5 (H) 03/30/2019   Lab Results  Component Value Date   HGBA1C 6.2 (H) 10/03/2020      Assessment & Plan:   Problem List Items Addressed This Visit      Endocrine   Type 2 diabetes mellitus with hyperlipidemia ([place])    -ordered A1c and lipids -taking jardiance -not on ACEi/ARB or Statin; has statin intolerance and ACEi allergy      Relevant Orders   CBC with Differential/Platelet   CMP14+EGFR   Lipid Panel With LDL/HDL Ratio   Hemoglobin A1c     Other   Hyperlipidemia    -lipid panel ordered -goal LDL < 70      Relevant Orders   Lipid Panel With LDL/HDL Ratio   Iron deficiency anemia    -has upcoming appointment with Dr. Delton Coombes -she states she has had 2 iron infusions so far -CBC ordered      Relevant Orders   CBC with Differential/Platelet       No orders of the defined types were placed in this encounter.   Follow-up: Return in about 4 months (around 06/07/2021) for Lab follow-up (DM)- same-day fasting labs.    Noreene Larsson, NP

## 2021-02-05 NOTE — Patient Instructions (Signed)
Please have fasting labs drawn this week.  For the next office visit, we will get same-day fasting labs.

## 2021-02-12 ENCOUNTER — Other Ambulatory Visit (HOSPITAL_COMMUNITY): Payer: Self-pay | Admitting: *Deleted

## 2021-02-12 DIAGNOSIS — D509 Iron deficiency anemia, unspecified: Secondary | ICD-10-CM

## 2021-02-12 DIAGNOSIS — D649 Anemia, unspecified: Secondary | ICD-10-CM

## 2021-02-13 ENCOUNTER — Inpatient Hospital Stay (HOSPITAL_COMMUNITY): Payer: HMO

## 2021-02-14 ENCOUNTER — Other Ambulatory Visit: Payer: Self-pay

## 2021-02-14 ENCOUNTER — Inpatient Hospital Stay (HOSPITAL_COMMUNITY): Payer: HMO | Attending: Hematology

## 2021-02-14 DIAGNOSIS — Z87891 Personal history of nicotine dependence: Secondary | ICD-10-CM | POA: Insufficient documentation

## 2021-02-14 DIAGNOSIS — Z808 Family history of malignant neoplasm of other organs or systems: Secondary | ICD-10-CM | POA: Insufficient documentation

## 2021-02-14 DIAGNOSIS — D509 Iron deficiency anemia, unspecified: Secondary | ICD-10-CM | POA: Diagnosis present

## 2021-02-14 DIAGNOSIS — D649 Anemia, unspecified: Secondary | ICD-10-CM

## 2021-02-14 DIAGNOSIS — Z803 Family history of malignant neoplasm of breast: Secondary | ICD-10-CM | POA: Insufficient documentation

## 2021-02-14 DIAGNOSIS — Z86010 Personal history of colonic polyps: Secondary | ICD-10-CM | POA: Insufficient documentation

## 2021-02-14 DIAGNOSIS — K743 Primary biliary cirrhosis: Secondary | ICD-10-CM | POA: Diagnosis not present

## 2021-02-14 DIAGNOSIS — Z8673 Personal history of transient ischemic attack (TIA), and cerebral infarction without residual deficits: Secondary | ICD-10-CM | POA: Diagnosis not present

## 2021-02-14 DIAGNOSIS — Z800 Family history of malignant neoplasm of digestive organs: Secondary | ICD-10-CM | POA: Diagnosis not present

## 2021-02-14 LAB — FERRITIN: Ferritin: 93 ng/mL (ref 11–307)

## 2021-02-14 LAB — CBC WITH DIFFERENTIAL/PLATELET
Abs Immature Granulocytes: 0.02 10*3/uL (ref 0.00–0.07)
Basophils Absolute: 0.1 10*3/uL (ref 0.0–0.1)
Basophils Relative: 1 %
Eosinophils Absolute: 0.1 10*3/uL (ref 0.0–0.5)
Eosinophils Relative: 3 %
HCT: 47.3 % — ABNORMAL HIGH (ref 36.0–46.0)
Hemoglobin: 15.8 g/dL — ABNORMAL HIGH (ref 12.0–15.0)
Immature Granulocytes: 0 %
Lymphocytes Relative: 27 %
Lymphs Abs: 1.5 10*3/uL (ref 0.7–4.0)
MCH: 29.2 pg (ref 26.0–34.0)
MCHC: 33.4 g/dL (ref 30.0–36.0)
MCV: 87.4 fL (ref 80.0–100.0)
Monocytes Absolute: 0.3 10*3/uL (ref 0.1–1.0)
Monocytes Relative: 6 %
Neutro Abs: 3.5 10*3/uL (ref 1.7–7.7)
Neutrophils Relative %: 63 %
Platelets: 199 10*3/uL (ref 150–400)
RBC: 5.41 MIL/uL — ABNORMAL HIGH (ref 3.87–5.11)
RDW: 15.8 % — ABNORMAL HIGH (ref 11.5–15.5)
WBC: 5.5 10*3/uL (ref 4.0–10.5)
nRBC: 0 % (ref 0.0–0.2)

## 2021-02-14 LAB — COMPREHENSIVE METABOLIC PANEL
ALT: 28 U/L (ref 0–44)
AST: 27 U/L (ref 15–41)
Albumin: 4.1 g/dL (ref 3.5–5.0)
Alkaline Phosphatase: 98 U/L (ref 38–126)
Anion gap: 6 (ref 5–15)
BUN: 10 mg/dL (ref 8–23)
CO2: 27 mmol/L (ref 22–32)
Calcium: 9.2 mg/dL (ref 8.9–10.3)
Chloride: 100 mmol/L (ref 98–111)
Creatinine, Ser: 0.57 mg/dL (ref 0.44–1.00)
GFR, Estimated: >60 mL/min (ref >60)
Glucose, Bld: 131 mg/dL — ABNORMAL HIGH (ref 70–99)
Potassium: 3.7 mmol/L (ref 3.5–5.1)
Sodium: 133 mmol/L — ABNORMAL LOW (ref 135–145)
Total Bilirubin: 1.3 mg/dL — ABNORMAL HIGH (ref 0.3–1.2)
Total Protein: 7.3 g/dL (ref 6.5–8.1)

## 2021-02-14 LAB — IRON AND TIBC
Iron: 129 ug/dL (ref 28–170)
Saturation Ratios: 32 % — ABNORMAL HIGH (ref 10.4–31.8)
TIBC: 400 ug/dL (ref 250–450)
UIBC: 271 ug/dL

## 2021-02-19 NOTE — Progress Notes (Signed)
[place], [place] [address]   CLINIC:  Medical Oncology/Hematology  PCP:  Noreene Larsson, NP [address]  [address] / [place] [address]  [phone]  REASON FOR VISIT:  Follow-up for anemia  PRIOR THERAPY: none  CURRENT THERAPY: intermittent feraheme  INTERVAL HISTORY:  Elizabeth [name], a 80 y.o. female, returns for routine follow-up for her anemia. Elizabeth [name] was last seen on 11/13/2020.  Today she reports feeling good. Her energy are good, and she reports occasional nausea which she attributes to diet. She denies vomiting. She quit smoking on 11/13/20. She previously used iron supplements, but stopped because they caused constipation.   REVIEW OF SYSTEMS:  Review of Systems  Constitutional:  Positive for appetite change (75%). Negative for fatigue.  Gastrointestinal:  Positive for nausea (occasional). Negative for vomiting (occasional).  Neurological:  Positive for headaches and numbness (L arm).  Psychiatric/Behavioral:  Positive for depression. The patient is nervous/anxious.   All other systems reviewed and are negative.  PAST MEDICAL/SURGICAL HISTORY:  Past Medical History:  Diagnosis Date   Absolute anemia 07/28/2018   Active smoker    Allergy    Anxiety    Takes Xanax for anxiety   Arthritis    Asthma    Cataract    Chronic airway obstruction, not elsewhere classified    Clotting disorder ([place])    stoke    Depression    Diarrhea    Diverticulosis of colon (without mention of hemorrhage)    Esophageal reflux    Family history of malignant neoplasm of gastrointestinal tract    GI bleed    Headache(784.0)    Irregular   Neuromuscular disorder (HCC)    numbness is left hand and left elbow   Obesity, unspecified    Other and unspecified hyperlipidemia    Personal history of colonic polyps    Stricture and stenosis of esophagus    Stroke ([place])    Thyroid disease    Type II or unspecified type diabetes  mellitus without mention of complication, not stated as uncontrolled    Unspecified asthma(493.90)    Unspecified essential hypertension    Past Surgical History:  Procedure Laterality Date   APPENDECTOMY     BACK SURGERY     Spinal surgery   BRAIN SURGERY     2013 - stoke ( fall in 2010)    CARDIAC CATHETERIZATION  2006   LAD: 30%, RCA : 20%, normal EF, elevated LVEDP   CARDIAC CATHETERIZATION     CEREBRAL ANGIOGRAM  12/20/2015   COLONOSCOPY N/A 08/09/2018   Procedure: COLONOSCOPY;  Surgeon: Rogene Houston, MD;  [facility];  Service: Endoscopy;  Laterality: N/A;  1:55   ESOPHAGEAL DILATION N/A 08/09/2018   Procedure: ESOPHAGEAL DILATION;  Surgeon: Rogene Houston, MD;  [facility];  Service: Endoscopy;  Laterality: N/A;   ESOPHAGOGASTRODUODENOSCOPY N/A 08/09/2018   Procedure: ESOPHAGOGASTRODUODENOSCOPY (EGD);  Surgeon: Rogene Houston, MD;  [facility];  Service: Endoscopy;  Laterality: N/A;   GIVENS CAPSULE STUDY N/A 08/25/2018   Procedure: GIVENS CAPSULE STUDY;  Surgeon: Rogene Houston, MD;  [facility];  Service: Endoscopy;  Laterality: N/A;   IR ANGIO INTRA EXTRACRAN SEL COM CAROTID INNOMINATE BILAT MOD SED  01/14/2018   IR ANGIO INTRA EXTRACRAN SEL COM CAROTID INNOMINATE BILAT MOD SED  05/11/2018   IR ANGIO VERTEBRAL SEL SUBCLAVIAN INNOMINATE UNI L MOD SED  01/14/2018  IR ANGIO VERTEBRAL SEL SUBCLAVIAN INNOMINATE UNI L MOD SED  05/11/2018   IR ANGIO VERTEBRAL SEL VERTEBRAL UNI R MOD SED  01/14/2018   IR ANGIO VERTEBRAL SEL VERTEBRAL UNI R MOD SED  05/11/2018   KNEE ARTHROSCOPY     left   LUMBAR DISC SURGERY     POLYPECTOMY  08/09/2018   Procedure: POLYPECTOMY;  Surgeon: Rogene Houston, MD;  [facility];  Service: Endoscopy;;  ascending colon (CS x 2, HS x5) hepatic flexure (HSx1), recto-sigmoid (HSx1)   TONSILLECTOMY     TOTAL ABDOMINAL HYSTERECTOMY     menopause/bleeding    SOCIAL HISTORY:  Social History    Socioeconomic History   Marital status: Married    Spouse name: [name]   Number of children: 0   Years of education: 16   Highest education level: Bachelor's degree (e.g., BA, AB, BS)  Occupational History   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   Tobacco Use   Smoking status: Former    Packs/day: 1.00    Years: 20.00    Pack years: 20.00    Types: Cigarettes    Start date: 01/15/1983    Quit date: 11/09/2020    Years since quitting: 0.2   Smokeless tobacco: Never  Vaping Use   Vaping Use: Never used  Substance and Sexual Activity   Alcohol use: No    Alcohol/week: 0.0 standard drinks   Drug use: No   Sexual activity: Not Currently  Other Topics Concern   Not on file  Social History Narrative   Married [name]   Limited activity due to back pain   Social Determinants of Health   Financial Resource Strain: Low Risk    Difficulty of Paying Living Expenses: Not hard at all  Food Insecurity: No Food Insecurity   Worried About Charity fundraiser in the Last Year: Never true   Arboriculturist in the Last Year: Never true  Transportation Needs: No Transportation Needs   Lack of Transportation (Medical): No   Lack of Transportation (Non-Medical): No  Physical Activity: Insufficiently Active   Days of Exercise per Week: 3 days   Minutes of Exercise per Session: 20 min  Stress: No Stress Concern Present   Feeling of Stress : Not at all  Social Connections: Moderately Isolated   Frequency of Communication with Friends and Family: More than three times a week   Frequency of Social Gatherings with Friends and Family: More than three times a week   Attends Religious Services: Never   Marine scientist or Organizations: No   Attends Music therapist: Never   Marital Status: Married  Human resources officer Violence: Not At Risk   Fear of Current or Ex-Partner: No   Emotionally Abused: No   Physically Abused: No   Sexually Abused: No     FAMILY HISTORY:  Family History  Problem Relation Age of Onset   Diabetes Mother    Heart disease Mother    Asthma Mother    Kidney disease Mother    Drug abuse Mother    Hypertension Mother    Stroke Mother        fall -several bleed on brain 12/14/1988   Early death Father        trauma   Colon cancer Other        first cousin, paternal aunt and uncle   Cancer Paternal Grandmother  gastric   Diabetes Paternal Grandmother    Mental illness Paternal Grandmother    Heart disease Maternal Grandmother    Thyroid cancer Other        aunt   Heart disease Sister    Mental illness Sister    Heart attack Sister    Heart disease Brother    Lung disease Brother    Cancer Sister        pancreatic   Diabetes Sister    Hypertension Sister    Tuberculosis Maternal Grandfather    Heart disease Paternal Grandfather     CURRENT MEDICATIONS:  Current Outpatient Medications  Medication Sig Dispense Refill   albuterol (VENTOLIN HFA) 108 (90 Base) MCG/ACT inhaler INHALE 2 PUFFS EVERY SIX HOURS AS NEEDED FOR WHEEZING OR SHORTNESS OF BREATH 8.5 g 2   amLODipine (NORVASC) 10 MG tablet Take 1 tablet (10 mg total) by mouth daily. 90 tablet 1   aspirin 325 MG tablet Take 325 mg by mouth daily.     Carboxymethylcellulose Sodium 1 % GEL Place 1 drop into both eyes 2 (two) times daily.     cetirizine (ZYRTEC) 10 MG tablet Take 10 mg by mouth daily.     CINNAMON PO Take 1,000 mg by mouth daily as needed (for blood sugars).      clopidogrel (PLAVIX) 75 MG tablet TAKE ONE TABLET BY MOUTH DAILY (PLEASE USE AUROBINDO BRAND, PT HD FOR ALLERGY TO REDDY). Hold for 5 days 90 tablet 1   JARDIANCE 10 MG TABS tablet TAKE 1 TABLET BY MOUTH ONCE DAILY (TO replace farxiga)     pantoprazole (PROTONIX) 40 MG tablet TAKE 1 TABLET BY MOUTH DAILY BEFORE BREAKFAST 30 tablet 5   ursodiol (ACTIGALL) 300 MG capsule Take 1 capsule (300 mg total) by mouth 3 (three) times daily. 270 capsule 3   No current  facility-administered medications for this visit.    ALLERGIES:  Allergies  Allergen Reactions   Banana Anaphylaxis   Codeine Nausea And Vomiting and Other (See Comments)    PROJECTILE VOMITING   Stevia [Stevioside] Swelling and Other (See Comments)    Face, tongue, and eye swelling    Actos [Pioglitazone] Swelling   Benicar [Olmesartan Medoxomil] Other (See Comments)    Feel bad   Chantix [Varenicline] Other (See Comments)    "Worked opposite."   Diovan [Valsartan] Other (See Comments)    Feel bad   Lipitor [Atorvastatin] Other (See Comments)    REACTION:  Joint pain   Spinach Other (See Comments)    Stomach problems   Vicodin [Hydrocodone-Acetaminophen] Nausea And Vomiting   Zocor [Simvastatin] Other (See Comments)    REACTION:  "made liver function incorrectly"   Ace Inhibitors Cough   Celebrex [Celecoxib] Rash   Latex Rash and Other (See Comments)    REACTION: red rash   Metformin And Related     GI - upset    Penicillins Rash and Other (See Comments)    Has patient had a PCN reaction causing immediate rash, facial/tongue/throat swelling, SOB or lightheadedness with hypotension: No Has patient had a PCN reaction causing severe rash involving mucus membranes or skin necrosis: No Has patient had a PCN reaction that required hospitalization: No- MD office Has patient had a PCN reaction occurring within the last 10 years: No If all of the above answers are "NO", then may proceed with Cephalosporin use.    Sulfonamide Derivatives Rash    PHYSICAL EXAM:  Performance status (ECOG): 0 - Asymptomatic  There  were no vitals filed for this visit. Wt Readings from Last 3 Encounters:  02/05/21 151 lb (68.5 kg)  12/25/20 152 lb 1.6 oz (69 kg)  11/13/20 151 lb 12.8 oz (68.9 kg)   Physical Exam Vitals reviewed.  Constitutional:      Appearance: Normal appearance.  Cardiovascular:     Rate and Rhythm: Normal rate and regular rhythm.     Pulses: Normal pulses.     Heart  sounds: Normal heart sounds.  Pulmonary:     Effort: Pulmonary effort is normal.     Breath sounds: Normal breath sounds.  Neurological:     General: No focal deficit present.     Mental Status: She is alert and oriented to person, place, and time.  Psychiatric:        Mood and Affect: Mood normal.        Behavior: Behavior normal.    LABORATORY DATA:  I have reviewed the labs as listed.  CBC Latest Ref Rng & Units 02/14/2021 11/05/2020 10/03/2020  WBC 4.0 - 10.5 K/uL 5.5 5.3 6.0  Hemoglobin 12.0 - 15.0 g/dL 15.8(H) 13.0 10.3(L)  Hematocrit 36.0 - 46.0 % 47.3(H) 41.3 33.8(L)  Platelets 150 - 400 K/uL 199 252 331   CMP Latest Ref Rng & Units 02/14/2021 11/05/2020 10/03/2020  Glucose 70 - 99 mg/dL 131(H) 125(H) 119(H)  BUN 8 - 23 mg/dL 10 12 12   Creatinine 0.44 - 1.00 mg/dL 0.57 0.81 0.79  Sodium 135 - 145 mmol/L 133(L) 137 136  Potassium 3.5 - 5.1 mmol/L 3.7 4.2 4.2  Chloride 98 - 111 mmol/L 100 101 99  CO2 22 - 32 mmol/L 27 22 22   Calcium 8.9 - 10.3 mg/dL 9.2 10.0 10.1  Total Protein 6.5 - 8.1 g/dL 7.3 7.2 7.1  Total Bilirubin 0.3 - 1.2 mg/dL 1.3(H) 0.4 0.5  Alkaline Phos 38 - 126 U/L 98 146(H) 138(H)  AST 15 - 41 U/L 27 27 28   ALT 0 - 44 U/L 28 23 22       Component Value Date/Time   RBC 5.41 (H) 02/14/2021 0842   MCV 87.4 02/14/2021 0842   MCV 76 (L) 11/05/2020 1505   MCH 29.2 02/14/2021 0842   MCHC 33.4 02/14/2021 0842   RDW 15.8 (H) 02/14/2021 0842   RDW 21.0 (H) 11/05/2020 1505   LYMPHSABS 1.5 02/14/2021 0842   LYMPHSABS 1.6 11/05/2020 1505   MONOABS 0.3 02/14/2021 0842   EOSABS 0.1 02/14/2021 0842   EOSABS 0.1 11/05/2020 1505   BASOSABS 0.1 02/14/2021 0842   BASOSABS 0.1 11/05/2020 1505    DIAGNOSTIC IMAGING:  I have independently reviewed the scans and discussed with the patient. No results found.   ASSESSMENT:  Iron deficiency anemia: -Initially diagnosed in 2019 when she was hospitalized for GI bleed with hemoglobin of 7.2. -She received 2 units of packed  red blood cells with improvement of her hemoglobin. -Work-up included colonoscopy and EGD which revealed and 8 polyps that were removed. -Had ultrasound of her abdomen on 07/02/2020 which showed underlying hepatic cirrhosis/steatosis.  Proximal common bile duct dilatation without obstructing lesion. -She is unable to tolerate oral iron secondary to constipation. -Hemoglobin remained relatively stable from 2019 until now.  -Most recent labs from her PCPs office showed a hemoglobin of 13, MCV 76, ferritin of 18 and iron saturation 7%.   Social/family history: -Has smoked intermittently since she was 80 years old approximately 1 pack/day. -She quit smoking 3 days ago. -Has family history for pancreatic and breast cancer.  No personal history of cancer.  CVA (2013): -Currently on Plavix and aspirin.   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 -Presented with elevated LFTs. -Work-up revealed elevated antimitochondrial antibody. -Elastography revealed F2/F3 disease. -She was started on Urso which improved her LFTs. -Most recent labs from 07/02/2020 show an AST of 31 and ALT of 24. -Ultrasound of her abdomen on 06/26/20 showed common bile duct dilatation without obstruction.  Liver showed hepatic cyst steatosis and cirrhosis.  Stable.   PLAN:  Iron deficiency anemia: - Likely etiology is slow GI bleeding. - She received Feraheme on 11/19/2020 on 11/26/2020. - Ferritin has improved from 7-93. - Hemoglobin also has increased to 15.8. - Her energy levels have improved significantly.  Work-up for J49, folic acid, copper levels were normal.  SPEP was negative. - RTC 4 months with repeat labs.   Tobacco abuse: - She quit smoking around March of this year. - Reviewed CT lung cancer low-dose scan on 12/18/2020 which was lung RADS category 2. - Plan to repeat CT low-dose scan of the chest in 1 year.  Orders placed this encounter:  No orders of the defined types were placed in  this encounter.    Derek Jack, MD [facility] [phone]   I, Thana Ates, am acting as a scribe for Dr. Derek Jack.  I, Derek Jack MD, have reviewed the above documentation for accuracy and completeness, and I agree with the above.

## 2021-02-20 ENCOUNTER — Inpatient Hospital Stay (HOSPITAL_COMMUNITY): Payer: HMO | Admitting: Hematology

## 2021-02-20 ENCOUNTER — Other Ambulatory Visit: Payer: Self-pay

## 2021-02-20 VITALS — BP 152/67 | HR 89 | Resp 16 | Wt 151.0 lb

## 2021-02-20 DIAGNOSIS — D649 Anemia, unspecified: Secondary | ICD-10-CM | POA: Diagnosis not present

## 2021-02-20 DIAGNOSIS — D509 Iron deficiency anemia, unspecified: Secondary | ICD-10-CM | POA: Diagnosis not present

## 2021-02-20 NOTE — Patient Instructions (Addendum)
[facility] °Discharge Instructions ° °You were seen today by Dr. Katragadda. He went over your recent results. Dr. Katragadda will see you back in 4 months for labs and follow up. ° ° °Thank you for choosing [facility] at [facility] to provide your oncology and hematology care.  To afford each patient quality time with our provider, please arrive at least 15 minutes before your scheduled appointment time.  ° °If you have a lab appointment with the Cancer Center please come in thru the Main Entrance and check in at the main information desk ° °You need to re-schedule your appointment should you arrive 10 or more minutes late.  We strive to give you quality time with our providers, and arriving late affects you and other patients whose appointments are after yours.  Also, if you no show three or more times for appointments you may be dismissed from the clinic at the providers discretion.     °Again, thank you for choosing [facility].  Our hope is that these requests will decrease the amount of time that you wait before being seen by our physicians.       °_____________________________________________________________ ° °Should you have questions after your visit to [facility], please contact our office at [phone] between the hours of 8:00 a.m. and 4:30 p.m.  Voicemails left after 4:00 p.m. will not be returned until the following business day.  For prescription refill requests, have your pharmacy contact our office and allow 72 hours.   ° °Cancer Center Support Programs:  ° °> Cancer Support Group  °2nd Tuesday of the month 1pm-2pm, Journey Room  ° ° °

## 2021-02-25 ENCOUNTER — Other Ambulatory Visit: Payer: Self-pay | Admitting: Nurse Practitioner

## 2021-02-26 ENCOUNTER — Other Ambulatory Visit (INDEPENDENT_AMBULATORY_CARE_PROVIDER_SITE_OTHER): Payer: Self-pay | Admitting: Internal Medicine

## 2021-03-08 ENCOUNTER — Ambulatory Visit: Payer: HMO | Admitting: Cardiology

## 2021-03-08 NOTE — Progress Notes (Deleted)
Clinical Summary Elizabeth [name] is a 80 y.o.female seen today for follow up of the following medical problems   1. 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 Of note she is on plavix for prior stroke by neurologist.    - no recent chest pain. No sob or doe Compliant with meds   2. HTN - not on ACE or ARB due to allergy  - compliant with meds     3. Hyperlipidemia - 03/2019 TC 241 TG 172 HDL 37 LDL 170 - intolerant to statins.  - zetia did not lower cholesterol per her report so she stopped taking.  - has been resistant to pcsk9 inhibitors, referred to lipid clinic multiple times but has not scheduled appt   4. Chronic anemia/Fe deficiency - followed by hematology  Past Medical History:  Diagnosis Date   Absolute anemia 07/28/2018   Active smoker    Allergy    Anxiety    Takes Xanax for anxiety   Arthritis    Asthma    Cataract    Chronic airway obstruction, not elsewhere classified    Clotting disorder ([place])    stoke    Depression    Diarrhea    Diverticulosis of colon (without mention of hemorrhage)    Esophageal reflux    Family history of malignant neoplasm of gastrointestinal tract    GI bleed    Headache(784.0)    Irregular   Neuromuscular disorder (HCC)    numbness is left hand and left elbow   Obesity, unspecified    Other and unspecified hyperlipidemia    Personal history of colonic polyps    Stricture and stenosis of esophagus    Stroke ([place])    Thyroid disease    Type II or unspecified type diabetes mellitus without mention of complication, not stated as uncontrolled    Unspecified asthma(493.90)    Unspecified essential hypertension      Allergies  Allergen Reactions   Banana Anaphylaxis   Codeine Nausea And Vomiting and Other (See Comments)    PROJECTILE VOMITING   Stevia [Stevioside] Swelling and Other (See Comments)    Face, tongue, and eye swelling    Actos [Pioglitazone] Swelling   Benicar  [Olmesartan Medoxomil] Other (See Comments)    Feel bad   Chantix [Varenicline] Other (See Comments)    "Worked opposite."   Diovan [Valsartan] Other (See Comments)    Feel bad   Lipitor [Atorvastatin] Other (See Comments)    REACTION:  Joint pain   Spinach Other (See Comments)    Stomach problems   Vicodin [Hydrocodone-Acetaminophen] Nausea And Vomiting   Zocor [Simvastatin] Other (See Comments)    REACTION:  "made liver function incorrectly"   Ace Inhibitors Cough   Celebrex [Celecoxib] Rash   Latex Rash and Other (See Comments)    REACTION: red rash   Metformin And Related     GI - upset    Penicillins Rash and Other (See Comments)    Has patient had a PCN reaction causing immediate rash, facial/tongue/throat swelling, SOB or lightheadedness with hypotension: No Has patient had a PCN reaction causing severe rash involving mucus membranes or skin necrosis: No Has patient had a PCN reaction that required hospitalization: No- MD office Has patient had a PCN reaction occurring within the last 10 years: No If all of the above answers are "NO", then may proceed with Cephalosporin use.    Sulfonamide Derivatives Rash  Current Outpatient Medications  Medication Sig Dispense Refill   albuterol (VENTOLIN HFA) 108 (90 Base) MCG/ACT inhaler INHALE 2 PUFFS EVERY SIX HOURS AS NEEDED FOR WHEEZING OR SHORTNESS OF BREATH 8.5 g 2   amLODipine (NORVASC) 10 MG tablet TAKE 1 TABLET BY MOUTH EVERY DAY 90 tablet 1   aspirin 325 MG tablet Take 325 mg by mouth daily.     Carboxymethylcellulose Sodium 1 % GEL Place 1 drop into both eyes 2 (two) times daily.     cetirizine (ZYRTEC) 10 MG tablet Take 10 mg by mouth daily.     CINNAMON PO Take 1,000 mg by mouth daily as needed (for blood sugars).      clopidogrel (PLAVIX) 75 MG tablet TAKE ONE TABLET BY MOUTH DAILY (PLEASE USE AUROBINDO BRAND, PT HD FOR ALLERGY TO REDDY). Hold for 5 days 90 tablet 1   JARDIANCE 10 MG TABS tablet TAKE 1 TABLET BY  MOUTH ONCE DAILY (TO replace farxiga)     pantoprazole (PROTONIX) 40 MG tablet TAKE 1 TABLET BY MOUTH DAILY BEFORE BREAKFAST 30 tablet 5   ursodiol (ACTIGALL) 300 MG capsule Take 1 capsule (300 mg total) by mouth 3 (three) times daily. 270 capsule 3   No current facility-administered medications for this visit.     Past Surgical History:  Procedure Laterality Date   APPENDECTOMY     BACK SURGERY     Spinal surgery   BRAIN SURGERY     2013 - stoke ( fall in 2010)    CARDIAC CATHETERIZATION  2006   LAD: 30%, RCA : 20%, normal EF, elevated LVEDP   CARDIAC CATHETERIZATION     CEREBRAL ANGIOGRAM  12/20/2015   COLONOSCOPY N/A 08/09/2018   Procedure: COLONOSCOPY;  Surgeon: Rogene Houston, MD;  [facility];  Service: Endoscopy;  Laterality: N/A;  1:55   ESOPHAGEAL DILATION N/A 08/09/2018   Procedure: ESOPHAGEAL DILATION;  Surgeon: Rogene Houston, MD;  [facility]: [facility];  Service: Endoscopy;  Laterality: N/A;   ESOPHAGOGASTRODUODENOSCOPY N/A 08/09/2018   Procedure: ESOPHAGOGASTRODUODENOSCOPY (EGD);  Surgeon: Rogene Houston, MD;  [facility];  Service: Endoscopy;  Laterality: N/A;   GIVENS CAPSULE STUDY N/A 08/25/2018   Procedure: GIVENS CAPSULE STUDY;  Surgeon: Rogene Houston, MD;  [facility];  Service: Endoscopy;  Laterality: N/A;   IR ANGIO INTRA EXTRACRAN SEL COM CAROTID INNOMINATE BILAT MOD SED  01/14/2018   IR ANGIO INTRA EXTRACRAN SEL COM CAROTID INNOMINATE BILAT MOD SED  05/11/2018   IR ANGIO VERTEBRAL SEL SUBCLAVIAN INNOMINATE UNI L MOD SED  01/14/2018   IR ANGIO VERTEBRAL SEL SUBCLAVIAN INNOMINATE UNI L MOD SED  05/11/2018   IR ANGIO VERTEBRAL SEL VERTEBRAL UNI R MOD SED  01/14/2018   IR ANGIO VERTEBRAL SEL VERTEBRAL UNI R MOD SED  05/11/2018   KNEE ARTHROSCOPY     left   LUMBAR DISC SURGERY     POLYPECTOMY  08/09/2018   Procedure: POLYPECTOMY;  Surgeon: Rogene Houston, MD;  [facility]: [facility];  Service: Endoscopy;;  ascending colon (CS x  2, HS x5) hepatic flexure (HSx1), recto-sigmoid (HSx1)   TONSILLECTOMY     TOTAL ABDOMINAL HYSTERECTOMY     menopause/bleeding     Allergies  Allergen Reactions   Banana Anaphylaxis   Codeine Nausea And Vomiting and Other (See Comments)    PROJECTILE VOMITING   Stevia [Stevioside] Swelling and Other (See Comments)    Face, tongue, and eye swelling  Actos [Pioglitazone] Swelling   Benicar [Olmesartan Medoxomil] Other (See Comments)    Feel bad   Chantix [Varenicline] Other (See Comments)    "Worked opposite."   Diovan [Valsartan] Other (See Comments)    Feel bad   Lipitor [Atorvastatin] Other (See Comments)    REACTION:  Joint pain   Spinach Other (See Comments)    Stomach problems   Vicodin [Hydrocodone-Acetaminophen] Nausea And Vomiting   Zocor [Simvastatin] Other (See Comments)    REACTION:  "made liver function incorrectly"   Ace Inhibitors Cough   Celebrex [Celecoxib] Rash   Latex Rash and Other (See Comments)    REACTION: red rash   Metformin And Related     GI - upset    Penicillins Rash and Other (See Comments)    Has patient had a PCN reaction causing immediate rash, facial/tongue/throat swelling, SOB or lightheadedness with hypotension: No Has patient had a PCN reaction causing severe rash involving mucus membranes or skin necrosis: No Has patient had a PCN reaction that required hospitalization: No- MD office Has patient had a PCN reaction occurring within the last 10 years: No If all of the above answers are "NO", then may proceed with Cephalosporin use.    Sulfonamide Derivatives Rash      Family History  Problem Relation Age of Onset   Diabetes Mother    Heart disease Mother    Asthma Mother    Kidney disease Mother    Drug abuse Mother    Hypertension Mother    Stroke Mother        fall -several bleed on brain 41   Early death Father        trauma   Colon cancer Other        first cousin, paternal aunt and uncle   Cancer Paternal  Grandmother        gastric   Diabetes Paternal Grandmother    Mental illness Paternal Grandmother    Heart disease Maternal Grandmother    Thyroid cancer Other        aunt   Heart disease Sister    Mental illness Sister    Heart attack Sister    Heart disease Brother    Lung disease Brother    Cancer Sister        pancreatic   Diabetes Sister    Hypertension Sister    Tuberculosis Maternal Grandfather    Heart disease Paternal Grandfather      Social History Elizabeth [name] reports that she quit smoking about 3 months ago. Her smoking use included cigarettes. She started smoking about 38 years ago. She has a 20.00 pack-year smoking history. She has never used smokeless tobacco. Elizabeth [name] reports no history of alcohol use.   Review of Systems CONSTITUTIONAL: No weight loss, fever, chills, weakness or fatigue.  HEENT: Eyes: No visual loss, blurred vision, double vision or yellow sclerae.No hearing loss, sneezing, congestion, runny nose or sore throat.  SKIN: No rash or itching.  CARDIOVASCULAR:  RESPIRATORY: No shortness of breath, cough or sputum.  GASTROINTESTINAL: No anorexia, nausea, vomiting or diarrhea. No abdominal pain or blood.  GENITOURINARY: No burning on urination, no polyuria NEUROLOGICAL: No headache, dizziness, syncope, paralysis, ataxia, numbness or tingling in the extremities. No change in bowel or bladder control.  MUSCULOSKELETAL: No muscle, back pain, joint pain or stiffness.  LYMPHATICS: No enlarged nodes. No history of splenectomy.  PSYCHIATRIC: No history of depression or anxiety.  ENDOCRINOLOGIC: No reports of sweating, cold or heat  intolerance. No polyuria or polydipsia.  [name] Kitchen   Physical Examination There were no vitals filed for this visit. There were no vitals filed for this visit.  Gen: resting comfortably, no acute distress HEENT: no scleral icterus, pupils equal round and reactive, no palptable cervical adenopathy,  CV Resp: Clear to auscultation  bilaterally GI: abdomen is soft, non-tender, non-distended, normal bowel sounds, no hepatosplenomegaly MSK: extremities are warm, no edema.  Skin: warm, no rash Neuro:  no focal deficits Psych: appropriate affect   Diagnostic Studies  02/2012 Echo: LVEF 60-65%, mild LVH, no WMAs, grade I diastolic dysfunction,   01/5731 Cath DATE OF PROCEDURE: 12/26/2004   DATE OF DISCHARGE: 12/26/2004   CARDIAC CATHETERIZATION   INDICATIONS: Elizabeth [name] is a pleasant 80 year old woman with a history of   hypertension, dyslipidemia, type 2 diabetes mellitus, tobacco use, and   asthma. She has had recent problems with mild dyspnea and lower extremity   edema. Given her cardiac risk factor profile, she is referred for diagnostic   coronary angiography and left ventriculography to assess coronary anatomy   and left ventricular function.   PROCEDURE:   1. Left heart catheterization.   2. Selective coronary angiography.   3. Left ventriculography.   DESCRIPTION OF PROCEDURE: The area about the right femoral artery was   anesthetized 1% lidocaine and a 4-French sheath was placed in the right   femoral artery via modified Seldinger technique. Standard preformed 4-French   JL-4 and JR-4 catheters used for selective coronary angiography and angled   pigtail catheters used for left heart catheterization and left   ventriculography. All exchanges were made over wire. The patient tolerated   procedure well without immediate complications.   HEMODYNAMIC RESULTS:   1. Left ventricle 151/25 mmHg.   2. Aorta 152/76 mmHg.   ANGIOGRAPHIC FINDINGS:   1. Left main coronary artery is free of significant flow-limiting coronary   atherosclerosis.   2. The left anterior descending is a medium caliber vessel with three small   diagonal branches. There are minor Luminal irregularities noted   throughout this system with approximately 30% diffuse mid-vessel   stenosis. There is also a 30-40% ostial stenosis involving the  first   diagonal Elizabeth [name]. No flow-limiting stenoses are noted.   3. The circumflex coronary artery is a medium caliber vessel. There is a   very small Elizabeth [name] in the AV groove and two larger bifurcating obtuse   marginal branches. Minor Luminal irregularities are noted without flow-   limiting stenoses.   4. The right coronary artery is a medium caliber vessel with posterior   descending Elizabeth [name]. Minor Luminal irregularities are noted. There is a   20% stenosis in the proximal to mid-vessel.   LEFT VENTRICULOGRAPHY: Left ventriculography was performed in the RAO   projection revealing an ejection fraction approximately 65% in the setting   of ventricular ectopy without significant mitral regurgitation or focal wall   motion abnormality.   DIAGNOSES:   1. Mild coronary atherosclerosis as outlined with no flow-limiting stenoses   in the major epicardial vessels.   2. Left ventricular ejection fraction of approximately 65% in the setting   of ventricular ectopy. No significant mitral regurgitation is noted. The   left ventricular end-diastolic pressure is increased at 25 mmHg. At this   point would suspect an element of diastolic dysfunction particularly   given elevated left renal end-diastolic pressure.   The patient is already on an ACE inhibitor and diuretic. We will plan to  continue maximizing medical therapy. We will arrange a follow-up 2-D   echocardiogram for diastolic parameters and also to exclude   significantly elevated pulmonary pressures. She will have follow-up in   the office to review this and her progress.   07/26/13 Clinic EKG: sinus rhythm, normal axis, RBBB     Assessment and Plan  1. Non-obstructive CAD - no recent symptoms, continue current meds - of note on DAPT due to prior stroke from her neurologist, not for a cardiac reason   2. HTN - at goal, continue current meds   3. Hyperlipidemia - intolerant to statins - resistant to pcsk9 inhibitors, previously  referred to lipid clinic but has been resistant - asked to contact us if she decides to consider pcsk9 inhibitors      Arnoldo Lenis, M.D., F.A.C.C.

## 2021-04-08 ENCOUNTER — Telehealth: Payer: Self-pay | Admitting: Nurse Practitioner

## 2021-04-08 NOTE — Telephone Encounter (Signed)
Pt is calling she starts that Jardiance, $140.00, pt needs assistance with price, or she needs another prescription

## 2021-04-09 ENCOUNTER — Other Ambulatory Visit: Payer: Self-pay | Admitting: Nurse Practitioner

## 2021-04-09 DIAGNOSIS — E1169 Type 2 diabetes mellitus with other specified complication: Secondary | ICD-10-CM

## 2021-04-09 DIAGNOSIS — E785 Hyperlipidemia, unspecified: Secondary | ICD-10-CM

## 2021-04-09 NOTE — Telephone Encounter (Signed)
Pt informed

## 2021-04-09 NOTE — Progress Notes (Signed)
-  Pt has trouble affording jardiance; referral to clinical pharmacist

## 2021-04-09 NOTE — Telephone Encounter (Signed)
I sent a referral to our pharmacist, so we should be able to help with price or find a suitable alternative.

## 2021-04-09 NOTE — Telephone Encounter (Signed)
Voicemail would not pick up, unable to inform pt. Other number would not dial out x2.

## 2021-04-11 ENCOUNTER — Ambulatory Visit (INDEPENDENT_AMBULATORY_CARE_PROVIDER_SITE_OTHER): Payer: HMO | Admitting: Pharmacist

## 2021-04-11 ENCOUNTER — Telehealth: Payer: Self-pay | Admitting: *Deleted

## 2021-04-11 DIAGNOSIS — E785 Hyperlipidemia, unspecified: Secondary | ICD-10-CM

## 2021-04-11 DIAGNOSIS — I152 Hypertension secondary to endocrine disorders: Secondary | ICD-10-CM

## 2021-04-11 DIAGNOSIS — E7849 Other hyperlipidemia: Secondary | ICD-10-CM

## 2021-04-11 DIAGNOSIS — E1169 Type 2 diabetes mellitus with other specified complication: Secondary | ICD-10-CM

## 2021-04-11 DIAGNOSIS — E1159 Type 2 diabetes mellitus with other circulatory complications: Secondary | ICD-10-CM

## 2021-04-11 DIAGNOSIS — I639 Cerebral infarction, unspecified: Secondary | ICD-10-CM

## 2021-04-11 NOTE — Patient Instructions (Addendum)
Visit Information   PATIENT GOALS:   Goals Addressed             This Visit's Progress    Medication Management       Patient Goals/Self-Care Activities Over the next 90 days, patient will:  Check glucose at least once daily, document, and provide at future appointments Check blood pressure at least once daily, document, and provide at future appointments Collaborate with provider on medication access solutions        Consent to CCM Services: Ms. Davison was given information about Chronic Care Management services including:  CCM service includes personalized support from designated clinical staff supervised by her physician, including individualized plan of care and coordination with other care providers 24/7 contact phone numbers for assistance for urgent and routine care needs. Service will only be billed when office clinical staff spend 20 minutes or more in a month to coordinate care. Only one practitioner may furnish and bill the service in a calendar month. The patient may stop CCM services at any time (effective at the end of the month) by phone call to the office staff. The patient will be responsible for cost sharing (co-pay) of up to 20% of the service fee (after annual deductible is met).  Patient agreed to services and verbal consent obtained.   Print copy of patient instructions, educational materials, and care plan provided in person next week when she comes to the office on 04/16/21.   Face to Face appointment with care management team member scheduled for: 04/16/21  Kennon Holter, PharmD Clinical Pharmacist [facility] [phone]  CLINICAL CARE PLAN: Patient Care Plan: PharmD     Problem Identified: Medication Management   Priority: High  Onset Date: 04/11/2021     Long-Range Goal: Medication Management   Start Date: 04/11/2021  Expected End Date: 07/10/2021  This Visit's Progress: On track  Priority: High  Note:   Current Barriers:   Unable to independently afford treatment regimen Unable to achieve control of hyperlipidemia  Suboptimal therapeutic regimen for hyperlipidemia  Pharmacist Clinical Goal(s):  Over the next 90 days, patient will verbalize ability to afford treatment regimen achieve control of hyperlipidemia as evidenced by lower LDL 4-12 weeks after initiation new LDL lowering therapy adhere to plan to optimize therapeutic regimen for hyperlipidemia as evidenced by report of adherence to recommended medication management changes contact provider office for questions/concerns as evidenced notation of same in electronic health record through collaboration with PharmD and provider.    Interventions: 1:1 collaboration with Noreene Larsson, NP regarding development and update of comprehensive plan of care as evidenced by provider attestation and co-signature Inter-disciplinary care team collaboration (see longitudinal plan of care) Comprehensive medication review performed; medication list updated in electronic medical record  Diabetes: Current medications: empagliflozin 10 mg by mouth once daily Intolerances:  pioglitazone (swelling) , metformin (GI upset) Taking medications as directed: yes Side effects thought to be attributed to current medication regimen: no Denies hypoglycemic/hyperglycemic symptoms Patient endorses that her co-pay for empagliflozin was affordable until she just hit her Medicare Part D coverage gap. She just paid over $100 for her last refill but she is unable to keep paying this going forward. She has about 3 weeks worth of medication left at home. Discussed patient assistance with [employer] and will enroll her to receive Jardiance from the drug manufacturer for free shipped directly to her house.  Controlled; Most recent A1c at goal of <7% per ADA guidelines Continue empagliflozin 10 mg  by mouth once daily Check A1c, fasting lipid panel, and BMP at next PCP visit  History of  Stroke/Hyperlipidemia: Current medications: clopidogrel 75 mg by mouth daily, aspirin 325 mg by mouth daily  Patient reports that she quit smoking in March 2022 Intolerances:  multiple statins (joint pain and liver function tests elevation 2X ULN) and ezetimibe Taking medications as directed: yes Side effects thought to be attributed to current medication regimen: no Uncontrolled; LDL above goal of <55 due to extreme risk given established clinical ASCVD + diabetes per 2020 AACE/ACE guidelines and TG at goal of <150 per 2020 AACE/ACE guidelines Discussed with primary care provider and patient is a good candidate for PCSK9 inhibitor for secondary prevention of cardiovascular events. Consider addition of alirocumab (Praluent) 75 mg subcutaneously every 2 weeks. Patient would like to read more about this before committing. Will provide patient handout information and discuss further at next visit. Can enroll in patient assistance if patient agrees with therapy. Statin intolerance noted. No statin due to serious side effects (ex. Myalgias with at least 2 different statins)  Consider lowering dose of aspirin to 81 mg by mouth daily to reduce the risk of bleeding. Will discuss further with primary care provider.  Hypertension: Current medications: amlodipine 10 mg by mouth once daily Intolerances:  ACEi (cough), ARBs (feels bad) Taking medications as directed: yes Side effects thought to be attributed to current medication regimen: no Denies dizziness, lightheadedness, blurred vision, and headache Home blood pressure readings (has wrist and arm cuff): 110-130/70-80s Blood pressure control is unclear. High at last office visit, but pt reports normal blood pressures when monitors outside of office. Blood pressure goal is <130/80 mmHg per 2017 AHA/ACC guidelines. Continue amlodipine 10 mg by mouth once daily If additional blood pressure lowering is needed, consider adding one of the following: thiazide  (chlorthalidone 12.5 mg by mouth daily), beta blocker (carvedilol 3.125 mg by mouth daily), or hydralazine 25 mg by mouth three time daily.   Patient Goals/Self-Care Activities Over the next 90 days, patient will:  - check glucose at least once daily, document, and provide at future appointments check blood pressure at least once daily, document, and provide at future appointments collaborate with provider on medication access solutions  Follow Up Plan: Face to Face appointment with care management team member scheduled for: 04/16/21 Next PCP appointment scheduled for:  06/07/21

## 2021-04-11 NOTE — Progress Notes (Signed)
Chronic Care Management Pharmacy Note  04/11/2021 Name:  Elizabeth [name] MRN:  [id number] DOB:  07-07-1941  Subjective: Elizabeth [name] is an 80 y.o. year old female who is a primary patient of Noreene Larsson, NP.  The CCM team was consulted for assistance with disease management and care coordination needs.    Engaged with patient by telephone for initial visit in response to provider referral for pharmacy case management and/or care coordination services.   Consent to Services:  The patient was given the following information about Chronic Care Management services today, agreed to services, and gave verbal consent: 1. CCM service includes personalized support from designated clinical staff supervised by the primary care provider, including individualized plan of care and coordination with other care providers 2. 24/7 contact phone numbers for assistance for urgent and routine care needs. 3. Service will only be billed when office clinical staff spend 20 minutes or more in a month to coordinate care. 4. Only one practitioner may furnish and bill the service in a calendar month. 5.The patient may stop CCM services at any time (effective at the end of the month) by phone call to the office staff. 6. The patient will be responsible for cost sharing (co-pay) of up to 20% of the service fee (after annual deductible is met). Patient agreed to services and consent obtained.  Patient Care Team: Noreene Larsson, NP as PCP - General (Nurse Practitioner) Particia Nearing, OD (Optometry) Rogene Houston, MD as Consulting Physician (Gastroenterology) Luanne Bras, MD as Consulting Physician (Interventional Radiology) Beryle Lathe, [facility] as Pharmacist  Objective:  Lab Results  Component Value Date   CREATININE 0.57 02/14/2021   CREATININE 0.81 11/05/2020   CREATININE 0.79 10/03/2020    Lab Results  Component Value Date   HGBA1C 6.2 (H) 10/03/2020   Last diabetic Eye exam:  Lab Results   Component Value Date/Time   HMDIABEYEEXA No Retinopathy 05/04/2014 12:00 AM    Last diabetic Foot exam: No results found for: HMDIABFOOTEX      Component Value Date/Time   CHOL 218 (H) 10/03/2020 0838   CHOL 242 (H) 08/09/2013 0828   TRIG 105 10/03/2020 0838   TRIG 180 (H) 09/06/2015 0947   TRIG 92 08/09/2013 0828   HDL 45 10/03/2020 0838   HDL 43 09/06/2015 0947   HDL 53 08/09/2013 0828   CHOLHDL 6.5 (H) 03/30/2019 0943   CHOLHDL 7.0 01/21/2017 1015   VLDL 27 01/21/2017 1015   LDLCALC 154 (H) 10/03/2020 0838   LDLCALC 230 (H) 02/24/2014 0837   LDLCALC 171 (H) 08/09/2013 0828    Hepatic Function Latest Ref Rng & Units 02/14/2021 11/05/2020 10/03/2020  Total Protein 6.5 - 8.1 g/dL 7.3 7.2 7.1  Albumin 3.5 - 5.0 g/dL 4.1 4.4 4.4  AST 15 - 41 U/L '27 27 28  ' ALT 0 - 44 U/L '28 23 22  ' Alk Phosphatase 38 - 126 U/L 98 146(H) 138(H)  Total Bilirubin 0.3 - 1.2 mg/dL 1.3(H) 0.4 0.5  Bilirubin, Direct 0.0 - 0.2 mg/dL - - -    Lab Results  Component Value Date/Time   TSH 1.790 10/03/2020 08:38 AM   TSH 1.850 03/30/2019 09:43 AM   FREET4 1.24 10/03/2020 08:38 AM   FREET4 1.2 10/26/2008 10:16 AM    CBC Latest Ref Rng & Units 02/14/2021 11/05/2020 10/03/2020  WBC 4.0 - 10.5 K/uL 5.5 5.3 6.0  Hemoglobin 12.0 - 15.0 g/dL 15.8(H) 13.0 10.3(L)  Hematocrit 36.0 - 46.0 %  47.3(H) 41.3 33.8(L)  Platelets 150 - 400 K/uL 199 252 331   Lab Results  Component Value Date/Time   VD25OH 47.5 11/10/2018 11:48 AM   VD25OH 30.6 06/29/2018 12:06 PM   Clinical ASCVD: Yes  The ASCVD Risk score Mikey Bussing DC Jr., et al., 2013) failed to calculate for the following reasons:   The patient has a prior MI or stroke diagnosis    Social History   Tobacco Use  Smoking Status Former   Packs/day: 1.00   Years: 20.00   Pack years: 20.00   Types: Cigarettes   Start date: 01/15/1983   Quit date: 11/09/2020   Years since quitting: 0.4  Smokeless Tobacco Never   BP Readings from Last 3 Encounters:  02/20/21 (!)  152/67  02/05/21 117/78  12/25/20 (!) 176/68   Pulse Readings from Last 3 Encounters:  02/20/21 89  02/05/21 92  12/25/20 91   Wt Readings from Last 3 Encounters:  02/20/21 151 lb (68.5 kg)  02/05/21 151 lb (68.5 kg)  12/25/20 152 lb 1.6 oz (69 kg)    Assessment: Review of patient past medical history, allergies, medications, health status, including review of consultants reports, laboratory and other test data, was performed as part of comprehensive evaluation and provision of chronic care management services.   SDOH:  (Social Determinants of Health) assessments and interventions performed:  SDOH Interventions    Flowsheet Row Most Recent Value  SDOH Interventions   Financial Strain Interventions Other (Comment)  [Drug Manufacturer Patient Assistance]       CCM Care Plan  Allergies  Allergen Reactions   Banana Anaphylaxis   Codeine Nausea And Vomiting and Other (See Comments)    PROJECTILE VOMITING   Stevia [Stevioside] Swelling and Other (See Comments)    Face, tongue, and eye swelling    Actos [Pioglitazone] Swelling   Benicar [Olmesartan Medoxomil] Other (See Comments)    Feel bad   Chantix [Varenicline] Other (See Comments)    "Worked opposite."   Diovan [Valsartan] Other (See Comments)    Feel bad   Lipitor [Atorvastatin] Other (See Comments)    REACTION:  Joint pain   Spinach Other (See Comments)    Stomach problems   Vicodin [Hydrocodone-Acetaminophen] Nausea And Vomiting   Zocor [Simvastatin] Other (See Comments)    REACTION:  "made liver function incorrectly"   Ace Inhibitors Cough   Celebrex [Celecoxib] Rash   Latex Rash and Other (See Comments)    REACTION: red rash   Metformin And Related     GI - upset    Penicillins Rash and Other (See Comments)    Has patient had a PCN reaction causing immediate rash, facial/tongue/throat swelling, SOB or lightheadedness with hypotension: No Has patient had a PCN reaction causing severe rash involving mucus  membranes or skin necrosis: No Has patient had a PCN reaction that required hospitalization: No- MD office Has patient had a PCN reaction occurring within the last 10 years: No If all of the above answers are "NO", then may proceed with Cephalosporin use.    Sulfonamide Derivatives Rash    Medications Reviewed Today     Reviewed by Beryle Lathe, [facility] (Pharmacist) on 04/11/21 at 1200  Med List Status: <None>   Medication Order Taking? Sig Documenting Provider Last Dose Status Informant  albuterol (VENTOLIN HFA) 108 (90 Base) MCG/ACT inhaler [id number] No INHALE 2 PUFFS EVERY SIX HOURS AS NEEDED FOR WHEEZING OR SHORTNESS OF BREATH  Patient not taking: Reported on 04/11/2021   Lajuana Ripple,  Koleen Distance, DO Not Taking Active   amLODipine (NORVASC) 10 MG tablet [id number] Yes TAKE 1 TABLET BY MOUTH EVERY DAY Noreene Larsson, NP Taking Active   aspirin 325 MG tablet [id number] Yes Take 325 mg by mouth daily. [provider] Taking Active   Carboxymethylcellulose Sodium 1 % GEL [id number] Yes Place 1 drop into both eyes 2 (two) times daily. [provider] Taking Active Self  cetirizine (ZYRTEC) 10 MG tablet [id number] Yes Take 10 mg by mouth daily. [provider] Taking Active   CINNAMON PO [id number] Yes Take 1,000 mg by mouth daily as needed (for blood sugars).  [provider] Taking Active Self  clopidogrel (PLAVIX) 75 MG tablet [id number] Yes TAKE ONE TABLET BY MOUTH DAILY (PLEASE USE AUROBINDO BRAND, PT HD FOR ALLERGY TO REDDY). Hold for 5 days Barton Dubois, MD Taking Active   JARDIANCE 10 MG TABS tablet [id number] Yes TAKE 1 TABLET BY MOUTH ONCE DAILY (TO replace farxiga) [provider] Taking Active   pantoprazole (PROTONIX) 40 MG tablet [id number] Yes TAKE 1 TABLET BY MOUTH DAILY BEFORE BREAKFAST Rehman, Mechele Dawley, MD Taking Active   Discontinued 11/14/11 1453 (Error)   ursodiol (ACTIGALL) 300 MG capsule [id number] Yes Take 1 capsule (300 mg total) by  mouth 3 (three) times daily. 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   History of iron deficiency anemia 12/22/2019   IDA (iron deficiency anemia) 07/04/2019   Hepatic fibrosis 07/04/2019   Abnormal transaminases 07/04/2019   Hyperlipidemia associated with type 2 diabetes mellitus ([place]) 03/25/2019   Rectal bleeding    Iron deficiency anemia 08/11/2018   Esophageal dysphagia 07/28/2018   Stroke ([place])    Elevated liver function tests 10/22/2017   Anxiety and depression 10/21/2017   Abdominal aortic atherosclerosis ([place]) 10/21/2017   Type 2 diabetes mellitus with hyperlipidemia ([place]) 09/06/2015   Type 2 diabetes mellitus with peripheral neuropathy ([place]) 11/27/2014   CVA (cerebral vascular accident) ([place]) 02/27/2012   Cerebral aneurysm 02/27/2012   Tobacco abuse 02/27/2012   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 02/27/2012   Gait instability 02/27/2012   Vision disturbance following cerebrovascular accident 02/27/2012   Dyspnea 11/14/2011   Thyroid disease 10/12/2008   Diabetes ([place]) 08/29/2008   Hyperlipidemia 08/29/2008   Obesity, unspecified 08/29/2008   COPD (chronic obstructive pulmonary disease) ([place]) 08/29/2008   WEIGHT LOSS 08/29/2008   DIARRHEA 08/29/2008   Hypertension associated with diabetes ([place]) 08/28/2008   ASTHMA 08/28/2008   ESOPHAGEAL STRICTURE 08/28/2008   GERD (gastroesophageal reflux disease) 08/28/2008   HIATAL HERNIA 08/28/2008   Diverticulosis of colon 08/28/2008   COLONIC POLYPS, HX OF 08/28/2008    Immunization History  Administered Date(s) Administered   Influenza Whole 06/08/2010   Influenza, High Dose Seasonal PF 06/17/2019   Influenza,inj,Quad PF,6+ Mos 07/07/2013, 07/06/2014, 07/17/2017   Influenza-Unspecified 08/05/2016   Pneumococcal Conjugate-13  11/25/2013   Pneumococcal Polysaccharide-23 09/09/2007    Conditions to be addressed/monitored: CAD, HTN, HLD, and DMII  Care Plan : PharmD  Updates made by Beryle Lathe, [place] since 04/11/2021 12:00 AM     Problem: Medication Management   Priority: High  Onset Date: 04/11/2021     Long-Range Goal: Medication Management   Start Date: 04/11/2021  Expected End Date: 07/10/2021  This Visit's Progress: On track  Priority: High  Note:   Current Barriers:  Unable to independently afford treatment regimen Unable to achieve control of hyperlipidemia  Suboptimal therapeutic regimen for hyperlipidemia  Pharmacist Clinical Goal(s):  Over the next 90 days, patient will verbalize ability to afford treatment regimen achieve control of hyperlipidemia as evidenced by lower LDL 4-12 weeks after initiation new LDL lowering therapy adhere to plan to optimize therapeutic regimen for hyperlipidemia as evidenced by report of adherence to recommended medication management changes contact provider office for questions/concerns as evidenced notation of same in electronic health record through collaboration with PharmD and provider.    Interventions: 1:1 collaboration with Noreene Larsson, NP regarding development and update of comprehensive plan of care as evidenced by provider attestation and co-signature Inter-disciplinary care team collaboration (see longitudinal plan of care) Comprehensive medication review performed; medication list updated in electronic medical record  Diabetes: Current medications: empagliflozin 10 mg by mouth once daily Intolerances:  pioglitazone (swelling) , metformin (GI upset) Taking medications as directed: yes Side effects thought to be attributed to current medication regimen: no Denies hypoglycemic/hyperglycemic symptoms Patient endorses that her co-pay for empagliflozin was affordable until she just hit her Medicare Part D coverage gap. She just paid over $100  for her last refill but she is unable to keep paying this going forward. She has about 3 weeks worth of medication left at home. Discussed patient assistance with [employer] and will enroll her to receive Jardiance from the drug manufacturer for free shipped directly to her house.  Controlled; Most recent A1c at goal of <7% per ADA guidelines Continue empagliflozin 10 mg by mouth once daily Check A1c, fasting lipid panel, and BMP at next PCP visit  History of Stroke/Hyperlipidemia: Current medications: clopidogrel 75 mg by mouth daily, aspirin 325 mg by mouth daily  Patient reports that she quit smoking in March 2022 Intolerances:  multiple statins (joint pain and liver function tests elevation 2X ULN) and ezetimibe Taking medications as directed: yes Side effects thought to be attributed to current medication regimen: no Uncontrolled; LDL above goal of <55 due to extreme risk given established clinical ASCVD + diabetes per 2020 AACE/ACE guidelines and TG at goal of <150 per 2020 AACE/ACE guidelines Discussed with primary care provider and patient is a good candidate for PCSK9 inhibitor for secondary prevention of cardiovascular events. Consider addition of alirocumab (Praluent) 75 mg subcutaneously every 2 weeks. Patient would like to read more about this before committing. Will provide patient handout information and discuss further at next visit. Can enroll in patient assistance if patient agrees with therapy. Statin intolerance noted. No statin due to serious side effects (ex. Myalgias with at least 2 different statins)  Consider lowering dose of aspirin to 81 mg by mouth daily to reduce the risk of bleeding. Will discuss further with primary care provider.  Hypertension: Current medications: amlodipine 10 mg by mouth once daily Intolerances:  ACEi (cough), ARBs (feels bad) Taking medications as directed: yes Side effects thought to be attributed to current medication regimen: no Denies  dizziness, lightheadedness, blurred vision, and headache Home blood pressure readings (has wrist and arm cuff): 110-130/70-80s Blood pressure control is unclear. High at last office visit, but pt reports normal blood pressures when monitors outside of office. Blood pressure goal is <130/80 mmHg per 2017 AHA/ACC guidelines. Continue amlodipine 10 mg by mouth once daily If additional blood pressure lowering is needed, consider adding one of the following: thiazide (chlorthalidone 12.5 mg  by mouth daily), beta blocker (carvedilol 3.125 mg by mouth daily), or hydralazine 25 mg by mouth three time daily.   Patient Goals/Self-Care Activities Over the next 90 days, patient will:  - check glucose at least once daily, document, and provide at future appointments check blood pressure at least once daily, document, and provide at future appointments collaborate with provider on medication access solutions  Follow Up Plan: Face to Face appointment with care management team member scheduled for: 04/16/21 Next PCP appointment scheduled for:  06/07/21     Medication Assistance: Application for Jardiance  medication assistance program. in process.  Anticipated assistance start date 04/16/21.  See plan of care for additional detail.  Patient's preferred pharmacy is:  [place], [place] [address] [place] [address] Phone: [phone] Fax: [phone]  Follow Up:  Patient agrees to Care Plan and Follow-up.  Kennon Holter, PharmD Clinical Pharmacist [facility] [phone]

## 2021-04-11 NOTE — Chronic Care Management (AMB) (Signed)
  Chronic Care Management   Note  04/11/2021 Name: Elizabeth [name] MRN: [id number] DOB: 02/21/1941  [name] is a 80 y.o. year old female who is a primary care patient of Noreene Larsson, NP. I reached out to [facility] by phone today in response to a referral sent by Ms. [name]'s PCP, Noreene Larsson, NP      [name] was given information about Chronic Care Management services today including:  CCM service includes personalized support from designated clinical staff supervised by her physician, including individualized plan of care and coordination with other care providers 24/7 contact phone numbers for assistance for urgent and routine care needs. Service will only be billed when office clinical staff spend 20 minutes or more in a month to coordinate care. Only one practitioner may furnish and bill the service in a calendar month. The patient may stop CCM services at any time (effective at the end of the month) by phone call to the office staff. The patient will be responsible for cost sharing (co-pay) of up to 20% of the service fee (after annual deductible is met).  Patient agreed to services and verbal consent obtained.   Follow up plan: Telephone appointment with care management team member scheduled for:04/11/21  Desia Saban  Care Guide, Embedded Care Coordination [place]  Care Management  Direct Dial: [phone]

## 2021-04-16 ENCOUNTER — Ambulatory Visit: Payer: HMO | Admitting: Pharmacist

## 2021-04-16 ENCOUNTER — Other Ambulatory Visit: Payer: Self-pay

## 2021-04-16 VITALS — BP 151/75 | HR 84

## 2021-04-16 DIAGNOSIS — E785 Hyperlipidemia, unspecified: Secondary | ICD-10-CM

## 2021-04-16 DIAGNOSIS — E1169 Type 2 diabetes mellitus with other specified complication: Secondary | ICD-10-CM

## 2021-04-16 DIAGNOSIS — I152 Hypertension secondary to endocrine disorders: Secondary | ICD-10-CM

## 2021-04-16 DIAGNOSIS — E1159 Type 2 diabetes mellitus with other circulatory complications: Secondary | ICD-10-CM

## 2021-04-16 DIAGNOSIS — E7849 Other hyperlipidemia: Secondary | ICD-10-CM

## 2021-04-16 DIAGNOSIS — I639 Cerebral infarction, unspecified: Secondary | ICD-10-CM

## 2021-04-16 NOTE — Patient Instructions (Signed)
Visit Information  PATIENT GOALS:  Goals Addressed             This Visit's Progress    Medication Management       Patient Goals/Self-Care Activities Over the next 90 days, patient will:  Check glucose at least once daily, document, and provide at future appointments Check blood pressure at least once daily, document, and provide at future appointments Collaborate with provider on medication access solutions         The patient verbalized understanding of instructions, educational materials, and care plan provided today and declined offer to receive copy of patient instructions, educational materials, and care plan.   Telephone follow up appointment with care management team member scheduled for: 04/23/21 Next PCP appointment scheduled for: 06/07/21  Kennon Holter, PharmD Clinical Pharmacist [facility] [phone]

## 2021-04-16 NOTE — Chronic Care Management (AMB) (Addendum)
Chronic Care Management Pharmacy Note  04/16/2021 Name:  Elizabeth [name] MRN:  [id number] DOB:  [birth year]  Subjective: Elizabeth [name] is an 80 y.o. year old female who is a primary patient of Noreene Larsson, NP.  The CCM team was consulted for assistance with disease management and care coordination needs.    Engaged with patient face to face for follow up visit in response to provider referral for pharmacy case management and/or care coordination services.   Consent to Services:  The patient was given information about Chronic Care Management services, agreed to services, and gave verbal consent prior to initiation of services.  Please see initial visit note for detailed documentation.   Patient Care Team: Noreene Larsson, NP as PCP - General (Nurse Practitioner) Particia Nearing, OD (Optometry) Rogene Houston, MD as Consulting Physician (Gastroenterology) Luanne Bras, MD as Consulting Physician (Interventional Radiology) Beryle Lathe, [facility] as Pharmacist  Objective:  Lab Results  Component Value Date   CREATININE 0.57 02/14/2021   CREATININE 0.81 11/05/2020   CREATININE 0.79 10/03/2020    Lab Results  Component Value Date   HGBA1C 6.2 (H) 10/03/2020   Last diabetic Eye exam:  Lab Results  Component Value Date/Time   HMDIABEYEEXA No Retinopathy 05/04/2014 12:00 AM    Last diabetic Foot exam: No results found for: HMDIABFOOTEX      Component Value Date/Time   CHOL 218 (H) 10/03/2020 0838   CHOL 242 (H) 08/09/2013 0828   TRIG 105 10/03/2020 0838   TRIG 180 (H) 09/06/2015 0947   TRIG 92 08/09/2013 0828   HDL 45 10/03/2020 0838   HDL 43 09/06/2015 0947   HDL 53 08/09/2013 0828   CHOLHDL 6.5 (H) 03/30/2019 0943   CHOLHDL 7.0 01/21/2017 1015   VLDL 27 01/21/2017 1015   LDLCALC 154 (H) 10/03/2020 0838   LDLCALC 230 (H) 02/24/2014 0837   LDLCALC 171 (H) 08/09/2013 0828    Hepatic Function Latest Ref Rng & Units 02/14/2021 11/05/2020 10/03/2020  Total  Protein 6.5 - 8.1 g/dL 7.3 7.2 7.1  Albumin 3.5 - 5.0 g/dL 4.1 4.4 4.4  AST 15 - 41 U/L '27 27 28  ' ALT 0 - 44 U/L '28 23 22  ' Alk Phosphatase 38 - 126 U/L 98 146(H) 138(H)  Total Bilirubin 0.3 - 1.2 mg/dL 1.3(H) 0.4 0.5  Bilirubin, Direct 0.0 - 0.2 mg/dL - - -    Lab Results  Component Value Date/Time   TSH 1.790 10/03/2020 08:38 AM   TSH 1.850 03/30/2019 09:43 AM   FREET4 1.24 10/03/2020 08:38 AM   FREET4 1.2 10/26/2008 10:16 AM    CBC Latest Ref Rng & Units 02/14/2021 11/05/2020 10/03/2020  WBC 4.0 - 10.5 K/uL 5.5 5.3 6.0  Hemoglobin 12.0 - 15.0 g/dL 15.8(H) 13.0 10.3(L)  Hematocrit 36.0 - 46.0 % 47.3(H) 41.3 33.8(L)  Platelets 150 - 400 K/uL 199 252 331    Lab Results  Component Value Date/Time   VD25OH 47.5 11/10/2018 11:48 AM   VD25OH 30.6 06/29/2018 12:06 PM    Clinical ASCVD: Yes  The ASCVD Risk score Mikey Bussing DC Jr., et al., 2013) failed to calculate for the following reasons:   The patient has a prior MI or stroke diagnosis    Social History   Tobacco Use  Smoking Status Former   Packs/day: 1.00   Years: 20.00   Pack years: 20.00   Types: Cigarettes   Start date: 01/15/1983   Quit date: 11/09/2020   Years since  quitting: 0.4  Smokeless Tobacco Never   BP Readings from Last 3 Encounters:  04/16/21 (!) 151/75  02/20/21 (!) 152/67  02/05/21 117/78   Pulse Readings from Last 3 Encounters:  04/16/21 84  02/20/21 89  02/05/21 92   Wt Readings from Last 3 Encounters:  02/20/21 151 lb (68.5 kg)  02/05/21 151 lb (68.5 kg)  12/25/20 152 lb 1.6 oz (69 kg)    Assessment: Review of patient past medical history, allergies, medications, health status, including review of consultants reports, laboratory and other test data, was performed as part of comprehensive evaluation and provision of chronic care management services.    CCM Care Plan  Allergies  Allergen Reactions   Banana Anaphylaxis   Codeine Nausea And Vomiting and Other (See Comments)    PROJECTILE  VOMITING   Stevia [Stevioside] Swelling and Other (See Comments)    Face, tongue, and eye swelling    Actos [Pioglitazone] Swelling   Benicar [Olmesartan Medoxomil] Other (See Comments)    Feel bad   Chantix [Varenicline] Other (See Comments)    "Worked opposite."   Diovan [Valsartan] Other (See Comments)    Feel bad   Lipitor [Atorvastatin] Other (See Comments)    REACTION:  Joint pain   Spinach Other (See Comments)    Stomach problems   Vicodin [Hydrocodone-Acetaminophen] Nausea And Vomiting   Zocor [Simvastatin] Other (See Comments)    REACTION:  "made liver function incorrectly"   Ace Inhibitors Cough   Celebrex [Celecoxib] Rash   Latex Rash and Other (See Comments)    REACTION: red rash   Metformin And Related     GI - upset    Penicillins Rash and Other (See Comments)    Has patient had a PCN reaction causing immediate rash, facial/tongue/throat swelling, SOB or lightheadedness with hypotension: No Has patient had a PCN reaction causing severe rash involving mucus membranes or skin necrosis: No Has patient had a PCN reaction that required hospitalization: No- MD office Has patient had a PCN reaction occurring within the last 10 years: No If all of the above answers are "NO", then may proceed with Cephalosporin use.    Sulfonamide Derivatives Rash    Medications Reviewed Today     Reviewed by Beryle Lathe, [facility] (Pharmacist) on 04/16/21 at 0847  Med List Status: <None>   Medication Order Taking? Sig Documenting Provider Last Dose Status Informant  albuterol (VENTOLIN HFA) 108 (90 Base) MCG/ACT inhaler [id number] Yes INHALE 2 PUFFS EVERY SIX HOURS AS NEEDED FOR WHEEZING OR SHORTNESS OF BREATH Janora Norlander, DO Taking Active            Med Note Beryle Lathe   Tue Apr 16, 2021  8:42 AM) Only takes 1-2 times per week in the Fall/Summer  amLODipine (NORVASC) 10 MG tablet [id number] Yes TAKE 1 TABLET BY MOUTH EVERY DAY Noreene Larsson, NP Taking Active    aspirin 325 MG tablet [id number] Yes Take 325 mg by mouth daily. [provider] Taking Active   Carboxymethylcellulose Sodium 1 % GEL [id number] Yes Place 1 drop into both eyes at bedtime. [provider] Taking Active Self  chlorpheniramine (CHLOR-TRIMETON) 4 MG tablet [id number] Yes Take 2-4 mg by mouth at bedtime as needed for allergies. [provider] Taking Active Self  CINNAMON PO [id number] Yes Take 1,000 mg by mouth daily as needed (for blood sugars).  [provider] Taking Active Self  clopidogrel (PLAVIX) 75 MG tablet [id number] Yes TAKE ONE TABLET BY  MOUTH DAILY (PLEASE USE AUROBINDO BRAND, PT HD FOR ALLERGY TO REDDY). Hold for 5 days Barton Dubois, MD Taking Active   JARDIANCE 10 MG TABS tablet [id number] Yes TAKE 1 TABLET BY MOUTH ONCE DAILY (TO replace farxiga) [provider] Taking Active   pantoprazole (PROTONIX) 40 MG tablet [id number] Yes TAKE 1 TABLET BY MOUTH DAILY BEFORE BREAKFAST Rehman, Mechele Dawley, MD Taking Active     Discontinued 11/14/11 1453 (Error)   sodium chloride (OCEAN) 0.65 % SOLN nasal spray [id number] Yes Place 1 spray into both nostrils as needed for congestion. [provider] Taking Active Self  ursodiol (ACTIGALL) 300 MG capsule [id number] Yes Take 1 capsule (300 mg total) by mouth 3 (three) times daily. 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   History of iron deficiency anemia 12/22/2019   IDA (iron deficiency anemia) 07/04/2019   Hepatic fibrosis 07/04/2019   Abnormal transaminases 07/04/2019   Hyperlipidemia associated with type 2 diabetes mellitus ([place]) 03/25/2019   Rectal bleeding    Iron deficiency anemia 08/11/2018   Esophageal dysphagia 07/28/2018   Stroke ([place])    Elevated liver function tests  10/22/2017   Anxiety and depression 10/21/2017   Abdominal aortic atherosclerosis ([place]) 10/21/2017   Type 2 diabetes mellitus with hyperlipidemia ([place]) 09/06/2015   Type 2 diabetes mellitus with peripheral neuropathy ([place]) 11/27/2014   CVA (cerebral vascular accident) ([place]) 02/27/2012   Cerebral aneurysm 02/27/2012   Tobacco abuse 02/27/2012   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 02/27/2012   Gait instability 02/27/2012   Vision disturbance following cerebrovascular accident 02/27/2012   Dyspnea 11/14/2011   Thyroid disease 10/12/2008   Diabetes ([place]) 08/29/2008   Hyperlipidemia 08/29/2008   Obesity, unspecified 08/29/2008   COPD (chronic obstructive pulmonary disease) ([place]) 08/29/2008   WEIGHT LOSS 08/29/2008   DIARRHEA 08/29/2008   Hypertension associated with diabetes ([place]) 08/28/2008   ASTHMA 08/28/2008   ESOPHAGEAL STRICTURE 08/28/2008   GERD (gastroesophageal reflux disease) 08/28/2008   HIATAL HERNIA 08/28/2008   Diverticulosis of colon 08/28/2008   COLONIC POLYPS, HX OF 08/28/2008    Immunization History  Administered Date(s) Administered   Influenza Whole 06/08/2010   Influenza, High Dose Seasonal PF 06/17/2019   Influenza,inj,Quad PF,6+ Mos 07/07/2013, 07/06/2014, 07/17/2017   Influenza-Unspecified 08/05/2016   Pneumococcal Conjugate-13 11/25/2013   Pneumococcal Polysaccharide-23 09/09/2007    Conditions to be addressed/monitored: CAD, HTN, HLD, and DMII  Care Plan : Medication Management  Updates made by Beryle Lathe, [place] since 04/16/2021 12:00 AM     Problem: Diabetes, Hyperlipidemia/History of Stroke, Hypertension   Priority: High  Onset Date: 04/11/2021     Long-Range Goal: Disease Progression Prevention   Start Date: 04/11/2021  Expected End Date: 07/10/2021  Recent Progress: On track  Priority: High  Note:   Current Barriers:  Unable to independently afford treatment regimen Unable to achieve control of hyperlipidemia  Suboptimal therapeutic regimen  for hyperlipidemia  Pharmacist Clinical Goal(s):  Over the next 90 days, patient will verbalize ability to afford treatment regimen achieve control of hyperlipidemia as evidenced by lower LDL 4-12 weeks after initiation new LDL lowering therapy adhere to plan to optimize therapeutic regimen for hyperlipidemia as evidenced by report of adherence to recommended medication management changes contact provider office for questions/concerns as  evidenced notation of same in electronic health record through collaboration with PharmD and provider.   Interventions: 1:1 collaboration with Noreene Larsson, NP regarding development and update of comprehensive plan of care as evidenced by provider attestation and co-signature Inter-disciplinary care team collaboration (see longitudinal plan of care) Comprehensive medication review performed; medication list updated in electronic medical record  Diabetes: Current medications: empagliflozin 10 mg by mouth once daily Intolerances:  pioglitazone (swelling) , metformin (GI upset) Taking medications as directed: yes Side effects thought to be attributed to current medication regimen: no Denies hypoglycemic/hyperglycemic symptoms Patient signed [place] Patient Assistance Program today. Faxed to [facility] for patient to receive Jardiance from the drug manufacturer for free shipped directly to her house. Patient given [employer] phone number to call for refills [phone]) Controlled; Most recent A1c at goal of <7% per ADA guidelines Continue empagliflozin 10 mg by mouth once daily Check A1c, fasting lipid panel, and BMP at next PCP visit  History of Stroke/Hyperlipidemia: Current medications: clopidogrel 75 mg by mouth daily, aspirin 325 mg by mouth daily  Patient reports that she was previously taking aspirin 81 mg daily but a doctor told her to increase it to 325 mg daily  Patient reports that she quit smoking in March 2022 Intolerances:   multiple statins (joint pain and liver function tests elevation 2X ULN) and ezetimibe Taking medications as directed: yes Side effects thought to be attributed to current medication regimen: no Uncontrolled; LDL above goal of <55 due to extreme risk given established clinical ASCVD + diabetes per 2020 AACE/ACE guidelines and TG at goal of <150 per 2020 AACE/ACE guidelines Extensively discussed secondary prevention of ASCVD and correlation of events with LDL. Discussed the benefits of PCSK9i therapy along with possible side effects. Patient was given a patient education handout to read as she is apprehensive of starting a new medication without reading about it first.  If patient agrees to start PCSK9i, consider addition of alirocumab (Praluent) 75 mg subcutaneously every 2 weeks. Can enroll in patient assistance. Statin intolerance noted. No statin due to serious side effects (ex. Myalgias with at least 2 different statins)  Unsure of the benefit of aspirin 325 mg daily vs the risk of bleeding. Will ask PCP to further assess aspirin 325 mg vs 81 mg at next PCP visit and consider referral to Neurology if unable to determine by primary care.   Hypertension: Current medications: amlodipine 10 mg by mouth once daily Intolerances:  ACEi (cough), ARBs (feels bad) Taking medications as directed: yes Side effects thought to be attributed to current medication regimen: no Denies dizziness, lightheadedness, blurred vision, and headache Home blood pressure readings per patient report this morning: 127/68 Blood pressure control is unclear. Blood pressure is high at office visits, but patient reports normal blood pressures when monitors outside of office. Blood pressure goal is <130/80 mmHg per 2017 AHA/ACC guidelines. Continue amlodipine 10 mg by mouth once daily If additional blood pressure lowering is needed, consider adding one of the following: thiazide (chlorthalidone 12.5 mg by mouth daily), beta  blocker (carvedilol 3.125 mg by mouth daily), or hydralazine 25 mg by mouth three time daily.   Patient Goals/Self-Care Activities Over the next 90 days, patient will:  - check glucose at least once daily, document, and provide at future appointments check blood pressure at least once daily, document, and provide at future appointments collaborate with provider on medication access solutions  Follow Up Plan: Telephone appointment with care management team member  scheduled for: 04/23/21 Next PCP appointment scheduled for: 06/07/21     Medication Assistance: Application for Jardiance  medication assistance program. in process.  Anticipated assistance start date 04/30/21.  See plan of care for additional detail.  Patient's preferred pharmacy is:  [facility] [place] [address] [place] [address] Phone: [phone] Fax: [phone]  Follow Up:  Patient agrees to Care Plan and Follow-up.  Plan: Telephone follow up appointment with care management team member scheduled for:  04/23/21 and Next PCP appointment scheduled for: 06/07/21  Kennon Holter, PharmD Clinical Pharmacist [facility] [phone]

## 2021-04-18 ENCOUNTER — Ambulatory Visit: Payer: HMO | Admitting: Pharmacist

## 2021-04-18 DIAGNOSIS — E1159 Type 2 diabetes mellitus with other circulatory complications: Secondary | ICD-10-CM

## 2021-04-18 DIAGNOSIS — I639 Cerebral infarction, unspecified: Secondary | ICD-10-CM

## 2021-04-18 DIAGNOSIS — E1169 Type 2 diabetes mellitus with other specified complication: Secondary | ICD-10-CM

## 2021-04-18 DIAGNOSIS — E7849 Other hyperlipidemia: Secondary | ICD-10-CM

## 2021-04-18 NOTE — Chronic Care Management (AMB) (Signed)
Chronic Care Management Pharmacy Note  04/18/2021 Name:  Elizabeth [name] MRN:  [id number] DOB:  1941-08-26  Subjective: Elizabeth [name] is an 80 y.o. year old female who is a primary patient of Noreene Larsson, NP.  The CCM team was consulted for assistance with disease management and care coordination needs.    Engaged with patient by telephone for follow up visit in response to provider referral for pharmacy case management and/or care coordination services.   Consent to Services:  The patient was given information about Chronic Care Management services, agreed to services, and gave verbal consent prior to initiation of services.  Please see initial visit note for detailed documentation.   Patient Care Team: Noreene Larsson, NP as PCP - General (Nurse Practitioner) Particia Nearing, OD (Optometry) Rogene Houston, MD as Consulting Physician (Gastroenterology) Luanne Bras, MD as Consulting Physician (Interventional Radiology) Beryle Lathe, [facility] [employer] as Pharmacist  Objective:  Lab Results  Component Value Date   CREATININE 0.57 02/14/2021   CREATININE 0.81 11/05/2020   CREATININE 0.79 10/03/2020    Lab Results  Component Value Date   HGBA1C 6.2 (H) 10/03/2020   Last diabetic Eye exam:  Lab Results  Component Value Date/Time   HMDIABEYEEXA No Retinopathy 05/04/2014 12:00 AM    Last diabetic Foot exam: No results found for: HMDIABFOOTEX      Component Value Date/Time   CHOL 218 (H) 10/03/2020 0838   CHOL 242 (H) 08/09/2013 0828   TRIG 105 10/03/2020 0838   TRIG 180 (H) 09/06/2015 0947   TRIG 92 08/09/2013 0828   HDL 45 10/03/2020 0838   HDL 43 09/06/2015 0947   HDL 53 08/09/2013 0828   CHOLHDL 6.5 (H) 03/30/2019 0943   CHOLHDL 7.0 01/21/2017 1015   VLDL 27 01/21/2017 1015   LDLCALC 154 (H) 10/03/2020 0838   LDLCALC 230 (H) 02/24/2014 0837   LDLCALC 171 (H) 08/09/2013 0828    Hepatic Function Latest Ref Rng & Units 02/14/2021 11/05/2020 10/03/2020  Total  Protein 6.5 - 8.1 g/dL 7.3 7.2 7.1  Albumin 3.5 - 5.0 g/dL 4.1 4.4 4.4  AST 15 - 41 U/L _0 ALT 0 - 44 U/L _1 Alk Phosphatase 38 - 126 U/L 98 146(H) 138(H)  Total Bilirubin 0.3 - 1.2 mg/dL 1.3(H) 0.4 0.5  Bilirubin, Direct 0.0 - 0.2 mg/dL - - -    Lab Results  Component Value Date/Time   TSH 1.790 10/03/2020 08:38 AM   TSH 1.850 03/30/2019 09:43 AM   FREET4 1.24 10/03/2020 08:38 AM   FREET4 1.2 10/26/2008 10:16 AM    CBC Latest Ref Rng & Units 02/14/2021 11/05/2020 10/03/2020  WBC 4.0 - 10.5 K/uL 5.5 5.3 6.0  Hemoglobin 12.0 - 15.0 g/dL 15.8(H) 13.0 10.3(L)  Hematocrit 36.0 - 46.0 % 47.3(H) 41.3 33.8(L)  Platelets 150 - 400 K/uL 199 252 331    Lab Results  Component Value Date/Time   VD25OH 47.5 11/10/2018 11:48 AM   VD25OH 30.6 06/29/2018 12:06 PM    Clinical ASCVD: Yes  The ASCVD Risk score Mikey Bussing DC Jr., et al., 2013) failed to calculate for the following reasons:   The patient has a prior MI or stroke diagnosis    Social History   Tobacco Use  Smoking Status Former   Packs/day: 1.00   Years: 20.00   Pack years: 20.00   Types: Cigarettes   Start date: 01/15/1983   Quit date: 11/09/2020   Years since quitting:  0.4  Smokeless Tobacco Never   BP Readings from Last 3 Encounters:  04/16/21 (!) 151/75  02/20/21 (!) 152/67  02/05/21 117/78   Pulse Readings from Last 3 Encounters:  04/16/21 84  02/20/21 89  02/05/21 92   Wt Readings from Last 3 Encounters:  02/20/21 151 lb (68.5 kg)  02/05/21 151 lb (68.5 kg)  12/25/20 152 lb 1.6 oz (69 kg)    Assessment: Review of patient past medical history, allergies, medications, health status, including review of consultants reports, laboratory and other test data, was performed as part of comprehensive evaluation and provision of chronic care management services.   SDOH:  (Social Determinants of Health) assessments and interventions performed:    CCM Care Plan  Allergies  Allergen Reactions   Banana  Anaphylaxis   Codeine Nausea And Vomiting and Other (See Comments)    PROJECTILE VOMITING   Stevia [Stevioside] Swelling and Other (See Comments)    Face, tongue, and eye swelling    Actos [Pioglitazone] Swelling   Benicar [Olmesartan Medoxomil] Other (See Comments)    Feel bad   Chantix [Varenicline] Other (See Comments)    "Worked opposite."   Diovan [Valsartan] Other (See Comments)    Feel bad   Lipitor [Atorvastatin] Other (See Comments)    REACTION:  Joint pain   Spinach Other (See Comments)    Stomach problems   Vicodin [Hydrocodone-Acetaminophen] Nausea And Vomiting   Zocor [Simvastatin] Other (See Comments)    REACTION:  "made liver function incorrectly"   Ace Inhibitors Cough   Celebrex [Celecoxib] Rash   Latex Rash and Other (See Comments)    REACTION: red rash   Metformin And Related     GI - upset    Penicillins Rash and Other (See Comments)    Has patient had a PCN reaction causing immediate rash, facial/tongue/throat swelling, SOB or lightheadedness with hypotension: No Has patient had a PCN reaction causing severe rash involving mucus membranes or skin necrosis: No Has patient had a PCN reaction that required hospitalization: No- MD office Has patient had a PCN reaction occurring within the last 10 years: No If all of the above answers are "NO", then may proceed with Cephalosporin use.    Sulfonamide Derivatives Rash    Medications Reviewed Today     Reviewed by Beryle Lathe, [facility] (Pharmacist) on 04/18/21 at 0909  Med List Status: <None>   Medication Order Taking? Sig Documenting Provider Last Dose Status Informant  albuterol (VENTOLIN HFA) 108 (90 Base) MCG/ACT inhaler [id number] Yes INHALE 2 PUFFS EVERY SIX HOURS AS NEEDED FOR WHEEZING OR SHORTNESS OF BREATH Janora Norlander, DO Taking Active            Med Note Beryle Lathe   Tue Apr 16, 2021  8:42 AM) Only takes 1-2 times per week in the Fall/Summer  amLODipine (NORVASC) 10 MG tablet  [id number] Yes TAKE 1 TABLET BY MOUTH EVERY DAY Noreene Larsson, NP Taking Active   aspirin 325 MG tablet [id number] Yes Take 325 mg by mouth daily. [provider] Taking Active   Carboxymethylcellulose Sodium 1 % GEL [id number] Yes Place 1 drop into both eyes at bedtime. [provider] Taking Active Self  chlorpheniramine (CHLOR-TRIMETON) 4 MG tablet [id number] Yes Take 2-4 mg by mouth at bedtime as needed for allergies. [provider] Taking Active Self  CINNAMON PO [id number] Yes Take 1,000 mg by mouth daily as needed (for blood sugars).  [provider] Taking Active Self  clopidogrel (PLAVIX) 75 MG tablet [id number] Yes TAKE ONE TABLET BY MOUTH DAILY (PLEASE USE AUROBINDO BRAND, PT HD FOR ALLERGY TO REDDY). Hold for 5 days Barton Dubois, MD Taking Active   JARDIANCE 10 MG TABS tablet [id number] Yes TAKE 1 TABLET BY MOUTH ONCE DAILY (TO replace farxiga) [provider] Taking Active   pantoprazole (PROTONIX) 40 MG tablet [id number] Yes TAKE 1 TABLET BY MOUTH DAILY BEFORE BREAKFAST Rehman, Mechele Dawley, MD Taking Active     Discontinued 11/14/11 1453 (Error)   sodium chloride (OCEAN) 0.65 % SOLN nasal spray [id number] Yes Place 1 spray into both nostrils as needed for congestion. [provider] Taking Active Self  ursodiol (ACTIGALL) 300 MG capsule [id number] Yes Take 1 capsule (300 mg total) by mouth 3 (three) times daily. 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   History of iron deficiency anemia 12/22/2019   IDA (iron deficiency anemia) 07/04/2019   Hepatic fibrosis 07/04/2019   Abnormal transaminases 07/04/2019   Hyperlipidemia associated with type 2 diabetes mellitus ([place]) 03/25/2019   Rectal bleeding    Iron deficiency anemia 08/11/2018    Esophageal dysphagia 07/28/2018   Stroke ([place])    Elevated liver function tests 10/22/2017   Anxiety and depression 10/21/2017   Abdominal aortic atherosclerosis ([place]) 10/21/2017   Type 2 diabetes mellitus with hyperlipidemia ([place]) 09/06/2015   Type 2 diabetes mellitus with peripheral neuropathy ([place]) 11/27/2014   CVA (cerebral vascular accident) ([place]) 02/27/2012   Cerebral aneurysm 02/27/2012   Tobacco abuse 02/27/2012   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 02/27/2012   Gait instability 02/27/2012   Vision disturbance following cerebrovascular accident 02/27/2012   Dyspnea 11/14/2011   Thyroid disease 10/12/2008   Diabetes ([place]) 08/29/2008   Hyperlipidemia 08/29/2008   Obesity, unspecified 08/29/2008   COPD (chronic obstructive pulmonary disease) ([place]) 08/29/2008   WEIGHT LOSS 08/29/2008   DIARRHEA 08/29/2008   Hypertension associated with diabetes ([place]) 08/28/2008   ASTHMA 08/28/2008   ESOPHAGEAL STRICTURE 08/28/2008   GERD (gastroesophageal reflux disease) 08/28/2008   HIATAL HERNIA 08/28/2008   Diverticulosis of colon 08/28/2008   COLONIC POLYPS, HX OF 08/28/2008    Immunization History  Administered Date(s) Administered   Influenza Whole 06/08/2010   Influenza, High Dose Seasonal PF 06/17/2019   Influenza,inj,Quad PF,6+ Mos 07/07/2013, 07/06/2014, 07/17/2017   Influenza-Unspecified 08/05/2016   Pneumococcal Conjugate-13 11/25/2013   Pneumococcal Polysaccharide-23 09/09/2007    Conditions to be addressed/monitored: CAD, HTN, HLD, and DMII  Care Plan : Medication Management  Updates made by Beryle Lathe, [place] since 04/18/2021 12:00 AM     Problem: Diabetes, Hyperlipidemia/History of Stroke, Hypertension   Priority: High  Onset Date: 04/11/2021     Long-Range Goal: Disease Progression Prevention   Start Date: 04/11/2021  Expected End Date: 07/10/2021  Recent Progress: On track  Priority: High  Note:   Current Barriers:  Unable to independently afford treatment  regimen Unable to achieve control of hyperlipidemia  Suboptimal therapeutic regimen for hyperlipidemia  Pharmacist Clinical Goal(s):  Over the next 90 days, patient will verbalize ability to afford treatment regimen achieve control of hyperlipidemia as evidenced by lower LDL 4-12 weeks after initiation new LDL lowering therapy adhere to plan to optimize therapeutic regimen for hyperlipidemia as evidenced by report of adherence  to recommended medication management changes contact provider office for questions/concerns as evidenced notation of same in electronic health record through collaboration with PharmD and provider.   Interventions: 1:1 collaboration with Noreene Larsson, NP regarding development and update of comprehensive plan of care as evidenced by provider attestation and co-signature Inter-disciplinary care team collaboration (see longitudinal plan of care) Comprehensive medication review performed; medication list updated in electronic medical record  Diabetes: Current medications: empagliflozin (Jardiance) 10 mg by mouth once daily Intolerances:  pioglitazone (swelling) , metformin (GI upset) Taking medications as directed: yes Side effects thought to be attributed to current medication regimen: no Denies hypoglycemic/hyperglycemic symptoms Spoke with [employer] team today and application for Jardiance was approved from 04/18/21 - 09/07/21 and patient should receive receive 90 day supply within 5 business days. Will plan to submit a renewal application in November/December. Patient given [employer] phone number to call for refills [phone]).  Controlled; Most recent A1c at goal of <7% per ADA guidelines Continue empagliflozin 10 mg by mouth once daily Check A1c, fasting lipid panel, and BMP at next PCP visit  History of Stroke/Hyperlipidemia: Current medications: clopidogrel 75 mg by mouth daily, aspirin 325 mg by mouth daily  Patient reports that she was previously taking  aspirin 81 mg daily but a doctor told her to increase it to 325 mg daily  Patient reports that she quit smoking in March 2022 Intolerances:  multiple statins (joint pain and liver function tests elevation 2X ULN) and ezetimibe Taking medications as directed: yes Side effects thought to be attributed to current medication regimen: no Uncontrolled; LDL above goal of <55 due to extreme risk given established clinical ASCVD + diabetes per 2020 AACE/ACE guidelines and TG at goal of <150 per 2020 AACE/ACE guidelines Patient reports reading patient education material given at last visit and has concerns with potential side effects as she has had many allergies to things in the past. After extensive discussion regarding secondary prevention of ASCVD and correlation of events with LDL, patient declines all cholesterol lowering medications stating "they just don't work for me". She is aware and accepts the increased risk of further cardiovascular events given her elevated cholesterol level.  Statin intolerance noted. No statin due to serious side effects (ex. Myalgias with at least 2 different statins)  Unsure of the benefit of aspirin 325 mg daily vs the risk of bleeding. Will ask PCP to further assess aspirin 325 mg vs 81 mg at next PCP visit and consider referral to Neurology if unable to determine by primary care.   Hypertension: Current medications: amlodipine 10 mg by mouth once daily Intolerances:  ACEi (cough), ARBs (feels bad) Taking medications as directed: yes Side effects thought to be attributed to current medication regimen: no Denies dizziness, lightheadedness, blurred vision, and headache Home blood pressure readings per patient report this morning: 127/68 Blood pressure control is unclear. Blood pressure is high at office visits, but patient reports normal blood pressures when monitors outside of office. Blood pressure goal is <130/80 mmHg per 2017 AHA/ACC guidelines. Continue amlodipine 10  mg by mouth once daily If additional blood pressure lowering is needed, consider adding one of the following: thiazide (chlorthalidone 12.5 mg by mouth daily), beta blocker (carvedilol 3.125 mg by mouth daily), or hydralazine 25 mg by mouth three time daily.   Patient Goals/Self-Care Activities Over the next 90 days, patient will:  - check glucose at least once daily, document, and provide at future appointments check blood pressure at least once  daily, document, and provide at future appointments collaborate with provider on medication access solutions  Follow Up Plan: Patient reports that she would like to call me back to discuss further chronic care management in a couple weeks. Next PCP appointment scheduled for: 06/07/21     Medication Assistance:  Jardiance obtained through [place] medication assistance program.  Enrollment ends 09/07/21  Patient's preferred pharmacy is:  [facility] [place] [address] [place] [address] Phone: [phone] Fax: [phone]  Follow Up:  Patient agrees to Care Plan and Follow-up.  Plan: The patient has been provided with contact information for the care management team and has been advised to call with any health related questions or concerns.  and Next PCP appointment scheduled for: 06/07/21  Kennon Holter, PharmD Clinical Pharmacist [facility] [phone]

## 2021-04-18 NOTE — Patient Instructions (Signed)
Visit Information  PATIENT GOALS:  Goals Addressed             This Visit's Progress    Medication Management       Patient Goals/Self-Care Activities Over the next 90 days, patient will:  Check glucose at least once daily, document, and provide at future appointments Check blood pressure at least once daily, document, and provide at future appointments Collaborate with provider on medication access solutions          The patient verbalized understanding of instructions, educational materials, and care plan provided today and declined offer to receive copy of patient instructions, educational materials, and care plan.   The patient has been provided with contact information for the care management team and has been advised to call with any health related questions or concerns.  Next PCP appointment scheduled for: 06/07/21  Kennon Holter, PharmD Clinical Pharmacist [facility] [phone]

## 2021-04-23 ENCOUNTER — Telehealth: Payer: HMO

## 2021-04-25 ENCOUNTER — Ambulatory Visit: Payer: HMO | Admitting: Pharmacist

## 2021-04-25 DIAGNOSIS — I639 Cerebral infarction, unspecified: Secondary | ICD-10-CM

## 2021-04-25 DIAGNOSIS — E7849 Other hyperlipidemia: Secondary | ICD-10-CM

## 2021-04-25 DIAGNOSIS — E1169 Type 2 diabetes mellitus with other specified complication: Secondary | ICD-10-CM

## 2021-04-25 DIAGNOSIS — E785 Hyperlipidemia, unspecified: Secondary | ICD-10-CM

## 2021-04-25 DIAGNOSIS — E1159 Type 2 diabetes mellitus with other circulatory complications: Secondary | ICD-10-CM

## 2021-04-25 NOTE — Patient Instructions (Signed)
Elizabeth [name],  It was great to talk to you today!  Please call me with any questions or concerns.   Visit Information  PATIENT GOALS:  Goals Addressed             This Visit's Progress    Medication Management       Patient Goals/Self-Care Activities Over the next 90 days, patient will:  Check glucose at least once daily, document, and provide at future appointments Check blood pressure at least once daily, document, and provide at future appointments Collaborate with provider on medication access solutions           The patient verbalized understanding of instructions, educational materials, and care plan provided today and declined offer to receive copy of patient instructions, educational materials, and care plan.   Next PCP appointment scheduled for: 06/07/21  Kennon Holter, PharmD Clinical Pharmacist [facility] [phone]

## 2021-04-25 NOTE — Chronic Care Management (AMB) (Signed)
Chronic Care Management Pharmacy Note  04/25/2021 Name:  Elizabeth [name] MRN:  [id number] DOB:  03/13/1941  Summary:  Patient called to confirm receipt of Jardiance from [facility]. She has been approved through 09/07/21. Will plan to submit a renewal application in November/December. Patient given [facility] phone number to call for refills [phone]).  Patient also reports recent home BG values of 90-100s fasting without hypoglycemia and recent home blood pressure readings per patient report: 130s/70s Patient continues to decline further cholesterol lowering medication; however, states that she will dicuss PCSK9 inhibitor option with her GI doctor in October and may consider therapy initiation if he approves.  Subjective: Elizabeth [name] is an 80 y.o. year old female who is a primary patient of Noreene Larsson, NP.  The CCM team was consulted for assistance with disease management and care coordination needs.    Engaged with patient by telephone for follow up visit in response to provider referral for pharmacy case management and/or care coordination services.   Consent to Services:  The patient was given information about Chronic Care Management services, agreed to services, and gave verbal consent prior to initiation of services.  Please see initial visit note for detailed documentation.   Patient Care Team: Noreene Larsson, NP as PCP - General (Nurse Practitioner) Particia Nearing, OD (Optometry) Rogene Houston, MD as Consulting Physician (Gastroenterology) Luanne Bras, MD as Consulting Physician (Interventional Radiology) Beryle Lathe, [facility] as Pharmacist  Objective:  Lab Results  Component Value Date   CREATININE 0.57 02/14/2021   CREATININE 0.81 11/05/2020   CREATININE 0.79 10/03/2020    Lab Results  Component Value Date   HGBA1C 6.2 (H) 10/03/2020   Last diabetic Eye exam:  Lab Results  Component Value Date/Time   HMDIABEYEEXA No Retinopathy 05/04/2014  12:00 AM    Last diabetic Foot exam: No results found for: HMDIABFOOTEX      Component Value Date/Time   CHOL 218 (H) 10/03/2020 0838   CHOL 242 (H) 08/09/2013 0828   TRIG 105 10/03/2020 0838   TRIG 180 (H) 09/06/2015 0947   TRIG 92 08/09/2013 0828   HDL 45 10/03/2020 0838   HDL 43 09/06/2015 0947   HDL 53 08/09/2013 0828   CHOLHDL 6.5 (H) 03/30/2019 0943   CHOLHDL 7.0 01/21/2017 1015   VLDL 27 01/21/2017 1015   LDLCALC 154 (H) 10/03/2020 0838   LDLCALC 230 (H) 02/24/2014 0837   LDLCALC 171 (H) 08/09/2013 0828    Hepatic Function Latest Ref Rng & Units 02/14/2021 11/05/2020 10/03/2020  Total Protein 6.5 - 8.1 g/dL 7.3 7.2 7.1  Albumin 3.5 - 5.0 g/dL 4.1 4.4 4.4  AST 15 - 41 U/L '27 27 28  ' ALT 0 - 44 U/L '28 23 22  ' Alk Phosphatase 38 - 126 U/L 98 146(H) 138(H)  Total Bilirubin 0.3 - 1.2 mg/dL 1.3(H) 0.4 0.5  Bilirubin, Direct 0.0 - 0.2 mg/dL - - -    Lab Results  Component Value Date/Time   TSH 1.790 10/03/2020 08:38 AM   TSH 1.850 03/30/2019 09:43 AM   FREET4 1.24 10/03/2020 08:38 AM   FREET4 1.2 10/26/2008 10:16 AM    CBC Latest Ref Rng & Units 02/14/2021 11/05/2020 10/03/2020  WBC 4.0 - 10.5 K/uL 5.5 5.3 6.0  Hemoglobin 12.0 - 15.0 g/dL 15.8(H) 13.0 10.3(L)  Hematocrit 36.0 - 46.0 % 47.3(H) 41.3 33.8(L)  Platelets 150 - 400 K/uL 199 252 331    Lab Results  Component Value Date/Time  VD25OH 47.5 11/10/2018 11:48 AM   VD25OH 30.6 06/29/2018 12:06 PM    Clinical ASCVD: Yes  The ASCVD Risk score Mikey Bussing DC Jr., et al., 2013) failed to calculate for the following reasons:   The patient has a prior MI or stroke diagnosis    Social History   Tobacco Use  Smoking Status Former   Packs/day: 1.00   Years: 20.00   Pack years: 20.00   Types: Cigarettes   Start date: 01/15/1983   Quit date: 11/09/2020   Years since quitting: 0.4  Smokeless Tobacco Never   BP Readings from Last 3 Encounters:  04/16/21 (!) 151/75  02/20/21 (!) 152/67  02/05/21 117/78   Pulse Readings  from Last 3 Encounters:  04/16/21 84  02/20/21 89  02/05/21 92   Wt Readings from Last 3 Encounters:  02/20/21 151 lb (68.5 kg)  02/05/21 151 lb (68.5 kg)  12/25/20 152 lb 1.6 oz (69 kg)    Assessment: Review of patient past medical history, allergies, medications, health status, including review of consultants reports, laboratory and other test data, was performed as part of comprehensive evaluation and provision of chronic care management services.   SDOH:  (Social Determinants of Health) assessments and interventions performed:    CCM Care Plan  Allergies  Allergen Reactions   Banana Anaphylaxis   Codeine Nausea And Vomiting and Other (See Comments)    PROJECTILE VOMITING   Stevia [Stevioside] Swelling and Other (See Comments)    Face, tongue, and eye swelling    Actos [Pioglitazone] Swelling   Benicar [Olmesartan Medoxomil] Other (See Comments)    Feel bad   Chantix [Varenicline] Other (See Comments)    "Worked opposite."   Diovan [Valsartan] Other (See Comments)    Feel bad   Lipitor [Atorvastatin] Other (See Comments)    REACTION:  Joint pain   Spinach Other (See Comments)    Stomach problems   Vicodin [Hydrocodone-Acetaminophen] Nausea And Vomiting   Zocor [Simvastatin] Other (See Comments)    REACTION:  "made liver function incorrectly"   Ace Inhibitors Cough   Celebrex [Celecoxib] Rash   Latex Rash and Other (See Comments)    REACTION: red rash   Metformin And Related     GI - upset    Penicillins Rash and Other (See Comments)    Has patient had a PCN reaction causing immediate rash, facial/tongue/throat swelling, SOB or lightheadedness with hypotension: No Has patient had a PCN reaction causing severe rash involving mucus membranes or skin necrosis: No Has patient had a PCN reaction that required hospitalization: No- MD office Has patient had a PCN reaction occurring within the last 10 years: No If all of the above answers are "NO", then may proceed with  Cephalosporin use.    Sulfonamide Derivatives Rash    Medications Reviewed Today     Reviewed by Beryle Lathe, [facility] (Pharmacist) on 04/25/21 at 1617  Med List Status: <None>   Medication Order Taking? Sig Documenting Provider Last Dose Status Informant  albuterol (VENTOLIN HFA) 108 (90 Base) MCG/ACT inhaler [id number] Yes INHALE 2 PUFFS EVERY SIX HOURS AS NEEDED FOR WHEEZING OR SHORTNESS OF BREATH Janora Norlander, DO Taking Active            Med Note Beryle Lathe   Tue Apr 16, 2021  8:42 AM) Only takes 1-2 times per week in the Fall/Summer  amLODipine (NORVASC) 10 MG tablet [id number] Yes TAKE 1 TABLET BY MOUTH EVERY DAY Noreene Larsson, NP Taking  Active   aspirin 325 MG tablet [id number] Yes Take 325 mg by mouth daily. [provider] Taking Active   Carboxymethylcellulose Sodium 1 % GEL [id number]  Place 1 drop into both eyes at bedtime. [provider]  Active Self  chlorpheniramine (CHLOR-TRIMETON) 4 MG tablet [id number] Yes Take 2-4 mg by mouth at bedtime as needed for allergies. [provider] Taking Active Self  CINNAMON PO [id number] Yes Take 1,000 mg by mouth daily as needed (for blood sugars).  [provider] Taking Active Self  clindamycin (CLEOCIN) 150 MG capsule [id number] Yes Take 150 mg by mouth 4 (four) times daily. [provider] Taking Active   clopidogrel (PLAVIX) 75 MG tablet [id number] Yes TAKE ONE TABLET BY MOUTH DAILY (PLEASE USE AUROBINDO BRAND, PT HD FOR ALLERGY TO REDDY). Hold for 5 days Barton Dubois, MD Taking Active   JARDIANCE 10 MG TABS tablet [id number] Yes TAKE 1 TABLET BY MOUTH ONCE DAILY (TO replace farxiga) [provider] Taking Active            Med Note Jim Like Apr 25, 2021  4:17 PM) Frances Maywood from [place] Patient Assistance [facility]  pantoprazole (PROTONIX) 40 MG tablet [id number] Yes TAKE 1 TABLET BY MOUTH DAILY BEFORE BREAKFAST Rogene Houston, MD Taking Active      Discontinued 11/14/11 1453 (Error)   sodium chloride (OCEAN) 0.65 % SOLN nasal spray [id number] Yes Place 1 spray into both nostrils as needed for congestion. [provider] Taking Active Self  ursodiol (ACTIGALL) 300 MG capsule [id number] Yes Take 1 capsule (300 mg total) by mouth 3 (three) times daily. 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   History of iron deficiency anemia 12/22/2019   IDA (iron deficiency anemia) 07/04/2019   Hepatic fibrosis 07/04/2019   Abnormal transaminases 07/04/2019   Hyperlipidemia associated with type 2 diabetes mellitus ([place]) 03/25/2019   Rectal bleeding    Iron deficiency anemia 08/11/2018   Esophageal dysphagia 07/28/2018   Stroke ([place])    Elevated liver function tests 10/22/2017   Anxiety and depression 10/21/2017   Abdominal aortic atherosclerosis ([place]) 10/21/2017   Type 2 diabetes mellitus with hyperlipidemia ([place]) 09/06/2015   Type 2 diabetes mellitus with peripheral neuropathy ([place]) 11/27/2014   CVA (cerebral vascular accident) ([place]) 02/27/2012   Cerebral aneurysm 02/27/2012   Tobacco abuse 02/27/2012   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 02/27/2012   Gait instability 02/27/2012   Vision disturbance following cerebrovascular accident 02/27/2012   Dyspnea 11/14/2011   Thyroid disease 10/12/2008   Diabetes ([place]) 08/29/2008   Hyperlipidemia 08/29/2008   Obesity, unspecified 08/29/2008   COPD (chronic obstructive pulmonary disease) ([place]) 08/29/2008   WEIGHT LOSS 08/29/2008   DIARRHEA 08/29/2008   Hypertension associated with diabetes ([place]) 08/28/2008   ASTHMA 08/28/2008   ESOPHAGEAL STRICTURE 08/28/2008   GERD (gastroesophageal reflux disease) 08/28/2008   HIATAL HERNIA 08/28/2008   Diverticulosis of colon 08/28/2008   COLONIC POLYPS,  HX OF 08/28/2008    Immunization History  Administered Date(s) Administered   Influenza Whole 06/08/2010   Influenza, High Dose Seasonal PF 06/17/2019   Influenza,inj,Quad PF,6+ Mos 07/07/2013, 07/06/2014, 07/17/2017   Influenza-Unspecified 08/05/2016   Pneumococcal Conjugate-13 11/25/2013   Pneumococcal Polysaccharide-23 09/09/2007    Conditions  to be addressed/monitored: CAD, HTN, HLD, and DMII  Care Plan : Medication Management  Updates made by Beryle Lathe, [place] since 04/25/2021 12:00 AM     Problem: Diabetes, Hyperlipidemia/History of Stroke, Hypertension   Priority: High  Onset Date: 04/11/2021     Long-Range Goal: Disease Progression Prevention   Start Date: 04/11/2021  Expected End Date: 07/10/2021  Recent Progress: On track  Priority: High  Note:   Current Barriers:  Unable to achieve control of hyperlipidemia  Suboptimal therapeutic regimen for hyperlipidemia  Pharmacist Clinical Goal(s):  Over the next 90 days, patient will achieve control of hyperlipidemia as evidenced by lower LDL 4-12 weeks after initiation new LDL lowering therapy adhere to plan to optimize therapeutic regimen for hyperlipidemia as evidenced by report of adherence to recommended medication management changes contact provider office for questions/concerns as evidenced notation of same in electronic health record through collaboration with PharmD and provider.   Interventions: 1:1 collaboration with Noreene Larsson, NP regarding development and update of comprehensive plan of care as evidenced by provider attestation and co-signature Inter-disciplinary care team collaboration (see longitudinal plan of care) Comprehensive medication review performed; medication list updated in electronic medical record  Diabetes: Current medications: empagliflozin (Jardiance) 10 mg by mouth once daily Intolerances:  pioglitazone (swelling) , metformin (GI upset) Taking medications as directed: yes Side  effects thought to be attributed to current medication regimen: no Denies hypoglycemic/hyperglycemic symptoms Patient called to confirm receipt of Jardiance from [facility]. She has been approved through 09/07/21. Will plan to submit a renewal application in November/December. Patient given [employer] [facility] phone number to call for refills [phone]).  Patient also reports recent home BG values of 90-100s fasting without hypoglycemia Controlled; Most recent A1c at goal of <7% per ADA guidelines Continue empagliflozin 10 mg by mouth once daily Check A1c, fasting lipid panel, and BMP at next PCP visit  History of Stroke/Hyperlipidemia: Current medications: clopidogrel 75 mg by mouth daily, aspirin 325 mg by mouth daily  Patient reports that she was previously taking aspirin 81 mg daily but a doctor told her to increase it to 325 mg daily  Patient reports that she quit smoking in March 2022 Intolerances:  multiple statins (joint pain and liver function tests elevation 2X ULN) and ezetimibe Taking medications as directed: yes Side effects thought to be attributed to current medication regimen: no Uncontrolled; LDL above goal of <55 due to extreme risk given established clinical ASCVD + diabetes per 2020 AACE/ACE guidelines and TG at goal of <150 per 2020 AACE/ACE guidelines Patient continues to decline further cholesterol lowering medication; however, states that she will dicuss PCSK9 inhibitor option with her GI doctor in October and may consider therapy initiation if he approves. Statin intolerance noted. No statin due to serious side effects (ex. Myalgias with at least 2 different statins)  Unsure of the benefit of aspirin 325 mg daily vs the risk of bleeding. Will ask PCP to further assess aspirin 325 mg vs 81 mg at next PCP visit and consider referral to Neurology if unable to determine by primary care.   Hypertension: Current medications: amlodipine 10 mg by mouth once daily Intolerances:  ACEi  (cough), ARBs (feels bad) Taking medications as directed: yes Side effects thought to be attributed to current medication regimen: no Denies dizziness, lightheadedness, blurred vision, and headache Recent home blood pressure readings per patient report: 130s/70s Blood pressure control is unclear. Blood pressure is high at office visits, but patient reports normal blood pressures when  monitors outside of office. Blood pressure goal is <130/80 mmHg per 2017 AHA/ACC guidelines. Continue amlodipine 10 mg by mouth once daily If additional blood pressure lowering is needed, consider adding one of the following: thiazide (chlorthalidone 12.5 mg by mouth daily), beta blocker (carvedilol 3.125 mg by mouth daily), or hydralazine 25 mg by mouth three time daily.   Patient Goals/Self-Care Activities Over the next 90 days, patient will:  - check glucose at least once daily, document, and provide at future appointments check blood pressure at least once daily, document, and provide at future appointments collaborate with provider on medication access solutions  Follow Up Plan: Patient given contact information and requests that she call me when she is ready to further discuss her cholesterol management. Blood sugar and blood pressure appear to be well controlled at the moment.  Next PCP appointment scheduled for: 06/07/21     Medication Assistance:  Jardiance obtained through [place] medication assistance program.  Enrollment ends 09/07/21  Patient's preferred pharmacy is:  [facility] [place] [address] [place] [address] Phone: [phone] Fax: [phone]  Follow Up:  Patient requests no follow-up at this time. Kennon Holter, PharmD Clinical Pharmacist [facility] [phone]

## 2021-05-08 DIAGNOSIS — E7849 Other hyperlipidemia: Secondary | ICD-10-CM

## 2021-05-08 DIAGNOSIS — E785 Hyperlipidemia, unspecified: Secondary | ICD-10-CM | POA: Diagnosis not present

## 2021-05-08 DIAGNOSIS — I639 Cerebral infarction, unspecified: Secondary | ICD-10-CM | POA: Diagnosis not present

## 2021-05-08 DIAGNOSIS — E1159 Type 2 diabetes mellitus with other circulatory complications: Secondary | ICD-10-CM

## 2021-05-08 DIAGNOSIS — E1169 Type 2 diabetes mellitus with other specified complication: Secondary | ICD-10-CM | POA: Diagnosis not present

## 2021-05-08 DIAGNOSIS — I152 Hypertension secondary to endocrine disorders: Secondary | ICD-10-CM

## 2021-05-09 ENCOUNTER — Telehealth (INDEPENDENT_AMBULATORY_CARE_PROVIDER_SITE_OTHER): Payer: Self-pay | Admitting: *Deleted

## 2021-05-09 ENCOUNTER — Ambulatory Visit (INDEPENDENT_AMBULATORY_CARE_PROVIDER_SITE_OTHER): Payer: PPO | Admitting: Pharmacist

## 2021-05-09 DIAGNOSIS — I639 Cerebral infarction, unspecified: Secondary | ICD-10-CM | POA: Diagnosis not present

## 2021-05-09 DIAGNOSIS — I152 Hypertension secondary to endocrine disorders: Secondary | ICD-10-CM | POA: Diagnosis not present

## 2021-05-09 DIAGNOSIS — E7849 Other hyperlipidemia: Secondary | ICD-10-CM

## 2021-05-09 DIAGNOSIS — E785 Hyperlipidemia, unspecified: Secondary | ICD-10-CM

## 2021-05-09 DIAGNOSIS — E1159 Type 2 diabetes mellitus with other circulatory complications: Secondary | ICD-10-CM | POA: Diagnosis not present

## 2021-05-09 DIAGNOSIS — E1169 Type 2 diabetes mellitus with other specified complication: Secondary | ICD-10-CM

## 2021-05-09 NOTE — Chronic Care Management (AMB) (Signed)
Chronic Care Management Pharmacy Note  05/09/2021 Name:  Elizabeth [name] MRN:  [id number] DOB:  1941/08/31  Summary: Patient called to let me know that she continues to research and consider PCSK9 inhibitor therapy. Patient is awaiting input from Dr. Laural Golden and may consider therapy initiation if he approves. Patient reports she will reach out if she agrees to move forward with initiation.   Subjective: Elizabeth [name] is an 80 y.o. year old female who is a primary patient of Noreene Larsson, NP.  The CCM team was consulted for assistance with disease management and care coordination needs.    Engaged with patient by telephone for follow up visit in response to provider referral for pharmacy case management and/or care coordination services.   Consent to Services:  The patient was given information about Chronic Care Management services, agreed to services, and gave verbal consent prior to initiation of services.  Please see initial visit note for detailed documentation.   Patient Care Team: Noreene Larsson, NP as PCP - General (Nurse Practitioner) Particia Nearing, OD (Optometry) Rogene Houston, MD as Consulting Physician (Gastroenterology) Luanne Bras, MD as Consulting Physician (Interventional Radiology) Beryle Lathe, [facility] as Pharmacist  Objective:  Lab Results  Component Value Date   CREATININE 0.57 02/14/2021   CREATININE 0.81 11/05/2020   CREATININE 0.79 10/03/2020    Lab Results  Component Value Date   HGBA1C 6.2 (H) 10/03/2020   Last diabetic Eye exam:  Lab Results  Component Value Date/Time   HMDIABEYEEXA No Retinopathy 05/04/2014 12:00 AM    Last diabetic Foot exam: No results found for: HMDIABFOOTEX      Component Value Date/Time   CHOL 218 (H) 10/03/2020 0838   CHOL 242 (H) 08/09/2013 0828   TRIG 105 10/03/2020 0838   TRIG 180 (H) 09/06/2015 0947   TRIG 92 08/09/2013 0828   HDL 45 10/03/2020 0838   HDL 43 09/06/2015 0947   HDL 53 08/09/2013  0828   CHOLHDL 6.5 (H) 03/30/2019 0943   CHOLHDL 7.0 01/21/2017 1015   VLDL 27 01/21/2017 1015   LDLCALC 154 (H) 10/03/2020 0838   LDLCALC 230 (H) 02/24/2014 0837   LDLCALC 171 (H) 08/09/2013 0828    Hepatic Function Latest Ref Rng & Units 02/14/2021 11/05/2020 10/03/2020  Total Protein 6.5 - 8.1 g/dL 7.3 7.2 7.1  Albumin 3.5 - 5.0 g/dL 4.1 4.4 4.4  AST 15 - 41 U/L '27 27 28  ' ALT 0 - 44 U/L '28 23 22  ' Alk Phosphatase 38 - 126 U/L 98 146(H) 138(H)  Total Bilirubin 0.3 - 1.2 mg/dL 1.3(H) 0.4 0.5  Bilirubin, Direct 0.0 - 0.2 mg/dL - - -    Lab Results  Component Value Date/Time   TSH 1.790 10/03/2020 08:38 AM   TSH 1.850 03/30/2019 09:43 AM   FREET4 1.24 10/03/2020 08:38 AM   FREET4 1.2 10/26/2008 10:16 AM    CBC Latest Ref Rng & Units 02/14/2021 11/05/2020 10/03/2020  WBC 4.0 - 10.5 K/uL 5.5 5.3 6.0  Hemoglobin 12.0 - 15.0 g/dL 15.8(H) 13.0 10.3(L)  Hematocrit 36.0 - 46.0 % 47.3(H) 41.3 33.8(L)  Platelets 150 - 400 K/uL 199 252 331    Lab Results  Component Value Date/Time   VD25OH 47.5 11/10/2018 11:48 AM   VD25OH 30.6 06/29/2018 12:06 PM    Clinical ASCVD: No  The ASCVD Risk score Mikey Bussing DC Jr., et al., 2013) failed to calculate for the following reasons:   The patient has a prior MI  or stroke diagnosis    Social History   Tobacco Use  Smoking Status Former   Packs/day: 1.00   Years: 20.00   Pack years: 20.00   Types: Cigarettes   Start date: 01/15/1983   Quit date: 11/09/2020   Years since quitting: 0.4  Smokeless Tobacco Never   BP Readings from Last 3 Encounters:  04/16/21 (!) 151/75  02/20/21 (!) 152/67  02/05/21 117/78   Pulse Readings from Last 3 Encounters:  04/16/21 84  02/20/21 89  02/05/21 92   Wt Readings from Last 3 Encounters:  02/20/21 151 lb (68.5 kg)  02/05/21 151 lb (68.5 kg)  12/25/20 152 lb 1.6 oz (69 kg)    Assessment: Review of patient past medical history, allergies, medications, health status, including review of consultants reports,  laboratory and other test data, was performed as part of comprehensive evaluation and provision of chronic care management services.   SDOH:  (Social Determinants of Health) assessments and interventions performed:    CCM Care Plan  Allergies  Allergen Reactions   Banana Anaphylaxis   Codeine Nausea And Vomiting and Other (See Comments)    PROJECTILE VOMITING   Stevia [Stevioside] Swelling and Other (See Comments)    Face, tongue, and eye swelling    Actos [Pioglitazone] Swelling   Benicar [Olmesartan Medoxomil] Other (See Comments)    Feel bad   Chantix [Varenicline] Other (See Comments)    "Worked opposite."   Diovan [Valsartan] Other (See Comments)    Feel bad   Lipitor [Atorvastatin] Other (See Comments)    REACTION:  Joint pain   Spinach Other (See Comments)    Stomach problems   Vicodin [Hydrocodone-Acetaminophen] Nausea And Vomiting   Zocor [Simvastatin] Other (See Comments)    REACTION:  "made liver function incorrectly"   Ace Inhibitors Cough   Celebrex [Celecoxib] Rash   Latex Rash and Other (See Comments)    REACTION: red rash   Metformin And Related     GI - upset    Penicillins Rash and Other (See Comments)    Has patient had a PCN reaction causing immediate rash, facial/tongue/throat swelling, SOB or lightheadedness with hypotension: No Has patient had a PCN reaction causing severe rash involving mucus membranes or skin necrosis: No Has patient had a PCN reaction that required hospitalization: No- MD office Has patient had a PCN reaction occurring within the last 10 years: No If all of the above answers are "NO", then may proceed with Cephalosporin use.    Sulfonamide Derivatives Rash    Medications Reviewed Today     Reviewed by Beryle Lathe, [facility] (Pharmacist) on 04/25/21 at 1617  Med List Status: <None>   Medication Order Taking? Sig Documenting Provider Last Dose Status Informant  albuterol (VENTOLIN HFA) 108 (90 Base) MCG/ACT inhaler  [id number] Yes INHALE 2 PUFFS EVERY SIX HOURS AS NEEDED FOR WHEEZING OR SHORTNESS OF BREATH Janora Norlander, DO Taking Active            Med Note Beryle Lathe   Tue Apr 16, 2021  8:42 AM) Only takes 1-2 times per week in the Fall/Summer  amLODipine (NORVASC) 10 MG tablet [id number] Yes TAKE 1 TABLET BY MOUTH EVERY DAY Noreene Larsson, NP Taking Active   aspirin 325 MG tablet [id number] Yes Take 325 mg by mouth daily. [provider] Taking Active   Carboxymethylcellulose Sodium 1 % GEL [id number]  Place 1 drop into both eyes at bedtime. [provider]  Active Self  chlorpheniramine (CHLOR-TRIMETON) 4 MG tablet [id number] Yes Take 2-4 mg by mouth at bedtime as needed for allergies. [provider] Taking Active Self  CINNAMON PO [id number] Yes Take 1,000 mg by mouth daily as needed (for blood sugars).  [provider] Taking Active Self  clindamycin (CLEOCIN) 150 MG capsule [id number] Yes Take 150 mg by mouth 4 (four) times daily. [provider] Taking Active   clopidogrel (PLAVIX) 75 MG tablet [id number] Yes TAKE ONE TABLET BY MOUTH DAILY (PLEASE USE AUROBINDO BRAND, PT HD FOR ALLERGY TO REDDY). Hold for 5 days Barton Dubois, MD Taking Active   JARDIANCE 10 MG TABS tablet [id number] Yes TAKE 1 TABLET BY MOUTH ONCE DAILY (TO replace farxiga) [provider] Taking Active            Med Note Jim Like Apr 25, 2021  4:17 PM) Frances Maywood from [place] [employer] [facility]  pantoprazole (PROTONIX) 40 MG tablet [id number] Yes TAKE 1 TABLET BY MOUTH DAILY BEFORE BREAKFAST Rogene Houston, MD Taking Active     Discontinued 11/14/11 1453 (Error)   sodium chloride (OCEAN) 0.65 % SOLN nasal spray [id number] Yes Place 1 spray into both nostrils as needed for congestion. [provider] Taking Active Self  ursodiol (ACTIGALL) 300 MG capsule [id number] Yes Take 1 capsule (300 mg total) by mouth 3 (three) times daily. 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   History of iron deficiency anemia 12/22/2019   IDA (iron deficiency anemia) 07/04/2019   Hepatic fibrosis 07/04/2019   Abnormal transaminases 07/04/2019   Hyperlipidemia associated with type 2 diabetes mellitus ([place]) 03/25/2019   Rectal bleeding    Iron deficiency anemia 08/11/2018   Esophageal dysphagia 07/28/2018   Stroke ([place])    Elevated liver function tests 10/22/2017   Anxiety and depression 10/21/2017   Abdominal aortic atherosclerosis ([place]) 10/21/2017   Type 2 diabetes mellitus with hyperlipidemia ([place]) 09/06/2015   Type 2 diabetes mellitus with peripheral neuropathy ([place]) 11/27/2014   CVA (cerebral vascular accident) ([place]) 02/27/2012   Cerebral aneurysm 02/27/2012   Tobacco abuse 02/27/2012   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 02/27/2012   Gait instability 02/27/2012   Vision disturbance following cerebrovascular accident 02/27/2012   Dyspnea 11/14/2011   Thyroid disease 10/12/2008   Diabetes ([place]) 08/29/2008   Hyperlipidemia 08/29/2008   Obesity, unspecified 08/29/2008   COPD (chronic obstructive pulmonary disease) ([place]) 08/29/2008   WEIGHT LOSS 08/29/2008   DIARRHEA 08/29/2008   Hypertension associated with diabetes ([place]) 08/28/2008   ASTHMA 08/28/2008   ESOPHAGEAL STRICTURE 08/28/2008   GERD (gastroesophageal reflux disease) 08/28/2008   HIATAL HERNIA 08/28/2008   Diverticulosis of colon 08/28/2008   COLONIC POLYPS, HX OF 08/28/2008    Immunization History  Administered Date(s) Administered   Influenza Whole 06/08/2010   Influenza, High Dose Seasonal PF 06/17/2019   Influenza,inj,Quad PF,6+ Mos 07/07/2013, 07/06/2014, 07/17/2017   Influenza-Unspecified 08/05/2016   Pneumococcal Conjugate-13 11/25/2013   Pneumococcal  Polysaccharide-23 09/09/2007    Conditions to be addressed/monitored: HTN, HLD, and DMII  Care Plan : Medication Management  Updates made by Beryle Lathe, [place] since 05/09/2021 12:00 AM     Problem: Diabetes, Hyperlipidemia/History of Stroke, Hypertension   Priority: High  Onset Date: 04/11/2021  Long-Range Goal: Disease Progression Prevention   Start Date: 04/11/2021  Expected End Date: 07/10/2021  Recent Progress: On track  Priority: High  Note:   Current Barriers:  Unable to achieve control of hyperlipidemia  Suboptimal therapeutic regimen for hyperlipidemia  Pharmacist Clinical Goal(s):  Over the next 90 days, patient will achieve control of hyperlipidemia as evidenced by lower LDL 4-12 weeks after initiation new LDL lowering therapy adhere to plan to optimize therapeutic regimen for hyperlipidemia as evidenced by report of adherence to recommended medication management changes contact provider office for questions/concerns as evidenced notation of same in electronic health record through collaboration with PharmD and provider.   Interventions: 1:1 collaboration with Noreene Larsson, NP regarding development and update of comprehensive plan of care as evidenced by provider attestation and co-signature Inter-disciplinary care team collaboration (see longitudinal plan of care) Comprehensive medication review performed; medication list updated in electronic medical record  Diabetes: Current medications: empagliflozin (Jardiance) 10 mg by mouth once daily (receives from [employer]) Intolerances:  pioglitazone (swelling) , metformin (GI upset) Taking medications as directed: yes Side effects thought to be attributed to current medication regimen: no Denies hypoglycemic/hyperglycemic symptoms Most recent home BG values of 90-100s fasting without hypoglycemia Controlled; Most recent A1c at goal of <7% per ADA guidelines Continue empagliflozin 10 mg by mouth once daily Check  A1c, fasting lipid panel, and BMP at next PCP visit  History of Stroke/Hyperlipidemia: Current medications: clopidogrel 75 mg by mouth daily, aspirin 325 mg by mouth daily  Patient reports that she was previously taking aspirin 81 mg daily but a doctor told her to increase it to 325 mg daily  Patient reports that she quit smoking in March 2022 Intolerances:  multiple statins (joint pain and liver function tests elevation 2X ULN) and ezetimibe Taking medications as directed: yes Side effects thought to be attributed to current medication regimen: no Uncontrolled; LDL above goal of <55 due to extreme risk given established clinical ASCVD + diabetes per 2020 AACE/ACE guidelines and TG at goal of <150 per 2020 AACE/ACE guidelines Patient called to let me know that she continues to research and consider PCSK9 inhibitor therapy. Patient is awaiting input from Dr. Laural Golden and may consider therapy initiation if he approves. Statin intolerance noted. No statin due to serious side effects (ex. Myalgias with at least 2 different statins)  Unsure of the benefit of aspirin 325 mg daily vs the risk of bleeding. Will ask PCP to further assess aspirin 325 mg vs 81 mg at next PCP visit and consider referral to Neurology if unable to determine by primary care.   Hypertension: Current medications: amlodipine 10 mg by mouth once daily Intolerances:  ACEi (cough), ARBs (feels bad) Taking medications as directed: yes Side effects thought to be attributed to current medication regimen: no Denies dizziness, lightheadedness, blurred vision, and headache Most recent home blood pressure readings per patient report: 130s/70s Blood pressure control is unclear. Blood pressure is high at office visits, but patient reports normal blood pressures when monitors outside of office. Blood pressure goal is <130/80 mmHg per 2017 AHA/ACC guidelines. Continue amlodipine 10 mg by mouth once daily If additional blood pressure lowering  is needed, consider adding one of the following: thiazide (chlorthalidone 12.5 mg by mouth daily), beta blocker (carvedilol 3.125 mg by mouth daily), or hydralazine 25 mg by mouth three time daily.   Patient Goals/Self-Care Activities Over the next 90 days, patient will:  - check glucose at least once daily, document, and provide  at future appointments check blood pressure at least once daily, document, and provide at future appointments collaborate with provider on medication access solutions  Follow Up Plan: Patient given contact information and requests that she call me when she is ready to further discuss her cholesterol management. Blood sugar and blood pressure appear to be well controlled at the moment.  Next PCP appointment scheduled for: 06/07/21     Medication Assistance:  Jardiance obtained through [place] medication assistance program.  Enrollment ends December 2022  Patient's preferred pharmacy is:  [facility], [place] [address] [place] [address] Phone: [phone] Fax: [phone]  Follow Up:  Patient requests no follow-up at this time.  Plan: The patient has been provided with contact information for the care management team and has been advised to call with any health related questions or concerns. Has follow-up with primary care provider in 4 weeks.  Kennon Holter, PharmD Clinical Pharmacist [facility] [phone]

## 2021-05-09 NOTE — Patient Instructions (Signed)
Elizabeth [name],  It was great to talk to you today!  Please call me with any questions or concerns.   Visit Information  PATIENT GOALS:  Goals Addressed             This Visit's Progress    Medication Management       Patient Goals/Self-Care Activities Over the next 90 days, patient will:  Check glucose at least once daily, document, and provide at future appointments Check blood pressure at least once daily, document, and provide at future appointments Collaborate with provider on medication access solutions            The patient verbalized understanding of instructions, educational materials, and care plan provided today and declined offer to receive copy of patient instructions, educational materials, and care plan.   The patient has been provided with contact information for the care management team and has been advised to call with any health related questions or concerns.   Kennon Holter, PharmD Clinical Pharmacist [facility] [phone]

## 2021-05-09 NOTE — Telephone Encounter (Signed)
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 Would like Dr. Olevia Perches opinion before starting med. States LDL is over 100 but not sure exact number. States she has taken cholesterol med for over 20 years. Not currently on any cholesterol meds. States cardiologist also wants her to take cholesterol meds but pt is hesitate. Pt states she trust what Dr. Laural Golden wants her to do.   [phone]

## 2021-05-09 NOTE — Telephone Encounter (Signed)
error 

## 2021-05-10 NOTE — Telephone Encounter (Signed)
Spoke with dr Laural Golden and he states he has no objection to her starting PCSK9 injections. She should have liver profile 4 weeks after starting med. Either dr Laural Golden can order or her pcp can order. I called and left pt a message to return call

## 2021-05-14 ENCOUNTER — Encounter (HOSPITAL_COMMUNITY): Payer: Self-pay | Admitting: Hematology

## 2021-05-14 ENCOUNTER — Ambulatory Visit: Payer: HMO

## 2021-05-14 ENCOUNTER — Telehealth: Payer: HMO

## 2021-05-14 ENCOUNTER — Other Ambulatory Visit: Payer: Self-pay | Admitting: Nurse Practitioner

## 2021-05-14 DIAGNOSIS — E7849 Other hyperlipidemia: Secondary | ICD-10-CM

## 2021-05-14 DIAGNOSIS — I639 Cerebral infarction, unspecified: Secondary | ICD-10-CM

## 2021-05-14 MED ORDER — PRALUENT 75 MG/ML ~~LOC~~ SOAJ: 75.0000 mg | SUBCUTANEOUS | 3 refills | Status: DC

## 2021-05-14 NOTE — Telephone Encounter (Signed)
Called and discussed with pt. Pt verbalized understanding. Will call back if she wants dr Laural Golden to order liver profile 4 weeks after starting injection.

## 2021-05-16 ENCOUNTER — Ambulatory Visit: Payer: HMO

## 2021-05-16 ENCOUNTER — Ambulatory Visit: Payer: Self-pay

## 2021-05-16 ENCOUNTER — Other Ambulatory Visit (HOSPITAL_COMMUNITY): Payer: Self-pay | Admitting: Interventional Radiology

## 2021-05-16 DIAGNOSIS — I671 Cerebral aneurysm, nonruptured: Secondary | ICD-10-CM

## 2021-05-21 ENCOUNTER — Ambulatory Visit: Payer: Self-pay

## 2021-06-03 ENCOUNTER — Encounter (HOSPITAL_COMMUNITY): Payer: Self-pay | Admitting: Hematology

## 2021-06-05 ENCOUNTER — Encounter (HOSPITAL_COMMUNITY): Payer: Self-pay | Admitting: Hematology

## 2021-06-06 ENCOUNTER — Telehealth: Payer: Self-pay

## 2021-06-06 NOTE — Telephone Encounter (Signed)
Patient called and has received a estimate of services on 06/18/21 with [facility] - It is for 111.00.  I have talked with Gerald Stabs and I think this is a estimate because she is between insurance coverage now and will have [facility] 06/08/21 but this is not in Epic yet.  I have told her we will follow up on the billing when she comes in on 06/18/21 and enter new coverage.

## 2021-06-07 ENCOUNTER — Ambulatory Visit: Payer: HMO | Admitting: Nurse Practitioner

## 2021-06-10 ENCOUNTER — Other Ambulatory Visit: Payer: Self-pay

## 2021-06-10 ENCOUNTER — Encounter (HOSPITAL_COMMUNITY): Payer: Self-pay | Admitting: Hematology

## 2021-06-10 ENCOUNTER — Ambulatory Visit (HOSPITAL_COMMUNITY)
Admission: RE | Admit: 2021-06-10 | Discharge: 2021-06-10 | Disposition: A | Discharge status: Home / Self Care | Payer: Medicare Other | Source: Ambulatory Visit | Attending: Interventional Radiology | Admitting: Interventional Radiology

## 2021-06-10 DIAGNOSIS — I671 Cerebral aneurysm, nonruptured: Secondary | ICD-10-CM | POA: Diagnosis not present

## 2021-06-10 DIAGNOSIS — I6503 Occlusion and stenosis of bilateral vertebral arteries: Secondary | ICD-10-CM | POA: Diagnosis not present

## 2021-06-10 DIAGNOSIS — I6621 Occlusion and stenosis of right posterior cerebral artery: Secondary | ICD-10-CM | POA: Diagnosis not present

## 2021-06-10 DIAGNOSIS — I6603 Occlusion and stenosis of bilateral middle cerebral arteries: Secondary | ICD-10-CM | POA: Diagnosis not present

## 2021-06-10 DIAGNOSIS — I672 Cerebral atherosclerosis: Secondary | ICD-10-CM | POA: Diagnosis not present

## 2021-06-10 LAB — POCT I-STAT CREATININE: Creatinine, Ser: 0.8 mg/dL (ref 0.44–1.00)

## 2021-06-10 MED ORDER — IOHEXOL 350 MG/ML SOLN
75.0000 mL | Freq: Once | INTRAVENOUS | Status: AC | PRN
  Administered 2021-06-10: 75 mL via INTRAVENOUS

## 2021-06-12 ENCOUNTER — Telehealth (HOSPITAL_COMMUNITY): Payer: Self-pay

## 2021-06-12 NOTE — Telephone Encounter (Signed)
Pt agreed to f/u in 6 months with cta head/neck. AW 

## 2021-06-18 ENCOUNTER — Ambulatory Visit (INDEPENDENT_AMBULATORY_CARE_PROVIDER_SITE_OTHER): Payer: Medicare Other | Admitting: Pharmacist

## 2021-06-18 ENCOUNTER — Other Ambulatory Visit: Payer: Self-pay

## 2021-06-18 ENCOUNTER — Encounter (INDEPENDENT_AMBULATORY_CARE_PROVIDER_SITE_OTHER): Payer: Self-pay

## 2021-06-18 DIAGNOSIS — E1159 Type 2 diabetes mellitus with other circulatory complications: Secondary | ICD-10-CM

## 2021-06-18 DIAGNOSIS — I639 Cerebral infarction, unspecified: Secondary | ICD-10-CM

## 2021-06-18 DIAGNOSIS — E1169 Type 2 diabetes mellitus with other specified complication: Secondary | ICD-10-CM

## 2021-06-18 DIAGNOSIS — I152 Hypertension secondary to endocrine disorders: Secondary | ICD-10-CM

## 2021-06-18 DIAGNOSIS — E7849 Other hyperlipidemia: Secondary | ICD-10-CM

## 2021-06-18 DIAGNOSIS — E785 Hyperlipidemia, unspecified: Secondary | ICD-10-CM

## 2021-06-18 NOTE — Chronic Care Management (AMB) (Addendum)
Chronic Care Management Pharmacy Note  06/18/2021 Name:  Elizabeth [name] MRN:  [id number] DOB:  [birth year]  Summary:  Patient agreeable to move forward with PCSK9 inhibitor therapy. Will plan to start Praluent 75 mg subcutaneously every 14 days. Patient assistance application was completed and faxed on 06/18/21. Will follow-up on approval.  Repeat lipid panel 4-12 weeks after initiation and continued use. Can titrate dose to 150 mg subcutaneously every 14 days if necessary. Patient would like to complete first injection in office due to history of allergic reactions to foods/medications. Patient will call and setup follow-up appointment when she receives the medication  Subjective: Elizabeth [name] is an 80 y.o. year old female who is a primary patient of Noreene Larsson, NP.  The CCM team was consulted for assistance with disease management and care coordination needs.    Engaged with patient face to face for follow up visit in response to provider referral for pharmacy case management and/or care coordination services.   Consent to Services:  The patient was given information about Chronic Care Management services, agreed to services, and gave verbal consent prior to initiation of services.  Please see initial visit note for detailed documentation.   Patient Care Team: Noreene Larsson, NP as PCP - General (Nurse Practitioner) Particia Nearing, OD (Optometry) Rogene Houston, MD as Consulting Physician (Gastroenterology) Luanne Bras, MD as Consulting Physician (Interventional Radiology) Beryle Lathe, [facility] as Pharmacist  Objective:  Lab Results  Component Value Date   CREATININE 0.80 06/10/2021   CREATININE 0.57 02/14/2021   CREATININE 0.81 11/05/2020    Lab Results  Component Value Date   HGBA1C 6.2 (H) 10/03/2020   Last diabetic Eye exam:  Lab Results  Component Value Date/Time   HMDIABEYEEXA No Retinopathy 05/04/2014 12:00 AM    Last diabetic Foot exam: No  results found for: HMDIABFOOTEX      Component Value Date/Time   CHOL 218 (H) 10/03/2020 0838   CHOL 242 (H) 08/09/2013 0828   TRIG 105 10/03/2020 0838   TRIG 180 (H) 09/06/2015 0947   TRIG 92 08/09/2013 0828   HDL 45 10/03/2020 0838   HDL 43 09/06/2015 0947   HDL 53 08/09/2013 0828   CHOLHDL 6.5 (H) 03/30/2019 0943   CHOLHDL 7.0 01/21/2017 1015   VLDL 27 01/21/2017 1015   LDLCALC 154 (H) 10/03/2020 0838   LDLCALC 230 (H) 02/24/2014 0837   LDLCALC 171 (H) 08/09/2013 0828    Hepatic Function Latest Ref Rng & Units 02/14/2021 11/05/2020 10/03/2020  Total Protein 6.5 - 8.1 g/dL 7.3 7.2 7.1  Albumin 3.5 - 5.0 g/dL 4.1 4.4 4.4  AST 15 - 41 U/L '27 27 28  ' ALT 0 - 44 U/L '28 23 22  ' Alk Phosphatase 38 - 126 U/L 98 146(H) 138(H)  Total Bilirubin 0.3 - 1.2 mg/dL 1.3(H) 0.4 0.5  Bilirubin, Direct 0.0 - 0.2 mg/dL - - -    Lab Results  Component Value Date/Time   TSH 1.790 10/03/2020 08:38 AM   TSH 1.850 03/30/2019 09:43 AM   FREET4 1.24 10/03/2020 08:38 AM   FREET4 1.2 10/26/2008 10:16 AM    CBC Latest Ref Rng & Units 02/14/2021 11/05/2020 10/03/2020  WBC 4.0 - 10.5 K/uL 5.5 5.3 6.0  Hemoglobin 12.0 - 15.0 g/dL 15.8(H) 13.0 10.3(L)  Hematocrit 36.0 - 46.0 % 47.3(H) 41.3 33.8(L)  Platelets 150 - 400 K/uL 199 252 331    Lab Results  Component Value Date/Time   VD25OH 47.5 11/10/2018  11:48 AM   VD25OH 30.6 06/29/2018 12:06 PM    Clinical ASCVD: Yes  The ASCVD Risk score (Arnett DK, et al., 2019) failed to calculate for the following reasons:   The patient has a prior MI or stroke diagnosis     Social History   Tobacco Use  Smoking Status Former   Packs/day: 1.00   Years: 20.00   Pack years: 20.00   Types: Cigarettes   Start date: 01/15/1983   Quit date: 11/09/2020   Years since quitting: 0.6  Smokeless Tobacco Never   BP Readings from Last 3 Encounters:  04/16/21 (!) 151/75  02/20/21 (!) 152/67  02/05/21 117/78   Pulse Readings from Last 3 Encounters:  04/16/21 84   02/20/21 89  02/05/21 92   Wt Readings from Last 3 Encounters:  02/20/21 151 lb (68.5 kg)  02/05/21 151 lb (68.5 kg)  12/25/20 152 lb 1.6 oz (69 kg)    Assessment: Review of patient past medical history, allergies, medications, health status, including review of consultants reports, laboratory and other test data, was performed as part of comprehensive evaluation and provision of chronic care management services.   SDOH:  (Social Determinants of Health) assessments and interventions performed:    CCM Care Plan  Allergies  Allergen Reactions   Banana Anaphylaxis   Codeine Nausea And Vomiting and Other (See Comments)    PROJECTILE VOMITING   Stevia [Stevioside] Swelling and Other (See Comments)    Face, tongue, and eye swelling    Actos [Pioglitazone] Swelling   Benicar [Olmesartan Medoxomil] Other (See Comments)    Feel bad   Chantix [Varenicline] Other (See Comments)    "Worked opposite."   Diovan [Valsartan] Other (See Comments)    Feel bad   Lipitor [Atorvastatin] Other (See Comments)    REACTION:  Joint pain   Spinach Other (See Comments)    Stomach problems   Vicodin [Hydrocodone-Acetaminophen] Nausea And Vomiting   Zocor [Simvastatin] Other (See Comments)    REACTION:  "made liver function incorrectly"   Ace Inhibitors Cough   Celebrex [Celecoxib] Rash   Latex Rash and Other (See Comments)    REACTION: red rash   Metformin And Related     GI - upset    Penicillins Rash and Other (See Comments)    Has patient had a PCN reaction causing immediate rash, facial/tongue/throat swelling, SOB or lightheadedness with hypotension: No Has patient had a PCN reaction causing severe rash involving mucus membranes or skin necrosis: No Has patient had a PCN reaction that required hospitalization: No- MD office Has patient had a PCN reaction occurring within the last 10 years: No If all of the above answers are "NO", then may proceed with Cephalosporin use.    Sulfonamide  Derivatives Rash    Medications Reviewed Today     Reviewed by Beryle Lathe, [facility] (Pharmacist) on 06/18/21 at 0954  Med List Status: <None>   Medication Order Taking? Sig Documenting Provider Last Dose Status Informant  albuterol (VENTOLIN HFA) 108 (90 Base) MCG/ACT inhaler [id number] Yes INHALE 2 PUFFS EVERY SIX HOURS AS NEEDED FOR WHEEZING OR SHORTNESS OF BREATH Janora Norlander, DO Taking Active            Med Note Beryle Lathe   Tue Apr 16, 2021  8:42 AM) Only takes 1-2 times per week in the Fall/Summer  Alirocumab (PRALUENT) 75 MG/ML SOAJ [id number] No Inject 75 mg into the skin every 14 (fourteen) days.  Patient not taking: Reported  on 06/18/2021   Noreene Larsson, NP Not Taking Active   amLODipine (NORVASC) 10 MG tablet [id number] Yes TAKE 1 TABLET BY MOUTH EVERY DAY Noreene Larsson, NP Taking Active   aspirin 325 MG tablet [id number] Yes Take 325 mg by mouth daily. [provider] Taking Active   Carboxymethylcellulose Sodium 1 % GEL [id number] Yes Place 1 drop into both eyes at bedtime. [provider] Taking Active Self  chlorpheniramine (CHLOR-TRIMETON) 4 MG tablet [id number] Yes Take 2-4 mg by mouth at bedtime as needed for allergies. [provider] Taking Active Self  CINNAMON PO [id number] Yes Take 1,000 mg by mouth daily as needed (for blood sugars).  [provider] Taking Active Self  clopidogrel (PLAVIX) 75 MG tablet [id number] Yes TAKE ONE TABLET BY MOUTH DAILY (PLEASE USE AUROBINDO BRAND, PT HD FOR ALLERGY TO REDDY). Hold for 5 days Barton Dubois, MD Taking Active   JARDIANCE 10 MG TABS tablet [id number] Yes TAKE 1 TABLET BY MOUTH ONCE DAILY (TO replace farxiga) [provider] Taking Active            Med Note Jim Like Apr 25, 2021  4:17 PM) Frances Maywood from [place] Patient Assistance Program  pantoprazole (PROTONIX) 40 MG tablet [id number] Yes TAKE 1 TABLET BY MOUTH DAILY BEFORE BREAKFAST Rogene Houston, MD Taking Active     Discontinued 11/14/11 1453 (Error)   sodium chloride (OCEAN) 0.65 % SOLN nasal spray [id number] Yes Place 1 spray into both nostrils as needed for congestion. [provider] Taking Active Self  ursodiol (ACTIGALL) 300 MG capsule [id number] Yes Take 1 capsule (300 mg total) by mouth 3 (three) times daily. 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) 10/21/2017   Type 2 diabetes mellitus with hyperlipidemia ([place]) 09/06/2015   Type 2 diabetes mellitus with peripheral neuropathy ([place]) 11/27/2014   CVA (cerebral vascular accident) ([place]) 02/27/2012   Cerebral aneurysm 02/27/2012   Tobacco abuse 02/27/2012   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 02/27/2012   Gait instability 02/27/2012   Vision disturbance following cerebrovascular accident 02/27/2012   Dyspnea 11/14/2011   Thyroid disease 10/12/2008   Diabetes ([place]) 08/29/2008   Hyperlipidemia 08/29/2008   Obesity, unspecified 08/29/2008   COPD (chronic obstructive pulmonary disease) ([place]) 08/29/2008   WEIGHT LOSS 08/29/2008   DIARRHEA 08/29/2008   Hypertension associated with diabetes ([place]) 08/28/2008   ASTHMA 08/28/2008   ESOPHAGEAL STRICTURE 08/28/2008   GERD (gastroesophageal reflux disease) 08/28/2008   HIATAL HERNIA 08/28/2008   Diverticulosis of colon  08/28/2008   COLONIC POLYPS, HX OF 08/28/2008    Immunization History  Administered Date(s) Administered   Influenza Whole 06/08/2010   Influenza, High Dose Seasonal PF 06/17/2019   Influenza,inj,Quad PF,6+ Mos 07/07/2013, 07/06/2014, 07/17/2017   Influenza-Unspecified 08/05/2016   Pneumococcal Conjugate-13 11/25/2013  Pneumococcal Polysaccharide-23 09/09/2007    Conditions to be addressed/monitored: HTN, HLD, and DMII  Care Plan : Medication Management  Updates made by Beryle Lathe, [place] since 06/18/2021 12:00 AM     Problem: Diabetes, Hyperlipidemia/History of Stroke, Hypertension   Priority: High  Onset Date: 04/11/2021     Long-Range Goal: Disease Progression Prevention   Start Date: 04/11/2021  Expected End Date: 07/10/2021  Recent Progress: On track  Priority: High  Note:   Current Barriers:  Unable to achieve control of hyperlipidemia  Suboptimal therapeutic regimen for hyperlipidemia  Pharmacist Clinical Goal(s):  Over the next 90 days, patient will achieve control of hyperlipidemia as evidenced by lower LDL 4-12 weeks after initiation new LDL lowering therapy adhere to plan to optimize therapeutic regimen for hyperlipidemia as evidenced by report of adherence to recommended medication management changes contact provider office for questions/concerns as evidenced notation of same in electronic health record through collaboration with PharmD and provider.   Interventions: 1:1 collaboration with Noreene Larsson, NP regarding development and update of comprehensive plan of care as evidenced by provider attestation and co-signature Inter-disciplinary care team collaboration (see longitudinal plan of care) Comprehensive medication review performed; medication list updated in electronic medical record  Diabetes: Current medications: empagliflozin (Jardiance) 10 mg by mouth once daily (receives from [employer]) Intolerances:  pioglitazone (swelling) , metformin (GI  upset) Taking medications as directed: yes Side effects thought to be attributed to current medication regimen: no Denies hypoglycemic/hyperglycemic symptoms Most recent home BG values of 90-100s fasting without hypoglycemia; blood glucose 110 2 hours after breakfast per patient report on 06/18/21 Controlled; Most recent A1c at goal of <7% per ADA guidelines Continue empagliflozin 10 mg by mouth once daily Check A1c, fasting lipid panel, and BMP at next PCP visit  History of Stroke/Hyperlipidemia: Current medications: clopidogrel 75 mg by mouth daily, aspirin 325 mg by mouth daily  Patient reports that she was previously taking aspirin 81 mg daily but a doctor told her to increase it to 325 mg daily  Patient reports that she quit smoking in March 2022 Intolerances:  multiple statins (joint pain and liver function tests elevation 2X ULN) and ezetimibe Taking medications as directed: yes Side effects thought to be attributed to current medication regimen: no Uncontrolled; LDL above goal of <55 due to extreme risk given established clinical ASCVD + diabetes per 2020 AACE/ACE guidelines and TG at goal of <150 per 2020 AACE/ACE guidelines Patient agreeable to move forward with PCSK9 inhibitor therapy. Will plan to start Praluent 75 mg subcutaneously every 14 days. Patient assistance application was completed and faxed on 06/18/21. Will follow-up on approval.  Patient would like to complete first injection in office due to history of allergic reactions to foods/medications. Patient will call and setup follow-up appointment when she receives the medication Repeat lipid panel 4-12 weeks after initiation and continued use. Can titrate dose to 150 mg subcutaneously every 14 days if necessary. Statin intolerance noted. No statin due to serious side effects (ex. Myalgias with at least 2 different statins)  Unsure of the benefit of aspirin 325 mg daily vs the risk of bleeding. Will ask PCP to further assess  aspirin 325 mg vs 81 mg at next PCP visit and consider referral to Neurology if unable to determine by primary care.   Hypertension: Current medications: amlodipine 10 mg by mouth once daily Intolerances:  ACEi (cough), ARBs (feels bad) Taking medications as directed: yes Side effects thought to be attributed to current medication  regimen: no Denies dizziness, lightheadedness, blurred vision, and headache Most recent home blood pressure readings per patient report: 130s/70s Blood pressure control is unclear. Blood pressure is high at office visits, but patient reports normal blood pressures when monitors outside of office. Blood pressure goal is <130/80 mmHg per 2017 AHA/ACC guidelines. Continue amlodipine 10 mg by mouth once daily If additional blood pressure lowering is needed, consider adding one of the following: thiazide (chlorthalidone 12.5 mg by mouth daily), beta blocker (carvedilol 3.125 mg by mouth daily), or hydralazine 25 mg by mouth three time daily.   Patient Goals/Self-Care Activities Over the next 90 days, patient will:  - check glucose at least once daily, document, and provide at future appointments check blood pressure at least once daily, document, and provide at future appointments collaborate with provider on medication access solutions  Follow Up Plan: Patient given contact information and instructed to call when she receives the [place]. Patient will call to schedule appointment for first injection.      Medication Assistance:  see care plan for more details  Patient's preferred pharmacy is:  [facility], [place] [address] [place] [address] Phone: [phone] Fax: [phone]  Follow Up:  Patient agrees to Care Plan and Follow-up.  Plan:  Patient given contact information and instructed to call when she receives the [place]. Patient will call to schedule appointment for first injection.   Kennon Holter,  PharmD Clinical Pharmacist [facility] Primary [facility] [phone]

## 2021-06-18 NOTE — Patient Instructions (Signed)
Elizabeth [name],  It was great to talk to you today!  Please call me with any questions or concerns.   Visit Information  PATIENT GOALS:  Goals Addressed             This Visit's Progress    Medication Management       Patient Goals/Self-Care Activities Over the next 90 days, patient will:  Check glucose at least once daily, document, and provide at future appointments Check blood pressure at least once daily, document, and provide at future appointments Collaborate with provider on medication access solutions              The patient verbalized understanding of instructions, educational materials, and care plan provided today and declined offer to receive copy of patient instructions, educational materials, and care plan.   Patient will call and setup follow-up appointment to complete first Praluent injection  Kennon Holter, PharmD Clinical Pharmacist [facility] Primary [facility] [phone]

## 2021-06-19 ENCOUNTER — Ambulatory Visit: Payer: Medicare Other | Admitting: Pharmacist

## 2021-06-19 DIAGNOSIS — I152 Hypertension secondary to endocrine disorders: Secondary | ICD-10-CM

## 2021-06-19 DIAGNOSIS — I639 Cerebral infarction, unspecified: Secondary | ICD-10-CM

## 2021-06-19 DIAGNOSIS — E7849 Other hyperlipidemia: Secondary | ICD-10-CM

## 2021-06-19 DIAGNOSIS — E785 Hyperlipidemia, unspecified: Secondary | ICD-10-CM

## 2021-06-19 DIAGNOSIS — E1169 Type 2 diabetes mellitus with other specified complication: Secondary | ICD-10-CM

## 2021-06-19 NOTE — Patient Instructions (Signed)
Elizabeth [name],  It was great to talk to you today!  Please call me with any questions or concerns.   Visit Information  PATIENT GOALS:  Goals Addressed             This Visit's Progress    Medication Management       Patient Goals/Self-Care Activities Over the next 90 days, patient will:  Check glucose at least once daily, document, and provide at future appointments Check blood pressure at least once daily, document, and provide at future appointments Collaborate with provider on medication access solutions. Bring proof of income such as Fish farm manager income statement for you and your spouse.               The patient verbalized understanding of instructions, educational materials, and care plan provided today and declined offer to receive copy of patient instructions, educational materials, and care plan.   Patient will call to setup appointment when receives Praluent for first injection  Kennon Holter, PharmD Clinical Pharmacist [facility] Primary Care [phone]

## 2021-06-19 NOTE — Chronic Care Management (AMB) (Signed)
Chronic Care Management Pharmacy Note  06/19/2021 Name:  Elizabeth [name] MRN:  [id number] DOB:  11-Jul-1941  Summary:  History of Stroke/Hyperlipidemia: Patient agreeable to move forward with PCSK9 inhibitor therapy. Will plan to start Praluent 75 mg subcutaneously every 14 days. Patient assistance application was completed and faxed on 06/18/21. Will follow-up on approval.  Received notification from [employer] program that patient needs to submit proof of income and proof of at least $500 of expenses at the pharmacy this year.  Called [facility] and received fax of expense summary from this calendar year.  Patient was notified to bring proof of income such as social security income statement for her and her spouse. Will fax these forms to [employer] program.  Patient would like to complete first injection in office due to history of allergic reactions to foods/medications. Patient will call and setup follow-up appointment when she receives the medication Repeat lipid panel 4-12 weeks after initiation and continued use. Can titrate dose to 150 mg subcutaneously every 14 days if necessary.  Subjective: Elizabeth [name] is an 80 y.o. year old female who is a primary patient of Noreene Larsson, NP.  The CCM team was consulted for assistance with disease management and care coordination needs.    Engaged with patient by telephone for follow up visit in response to provider referral for pharmacy case management and/or care coordination services.   Consent to Services:  The patient was given information about Chronic Care Management services, agreed to services, and gave verbal consent prior to initiation of services.  Please see initial visit note for detailed documentation.   Patient Care Team: Noreene Larsson, NP as PCP - General (Nurse Practitioner) Particia Nearing, OD (Optometry) Rogene Houston, MD as Consulting Physician (Gastroenterology) Luanne Bras, MD as Consulting Physician  (Interventional Radiology) Beryle Lathe, [facility] as Pharmacist  Objective:  Lab Results  Component Value Date   CREATININE 0.80 06/10/2021   CREATININE 0.57 02/14/2021   CREATININE 0.81 11/05/2020    Lab Results  Component Value Date   HGBA1C 6.2 (H) 10/03/2020   Last diabetic Eye exam:  Lab Results  Component Value Date/Time   HMDIABEYEEXA No Retinopathy 05/04/2014 12:00 AM    Last diabetic Foot exam: No results found for: HMDIABFOOTEX      Component Value Date/Time   CHOL 218 (H) 10/03/2020 0838   CHOL 242 (H) 08/09/2013 0828   TRIG 105 10/03/2020 0838   TRIG 180 (H) 09/06/2015 0947   TRIG 92 08/09/2013 0828   HDL 45 10/03/2020 0838   HDL 43 09/06/2015 0947   HDL 53 08/09/2013 0828   CHOLHDL 6.5 (H) 03/30/2019 0943   CHOLHDL 7.0 01/21/2017 1015   VLDL 27 01/21/2017 1015   LDLCALC 154 (H) 10/03/2020 0838   LDLCALC 230 (H) 02/24/2014 0837   LDLCALC 171 (H) 08/09/2013 0828    Hepatic Function Latest Ref Rng & Units 02/14/2021 11/05/2020 10/03/2020  Total Protein 6.5 - 8.1 g/dL 7.3 7.2 7.1  Albumin 3.5 - 5.0 g/dL 4.1 4.4 4.4  AST 15 - 41 U/L _0 ALT 0 - 44 U/L _1 Alk Phosphatase 38 - 126 U/L 98 146(H) 138(H)  Total Bilirubin 0.3 - 1.2 mg/dL 1.3(H) 0.4 0.5  Bilirubin, Direct 0.0 - 0.2 mg/dL - - -    Lab Results  Component Value Date/Time   TSH 1.790 10/03/2020 08:38 AM   TSH 1.850 03/30/2019 09:43 AM   FREET4 1.24 10/03/2020 08:38  AM   FREET4 1.2 10/26/2008 10:16 AM    CBC Latest Ref Rng & Units 02/14/2021 11/05/2020 10/03/2020  WBC 4.0 - 10.5 K/uL 5.5 5.3 6.0  Hemoglobin 12.0 - 15.0 g/dL 15.8(H) 13.0 10.3(L)  Hematocrit 36.0 - 46.0 % 47.3(H) 41.3 33.8(L)  Platelets 150 - 400 K/uL 199 252 331    Lab Results  Component Value Date/Time   VD25OH 47.5 11/10/2018 11:48 AM   VD25OH 30.6 06/29/2018 12:06 PM    Clinical ASCVD: Yes  The ASCVD Risk score (Arnett DK, et al., 2019) failed to calculate for the following reasons:   The patient has a  prior MI or stroke diagnosis     Social History   Tobacco Use  Smoking Status Former   Packs/day: 1.00   Years: 20.00   Pack years: 20.00   Types: Cigarettes   Start date: 01/15/1983   Quit date: 11/09/2020   Years since quitting: 0.6  Smokeless Tobacco Never   BP Readings from Last 3 Encounters:  04/16/21 (!) 151/75  02/20/21 (!) 152/67  02/05/21 117/78   Pulse Readings from Last 3 Encounters:  04/16/21 84  02/20/21 89  02/05/21 92   Wt Readings from Last 3 Encounters:  02/20/21 151 lb (68.5 kg)  02/05/21 151 lb (68.5 kg)  12/25/20 152 lb 1.6 oz (69 kg)    Assessment: Review of patient past medical history, allergies, medications, health status, including review of consultants reports, laboratory and other test data, was performed as part of comprehensive evaluation and provision of chronic care management services.   SDOH:  (Social Determinants of Health) assessments and interventions performed:    CCM Care Plan  Allergies  Allergen Reactions   Banana Anaphylaxis   Codeine Nausea And Vomiting and Other (See Comments)    PROJECTILE VOMITING   Stevia [Stevioside] Swelling and Other (See Comments)    Face, tongue, and eye swelling    Actos [Pioglitazone] Swelling   Benicar [Olmesartan Medoxomil] Other (See Comments)    Feel bad   Chantix [Varenicline] Other (See Comments)    "Worked opposite."   Diovan [Valsartan] Other (See Comments)    Feel bad   Lipitor [Atorvastatin] Other (See Comments)    REACTION:  Joint pain   Spinach Other (See Comments)    Stomach problems   Vicodin [Hydrocodone-Acetaminophen] Nausea And Vomiting   Zocor [Simvastatin] Other (See Comments)    REACTION:  "made liver function incorrectly"   Ace Inhibitors Cough   Celebrex [Celecoxib] Rash   Latex Rash and Other (See Comments)    REACTION: red rash   Metformin And Related     GI - upset    Penicillins Rash and Other (See Comments)    Has patient had a PCN reaction causing immediate  rash, facial/tongue/throat swelling, SOB or lightheadedness with hypotension: No Has patient had a PCN reaction causing severe rash involving mucus membranes or skin necrosis: No Has patient had a PCN reaction that required hospitalization: No- MD office Has patient had a PCN reaction occurring within the last 10 years: No If all of the above answers are "NO", then may proceed with Cephalosporin use.    Sulfonamide Derivatives Rash    Medications Reviewed Today     Reviewed by Beryle Lathe, [facility] (Pharmacist) on 06/19/21 at 1429  Med List Status: <None>   Medication Order Taking? Sig Documenting Provider Last Dose Status Informant  albuterol (VENTOLIN HFA) 108 (90 Base) MCG/ACT inhaler [id number] Yes INHALE 2 PUFFS EVERY SIX HOURS AS  NEEDED FOR WHEEZING OR SHORTNESS OF BREATH Janora Norlander, DO Taking Active            Med Note Beryle Lathe   Tue Apr 16, 2021  8:42 AM) Only takes 1-2 times per week in the Fall/Summer  Alirocumab (PRALUENT) 75 MG/ML SOAJ [id number] No Inject 75 mg into the skin every 14 (fourteen) days.  Patient not taking: No sig reported   Noreene Larsson, NP Not Taking Active   amLODipine (NORVASC) 10 MG tablet [id number] Yes TAKE 1 TABLET BY MOUTH EVERY DAY Noreene Larsson, NP Taking Active   aspirin 325 MG tablet [id number] Yes Take 325 mg by mouth daily. [provider] Taking Active   Carboxymethylcellulose Sodium 1 % GEL [id number] Yes Place 1 drop into both eyes at bedtime. [provider] Taking Active Self  chlorpheniramine (CHLOR-TRIMETON) 4 MG tablet [id number] Yes Take 2-4 mg by mouth at bedtime as needed for allergies. [provider] Taking Active Self  CINNAMON PO [id number] Yes Take 1,000 mg by mouth daily as needed (for blood sugars).  [provider] Taking Active Self  clopidogrel (PLAVIX) 75 MG tablet [id number] Yes TAKE ONE TABLET BY MOUTH DAILY (PLEASE USE AUROBINDO BRAND, PT HD FOR ALLERGY TO REDDY).  Hold for 5 days Barton Dubois, MD Taking Active   JARDIANCE 10 MG TABS tablet [id number] Yes TAKE 1 TABLET BY MOUTH ONCE DAILY (TO replace farxiga) [provider] Taking Active            Med Note Jim Like Apr 25, 2021  4:17 PM) Frances Maywood from [place] Patient Assistance Program  pantoprazole (PROTONIX) 40 MG tablet [id number] Yes TAKE 1 TABLET BY MOUTH DAILY BEFORE BREAKFAST Rogene Houston, MD Taking Active     Discontinued 11/14/11 1453 (Error)   sodium chloride (OCEAN) 0.65 % SOLN nasal spray [id number] Yes Place 1 spray into both nostrils as needed for congestion. [provider] Taking Active Self  ursodiol (ACTIGALL) 300 MG capsule [id number] Yes Take 1 capsule (300 mg total) by mouth 3 (three) times daily. 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   History of iron deficiency anemia 12/22/2019   IDA (iron deficiency anemia) 07/04/2019   Hepatic fibrosis 07/04/2019   Abnormal transaminases 07/04/2019   Hyperlipidemia associated with type 2 diabetes mellitus ([place]) 03/25/2019   Rectal bleeding    Iron deficiency anemia 08/11/2018   Esophageal dysphagia 07/28/2018   Stroke ([place])    Elevated liver function tests 10/22/2017   Anxiety and depression 10/21/2017   Abdominal aortic atherosclerosis ([place]) 10/21/2017   Type 2 diabetes mellitus with hyperlipidemia ([place]) 09/06/2015   Type 2 diabetes mellitus with peripheral neuropathy ([place]) 11/27/2014   CVA (cerebral vascular accident) ([place]) 02/27/2012   Cerebral aneurysm 02/27/2012   Tobacco abuse 02/27/2012   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 02/27/2012   Gait instability 02/27/2012   Vision disturbance following cerebrovascular accident 02/27/2012   Dyspnea 11/14/2011   Thyroid disease 10/12/2008   Diabetes  ([place]) 08/29/2008   Hyperlipidemia 08/29/2008   Obesity, unspecified 08/29/2008   COPD (chronic obstructive pulmonary disease) ([place]) 08/29/2008   WEIGHT LOSS 08/29/2008   DIARRHEA 08/29/2008   Hypertension associated with diabetes ([place]) 08/28/2008   ASTHMA  08/28/2008   ESOPHAGEAL STRICTURE 08/28/2008   GERD (gastroesophageal reflux disease) 08/28/2008   HIATAL HERNIA 08/28/2008   Diverticulosis of colon 08/28/2008   COLONIC POLYPS, HX OF 08/28/2008    Immunization History  Administered Date(s) Administered   Influenza Whole 06/08/2010   Influenza, High Dose Seasonal PF 06/17/2019   Influenza,inj,Quad PF,6+ Mos 07/07/2013, 07/06/2014, 07/17/2017   Influenza-Unspecified 08/05/2016   Pneumococcal Conjugate-13 11/25/2013   Pneumococcal Polysaccharide-23 09/09/2007    Conditions to be addressed/monitored: HTN, HLD, and DMII  Care Plan : Medication Management  Updates made by Beryle Lathe, [place] since 06/19/2021 12:00 AM     Problem: Diabetes, Hyperlipidemia/History of Stroke, Hypertension   Priority: High  Onset Date: 04/11/2021     Long-Range Goal: Disease Progression Prevention   Start Date: 04/11/2021  Expected End Date: 07/10/2021  Recent Progress: On track  Priority: High  Note:   Current Barriers:  Unable to achieve control of hyperlipidemia  Suboptimal therapeutic regimen for hyperlipidemia  Pharmacist Clinical Goal(s):  Over the next 90 days, patient will achieve control of hyperlipidemia as evidenced by lower LDL 4-12 weeks after initiation new LDL lowering therapy adhere to plan to optimize therapeutic regimen for hyperlipidemia as evidenced by report of adherence to recommended medication management changes contact provider office for questions/concerns as evidenced notation of same in electronic health record through collaboration with PharmD and provider.   Interventions: 1:1 collaboration with Noreene Larsson, NP regarding development and update of  comprehensive plan of care as evidenced by provider attestation and co-signature Inter-disciplinary care team collaboration (see longitudinal plan of care) Comprehensive medication review performed; medication list updated in electronic medical record  Diabetes: Current medications: empagliflozin (Jardiance) 10 mg by mouth once daily (receives from [employer]) Intolerances:  pioglitazone (swelling) , metformin (GI upset) Taking medications as directed: yes Side effects thought to be attributed to current medication regimen: no Denies hypoglycemic/hyperglycemic symptoms Most recent home BG values of 90-100s fasting without hypoglycemia; blood glucose 110 2 hours after breakfast per patient report on 06/18/21 Controlled; Most recent A1c at goal of <7% per ADA guidelines Continue empagliflozin 10 mg by mouth once daily Check A1c, fasting lipid panel, and BMP at next PCP visit  History of Stroke/Hyperlipidemia: Current medications: clopidogrel 75 mg by mouth daily, aspirin 325 mg by mouth daily  Patient reports that she was previously taking aspirin 81 mg daily but a doctor told her to increase it to 325 mg daily  Patient reports that she quit smoking in March 2022 Intolerances:  multiple statins (joint pain and liver function tests elevation 2X ULN) and ezetimibe Taking medications as directed: yes Side effects thought to be attributed to current medication regimen: no Uncontrolled; LDL above goal of <55 due to extreme risk given established clinical ASCVD + diabetes per 2020 AACE/ACE guidelines and TG at goal of <150 per 2020 AACE/ACE guidelines Patient agreeable to move forward with PCSK9 inhibitor therapy. Will plan to start Praluent 75 mg subcutaneously every 14 days. Patient assistance application was completed and faxed on 06/18/21. Will follow-up on approval.  Received notification from [employer] program that patient needs to submit proof of income and proof of at least $500 of expenses at  the pharmacy this year.  Called [facility] and received fax of expense summary from this calendar year.  Patient was notified to bring proof of income such as social security income statement for her and her spouse. Will fax these forms to [employer] program.  Patient would like to complete  first injection in office due to history of allergic reactions to foods/medications. Patient will call and setup follow-up appointment when she receives the medication Repeat lipid panel 4-12 weeks after initiation and continued use. Can titrate dose to 150 mg subcutaneously every 14 days if necessary. Statin intolerance noted. No statin due to serious side effects (ex. Myalgias with at least 2 different statins)  Unsure of the benefit of aspirin 325 mg daily vs the risk of bleeding. Will ask PCP to further assess aspirin 325 mg vs 81 mg at next PCP visit and consider referral to Neurology if unable to determine by primary care.   Hypertension: Current medications: amlodipine 10 mg by mouth once daily Intolerances:  ACEi (cough), ARBs (feels bad) Taking medications as directed: yes Side effects thought to be attributed to current medication regimen: no Denies dizziness, lightheadedness, blurred vision, and headache Most recent home blood pressure readings per patient report: 130s/70s Blood pressure control is unclear. Blood pressure is high at office visits, but patient reports normal blood pressures when monitors outside of office. Blood pressure goal is <130/80 mmHg per 2017 AHA/ACC guidelines. Continue amlodipine 10 mg by mouth once daily If additional blood pressure lowering is needed, consider adding one of the following: thiazide (chlorthalidone 12.5 mg by mouth daily), beta blocker (carvedilol 3.125 mg by mouth daily), or hydralazine 25 mg by mouth three time daily.   Patient Goals/Self-Care Activities Over the next 90 days, patient will:  - check glucose at least once daily, document, and provide at  future appointments check blood pressure at least once daily, document, and provide at future appointments collaborate with provider on medication access solutions  Follow Up Plan: Patient given contact information and instructed to call when she receives the [place]. Patient will call to schedule appointment for first injection.      Medication Assistance:  see care plan for more details  Patient's preferred pharmacy is:  [facility], [place] [address] [place] [address] Phone: [phone] Fax: [phone]  Follow Up:  Patient agrees to Care Plan and Follow-up.  Plan:  Patient given contact information and instructed to call when she receives the [place]. Patient will call to schedule appointment for first injection.   Kennon Holter, PharmD Clinical Pharmacist [facility] [phone]

## 2021-06-25 ENCOUNTER — Ambulatory Visit (INDEPENDENT_AMBULATORY_CARE_PROVIDER_SITE_OTHER): Payer: Medicare Other | Admitting: Internal Medicine

## 2021-06-25 ENCOUNTER — Other Ambulatory Visit: Payer: Self-pay

## 2021-06-25 ENCOUNTER — Encounter (INDEPENDENT_AMBULATORY_CARE_PROVIDER_SITE_OTHER): Payer: Self-pay | Admitting: Internal Medicine

## 2021-06-25 ENCOUNTER — Telehealth: Payer: Medicare Other

## 2021-06-25 ENCOUNTER — Encounter (HOSPITAL_COMMUNITY): Payer: Self-pay | Admitting: Hematology

## 2021-06-25 VITALS — BP 153/77 | HR 88 | Temp 98.4°F | Ht 62.0 in | Wt 149.9 lb

## 2021-06-25 DIAGNOSIS — K219 Gastro-esophageal reflux disease without esophagitis: Secondary | ICD-10-CM | POA: Diagnosis not present

## 2021-06-25 DIAGNOSIS — K743 Primary biliary cirrhosis: Secondary | ICD-10-CM

## 2021-06-25 NOTE — Patient Instructions (Signed)
Please ask your primary care provider to also add LFTs your planned blood work to be done in November 2022.

## 2021-06-25 NOTE — Progress Notes (Addendum)
Presenting complaint;  Follow-up for primary biliary cholangitis and GERD.  She also has a history of iron deficiency anemia  Database and subjective:  Patient is 80 year old Caucasian female who has history of iron deficiency anemia felt to be due to GI bleeding for which she underwent EGD colonoscopy and small bowel given capsule and December 2019.  No bleeding lesion identified.  She has required intermittent parenteral iron with suppression of Dr. Delton Coombes.  Hemoglobin was low in January 2022 but has been normal since February 2020. 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  Last ultrasound was 6 months ago which suggested changes of cirrhosis but he did not have splenomegaly or ascites.  She says she is doing well.  Her appetite is good.  Heartburn is well controlled with pantoprazole.  She denies dysphagia nausea vomiting or abdominal pain.  She has pruritus occasionally but is never intractable.  Bowels move daily or every other day..  She denies melena or rectal bleeding. She says she does not use inhaler very often.  Current Medications: Outpatient Encounter Medications as of 06/25/2021  Medication Sig   albuterol (VENTOLIN HFA) 108 (90 Base) MCG/ACT inhaler INHALE 2 PUFFS EVERY SIX HOURS AS NEEDED FOR WHEEZING OR SHORTNESS OF BREATH   amLODipine (NORVASC) 10 MG tablet TAKE 1 TABLET BY MOUTH EVERY DAY   aspirin 325 MG tablet Take 325 mg by mouth daily.   Carboxymethylcellulose Sodium 1 % GEL Place 1 drop into both eyes at bedtime.   chlorpheniramine (CHLOR-TRIMETON) 4 MG tablet Take 2-4 mg by mouth at bedtime as needed for allergies.   CINNAMON PO Take 1,000 mg by mouth daily as needed (for blood sugars).    clopidogrel (PLAVIX) 75 MG tablet TAKE ONE TABLET BY MOUTH DAILY (PLEASE USE AUROBINDO BRAND, PT HD FOR ALLERGY TO REDDY). Hold for 5 days   JARDIANCE 10 MG TABS tablet  TAKE 1 TABLET BY MOUTH ONCE DAILY (TO replace farxiga)   OVER THE COUNTER MEDICATION Grape seed oil capsule one daily   OVER THE COUNTER MEDICATION Herb for blood sugar ( unsure of name)   pantoprazole (PROTONIX) 40 MG tablet TAKE 1 TABLET BY MOUTH DAILY BEFORE BREAKFAST   sodium chloride (OCEAN) 0.65 % SOLN nasal spray Place 1 spray into both nostrils as needed for congestion.   ursodiol (ACTIGALL) 300 MG capsule Take 1 capsule (300 mg total) by mouth 3 (three) times daily.   [DISCONTINUED] Alirocumab (PRALUENT) 75 MG/ML SOAJ Inject 75 mg into the skin every 14 (fourteen) days. (Patient not taking: No sig reported)   [DISCONTINUED] ramipril (ALTACE) 10 MG capsule Take 10 mg by mouth daily.     No facility-administered encounter medications on file as of 06/25/2021.     Objective: Blood pressure (!) 153/77, pulse 88, temperature 98.4 F (36.9 C), temperature source Oral, height _0  (1.575 m), weight 149 lb 14.4 oz (68 kg). Patient is alert and in no acute distress. She does not have asterixis. Conjunctiva is pink. Sclera is nonicteric Oropharyngeal mucosa is normal. No neck masses or thyromegaly noted. Cardiac exam with regular rhythm normal S1 and S2. No murmur or gallop noted. Lungs are clear to auscultation. Abdomen is symmetrical.  She has no midline scar.  On palpation abdomen is soft and nontender.  Spleen is nonpalpable.  Liver edge is indistinct in the right costal margin.  Liver does not appear to be enlarged. No LE edema  or clubbing noted.  Labs/studies Results:   CBC Latest Ref Rng & Units 02/14/2021 11/05/2020 10/03/2020  WBC 4.0 - 10.5 K/uL 5.5 5.3 6.0  Hemoglobin 12.0 - 15.0 g/dL 15.8(H) 13.0 10.3(L)  Hematocrit 36.0 - 46.0 % 47.3(H) 41.3 33.8(L)  Platelets 150 - 400 K/uL 199 252 331    CMP Latest Ref Rng & Units 06/10/2021 02/14/2021 11/05/2020  Glucose 70 - 99 mg/dL - 131(H) 125(H)  BUN 8 - 23 mg/dL - 10 12  Creatinine 0.44 - 1.00 mg/dL 0.80 0.57 0.81  Sodium 135 -  145 mmol/L - 133(L) 137  Potassium 3.5 - 5.1 mmol/L - 3.7 4.2  Chloride 98 - 111 mmol/L - 100 101  CO2 22 - 32 mmol/L - 27 22  Calcium 8.9 - 10.3 mg/dL - 9.2 10.0  Total Protein 6.5 - 8.1 g/dL - 7.3 7.2  Total Bilirubin 0.3 - 1.2 mg/dL - 1.3(H) 0.4  Alkaline Phos 38 - 126 U/L - 98 146(H)  AST 15 - 41 U/L - 27 27  ALT 0 - 44 U/L - 28 23    Hepatic Function Latest Ref Rng & Units 02/14/2021 11/05/2020 10/03/2020  Total Protein 6.5 - 8.1 g/dL 7.3 7.2 7.1  Albumin 3.5 - 5.0 g/dL 4.1 4.4 4.4  AST 15 - 41 U/L _0 ALT 0 - 44 U/L _1 Alk Phosphatase 38 - 126 U/L 98 146(H) 138(H)  Total Bilirubin 0.3 - 1.2 mg/dL 1.3(H) 0.4 0.5  Bilirubin, Direct 0.0 - 0.2 mg/dL - - -     Assessment:  #1.  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  She has been maintained on Urso and transaminases as well as alkaline phosphatase have returned to normal.  Ultrasound 6 months ago suggested contour changes to liver consistent with cirrhosis but she does not have splenomegaly or ascites.  Platelet count also has remained normal.  Last EGD was in December 2019 for dysphagia and iron deficiency anemia.  I do not feel that there is any indication for esophagogastroduodenoscopy looking for esophageal varices as index of suspicion is very low given normal size spleen and platelet count.  #2.  GERD.  Symptoms well controlled with PPI.  #3.  History of iron deficiency anemia.  She had EGD colonoscopy and small bowel given capsule study in December 2019 and no bleeding lesion was found but she had multiple colonic polyps..  She has been receiving parenteral iron intermittently by oncology clinic.  H&H is now at upper range of normal.  #4.  History of multiple colonic adenomas noted on last colonoscopy of December 2019.  She was supposed to have follow-up exam in 6 months.  Letter was sent to clinic.  Not sure why was never  scheduled but could have been due to [place].  Patient will call when she is ready.    Plan:  Patient will have LFTs next month when she has blood work by her primary provider. Patient will call office if she has melena or rectal bleeding. Ultrasound in April 2023. Office visit in 6 months.

## 2021-06-27 ENCOUNTER — Telehealth: Payer: Self-pay | Admitting: Pharmacist

## 2021-06-27 ENCOUNTER — Ambulatory Visit: Payer: Medicare Other | Admitting: Pharmacist

## 2021-06-27 DIAGNOSIS — G720 Drug-induced myopathy: Secondary | ICD-10-CM

## 2021-06-27 DIAGNOSIS — E1159 Type 2 diabetes mellitus with other circulatory complications: Secondary | ICD-10-CM

## 2021-06-27 DIAGNOSIS — E7849 Other hyperlipidemia: Secondary | ICD-10-CM

## 2021-06-27 DIAGNOSIS — E785 Hyperlipidemia, unspecified: Secondary | ICD-10-CM

## 2021-06-27 DIAGNOSIS — I152 Hypertension secondary to endocrine disorders: Secondary | ICD-10-CM

## 2021-06-27 DIAGNOSIS — I639 Cerebral infarction, unspecified: Secondary | ICD-10-CM

## 2021-06-27 DIAGNOSIS — E1169 Type 2 diabetes mellitus with other specified complication: Secondary | ICD-10-CM

## 2021-06-27 NOTE — Chronic Care Management (AMB) (Signed)
Chronic Care Management Pharmacy Note  06/27/2021 Name:  Elizabeth [name] MRN:  [id number] DOB:  [birth year]  Summary:  History of Stroke/Hyperlipidemia: Current medications: clopidogrel 75 mg by mouth daily, aspirin 325 mg by mouth daily  Patient reports that she was previously taking aspirin 81 mg daily but a doctor told her to increase it to 325 mg daily. Unsure of the benefit of aspirin 325 mg daily vs the risk of bleeding. Will ask PCP to further assess aspirin 325 mg vs 81 mg at next PCP visit and consider referral to Neurology if unable to determine by primary care. Patient previously agreeable to move forward with PCSK9 inhibitor therapy. Patient assistance application for Praluent 75 mg subcutaneously every 14 days was submitted on 06/18/21; however, it is currently on hold. Patient has been instructed to bring in proof of income for her and her spouse as well as proof of at least $500 expense at the pharmacy this calendar year. After further discussion today, patient continues to have concerns regarding potential adverse events from PCSK9i therapy and wants to delay therapy at this time to think about it further. Patient continues to disregard the medical advise provided. Will follow-up again in 6 weeks.  Statin intolerance noted. No statin due to serious side effects (ex. Myalgias with at least 2 different statins)   Subjective: Elizabeth [name] is an 80 y.o. year old female who is a primary patient of Noreene Larsson, NP.  The CCM team was consulted for assistance with disease management and care coordination needs.    Engaged with patient by telephone for follow up visit in response to provider referral for pharmacy case management and/or care coordination services.   Consent to Services:  The patient was given information about Chronic Care Management services, agreed to services, and gave verbal consent prior to initiation of services.  Please see initial visit note for detailed  documentation.   Patient Care Team: Noreene Larsson, NP as PCP - General (Nurse Practitioner) Particia Nearing, OD (Optometry) Rogene Houston, MD as Consulting Physician (Gastroenterology) Luanne Bras, MD as Consulting Physician (Interventional Radiology) Beryle Lathe, [facility] as Pharmacist  Objective:  Lab Results  Component Value Date   CREATININE 0.80 06/10/2021   CREATININE 0.57 02/14/2021   CREATININE 0.81 11/05/2020    Lab Results  Component Value Date   HGBA1C 6.2 (H) 10/03/2020   Last diabetic Eye exam:  Lab Results  Component Value Date/Time   HMDIABEYEEXA No Retinopathy 05/04/2014 12:00 AM    Last diabetic Foot exam: No results found for: HMDIABFOOTEX      Component Value Date/Time   CHOL 218 (H) 10/03/2020 0838   CHOL 242 (H) 08/09/2013 0828   TRIG 105 10/03/2020 0838   TRIG 180 (H) 09/06/2015 0947   TRIG 92 08/09/2013 0828   HDL 45 10/03/2020 0838   HDL 43 09/06/2015 0947   HDL 53 08/09/2013 0828   CHOLHDL 6.5 (H) 03/30/2019 0943   CHOLHDL 7.0 01/21/2017 1015   VLDL 27 01/21/2017 1015   LDLCALC 154 (H) 10/03/2020 0838   LDLCALC 230 (H) 02/24/2014 0837   LDLCALC 171 (H) 08/09/2013 0828    Hepatic Function Latest Ref Rng & Units 02/14/2021 11/05/2020 10/03/2020  Total Protein 6.5 - 8.1 g/dL 7.3 7.2 7.1  Albumin 3.5 - 5.0 g/dL 4.1 4.4 4.4  AST 15 - 41 U/L '27 27 28  ' ALT 0 - 44 U/L '28 23 22  ' Alk Phosphatase 38 - 126 U/L 98  146(H) 138(H)  Total Bilirubin 0.3 - 1.2 mg/dL 1.3(H) 0.4 0.5  Bilirubin, Direct 0.0 - 0.2 mg/dL - - -    Lab Results  Component Value Date/Time   TSH 1.790 10/03/2020 08:38 AM   TSH 1.850 03/30/2019 09:43 AM   FREET4 1.24 10/03/2020 08:38 AM   FREET4 1.2 10/26/2008 10:16 AM    CBC Latest Ref Rng & Units 02/14/2021 11/05/2020 10/03/2020  WBC 4.0 - 10.5 K/uL 5.5 5.3 6.0  Hemoglobin 12.0 - 15.0 g/dL 15.8(H) 13.0 10.3(L)  Hematocrit 36.0 - 46.0 % 47.3(H) 41.3 33.8(L)  Platelets 150 - 400 K/uL 199 252 331    Lab  Results  Component Value Date/Time   VD25OH 47.5 11/10/2018 11:48 AM   VD25OH 30.6 06/29/2018 12:06 PM    Clinical ASCVD: Yes  The ASCVD Risk score (Arnett DK, et al., 2019) failed to calculate for the following reasons:   The patient has a prior MI or stroke diagnosis    Social History   Tobacco Use  Smoking Status Former   Packs/day: 1.00   Years: 20.00   Pack years: 20.00   Types: Cigarettes   Start date: 01/15/1983   Quit date: 11/09/2020   Years since quitting: 0.6  Smokeless Tobacco Never   BP Readings from Last 3 Encounters:  06/25/21 (!) 153/77  04/16/21 (!) 151/75  02/20/21 (!) 152/67   Pulse Readings from Last 3 Encounters:  06/25/21 88  04/16/21 84  02/20/21 89   Wt Readings from Last 3 Encounters:  06/25/21 149 lb 14.4 oz (68 kg)  02/20/21 151 lb (68.5 kg)  02/05/21 151 lb (68.5 kg)    Assessment: Review of patient past medical history, allergies, medications, health status, including review of consultants reports, laboratory and other test data, was performed as part of comprehensive evaluation and provision of chronic care management services.   SDOH:  (Social Determinants of Health) assessments and interventions performed:    CCM Care Plan  Allergies  Allergen Reactions   Banana Anaphylaxis   Codeine Nausea And Vomiting and Other (See Comments)    PROJECTILE VOMITING   Stevia [Stevioside] Swelling and Other (See Comments)    Face, tongue, and eye swelling    Actos [Pioglitazone] Swelling   Benicar [Olmesartan Medoxomil] Other (See Comments)    Feel bad   Chantix [Varenicline] Other (See Comments)    "Worked opposite."   Diovan [Valsartan] Other (See Comments)    Feel bad   Lipitor [Atorvastatin] Other (See Comments)    REACTION:  Joint pain   Spinach Other (See Comments)    Stomach problems   Vicodin [Hydrocodone-Acetaminophen] Nausea And Vomiting   Zocor [Simvastatin] Other (See Comments)    REACTION:  "made liver function incorrectly"    Ace Inhibitors Cough   Celebrex [Celecoxib] Rash   Latex Rash and Other (See Comments)    REACTION: red rash   Metformin And Related     GI - upset    Penicillins Rash and Other (See Comments)    Has patient had a PCN reaction causing immediate rash, facial/tongue/throat swelling, SOB or lightheadedness with hypotension: No Has patient had a PCN reaction causing severe rash involving mucus membranes or skin necrosis: No Has patient had a PCN reaction that required hospitalization: No- MD office Has patient had a PCN reaction occurring within the last 10 years: No If all of the above answers are "NO", then may proceed with Cephalosporin use.    Sulfonamide Derivatives Rash    Medications Reviewed  Today     Reviewed by Beryle Lathe, [facility] (Pharmacist) on 06/27/21 at [place] List Status: <None>   Medication Order Taking? Sig Documenting Provider Last Dose Status Informant  albuterol (VENTOLIN HFA) 108 (90 Base) MCG/ACT inhaler [id number] Yes INHALE 2 PUFFS EVERY SIX HOURS AS NEEDED FOR WHEEZING OR SHORTNESS OF BREATH Janora Norlander, DO Taking Active            Med Note Beryle Lathe   Tue Apr 16, 2021  8:42 AM) Only takes 1-2 times per week in the Fall/Summer  amLODipine (NORVASC) 10 MG tablet [id number] Yes TAKE 1 TABLET BY MOUTH EVERY DAY Noreene Larsson, NP Taking Active   aspirin 325 MG tablet [id number] Yes Take 325 mg by mouth daily. [provider] Taking Active   Carboxymethylcellulose Sodium 1 % GEL [id number] Yes Place 1 drop into both eyes at bedtime. [provider] Taking Active Self  chlorpheniramine (CHLOR-TRIMETON) 4 MG tablet [id number] Yes Take 2-4 mg by mouth at bedtime as needed for allergies. [provider] Taking Active Self  CINNAMON PO [id number] Yes Take 1,000 mg by mouth daily as needed (for blood sugars).  [provider] Taking Active Self  clopidogrel (PLAVIX) 75 MG tablet [id number] Yes TAKE ONE TABLET BY  MOUTH DAILY (PLEASE USE AUROBINDO BRAND, PT HD FOR ALLERGY TO [place]). Hold for 5 days Barton Dubois, MD Taking Active   JARDIANCE 10 MG TABS tablet [id number] Yes TAKE 1 TABLET BY MOUTH ONCE DAILY (TO replace farxiga) [provider] Taking Active            Med Note Jim Like Apr 25, 2021  4:17 PM) Frances Maywood from [place] [id number] Yes Grape seed oil capsule one daily [provider] Taking Active   [place] [id number] Yes Herb for blood sugar ( unsure of name) [provider] Taking Active   pantoprazole (PROTONIX) 40 MG tablet [id number] Yes TAKE 1 TABLET BY MOUTH DAILY BEFORE BREAKFAST Rogene Houston, MD Taking Active     Discontinued 11/14/11 1453 (Error)   sodium chloride (OCEAN) 0.65 % SOLN nasal spray [id number] Yes Place 1 spray into both nostrils as needed for congestion. [provider] Taking Active Self  ursodiol (ACTIGALL) 300 MG capsule [id number] Yes Take 1 capsule (300 mg total) by mouth 3 (three) times daily. 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   History of iron deficiency anemia 12/22/2019   IDA (iron deficiency anemia) 07/04/2019   Hepatic fibrosis 07/04/2019   Abnormal transaminases 07/04/2019   Hyperlipidemia associated with type 2 diabetes mellitus ([place]) 03/25/2019   Rectal bleeding    Iron deficiency anemia 08/11/2018   Esophageal dysphagia 07/28/2018   Stroke ([place])    Elevated liver function tests 10/22/2017   Anxiety and depression 10/21/2017   Abdominal aortic atherosclerosis ([place]) 10/21/2017   Type 2 diabetes mellitus with hyperlipidemia ([place]) 09/06/2015   Type 2 diabetes mellitus with peripheral neuropathy ([place]) 11/27/2014   CVA  (cerebral vascular accident) ([place]) 02/27/2012   Cerebral aneurysm 02/27/2012   Tobacco abuse 02/27/2012   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 02/27/2012   Gait instability 02/27/2012   Vision disturbance  following cerebrovascular accident 02/27/2012   Dyspnea 11/14/2011   Thyroid disease 10/12/2008   Diabetes ([place]) 08/29/2008   Hyperlipidemia 08/29/2008   Obesity, unspecified 08/29/2008   COPD (chronic obstructive pulmonary disease) ([place]) 08/29/2008   WEIGHT LOSS 08/29/2008   DIARRHEA 08/29/2008   Hypertension associated with diabetes ([place]) 08/28/2008   ASTHMA 08/28/2008   ESOPHAGEAL STRICTURE 08/28/2008   GERD (gastroesophageal reflux disease) 08/28/2008   HIATAL HERNIA 08/28/2008   Diverticulosis of colon 08/28/2008   COLONIC POLYPS, HX OF 08/28/2008    Immunization History  Administered Date(s) Administered   Influenza Whole 06/08/2010   Influenza, High Dose Seasonal PF 06/17/2019   Influenza,inj,Quad PF,6+ Mos 07/07/2013, 07/06/2014, 07/17/2017   Influenza-Unspecified 08/05/2016   Pneumococcal Conjugate-13 11/25/2013   Pneumococcal Polysaccharide-23 09/09/2007    Conditions to be addressed/monitored: HTN, HLD, and DMII  Care Plan : Medication Management  Updates made by Beryle Lathe, [place] since 06/27/2021 12:00 AM     Problem: Diabetes, Hyperlipidemia/History of Stroke, Hypertension   Priority: High  Onset Date: 04/11/2021     Long-Range Goal: Disease Progression Prevention   Start Date: 04/11/2021  Expected End Date: 07/10/2021  Recent Progress: On track  Priority: High  Note:   Current Barriers:  Unable to achieve control of hyperlipidemia  Suboptimal therapeutic regimen for hyperlipidemia  Pharmacist Clinical Goal(s):  Over the next 90 days, patient will achieve control of hyperlipidemia as evidenced by lower LDL 4-12 weeks after initiation new LDL lowering therapy adhere to plan to optimize therapeutic regimen for hyperlipidemia as evidenced by report of  adherence to recommended medication management changes contact provider office for questions/concerns as evidenced notation of same in electronic health record through collaboration with PharmD and provider.   Interventions: 1:1 collaboration with Noreene Larsson, NP regarding development and update of comprehensive plan of care as evidenced by provider attestation and co-signature Inter-disciplinary care team collaboration (see longitudinal plan of care) Comprehensive medication review performed; medication list updated in electronic medical record  Diabetes: Current medications: empagliflozin (Jardiance) 10 mg by mouth once daily (receives from [employer]) Intolerances:  pioglitazone (swelling) , metformin (GI upset) Taking medications as directed: yes Side effects thought to be attributed to current medication regimen: no Denies hypoglycemic/hyperglycemic symptoms Most recent home BG values of 90-100s fasting without hypoglycemia; blood glucose 110 2 hours after breakfast per patient report on 06/18/21 Controlled; Most recent A1c at goal of <7% per ADA guidelines Continue empagliflozin 10 mg by mouth once daily Check A1c, fasting lipid panel, and BMP at next PCP visit  History of Stroke/Hyperlipidemia: Current medications: clopidogrel 75 mg by mouth daily, aspirin 325 mg by mouth daily  Patient reports that she was previously taking aspirin 81 mg daily but a doctor told her to increase it to 325 mg daily  Patient reports that she quit smoking in March 2022 Intolerances:  multiple statins (joint pain and liver function tests elevation 2X ULN) and ezetimibe Taking medications as directed: yes Side effects thought to be attributed to current medication regimen: no Uncontrolled; LDL above goal of <55 due to extreme risk given established clinical ASCVD + diabetes per 2020 AACE/ACE guidelines and TG at goal of <150 per 2020 AACE/ACE guidelines Patient previously agreeable to move forward with  PCSK9 inhibitor therapy. Patient assistance application for Praluent 75 mg subcutaneously every 14 days was submitted on 06/18/21; however, it is currently on hold. Patient has been instructed to bring in proof of income for her and her spouse as well as proof of  at least $500 expense at the pharmacy this calendar year. After further discussion today, patient continues to have concerns regarding potential adverse events from PCSK9i therapy and wants to delay therapy at this time to think about it further. Patient continues to disregard the medical advise provided. Will follow-up again in 6 weeks.  Statin intolerance noted. No statin due to serious side effects (ex. Myalgias with at least 2 different statins)  Unsure of the benefit of aspirin 325 mg daily vs the risk of bleeding. Will ask PCP to further assess aspirin 325 mg vs 81 mg at next PCP visit and consider referral to Neurology if unable to determine by primary care.   Hypertension: Current medications: amlodipine 10 mg by mouth once daily Intolerances:  ACEi (cough), ARBs (feels bad) Taking medications as directed: yes Side effects thought to be attributed to current medication regimen: no Denies dizziness, lightheadedness, blurred vision, and headache Most recent home blood pressure readings per patient report: 130s/70s Blood pressure control is unclear. Blood pressure is high at office visits, but patient reports normal blood pressures when monitors outside of office. Blood pressure goal is <130/80 mmHg per 2017 AHA/ACC guidelines. Continue amlodipine 10 mg by mouth once daily If additional blood pressure lowering is needed, consider adding one of the following: thiazide (chlorthalidone 12.5 mg by mouth daily), beta blocker (carvedilol 3.125 mg by mouth daily), or hydralazine 25 mg by mouth three time daily.   Patient Goals/Self-Care Activities Over the next 90 days, patient will:  Check glucose at least once daily, document, and provide  at future appointments Check blood pressure at least once daily, document, and provide at future appointments Collaborate with provider on medication access solutions.  Follow Up Plan: telephone visit with care management team 08/15/21      Medication Assistance:  Patient assistance application for Praluent on hold. Patient needs to bring income verification and proof that she has spent at least $500 this calendar year to be approved. [employer] for Jardiance approved through 09/07/21  Patient's preferred pharmacy is:  [facility], [place] [address] [place] [address] Phone: [phone] Fax: [phone]  Follow Up:  Patient agrees to Care Plan and Follow-up.  Plan: Telephone follow up appointment with care management team member scheduled for:  08/15/21  Kennon Holter, PharmD Clinical Pharmacist [facility] Primary Care [phone]

## 2021-06-27 NOTE — Patient Instructions (Signed)
Elizabeth [name],  It was great to talk to you today!  Please call me with any questions or concerns.   Visit Information  PATIENT GOALS:  Goals Addressed             This Visit's Progress    Medication Management       Patient Goals/Self-Care Activities Over the next 90 days, patient will:  Check glucose at least once daily, document, and provide at future appointments Check blood pressure at least once daily, document, and provide at future appointments Collaborate with provider on medication access solutions              The patient verbalized understanding of instructions, educational materials, and care plan provided today and declined offer to receive copy of patient instructions, educational materials, and care plan.   Telephone follow up appointment with care management team member scheduled for:08/15/21  Kennon Holter, PharmD Clinical Pharmacist [facility] Primary Care [phone]

## 2021-06-27 NOTE — Telephone Encounter (Signed)
error 

## 2021-07-01 ENCOUNTER — Ambulatory Visit: Payer: HMO | Admitting: Cardiology

## 2021-07-08 DIAGNOSIS — I152 Hypertension secondary to endocrine disorders: Secondary | ICD-10-CM

## 2021-07-08 DIAGNOSIS — I639 Cerebral infarction, unspecified: Secondary | ICD-10-CM | POA: Diagnosis not present

## 2021-07-08 DIAGNOSIS — E785 Hyperlipidemia, unspecified: Secondary | ICD-10-CM | POA: Diagnosis not present

## 2021-07-08 DIAGNOSIS — E7849 Other hyperlipidemia: Secondary | ICD-10-CM

## 2021-07-08 DIAGNOSIS — E1159 Type 2 diabetes mellitus with other circulatory complications: Secondary | ICD-10-CM | POA: Diagnosis not present

## 2021-07-08 DIAGNOSIS — E1169 Type 2 diabetes mellitus with other specified complication: Secondary | ICD-10-CM

## 2021-07-09 ENCOUNTER — Ambulatory Visit (INDEPENDENT_AMBULATORY_CARE_PROVIDER_SITE_OTHER): Payer: Medicare Other | Admitting: Pharmacist

## 2021-07-09 DIAGNOSIS — G720 Drug-induced myopathy: Secondary | ICD-10-CM

## 2021-07-09 DIAGNOSIS — E785 Hyperlipidemia, unspecified: Secondary | ICD-10-CM

## 2021-07-09 DIAGNOSIS — I152 Hypertension secondary to endocrine disorders: Secondary | ICD-10-CM

## 2021-07-09 DIAGNOSIS — T466X5A Adverse effect of antihyperlipidemic and antiarteriosclerotic drugs, initial encounter: Secondary | ICD-10-CM

## 2021-07-09 DIAGNOSIS — E1169 Type 2 diabetes mellitus with other specified complication: Secondary | ICD-10-CM

## 2021-07-09 DIAGNOSIS — I639 Cerebral infarction, unspecified: Secondary | ICD-10-CM

## 2021-07-09 DIAGNOSIS — E1159 Type 2 diabetes mellitus with other circulatory complications: Secondary | ICD-10-CM

## 2021-07-09 NOTE — Chronic Care Management (AMB) (Signed)
Chronic Care Management Pharmacy Note  07/09/2021 Name:  Elizabeth [name] MRN:  [id number] DOB:  1940/09/17  Summary:  Received call from patient today to complete renewal application for Jardiance through [facility] for continuation of program enrollment through end of 2023. Patient and prescription sections were completed and I facilitated attending provider signature. Fax submitted today at 12:15 PM.   Subjective: Elizabeth [name] is an 80 y.o. year old female who is a primary patient of Noreene Larsson, NP.  The CCM team was consulted for assistance with disease management and care coordination needs.    Engaged with patient by telephone for follow up visit in response to provider referral for pharmacy case management and/or care coordination services.   Consent to Services:  The patient was given information about Chronic Care Management services, agreed to services, and gave verbal consent prior to initiation of services.  Please see initial visit note for detailed documentation.   Patient Care Team: Noreene Larsson, NP as PCP - General (Nurse Practitioner) Particia Nearing, OD (Optometry) Rogene Houston, MD as Consulting Physician (Gastroenterology) Luanne Bras, MD as Consulting Physician (Interventional Radiology) Beryle Lathe, [facility] as Pharmacist  Objective:  Lab Results  Component Value Date   CREATININE 0.80 06/10/2021   CREATININE 0.57 02/14/2021   CREATININE 0.81 11/05/2020    Lab Results  Component Value Date   HGBA1C 6.2 (H) 10/03/2020   Last diabetic Eye exam:  Lab Results  Component Value Date/Time   HMDIABEYEEXA No Retinopathy 05/04/2014 12:00 AM    Last diabetic Foot exam: No results found for: HMDIABFOOTEX      Component Value Date/Time   CHOL 218 (H) 10/03/2020 0838   CHOL 242 (H) 08/09/2013 0828   TRIG 105 10/03/2020 0838   TRIG 180 (H) 09/06/2015 0947   TRIG 92 08/09/2013 0828   HDL 45 10/03/2020 0838   HDL 43 09/06/2015 0947   HDL 53  08/09/2013 0828   CHOLHDL 6.5 (H) 03/30/2019 0943   CHOLHDL 7.0 01/21/2017 1015   VLDL 27 01/21/2017 1015   LDLCALC 154 (H) 10/03/2020 0838   LDLCALC 230 (H) 02/24/2014 0837   LDLCALC 171 (H) 08/09/2013 0828    Hepatic Function Latest Ref Rng & Units 02/14/2021 11/05/2020 10/03/2020  Total Protein 6.5 - 8.1 g/dL 7.3 7.2 7.1  Albumin 3.5 - 5.0 g/dL 4.1 4.4 4.4  AST 15 - 41 U/L '27 27 28  ' ALT 0 - 44 U/L '28 23 22  ' Alk Phosphatase 38 - 126 U/L 98 146(H) 138(H)  Total Bilirubin 0.3 - 1.2 mg/dL 1.3(H) 0.4 0.5  Bilirubin, Direct 0.0 - 0.2 mg/dL - - -    Lab Results  Component Value Date/Time   TSH 1.790 10/03/2020 08:38 AM   TSH 1.850 03/30/2019 09:43 AM   FREET4 1.24 10/03/2020 08:38 AM   FREET4 1.2 10/26/2008 10:16 AM    CBC Latest Ref Rng & Units 02/14/2021 11/05/2020 10/03/2020  WBC 4.0 - 10.5 K/uL 5.5 5.3 6.0  Hemoglobin 12.0 - 15.0 g/dL 15.8(H) 13.0 10.3(L)  Hematocrit 36.0 - 46.0 % 47.3(H) 41.3 33.8(L)  Platelets 150 - 400 K/uL 199 252 331    Lab Results  Component Value Date/Time   VD25OH 47.5 11/10/2018 11:48 AM   VD25OH 30.6 06/29/2018 12:06 PM    Clinical ASCVD: Yes  The ASCVD Risk score (Arnett DK, et al., 2019) failed to calculate for the following reasons:   The patient has a prior MI or stroke diagnosis  Social History   Tobacco Use  Smoking Status Former   Packs/day: 1.00   Years: 20.00   Pack years: 20.00   Types: Cigarettes   Start date: 01/15/1983   Quit date: 11/09/2020   Years since quitting: 0.6  Smokeless Tobacco Never   BP Readings from Last 3 Encounters:  06/25/21 (!) 153/77  04/16/21 (!) 151/75  02/20/21 (!) 152/67   Pulse Readings from Last 3 Encounters:  06/25/21 88  04/16/21 84  02/20/21 89   Wt Readings from Last 3 Encounters:  06/25/21 149 lb 14.4 oz (68 kg)  02/20/21 151 lb (68.5 kg)  02/05/21 151 lb (68.5 kg)    Assessment: Review of patient past medical history, allergies, medications, health status, including review of  consultants reports, laboratory and other test data, was performed as part of comprehensive evaluation and provision of chronic care management services.   SDOH:  (Social Determinants of Health) assessments and interventions performed:    CCM Care Plan  Allergies  Allergen Reactions   Banana Anaphylaxis   Codeine Nausea And Vomiting and Other (See Comments)    PROJECTILE VOMITING   Stevia [Stevioside] Swelling and Other (See Comments)    Face, tongue, and eye swelling    Actos [Pioglitazone] Swelling   Benicar [Olmesartan Medoxomil] Other (See Comments)    Feel bad   Chantix [Varenicline] Other (See Comments)    "Worked opposite."   Diovan [Valsartan] Other (See Comments)    Feel bad   Lipitor [Atorvastatin] Other (See Comments)    REACTION:  Joint pain   Spinach Other (See Comments)    Stomach problems   Vicodin [Hydrocodone-Acetaminophen] Nausea And Vomiting   Zocor [Simvastatin] Other (See Comments)    REACTION:  "made liver function incorrectly"   Ace Inhibitors Cough   Celebrex [Celecoxib] Rash   Latex Rash and Other (See Comments)    REACTION: red rash   Metformin And Related     GI - upset    Penicillins Rash and Other (See Comments)    Has patient had a PCN reaction causing immediate rash, facial/tongue/throat swelling, SOB or lightheadedness with hypotension: No Has patient had a PCN reaction causing severe rash involving mucus membranes or skin necrosis: No Has patient had a PCN reaction that required hospitalization: No- MD office Has patient had a PCN reaction occurring within the last 10 years: No If all of the above answers are "NO", then may proceed with Cephalosporin use.    Sulfonamide Derivatives Rash    Medications Reviewed Today     Reviewed by Beryle Lathe, [facility] (Pharmacist) on 07/09/21 at 1220  Med List Status: <None>   Medication Order Taking? Sig Documenting Provider Last Dose Status Informant  albuterol (VENTOLIN HFA) 108 (90 Base)  MCG/ACT inhaler [id number] Yes INHALE 2 PUFFS EVERY SIX HOURS AS NEEDED FOR WHEEZING OR SHORTNESS OF BREATH Janora Norlander, DO Taking Active            Med Note Beryle Lathe   Tue Apr 16, 2021  8:42 AM) Only takes 1-2 times per week in the Fall/Summer  amLODipine (NORVASC) 10 MG tablet [id number] Yes TAKE 1 TABLET BY MOUTH EVERY DAY Noreene Larsson, NP Taking Active   aspirin 325 MG tablet [id number] Yes Take 325 mg by mouth daily. [provider] Taking Active   Carboxymethylcellulose Sodium 1 % GEL [id number] Yes Place 1 drop into both eyes at bedtime. [provider] Taking Active Self  chlorpheniramine (CHLOR-TRIMETON) 4 MG tablet  [id number] Yes Take 2-4 mg by mouth at bedtime as needed for allergies. [provider] Taking Active Self  CINNAMON PO [id number] Yes Take 1,000 mg by mouth daily as needed (for blood sugars).  [provider] Taking Active Self  clopidogrel (PLAVIX) 75 MG tablet [id number] Yes TAKE ONE TABLET BY MOUTH DAILY (PLEASE USE AUROBINDO BRAND, PT HD FOR ALLERGY TO REDDY). Hold for 5 days Barton Dubois, MD Taking Active   JARDIANCE 10 MG TABS tablet [id number] Yes TAKE 1 TABLET BY MOUTH ONCE DAILY (TO replace farxiga) [provider] Taking Active            Med Note Jim Like Apr 25, 2021  4:17 PM) Frances Maywood from [place] [id number] Yes Grape seed oil capsule one daily [provider] Taking Active   Alma [id number] Yes Herb for blood sugar ( unsure of name) [provider] Taking Active   pantoprazole (PROTONIX) 40 MG tablet [id number] Yes TAKE 1 TABLET BY MOUTH DAILY BEFORE BREAKFAST Rogene Houston, MD Taking Active     Discontinued 11/14/11 1453 (Error)   sodium chloride (OCEAN) 0.65 % SOLN nasal spray [id number] Yes Place 1 spray into both nostrils as needed for congestion. [provider] Taking  Active Self  ursodiol (ACTIGALL) 300 MG capsule [id number] Yes Take 1 capsule (300 mg total) by mouth 3 (three) times daily. 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   History of iron deficiency anemia 12/22/2019   IDA (iron deficiency anemia) 07/04/2019   Hepatic fibrosis 07/04/2019   Abnormal transaminases 07/04/2019   Hyperlipidemia associated with type 2 diabetes mellitus ([place]) 03/25/2019   Rectal bleeding    Iron deficiency anemia 08/11/2018   Esophageal dysphagia 07/28/2018   Stroke ([place])    Elevated liver function tests 10/22/2017   Anxiety and depression 10/21/2017   Abdominal aortic atherosclerosis ([place]) 10/21/2017   Type 2 diabetes mellitus with hyperlipidemia ([place]) 09/06/2015   Type 2 diabetes mellitus with peripheral neuropathy ([place]) 11/27/2014   CVA (cerebral vascular accident) ([place]) 02/27/2012   Cerebral aneurysm 02/27/2012   Tobacco abuse 02/27/2012   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 02/27/2012   Gait instability 02/27/2012   Vision disturbance following cerebrovascular accident 02/27/2012   Dyspnea 11/14/2011   Thyroid disease 10/12/2008   Diabetes ([place]) 08/29/2008   Hyperlipidemia 08/29/2008   Obesity, unspecified 08/29/2008   COPD (chronic obstructive pulmonary disease) ([place]) 08/29/2008   WEIGHT LOSS 08/29/2008   DIARRHEA 08/29/2008   Hypertension associated with diabetes ([place]) 08/28/2008   ASTHMA 08/28/2008   ESOPHAGEAL STRICTURE 08/28/2008   GERD (gastroesophageal reflux disease) 08/28/2008   HIATAL HERNIA 08/28/2008   Diverticulosis of colon 08/28/2008   COLONIC POLYPS, HX OF 08/28/2008    Immunization History  Administered Date(s) Administered   Influenza Whole 06/08/2010   Influenza, High Dose Seasonal PF 06/17/2019   Influenza,inj,Quad PF,6+ Mos  07/07/2013, 07/06/2014, 07/17/2017   Influenza-Unspecified 08/05/2016   Pneumococcal Conjugate-13 11/25/2013   Pneumococcal Polysaccharide-23 09/09/2007    Conditions to be addressed/monitored: HTN, HLD, and DMII  Care Plan : Medication Management  Updates made by Beryle Lathe, [place] since 07/09/2021 12:00 AM     Problem: Diabetes, Hyperlipidemia/History  of Stroke, Hypertension   Priority: High  Onset Date: 04/11/2021     Long-Range Goal: Disease Progression Prevention   Start Date: 04/11/2021  Expected End Date: 07/10/2021  Recent Progress: On track  Priority: High  Note:   Current Barriers:  Unable to achieve control of hyperlipidemia  Suboptimal therapeutic regimen for hyperlipidemia  Pharmacist Clinical Goal(s):  Over the next 90 days, patient will achieve control of hyperlipidemia as evidenced by lower LDL 4-12 weeks after initiation new LDL lowering therapy adhere to plan to optimize therapeutic regimen for hyperlipidemia as evidenced by report of adherence to recommended medication management changes contact provider office for questions/concerns as evidenced notation of same in electronic health record through collaboration with PharmD and provider.   Interventions: 1:1 collaboration with Noreene Larsson, NP regarding development and update of comprehensive plan of care as evidenced by provider attestation and co-signature Inter-disciplinary care team collaboration (see longitudinal plan of care) Comprehensive medication review performed; medication list updated in electronic medical record  Diabetes: Current medications: empagliflozin (Jardiance) 10 mg by mouth once daily (receives from [employer]) Intolerances:  pioglitazone (swelling) , metformin (GI upset) Taking medications as directed: yes Side effects thought to be attributed to current medication regimen: no Denies hypoglycemic/hyperglycemic symptoms Most recent home BG values of 90-100s fasting without  hypoglycemia; blood glucose 110 2 hours after breakfast per patient report on 06/18/21 Controlled; Most recent A1c at goal of <7% per ADA guidelines Continue empagliflozin 10 mg by mouth once daily Check A1c, fasting lipid panel, and BMP at next PCP visit Today, patient assistance application for Jardiance through [place] was completed for continuation of therapy for 2023. Fax submitted today at 12:15 PM.   History of Stroke/Hyperlipidemia: Current medications: clopidogrel 75 mg by mouth daily, aspirin 325 mg by mouth daily  Patient reports that she was previously taking aspirin 81 mg daily but a doctor told her to increase it to 325 mg daily  Patient reports that she quit smoking in March 2022 Intolerances:  multiple statins (joint pain and liver function tests elevation 2X ULN) and ezetimibe Taking medications as directed: yes Side effects thought to be attributed to current medication regimen: no Uncontrolled; LDL above goal of <55 due to extreme risk given established clinical ASCVD + diabetes per 2020 AACE/ACE guidelines and TG at goal of <150 per 2020 AACE/ACE guidelines Patient previously agreeable to move forward with PCSK9 inhibitor therapy. Patient assistance application for Praluent 75 mg subcutaneously every 14 days was submitted on 06/18/21; however, it is currently on hold. Patient has been instructed to bring in proof of income for her and her spouse as well as proof of at least $500 expense at the pharmacy this calendar year. After further discussion today, patient continues to have concerns regarding potential adverse events from PCSK9i therapy and wants to delay therapy at this time to think about it further. Patient continues to disregard the medical advise provided. Will follow-up again in a few weeks. Statin intolerance noted. No statin due to serious side effects (ex. Myalgias with at least 2 different statins)  Unsure of the benefit of aspirin 325 mg daily vs the risk of  bleeding. Will ask PCP to further assess aspirin 325 mg vs 81 mg at next PCP visit and consider referral to Neurology if unable to determine by primary care.   Hypertension: Current medications: amlodipine 10 mg by mouth once daily Intolerances:  ACEi (cough), ARBs (feels bad) Taking medications as directed: yes Side effects thought to  be attributed to current medication regimen: no Denies dizziness, lightheadedness, blurred vision, and headache Most recent home blood pressure readings per patient report: 130s/70s Blood pressure control is unclear. Blood pressure is high at office visits, but patient reports normal blood pressures when monitors outside of office. Blood pressure goal is <130/80 mmHg per 2017 AHA/ACC guidelines. Continue amlodipine 10 mg by mouth once daily If additional blood pressure lowering is needed, consider adding one of the following: thiazide (chlorthalidone 12.5 mg by mouth daily), beta blocker (carvedilol 3.125 mg by mouth daily), or hydralazine 25 mg by mouth three time daily.   Patient Goals/Self-Care Activities Over the next 90 days, patient will:  Check glucose at least once daily, document, and provide at future appointments Check blood pressure at least once daily, document, and provide at future appointments Collaborate with provider on medication access solutions.  Follow Up Plan: telephone visit with care management team 08/15/21      Medication Assistance: None required.  Patient affirms current coverage meets needs.  Patient's preferred pharmacy is:  [facility], [place] [address] [place] [address] Phone: [phone] Fax: [phone]  Follow Up:  Patient agrees to Care Plan and Follow-up.  Plan: Telephone follow up appointment with care management team member scheduled for:  08/15/21  Kennon Holter, PharmD Clinical Pharmacist [facility] [phone]

## 2021-07-09 NOTE — Patient Instructions (Addendum)
Elizabeth [name],  It was great to talk to you today!  Please call me with any questions or concerns.   Visit Information  The patient verbalized understanding of instructions, educational materials, and care plan provided today and declined offer to receive copy of patient instructions, educational materials, and care plan.   Telephone follow up appointment with care management team member scheduled for: 08/15/21  Kennon Holter, PharmD Clinical Pharmacist [facility] Primary Care [phone]

## 2021-07-23 ENCOUNTER — Other Ambulatory Visit (INDEPENDENT_AMBULATORY_CARE_PROVIDER_SITE_OTHER): Payer: Self-pay | Admitting: Internal Medicine

## 2021-07-25 ENCOUNTER — Ambulatory Visit: Payer: Self-pay | Admitting: Nurse Practitioner

## 2021-07-29 ENCOUNTER — Encounter (HOSPITAL_COMMUNITY): Payer: Self-pay | Admitting: Hematology

## 2021-08-07 DIAGNOSIS — E1159 Type 2 diabetes mellitus with other circulatory complications: Secondary | ICD-10-CM | POA: Diagnosis not present

## 2021-08-07 DIAGNOSIS — I152 Hypertension secondary to endocrine disorders: Secondary | ICD-10-CM

## 2021-08-07 DIAGNOSIS — E785 Hyperlipidemia, unspecified: Secondary | ICD-10-CM

## 2021-08-07 DIAGNOSIS — I639 Cerebral infarction, unspecified: Secondary | ICD-10-CM

## 2021-08-07 DIAGNOSIS — E1169 Type 2 diabetes mellitus with other specified complication: Secondary | ICD-10-CM | POA: Diagnosis not present

## 2021-08-08 ENCOUNTER — Ambulatory Visit: Payer: Self-pay | Admitting: Nurse Practitioner

## 2021-08-09 ENCOUNTER — Ambulatory Visit (INDEPENDENT_AMBULATORY_CARE_PROVIDER_SITE_OTHER): Payer: Medicare Other | Admitting: Nurse Practitioner

## 2021-08-09 ENCOUNTER — Other Ambulatory Visit: Payer: Self-pay

## 2021-08-09 ENCOUNTER — Other Ambulatory Visit: Payer: Self-pay | Admitting: *Deleted

## 2021-08-09 ENCOUNTER — Encounter: Payer: Self-pay | Admitting: Nurse Practitioner

## 2021-08-09 ENCOUNTER — Ambulatory Visit: Payer: Medicare Other

## 2021-08-09 DIAGNOSIS — R509 Fever, unspecified: Secondary | ICD-10-CM | POA: Diagnosis not present

## 2021-08-09 DIAGNOSIS — I152 Hypertension secondary to endocrine disorders: Secondary | ICD-10-CM

## 2021-08-09 DIAGNOSIS — E7849 Other hyperlipidemia: Secondary | ICD-10-CM

## 2021-08-09 DIAGNOSIS — Z20828 Contact with and (suspected) exposure to other viral communicable diseases: Secondary | ICD-10-CM | POA: Insufficient documentation

## 2021-08-09 DIAGNOSIS — E1142 Type 2 diabetes mellitus with diabetic polyneuropathy: Secondary | ICD-10-CM

## 2021-08-09 DIAGNOSIS — D509 Iron deficiency anemia, unspecified: Secondary | ICD-10-CM

## 2021-08-09 DIAGNOSIS — E079 Disorder of thyroid, unspecified: Secondary | ICD-10-CM

## 2021-08-09 LAB — POCT INFLUENZA A/B
Influenza A, POC: NEGATIVE
Influenza B, POC: NEGATIVE

## 2021-08-09 NOTE — Assessment & Plan Note (Signed)
Flu negative.  Use ibuprofen as needed for fever. OTC cough med as needed for cough

## 2021-08-09 NOTE — Assessment & Plan Note (Signed)
Flu negative test today. Take ibuprofen OTC as needed for fever.

## 2021-08-09 NOTE — Progress Notes (Addendum)
Virtual Visit via Telephone Note  I connected with Elizabeth [name] on 08/09/21 at  8:00 AM EST by telephone and verified that I am speaking with the correct person using two identifiers. I spent 8 minutes talking to pt and reviewing her chart.   Location: Patient: home Provider: office   I discussed the limitations, risks, security and privacy concerns of performing an evaluation and management service by telephone and the availability of in person appointments. I also discussed with the patient that there may be a patient responsible charge related to this service. The patient expressed understanding and agreed to proceed.   History of Present Illness: Pt c/o of feeling bad all day yesterday, she woke up this morning with body aches, temperature of 100. She has a little cough, no SOB, wheezing, chills, CP, bloody sputum. PT's grandchildren tested positive for the flu on Saturday. Has not taken any OTC med.  She had 3 COVID vaccines , she did not take flu shot.    Observations/Objective:   Assessment and Plan: Flu test was negative Use ibuprofen as needed for fever, Take OTC cough medicine if cough gets worse. Pt encouraged to get her flu shot  Follow Up Instructions:    I discussed the assessment and treatment plan with the patient. The patient was provided an opportunity to ask questions and all were answered. The patient agreed with the plan and demonstrated an understanding of the instructions.   The patient was advised to call back or seek an in-person evaluation if the symptoms worsen or if the condition fails to improve as anticipated.

## 2021-08-14 ENCOUNTER — Telehealth: Payer: Self-pay | Admitting: Nurse Practitioner

## 2021-08-14 NOTE — Telephone Encounter (Signed)
Pt called in regard to samples  Pt states that she is almost done with the samples that she had received from manufacture   Pt would like to have more sample sent in until she receives the new ones in  January   JARDIANCE 10 MG TABS tablet

## 2021-08-15 ENCOUNTER — Ambulatory Visit: Payer: Medicare Other | Admitting: Pharmacist

## 2021-08-15 DIAGNOSIS — E7849 Other hyperlipidemia: Secondary | ICD-10-CM

## 2021-08-15 DIAGNOSIS — I639 Cerebral infarction, unspecified: Secondary | ICD-10-CM

## 2021-08-15 DIAGNOSIS — E1142 Type 2 diabetes mellitus with diabetic polyneuropathy: Secondary | ICD-10-CM

## 2021-08-15 DIAGNOSIS — G720 Drug-induced myopathy: Secondary | ICD-10-CM

## 2021-08-15 DIAGNOSIS — E1159 Type 2 diabetes mellitus with other circulatory complications: Secondary | ICD-10-CM

## 2021-08-15 MED ORDER — EMPAGLIFLOZIN 10 MG PO TABS: 10.0000 mg | ORAL_TABLET | Freq: Every day | ORAL | 0 refills | Status: DC

## 2021-08-15 NOTE — Chronic Care Management (AMB) (Signed)
Chronic Care Management Pharmacy Note  08/15/2021 Name:  Elizabeth [name] MRN:  [id number] DOB:  [birth year]  Summary:  Diabetes: Patient reports she took her last tablet of Jardiance this morning. She has been approved for Jardiance through [employer] patient assistance program through 09/07/22. She has not called them for a refill yet. Patient instructed to call [facility] and request refill to be shipped to her house. In the meantime, will leave Jardiance 10 mg (14 tablets - 2 week supply) sample for patient at front desk so she does not experience an interruption in therapy.  History of Stroke/Hyperlipidemia: Uncontrolled; LDL above goal of <70 due to very high risk given established clinical ASCVD per 2020 AACE/ACE guidelines. Triglycerides at goal of <150 per 2020 AACE/ACE guidelines. Current medications: clopidogrel 75 mg by mouth daily, aspirin 325 mg by mouth daily  Intolerances: multiple statins (joint pain and liver function tests elevation 2X ULN) and ezetimibe Patient states she wants to wait to discuss pharmacologic therapy after upcoming lipid panel. She reports she has been working on eating better.  Since she is unable to tolerate statins and ezetimibe, would consider Nexletol, but patient's insurance does not cover and no patient assistance program exists currently. PAN foundation for hypercholesterolemia is currently closed.  Previously discussed PCSK9 inhibitor therapy but patient continues to decline. Patient assistance application for Praluent could be considered; however, since patient has Medicare, in order to be approved, she would need to submit proof of at least $500 expense at the pharmacy in a calendar year which has been challenging for her.   Subjective: Elizabeth [name] is an 80 y.o. year old female who is a primary patient of Renee Rival, FNP.  The CCM team was consulted for assistance with disease management and care coordination needs.    Engaged with patient by  telephone for follow up visit in response to provider referral for pharmacy case management and/or care coordination services.   Consent to Services:  The patient was given information about Chronic Care Management services, agreed to services, and gave verbal consent prior to initiation of services.  Please see initial visit note for detailed documentation.   Patient Care Team: Renee Rival, FNP as PCP - General (Nurse Practitioner) Particia Nearing, [place] (Optometry) Rogene Houston, MD as Consulting Physician (Gastroenterology) Luanne Bras, MD as Consulting Physician (Interventional Radiology) Beryle Lathe, [facility] as Pharmacist  Objective:  Lab Results  Component Value Date   CREATININE 0.80 06/10/2021   CREATININE 0.57 02/14/2021   CREATININE 0.81 11/05/2020    Lab Results  Component Value Date   HGBA1C 6.2 (H) 10/03/2020   Last diabetic Eye exam:  Lab Results  Component Value Date/Time   HMDIABEYEEXA No Retinopathy 05/04/2014 12:00 AM    Last diabetic Foot exam: No results found for: HMDIABFOOTEX      Component Value Date/Time   CHOL 218 (H) 10/03/2020 0838   CHOL 242 (H) 08/09/2013 0828   TRIG 105 10/03/2020 0838   TRIG 180 (H) 09/06/2015 0947   TRIG 92 08/09/2013 0828   HDL 45 10/03/2020 0838   HDL 43 09/06/2015 0947   HDL 53 08/09/2013 0828   CHOLHDL 6.5 (H) 03/30/2019 0943   CHOLHDL 7.0 01/21/2017 1015   VLDL 27 01/21/2017 1015   LDLCALC 154 (H) 10/03/2020 0838   LDLCALC 230 (H) 02/24/2014 0837   LDLCALC 171 (H) 08/09/2013 0828    Hepatic Function Latest Ref Rng & Units 02/14/2021 11/05/2020 10/03/2020  Total  Protein 6.5 - 8.1 g/dL 7.3 7.2 7.1  Albumin 3.5 - 5.0 g/dL 4.1 4.4 4.4  AST 15 - 41 U/L _0 ALT 0 - 44 U/L _1 Alk Phosphatase 38 - 126 U/L 98 146(H) 138(H)  Total Bilirubin 0.3 - 1.2 mg/dL 1.3(H) 0.4 0.5  Bilirubin, Direct 0.0 - 0.2 mg/dL - - -    Lab Results  Component Value Date/Time   TSH 1.790 10/03/2020 08:38  AM   TSH 1.850 03/30/2019 09:43 AM   FREET4 1.24 10/03/2020 08:38 AM   FREET4 1.2 10/26/2008 10:16 AM    CBC Latest Ref Rng & Units 02/14/2021 11/05/2020 10/03/2020  WBC 4.0 - 10.5 K/uL 5.5 5.3 6.0  Hemoglobin 12.0 - 15.0 g/dL 15.8(H) 13.0 10.3(L)  Hematocrit 36.0 - 46.0 % 47.3(H) 41.3 33.8(L)  Platelets 150 - 400 K/uL 199 252 331    Lab Results  Component Value Date/Time   VD25OH 47.5 11/10/2018 11:48 AM   VD25OH 30.6 06/29/2018 12:06 PM    Clinical ASCVD: Yes  The ASCVD Risk score (Arnett DK, et al., 2019) failed to calculate for the following reasons:   The patient has a prior MI or stroke diagnosis    Social History   Tobacco Use  Smoking Status Former   Packs/day: 1.00   Years: 20.00   Pack years: 20.00   Types: Cigarettes   Start date: 01/15/1983   Quit date: 11/09/2020   Years since quitting: 0.7  Smokeless Tobacco Never   BP Readings from Last 3 Encounters:  06/25/21 (!) 153/77  04/16/21 (!) 151/75  02/20/21 (!) 152/67   Pulse Readings from Last 3 Encounters:  06/25/21 88  04/16/21 84  02/20/21 89   Wt Readings from Last 3 Encounters:  06/25/21 149 lb 14.4 oz (68 kg)  02/20/21 151 lb (68.5 kg)  02/05/21 151 lb (68.5 kg)    Assessment: Review of patient past medical history, allergies, medications, health status, including review of consultants reports, laboratory and other test data, was performed as part of comprehensive evaluation and provision of chronic care management services.   SDOH:  (Social Determinants of Health) assessments and interventions performed:    CCM Care Plan  Allergies  Allergen Reactions   Banana Anaphylaxis   Codeine Nausea And Vomiting and Other (See Comments)    PROJECTILE VOMITING   Stevia [Stevioside] Swelling and Other (See Comments)    Face, tongue, and eye swelling    Actos [Pioglitazone] Swelling   Benicar [Olmesartan Medoxomil] Other (See Comments)    Feel bad   Chantix [Varenicline] Other (See Comments)    "Worked  opposite."   Diovan [Valsartan] Other (See Comments)    Feel bad   Lipitor [Atorvastatin] Other (See Comments)    REACTION:  Joint pain   Spinach Other (See Comments)    Stomach problems   Vicodin [Hydrocodone-Acetaminophen] Nausea And Vomiting   Zocor [Simvastatin] Other (See Comments)    REACTION:  "made liver function incorrectly"   Ace Inhibitors Cough   Celebrex [Celecoxib] Rash   Latex Rash and Other (See Comments)    REACTION: red rash   Metformin And Related     GI - upset    Penicillins Rash and Other (See Comments)    Has patient had a PCN reaction causing immediate rash, facial/tongue/throat swelling, SOB or lightheadedness with hypotension: No Has patient had a PCN reaction causing severe rash involving mucus membranes or skin necrosis: No Has patient had a PCN reaction that  required hospitalization: No- MD office Has patient had a PCN reaction occurring within the last 10 years: No If all of the above answers are "NO", then may proceed with Cephalosporin use.    Sulfonamide Derivatives Rash    Medications Reviewed Today     Reviewed by Beryle Lathe, [facility] (Pharmacist) on 08/15/21 at [place] List Status: <None>   Medication Order Taking? Sig Documenting Provider Last Dose Status Informant  albuterol (VENTOLIN HFA) 108 (90 Base) MCG/ACT inhaler [id number] Yes INHALE 2 PUFFS EVERY SIX HOURS AS NEEDED FOR WHEEZING OR SHORTNESS OF BREATH Janora Norlander, DO Taking Active            Med Note Beryle Lathe   Tue Apr 16, 2021  8:42 AM) Only takes 1-2 times per week in the Fall/Summer  amLODipine (NORVASC) 10 MG tablet [id number] Yes TAKE 1 TABLET BY MOUTH EVERY DAY Noreene Larsson, NP Taking Active   aspirin 325 MG tablet [id number] Yes Take 325 mg by mouth daily. [provider] Taking Active   Carboxymethylcellulose Sodium 1 % GEL [id number] Yes Place 1 drop into both eyes at bedtime. [provider] Taking Active Self  chlorpheniramine  (CHLOR-TRIMETON) 4 MG tablet [id number] Yes Take 2-4 mg by mouth at bedtime as needed for allergies. [provider] Taking Active Self  CINNAMON PO [id number] Yes Take 1,000 mg by mouth daily as needed (for blood sugars).  [provider] Taking Active Self  clopidogrel (PLAVIX) 75 MG tablet [id number] Yes TAKE ONE TABLET BY MOUTH DAILY (PLEASE USE AUROBINDO BRAND, PT HD FOR ALLERGY TO REDDY). Hold for 5 days Barton Dubois, MD Taking Active   JARDIANCE 10 MG TABS tablet [id number] Yes TAKE 1 TABLET BY MOUTH ONCE DAILY (TO replace farxiga) [provider] Taking Active            Med Note Jim Like Apr 25, 2021  4:17 PM) Frances Maywood from [place] [id number] Yes Grape seed oil capsule one daily [provider] Taking Active   [place] [id number] Yes Herb for blood sugar ( unsure of name) [provider] Taking Active   pantoprazole (PROTONIX) 40 MG tablet [id number] Yes TAKE 1 TABLET BY MOUTH DAILY BEFORE BREAKFAST Rogene Houston, MD Taking Active     Discontinued 11/14/11 1453 (Error)   sodium chloride (OCEAN) 0.65 % SOLN nasal spray [id number] Yes Place 1 spray into both nostrils as needed for congestion. [provider] Taking Active Self  ursodiol (ACTIGALL) 300 MG capsule [id number] Yes Take 1 capsule (300 mg total) by mouth 3 (three) times daily. 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   History of iron deficiency anemia 12/22/2019   IDA (iron deficiency anemia) 07/04/2019   Hepatic fibrosis 07/04/2019   Abnormal transaminases 07/04/2019   Hyperlipidemia associated with type 2 diabetes mellitus ([place])  03/25/2019   Rectal bleeding    Iron deficiency anemia 08/11/2018   Esophageal dysphagia 07/28/2018   Stroke ([place])    Elevated liver function tests 10/22/2017   Anxiety and depression 10/21/2017   Abdominal aortic atherosclerosis ([place]) 10/21/2017  Type 2 diabetes mellitus with hyperlipidemia ([place]) 09/06/2015   Type 2 diabetes mellitus with peripheral neuropathy ([place]) 11/27/2014   CVA (cerebral vascular accident) ([place]) 02/27/2012   Cerebral aneurysm 02/27/2012   Tobacco abuse 02/27/2012   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 02/27/2012   Gait instability 02/27/2012   Vision disturbance following cerebrovascular accident 02/27/2012   Dyspnea 11/14/2011   Thyroid disease 10/12/2008   Diabetes ([place]) 08/29/2008   Hyperlipidemia 08/29/2008   Obesity, unspecified 08/29/2008   COPD (chronic obstructive pulmonary disease) ([place]) 08/29/2008   WEIGHT LOSS 08/29/2008   DIARRHEA 08/29/2008   Hypertension associated with diabetes ([place]) 08/28/2008   ASTHMA 08/28/2008   ESOPHAGEAL STRICTURE 08/28/2008   GERD (gastroesophageal reflux disease) 08/28/2008   HIATAL HERNIA 08/28/2008   Diverticulosis of colon 08/28/2008   COLONIC POLYPS, HX OF 08/28/2008    Immunization History  Administered Date(s) Administered   Influenza Whole 06/08/2010   Influenza, High Dose Seasonal PF 06/17/2019   Influenza,inj,Quad PF,6+ Mos 07/07/2013, 07/06/2014, 07/17/2017   Influenza-Unspecified 08/05/2016   Pneumococcal Conjugate-13 11/25/2013   Pneumococcal Polysaccharide-23 09/09/2007    Conditions to be addressed/monitored: HTN, HLD, and DMII  Care Plan : Medication Management  Updates made by Beryle Lathe, [place] since 08/15/2021 12:00 AM     Problem: Diabetes, Hyperlipidemia/History of Stroke, Hypertension   Priority: High  Onset Date: 04/11/2021     Long-Range Goal: Disease Progression Prevention   Start Date: 04/11/2021  Expected End Date: 07/10/2021  Recent Progress: On track  Priority: High  Note:    Current Barriers:  Unable to achieve control of hyperlipidemia  Suboptimal therapeutic regimen for hyperlipidemia  Pharmacist Clinical Goal(s):  Over the next 90 days, patient will achieve control of hyperlipidemia as evidenced by lower LDL 4-12 weeks after initiation new LDL lowering therapy adhere to plan to optimize therapeutic regimen for hyperlipidemia as evidenced by report of adherence to recommended medication management changes contact provider office for questions/concerns as evidenced notation of same in electronic health record through collaboration with PharmD and provider.   Interventions: 1:1 collaboration with Noreene Larsson, NP regarding development and update of comprehensive plan of care as evidenced by provider attestation and co-signature Inter-disciplinary care team collaboration (see longitudinal plan of care) Comprehensive medication review performed; medication list updated in electronic medical record  Diabetes: Controlled; Most recent A1c at goal of <7% per ADA guidelines Current medications: empagliflozin (Jardiance) 10 mg by mouth once daily (receives from [employer]) Patient reports she took her last tablet of Jardiance this morning. She has been approved for Jardiance through [employer] patient assistance program through 09/07/22. She has not called them for a refill yet. Intolerances:  pioglitazone (swelling) , metformin (GI upset) Taking medications as directed: yes Side effects thought to be attributed to current medication regimen: no Denies hypoglycemic/hyperglycemic symptoms Most recent home BG values 90-100s without hypoglycemia Continue empagliflozin 10 mg by mouth once daily Check A1c, fasting lipid panel, and BMP at next PCP visit Patient instructed to call [facility] and request refill to be shipped to her house. In the meantime, will leave Jardiance 10 mg (14 tablets - 2 week supply) sample for patient at front desk so she does not experience an  interruption in therapy.  History of Stroke/Hyperlipidemia: Uncontrolled; LDL above goal of <70 due to very high risk given established clinical ASCVD per 2020 AACE/ACE guidelines. Triglycerides at goal of <150 per 2020 AACE/ACE guidelines. Current medications: clopidogrel 75 mg by mouth daily, aspirin 325 mg by mouth daily  Patient reports that she was previously taking aspirin 81 mg daily but a doctor told her to increase it to 325 mg daily  Patient reports that she quit smoking in March 2022 Intolerances:  multiple statins (joint pain and liver function tests elevation 2X ULN) and ezetimibe Taking medications as directed: yes Side effects thought to be attributed to current medication regimen: no Patient states she wants to wait to discuss pharmacologic therapy after upcoming lipid panel. She reports she has been working on eating better.  Since she is unable to tolerate statins and ezetimibe, would consider Nexletol, but patient's insurance does not cover and no patient assistance program exists currently. PAN foundation for hypercholesterolemia is currently closed.  Previously discussed PCSK9 inhibitor therapy but patient continues to decline. Patient assistance application for Praluent could be considered; however, since patient has Medicare, in order to be approved, she would need to submit proof of at least $500 expense at the pharmacy in a calendar year which has been challenging for her.  Statin intolerance noted. No statin due to serious side effects (ex. Myalgias with at least 2 different statins)  Unsure of the benefit of aspirin 325 mg daily vs the risk of bleeding. Will ask PCP to further assess aspirin 325 mg vs 81 mg at next PCP visit and consider referral to Neurology if unable to determine by primary care.   Hypertension: Current medications: amlodipine 10 mg by mouth once daily Intolerances:  ACEi (cough), ARBs (feels bad) Taking medications as directed: yes Side effects  thought to be attributed to current medication regimen: no Denies dizziness, lightheadedness, blurred vision, and headache Most recent home blood pressure readings per patient report: 130s/70s Blood pressure control is unclear. Blood pressure is high at office visits, but patient reports normal blood pressures when monitors outside of office. Blood pressure goal is <130/80 mmHg per 2017 AHA/ACC guidelines. Continue amlodipine 10 mg by mouth once daily If additional blood pressure lowering is needed, consider adding one of the following: thiazide (chlorthalidone 12.5 mg by mouth daily), beta blocker (carvedilol 3.125 mg by mouth daily), or hydralazine 25 mg by mouth three time daily.   Patient Goals/Self-Care Activities Over the next 90 days, patient will:  Check glucose at least once daily, document, and provide at future appointments Check blood pressure at least once daily, document, and provide at future appointments Collaborate with provider on medication access solutions.  Follow Up Plan: telephone visit with care management team 09/12/21     Medication Assistance:  Jardiance through [place] through 09/07/22  Patient's preferred pharmacy is:  [facility], [place] [address] [place] [address] Phone: [phone] Fax: [phone]  Follow Up:  Patient agrees to Care Plan and Follow-up.  Plan: Telephone follow up appointment with care management team member scheduled for:  09/12/21  Kennon Holter, PharmD, [place], [place] Clinical Pharmacist Practitioner [facility] [phone]

## 2021-08-15 NOTE — Telephone Encounter (Signed)
Samples left at front desk for patient to come pick up. Patient is approved for Jardiance through [place] patient assistance program. She has been instructed to call them for a refill for ongoing therapy.   Jardiance 10 mg 14 tablets Lot: [id number] Expiration 02/06/23  Kennon Holter, PharmD, [place], CPP Clinical Pharmacist Practitioner [facility] [phone]

## 2021-08-15 NOTE — Patient Instructions (Signed)
Elizabeth [name],  It was great to talk to you today!  Please call me with any questions or concerns.   Visit Information  Following are the goals we discussed today:   Patient Goals/Self-Care Activities Over the next 90 days, patient will:  Check glucose at least once daily, document, and provide at future appointments Check blood pressure at least once daily, document, and provide at future appointments Collaborate with provider on medication access solutions.  Plan: Telephone follow up appointment with care management team member scheduled for:  09/12/21  Kennon Holter, PharmD, BCACP, CPP Clinical Pharmacist Practitioner [facility] [phone]   Please call the care guide team at [phone] if you need to cancel or reschedule your appointment.   The patient verbalized understanding of instructions, educational materials, and care plan provided today and declined offer to receive copy of patient instructions, educational materials, and care plan.

## 2021-08-19 ENCOUNTER — Ambulatory Visit: Payer: Self-pay | Admitting: Nurse Practitioner

## 2021-08-21 ENCOUNTER — Ambulatory Visit: Payer: Self-pay | Admitting: Nurse Practitioner

## 2021-08-26 DIAGNOSIS — E7849 Other hyperlipidemia: Secondary | ICD-10-CM | POA: Diagnosis not present

## 2021-08-26 DIAGNOSIS — E079 Disorder of thyroid, unspecified: Secondary | ICD-10-CM | POA: Diagnosis not present

## 2021-08-26 DIAGNOSIS — E1142 Type 2 diabetes mellitus with diabetic polyneuropathy: Secondary | ICD-10-CM | POA: Diagnosis not present

## 2021-08-26 DIAGNOSIS — E1159 Type 2 diabetes mellitus with other circulatory complications: Secondary | ICD-10-CM | POA: Diagnosis not present

## 2021-08-26 DIAGNOSIS — I152 Hypertension secondary to endocrine disorders: Secondary | ICD-10-CM | POA: Diagnosis not present

## 2021-08-27 ENCOUNTER — Telehealth: Payer: Self-pay

## 2021-08-27 LAB — CMP14+EGFR
ALT: 20 IU/L (ref 0–32)
AST: 24 IU/L (ref 0–40)
Albumin/Globulin Ratio: 1.9 (ref 1.2–2.2)
Albumin: 4.3 g/dL (ref 3.7–4.7)
Alkaline Phosphatase: 107 IU/L (ref 44–121)
BUN/Creatinine Ratio: 14 (ref 12–28)
BUN: 11 mg/dL (ref 8–27)
Bilirubin Total: 0.6 mg/dL (ref 0.0–1.2)
CO2: 21 mmol/L (ref 20–29)
Calcium: 9.9 mg/dL (ref 8.7–10.3)
Chloride: 99 mmol/L (ref 96–106)
Creatinine, Ser: 0.78 mg/dL (ref 0.57–1.00)
Globulin, Total: 2.3 g/dL (ref 1.5–4.5)
Glucose: 179 mg/dL — ABNORMAL HIGH (ref 70–99)
Potassium: 3.6 mmol/L (ref 3.5–5.2)
Sodium: 135 mmol/L (ref 134–144)
Total Protein: 6.6 g/dL (ref 6.0–8.5)
eGFR: 78 mL/min/{1.73_m2} (ref >59)

## 2021-08-27 LAB — CBC WITH DIFFERENTIAL/PLATELET
Basophils Absolute: 0 10*3/uL (ref 0.0–0.2)
Basos: 1 %
EOS (ABSOLUTE): 0.1 10*3/uL (ref 0.0–0.4)
Eos: 3 %
Hematocrit: 44.1 % (ref 34.0–46.6)
Hemoglobin: 14.5 g/dL (ref 11.1–15.9)
Immature Grans (Abs): 0 10*3/uL (ref 0.0–0.1)
Immature Granulocytes: 0 %
Lymphocytes Absolute: 1.3 10*3/uL (ref 0.7–3.1)
Lymphs: 32 %
MCH: 27.6 pg (ref 26.6–33.0)
MCHC: 32.9 g/dL (ref 31.5–35.7)
MCV: 84 fL (ref 79–97)
Monocytes Absolute: 0.2 10*3/uL (ref 0.1–0.9)
Monocytes: 6 %
Neutrophils Absolute: 2.3 10*3/uL (ref 1.4–7.0)
Neutrophils: 58 %
Platelets: 228 10*3/uL (ref 150–450)
RBC: 5.25 x10E6/uL (ref 3.77–5.28)
RDW: 11.9 % (ref 11.7–15.4)
WBC: 3.9 10*3/uL (ref 3.4–10.8)

## 2021-08-27 LAB — LIPID PANEL
Chol/HDL Ratio: 4 ratio (ref 0.0–4.4)
Cholesterol, Total: 232 mg/dL — ABNORMAL HIGH (ref 100–199)
HDL: 58 mg/dL (ref >39)
LDL Chol Calc (NIH): 156 mg/dL — ABNORMAL HIGH (ref 0–99)
Triglycerides: 101 mg/dL (ref 0–149)
VLDL Cholesterol Cal: 18 mg/dL (ref 5–40)

## 2021-08-27 LAB — HEMOGLOBIN A1C
Est. average glucose Bld gHb Est-mCnc: 120 mg/dL
Hgb A1c MFr Bld: 5.8 % — ABNORMAL HIGH (ref 4.8–5.6)

## 2021-08-27 LAB — TSH: TSH: 1.57 u[IU]/mL (ref 0.450–4.500)

## 2021-08-27 NOTE — Telephone Encounter (Signed)
I explained we are still waiting for [facility] to list [place] as approved.  I told her we are so Sorry this is not completed but assured her all the paperwork on our side is done.  She said she would call Elsie.  Talked with pt for about 25 min.

## 2021-08-28 ENCOUNTER — Ambulatory Visit (INDEPENDENT_AMBULATORY_CARE_PROVIDER_SITE_OTHER): Payer: Medicare Other | Admitting: Nurse Practitioner

## 2021-08-28 ENCOUNTER — Ambulatory Visit: Payer: Self-pay | Admitting: Nurse Practitioner

## 2021-08-28 ENCOUNTER — Encounter: Payer: Self-pay | Admitting: Nurse Practitioner

## 2021-08-28 ENCOUNTER — Other Ambulatory Visit: Payer: Self-pay

## 2021-08-28 VITALS — BP 177/92 | HR 104 | Ht 62.0 in | Wt 150.1 lb

## 2021-08-28 DIAGNOSIS — E079 Disorder of thyroid, unspecified: Secondary | ICD-10-CM | POA: Diagnosis not present

## 2021-08-28 DIAGNOSIS — E785 Hyperlipidemia, unspecified: Secondary | ICD-10-CM

## 2021-08-28 DIAGNOSIS — I152 Hypertension secondary to endocrine disorders: Secondary | ICD-10-CM | POA: Diagnosis not present

## 2021-08-28 DIAGNOSIS — E1159 Type 2 diabetes mellitus with other circulatory complications: Secondary | ICD-10-CM | POA: Diagnosis not present

## 2021-08-28 DIAGNOSIS — E1169 Type 2 diabetes mellitus with other specified complication: Secondary | ICD-10-CM

## 2021-08-28 NOTE — Assessment & Plan Note (Signed)
Lab Results  Component Value Date   HGBA1C 5.8 (H) 08/26/2021   -well controlled -no change to DM meds -pt unable to tolerate ACEi and ARBs -Statins contraindicated d/t intolerance/side effects

## 2021-08-28 NOTE — Progress Notes (Signed)
Acute Office Visit  Subjective:    Patient ID: Elizabeth [name], female    DOB: [birth year], 80 y.o.   MRN: [id number]  Chief Complaint  Patient presents with   Follow-up    Follow up    HPI Patient is in today for follow-up for T2DM, iron deficiency anemia and HLD.  She saw hematology previous for IDA, and had iron infusions.  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    Depression    Diarrhea    Diverticulosis of colon (without mention of hemorrhage)    Esophageal reflux    Family history of malignant neoplasm of gastrointestinal tract    GI bleed    Headache(784.0)    Irregular   Neuromuscular disorder (HCC)    numbness is left hand and left elbow   Obesity, unspecified    Other and unspecified hyperlipidemia    Personal history of colonic polyps    Stricture and stenosis of esophagus    Stroke ([place])    Thyroid disease    Type II or unspecified type diabetes mellitus without mention of complication, not stated as uncontrolled    Unspecified asthma(493.90)    Unspecified essential hypertension     Past Surgical History:  Procedure Laterality Date   APPENDECTOMY     BACK SURGERY     Spinal surgery   BRAIN SURGERY     2013 - stoke ( fall in 2010)    CARDIAC CATHETERIZATION  2006   LAD: 30%, RCA : 20%, normal EF, elevated LVEDP   CARDIAC CATHETERIZATION     CEREBRAL ANGIOGRAM  12/20/2015   COLONOSCOPY N/A 08/09/2018   Procedure: COLONOSCOPY;  Surgeon: Rogene Houston, MD;  [facility];  Service: Endoscopy;  Laterality: N/A;  1:55   ESOPHAGEAL DILATION N/A 08/09/2018   Procedure: ESOPHAGEAL DILATION;  Surgeon: Rogene Houston, MD;  [facility];  Service: Endoscopy;  Laterality: N/A;   ESOPHAGOGASTRODUODENOSCOPY N/A  08/09/2018   Procedure: ESOPHAGOGASTRODUODENOSCOPY (EGD);  Surgeon: Rogene Houston, MD;  [facility];  Service: Endoscopy;  Laterality: N/A;   GIVENS CAPSULE STUDY N/A 08/25/2018   Procedure: GIVENS CAPSULE STUDY;  Surgeon: Rogene Houston, MD;  [facility];  Service: Endoscopy;  Laterality: N/A;   IR ANGIO INTRA EXTRACRAN SEL COM CAROTID INNOMINATE BILAT MOD SED  01/14/2018   IR ANGIO INTRA EXTRACRAN SEL COM CAROTID INNOMINATE BILAT MOD SED  05/11/2018   IR ANGIO VERTEBRAL SEL SUBCLAVIAN INNOMINATE UNI L MOD SED  01/14/2018   IR ANGIO VERTEBRAL SEL SUBCLAVIAN INNOMINATE UNI L MOD SED  05/11/2018   IR ANGIO VERTEBRAL SEL VERTEBRAL UNI R MOD SED  01/14/2018   IR ANGIO VERTEBRAL SEL VERTEBRAL UNI R MOD SED  05/11/2018   KNEE ARTHROSCOPY     left   LUMBAR DISC SURGERY     POLYPECTOMY  08/09/2018   Procedure: POLYPECTOMY;  Surgeon: Rogene Houston, MD;  [facility];  Service: Endoscopy;;  ascending colon (CS x 2, HS x5) hepatic flexure (HSx1), recto-sigmoid (HSx1)   TONSILLECTOMY     TOTAL ABDOMINAL HYSTERECTOMY     menopause/bleeding    Family History  Problem Relation Age of Onset   Diabetes Mother    Heart disease Mother    Asthma Mother    Kidney disease Mother    Drug abuse Mother    Hypertension Mother    Stroke Mother        fall -several bleed on brain 63   Early death Father        trauma   Colon cancer Other        first cousin, paternal aunt and uncle   Cancer Paternal Grandmother        gastric   Diabetes Paternal Grandmother    Mental illness Paternal Grandmother    Heart disease Maternal Grandmother    Thyroid cancer Other        aunt   Heart disease Sister    Mental illness Sister    Heart attack Sister    Heart disease Brother    Lung disease Brother    Cancer Sister        pancreatic   Diabetes Sister    Hypertension Sister    Tuberculosis Maternal Grandfather    Heart disease Paternal Grandfather     Social History    Socioeconomic History   Marital status: Married    Spouse name: [name]   Number of children: 0   Years of education: 16   Highest education level: Bachelor's degree (e.g., BA, AB, BS)  Occupational History   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   Tobacco Use   Smoking status: Former    Packs/day: 1.00    Years: 20.00    Pack years: 20.00    Types: Cigarettes    Start date: 01/15/1983    Quit date: 11/09/2020    Years since quitting: 0.8   Smokeless tobacco: Never  Vaping Use   Vaping Use: Never used  Substance and Sexual Activity   Alcohol use: No    Alcohol/week: 0.0 standard drinks   Drug use: No   Sexual activity: Not Currently  Other Topics Concern   Not on file  Social History Narrative   Married [name]   Limited activity due to back pain   Social Determinants of Health   Financial Resource Strain: Low Risk    Difficulty of Paying Living Expenses: Not hard at all  Food Insecurity: No Food Insecurity   Worried About Charity fundraiser in the Last Year: Never true   Arboriculturist in the Last Year: Never true  Transportation Needs: No Transportation Needs   Lack of Transportation (Medical): No   Lack of Transportation (Non-Medical): No  Physical Activity: Insufficiently Active   Days of Exercise per Week: 3 days   Minutes of Exercise per Session: 20 min  Stress: No Stress Concern Present   Feeling of Stress : Not at all  Social Connections: Moderately Isolated   Frequency of Communication with Friends and Family: More than three times a week   Frequency of Social Gatherings with Friends and Family: More than three times a week   Attends Religious Services: Never   Marine scientist or Organizations: No   Attends Music therapist: Never   Marital Status: Married  Human resources officer Violence: Not At Risk   Fear of Current or Ex-Partner: No   Emotionally Abused: No   Physically Abused: No   Sexually Abused: No     Outpatient Medications Prior to Visit  Medication Sig Dispense Refill   albuterol (  VENTOLIN HFA) 108 (90 Base) MCG/ACT inhaler INHALE 2 PUFFS EVERY SIX HOURS AS NEEDED FOR WHEEZING OR SHORTNESS OF BREATH 8.5 g 2   amLODipine (NORVASC) 10 MG tablet TAKE 1 TABLET BY MOUTH EVERY DAY 90 tablet 1   aspirin 325 MG tablet Take 325 mg by mouth daily.     Carboxymethylcellulose Sodium 1 % GEL Place 1 drop into both eyes at bedtime.     chlorpheniramine (CHLOR-TRIMETON) 4 MG tablet Take 2-4 mg by mouth at bedtime as needed for allergies.     CINNAMON PO Take 1,000 mg by mouth daily as needed (for blood sugars).      clopidogrel (PLAVIX) 75 MG tablet TAKE ONE TABLET BY MOUTH DAILY (PLEASE USE AUROBINDO BRAND, PT HD FOR ALLERGY TO REDDY). Hold for 5 days 90 tablet 1   empagliflozin (JARDIANCE) 10 MG TABS tablet Take 1 tablet (10 mg total) by mouth daily before breakfast. 14 tablet 0   OVER THE COUNTER MEDICATION Grape seed oil capsule one daily     OVER THE COUNTER MEDICATION Herb for blood sugar ( unsure of name)     pantoprazole (PROTONIX) 40 MG tablet TAKE 1 TABLET BY MOUTH DAILY BEFORE BREAKFAST 30 tablet 5   sodium chloride (OCEAN) 0.65 % SOLN nasal spray Place 1 spray into both nostrils as needed for congestion.     ursodiol (ACTIGALL) 300 MG capsule Take 1 capsule (300 mg total) by mouth 3 (three) times daily. 270 capsule 3   No facility-administered medications prior to visit.    Allergies  Allergen Reactions   Banana Anaphylaxis   Codeine Nausea And Vomiting and Other (See Comments)    PROJECTILE VOMITING   Stevia [Stevioside] Swelling and Other (See Comments)    Face, tongue, and eye swelling    Actos [Pioglitazone] Swelling   Benicar [Olmesartan Medoxomil] Other (See Comments)    Feel bad   Chantix [Varenicline] Other (See Comments)    "Worked opposite."   Diovan [Valsartan] Other (See Comments)    Feel bad   Lipitor [Atorvastatin] Other (See Comments)    REACTION:  Joint pain    Spinach Other (See Comments)    Stomach problems   Vicodin [Hydrocodone-Acetaminophen] Nausea And Vomiting   Zocor [Simvastatin] Other (See Comments)    REACTION:  "made liver function incorrectly"   Ace Inhibitors Cough   Celebrex [Celecoxib] Rash   Latex Rash and Other (See Comments)    REACTION: red rash   Metformin And Related     GI - upset    Penicillins Rash and Other (See Comments)    Has patient had a PCN reaction causing immediate rash, facial/tongue/throat swelling, SOB or lightheadedness with hypotension: No Has patient had a PCN reaction causing severe rash involving mucus membranes or skin necrosis: No Has patient had a PCN reaction that required hospitalization: No- MD office Has patient had a PCN reaction occurring within the last 10 years: No If all of the above answers are "NO", then may proceed with Cephalosporin use.    Sulfonamide Derivatives Rash    Review of Systems  Constitutional: Negative.   Respiratory: Negative.    Cardiovascular: Negative.   Endocrine: Negative.   Psychiatric/Behavioral: Negative.        Objective:    Physical Exam Constitutional:      Appearance: Normal appearance.  Cardiovascular:     Rate and Rhythm: Normal rate and regular rhythm.     Pulses: Normal pulses.     Heart sounds: Normal heart  sounds.  Pulmonary:     Effort: Pulmonary effort is normal.     Breath sounds: Normal breath sounds.  Neurological:     Mental Status: She is alert.  Psychiatric:        Mood and Affect: Mood normal.        Behavior: Behavior normal.        Thought Content: Thought content normal.        Judgment: Judgment normal.    BP (!) 177/92    Pulse (!) 104    Ht '5\' 2"'  (1.575 m)    Wt 150 lb 1.9 oz (68.1 kg)    SpO2 95%    BMI 27.46 kg/m  Wt Readings from Last 3 Encounters:  08/28/21 150 lb 1.9 oz (68.1 kg)  06/25/21 149 lb 14.4 oz (68 kg)  02/20/21 151 lb (68.5 kg)    Health Maintenance Due  Topic Date Due   COVID-19 Vaccine (1)  Never done   Zoster Vaccines- Shingrix (1 of 2) Never done   Pneumonia Vaccine 27+ Years old (3 - PPSV23 if available, else PCV20) 11/26/2014   OPHTHALMOLOGY EXAM  05/05/2015   FOOT EXAM  11/10/2019   URINE MICROALBUMIN  10/02/2020   TETANUS/TDAP  01/06/2021    There are no preventive care reminders to display for this patient.   Lab Results  Component Value Date   TSH 1.570 08/26/2021   Lab Results  Component Value Date   WBC 3.9 08/26/2021   HGB 14.5 08/26/2021   HCT 44.1 08/26/2021   MCV 84 08/26/2021   PLT 228 08/26/2021   Lab Results  Component Value Date   NA 135 08/26/2021   K 3.6 08/26/2021   CO2 21 08/26/2021   GLUCOSE 179 (H) 08/26/2021   BUN 11 08/26/2021   CREATININE 0.78 08/26/2021   BILITOT 0.6 08/26/2021   ALKPHOS 107 08/26/2021   AST 24 08/26/2021   ALT 20 08/26/2021   PROT 6.6 08/26/2021   ALBUMIN 4.3 08/26/2021   CALCIUM 9.9 08/26/2021   ANIONGAP 6 02/14/2021   EGFR 78 08/26/2021   Lab Results  Component Value Date   CHOL 232 (H) 08/26/2021   Lab Results  Component Value Date   HDL 58 08/26/2021   Lab Results  Component Value Date   LDLCALC 156 (H) 08/26/2021   Lab Results  Component Value Date   TRIG 101 08/26/2021   Lab Results  Component Value Date   CHOLHDL 4.0 08/26/2021   Lab Results  Component Value Date   HGBA1C 5.8 (H) 08/26/2021       Assessment & Plan:   Problem List Items Addressed This Visit       Cardiovascular and Mediastinum   Hypertension associated with diabetes ([place])    BP Readings from Last 3 Encounters:  08/28/21 (!) 177/92  06/25/21 (!) 153/77  04/16/21 (!) 151/75  -has intolerance to ACEis, olmesartan, and valsartan -taking amlodipine 10 mg -she states her home BPs have been in 120-130s/70-80s and she has white coat syndrome; she isn't interested in increasing BP meds today      Relevant Orders   CBC with Differential/Platelet   CMP14+EGFR   Lipid Panel With LDL/HDL Ratio     Endocrine    Thyroid disease    Lab Results  Component Value Date   TSH 1.570 08/26/2021   T4TOTAL 10.0 03/30/2019  -no med changes      Relevant Orders   TSH   Type 2 diabetes mellitus with  hyperlipidemia ([place]) - Primary    Lab Results  Component Value Date   HGBA1C 5.8 (H) 08/26/2021  -well controlled -no change to DM meds -pt unable to tolerate ACEi and ARBs -Statins contraindicated d/t intolerance/side effects      Relevant Orders   CBC with Differential/Platelet   CMP14+EGFR   Lipid Panel With LDL/HDL Ratio   Hemoglobin A1c   Hyperlipidemia associated with type 2 diabetes mellitus ([place])    Lab Results  Component Value Date   CHOL 232 (H) 08/26/2021   HDL 58 08/26/2021   LDLCALC 156 (H) 08/26/2021   TRIG 101 08/26/2021   CHOLHDL 4.0 08/26/2021  -goal LDL < 70, but patient refused treatment -intolerant of statins and zetia -we discussed PCSK-9 inhibitors, but she is not interested despite having significant cardiovascular risk -discussed that she is at risk for MI or CVA that could resul tin death, but she states she has been told this by cardiology and pharmacy and maybe a few other doctors and she is 80 years old and has to die at some point, but she doesn't want to take cholesterol medicine      Relevant Orders   Lipid Panel With LDL/HDL Ratio     No orders of the defined types were placed in this encounter.    Noreene Larsson, NP

## 2021-08-28 NOTE — Assessment & Plan Note (Signed)
Lab Results  Component Value Date   TSH 1.570 08/26/2021   T4TOTAL 10.0 03/30/2019   -no med changes

## 2021-08-28 NOTE — Assessment & Plan Note (Signed)
Lab Results  Component Value Date   CHOL 232 (H) 08/26/2021   HDL 58 08/26/2021   LDLCALC 156 (H) 08/26/2021   TRIG 101 08/26/2021   CHOLHDL 4.0 08/26/2021   -goal LDL < 70, but patient refused treatment -intolerant of statins and zetia -we discussed PCSK-9 inhibitors, but she is not interested despite having significant cardiovascular risk -discussed that she is at risk for MI or CVA that could resul tin death, but she states she has been told this by cardiology and pharmacy and maybe a few other doctors and she is 80 years old and has to die at some point, but she doesn't want to take cholesterol medicine

## 2021-08-28 NOTE — Assessment & Plan Note (Addendum)
BP Readings from Last 3 Encounters:  08/28/21 (!) 177/92  06/25/21 (!) 153/77  04/16/21 (!) 151/75   -has intolerance to ACEis, olmesartan, and valsartan -taking amlodipine 10 mg -she states her home BPs have been in 120-130s/70-80s and she has white coat syndrome; she isn't interested in increasing BP meds today

## 2021-08-28 NOTE — Patient Instructions (Addendum)
Please have fasting labs drawn 2-3 days prior to your appointment so we can discuss the results during your office visit.  I will be moving to [facility] located at [address], [place], [place] [address] effective Sep 08, 2021. If you would like to establish care with [facility] please call [phone].

## 2021-09-04 ENCOUNTER — Telehealth: Payer: Self-pay | Admitting: Student

## 2021-09-04 MED ORDER — CLOPIDOGREL BISULFATE 75 MG PO TABS: ORAL_TABLET | ORAL | 1 refills | Status: DC

## 2021-09-04 NOTE — Telephone Encounter (Signed)
Plavix refill e-prescribed to patient's pharmacy in [place]. Take one 75 mg tablet by mouth daily. Dispense 90 with one refill.  Soyla Dryer, [place] [phone] 09/04/2021, 9:06 AM

## 2021-09-12 ENCOUNTER — Ambulatory Visit (INDEPENDENT_AMBULATORY_CARE_PROVIDER_SITE_OTHER): Payer: Medicare Other | Admitting: Pharmacist

## 2021-09-12 DIAGNOSIS — E785 Hyperlipidemia, unspecified: Secondary | ICD-10-CM

## 2021-09-12 DIAGNOSIS — E1169 Type 2 diabetes mellitus with other specified complication: Secondary | ICD-10-CM

## 2021-09-12 DIAGNOSIS — I152 Hypertension secondary to endocrine disorders: Secondary | ICD-10-CM

## 2021-09-12 DIAGNOSIS — I639 Cerebral infarction, unspecified: Secondary | ICD-10-CM

## 2021-09-12 DIAGNOSIS — E1159 Type 2 diabetes mellitus with other circulatory complications: Secondary | ICD-10-CM

## 2021-09-12 NOTE — Patient Instructions (Signed)
Elizabeth [name],  It was great to talk to you today!  Please call me with any questions or concerns.   Visit Information  Following are the goals we discussed today:  Patient Goals/Self-Care Activities Over the next 90 days, patient will:  Check glucose at least once daily, document, and provide at future appointments Check blood pressure at least once daily, document, and provide at future appointments Collaborate with provider on medication access solutions  Plan: Telephone follow up appointment with care management team member scheduled for:  12/10/21  Kennon Holter, PharmD, BCACP, CPP Clinical Pharmacist Practitioner [facility] [phone]   Please call the care guide team at [phone] if you need to cancel or reschedule your appointment.   The patient verbalized understanding of instructions, educational materials, and care plan provided today and declined offer to receive copy of patient instructions, educational materials, and care plan.

## 2021-09-12 NOTE — Chronic Care Management (AMB) (Signed)
Chronic Care Management Pharmacy Note  09/12/2021 Name:  Elizabeth [name] MRN:  [id number] DOB:  [birth year]  Summary: Diabetes: Patient has been approved for Jardiance through [place] patient assistance program through 09/07/22. She is aware to call them when due for refills.  History of Stroke/Hyperlipidemia: Uncontrolled - No improvement in lipid panel despite improvement in diet; LDL above goal of <70 due to very high risk given established clinical ASCVD per 2020 AACE/ACE guidelines. Current medications: clopidogrel 75 mg by mouth daily, aspirin 325 mg by mouth daily  Intolerances:  multiple statins (joint pain and liver function tests elevation 2X ULN) and ezetimibe Since she is unable to tolerate statins and ezetimibe, would consider Nexletol, but patient's insurance does not cover and no patient assistance program exists currently. PAN foundation for hypercholesterolemia is currently closed.  Previously discussed PCSK9 inhibitor therapy but patient continues to decline. Patient assistance application for Praluent could be considered; however, since patient has Medicare, in order to be approved, she would need to submit proof of at least $500 expense at the pharmacy in a calendar year which has been challenging for her.  Unsure of the benefit of aspirin 325 mg daily vs the risk of bleeding. Will ask PCP to further assess aspirin 325 mg vs 81 mg at next PCP visit and consider referral to Neurology if unable to determine by primary care.   Hypertension: Blood pressure control is unclear. Blood pressure is high at office visits, but patient reports normal blood pressures when monitors outside of office. Blood pressure goal is <130/80 mmHg per 2017 AHA/ACC guidelines. Most recent home blood pressure readings per patient report: 140s/70s Continue amlodipine 10 mg by mouth once daily If additional blood pressure lowering is needed, consider adding one of the following: thiazide (HCTZ or  indapamide), beta blocker (carvedilol 3.125 mg by mouth daily), or hydralazine 25 mg by mouth three time daily.   Subjective: Elizabeth [name] is an 81 y.o. year old female who is a primary patient of Renee Rival, FNP.  The CCM team was consulted for assistance with disease management and care coordination needs.    Engaged with patient by telephone for follow up visit in response to provider referral for pharmacy case management and/or care coordination services.   Consent to Services:  The patient was given information about Chronic Care Management services, agreed to services, and gave verbal consent prior to initiation of services.  Please see initial visit note for detailed documentation.   Patient Care Team: Renee Rival, FNP as PCP - General (Nurse Practitioner) Particia Nearing, [place] (Optometry) Rogene Houston, MD as Consulting Physician (Gastroenterology) Luanne Bras, MD as Consulting Physician (Interventional Radiology) Beryle Lathe, [facility] as Pharmacist  Objective:  Lab Results  Component Value Date   CREATININE 0.78 08/26/2021   CREATININE 0.80 06/10/2021   CREATININE 0.57 02/14/2021    Lab Results  Component Value Date   HGBA1C 5.8 (H) 08/26/2021   Last diabetic Eye exam:  Lab Results  Component Value Date/Time   HMDIABEYEEXA No Retinopathy 05/04/2014 12:00 AM    Last diabetic Foot exam: No results found for: HMDIABFOOTEX      Component Value Date/Time   CHOL 232 (H) 08/26/2021 0845   CHOL 242 (H) 08/09/2013 0828   TRIG 101 08/26/2021 0845   TRIG 180 (H) 09/06/2015 0947   TRIG 92 08/09/2013 0828   HDL 58 08/26/2021 0845   HDL 43 09/06/2015 0947   HDL 53 08/09/2013 0828  CHOLHDL 4.0 08/26/2021 0845   CHOLHDL 7.0 01/21/2017 1015   VLDL 27 01/21/2017 1015   LDLCALC 156 (H) 08/26/2021 0845   LDLCALC 230 (H) 02/24/2014 0837   LDLCALC 171 (H) 08/09/2013 0828    Hepatic Function Latest Ref Rng & Units 08/26/2021 02/14/2021 11/05/2020   Total Protein 6.0 - 8.5 g/dL 6.6 7.3 7.2  Albumin 3.7 - 4.7 g/dL 4.3 4.1 4.4  AST 0 - 40 IU/L '24 27 27  ' ALT 0 - 32 IU/L '20 28 23  ' Alk Phosphatase 44 - 121 IU/L 107 98 146(H)  Total Bilirubin 0.0 - 1.2 mg/dL 0.6 1.3(H) 0.4  Bilirubin, Direct 0.0 - 0.2 mg/dL - - -    Lab Results  Component Value Date/Time   TSH 1.570 08/26/2021 08:45 AM   TSH 1.790 10/03/2020 08:38 AM   FREET4 1.24 10/03/2020 08:38 AM   FREET4 1.2 10/26/2008 10:16 AM    CBC Latest Ref Rng & Units 08/26/2021 02/14/2021 11/05/2020  WBC 3.4 - 10.8 x10E3/uL 3.9 5.5 5.3  Hemoglobin 11.1 - 15.9 g/dL 14.5 15.8(H) 13.0  Hematocrit 34.0 - 46.6 % 44.1 47.3(H) 41.3  Platelets 150 - 450 x10E3/uL 228 199 252    Lab Results  Component Value Date/Time   VD25OH 47.5 11/10/2018 11:48 AM   VD25OH 30.6 06/29/2018 12:06 PM    Clinical ASCVD: Yes  The ASCVD Risk score (Arnett DK, et al., 2019) failed to calculate for the following reasons:   The patient has a prior MI or stroke diagnosis    Social History   Tobacco Use  Smoking Status Former   Packs/day: 1.00   Years: 20.00   Pack years: 20.00   Types: Cigarettes   Start date: 01/15/1983   Quit date: 11/09/2020   Years since quitting: 0.8  Smokeless Tobacco Never   BP Readings from Last 3 Encounters:  08/28/21 (!) 177/92  06/25/21 (!) 153/77  04/16/21 (!) 151/75   Pulse Readings from Last 3 Encounters:  08/28/21 (!) 104  06/25/21 88  04/16/21 84   Wt Readings from Last 3 Encounters:  08/28/21 150 lb 1.9 oz (68.1 kg)  06/25/21 149 lb 14.4 oz (68 kg)  02/20/21 151 lb (68.5 kg)    Assessment: Review of patient past medical history, allergies, medications, health status, including review of consultants reports, laboratory and other test data, was performed as part of comprehensive evaluation and provision of chronic care management services.   SDOH:  (Social Determinants of Health) assessments and interventions performed:    CCM Care Plan  Allergies  Allergen  Reactions   Banana Anaphylaxis   Codeine Nausea And Vomiting and Other (See Comments)    PROJECTILE VOMITING   Stevia [Stevioside] Swelling and Other (See Comments)    Face, tongue, and eye swelling    Actos [Pioglitazone] Swelling   Benicar [Olmesartan Medoxomil] Other (See Comments)    Feel bad   Chantix [Varenicline] Other (See Comments)    "Worked opposite."   Diovan [Valsartan] Other (See Comments)    Feel bad   Lipitor [Atorvastatin] Other (See Comments)    REACTION:  Joint pain   Spinach Other (See Comments)    Stomach problems   Vicodin [Hydrocodone-Acetaminophen] Nausea And Vomiting   Zocor [Simvastatin] Other (See Comments)    REACTION:  "made liver function incorrectly"   Ace Inhibitors Cough   Celebrex [Celecoxib] Rash   Latex Rash and Other (See Comments)    REACTION: red rash   Metformin And Related  GI - upset    Penicillins Rash and Other (See Comments)    Has patient had a PCN reaction causing immediate rash, facial/tongue/throat swelling, SOB or lightheadedness with hypotension: No Has patient had a PCN reaction causing severe rash involving mucus membranes or skin necrosis: No Has patient had a PCN reaction that required hospitalization: No- MD office Has patient had a PCN reaction occurring within the last 10 years: No If all of the above answers are "NO", then may proceed with Cephalosporin use.    Sulfonamide Derivatives Rash    Medications Reviewed Today     Reviewed by Beryle Lathe, [facility] (Pharmacist) on 09/12/21 at 1514  Med List Status: <None>   Medication Order Taking? Sig Documenting Provider Last Dose Status Informant  albuterol (VENTOLIN HFA) 108 (90 Base) MCG/ACT inhaler [id number] Yes INHALE 2 PUFFS EVERY SIX HOURS AS NEEDED FOR WHEEZING OR SHORTNESS OF BREATH Janora Norlander, DO Taking Active            Med Note Beryle Lathe   Tue Apr 16, 2021  8:42 AM) Only takes 1-2 times per week in the Fall/Summer  amLODipine  (NORVASC) 10 MG tablet [id number] Yes TAKE 1 TABLET BY MOUTH EVERY DAY Noreene Larsson, NP Taking Active   aspirin 325 MG tablet [id number] Yes Take 325 mg by mouth daily. [provider] Taking Active   Carboxymethylcellulose Sodium 1 % GEL [id number] Yes Place 1 drop into both eyes at bedtime. [provider] Taking Active Self  chlorpheniramine (CHLOR-TRIMETON) 4 MG tablet [id number] Yes Take 2-4 mg by mouth at bedtime as needed for allergies. [provider] Taking Active Self  CINNAMON PO [id number] Yes Take 1,000 mg by mouth daily as needed (for blood sugars).  [provider] Taking Active Self  clopidogrel (PLAVIX) 75 MG tablet [id number] Yes TAKE ONE TABLET BY MOUTH DAILY [place], Ardeth Perfect, NP Taking Active   empagliflozin (JARDIANCE) 10 MG TABS tablet [id number] Yes Take 1 tablet (10 mg total) by mouth daily before breakfast. Lindell Spar, MD Taking Active   OVER THE COUNTER MEDICATION [id number] Yes Grape seed oil capsule one daily [provider] Taking Active   OVER [place] [id number] Yes Herb for blood sugar ( unsure of name) [provider] Taking Active   pantoprazole (PROTONIX) 40 MG tablet [id number] Yes TAKE 1 TABLET BY MOUTH DAILY BEFORE BREAKFAST Rehman, Mechele Dawley, MD Taking Active     Discontinued 11/14/11 1453 (Error)   sodium chloride (OCEAN) 0.65 % SOLN nasal spray [id number] Yes Place 1 spray into both nostrils as needed for congestion. [provider] Taking Active Self  ursodiol (ACTIGALL) 300 MG capsule [id number] Yes Take 1 capsule (300 mg total) by mouth 3 (three) times daily. 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   History of iron deficiency anemia 12/22/2019   IDA (iron deficiency anemia) 07/04/2019   Hepatic fibrosis 07/04/2019   Hyperlipidemia associated with type 2 diabetes mellitus  ([place]) 03/25/2019   Rectal bleeding    Esophageal dysphagia 07/28/2018   Stroke ([place])    Anxiety and depression 10/21/2017   Abdominal aortic atherosclerosis ([place]) 10/21/2017   Type 2 diabetes mellitus with hyperlipidemia ([place]) 09/06/2015   Type 2 diabetes mellitus with peripheral neuropathy ([place]) 11/27/2014   CVA (cerebral vascular accident) ([place]) 02/27/2012   Cerebral  aneurysm 02/27/2012   Tobacco abuse 02/27/2012   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 02/27/2012   Gait instability 02/27/2012   Vision disturbance following cerebrovascular accident 02/27/2012   Dyspnea 11/14/2011   Thyroid disease 10/12/2008   Diabetes ([place]) 08/29/2008   Obesity, unspecified 08/29/2008   COPD (chronic obstructive pulmonary disease) ([place]) 08/29/2008   WEIGHT LOSS 08/29/2008   Hypertension associated with diabetes ([place]) 08/28/2008   ASTHMA 08/28/2008   ESOPHAGEAL STRICTURE 08/28/2008   GERD (gastroesophageal reflux disease) 08/28/2008   HIATAL HERNIA 08/28/2008   Diverticulosis of colon 08/28/2008    Immunization History  Administered Date(s) Administered   Influenza Whole 06/08/2010   Influenza, High Dose Seasonal PF 06/17/2019   Influenza,inj,Quad PF,6+ Mos 07/07/2013, 07/06/2014, 07/17/2017   Influenza-Unspecified 08/05/2016   Pneumococcal Conjugate-13 11/25/2013   Pneumococcal Polysaccharide-23 09/09/2007    Conditions to be addressed/monitored: HTN, HLD, and DMII  Care Plan : Medication Management  Updates made by Beryle Lathe, [place] since 09/12/2021 12:00 AM     Problem: Diabetes, Hyperlipidemia/History of Stroke, Hypertension   Priority: High  Onset Date: 04/11/2021     Long-Range Goal: Disease Progression Prevention   Start Date: 04/11/2021  Expected End Date: 07/10/2021  Recent Progress: On track  Priority: High  Note:   Current Barriers:  Unable to achieve control of hyperlipidemia  Suboptimal therapeutic regimen for hyperlipidemia  Pharmacist Clinical Goal(s):  Through  collaboration with PharmD and provider, patient will  achieve control of hyperlipidemia as evidenced by lower LDL 4-12 weeks after initiation new LDL lowering therapy adhere to plan to optimize therapeutic regimen for hyperlipidemia as evidenced by report of adherence to recommended medication management changes contact provider office for questions/concerns as evidenced notation of same in electronic health record   Interventions: 1:1 collaboration with Renee Rival, FNP regarding development and update of comprehensive plan of care as evidenced by provider attestation and co-signature Inter-disciplinary care team collaboration (see longitudinal plan of care) Comprehensive medication review performed; medication list updated in electronic medical record  Diabetes: Controlled; Most recent A1c at goal of <7% per ADA guidelines Current medications: empagliflozin (Jardiance) 10 mg by mouth once daily Patient has been approved for Jardiance through [place] patient assistance program through 09/07/22. She is aware to call them when due for refills. Intolerances:  pioglitazone (swelling) , metformin (GI upset) Taking medications as directed: yes Side effects thought to be attributed to current medication regimen: no Denies hypoglycemic/hyperglycemic symptoms Most recent home BG values 90-100s without hypoglycemia Continue empagliflozin (Jardiance) 10 mg by mouth once daily   History of Stroke/Hyperlipidemia: Uncontrolled - No improvement in lipid panel despite improvement in diet; LDL above goal of <70 due to very high risk given established clinical ASCVD per 2020 AACE/ACE guidelines. Triglycerides at goal of <150 per 2020 AACE/ACE guidelines. Current medications: clopidogrel 75 mg by mouth daily, aspirin 325 mg by mouth daily  Patient reports that she was previously taking aspirin 81 mg daily but a doctor told her to increase it to 325 mg daily  Patient reports that she quit smoking in  March 2022 Intolerances:  multiple statins (joint pain and liver function tests elevation 2X ULN) and ezetimibe Taking medications as directed: yes Side effects thought to be attributed to current medication regimen: no  Since she is unable to tolerate statins and ezetimibe, would consider Nexletol, but patient's insurance does not cover and no patient assistance program exists currently. PAN foundation for hypercholesterolemia is currently closed.  Previously discussed PCSK9 inhibitor therapy but patient  continues to decline. Patient assistance application for Praluent could be considered; however, since patient has Medicare, in order to be approved, she would need to submit proof of at least $500 expense at the pharmacy in a calendar year which has been challenging for her.  Statin intolerance noted. No statin due to serious side effects (ex. Myalgias with at least 2 different statins)  Unsure of the benefit of aspirin 325 mg daily vs the risk of bleeding. Will ask PCP to further assess aspirin 325 mg vs 81 mg at next PCP visit and consider referral to Neurology if unable to determine by primary care.   Hypertension: Blood pressure control is unclear. Blood pressure is high at office visits, but patient reports normal blood pressures when monitors outside of office. Blood pressure goal is <130/80 mmHg per 2017 AHA/ACC guidelines. Current medications: amlodipine 10 mg by mouth once daily Intolerances:  ACEi (cough), ARBs (feels bad) Taking medications as directed: yes Side effects thought to be attributed to current medication regimen: no Denies dizziness, lightheadedness, blurred vision, and headache Most recent home blood pressure readings per patient report: 140s/70s Continue amlodipine 10 mg by mouth once daily Encourage dietary sodium restriction/DASH diet Recommend home blood pressure monitoring to discuss at next visit Reviewed risks of hypertension, principles of treatment and  consequences of untreated hypertension for now If additional blood pressure lowering is needed, consider adding one of the following: thiazide (HCTZ or indapamide), beta blocker (carvedilol 3.125 mg by mouth daily), or hydralazine 25 mg by mouth three time daily.   Patient Goals/Self-Care Activities Over the next 90 days, patient will:  Check glucose at least once daily, document, and provide at future appointments Check blood pressure at least once daily, document, and provide at future appointments Collaborate with provider on medication access solutions  Follow Up Plan: telephone visit with care management team 12/10/21     Medication Assistance:  [place] for Jardiance through 09/07/22  Patient's preferred pharmacy is:  [facility], [place] [address] [place] [address] Phone: [phone] Fax: [phone]  Follow Up:  Patient agrees to Care Plan and Follow-up.  Plan: Telephone follow up appointment with care management team member scheduled for:  12/10/21  Kennon Holter, PharmD, [place], [place] Clinical Pharmacist Practitioner [facility] [phone]

## 2021-09-27 ENCOUNTER — Other Ambulatory Visit (HOSPITAL_COMMUNITY): Payer: Self-pay | Admitting: Nurse Practitioner

## 2021-10-01 ENCOUNTER — Other Ambulatory Visit: Payer: Self-pay | Admitting: Family Medicine

## 2021-10-02 ENCOUNTER — Telehealth: Payer: Self-pay

## 2021-10-02 NOTE — Telephone Encounter (Signed)
Patient called asked can we setup mammogram for May at [facility].  Can we set this up for before she comes in May. Last one was 2 years ago.

## 2021-10-03 ENCOUNTER — Other Ambulatory Visit: Payer: Self-pay

## 2021-10-03 DIAGNOSIS — Z780 Asymptomatic menopausal state: Secondary | ICD-10-CM

## 2021-10-03 NOTE — Telephone Encounter (Signed)
Order placed

## 2021-10-08 DIAGNOSIS — E785 Hyperlipidemia, unspecified: Secondary | ICD-10-CM | POA: Diagnosis not present

## 2021-10-08 DIAGNOSIS — I152 Hypertension secondary to endocrine disorders: Secondary | ICD-10-CM

## 2021-10-08 DIAGNOSIS — E1169 Type 2 diabetes mellitus with other specified complication: Secondary | ICD-10-CM | POA: Diagnosis not present

## 2021-10-08 DIAGNOSIS — E1159 Type 2 diabetes mellitus with other circulatory complications: Secondary | ICD-10-CM

## 2021-10-08 DIAGNOSIS — I639 Cerebral infarction, unspecified: Secondary | ICD-10-CM

## 2021-10-24 ENCOUNTER — Telehealth (INDEPENDENT_AMBULATORY_CARE_PROVIDER_SITE_OTHER): Payer: Self-pay | Admitting: *Deleted

## 2021-10-24 NOTE — Telephone Encounter (Signed)
Patient called in today, stated she was supposed to have repeat TCS in 6 mths after last exam of 08/2018, letter was mailed but patient never responded - please advise if we need to proceed with scheduling

## 2021-10-29 NOTE — Telephone Encounter (Signed)
Already addressed

## 2021-11-08 ENCOUNTER — Observation Stay
Admission: AD | Admit: 2021-11-08 | Payer: Medicare Other | Source: Other Acute Inpatient Hospital | Admitting: Family Medicine

## 2021-11-08 DIAGNOSIS — Z794 Long term (current) use of insulin: Secondary | ICD-10-CM | POA: Diagnosis not present

## 2021-11-08 DIAGNOSIS — R220 Localized swelling, mass and lump, head: Secondary | ICD-10-CM | POA: Diagnosis not present

## 2021-11-08 DIAGNOSIS — E119 Type 2 diabetes mellitus without complications: Secondary | ICD-10-CM | POA: Diagnosis not present

## 2021-11-08 DIAGNOSIS — E7800 Pure hypercholesterolemia, unspecified: Secondary | ICD-10-CM | POA: Diagnosis not present

## 2021-11-08 DIAGNOSIS — M7989 Other specified soft tissue disorders: Secondary | ICD-10-CM | POA: Diagnosis not present

## 2021-11-08 DIAGNOSIS — R0989 Other specified symptoms and signs involving the circulatory and respiratory systems: Secondary | ICD-10-CM | POA: Diagnosis not present

## 2021-11-08 DIAGNOSIS — I5189 Other ill-defined heart diseases: Secondary | ICD-10-CM | POA: Diagnosis not present

## 2021-11-08 DIAGNOSIS — I119 Hypertensive heart disease without heart failure: Secondary | ICD-10-CM | POA: Diagnosis not present

## 2021-11-08 DIAGNOSIS — Z885 Allergy status to narcotic agent status: Secondary | ICD-10-CM | POA: Diagnosis not present

## 2021-11-08 DIAGNOSIS — I63513 Cerebral infarction due to unspecified occlusion or stenosis of bilateral middle cerebral arteries: Secondary | ICD-10-CM | POA: Diagnosis not present

## 2021-11-08 DIAGNOSIS — R059 Cough, unspecified: Secondary | ICD-10-CM | POA: Diagnosis not present

## 2021-11-08 DIAGNOSIS — I63531 Cerebral infarction due to unspecified occlusion or stenosis of right posterior cerebral artery: Secondary | ICD-10-CM | POA: Diagnosis not present

## 2021-11-08 DIAGNOSIS — Z880 Allergy status to penicillin: Secondary | ICD-10-CM | POA: Diagnosis not present

## 2021-11-08 DIAGNOSIS — E1169 Type 2 diabetes mellitus with other specified complication: Secondary | ICD-10-CM | POA: Diagnosis not present

## 2021-11-08 DIAGNOSIS — I10 Essential (primary) hypertension: Secondary | ICD-10-CM | POA: Diagnosis not present

## 2021-11-08 DIAGNOSIS — E079 Disorder of thyroid, unspecified: Secondary | ICD-10-CM | POA: Diagnosis not present

## 2021-11-08 DIAGNOSIS — I639 Cerebral infarction, unspecified: Secondary | ICD-10-CM | POA: Diagnosis not present

## 2021-11-08 DIAGNOSIS — I63213 Cerebral infarction due to unspecified occlusion or stenosis of bilateral vertebral arteries: Secondary | ICD-10-CM | POA: Diagnosis not present

## 2021-11-08 DIAGNOSIS — K7400 Hepatic fibrosis, unspecified: Secondary | ICD-10-CM | POA: Diagnosis not present

## 2021-11-08 DIAGNOSIS — M4802 Spinal stenosis, cervical region: Secondary | ICD-10-CM | POA: Diagnosis not present

## 2021-11-08 DIAGNOSIS — I69391 Dysphagia following cerebral infarction: Secondary | ICD-10-CM | POA: Diagnosis not present

## 2021-11-08 DIAGNOSIS — I517 Cardiomegaly: Secondary | ICD-10-CM | POA: Diagnosis not present

## 2021-11-08 DIAGNOSIS — R1312 Dysphagia, oropharyngeal phase: Secondary | ICD-10-CM | POA: Diagnosis not present

## 2021-11-08 DIAGNOSIS — Z7982 Long term (current) use of aspirin: Secondary | ICD-10-CM | POA: Diagnosis not present

## 2021-11-08 DIAGNOSIS — Z20822 Contact with and (suspected) exposure to covid-19: Secondary | ICD-10-CM | POA: Diagnosis not present

## 2021-11-08 DIAGNOSIS — M79605 Pain in left leg: Secondary | ICD-10-CM | POA: Diagnosis not present

## 2021-11-08 DIAGNOSIS — I63233 Cerebral infarction due to unspecified occlusion or stenosis of bilateral carotid arteries: Secondary | ICD-10-CM | POA: Diagnosis not present

## 2021-11-08 DIAGNOSIS — R29810 Facial weakness: Secondary | ICD-10-CM | POA: Diagnosis not present

## 2021-11-08 DIAGNOSIS — J449 Chronic obstructive pulmonary disease, unspecified: Secondary | ICD-10-CM | POA: Diagnosis not present

## 2021-11-08 DIAGNOSIS — R29713 NIHSS score 13: Secondary | ICD-10-CM | POA: Diagnosis not present

## 2021-11-08 DIAGNOSIS — Z66 Do not resuscitate: Secondary | ICD-10-CM | POA: Diagnosis not present

## 2021-11-08 DIAGNOSIS — I69354 Hemiplegia and hemiparesis following cerebral infarction affecting left non-dominant side: Secondary | ICD-10-CM | POA: Diagnosis not present

## 2021-11-08 DIAGNOSIS — I6381 Other cerebral infarction due to occlusion or stenosis of small artery: Secondary | ICD-10-CM | POA: Diagnosis not present

## 2021-11-08 DIAGNOSIS — R1310 Dysphagia, unspecified: Secondary | ICD-10-CM | POA: Diagnosis not present

## 2021-11-08 DIAGNOSIS — E785 Hyperlipidemia, unspecified: Secondary | ICD-10-CM | POA: Diagnosis not present

## 2021-11-08 DIAGNOSIS — Z7902 Long term (current) use of antithrombotics/antiplatelets: Secondary | ICD-10-CM | POA: Diagnosis not present

## 2021-11-08 DIAGNOSIS — R531 Weakness: Secondary | ICD-10-CM | POA: Diagnosis not present

## 2021-11-08 DIAGNOSIS — I6782 Cerebral ischemia: Secondary | ICD-10-CM | POA: Diagnosis not present

## 2021-11-08 DIAGNOSIS — K219 Gastro-esophageal reflux disease without esophagitis: Secondary | ICD-10-CM | POA: Diagnosis not present

## 2021-11-08 DIAGNOSIS — Z91018 Allergy to other foods: Secondary | ICD-10-CM | POA: Diagnosis not present

## 2021-11-08 DIAGNOSIS — Z79899 Other long term (current) drug therapy: Secondary | ICD-10-CM | POA: Diagnosis not present

## 2021-11-18 ENCOUNTER — Encounter: Payer: Self-pay | Admitting: *Deleted

## 2021-11-18 NOTE — PMR Pre-admission (Shared)
PMR Admission Coordinator Pre-Admission Assessment  Patient: Elizabeth [name] is an 81 y.o., female MRN: [id number] DOB: July 22, 1941 Height:   Weight:    Insurance Information HMO:     PPO: yes     PCP:      IPA:      80/20:      OTHER:  PRIMARY: [employer] Medicare      Policy#: [id number]      Subscriber: pt CM Name: Philandria      Phone#: [phone] option #7     Fax#: [phone] Pre-Cert#: [id number] approved for 7 days      Employer:  Benefits:  Phone #: [phone]     Name: 11/15/21 Eff. Date: 09/08/2021     Deduct: none      Out of Pocket Max: $3900      Life Max: none CIR: $295 co pay per day days 1 until 6      SNF: no copay days 1 until 20; $196 co pay per day days 21 until 40; no co pay days 41 until 100 Outpatient: $20 per visit     Co-Pay: visits per medical neccesity Home Health: 100%      Co-Pay: visits per medical neccesity DME: 80%     Co-Pay: 20% Providers: in network  SECONDARY: none        Financial Counselor:       Phone#:   The [facility] for patients in Inpatient Rehabilitation Facilities with attached Privacy Act Pierz Records was provided and verbally reviewed with: Family  Emergency Contact Information Contact Information     Name Relation Home Work Mobile   Krizek,Robert L Spouse [phone]  [phone]      Current Medical History  Patient Admitting Diagnosis: CVA  History of Present Illness: 81 year old female with 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  Presented to ED on 11/08/2021 to [facility] with complaints of left facial droop and left upper extremity weakness.   She was outside of the TPA window and Tele neurologist was consulted. CTA of head and neck with no significant interval change since prior CTA on 06/10/2021. No intracranial large vessel occlusion. Unchanged moderate to severe stenosis with  the distal M1 right MCA. Progressive severe stenosis within an inferior division proximal M2 left MCA vessel. Progressive at least moderate stenosis within the P2 right PCA branch. Evidence of embolization coils from previous aneurysm treatments. On 11/11/21 MRI confirmed lacunar infarct in right internal capsule. ECHO normal . ED 55 % with grade 1 diastolic dysfunction. SLP for MBS  and revealed oropharyngeal dysphagia with moderate aspiration of thin liquids due to poor oral control and delayed swallow. Pureed level 4 diet with mildy thickened liquids, level 2. Patient placed on dual antiplatelet therapy of Asa and Plavix.  Zio patch is recommended at discharge. Cholesterol 243 and LDL of 185. Statin therapy recommended but patient reports intolerance and has bad reactions to all therefore not started. She takes Norvasc at home and it is continued. Hgb A1c 6.4. Taking Mucinex and Zyrtec for congestion , she reports history of seasonal allergies.   Patient's medical record from [facility] has been reviewed by the rehabilitation admission coordinator and physician.  Past Medical History  Past Medical History:  Diagnosis Date   Absolute anemia 07/28/2018   Active smoker    Allergy    Anxiety  Takes Xanax for anxiety   Arthritis    Asthma    Cataract    Chronic airway obstruction, not elsewhere classified    Clotting disorder (HCC)    stoke    Depression    Diarrhea    Diverticulosis of colon (without mention of hemorrhage)    Esophageal reflux    Family history of malignant neoplasm of gastrointestinal tract    GI bleed    Headache(784.0)    Irregular   Neuromuscular disorder (HCC)    numbness is left hand and left elbow   Obesity, unspecified    Other and unspecified hyperlipidemia    Personal history of colonic polyps    Stricture and stenosis of esophagus    Stroke ([place])    Thyroid disease    Type II or unspecified type diabetes mellitus without mention of  complication, not stated as uncontrolled    Unspecified asthma(493.90)    Unspecified essential hypertension    Has the patient had major surgery during 100 days prior to admission? No  Family History   family history includes Asthma in her mother; Cancer in her paternal grandmother and sister; Colon cancer in an other family member; Diabetes in her mother, paternal grandmother, and sister; Drug abuse in her mother; Early death in her father; Heart attack in her sister; Heart disease in her brother, maternal grandmother, mother, paternal grandfather, and sister; Hypertension in her mother and sister; Kidney disease in her mother; Lung disease in her brother; Mental illness in her paternal grandmother and sister; Stroke in her mother; Thyroid cancer in an other family member; Tuberculosis in her maternal grandfather.  Current Medications  Current Outpatient Medications:    albuterol (VENTOLIN HFA) 108 (90 Base) MCG/ACT inhaler, INHALE 2 PUFFS EVERY SIX HOURS AS NEEDED FOR WHEEZING OR SHORTNESS OF BREATH, Disp: 8.5 g, Rfl: 2   amLODipine (NORVASC) 10 MG tablet, TAKE 1 TABLET BY MOUTH EVERY DAY, Disp: 90 tablet, Rfl: 5   aspirin 325 MG tablet, Take 325 mg by mouth daily., Disp: , Rfl:    Carboxymethylcellulose Sodium 1 % GEL, Place 1 drop into both eyes at bedtime., Disp: , Rfl:    chlorpheniramine (CHLOR-TRIMETON) 4 MG tablet, Take 2-4 mg by mouth at bedtime as needed for allergies., Disp: , Rfl:    CINNAMON PO, Take 1,000 mg by mouth daily as needed (for blood sugars). , Disp: , Rfl:    clopidogrel (PLAVIX) 75 MG tablet, TAKE ONE TABLET BY MOUTH DAILY, Disp: 90 tablet, Rfl: 1   empagliflozin (JARDIANCE) 10 MG TABS tablet, Take 1 tablet (10 mg total) by mouth daily before breakfast., Disp: 14 tablet, Rfl: 0   OVER THE COUNTER MEDICATION, Grape seed oil capsule one daily, Disp: , Rfl:    OVER THE COUNTER MEDICATION, Herb for blood sugar ( unsure of name), Disp: , Rfl:    pantoprazole (PROTONIX) 40  MG tablet, TAKE 1 TABLET BY MOUTH DAILY BEFORE BREAKFAST, Disp: 30 tablet, Rfl: 5   sodium chloride (OCEAN) 0.65 % SOLN nasal spray, Place 1 spray into both nostrils as needed for congestion., Disp: , Rfl:    ursodiol (ACTIGALL) 300 MG capsule, Take 1 capsule (300 mg total) by mouth 3 (three) times daily., Disp: 270 capsule, Rfl: 3  Patients Current Diet: Diet IDDSI level 4 puree and level 2 mildly thick ( nectar) liquids  Precautions / Restrictions Precautions: Fall (Has fallen on acute) Precautions/Special Needs: Other Precaution Comments: aspiration precautions Weight Bearing Restrictions: No   Has the  patient had 2 or more falls or a fall with injury in the past year? 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 Independent  Indoor Mobility: Did the patient need assistance with walking from room to room (with or without device)? Independent  Stairs: Did the patient need assistance with internal or external stairs (with or without device)? Independent  Functional Cognition: Did the patient need help planning regular tasks such as shopping or remembering to take medications? Independent  Patient Information Are you of Hispanic, Latino/a,or Spanish origin?: A. No, not of Hispanic, Latino/a, or Spanish origin What is your race?: A. White Do you need or want an interpreter to communicate with a doctor or health care staff?: 0. No  Patient's Response To:  Health Literacy and Transportation Is the patient able to respond to health literacy and transportation needs?: Yes Health Literacy - How often do you need to have someone help you when you read instructions, pamphlets, or other written material from your doctor or pharmacy?: Never In the past 12 months, has lack of transportation kept you from medical appointments or  from getting medications?: No In the past 12 months, has lack of transportation kept you from meetings, work, or from getting things needed for daily living?: No  Home Assistive Devices / Equipment None   Prior Device Use: Indicate devices/aids used by the patient prior to current illness, exacerbation or injury? None of the above   Prior Functional Level Current Functional Level  Bed Mobility  Independent  Mod assist (LUE and LLE feel heavy, failed to lift and advance LE off bed, lose balance to left when sit EOB)   Transfers  Independent  Mod assist (mod assist of 2 people)  Mobility - Walk/Wheelchair  Independent  Total assist to stand pivot. Failed to initiate anterior weight shift when standing and demo hip and trunk extension. Attempted to ambulate, but LLE continues to buckle with attempts to WB.   Upper Body Dressing  Independent  Mod assist   Lower Body Dressing  Independent  Max assist   Grooming  Independent  Mod assist   Eating/Drinking  Independent  Mod assist   Toilet Transfer  Independent  Mod assist   Bladder Continence   continent  incontinent   Bowel Management  continent  incontinent   Stair Climbing  Independent  Total assist   Communication  independent  impaired   Memory  independent  impaired     Special Needs/ Care Considerations Hgb A1c 6.4 Fall and aspiration precautions Family having been staying at bedside 24/7 Likely to need Zio patch at discharge  Previous Home Environment  Living Arrangements: Spouse/significant other  Lives With: Spouse Available Help at Discharge: Family; Available 24 hours/day Type of Home: House Home Layout: One level Home Access: Stairs to enter Entrance Stairs-Rails: Left Entrance Stairs-Number of Steps: 2 Bathroom Shower/Tub: Multimedia programmer: Standard Bathroom Accessibility: Yes How Accessible: Accessible via walker Home Care Services: No Additional Comments:  daughter lives next door, [place]. Two adult granddaughters also available  She is a retired Psychiatrist for Discharge Living Setting: Patient's home; Lives with (comment) (spouse) Type of Home at Discharge: House Discharge Home Layout: One level Discharge Home Access: Stairs to enter Entrance Stairs-Rails: Left Entrance Stairs-Number of Steps: 2 Discharge Bathroom Shower/Tub: Walk-in shower Discharge [place]  Toilet: Standard Discharge Bathroom Accessibility: Yes How Accessible: Accessible via walker Does the patient have any problems obtaining your medications?: No  Social/Family/Support Systems Patient Roles: Spouse Contact Information: spouse , [name] Anticipated Caregiver: spouse, daughter and granddaughters Anticipated Caregiver's Contact Information: [phone] Ability/Limitations of Caregiver: no limitations Caregiver Availability: 24/7 Discharge Plan Discussed with Primary Caregiver: Yes Is Caregiver In Agreement with Plan?: Yes Does Caregiver/Family have Issues with Lodging/Transportation while Pt is in Rehab?: No  Goals Patient/Family Goal for Rehab: min assist with PT and OT, supervision with SLP Expected length of stay: ELOS 14 to 20 days Pt/Family Agrees to Admission and willing to participate: Yes Program Orientation Provided & Reviewed with Pt/Caregiver Including Roles  & Responsibilities: Yes  Decrease burden of Care through IP rehab admission: n/a  Possible need for SNF placement upon discharge: not anticipated  Patient Condition: I have reviewed medical records from [facility], spoken with CM, and spouse. I discussed via phone for inpatient rehabilitation assessment.  Patient will benefit from ongoing PT, OT, and SLP, can actively participate in 3 hours of therapy a day 5 days of the week, and can make measurable gains during the admission.  Patient will also benefit from the coordinated team approach during an Inpatient  Acute Rehabilitation admission.  The patient will receive intensive therapy as well as Rehabilitation physician, nursing, social worker, and care management interventions.  Due to bladder management, bowel management, safety, skin/wound care, disease management, medication administration, pain management, and patient education the patient requires 24 hour a day rehabilitation nursing.  The patient is currently mot to max assist overall with mobility and basic ADLs.  Discharge setting and therapy post discharge at home with home health is anticipated.  Patient has agreed to participate in the Acute Inpatient Rehabilitation Program and will admit today.  Preadmission Screen Completed By:  Cleatrice Burke, 11/18/2021 5:42 PM ______________________________________________________________________   Discussed status with Dr. Marland Kitchen on *** at *** and received approval for admission today.  Admission Coordinator:  Cleatrice Burke, RN, time Marland KitchenSudie Grumbling ***   Assessment/Plan: Diagnosis: Does the need for close, 24 hr/day Medical supervision in concert with the patient's rehab needs make it unreasonable for this patient to be served in a less intensive setting? {yes_no_potentially:3041433} Co-Morbidities requiring supervision/potential complications: *** Due to {due DQ:2229798}, does the patient require 24 hr/day rehab nursing? {yes_no_potentially:3041433} Does the patient require coordinated care of a physician, rehab nurse, PT, OT, and SLP to address physical and functional deficits in the context of the above medical diagnosis(es)? {yes_no_potentially:3041433} Addressing deficits in the following areas: {deficits:3041436} Can the patient actively participate in an intensive therapy program of at least 3 hrs of therapy 5 days a week? {yes_no_potentially:3041433} The potential for patient to make measurable gains while on inpatient rehab is {potential:3041437} Anticipated functional outcomes upon  discharge from inpatient rehab: {functional outcomes:304600100} PT, {functional outcomes:304600100} OT, {functional outcomes:304600100} SLP Estimated rehab length of stay to reach the above functional goals is: *** Anticipated discharge destination: {anticipated dc setting:21604} 10. Overall Rehab/Functional Prognosis: {potential:3041437}  MD Signature ***

## 2021-11-19 ENCOUNTER — Inpatient Hospital Stay (HOSPITAL_COMMUNITY)
Admission: RE | Admit: 2021-11-19 | Discharge: 2021-12-11 | DRG: 057 | Disposition: A | Discharge status: Home Health | Payer: Medicare Other | Source: Other Acute Inpatient Hospital | Attending: Physical Medicine & Rehabilitation | Admitting: Physical Medicine & Rehabilitation

## 2021-11-19 ENCOUNTER — Other Ambulatory Visit: Payer: Self-pay | Admitting: Physician Assistant

## 2021-11-19 DIAGNOSIS — I6381 Other cerebral infarction due to occlusion or stenosis of small artery: Secondary | ICD-10-CM | POA: Diagnosis not present

## 2021-11-19 DIAGNOSIS — Z823 Family history of stroke: Secondary | ICD-10-CM

## 2021-11-19 DIAGNOSIS — Z800 Family history of malignant neoplasm of digestive organs: Secondary | ICD-10-CM

## 2021-11-19 DIAGNOSIS — E669 Obesity, unspecified: Secondary | ICD-10-CM | POA: Diagnosis not present

## 2021-11-19 DIAGNOSIS — Z7902 Long term (current) use of antithrombotics/antiplatelets: Secondary | ICD-10-CM | POA: Diagnosis not present

## 2021-11-19 DIAGNOSIS — G8929 Other chronic pain: Secondary | ICD-10-CM | POA: Diagnosis present

## 2021-11-19 DIAGNOSIS — Z66 Do not resuscitate: Secondary | ICD-10-CM | POA: Diagnosis not present

## 2021-11-19 DIAGNOSIS — M6250 Muscle wasting and atrophy, not elsewhere classified, unspecified site: Secondary | ICD-10-CM | POA: Diagnosis not present

## 2021-11-19 DIAGNOSIS — Z882 Allergy status to sulfonamides status: Secondary | ICD-10-CM

## 2021-11-19 DIAGNOSIS — E119 Type 2 diabetes mellitus without complications: Secondary | ICD-10-CM | POA: Diagnosis present

## 2021-11-19 DIAGNOSIS — R1312 Dysphagia, oropharyngeal phase: Secondary | ICD-10-CM | POA: Diagnosis present

## 2021-11-19 DIAGNOSIS — R252 Cramp and spasm: Secondary | ICD-10-CM | POA: Diagnosis present

## 2021-11-19 DIAGNOSIS — I69354 Hemiplegia and hemiparesis following cerebral infarction affecting left non-dominant side: Principal | ICD-10-CM

## 2021-11-19 DIAGNOSIS — I69398 Other sequelae of cerebral infarction: Secondary | ICD-10-CM

## 2021-11-19 DIAGNOSIS — J449 Chronic obstructive pulmonary disease, unspecified: Secondary | ICD-10-CM | POA: Diagnosis not present

## 2021-11-19 DIAGNOSIS — Z888 Allergy status to other drugs, medicaments and biological substances status: Secondary | ICD-10-CM

## 2021-11-19 DIAGNOSIS — Z91018 Allergy to other foods: Secondary | ICD-10-CM

## 2021-11-19 DIAGNOSIS — N318 Other neuromuscular dysfunction of bladder: Secondary | ICD-10-CM | POA: Diagnosis not present

## 2021-11-19 DIAGNOSIS — E785 Hyperlipidemia, unspecified: Secondary | ICD-10-CM | POA: Diagnosis present

## 2021-11-19 DIAGNOSIS — K743 Primary biliary cirrhosis: Secondary | ICD-10-CM | POA: Diagnosis not present

## 2021-11-19 DIAGNOSIS — B9620 Unspecified Escherichia coli [E. coli] as the cause of diseases classified elsewhere: Secondary | ICD-10-CM | POA: Diagnosis not present

## 2021-11-19 DIAGNOSIS — I69391 Dysphagia following cerebral infarction: Secondary | ICD-10-CM

## 2021-11-19 DIAGNOSIS — Z794 Long term (current) use of insulin: Secondary | ICD-10-CM | POA: Diagnosis not present

## 2021-11-19 DIAGNOSIS — J302 Other seasonal allergic rhinitis: Secondary | ICD-10-CM | POA: Diagnosis not present

## 2021-11-19 DIAGNOSIS — K219 Gastro-esophageal reflux disease without esophagitis: Secondary | ICD-10-CM | POA: Diagnosis present

## 2021-11-19 DIAGNOSIS — Z885 Allergy status to narcotic agent status: Secondary | ICD-10-CM | POA: Diagnosis not present

## 2021-11-19 DIAGNOSIS — Z825 Family history of asthma and other chronic lower respiratory diseases: Secondary | ICD-10-CM

## 2021-11-19 DIAGNOSIS — I10 Essential (primary) hypertension: Secondary | ICD-10-CM | POA: Diagnosis present

## 2021-11-19 DIAGNOSIS — Z86010 Personal history of colonic polyps: Secondary | ICD-10-CM

## 2021-11-19 DIAGNOSIS — M779 Enthesopathy, unspecified: Secondary | ICD-10-CM | POA: Diagnosis not present

## 2021-11-19 DIAGNOSIS — E876 Hypokalemia: Secondary | ICD-10-CM | POA: Diagnosis not present

## 2021-11-19 DIAGNOSIS — I69392 Facial weakness following cerebral infarction: Secondary | ICD-10-CM

## 2021-11-19 DIAGNOSIS — I6350 Cerebral infarction due to unspecified occlusion or stenosis of unspecified cerebral artery: Secondary | ICD-10-CM | POA: Diagnosis not present

## 2021-11-19 DIAGNOSIS — Z833 Family history of diabetes mellitus: Secondary | ICD-10-CM

## 2021-11-19 DIAGNOSIS — M25561 Pain in right knee: Secondary | ICD-10-CM | POA: Diagnosis present

## 2021-11-19 DIAGNOSIS — R531 Weakness: Secondary | ICD-10-CM | POA: Diagnosis not present

## 2021-11-19 DIAGNOSIS — I639 Cerebral infarction, unspecified: Secondary | ICD-10-CM

## 2021-11-19 DIAGNOSIS — Z841 Family history of disorders of kidney and ureter: Secondary | ICD-10-CM

## 2021-11-19 DIAGNOSIS — N319 Neuromuscular dysfunction of bladder, unspecified: Secondary | ICD-10-CM | POA: Diagnosis not present

## 2021-11-19 DIAGNOSIS — Z79899 Other long term (current) drug therapy: Secondary | ICD-10-CM | POA: Diagnosis not present

## 2021-11-19 DIAGNOSIS — A499 Bacterial infection, unspecified: Secondary | ICD-10-CM | POA: Diagnosis not present

## 2021-11-19 DIAGNOSIS — E1169 Type 2 diabetes mellitus with other specified complication: Secondary | ICD-10-CM | POA: Diagnosis not present

## 2021-11-19 DIAGNOSIS — Z7982 Long term (current) use of aspirin: Secondary | ICD-10-CM | POA: Diagnosis not present

## 2021-11-19 DIAGNOSIS — F419 Anxiety disorder, unspecified: Secondary | ICD-10-CM | POA: Diagnosis present

## 2021-11-19 DIAGNOSIS — Z880 Allergy status to penicillin: Secondary | ICD-10-CM

## 2021-11-19 DIAGNOSIS — D649 Anemia, unspecified: Secondary | ICD-10-CM | POA: Diagnosis not present

## 2021-11-19 DIAGNOSIS — Z87891 Personal history of nicotine dependence: Secondary | ICD-10-CM

## 2021-11-19 DIAGNOSIS — E079 Disorder of thyroid, unspecified: Secondary | ICD-10-CM | POA: Diagnosis not present

## 2021-11-19 DIAGNOSIS — R059 Cough, unspecified: Secondary | ICD-10-CM | POA: Diagnosis not present

## 2021-11-19 DIAGNOSIS — N390 Urinary tract infection, site not specified: Secondary | ICD-10-CM | POA: Diagnosis not present

## 2021-11-19 DIAGNOSIS — I69322 Dysarthria following cerebral infarction: Secondary | ICD-10-CM

## 2021-11-19 DIAGNOSIS — Z8249 Family history of ischemic heart disease and other diseases of the circulatory system: Secondary | ICD-10-CM

## 2021-11-19 LAB — GLUCOSE, CAPILLARY
Glucose-Capillary: 140 mg/dL — ABNORMAL HIGH (ref 70–99)
Glucose-Capillary: 92 mg/dL (ref 70–99)

## 2021-11-19 MED ORDER — ENOXAPARIN SODIUM 40 MG/0.4ML IJ SOSY
40.0000 mg | PREFILLED_SYRINGE | INTRAMUSCULAR | Status: DC
  Administered 2021-11-19 – 2021-12-10 (×22): 40 mg via SUBCUTANEOUS
  Filled 2021-11-19 (×22): qty 0.4

## 2021-11-19 MED ORDER — ALBUTEROL SULFATE (2.5 MG/3ML) 0.083% IN NEBU: 2.5000 mg | INHALATION_SOLUTION | Freq: Four times a day (QID) | RESPIRATORY_TRACT | Status: DC | PRN

## 2021-11-19 MED ORDER — PROCHLORPERAZINE 25 MG RE SUPP: 12.5000 mg | Freq: Four times a day (QID) | RECTAL | Status: DC | PRN

## 2021-11-19 MED ORDER — TRAZODONE HCL 50 MG PO TABS
25.0000 mg | ORAL_TABLET | Freq: Every evening | ORAL | Status: DC | PRN
  Administered 2021-11-27 – 2021-12-06 (×2): 50 mg via ORAL
  Filled 2021-11-19 (×2): qty 1

## 2021-11-19 MED ORDER — ACETAMINOPHEN 325 MG PO TABS
325.0000 mg | ORAL_TABLET | ORAL | Status: DC | PRN
  Administered 2021-11-28 – 2021-12-06 (×2): 650 mg via ORAL
  Filled 2021-11-19 (×3): qty 2

## 2021-11-19 MED ORDER — BLOOD PRESSURE CONTROL BOOK
Freq: Once | Status: AC
  Filled 2021-11-19: qty 1

## 2021-11-19 MED ORDER — LIVING WELL WITH DIABETES BOOK
Freq: Once | Status: AC
  Filled 2021-11-19: qty 1

## 2021-11-19 MED ORDER — ASPIRIN EC 81 MG PO TBEC
81.0000 mg | DELAYED_RELEASE_TABLET | Freq: Every day | ORAL | Status: DC
  Filled 2021-11-19: qty 1

## 2021-11-19 MED ORDER — LORATADINE 10 MG PO TABS
10.0000 mg | ORAL_TABLET | Freq: Every day | ORAL | Status: DC
Start: 2021-11-20 — End: 2021-12-11
  Administered 2021-11-20 – 2021-12-11 (×22): 10 mg via ORAL
  Filled 2021-11-19 (×22): qty 1

## 2021-11-19 MED ORDER — GUAIFENESIN-DM 100-10 MG/5ML PO SYRP
5.0000 mL | ORAL_SOLUTION | Freq: Four times a day (QID) | ORAL | Status: DC | PRN
  Filled 2021-11-19: qty 10

## 2021-11-19 MED ORDER — FLEET ENEMA 7-19 GM/118ML RE ENEM: 1.0000 | ENEMA | Freq: Once | RECTAL | Status: DC | PRN

## 2021-11-19 MED ORDER — PROCHLORPERAZINE EDISYLATE 10 MG/2ML IJ SOLN: 5.0000 mg | Freq: Four times a day (QID) | INTRAMUSCULAR | Status: DC | PRN

## 2021-11-19 MED ORDER — METHOCARBAMOL 500 MG PO TABS
500.0000 mg | ORAL_TABLET | Freq: Four times a day (QID) | ORAL | Status: DC | PRN
  Administered 2021-12-09 – 2021-12-10 (×3): 500 mg via ORAL
  Filled 2021-11-19 (×3): qty 1

## 2021-11-19 MED ORDER — PROCHLORPERAZINE MALEATE 5 MG PO TABS: 5.0000 mg | ORAL_TABLET | Freq: Four times a day (QID) | ORAL | Status: DC | PRN

## 2021-11-19 MED ORDER — CLOPIDOGREL BISULFATE 75 MG PO TABS
75.0000 mg | ORAL_TABLET | Freq: Every day | ORAL | Status: DC
  Administered 2021-11-20 – 2021-12-11 (×22): 75 mg via ORAL
  Filled 2021-11-19 (×22): qty 1

## 2021-11-19 MED ORDER — MELATONIN 3 MG PO TABS
3.0000 mg | ORAL_TABLET | Freq: Every evening | ORAL | Status: DC | PRN
  Administered 2021-11-27 – 2021-12-05 (×2): 3 mg via ORAL
  Filled 2021-11-19 (×2): qty 1

## 2021-11-19 MED ORDER — PANTOPRAZOLE SODIUM 40 MG PO TBEC
40.0000 mg | DELAYED_RELEASE_TABLET | Freq: Every day | ORAL | Status: DC
  Administered 2021-11-22 – 2021-12-11 (×20): 40 mg via ORAL
  Filled 2021-11-19 (×22): qty 1

## 2021-11-19 MED ORDER — MAGNESIUM HYDROXIDE 400 MG/5ML PO SUSP
30.0000 mL | Freq: Every day | ORAL | Status: DC | PRN
  Filled 2021-11-19: qty 30

## 2021-11-19 MED ORDER — SORBITOL 70 % SOLN
30.0000 mL | Freq: Every day | Status: DC | PRN
Start: 2021-11-19 — End: 2021-12-11
  Administered 2021-11-21: 30 mL via ORAL
  Filled 2021-11-19 (×2): qty 30

## 2021-11-19 MED ORDER — DIPHENHYDRAMINE HCL 12.5 MG/5ML PO ELIX
12.5000 mg | ORAL_SOLUTION | Freq: Four times a day (QID) | ORAL | Status: DC | PRN
  Administered 2021-11-26: 12.5 mg via ORAL
  Administered 2021-11-30 – 2021-12-10 (×7): 25 mg via ORAL
  Filled 2021-11-19 (×6): qty 10
  Filled 2021-11-19: qty 0
  Filled 2021-11-19 (×2): qty 10

## 2021-11-19 MED ORDER — INSULIN ASPART 100 UNIT/ML IJ SOLN
0.0000 [IU] | Freq: Three times a day (TID) | INTRAMUSCULAR | Status: DC
  Administered 2021-11-19 – 2021-11-30 (×16): 2 [IU] via SUBCUTANEOUS
  Administered 2021-12-02: 3 [IU] via SUBCUTANEOUS
  Administered 2021-12-02 – 2021-12-10 (×8): 2 [IU] via SUBCUTANEOUS

## 2021-11-19 MED ORDER — ALUM & MAG HYDROXIDE-SIMETH 200-200-20 MG/5ML PO SUSP: 30.0000 mL | ORAL | Status: DC | PRN

## 2021-11-19 NOTE — H&P (Signed)
? ? ?Physical Medicine and Rehabilitation Admission H&P ? ?  ?No chief complaint on file. ?CC: Functional deficits secondary to lacunar infarct of right internal capsule ? ?HPI: Elizabeth [name] is a 81 year old female who presented to [facility] on 11/08/2021 with an approximate 4 to 5-hour onset of left facial droop and left upper extremity weakness.  Telemetry neurologist was consulted and CT head without contrast and CTA performed.  No evidence of acute infarction, hemorrhage or hydrocephalus, extra-axial collection or mass lesion mass effect noted.  MRI not available.  Consequently, she was transferred to [facility], however no bed was available.  The patient remained in the emergency department at [facility] and on 3/6 MRI was performed and confirmed lacunar infarct in the right internal capsule.  No associated hemorrhage or mass effect.  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  Plavix and aspirin initiated.  Patient intolerant of all statins. Zio patch deferred until discharge.  Hemodynamically stable. The patient requires inpatient physical medicine and rehabilitation evaluations and treatment secondary to dysfunction due to lacunar infarct with left-sided weakness and dysphagia. Currently requiring mod to max assist. ?  ?The patient's history is significant for prior CVA approximately 9 years ago with cerebral embolization coils placed.  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  ? ? he patient underwent venous duplex of the left lower extremities on 3/7 without evidence of deep venous thrombosis.  CTA of the neck revealed patent common carotid and internal carotid arteries without hemodynamically significant stenoses.  Modified barium swallow revealed oropharyngeal dysphagia with moderate aspiration of thin liquids.  She is tolerating a pur?ed level 4 diet with mildly thick  liquids, level 2.  2D echocardiogram was negative for PFO, positive for grade 1 diastolic dysfunction with overall left ventricular ejection fraction (55%). Hemoglobin A1c is 6.4%. LDL is 185. TSH is 1.735 ? ?Review of Systems  ?Constitutional:  Negative for chills and fever.  ?HENT:  Negative for hearing loss and tinnitus.   ?Eyes:  Negative for blurred vision and double vision.  ?Respiratory:  Positive for cough. Negative for shortness of breath.   ?Cardiovascular:  Negative for chest pain and palpitations.  ?Gastrointestinal:  Negative for nausea and vomiting.  ?Genitourinary:  Negative for dysuria.  ?Musculoskeletal:  Negative for myalgias.  ?Skin:  Negative for itching.  ?Neurological:  Positive for sensory change, speech change and focal weakness.  ?Psychiatric/Behavioral:  Negative for depression and suicidal ideas.   ?Past Medical History:  ?Diagnosis Date  ? Absolute anemia 07/28/2018  ? Active smoker   ? Allergy   ? Anxiety   ? Takes Xanax for anxiety  ? Arthritis   ? Asthma   ? Cataract   ? Chronic airway obstruction, not elsewhere classified   ? Clotting disorder ([place])   ? stoke   ? Depression   ? Diarrhea   ? Diverticulosis of colon (without mention of hemorrhage)   ? Esophageal reflux   ? Family history of malignant neoplasm of gastrointestinal tract   ? GI bleed   ? Headache(784.0)   ? Irregular  ? Neuromuscular disorder (HCC)   ? numbness is left hand and left elbow  ? Obesity, unspecified   ? Other and unspecified hyperlipidemia   ? Personal history of colonic polyps   ? Stricture and stenosis of esophagus   ? Stroke [facility])   ? Thyroid disease   ?  Type II or unspecified type diabetes mellitus without mention of complication, not stated as uncontrolled   ? Unspecified asthma(493.90)   ? Unspecified essential hypertension   ? ?Past Surgical History:  ?Procedure Laterality Date  ? APPENDECTOMY    ? BACK SURGERY    ? Spinal surgery  ? BRAIN SURGERY    ? 2013 - stoke ( fall in 2010)   ? CARDIAC  CATHETERIZATION  2006  ? LAD: 30%, RCA : 20%, normal EF, elevated LVEDP  ? CARDIAC CATHETERIZATION    ? CEREBRAL ANGIOGRAM  12/20/2015  ? COLONOSCOPY N/A 08/09/2018  ? Procedure: COLONOSCOPY;  Surgeon: Rogene Houston, MD;  Location: [facility];  Service: Endoscopy;  Laterality: N/A;  1:55  ? ESOPHAGEAL DILATION N/A 08/09/2018  ? Procedure: ESOPHAGEAL DILATION;  Surgeon: Rogene Houston, MD;  Location: [facility];  Service: Endoscopy;  Laterality: N/A;  ? ESOPHAGOGASTRODUODENOSCOPY N/A 08/09/2018  ? Procedure: ESOPHAGOGASTRODUODENOSCOPY (EGD);  Surgeon: Rogene Houston, MD;  Location: [facility];  Service: Endoscopy;  Laterality: N/A;  ? GIVENS CAPSULE STUDY N/A 08/25/2018  ? Procedure: GIVENS CAPSULE STUDY;  Surgeon: Rogene Houston, MD;  [facility]: [facility];  Service: Endoscopy;  Laterality: N/A;  ? IR ANGIO INTRA EXTRACRAN SEL COM CAROTID INNOMINATE BILAT MOD SED  01/14/2018  ? IR ANGIO INTRA EXTRACRAN SEL COM CAROTID INNOMINATE BILAT MOD SED  05/11/2018  ? IR ANGIO VERTEBRAL SEL SUBCLAVIAN INNOMINATE UNI L MOD SED  01/14/2018  ? IR ANGIO VERTEBRAL SEL SUBCLAVIAN INNOMINATE UNI L MOD SED  05/11/2018  ? IR ANGIO VERTEBRAL SEL VERTEBRAL UNI R MOD SED  01/14/2018  ? IR ANGIO VERTEBRAL SEL VERTEBRAL UNI R MOD SED  05/11/2018  ? KNEE ARTHROSCOPY    ? left  ? LUMBAR DISC SURGERY    ? POLYPECTOMY  08/09/2018  ? Procedure: POLYPECTOMY;  Surgeon: Rogene Houston, MD;  Location: [facility];  Service: Endoscopy;;  ascending colon (CS x 2, HS x5) hepatic flexure (HSx1), recto-sigmoid (HSx1)  ? TONSILLECTOMY    ? TOTAL ABDOMINAL HYSTERECTOMY    ? menopause/bleeding  ? ?Family History  ?Problem Relation Age of Onset  ? Diabetes Mother   ? Heart disease Mother   ? Asthma Mother   ? Kidney disease Mother   ? Drug abuse Mother   ? Hypertension Mother   ? Stroke Mother   ?     fall -several bleed on brain 1990  ? Early death Father   ?     trauma  ? Colon cancer Other   ?     first cousin, paternal aunt and uncle  ? Cancer  Paternal Grandmother   ?     gastric  ? Diabetes Paternal Grandmother   ? Mental illness Paternal Grandmother   ? Heart disease Maternal Grandmother   ? Thyroid cancer Other   ?     aunt  ? Heart disease Sister   ? Mental illness Sister   ? Heart attack Sister   ? Heart disease Brother   ? Lung disease Brother   ? Cancer Sister   ?     pancreatic  ? Diabetes Sister   ? Hypertension Sister   ? Tuberculosis Maternal Grandfather   ? Heart disease Paternal Grandfather   ? ?Social History:  reports that she quit smoking about a year ago. Her smoking use included cigarettes. She started smoking about 38 years ago. She has a 20.00 pack-year smoking history. She has never  used smokeless tobacco. She reports that she does not drink alcohol and does not use drugs. ?Allergies:  ?Allergies  ?Allergen Reactions  ? Banana Anaphylaxis  ? Codeine Nausea And Vomiting and Other (See Comments)  ?  PROJECTILE VOMITING  ? Stevia [Stevioside] Swelling and Other (See Comments)  ?  Face, tongue, and eye swelling   ? Actos [Pioglitazone] Swelling  ? Benicar [Olmesartan Medoxomil] Other (See Comments)  ?  Feel bad  ? Chantix [Varenicline] Other (See Comments)  ?  "Worked opposite."  ? Diovan [Valsartan] Other (See Comments)  ?  Feel bad  ? Lipitor [Atorvastatin] Other (See Comments)  ?  REACTION:  Joint pain  ? Spinach Other (See Comments)  ?  Stomach problems  ? Vicodin [Hydrocodone-Acetaminophen] Nausea And Vomiting  ? Zocor [Simvastatin] Other (See Comments)  ?  REACTION:  "made liver function incorrectly"  ? Ace Inhibitors Cough  ? Celebrex [Celecoxib] Rash  ? Latex Rash and Other (See Comments)  ?  REACTION: red rash  ? Metformin And Related   ?  GI - upset   ? Penicillins Rash and Other (See Comments)  ?  Has patient had a PCN reaction causing immediate rash, facial/tongue/throat swelling, SOB or lightheadedness with hypotension: No ?Has patient had a PCN reaction causing severe rash involving mucus membranes or skin necrosis: No ?Has  patient had a PCN reaction that required hospitalization: No- MD office ?Has patient had a PCN reaction occurring within the last 10 years: No ?If all of the above answers are "NO", then may proceed with Cephalospo

## 2021-11-19 NOTE — Progress Notes (Signed)
INPATIENT REHABILITATION ADMISSION NOTE ? ? ?Arrival Method: Via ambulance at 1240pm  ? ?   ?Mental Orientation: A/Ox4, Slurred speech  ? ? ?Assessment: Complete ? ? ?Skin: Intact ? ? ?IV'S: None ? ? ?Pain: None ? ? ?Tubes and Drains: None ? ? ?Safety Measures: 3 side rails up, bed alarm on, call bell within reach  ? ? ?Vital Signs: Complete ? ? ?Height and Weight: Complete ? ? ?Rehab Orientation: Martin Majestic over with patient and husband. No questions or concerns at this time. ? ? ?Family: Notified and husband at bedside  ? ? ?Arn Medal LPN ?

## 2021-11-19 NOTE — Progress Notes (Signed)
[name], RN ?Rehab Admission Coordinator ?Physical Medicine and Rehabilitation ?PMR Pre-admission ?Signed ?Encounter Date:  11/18/2021 ? Related encounter: Documentation from 11/18/2021 in [place] ?  ?Signed    ?  ?Show:Clear all ?'[x]'$ Written'[x]'$ Templated'[]'$ Copied ? ?Added by: ?'[x]'$ Kayliee Atienza, Vertis Kelch, RN'[x]'$ Meredith Staggers, MD ? ?'[]'$ Hover for details ?   ?   ?   ?   ?   ?   ?   ?   ?   ?   ?   ?   ?   ?   ?   ?   ?   ?   ?   ?   ?   ?   ?   ?   ?   ?   ?   ?   ?   ?   ?   ?   ?   ?   ?   ?   ?   ?   ?   ?   ?   ?   ?   ?   ?   ?   ?   ?   ?   ?   ?   ?   ?   ?   ?   ?   ?   ?   ?   ?   ?   ?   ?   ?   ?   ?   ?   ?   ?   ?   ?   ?   ?   ?   ?   ?   ?   ?   ?   ?   ?   ?   ?   ?   ?   ?   ?   ?   ?   ?   ?   ?   ?   ?   ?   ?   ?   ?   ?   ?   ?   ?   ?   ?   ?   ?   ?   ?   ?   ?   ?   ?   ?   ?   ?   ?   ?   ?   ?   ?   ?   ?   ?   ?   ?   ?   ?   ?   ?   ?   ?   ?   ?   ?   ?   ?   ?   ?   ?   ?   ?   ?   ?   ?   ?   ?   ?   ?   ?   ?   ?   ?   ?   ?   ?   ?   ?   ?   ?   ?   ?   ?   ?   ?   ?   ?   ?   ?   ?   ?   ?   ?   ?   ?   ?   ?   ?   ?   ?   ?   ?   ?   ?   ?  PMR Admission Coordinator Pre-Admission Assessment ?  ?Patient: Elizabeth [name] is an 81 y.o., female ?MRN: [id number] ?DOB: 30-Apr-1941 ?Height:   ?Weight:   ?  ?Insurance Information ?HMO:     PPO: yes     PCP:      IPA:      80/20:      OTHER:  ?PRIMARY: [employer] Medicare      Policy#: [id number]      Subscriber: pt ?CM Name: [place]      Phone#: [phone] option #7     Fax#: [phone] ?Pre-Cert#: [id number] approved for 7 days      Employer:  ?Benefits:  Phone #: [phone]     Name: 11/15/21 ?Eff. Date: 09/08/2021     Deduct: none      Out of Pocket Max: $3900      Life Max: none ?CIR: $295 co pay per day days 1 until 6      SNF: no copay days 1 until 20; $196 co pay per day days 21 until 40; no co pay days 41 until 100 ?Outpatient: $20 per visit     Co-Pay: visits per medical neccesity ?Home Health:  100%      Co-Pay: visits per medical neccesity ?DME: 80%     Co-Pay: 20% ?Providers: in network ? ?SECONDARY: none       ?  ?Financial Counselor:       Phone#:  ?  ?The ?Data Collection Information Summary? for patients in Inpatient Rehabilitation Facilities with attached ?Privacy Act [place] Records? was provided and verbally reviewed with: Family ?  ?Emergency Contact Information ?Contact Information   ?  ?  Name Relation Home Work Mobile  ?  Elizabeth [name] Spouse [phone]   [phone]  ?  ?   ?  ?Current Medical History  ?Patient Admitting Diagnosis: CVA ?  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  Presented to ED on 11/08/2021 to [facility] with complaints of left facial droop and left upper extremity weakness.  ?  ?She was outside of the TPA window and Tele neurologist was consulted. CTA of head and neck with no significant interval change since prior CTA on 06/10/2021. No intracranial large vessel occlusion. Unchanged moderate to severe stenosis with the distal M1 right MCA. Progressive severe stenosis within an inferior division proximal M2 left MCA vessel. Progressive at least moderate stenosis within the P2 right PCA branch. Evidence of embolization coils from previous aneurysm treatments. On 11/11/21 MRI confirmed lacunar infarct in right internal capsule. ECHO normal . ED 55 % with grade 1 diastolic dysfunction. SLP for MBS  and revealed oropharyngeal dysphagia with moderate aspiration of thin liquids due to poor oral control and delayed swallow. Pureed level 4 diet with mildy thickened liquids, level 2. Patient placed on dual antiplatelet therapy of Asa and Plavix.  Zio patch is recommended at discharge. Cholesterol 243 and LDL of 185. Statin therapy recommended but patient reports intolerance and has bad reactions to all therefore not  started. She takes Norvasc at home and it is continued. Hgb A1c 6.4. Taking Mucinex and Zyrtec for congestion , she reports history of seasonal allergies.  ?  ?Patient's medical record from [facility] has been reviewed by the rehabilitation admission coordinator and physician. ?  ?Past Medical History  ?    ?Past Medical History:  ?  Diagnosis Date  ? Absolute anemia 07/28/2018  ? Active smoker    ? Allergy    ? Anxiety    ?  Takes Xanax for anxiety  ? Arthritis    ? Asthma    ? Cataract    ? Chronic airway obstruction, not elsewhere classified    ? Clotting disorder ([place])    ?  stoke   ? Depression    ? Diarrhea    ? Diverticulosis of colon (without mention of hemorrhage)    ? Esophageal reflux    ? Family history of malignant neoplasm of gastrointestinal tract    ? GI bleed    ? Headache(784.0)    ?  Irregular  ? Neuromuscular disorder (HCC)    ?  numbness is left hand and left elbow  ? Obesity, unspecified    ? Other and unspecified hyperlipidemia    ? Personal history of colonic polyps    ? Stricture and stenosis of esophagus    ? Stroke [facility])    ? Thyroid disease    ? Type II or unspecified type diabetes mellitus without mention of complication, not stated as uncontrolled    ? Unspecified asthma(493.90)    ? Unspecified essential hypertension    ?  ?Has the patient had major surgery during 100 days prior to admission? No ?  ?Family History   ?family history includes Asthma in her mother; Cancer in her paternal grandmother and sister; Colon cancer in an other family member; Diabetes in her mother, paternal grandmother, and sister; Drug abuse in her mother; Early death in her father; Heart attack in her sister; Heart disease in her brother, maternal grandmother, mother, paternal grandfather, and sister; Hypertension in her mother and sister; Kidney disease in her mother; Lung disease in her brother; Mental illness in her paternal grandmother and sister; Stroke in her mother; Thyroid cancer in an other  family member; Tuberculosis in her maternal grandfather. ?  ?Current Medications ?  ?Current Outpatient Medications:  ?  albuterol (VENTOLIN HFA) 108 (90 Base) MCG/ACT inhaler, INHALE 2 PUFFS EVERY SIX HOURS AS NEEDED FOR WHEEZING OR SHORTNESS OF BREATH, Disp: 8.5 g, Rfl: 2 ?  amLODipine (NORVASC) 10 MG tablet, TAKE 1 TABLET BY MOUTH EVERY DAY, Disp: 90 tablet, Rfl: 5 ?  aspirin 325 MG tablet, Take 325 mg by mouth daily., Disp: , Rfl:  ?  Carboxymethylcellulose Sodium 1 % GEL, Place 1 drop into both eyes at bedtime., Disp: , Rfl:  ?  chlorpheniramine (CHLOR-TRIMETON) 4 MG tablet, Take 2-4 mg by mouth at bedtime as needed for allergies., Disp: , Rfl:  ?  CINNAMON PO, Take 1,000 mg by mouth daily as needed (for blood sugars). , Disp: , Rfl:  ?  clopidogrel (PLAVIX) 75 MG tablet, TAKE ONE TABLET BY MOUTH DAILY, Disp: 90 tablet, Rfl: 1 ?  empagliflozin (JARDIANCE) 10 MG TABS tablet, Take 1 tablet (10 mg total) by mouth daily before breakfast., Disp: 14 tablet, Rfl: 0 ?  OVER THE COUNTER MEDICATION, Grape seed oil capsule one daily, Disp: , Rfl:  ?  OVER THE COUNTER MEDICATION, Herb for blood sugar ( unsure of name), Disp: , Rfl:  ?  pantoprazole (PROTONIX) 40 MG tablet, TAKE 1 TABLET BY MOUTH DAILY BEFORE BREAKFAST, Disp: 30 tablet, Rfl: 5 ?  sodium chloride (OCEAN) 0.65 % SOLN nasal spray, Place 1 spray into both nostrils as needed for congestion., Disp: , Rfl:  ?  ursodiol (ACTIGALL) 300 MG capsule, Take 1 capsule (300 mg  total) by mouth 3 (three) times daily., Disp: 270 capsule, Rfl: 3 ?  ?Patients Current Diet: Diet IDDSI level 4 puree and level 2 mildly thick ( nectar) liquids ?  ?Precautions / Restrictions ?Precautions: Fall (Has fallen on acute) ?Precautions/Special Needs: Other ?Precaution Comments: aspiration precautions ?Weight Bearing Restrictions: No ?  ?Has the patient had 2 or more falls or a fall with injury in the past year? No ?  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  ?Prior Functional Level ?Self Care: Did the patient need help bathing, dressing, using the toilet or eating? Independent ?  ?Indoor Mobility: Did the patient need assistance wit

## 2021-11-19 NOTE — H&P (Signed)
?  ?Physical Medicine and Rehabilitation Admission H&P ?  ?  ?No chief complaint on file. ?CC: Functional deficits secondary to lacunar infarct of right internal capsule ?  ?HPI: Elizabeth [name] is a 81 year old female who presented to [facility] on 11/08/2021 with an approximate 4 to 5-hour onset of left facial droop and left upper extremity weakness.  Telemetry neurologist was consulted and CT head without contrast and CTA performed.  No evidence of acute infarction, hemorrhage or hydrocephalus, extra-axial collection or mass lesion mass effect noted.  MRI not available.  Consequently, she was transferred to [facility], however no bed was available.  The patient remained in the emergency department at [facility] and on 3/6 MRI was performed and confirmed lacunar infarct in the right internal capsule.  No associated hemorrhage or mass effect.  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  Plavix and aspirin initiated.  Patient intolerant of all statins. Zio patch deferred until discharge.  Hemodynamically stable. The patient requires inpatient physical medicine and rehabilitation evaluations and treatment secondary to dysfunction due to lacunar infarct with left-sided weakness and dysphagia. Currently requiring mod to max assist. ?  ?The patient's history is significant for prior CVA approximately 9 years ago with cerebral embolization coils placed.  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  ?  ? he patient underwent venous duplex of the left lower extremities on 3/7 without evidence of deep venous thrombosis.  CTA of the neck revealed patent common carotid and internal carotid arteries without hemodynamically significant stenoses.  Modified barium swallow revealed oropharyngeal dysphagia with moderate aspiration of thin liquids.  She is tolerating a pur?ed level 4 diet with mildly thick  liquids, level 2.  2D echocardiogram was negative for PFO, positive for grade 1 diastolic dysfunction with overall left ventricular ejection fraction (55%). Hemoglobin A1c is 6.4%. LDL is 185. TSH is 1.735 ?  ?Review of Systems  ?Constitutional:  Negative for chills and fever.  ?HENT:  Negative for hearing loss and tinnitus.   ?Eyes:  Negative for blurred vision and double vision.  ?Respiratory:  Positive for cough. Negative for shortness of breath.   ?Cardiovascular:  Negative for chest pain and palpitations.  ?Gastrointestinal:  Negative for nausea and vomiting.  ?Genitourinary:  Negative for dysuria.  ?Musculoskeletal:  Negative for myalgias.  ?Skin:  Negative for itching.  ?Neurological:  Positive for sensory change, speech change and focal weakness.  ?Psychiatric/Behavioral:  Negative for depression and suicidal ideas.   ?    ?Past Medical History:  ?Diagnosis Date  ? Absolute anemia 07/28/2018  ? Active smoker    ? Allergy    ? Anxiety    ?  Takes Xanax for anxiety  ? Arthritis    ? Asthma    ? Cataract    ? Chronic airway obstruction, not elsewhere classified    ? Clotting disorder ([place])    ?  stoke   ? Depression    ? Diarrhea    ? Diverticulosis of colon (without mention of hemorrhage)    ? Esophageal reflux    ? Family history of malignant neoplasm of gastrointestinal tract    ? GI bleed    ? Headache(784.0)    ?  Irregular  ? Neuromuscular disorder (HCC)    ?  numbness is left hand and left elbow  ? Obesity, unspecified    ? Other and unspecified hyperlipidemia    ?  Personal history of colonic polyps    ? Stricture and stenosis of esophagus    ? Stroke [facility])    ? Thyroid disease    ? Type II or unspecified type diabetes mellitus without mention of complication, not stated as uncontrolled    ? Unspecified asthma(493.90)    ? Unspecified essential hypertension    ?  ?     ?Past Surgical History:  ?Procedure Laterality Date  ? APPENDECTOMY      ? BACK SURGERY      ?  Spinal surgery  ? BRAIN SURGERY      ?   2013 - stoke ( fall in 2010)   ? CARDIAC CATHETERIZATION   2006  ?  LAD: 30%, RCA : 20%, normal EF, elevated LVEDP  ? CARDIAC CATHETERIZATION      ? CEREBRAL ANGIOGRAM   12/20/2015  ? COLONOSCOPY N/A 08/09/2018  ?  Procedure: COLONOSCOPY;  Surgeon: Rogene Houston, MD;  [facility];  Service: Endoscopy;  Laterality: N/A;  1:55  ? ESOPHAGEAL DILATION N/A 08/09/2018  ?  Procedure: ESOPHAGEAL DILATION;  Surgeon: Rogene Houston, MD;  Location: [facility];  Service: Endoscopy;  Laterality: N/A;  ? ESOPHAGOGASTRODUODENOSCOPY N/A 08/09/2018  ?  Procedure: ESOPHAGOGASTRODUODENOSCOPY (EGD);  Surgeon: Rogene Houston, MD;  Location: [facility];  Service: Endoscopy;  Laterality: N/A;  ? GIVENS CAPSULE STUDY N/A 08/25/2018  ?  Procedure: GIVENS CAPSULE STUDY;  Surgeon: Rogene Houston, MD;  Location[facility];  Service: Endoscopy;  Laterality: N/A;  ? IR ANGIO INTRA EXTRACRAN SEL COM CAROTID INNOMINATE BILAT MOD SED   01/14/2018  ? IR ANGIO INTRA EXTRACRAN SEL COM CAROTID INNOMINATE BILAT MOD SED   05/11/2018  ? IR ANGIO VERTEBRAL SEL SUBCLAVIAN INNOMINATE UNI L MOD SED   01/14/2018  ? IR ANGIO VERTEBRAL SEL SUBCLAVIAN INNOMINATE UNI L MOD SED   05/11/2018  ? IR ANGIO VERTEBRAL SEL VERTEBRAL UNI R MOD SED   01/14/2018  ? IR ANGIO VERTEBRAL SEL VERTEBRAL UNI R MOD SED   05/11/2018  ? KNEE ARTHROSCOPY      ?  left  ? LUMBAR DISC SURGERY      ? POLYPECTOMY   08/09/2018  ?  Procedure: POLYPECTOMY;  Surgeon: Rogene Houston, MD;  Location: [facility];  Service: Endoscopy;;  ascending colon (CS x 2, HS x5) hepatic flexure (HSx1), recto-sigmoid (HSx1)  ? TONSILLECTOMY      ? TOTAL ABDOMINAL HYSTERECTOMY      ?  menopause/bleeding  ?  ?     ?Family History  ?Problem Relation Age of Onset  ? Diabetes Mother    ? Heart disease Mother    ? Asthma Mother    ? Kidney disease Mother    ? Drug abuse Mother    ? Hypertension Mother    ? Stroke Mother    ?      fall -several bleed on brain 1990  ? Early death Father    ?       trauma  ? Colon cancer Other    ?      first cousin, paternal aunt and uncle  ? Cancer Paternal Grandmother    ?      gastric  ? Diabetes Paternal Grandmother    ? Mental illness Paternal Grandmother    ? Heart disease Maternal Grandmother    ? Thyroid cancer Other    ?      aunt  ?  Heart disease Sister    ? Mental illness Sister    ? Heart attack Sister    ? Heart disease Brother    ? Lung disease Brother    ? Cancer Sister    ?      pancreatic  ? Diabetes Sister    ? Hypertension Sister    ? Tuberculosis Maternal Grandfather    ? Heart disease Paternal Grandfather    ?  ?Social History:  reports that she quit smoking about a year ago. Her smoking use included cigarettes. She started smoking about 38 years ago. She has a 20.00 pack-year smoking history. She has never used smokeless tobacco. She reports that she does not drink alcohol and does not use drugs. ?Allergies:  ?     ?Allergies  ?Allergen Reactions  ? Banana Anaphylaxis  ? Codeine Nausea And Vomiting and Other (See Comments)  ?    PROJECTILE VOMITING  ? Stevia [Stevioside] Swelling and Other (See Comments)  ?    Face, tongue, and eye swelling   ? Actos [Pioglitazone] Swelling  ? Benicar [Olmesartan Medoxomil] Other (See Comments)  ?    Feel bad  ? Chantix [Varenicline] Other (See Comments)  ?    "Worked opposite."  ? Diovan [Valsartan] Other (See Comments)  ?    Feel bad  ? Lipitor [Atorvastatin] Other (See Comments)  ?    REACTION:  Joint pain  ? Spinach Other (See Comments)  ?    Stomach problems  ? Vicodin [Hydrocodone-Acetaminophen] Nausea And Vomiting  ? Zocor [Simvastatin] Other (See Comments)  ?    REACTION:  "made liver function incorrectly"  ? Ace Inhibitors Cough  ? Celebrex [Celecoxib] Rash  ? Latex Rash and Other (See Comments)  ?    REACTION: red rash  ? Metformin And Related    ?    GI - upset   ? Penicillins Rash and Other (See Comments)  ?    Has patient had a PCN reaction causing immediate rash, facial/tongue/throat swelling, SOB or  lightheadedness with hypotension: No ?Has patient had a PCN reaction causing severe rash involving mucus membranes or skin necrosis: No ?Has patient had a PCN reaction that required hospitalization: No- MD of

## 2021-11-20 DIAGNOSIS — I6381 Other cerebral infarction due to occlusion or stenosis of small artery: Secondary | ICD-10-CM | POA: Diagnosis not present

## 2021-11-20 LAB — COMPREHENSIVE METABOLIC PANEL
ALT: 13 U/L (ref 0–44)
AST: 20 U/L (ref 15–41)
Albumin: 3 g/dL — ABNORMAL LOW (ref 3.5–5.0)
Alkaline Phosphatase: 72 U/L (ref 38–126)
Anion gap: 10 (ref 5–15)
BUN: 20 mg/dL (ref 8–23)
CO2: 24 mmol/L (ref 22–32)
Calcium: 9.6 mg/dL (ref 8.9–10.3)
Chloride: 99 mmol/L (ref 98–111)
Creatinine, Ser: 0.69 mg/dL (ref 0.44–1.00)
GFR, Estimated: >60 mL/min (ref >60)
Glucose, Bld: 111 mg/dL — ABNORMAL HIGH (ref 70–99)
Potassium: 4 mmol/L (ref 3.5–5.1)
Sodium: 133 mmol/L — ABNORMAL LOW (ref 135–145)
Total Bilirubin: 0.8 mg/dL (ref 0.3–1.2)
Total Protein: 6.1 g/dL — ABNORMAL LOW (ref 6.5–8.1)

## 2021-11-20 LAB — CBC WITH DIFFERENTIAL/PLATELET
Abs Immature Granulocytes: 0.04 10*3/uL (ref 0.00–0.07)
Basophils Absolute: 0.1 10*3/uL (ref 0.0–0.1)
Basophils Relative: 1 %
Eosinophils Absolute: 0.2 10*3/uL (ref 0.0–0.5)
Eosinophils Relative: 3 %
HCT: 36.3 % (ref 36.0–46.0)
Hemoglobin: 12.2 g/dL (ref 12.0–15.0)
Immature Granulocytes: 1 %
Lymphocytes Relative: 27 %
Lymphs Abs: 1.4 10*3/uL (ref 0.7–4.0)
MCH: 27.7 pg (ref 26.0–34.0)
MCHC: 33.6 g/dL (ref 30.0–36.0)
MCV: 82.3 fL (ref 80.0–100.0)
Monocytes Absolute: 0.4 10*3/uL (ref 0.1–1.0)
Monocytes Relative: 8 %
Neutro Abs: 3.1 10*3/uL (ref 1.7–7.7)
Neutrophils Relative %: 60 %
Platelets: 265 10*3/uL (ref 150–400)
RBC: 4.41 MIL/uL (ref 3.87–5.11)
RDW: 14.1 % (ref 11.5–15.5)
WBC: 5.2 10*3/uL (ref 4.0–10.5)
nRBC: 0 % (ref 0.0–0.2)

## 2021-11-20 LAB — GLUCOSE, CAPILLARY
Glucose-Capillary: 126 mg/dL — ABNORMAL HIGH (ref 70–99)
Glucose-Capillary: 124 mg/dL — ABNORMAL HIGH (ref 70–99)
Glucose-Capillary: 103 mg/dL — ABNORMAL HIGH (ref 70–99)
Glucose-Capillary: 129 mg/dL — ABNORMAL HIGH (ref 70–99)

## 2021-11-20 MED ORDER — ASPIRIN 81 MG PO CHEW
81.0000 mg | CHEWABLE_TABLET | Freq: Every day | ORAL | Status: DC
  Administered 2021-11-20 – 2021-12-11 (×22): 81 mg via ORAL
  Filled 2021-11-20 (×22): qty 1

## 2021-11-20 NOTE — Progress Notes (Signed)
Inpatient Rehabilitation  Patient information reviewed and entered into eRehab system by Dimitris Shanahan Baptiste Littler, OTR/L.   Information including medical coding, functional ability and quality indicators will be reviewed and updated through discharge.    

## 2021-11-20 NOTE — Plan of Care (Signed)
?Problem: RH Balance ?Goal: LTG: Patient will maintain dynamic sitting balance (OT) ?Description: LTG:  Patient will maintain dynamic sitting balance with assistance during activities of daily living (OT) ?Flowsheets (Taken 11/20/2021 1226) ?LTG: Pt will maintain dynamic sitting balance during ADLs with: Supervision/Verbal cueing ?Goal: LTG Patient will maintain dynamic standing with ADLs (OT) ?Description: LTG:  Patient will maintain dynamic standing balance with assist during activities of daily living (OT)  ?Flowsheets (Taken 11/20/2021 1226) ?LTG: Pt will maintain dynamic standing balance during ADLs with: Contact Guard/Touching assist ?  ?Problem: Sit to Stand ?Goal: LTG:  Patient will perform sit to stand in prep for activites of daily living with assistance level (OT) ?Description: LTG:  Patient will perform sit to stand in prep for activites of daily living with assistance level (OT) ?Flowsheets (Taken 11/20/2021 1226) ?LTG: PT will perform sit to stand in prep for activites of daily living with assistance level: Contact Guard/Touching assist ?  ?Problem: RH Grooming ?Goal: LTG Patient will perform grooming w/assist,cues/equip (OT) ?Description: LTG: Patient will perform grooming with assist, with/without cues using equipment (OT) ?Flowsheets (Taken 11/20/2021 1226) ?LTG: Pt will perform grooming with assistance level of: Supervision/Verbal cueing ?  ?Problem: RH Bathing ?Goal: LTG Patient will bathe all body parts with assist levels (OT) ?Description: LTG: Patient will bathe all body parts with assist levels (OT) ?Flowsheets (Taken 11/20/2021 1226) ?LTG: Pt will perform bathing with assistance level/cueing: Contact Guard/Touching assist ?  ?Problem: RH Dressing ?Goal: LTG Patient will perform upper body dressing (OT) ?Description: LTG Patient will perform upper body dressing with assist, with/without cues (OT). ?Flowsheets (Taken 11/20/2021 1226) ?LTG: Pt will perform upper body dressing with assistance level  of: Supervision/Verbal cueing ?Goal: LTG Patient will perform lower body dressing w/assist (OT) ?Description: LTG: Patient will perform lower body dressing with assist, with/without cues in positioning using equipment (OT) ?Flowsheets (Taken 11/20/2021 1226) ?LTG: Pt will perform lower body dressing with assistance level of: Minimal Assistance - Patient > 75% ?  ?Problem: RH Toileting ?Goal: LTG Patient will perform toileting task (3/3 steps) with assistance level (OT) ?Description: LTG: Patient will perform toileting task (3/3 steps) with assistance level (OT)  ?Flowsheets (Taken 11/20/2021 1226) ?LTG: Pt will perform toileting task (3/3 steps) with assistance level: Contact Guard/Touching assist ?  ?Problem: RH Vision ?Goal: RH LTG Vision (Specify) ?Flowsheets (Taken 11/20/2021 1226) ?LTG: Vision Goals: Pt will locate desired ADL items on her L during morning routine with no more than min VCs. ?  ?Problem: RH Functional Use of Upper Extremity ?Goal: LTG Patient will use RT/LT upper extremity as a (OT) ?Description: LTG: Patient will use right/left upper extremity as a stabilizer/gross assist/diminished/nondominant/dominant level with assist, with/without cues during functional activity (OT) ?Flowsheets (Taken 11/20/2021 1226) ?LTG: Use of upper extremity in functional activities: LUE as gross assist level ?LTG: Pt will use upper extremity in functional activity with assistance level of: Supervision/Verbal cueing ?  ?Problem: RH Light Housekeeping ?Goal: LTG Patient will perform light housekeeping w/assist (OT) ?Description: LTG: Patient will perform light housekeeping with assistance, with/without cues (OT). ?Flowsheets (Taken 11/20/2021 1226) ?LTG: Pt will perform light housekeeping with assistance level of: Contact Guard/Touching assist ?  ?Problem: RH Toilet Transfers ?Goal: LTG Patient will perform toilet transfers w/assist (OT) ?Description: LTG: Patient will perform toilet transfers with assist, with/without cues  using equipment (OT) ?Flowsheets (Taken 11/20/2021 1226) ?LTG: Pt will perform toilet transfers with assistance level of: Contact Guard/Touching assist ?  ?Problem: RH Tub/Shower Transfers ?Goal: LTG Patient will perform tub/shower transfers  w/assist (OT) ?Description: LTG: Patient will perform tub/shower transfers with assist, with/without cues using equipment (OT) ?Flowsheets (Taken 11/20/2021 1226) ?LTG: Pt will perform tub/shower stall transfers with assistance level of: Contact Guard/Touching assist ?  ?Problem: RH Awareness ?Goal: LTG: Patient will demonstrate awareness during functional activites type of (OT) ?Description: LTG: Patient will demonstrate awareness during functional activites type of (OT) ?Flowsheets (Taken 11/20/2021 1226) ?Patient will demonstrate awareness during functional activites type of: Emergent ?LTG: Patient will demonstrate awareness during functional activites type of (OT): Supervision ?  ?

## 2021-11-20 NOTE — Evaluation (Signed)
Physical Therapy Assessment and Plan ? ?Patient Details  ?Name: Elizabeth [name] ?MRN: [id number] ?Date of Birth: 01/20/1941 ? ?PT Diagnosis: Abnormal posture, Abnormality of gait, Coordination disorder, Hemiplegia non-dominant, Hypotonia, and Muscle weakness ?Rehab Potential: Good ?ELOS: 18-21 days  ? ?Today's Date: 11/20/2021 ?PT Individual Time: 1035-1130 ?PT Individual Time Calculation (min): 55 min   ? ?Hospital Problem: Principal Problem: ?  Right-sided lacunar stroke [facility]) ? ? ?Past Medical History:  ?Past Medical History:  ?Diagnosis Date  ? Absolute anemia 07/28/2018  ? Active smoker   ? Allergy   ? Anxiety   ? Takes Xanax for anxiety  ? Arthritis   ? Asthma   ? Cataract   ? Chronic airway obstruction, not elsewhere classified   ? Clotting disorder ([place])   ? stoke   ? Depression   ? Diarrhea   ? Diverticulosis of colon (without mention of hemorrhage)   ? Esophageal reflux   ? Family history of malignant neoplasm of gastrointestinal tract   ? GI bleed   ? Headache(784.0)   ? Irregular  ? Neuromuscular disorder (HCC)   ? numbness is left hand and left elbow  ? Obesity, unspecified   ? Other and unspecified hyperlipidemia   ? Personal history of colonic polyps   ? Stricture and stenosis of esophagus   ? Stroke [facility])   ? Thyroid disease   ? Type II or unspecified type diabetes mellitus without mention of complication, not stated as uncontrolled   ? Unspecified asthma(493.90)   ? Unspecified essential hypertension   ? ?Past Surgical History:  ?Past Surgical History:  ?Procedure Laterality Date  ? APPENDECTOMY    ? BACK SURGERY    ? Spinal surgery  ? BRAIN SURGERY    ? 2013 - stoke ( fall in 2010)   ? CARDIAC CATHETERIZATION  2006  ? LAD: 30%, RCA : 20%, normal EF, elevated LVEDP  ? CARDIAC CATHETERIZATION    ? CEREBRAL ANGIOGRAM  12/20/2015  ? COLONOSCOPY N/A 08/09/2018  ? Procedure: COLONOSCOPY;  Surgeon: Rogene Houston, MD;  [facility];  Service: Endoscopy;  Laterality: N/A;  1:55  ? ESOPHAGEAL DILATION  N/A 08/09/2018  ? Procedure: ESOPHAGEAL DILATION;  Surgeon: Rogene Houston, MD;  [facility];  Service: Endoscopy;  Laterality: N/A;  ? ESOPHAGOGASTRODUODENOSCOPY N/A 08/09/2018  ? Procedure: ESOPHAGOGASTRODUODENOSCOPY (EGD);  Surgeon: Rogene Houston, MD;  [facility];  Service: Endoscopy;  Laterality: N/A;  ? GIVENS CAPSULE STUDY N/A 08/25/2018  ? Procedure: GIVENS CAPSULE STUDY;  Surgeon: Rogene Houston, MD;  [facility];  Service: Endoscopy;  Laterality: N/A;  ? IR ANGIO INTRA EXTRACRAN SEL COM CAROTID INNOMINATE BILAT MOD SED  01/14/2018  ? IR ANGIO INTRA EXTRACRAN SEL COM CAROTID INNOMINATE BILAT MOD SED  05/11/2018  ? IR ANGIO VERTEBRAL SEL SUBCLAVIAN INNOMINATE UNI L MOD SED  01/14/2018  ? IR ANGIO VERTEBRAL SEL SUBCLAVIAN INNOMINATE UNI L MOD SED  05/11/2018  ? IR ANGIO VERTEBRAL SEL VERTEBRAL UNI R MOD SED  01/14/2018  ? IR ANGIO VERTEBRAL SEL VERTEBRAL UNI R MOD SED  05/11/2018  ? KNEE ARTHROSCOPY    ? left  ? LUMBAR DISC SURGERY    ? POLYPECTOMY  08/09/2018  ? Procedure: POLYPECTOMY;  Surgeon: Rogene Houston, MD;  [facility];  Service: Endoscopy;;  ascending colon (CS x 2, HS x5) hepatic flexure (HSx1), recto-sigmoid (HSx1)  ? TONSILLECTOMY    ? TOTAL ABDOMINAL HYSTERECTOMY    ?  menopause/bleeding  ? ? ?Assessment & Plan ?Clinical Impression: Patient is a 81 year old female who presented to [name] on 11/08/2021 with an approximate 4 to 5-hour onset of left facial droop and left upper extremity weakness.  Telemetry neurologist was consulted and CT head without contrast and CTA performed.  No evidence of acute infarction, hemorrhage or hydrocephalus, extra-axial collection or mass lesion mass effect noted.  MRI not available.  Consequently, she was transferred to [name], however no bed was available.  The patient remained in the emergency department at [name] and on 3/6 MRI was performed and confirmed lacunar  infarct in the right internal capsule.  No associated hemorrhage or mass effect.  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  Plavix and aspirin initiated.  Patient intolerant of all statins. Zio patch deferred until discharge.  Hemodynamically stable. The patient requires inpatient physical medicine and rehabilitation evaluations and treatment secondary to dysfunction due to lacunar infarct with left-sided weakness and dysphagia. Currently requiring mod to max assist. Patient transferred to CIR on 11/19/2021 .  ? ?Patient currently requires max with mobility secondary to muscle weakness, muscle joint tightness, and muscle paralysis, decreased cardiorespiratoy endurance, abnormal tone, unbalanced muscle activation, and decreased coordination, field cut, decreased attention to left, decreased attention, decreased awareness, decreased problem solving, decreased safety awareness, decreased memory, and delayed processing, and decreased sitting balance, decreased standing balance, decreased postural control, hemiplegia, and decreased balance strategies.  Prior to hospitalization, patient was independent  with mobility and lived with Spouse in a House home.  Home access is 3Stairs to enter. ? ?Patient will benefit from skilled PT intervention to maximize safe functional mobility, minimize fall risk, and decrease caregiver burden for planned discharge home with 24 hour assist.  Anticipate patient will benefit from follow up [facility] at discharge. ? ?PT - End of Session ?Activity Tolerance: Tolerates 10 - 20 min activity with multiple rests ?Endurance Deficit: Yes ?PT Assessment ?Rehab Potential (ACUTE/IP ONLY): Good ?PT Barriers to Discharge: Inaccessible home environment;Home environment access/layout;Incontinence;Behavior ?PT Patient demonstrates impairments in the following area(s): Balance;Behavior;Edema;Motor;Perception;Safety;Sensory ?PT Transfers Functional Problem(s): Bed  Mobility;Bed to Chair;Car;Furniture ?PT Locomotion Functional Problem(s): Wheelchair Mobility;Stairs;Ambulation ?PT Plan ?PT Intensity: Minimum of 1-2 x/day ,45 to 90 minutes ?PT Frequency: 5 out of 7 days ?PT Duration Estimated Length of Stay: 18-21 days ?PT Treatment/Interventions: Ambulation/gait training;Discharge planning;Functional mobility training;Psychosocial support;Therapeutic Activities;Visual/perceptual remediation/compensation;Balance/vestibular training;Skin care/wound management;Disease management/prevention;Neuromuscular re-education;Therapeutic Exercise;Wheelchair propulsion/positioning;Cognitive remediation/compensation;DME/adaptive equipment instruction;Pain management;Splinting/orthotics;UE/LE Strength taining/ROM;Community reintegration;Functional electrical stimulation;Patient/family education;Stair training;UE/LE Coordination activities ?PT Transfers Anticipated Outcome(s): CGA with LRAD ?PT Locomotion Anticipated Outcome(s): ambulatory CGA with LRAD ?PT Recommendation ?Recommendations for Other Services: Therapeutic Recreation consult ?Therapeutic Recreation Interventions: Stress management;Outing/community reintergration ?Follow Up Recommendations: Home health PT ?Patient destination: Home ?Equipment Recommended: Rolling walker with 5" wheels;Wheelchair (measurements);Wheelchair cushion (measurements) ? ? ?PT Evaluation ?Precautions/Restrictions ?Precautions ?Precautions: Fall ?Precaution Comments: L hemiparesis ?Restrictions ?Weight Bearing Restrictions: No ?General ?Chart Reviewed: Yes ?Family/Caregiver Present: No Vital Signs  ?Pain ?Pain Assessment ?Pain Scale: 0-10 ?Pain Score: 0-No pain ?Pain Interference ?Pain Interference ?Pain Effect on Sleep: 1. Rarely or not at all ?Pain Interference with Therapy Activities: 1. Rarely or not at all ?Pain Interference with Day-to-Day Activities: 4. Almost constantly ?Home Living/Prior Functioning ?Home Living ?Living Arrangements:  Spouse/significant other ?Available Help at Discharge: Family;Available 24 hours/day ?Type of Home: House ?Home Access: Stairs to enter ?Entrance Stairs-Number of Steps: 3 ?Entrance Stairs-Rails: Left ?Home Layout: O

## 2021-11-20 NOTE — Plan of Care (Signed)
?  Problem: RH Balance ?Goal: LTG Patient will maintain dynamic sitting balance (PT) ?Description: LTG:  Patient will maintain dynamic sitting balance with assistance during mobility activities (PT) ?Flowsheets (Taken 11/20/2021 1159) ?LTG: Pt will maintain dynamic sitting balance during mobility activities with:: Supervision/Verbal cueing ?Goal: LTG Patient will maintain dynamic standing balance (PT) ?Description: LTG:  Patient will maintain dynamic standing balance with assistance during mobility activities (PT) ?Flowsheets (Taken 11/20/2021 1159) ?LTG: Pt will maintain dynamic standing balance during mobility activities with:: Contact Guard/Touching assist ?  ?Problem: Sit to Stand ?Goal: LTG:  Patient will perform sit to stand with assistance level (PT) ?Description: LTG:  Patient will perform sit to stand with assistance level (PT) ?Flowsheets (Taken 11/20/2021 1159) ?LTG: PT will perform sit to stand in preparation for functional mobility with assistance level: Contact Guard/Touching assist ?  ?Problem: RH Bed Mobility ?Goal: LTG Patient will perform bed mobility with assist (PT) ?Description: LTG: Patient will perform bed mobility with assistance, with/without cues (PT). ?Flowsheets (Taken 11/20/2021 1159) ?LTG: Pt will perform bed mobility with assistance level of: Contact Guard/Touching assist ?  ?Problem: RH Bed to Chair Transfers ?Goal: LTG Patient will perform bed/chair transfers w/assist (PT) ?Description: LTG: Patient will perform bed to chair transfers with assistance (PT). ?Flowsheets (Taken 11/20/2021 1159) ?LTG: Pt will perform Bed to Chair Transfers with assistance level: Contact Guard/Touching assist ?  ?Problem: RH Car Transfers ?Goal: LTG Patient will perform car transfers with assist (PT) ?Description: LTG: Patient will perform car transfers with assistance (PT). ?Flowsheets (Taken 11/20/2021 1159) ?LTG: Pt will perform car transfers with assist:: Contact Guard/Touching assist ?  ?Problem: RH  Furniture Transfers ?Goal: LTG Patient will perform furniture transfers w/assist (OT/PT) ?Description: LTG: Patient will perform furniture transfers  with assistance (OT/PT). ?Flowsheets (Taken 11/20/2021 1159) ?LTG: Pt will perform furniture transfers with assist:: Contact Guard/Touching assist ?  ?Problem: RH Ambulation ?Goal: LTG Patient will ambulate in controlled environment (PT) ?Description: LTG: Patient will ambulate in a controlled environment, # of feet with assistance (PT). ?Flowsheets (Taken 11/20/2021 1159) ?LTG: Pt will ambulate in controlled environ  assist needed:: Contact Guard/Touching assist ?LTG: Ambulation distance in controlled environment: 12f with LRAD ?Goal: LTG Patient will ambulate in home environment (PT) ?Description: LTG: Patient will ambulate in home environment, # of feet with assistance (PT). ?Flowsheets (Taken 11/20/2021 1159) ?LTG: Pt will ambulate in home environ  assist needed:: Contact Guard/Touching assist ?LTG: Ambulation distance in home environment: 58fwith LRAD ?  ?Problem: RH Wheelchair Mobility ?Goal: LTG Patient will propel w/c in controlled environment (PT) ?Description: LTG: Patient will propel wheelchair in controlled environment, # of feet with assist (PT) ?Flowsheets (Taken 11/20/2021 1159) ?LTG: Pt will propel w/c in controlled environ  assist needed:: Contact Guard/Touching assist ?  ?Problem: RH Stairs ?Goal: LTG Patient will ambulate up and down stairs w/assist (PT) ?Description: LTG: Patient will ambulate up and down # of stairs with assistance (PT) ?Flowsheets (Taken 11/20/2021 1159) ?LTG: Pt will ambulate up/down stairs assist needed:: Minimal Assistance - Patient > 75% ?LTG: Pt will  ambulate up and down number of stairs: 3 steps with 1 rail. ?  ?

## 2021-11-20 NOTE — Evaluation (Addendum)
Occupational Therapy Assessment and Plan ? ?Patient Details  ?Name: Elizabeth [name] ?MRN: [id number] ?Date of Birth: 05-Jun-1941 ? ?OT Diagnosis: abnormal posture, cognitive deficits, disturbance of vision, hemiplegia affecting non-dominant side, muscle weakness (generalized), and decreased activity tolerance/postural control ?Rehab Potential: Rehab Potential (ACUTE ONLY): Good ?ELOS: 18-21 days  ? ?Today's Date: 11/20/2021 ?OT Individual Time: [phone] ?OT Individual Time Calculation (min): 56 min   + 23 min ? ?Hospital Problem: Principal Problem: ?  Right-sided lacunar stroke [facility]) ? ? ?Past Medical History:  ?Past Medical History:  ?Diagnosis Date  ? Absolute anemia 07/28/2018  ? Active smoker   ? Allergy   ? Anxiety   ? Takes Xanax for anxiety  ? Arthritis   ? Asthma   ? Cataract   ? Chronic airway obstruction, not elsewhere classified   ? Clotting disorder ([place])   ? stoke   ? Depression   ? Diarrhea   ? Diverticulosis of colon (without mention of hemorrhage)   ? Esophageal reflux   ? Family history of malignant neoplasm of gastrointestinal tract   ? GI bleed   ? Headache(784.0)   ? Irregular  ? Neuromuscular disorder (HCC)   ? numbness is left hand and left elbow  ? Obesity, unspecified   ? Other and unspecified hyperlipidemia   ? Personal history of colonic polyps   ? Stricture and stenosis of esophagus   ? Stroke [facility])   ? Thyroid disease   ? Type II or unspecified type diabetes mellitus without mention of complication, not stated as uncontrolled   ? Unspecified asthma(493.90)   ? Unspecified essential hypertension   ? ?Past Surgical History:  ?Past Surgical History:  ?Procedure Laterality Date  ? APPENDECTOMY    ? BACK SURGERY    ? Spinal surgery  ? BRAIN SURGERY    ? 2013 - stoke ( fall in 2010)   ? CARDIAC CATHETERIZATION  2006  ? LAD: 30%, RCA : 20%, normal EF, elevated LVEDP  ? CARDIAC CATHETERIZATION    ? CEREBRAL ANGIOGRAM  12/20/2015  ? COLONOSCOPY N/A 08/09/2018  ? Procedure: COLONOSCOPY;  Surgeon: Rogene Houston, MD;  [facility];  Service: Endoscopy;  Laterality: N/A;  1:55  ? ESOPHAGEAL DILATION N/A 08/09/2018  ? Procedure: ESOPHAGEAL DILATION;  Surgeon: Rogene Houston, MD;  [facility];  Service: Endoscopy;  Laterality: N/A;  ? ESOPHAGOGASTRODUODENOSCOPY N/A 08/09/2018  ? Procedure: ESOPHAGOGASTRODUODENOSCOPY (EGD);  Surgeon: Rogene Houston, MD;  [facility];  Service: Endoscopy;  Laterality: N/A;  ? GIVENS CAPSULE STUDY N/A 08/25/2018  ? Procedure: GIVENS CAPSULE STUDY;  Surgeon: Rogene Houston, MD;  [facility];  Service: Endoscopy;  Laterality: N/A;  ? IR ANGIO INTRA EXTRACRAN SEL COM CAROTID INNOMINATE BILAT MOD SED  01/14/2018  ? IR ANGIO INTRA EXTRACRAN SEL COM CAROTID INNOMINATE BILAT MOD SED  05/11/2018  ? IR ANGIO VERTEBRAL SEL SUBCLAVIAN INNOMINATE UNI L MOD SED  01/14/2018  ? IR ANGIO VERTEBRAL SEL SUBCLAVIAN INNOMINATE UNI L MOD SED  05/11/2018  ? IR ANGIO VERTEBRAL SEL VERTEBRAL UNI R MOD SED  01/14/2018  ? IR ANGIO VERTEBRAL SEL VERTEBRAL UNI R MOD SED  05/11/2018  ? KNEE ARTHROSCOPY    ? left  ? LUMBAR DISC SURGERY    ? POLYPECTOMY  08/09/2018  ? Procedure: POLYPECTOMY;  Surgeon: Rogene Houston, MD;  [facility];  Service: Endoscopy;;  ascending colon (CS x 2, HS x5) hepatic flexure (HSx1), recto-sigmoid (  HSx1)  ? TONSILLECTOMY    ? TOTAL ABDOMINAL HYSTERECTOMY    ? menopause/bleeding  ? ? ?Assessment & Plan ?Clinical Impression: Patient is a 81 y.o. year old female who presented to [facility] on 11/08/2021 with an approximate 4 to 5-hour onset of left facial droop and left upper extremity weakness.  Telemetry neurologist was consulted and CT head without contrast and CTA performed.  No evidence of acute infarction, hemorrhage or hydrocephalus, extra-axial collection or mass lesion mass effect noted.  MRI not available.  Consequently, she was transferred to [facility], however no bed was available.  The  patient remained in the emergency department at [facility] and on 3/6 MRI was performed and confirmed lacunar infarct in the right internal capsule.  No associated hemorrhage or mass effect.  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  Plavix and aspirin initiated.  Patient intolerant of all statins. Zio patch deferred until discharge.  Hemodynamically stable. The patient requires inpatient physical medicine and rehabilitation evaluations and treatment secondary to dysfunction due to lacunar infarct with left-sided weakness and dysphagia. Currently requiring mod to max assist. ?  ?The patient's history is significant for prior CVA approximately 9 years ago with cerebral embolization coils placed.  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  ?  ? he patient underwent venous duplex of the left lower extremities on 3/7 without evidence of deep venous thrombosis.  CTA of the neck revealed patent common carotid and internal carotid arteries without hemodynamically significant stenoses.  Modified barium swallow revealed oropharyngeal dysphagia with moderate aspiration of thin liquids.  She is tolerating a pur?ed level 4 diet with mildly thick liquids, level 2.  2D echocardiogram was negative for PFO, positive for grade 1 diastolic dysfunction with overall left ventricular ejection fraction (55%). Hemoglobin A1c is 6.4%. LDL is 185. TSH is 1.735 ? Marland Kitchen  Patient transferred to CIR on 11/19/2021 .   ? ?Patient currently requires  mod to max A  with basic self-care skills secondary to muscle weakness, decreased cardiorespiratoy endurance, impaired timing and sequencing, abnormal tone, unbalanced muscle activation, decreased coordination, and decreased motor planning, decreased visual motor skills, decreased attention to left and decreased motor planning, decreased attention, decreased awareness, decreased problem solving, decreased safety  awareness, and decreased memory, and decreased sitting balance, decreased standing balance, decreased postural control, hemiplegia, and decreased balance strategies.  Prior to hospitalization, patient could complete BADL/IADL/mobility with modified independent .to min A ? ?Patient will benefit from skilled intervention to decrease level of assist with basic self-care skills, increase independence with basic self-care skills, and increase level of independence with iADL prior to discharge home independently.  Anticipate patient will require 24 hour supervision and follow up outpatient. ? ?OT - End of Session ?Activity Tolerance: Tolerates 30+ min activity with multiple rests ?Endurance Deficit: Yes ?OT Assessment ?Rehab Potential (ACUTE ONLY): Good ?OT Barriers to Discharge: Inaccessible home environment;Home environment access/layout;Decreased caregiver support;Insurance for SNF coverage ?OT Patient demonstrates impairments in the following area(s): Balance;Sensory;Cognition;Skin Integrity;Vision;Endurance;Motor;Perception;Safety ?OT Basic ADL's Functional Problem(s): Eating;Grooming;Bathing;Toileting;Dressing ?OT Advanced ADL's Functional Problem(s): Light Housekeeping ?OT Transfers Functional Problem(s): Toilet;Tub/Shower ?OT Additional Impairment(s): Fuctional Use of Upper Extremity ?OT Plan ?OT Intensity: Minimum of 1-2 x/day, 45 to 90 minutes ?OT Frequency: 5 out of 7 days ?OT Duration/Estimated Length of Stay: 18-21 days ?OT Treatment/Interventions: Balance/vestibular training;Disease mangement/prevention;Neuromuscular re-education;Self Care/advanced ADL retraining;Therapeutic Exercise;Wheelchair propulsion/positioning;UE/LE Strength taining/ROM;Skin care/wound managment;Pain management;DME/adaptive  equipment instruction;Cognitive remediation/compensation;Community reintegration;Functional electrical stimulation;Patient/family education;Splinting/orthotics;UE/LE Coordination activities;Visual/perceptual  remediation/compensation;Therapeutic Activities;Psychosocial support;Functional mobility training;Discharge planning ?OT Self Feeding Anticipated Outcome(s): S ?OT Basic Self-Care Anticipated Outcome(s): CGA

## 2021-11-20 NOTE — Progress Notes (Signed)
Inpatient Rehabilitation Center ?Individual Statement of Services ? ?Patient Name:  Elizabeth [name]  ?Date:  11/20/2021 ? ?Welcome to the [facility].  Our goal is to provide you with an individualized program based on your diagnosis and situation, designed to meet your specific needs.  With this comprehensive rehabilitation program, you will be expected to participate in at least 3 hours of rehabilitation therapies Monday-Friday, with modified therapy programming on the weekends. ? ?Your rehabilitation program will include the following services:  Physical Therapy (PT), Occupational Therapy (OT), Speech Therapy (ST), 24 hour per day rehabilitation nursing, Therapeutic Recreaction (TR), Neuropsychology, Care Coordinator, Rehabilitation Medicine, Nutrition Services, Pharmacy Services, and Other ? ?Weekly team conferences will be held on  Wednesdays to discuss your progress.  Your Inpatient Rehabilitation Care Coordinator will talk with you frequently to get your input and to update you on team discussions.  Team conferences with you and your family in attendance may also be held. ? ?Expected length of stay: 14-20 Days  Overall anticipated outcome:  Min A ? ?Depending on your progress and recovery, your program may change. Your Inpatient Rehabilitation Care Coordinator will coordinate services and will keep you informed of any changes. Your Inpatient Rehabilitation Care Coordinator's name and contact numbers are listed  below. ? ?The following services may also be recommended but are not provided by the [facility]:  ? ?Home Health Rehabiltiation Services ?Outpatient Rehabilitation Services ? ?  ?Arrangements will be made to provide these services after discharge if needed.  Arrangements include referral to agencies that provide these services. ? ?Your insurance has been verified to be:  [facility] MEDICARE ?Your primary doctor is:  Renee Rival, FNP ? ?Pertinent information will be  shared with your doctor and your insurance company. ? ?Inpatient Rehabilitation Care Coordinator:  Erlene Quan, [place] or [phone] ? ?Information discussed with and copy given to patient by: Dyanne Iha, 11/20/2021, 9:46 AM    ?

## 2021-11-20 NOTE — Patient Care Conference (Signed)
Inpatient RehabilitationTeam Conference and Plan of Care Update ?Date: 11/20/2021   Time: 10:10 AM  ? ? ?Patient Name: Elizabeth [name]      ?Medical Record Number: [id number]  ?Date of Birth: Dec 01, 1940 ?Sex: Female         ?Room/Bed: 0S92Z/3A07M-22 ?Payor Info: Payor: Marine scientist / Plan: [facility] MEDICARE / Product Type: *No Product type* /   ? ?Admit Date/Time:  11/19/2021 12:34 PM ? ?Primary Diagnosis:  Right-sided lacunar stroke ([place]) ? ?Hospital Problems: Principal Problem: ?  Right-sided lacunar stroke [facility]) ? ? ? ?Expected Discharge Date: Expected Discharge Date:  (evals pending) ? ?Team Members Present: ?Physician leading conference: Dr. Alysia Penna ?Social Worker Present: Erlene Quan, BSW ?Nurse Present: Dorien Chihuahua, RN ?PT Present: Barrie Folk, PT ?OT Present: Providence Lanius, OT ?SLP Present: Sherren Kerns, SLP ?PPS Coordinator present : Gunnar Fusi, SLP ? ?   Current Status/Progress Goal Weekly Team Focus  ?Bowel/Bladder ? ? Pt is incont. of bowell and bladder         ?Swallow/Nutrition/ Hydration ? ? Pt is D1 diet honey thick liquids         ?ADL's ? ? eval pending  eval pending  eval pending   ?Mobility ? ? Eval pending  Eval Pending.  Eval pending   ?Communication ? ?           ?Safety/Cognition/ Behavioral Observations ?           ?Pain ? ? denies pain         ?Skin ? ? no skin issues         ? ? ?Discharge Planning:  ?assesment pending   ?Team Discussion: ?Patient with Right ICA infarct with left hemiparesis, balance issues, dysarthria and hx of COPD.  ?Patient on target to meet rehab goals: ?yes, currently needs max assist for stand pivot transfers. Requires max assist for upper body care and total assist for lower body care. Has trouble incorporating left UE into tasks and decreased awareness of deficits. Goals for discharge set for CGA and supervision for cognition. ? ?*See Care Plan and progress notes for long and short-term goals.  ? ?Revisions to Treatment Plan:   ?Advanced to D1- Nectar thick liquid diet ?  ?Teaching Needs: ?Safety, balance issues, medications, secondary risk management, transfers, toileting, etc  ?Current Barriers to Discharge: ?Decreased caregiver support and Home enviroment access/layout ? ?Possible Resolutions to Barriers: ?Family education ?  ? ? Medical Summary ?Current Status: BPs labile, + cough difficulty managing saliva, on thickened liquids ? Barriers to Discharge: Medical stability ?  ?Possible Resolutions to [employer] Focus: May need repeat MBS, complete SLP swallow eval, monitor resp status in therapy ? ? ?Continued Need for Acute Rehabilitation Level of Care: The patient requires daily medical management by a physician with specialized training in physical medicine and rehabilitation for the following reasons: ?Direction of a multidisciplinary physical rehabilitation program to maximize functional independence : Yes ?Medical management of patient stability for increased activity during participation in an intensive rehabilitation regime.: Yes ?Analysis of laboratory values and/or radiology reports with any subsequent need for medication adjustment and/or medical intervention. : Yes ? ? ?I attest that I was present, lead the team conference, and concur with the assessment and plan of the team. ? ? ?Dorien Chihuahua B ?11/20/2021, 4:11 PM  ? ? ? ? ? ? ?

## 2021-11-20 NOTE — Progress Notes (Signed)
Inpatient Rehabilitation Care Coordinator ?Assessment and Plan ?Patient Details  ?Name: Elizabeth [name] ?MRN: [id number] ?Date of Birth: December 11, 1940 ? ?Today's Date: 11/20/2021 ? ?Hospital Problems: Principal Problem: ?  Right-sided lacunar stroke [facility]) ? ?Past Medical History:  ?Past Medical History:  ?Diagnosis Date  ? Absolute anemia 07/28/2018  ? Active smoker   ? Allergy   ? Anxiety   ? Takes Xanax for anxiety  ? Arthritis   ? Asthma   ? Cataract   ? Chronic airway obstruction, not elsewhere classified   ? Clotting disorder ([place])   ? stoke   ? Depression   ? Diarrhea   ? Diverticulosis of colon (without mention of hemorrhage)   ? Esophageal reflux   ? Family history of malignant neoplasm of gastrointestinal tract   ? GI bleed   ? Headache(784.0)   ? Irregular  ? Neuromuscular disorder (HCC)   ? numbness is left hand and left elbow  ? Obesity, unspecified   ? Other and unspecified hyperlipidemia   ? Personal history of colonic polyps   ? Stricture and stenosis of esophagus   ? Stroke [facility])   ? Thyroid disease   ? Type II or unspecified type diabetes mellitus without mention of complication, not stated as uncontrolled   ? Unspecified asthma(493.90)   ? Unspecified essential hypertension   ? ?Past Surgical History:  ?Past Surgical History:  ?Procedure Laterality Date  ? APPENDECTOMY    ? BACK SURGERY    ? Spinal surgery  ? BRAIN SURGERY    ? 2013 - stoke ( fall in 2010)   ? CARDIAC CATHETERIZATION  2006  ? LAD: 30%, RCA : 20%, normal EF, elevated LVEDP  ? CARDIAC CATHETERIZATION    ? CEREBRAL ANGIOGRAM  12/20/2015  ? COLONOSCOPY N/A 08/09/2018  ? Procedure: COLONOSCOPY;  Surgeon: Rogene Houston, MD;  [facility];  Service: Endoscopy;  Laterality: N/A;  1:55  ? ESOPHAGEAL DILATION N/A 08/09/2018  ? Procedure: ESOPHAGEAL DILATION;  Surgeon: Rogene Houston, MD;  [facility];  Service: Endoscopy;  Laterality: N/A;  ? ESOPHAGOGASTRODUODENOSCOPY N/A 08/09/2018  ? Procedure: ESOPHAGOGASTRODUODENOSCOPY  (EGD);  Surgeon: Rogene Houston, MD;  [facility];  Service: Endoscopy;  Laterality: N/A;  ? GIVENS CAPSULE STUDY N/A 08/25/2018  ? Procedure: GIVENS CAPSULE STUDY;  Surgeon: Rogene Houston, MD;  [facility];  Service: Endoscopy;  Laterality: N/A;  ? IR ANGIO INTRA EXTRACRAN SEL COM CAROTID INNOMINATE BILAT MOD SED  01/14/2018  ? IR ANGIO INTRA EXTRACRAN SEL COM CAROTID INNOMINATE BILAT MOD SED  05/11/2018  ? IR ANGIO VERTEBRAL SEL SUBCLAVIAN INNOMINATE UNI L MOD SED  01/14/2018  ? IR ANGIO VERTEBRAL SEL SUBCLAVIAN INNOMINATE UNI L MOD SED  05/11/2018  ? IR ANGIO VERTEBRAL SEL VERTEBRAL UNI R MOD SED  01/14/2018  ? IR ANGIO VERTEBRAL SEL VERTEBRAL UNI R MOD SED  05/11/2018  ? KNEE ARTHROSCOPY    ? left  ? LUMBAR DISC SURGERY    ? POLYPECTOMY  08/09/2018  ? Procedure: POLYPECTOMY;  Surgeon: Rogene Houston, MD;  [facility];  Service: Endoscopy;;  ascending colon (CS x 2, HS x5) hepatic flexure (HSx1), recto-sigmoid (HSx1)  ? TONSILLECTOMY    ? TOTAL ABDOMINAL HYSTERECTOMY    ? menopause/bleeding  ? ?Social History:  reports that she quit smoking about a year ago. Her smoking use included cigarettes. She started smoking about 38 years ago. She has a 20.00 pack-year smoking history.  She has never used smokeless tobacco. She reports that she does not drink alcohol and does not use drugs. ? ?Family / Support Systems ?Spouse/Significant Other: [name] ?Anticipated Caregiver: spouse, daughter and granddaughters ?Ability/Limitations of Caregiver: none ?Caregiver Availability: 24/7 ?Family Dynamics: Spouse, daughter and 2 granddaughter (lives nxt door) ? ?Social History ?Preferred language: English ?Religion: None ?Health Literacy - How often do you need to have someone help you when you read instructions, pamphlets, or other written material from your doctor or pharmacy?: Never ?Writes: Yes ?Employment Status: Retired  ? ?Abuse/Neglect ?Abuse/Neglect Assessment Can Be Completed: Yes ?Physical  Abuse: Denies ?Verbal Abuse: Denies ?Sexual Abuse: Denies ?Exploitation of patient/patient's resources: Denies ?Self-Neglect: Denies ? ?Patient response to: ?Social Isolation - How often do you feel lonely or isolated from those around you?: Never ? ?Emotional Status ?Recent Psychosocial Issues: anxiety, depression ?Psychiatric History: anxiety and depression ?Substance Abuse History: n/a ? ?Patient / Family Perceptions, Expectations & Goals ?Pt/Family understanding of illness & functional limitations: yes ?Premorbid pt/family roles/activities: independent overall ?Anticipated changes in roles/activities/participation: family able to assist with roles and tasks if needed ?Pt/family expectations/goals: Min A ? ?Community Resources ?Community Agencies: None ?Premorbid Home Care/DME Agencies: None ?Transportation available at discharge: family able to transport ?Is the patient able to respond to transportation needs?: Yes ?In the past 12 months, has lack of transportation kept you from meetings, work, or from getting things needed for daily living?: No ?Resource referrals recommended: Neuropsychology ? ?Discharge Planning ?Living Arrangements: Spouse/significant other ?Support Systems: Spouse/significant other, Children ?Type of Residence: Private residence ?Insurance Resources: Multimedia programmer (specify) ?Financial Resources: [employer], Social Security ?Financial Screen Referred: No ?Living Expenses: Own ?Money Management: Spouse, Patient ?Does the patient have any problems obtaining your medications?: No ?Home Management: independent ?Patient/Family Preliminary Plans: spouse or daughter able to assist ?Care Coordinator Barriers to Discharge: Insurance for SNF coverage ?Expected length of stay: 14-20 Days ? ?Clinical Impression ?SW met with patient and spouse, introduced self and explained role. Patient plans to d/c home with spouse to assist. Patient concerned and would like someone to take her dietary  preferences before ordering food. Patient reports she does not like pork, apple juice or apple sauce. Patient prefers eggs, milk fruit, yogurt, etc. SW will discuss with SLP and RN. No additional questions or concerns. ? ?Dyanne Iha ?11/20/2021, 1:57 PM ? ?  ?

## 2021-11-20 NOTE — Evaluation (Signed)
Speech Language Pathology Assessment and Plan ? ?Patient Details  ?Name: Elizabeth [name] ?MRN: [id number] ?Date of Birth: Oct 15, 1940 ? ?SLP Diagnosis: Dysarthria;Cognitive Impairments;Dysphagia  ?Rehab Potential: Good ?ELOS: 2.5-3 weeks  ? ?Today's Date: 11/20/2021 ?SLP Individual Time: 3875-6433 ?SLP Individual Time Calculation (min): 55 min ? ?Hospital Problem: Principal Problem: ?  Right-sided lacunar stroke [facility]) ? ?Past Medical History:  ?Past Medical History:  ?Diagnosis Date  ? Absolute anemia 07/28/2018  ? Active smoker   ? Allergy   ? Anxiety   ? Takes Xanax for anxiety  ? Arthritis   ? Asthma   ? Cataract   ? Chronic airway obstruction, not elsewhere classified   ? Clotting disorder ([place])   ? stoke   ? Depression   ? Diarrhea   ? Diverticulosis of colon (without mention of hemorrhage)   ? Esophageal reflux   ? Family history of malignant neoplasm of gastrointestinal tract   ? GI bleed   ? Headache(784.0)   ? Irregular  ? Neuromuscular disorder (HCC)   ? numbness is left hand and left elbow  ? Obesity, unspecified   ? Other and unspecified hyperlipidemia   ? Personal history of colonic polyps   ? Stricture and stenosis of esophagus   ? Stroke [facility])   ? Thyroid disease   ? Type II or unspecified type diabetes mellitus without mention of complication, not stated as uncontrolled   ? Unspecified asthma(493.90)   ? Unspecified essential hypertension   ? ?Past Surgical History:  ?Past Surgical History:  ?Procedure Laterality Date  ? APPENDECTOMY    ? BACK SURGERY    ? Spinal surgery  ? BRAIN SURGERY    ? 2013 - [place] ( fall in 2010)   ? CARDIAC CATHETERIZATION  2006  ? LAD: 30%, RCA : 20%, normal EF, elevated LVEDP  ? CARDIAC CATHETERIZATION    ? CEREBRAL ANGIOGRAM  12/20/2015  ? COLONOSCOPY N/A 08/09/2018  ? Procedure: COLONOSCOPY;  Surgeon: Rogene Houston, MD;  [facility];  Service: Endoscopy;  Laterality: N/A;  1:55  ? ESOPHAGEAL DILATION N/A 08/09/2018  ? Procedure: ESOPHAGEAL DILATION;  Surgeon:  Rogene Houston, MD;  [facility];  Service: Endoscopy;  Laterality: N/A;  ? ESOPHAGOGASTRODUODENOSCOPY N/A 08/09/2018  ? Procedure: ESOPHAGOGASTRODUODENOSCOPY (EGD);  Surgeon: Rogene Houston, MD;  [facility];  Service: Endoscopy;  Laterality: N/A;  ? GIVENS CAPSULE STUDY N/A 08/25/2018  ? Procedure: GIVENS CAPSULE STUDY;  Surgeon: Rogene Houston, MD;  [facility];  Service: Endoscopy;  Laterality: N/A;  ? IR ANGIO INTRA EXTRACRAN SEL COM CAROTID INNOMINATE BILAT MOD SED  01/14/2018  ? IR ANGIO INTRA EXTRACRAN SEL COM CAROTID INNOMINATE BILAT MOD SED  05/11/2018  ? IR ANGIO VERTEBRAL SEL SUBCLAVIAN INNOMINATE UNI L MOD SED  01/14/2018  ? IR ANGIO VERTEBRAL SEL SUBCLAVIAN INNOMINATE UNI L MOD SED  05/11/2018  ? IR ANGIO VERTEBRAL SEL VERTEBRAL UNI R MOD SED  01/14/2018  ? IR ANGIO VERTEBRAL SEL VERTEBRAL UNI R MOD SED  05/11/2018  ? KNEE ARTHROSCOPY    ? left  ? LUMBAR DISC SURGERY    ? POLYPECTOMY  08/09/2018  ? Procedure: POLYPECTOMY;  Surgeon: Rogene Houston, MD;  [facility];  Service: Endoscopy;;  ascending colon (CS x 2, HS x5) hepatic flexure (HSx1), recto-sigmoid (HSx1)  ? TONSILLECTOMY    ? TOTAL ABDOMINAL HYSTERECTOMY    ? menopause/bleeding  ? ? ?Assessment / Plan / Recommendation ?Clinical  Impression Elizabeth [name] is a 81 year old female who presented to [facility] on [facility]2021 with an approximate 4 to 5-hour onset of left facial droop and left upper extremity weakness.  Telemetry neurologist was consulted and CT head without contrast and CTA performed.  No evidence of acute infarction, hemorrhage or hydrocephalus, extra-axial collection or mass lesion mass effect noted.  MRI not available.  Consequently, she was transferred to [facility], however no bed was available. She patient remained in the emergency department at [facility] and on 3/6 MRI was performed and confirmed lacunar infarct in the right internal capsule.  No  associated hemorrhage or mass effect.  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 The patient requires inpatient physical medicine and rehabilitation evaluations and treatment secondary to dysfunction due to lacunar infarct with left-sided weakness and dysphagia. Currently requiring mod to max assist. The patient's history is significant for prior CVA approximately 9 years ago with cerebral embolization coils placed. CTA of the neck revealed patent common carotid and internal carotid arteries without hemodynamically significant stenoses. Modified barium swallow revealed oropharyngeal dysphagia with moderate aspiration of thin liquids. She is tolerating a pur?ed level 4 diet with mildly thick liquids. ? ?Patient demonstrates cognitive impairments characterized by decreased sustained and selective attention, functional problem solving, recall of functional information, and emergent awareness which impacts her safety with functional and familiar tasks. Cognitive deficits appear mild-to-moderate in nature. SLP administered the [facility] Mental Status Examination (SLUMS) and patient scored 15/30 points with a score of 27 or above considered within normal range.  ? ?Patient's overall auditory comprehension and verbal expression appeared [facility] for all tasks assessed. Pt exhibited moderate dysarthria characterized by decreased articulatory precision, increased rate of speech, and reduced vocal intensity contributing to decreased speech intelligibility at the word level.  ? ?Oral mechanism exam was remarkable for moderate L facial weakness and asymmetry, reduced labial and lingual strength and ROM. Pt is currently consuming a Dysphagia 1 diet and nectar thick liquids with minimal signs of aspiration. SLP trialed thin liquids by spoon which resulted in immediate coughing. Recommend continuation of current diet. Crushed medications. Full supervision. ? ?Patient  would benefit from skilled SLP intervention to maximize her cognitive, speech, and swallow  functioning prior to discharge.  ?  ?Skilled Therapeutic Interventions          Pt participated in [place] Status Examination (SLUMS) as well as further non-standardized assessments of cognitive-linguistic, speech, language function, and clinical swallow evaluation. Please see above.    ?  ?SLP Assessment ? Patient will need skilled Speech Lanaguage Pathology Services during CIR admission  ?  ?Recommendations ? SLP Diet Recommendations: Dysphagia 1 (Puree);Thin ?Liquid Administration via: Straw;Cup ?Medication Administration: Crushed with puree ?Supervision: Full supervision/cueing for compensatory strategies;Patient able to self feed ?Compensations: Minimize environmental distractions;Slow rate;Small sips/bites;Monitor for anterior loss;Lingual sweep for clearance of pocketing ?Postural Changes and/or Swallow Maneuvers: Seated upright 90 degrees ?Oral Care Recommendations: Oral care BID ?Patient destination: Home ?Follow up Recommendations: Home Health SLP;24 hour supervision/assistance ?Equipment Recommended: To be determined  ?  ?SLP Frequency 3 to 5 out of 7 days   ?SLP Duration ? ?SLP Intensity ? ?SLP Treatment/Interventions 2.5-3 weeks ? ?Minumum of 1-2 x/day, 30 to 90 minutes ? ?Cognitive remediation/compensation;Internal/external aids;Speech/Language facilitation;Cueing hierarchy;Dysphagia/aspiration precaution training;Functional tasks;Patient/family education;Therapeutic Activities   ? ?Pain ?Pain Assessment ?Pain Scale: 0-10 ?Pain Score: 0-No painNone ? ?Prior Functioning ?Cognitive/Linguistic Baseline: Information not  available ?Type of Home: House ? Lives With: Spouse ?Available Help at Discharge: Family;Available 24 hours/day ?Vocation: Retired ? ?SLP Evaluation ?Cognition ?Orientation Level: Oriented X4;Other (comment) (can be disoriented) ?Problem Solving: Impaired ?Problem Solving  Impairment: Verbal basic  ?Comprehension ?Auditory Comprehension ?Overall Auditory Comprehension: Appears within functional limits for tasks assessed ?Expression ?Expression ?Primary Mode of Expression: Verbal ?Verbal

## 2021-11-20 NOTE — Progress Notes (Signed)
?                                                       PROGRESS NOTE ? ? ?Subjective/Complaints: ? 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 ?ROS- neg CP, SOB, N/V/D ?Objective: ?  ?No results found. ?Recent Labs  ?  11/20/21 ?0539  ?WBC 5.2  ?HGB 12.2  ?HCT 36.3  ?PLT 265  ? ?Recent Labs  ?  11/20/21 ?7673  ?NA 133*  ?K 4.0  ?CL 99  ?CO2 24  ?GLUCOSE 111*  ?BUN 20  ?CREATININE 0.69  ?CALCIUM 9.6  ? ?No intake or output data in the 24 hours ending 11/20/21 0826  ? ?  ? ?Physical Exam: ?Vital Signs ?Blood pressure 135/65, pulse 64, temperature 97.7 ?F (36.5 ?C), temperature source Oral, resp. rate 18, height '5\' 2"'$  (1.575 m), weight 63.4 kg, SpO2 95 %. ? ?General: No acute distress ?Mood and affect are appropriate ?Heart: Regular rate and rhythm no rubs murmurs or extra sounds ?Lungs: Clear to auscultation, breathing unlabored, no rales or wheezes ?Abdomen: Positive bowel sounds, soft nontender to palpation, nondistended ?Extremities: No clubbing, cyanosis, or edema ?Skin: No evidence of breakdown, no evidence of rash ?Neurologic: Cranial nerves II through XII intact, motor strength is 5/5 in bilateral deltoid, bicep, tricep, grip, hip flexor, knee extensors, ankle dorsiflexor and plantar flexor ?Sensory exam normal sensation to light touch and proprioception in bilateral upper and lower extremities ?Cerebellar exam normal finger to nose to finger as well as heel to shin in bilateral upper and lower extremities ?Musculoskeletal: Full range of motion in all 4 extremities. No joint swelling ? ? ? ?Assessment/Plan: ?1. Functional deficits which require 3+ hours per day of interdisciplinary therapy in a comprehensive inpatient rehab setting. ?Physiatrist is providing close team supervision and 24 hour management of active medical problems listed below. ?Physiatrist and rehab team continue to assess barriers to  discharge/monitor patient progress toward functional and medical goals ? ?Care Tool: ? ?Bathing ?   ?   ?   ?  ?  ?Bathing assist   ?  ?  ?Upper Body Dressing/Undressing ?Upper body dressing   ?  ?   ?Upper body assist   ?   ?Lower Body Dressing/Undressing ?Lower body dressing ? ? ?   ?  ? ?  ? ?Lower body assist   ?   ? ?Toileting ?Toileting    ?Toileting assist Assist for toileting: Moderate Assistance - Patient 50 - 74% [place] will yurn to be changed) ?  ?  ?Transfers ?Chair/bed transfer ? ?Transfers assist ?   ? ?  ?  ?  ?Locomotion ?Ambulation ? ? ?Ambulation assist ? ?   ? ?  ?  ?   ? ?Walk 10 feet activity ? ? ?Assist ?   ? ?  ?   ? ?Walk 50 feet activity ? ? ?Assist   ? ?  ?   ? ? ?Walk 150 feet activity ? ? ?Assist   ? ?  ?  ?  ? ?Walk 10 feet on uneven surface  ?activity ? ? ?Assist   ? ? ?  ?   ? ?Wheelchair ? ? ? ? ?  Assist   ?  ?  ? ?  ?   ? ? ?Wheelchair 50 feet with 2 turns activity ? ? ? ?Assist ? ?  ?  ? ? ?   ? ?Wheelchair 150 feet activity  ? ? ? ?Assist ?   ? ? ?   ? ?Blood pressure 135/65, pulse 64, temperature 97.7 ?F (36.5 ?C), temperature source Oral, resp. rate 18, height '5\' 2"'$  (1.575 m), weight 63.4 kg, SpO2 95 %. ? ?Medical Problem List and Plan: ?1. 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            -patient may shower ?            -ELOS/Goals: 14-20 days, min assist to supervision goals, therapy evals today  ?2.  Antithrombotics: ?-DVT/anticoagulation:  Pharmaceutical: Lovenox ?            -antiplatelet therapy: Plavix and aspirin ?3. Pain Management: Tylenol ?4. Mood: LCSW to evaluate and provide emotional support ?            -antipsychotic agents: Not applicable ?5. Neuropsych: This patient is capable of making decisions on her own behalf. ?6. Skin/Wound Care: Routine skin care checks ?7. Fluids/Electrolytes/Nutrition: Routine I's and O's and follow-up chemistries ?Dysphagia , MBS  performed at OSH ?            -- Pur?ed level 4 diet, thickened liquids. ?            -- Crushed meds in applesauce ?            -- 1 to 1 supervision with meals ?            -- SLP eval on admission ?8: DM-2: carb modified diet ?--CBGs and SSI (no home meds) ?CBG (last 3)  ?Recent Labs  ?  11/19/21 ?1708 11/19/21 ?2101 11/20/21 ?0605  ?GLUCAP 140* 92 126*  ? ? ?9: Seasonal allergies: Continue symptomatic relief as needed ?10: GERD: Continue Protonix ?11: COPD: Continue albuterol nebs as needed (no home meds)- smoking hx  ?            --no supplemental O2 needs ?12: Hypokalemia: resolved with supplementation>>>continue and follow-up BMP ?BMP Latest Ref Rng & Units 11/20/2021 08/26/2021 06/10/2021  ?Glucose 70 - 99 mg/dL 111(H) 179(H) -  ?BUN 8 - 23 mg/dL 20 11 -  ?Creatinine 0.44 - 1.00 mg/dL 0.69 0.78 0.80  ?BUN/Creat Ratio 12 - 28 - 14 -  ?Sodium 135 - 145 mmol/L 133(L) 135 -  ?Potassium 3.5 - 5.1 mmol/L 4.0 3.6 -  ?Chloride 98 - 111 mmol/L 99 99 -  ?CO2 22 - 32 mmol/L 24 21 -  ?Calcium 8.9 - 10.3 mg/dL 9.6 9.9 -  ? ? ?13: Hyperlipidemia: intolerant of all statins (no other alternatives listed on home meds) ?14: Hypertension: Controlled off home amlodipine ?             ?Vitals:  ? 11/19/21 1926 11/20/21 0442  ?BP: (!) 145/64 135/65  ?Pulse: 84 64  ?Resp: 18 18  ?Temp: 98.4 ?F (36.9 ?C) 97.7 ?F (36.5 ?C)  ?SpO2: 98% 95%  ?  ?15: Chronic right knee pain: will monitor how this affects therapy , no NSAIDs due to CVA , may use tylenol  ?  ? ?LOS: ?1 days ?A FACE TO FACE EVALUATION WAS PERFORMED ? ?Luanna Salk Brandalyn Harting ?11/20/2021, 8:26 AM  ? ? ? ?

## 2021-11-20 NOTE — Discharge Summary (Signed)
Physician Discharge Summary  ?Patient ID: ?Elizabeth [name] ?MRN: [id number] ?DOB/AGE: [birth year] 81 y.o. ? ?Admit date: 11/19/2021 ?Discharge date: 12/11/2021 ? ?Discharge Diagnoses:  ?Principal Problem: ?  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 ?Discharged Condition: stable ? ?Significant Diagnostic Studies: ?DG Chest 2 View ? ?Result Date: 11/27/2021 ?CLINICAL DATA:  Cough. EXAM: CHEST - 2 VIEW COMPARISON:  Chest x-ray dated November 19, 2021. FINDINGS: The heart size and mediastinal contours are within normal limits. Both lungs are clear. The visualized skeletal structures are unremarkable. IMPRESSION: No active cardiopulmonary disease. Electronically Signed   By: Titus Dubin M.D.   On: 11/27/2021 11:26   ? ?Labs:  ?Basic Metabolic Panel: ?Recent Labs  ?Lab 12/10/21 ?3785  ?NA 136  ?K 3.9  ?CL 102  ?CO2 25  ?GLUCOSE 103*  ?BUN 17  ?CREATININE 0.72  ?CALCIUM 9.8  ? ? ?CBC: ?Recent Labs  ?Lab 12/10/21 ?8850  ?WBC 6.6  ?HGB 11.9*  ?HCT 36.0  ?MCV 82.4  ?PLT 183  ? ? ?CBG: ?Recent Labs  ?Lab 12/10/21 ?2774 12/10/21 ?1207 12/10/21 ?1641 12/10/21 ?2103 12/11/21 ?1287  ?GLUCAP 125* 117* 98 120* 113*  ? ? ?Brief HPI:   Elizabeth [name] is a 81 y.o. female who presented to [facility] on 11/08/2021 with left facial droop and left upper extremity weakness.  CT scanning did not reveal acute infarction or hemorrhage and MRI was not initially available.  No bed available for transfer.  She remained in the emergency department at [facility] in 03/6 MRI was performed to confirm lacunar infarct in the right internal capsule.  Plavix and aspirin were initiated.  She remained hemodynamically stable. ? ? ?Hospital Course: Elizabeth [name] was admitted to rehab 11/19/2021 for inpatient therapies to consist of PT, ST and OT at least three hours five days a week. Past admission physiatrist, therapy team  and rehab RN have worked together to provide customized collaborative inpatient rehab.  On admission, she was consuming a dysphagia 1 diet with nectar thick liquids with minimal signs of aspiration.  SLP evaluation recommended continuation of current diet with crushed medications and full supervision. Left hand splint placed. ?Complained of urinary frequency 3/21 and history of OAB (Dr. Jeffie Pollock). Urinalysis performed>> ++ leukocytes, many bacteria + nitrite. Keflex started. Attempted to obtain cath specimen, but due to anatomy, staff unable to pass catheter after 5 attempts on 3/22. Urine culture positive for E. Coli.  Susceptibilities>>Keflex started for 7 days. ?Hypokalemia resolved. Noticible improvement made in LUE strength. Resting hand splint 3/27.  Left wrist flexor tendinitis 3/30. 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 (She is followed by Dr. Laural Golden). Stable for discharge on 12/11/2021 ? ?Blood pressures were monitored on TID basis and were trended for mild elevation off her home amlodipine. Restarted at 5 mg daily (half home dose) on 3/20. Early morning elevation noted and observed. Increased to home dose of 10 mg daily on 3/29 with good control. ? ?Diabetes has been monitored with ac/hs CBG checks and SSI was use prn for tighter BS control. Rarely receiving insulin therefore reduced to BID checks 3/21. Diet controlled 3/24. ?May resume home Jardiance. Encouraged patient to increase CBG to 4 x day and follow-up with PCP in 2-3 weeks. ? ?Rehab course: During patient's stay in rehab weekly team conferences were held to monitor patient's progress, set goals and discuss barriers to discharge. At  admission, patient required maximal assistance for bed mobility, transfers and dependent for locomotion.  Progressed to min-mod assist with RW and left lateral lean noted. ? ?She has had improvement in activity tolerance, balance, postural control as well as ability to compensate for deficits. She  has had improvement in functional use RUE/LUE  and RLE/LLE as well as improvement in awareness ?  ? ?Disposition: Home ?Discharge disposition: 01-Home or Self Care ? ? ? ? ? ?Diet: Carb modified ? ?Special Instructions: ? ?Call Dr. Laural Golden to make arrangements for follow-up visit and ultrasound. ? ?Call PCP to reschedule follow-up appointment in 2 weeks. ? ?No driving, alcohol consumption or tobacco use.  ? ?30-35 minutes were spent on discharge planning and discharge summary.  ?Discharge Instructions   ? ? Ambulatory referral to Neurology   Complete by: As directed ?  ? An appointment is requested in approximately: 2-4 weeks. Follow-up hospitalization/stroke  ? Ambulatory referral to Physical Medicine Rehab   Complete by: As directed ?  ? Hospital follow-up  ? Discharge patient   Complete by: As directed ?  ? Discharge disposition: 01-Home or Self Care  ? Discharge patient date: 12/11/2021  ? ?  ? ?Allergies as of 12/11/2021   ? ?   Reactions  ? Banana Anaphylaxis  ? Codeine Nausea And Vomiting, Other (See Comments)  ? PROJECTILE VOMITING  ? Stevia [stevioside] Swelling, Other (See Comments)  ? Face, tongue, and eye swelling   ? Actos [pioglitazone] Swelling  ? Benicar [olmesartan Medoxomil] Other (See Comments)  ? Feel bad  ? Chantix [varenicline] Other (See Comments)  ? "Worked opposite."  ? Diovan [valsartan] Other (See Comments)  ? Feel bad  ? Lipitor [atorvastatin] Other (See Comments)  ? REACTION:  Joint pain  ? Rice Other (See Comments)  ? Gives patient indigestion  ? Spinach Other (See Comments)  ? Stomach problems  ? Vicodin [hydrocodone-acetaminophen] Nausea And Vomiting  ? Zocor [simvastatin] Other (See Comments)  ? REACTION:  "made liver function incorrectly"  ? Ace Inhibitors Cough  ? Celebrex [celecoxib] Rash  ? Latex Rash, Other (See Comments)  ? REACTION: red rash  ? Metformin And Related   ? GI - upset   ? Mushroom Extract Complex Palpitations  ? Penicillins Rash, Other (See Comments)  ? Has patient had  a PCN reaction causing immediate rash, facial/tongue/throat swelling, SOB or lightheadedness with hypotension: No ?Has patient had a PCN reaction causing severe rash involving mucus membranes or skin necrosis: No ?Has patient had a PCN reaction that required hospitalization: No- MD office ?Has patient had a PCN reaction occurring within the last 10 years: No ?If all of the above answers are "NO", then may proceed with Cephalosporin use.  ? Sulfonamide Derivatives Rash  ? ?  ? ?  ?Medication List  ?  ? ?STOP taking these medications   ? ?cyclobenzaprine 5 MG tablet ?Commonly known as: FLEXERIL ?  ? ?  ? ?TAKE these medications   ? ?acetaminophen 325 MG tablet ?Commonly known as: TYLENOL ?Take 1-2 tablets (325-650 mg total) by mouth every 4 (four) hours as needed for mild pain. ?  ?albuterol 108 (90 Base) MCG/ACT inhaler ?Commonly known as: VENTOLIN HFA ?INHALE 2 PUFFS EVERY SIX HOURS AS NEEDED FOR WHEEZING OR SHORTNESS OF BREATH ?  ?amLODipine 10 MG tablet ?Commonly known as: NORVASC ?TAKE 1 TABLET BY MOUTH EVERY DAY ?  ?aspirin 325 MG tablet ?Take 325 mg by mouth daily. ?  ?cetirizine 10 MG tablet ?Commonly known  as: ZYRTEC ?Take 10 mg by mouth daily. ?  ?clopidogrel 75 MG tablet ?Commonly known as: PLAVIX ?TAKE ONE TABLET BY MOUTH DAILY ?What changed:  ?how much to take ?how to take this ?when to take this ?additional instructions ?  ?empagliflozin 10 MG Tabs tablet ?Commonly known as: Jardiance ?Take 1 tablet (10 mg total) by mouth daily before breakfast. ?  ?methocarbamol 500 MG tablet ?Commonly known as: ROBAXIN ?Take 1 tablet (500 mg total) by mouth every 6 (six) hours as needed for muscle spasms. ?  ?pantoprazole 40 MG tablet ?Commonly known as: PROTONIX ?TAKE 1 TABLET BY MOUTH DAILY BEFORE BREAKFAST ?What changed: when to take this ?  ?ursodiol 300 MG capsule ?Commonly known as: ACTIGALL ?Take 1 capsule (300 mg total) by mouth 3 (three) times daily. ?  ? ?  ? ? Follow-up Information   ? ? Renee Rival,  FNP. Call.   ?Specialty: Nurse Practitioner ?Why: Ask to reschedule follow-up appointment (hospital follow-up). Take discharge packet with you. ?Contact information: ?[address] ?[address]

## 2021-11-20 NOTE — Progress Notes (Signed)
Inpatient Rehabilitation Admission Medication Review by a Pharmacist ? ?A complete drug regimen review was completed for this patient to identify any potential clinically significant medication issues. ? ?High Risk Drug Classes Is patient taking? Indication by Medication  ?Antipsychotic Yes Compazine- N/V  ?Anticoagulant Yes Lovenox- VTE prophylaxis  ?Antibiotic No   ?Opioid No   ?Antiplatelet Yes Aspirin, Plavix- CVA prophylaxis  ?Hypoglycemics/insulin Yes iSS- T2DM  ?Vasoactive Medication No   ?Chemotherapy No   ?Other Yes Claritin- allergies ?Protonix- GERD  ? ? ? ?Type of Medication Issue Identified Description of Issue Recommendation(s)  ?Drug Interaction(s) (clinically significant) ?    ?Duplicate Therapy ?    ?Allergy ?    ?No Medication Administration End Date ?    ?Incorrect Dose ?    ?Additional Drug Therapy Needed ?    ?Significant med changes from prior encounter (inform family/care partners about these prior to discharge).    ?Other ? PTA meds: ?Norvasc ?Jardiance ?Actigall Restart PTA meds when and if clinically necessary during CIR admission or at time of discharge, if warranted.   ? ? ?Clinically significant medication issues were identified that warrant physician communication and completion of prescribed/recommended actions by midnight of the next day:  No ? ?Time spent performing this drug regimen review (minutes):  30 ? ? ?Judee Hennick BS, PharmD, BCPS ?Clinical Pharmacist ?11/20/2021 6:50 AM ?

## 2021-11-20 NOTE — Plan of Care (Signed)
?  Problem: RH Expression Communication ?Goal: LTG Patient will increase speech intelligibility (SLP) ?Description: LTG: Patient will increase speech intelligibility at word/phrase/conversation level with cues, % of the time (SLP) ?Flowsheets (Taken 11/20/2021 1241) ?LTG: Patient will increase speech intelligibility (SLP): Supervision ?Level: Phrase ?Percent of time patient will use intelligible speech: 75% ?  ?Problem: RH Problem Solving ?Goal: LTG Patient will demonstrate problem solving for (SLP) ?Description: LTG:  Patient will demonstrate problem solving for basic/complex daily situations with cues  (SLP) ?Flowsheets (Taken 11/20/2021 1241) ?LTG: Patient will demonstrate problem solving for (SLP): ? Basic daily situations ? Complex daily situations ?LTG Patient will demonstrate problem solving for: Supervision ?  ?Problem: RH Memory ?Goal: LTG Patient will demonstrate ability for day to day (SLP) ?Description: LTG:   Patient will demonstrate ability for day to day recall/carryover during cognitive/linguistic activities with assist  (SLP) ?Flowsheets (Taken 11/20/2021 1241) ?LTG: Patient will demonstrate ability for day to day recall/carryover during cognitive/linguistic activities with assist (SLP): Supervision ?Goal: LTG Patient will use memory compensatory aids to (SLP) ?Description: LTG:  Patient will use memory compensatory aids to recall biographical/new, daily complex information with cues (SLP) ?Flowsheets (Taken 11/20/2021 1241) ?LTG: Patient will use memory compensatory aids to (SLP): Modified Independent ?  ?Problem: RH Awareness ?Goal: LTG: Patient will demonstrate awareness during functional activites type of (SLP) ?Description: LTG: Patient will demonstrate awareness during functional activites type of (SLP) ?Flowsheets (Taken 11/20/2021 1241) ?Patient will demonstrate during cognitive/linguistic activities awareness type of: Emergent ?LTG: Patient will demonstrate awareness during cognitive/linguistic  activities with assistance of (SLP): Supervision ?  ?

## 2021-11-21 DIAGNOSIS — I6381 Other cerebral infarction due to occlusion or stenosis of small artery: Secondary | ICD-10-CM | POA: Diagnosis not present

## 2021-11-21 LAB — GLUCOSE, CAPILLARY
Glucose-Capillary: 122 mg/dL — ABNORMAL HIGH (ref 70–99)
Glucose-Capillary: 113 mg/dL — ABNORMAL HIGH (ref 70–99)
Glucose-Capillary: 93 mg/dL (ref 70–99)
Glucose-Capillary: 121 mg/dL — ABNORMAL HIGH (ref 70–99)

## 2021-11-21 NOTE — Progress Notes (Signed)
?                                                       PROGRESS NOTE ? ? ?Subjective/Complaints: ? ?Reviewed labs , pt frustrated about dropping newspaper when trying to read  ? ?ROS- neg CP, SOB, N/V/D ?Objective: ?  ?No results found. ?Recent Labs  ?  11/20/21 ?1062  ?WBC 5.2  ?HGB 12.2  ?HCT 36.3  ?PLT 265  ? ? ?Recent Labs  ?  11/20/21 ?6948  ?NA 133*  ?K 4.0  ?CL 99  ?CO2 24  ?GLUCOSE 111*  ?BUN 20  ?CREATININE 0.69  ?CALCIUM 9.6  ? ? ? ?Intake/Output Summary (Last 24 hours) at 11/21/2021 0808 ?Last data filed at 11/20/2021 1811 ?Gross per 24 hour  ?Intake 240 ml  ?Output --  ?Net 240 ml  ?  ? ?  ? ?Physical Exam: ?Vital Signs ?Blood pressure (!) 149/66, pulse (!) 56, temperature 97.6 ?F (36.4 ?C), temperature source Oral, resp. rate 18, height '5\' 2"'$  (1.575 m), weight 63.4 kg, SpO2 96 %. ? ?General: No acute distress ?Mood and affect are appropriate ?Heart: Regular rate and rhythm no rubs murmurs or extra sounds ?Lungs: Clear to auscultation, breathing unlabored, no rales or wheezes ?Abdomen: Positive bowel sounds, soft nontender to palpation, nondistended ?Extremities: No clubbing, cyanosis, or edema ?Skin: No evidence of breakdown, no evidence of rash ?Neurologic: Cranial nerves II through XII intact, motor strength is 5/5 in bilateral deltoid, bicep, tricep, grip, hip flexor, knee extensors, ankle dorsiflexor and plantar flexor ?Sensory exam normal sensation to light touch and proprioception in bilateral upper and lower extremities ?Cerebellar exam normal finger to nose to finger as well as heel to shin in bilateral upper and lower extremities ?Musculoskeletal: Full range of motion in all 4 extremities. No joint swelling ? ? ? ?Assessment/Plan: ?1. Functional deficits which require 3+ hours per day of interdisciplinary therapy in a comprehensive inpatient rehab setting. ?Physiatrist is providing close team supervision and 24 hour management of active medical problems listed below. ?Physiatrist and rehab team  continue to assess barriers to discharge/monitor patient progress toward functional and medical goals ? ?Care Tool: ? ?Bathing ?   ?Body parts bathed by patient: Left arm, Chest, Abdomen, Right upper leg, Left upper leg, Right lower leg, Left lower leg, Face  ? Body parts bathed by helper: Right arm ?  ?  ?Bathing assist Assist Level: Moderate Assistance - Patient 50 - 74% ?  ?  ?Upper Body Dressing/Undressing ?Upper body dressing   ?What is the patient wearing?: Pull over shirt ?   ?Upper body assist Assist Level: Maximal Assistance - Patient 25 - 49% ?   ?Lower Body Dressing/Undressing ?Lower body dressing ? ? ?   ?What is the patient wearing?: Underwear/pull up ? ?  ? ?Lower body assist Assist for lower body dressing: Moderate Assistance - Patient 50 - 74% ?   ? ?Toileting ?Toileting Toileting Activity did not occur (Probation officer and hygiene only): N/A (no void or bm)  ?Toileting assist Assist for toileting: Moderate Assistance - Patient 50 - 74% [place] will yurn to be changed) ?  ?  ?Transfers ?Chair/bed transfer ? ?Transfers assist ?   ? ?Chair/bed transfer assist level: Maximal Assistance - Patient 25 - 49% ?  ?  ?Locomotion ?Ambulation ? ? ?Ambulation assist ? ?  Ambulation activity did not occur: Safety/medical concerns ? ?  ?  ?   ? ?Walk 10 feet activity ? ? ?Assist ? Walk 10 feet activity did not occur: Safety/medical concerns ? ?  ?   ? ?Walk 50 feet activity ? ? ?Assist Walk 50 feet with 2 turns activity did not occur: Safety/medical concerns ? ?  ?   ? ? ?Walk 150 feet activity ? ? ?Assist Walk 150 feet activity did not occur: Safety/medical concerns ? ?  ?  ?  ? ?Walk 10 feet on uneven surface  ?activity ? ? ?Assist Walk 10 feet on uneven surfaces activity did not occur: Safety/medical concerns ? ? ?  ?   ? ?Wheelchair ? ? ? ? ?Assist Is the patient using a wheelchair?: Yes ?Type of Wheelchair: Manual ?  ? ?Wheelchair assist level: Dependent - Patient 0% ?Max wheelchair distance: 150   ? ? ?Wheelchair 50 feet with 2 turns activity ? ? ? ?Assist ? ?  ?  ? ? ?Assist Level: Dependent - Patient 0%  ? ?Wheelchair 150 feet activity  ? ? ? ?Assist ?   ? ? ?Assist Level: Dependent - Patient 0%  ? ?Blood pressure (!) 149/66, pulse (!) 56, temperature 97.6 ?F (36.4 ?C), temperature source Oral, resp. rate 18, height '5\' 2"'$  (1.575 m), weight 63.4 kg, SpO2 96 %. ? ?Medical Problem List and Plan: ?1. 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            -patient may shower ?            -ELOS/Goals: 14-20 days, min assist to supervision goals, therapy evals today  ?2.  Antithrombotics: ?-DVT/anticoagulation:  Pharmaceutical: Lovenox ?            -antiplatelet therapy: Plavix and aspirin ?3. Pain Management: Tylenol ?4. Mood: LCSW to evaluate and provide emotional support ?            -antipsychotic agents: Not applicable ?5. Neuropsych: This patient is capable of making decisions on her own behalf. ?6. Skin/Wound Care: Routine skin care checks ?7. Fluids/Electrolytes/Nutrition: Routine I's and O's and follow-up chemistries ?Dysphagia , MBS performed at OSH ?            -- Pur?ed level 4 diet, thickened liquids. ?            -- Crushed meds in applesauce ?            -- 1 to 1 supervision with meals ?            -- SLP eval on admission ?8: DM-2: carb modified diet ?--CBGs and SSI (no home meds) ?CBG (last 3)  ?Recent Labs  ?  11/20/21 ?1644 11/20/21 ?2105 11/21/21 ?0619  ?GLUCAP 103* 129* 122*  ? ? ? ?9: Seasonal allergies: Continue symptomatic relief as needed ?10: GERD: Continue Protonix ?11: COPD: Continue albuterol nebs as needed (no home meds)- smoking hx  ?            --no supplemental O2 needs ?12: Hypokalemia: resolved with supplementation>>>continue and follow-up BMP ?BMP Latest Ref Rng & Units 11/20/2021 08/26/2021 06/10/2021  ?Glucose 70 - 99 mg/dL 111(H) 179(H) -  ?BUN 8 - 23 mg/dL 20 11 -  ?Creatinine  0.44 - 1.00 mg/dL 0.69 0.78 0.80  ?BUN/Creat Ratio 12 - 28 - 14 -  ?Sodium 135 - 145 mmol/L 133(L) 135 -  ?  Potassium 3.5 - 5.1 mmol/L 4.0 3.6 -  ?Chloride 98 - 111 mmol/L 99 99 -  ?CO2 22 - 32 mmol/L 24 21 -  ?Calcium 8.9 - 10.3 mg/dL 9.6 9.9 -  ? ? ?13: Hyperlipidemia: intolerant of all statins (no other alternatives listed on home meds) ?14: Hypertension: Controlled off home amlodipine ?             ?Vitals:  ? 11/20/21 2005 11/21/21 0509  ?BP: 131/66 (!) 149/66  ?Pulse: 70 (!) 56  ?Resp: 17 18  ?Temp: 98.1 ?F (36.7 ?C) 97.6 ?F (36.4 ?C)  ?SpO2: 95% 96%  ?  ?15: Chronic right knee pain: will monitor how this affects therapy , no NSAIDs due to CVA , may use tylenol  ?  ? ?LOS: ?2 days ?A FACE TO FACE EVALUATION WAS PERFORMED ? ?Luanna Salk [place] Seto ?11/21/2021, 8:08 AM  ? ? ? ?

## 2021-11-21 NOTE — Progress Notes (Signed)
Physical Therapy Session Note ? ?Patient Details  ?Name: Elizabeth [name] ?MRN: [id number] ?Date of Birth: 05/27/1941 ? ?Today's Date: 11/21/2021 ?PT Individual Time: 1303-1400 ?PT Individual Time Calculation (min): 57 min  ? ?Short Term Goals: ?Week 1:  PT Short Term Goal 1 (Week 1): Pt will transfer to WC with min assist and LRAD ?PT Short Term Goal 2 (Week 1): Pt will ambulate with RW x 50ft with mod assist ?PT Short Term Goal 3 (Week 1): Pt will propell WC 100ft with min assist ? ? ?Skilled Therapeutic Interventions/Progress Updates:  ? ?Pt received supine in bed and agreeable to PT. Supine>sit transfer with mod assist and cues for improved trunk control and midline orientation to prevent LOB to the R on the this day. Stand pivot transfer to WC with mod assist and LLE blocked. Pt transported to rehab gym in WC. Sit<>stand with RW x 2 with min assist and cues for attention to task. Pregait steps with BUE support on RW 2 x 6 BLE with hand splint in place on the second bout. Gait training with RW and hand splint x 50ft and 65ft with mod assist and +2 for WC follow with max cues for attention to task cues for improved step height/length on the LLE throughout. Noted to sustain gait in flexed knee posture but no buckling noted.  ? ?kinetron reciprocal movement training 4 x 2 min with cues for improved ROM and reciprocal movement pattern throughout as well as cues for attention to task throughout. Pt returned to room and performed stand pivot transfer to bed with mod assist. Sit>supine completed with mod assist at BLE, and left supine in bed with call bell in reach and all needs met.  ? ? ?   ? ?Therapy Documentation ?Precautions:  ?Precautions ?Precautions: Fall ?Precaution Comments: L hemiparesis ?Restrictions ?Weight Bearing Restrictions: No ? ?Vital Signs: ?Therapy Vitals ?Temp: 97.8 ?F (36.6 ?C) ?Temp Source: Oral ?Pulse Rate: 63 ?Resp: 16 ?BP: (!) 132/96 ?Patient Position (if appropriate): Lying ?Oxygen Therapy ?SpO2: 99  % ?O2 Device: Room Air ?Pain: ?Denies  ? ? ? ?Therapy/Group: Individual Therapy ? ? E  ?11/21/2021, 3:29 PM  ?

## 2021-11-21 NOTE — Progress Notes (Signed)
Occupational Therapy Session Note ? ?Patient Details  ?Name: Elizabeth [name] ?MRN: [id number] ?Date of Birth: 1941/07/13 ? ?Today's Date: 11/21/2021 ?OT Individual Time: 2993-7169 ?OT Individual Time Calculation (min): 45 min  ? ? ?Short Term Goals: ?Week 1:  OT Short Term Goal 1 (Week 1): Pt will maintain balance seated EOB > 8 min with no more than CGA in prep for seated ADL. ?OT Short Term Goal 2 (Week 1): Pt will complete toilet transfer with max A and LRAD. ?OT Short Term Goal 3 (Week 1): Pt will complete LBD with max A and LRAD. ?OT Short Term Goal 4 (Week 1): Pt will don shirt with mod A. ? ?Skilled Therapeutic Interventions/Progress Updates:  ?  Ptr esting in w/c upon arrival with husband present. OT intervention with focus on functional tranfsers, sitting balance, LUE NMR, attention to task, and safety awareness to increase independence with BADLs. Pt required repositioning in w/c before leaving room. Max A with max multimodal cues for reciprocal scooting in w/c. Max A for squat pivot transfers to EOM. LUE NMR per below. Mod A for lateral leans to R/L. Pt reqiured max A for anterior leans. Pt will tendency to lean posteriorly and requires max verbal cues to correct. Pt with weak grasp of large Lego blocks with Lt hand. Pt engaged in reaching tasks while WB thru [place]. Pt returned to room. Belt alarm activated. Half lap tray in place. Husband present.  ? ?Therapy Documentation ?Pain: ?Pain Assessment ?Pain Scale: 0-10 ?Pain Score: 0-No pain ?  ?Other Treatments: Treatments ?Neuromuscular Facilitation: Left;Upper Extremity;Forced use;Activity to increase motor control;Activity to increase sustained activation;Activity to increase lateral weight shifting;Activity to increase anterior-posterior weight shifting ?Weight Bearing Technique ?Weight Bearing Technique: Yes ?LUE Weight Bearing Technique: Forearm seated ?Response to Weight Bearing Technique: improved hand and shoulder activation while seated during  functional tasks ? ? ?Therapy/Group: Individual Therapy ? ?Leroy Libman ?11/21/2021, 12:08 PM ?

## 2021-11-21 NOTE — Progress Notes (Signed)
Occupational Therapy Session Note ? ?Patient Details  ?Name: Elizabeth [name] ?MRN: [id number] ?Date of Birth: 1941-04-25 ? ?Today's Date: 11/21/2021 ?OT Individual Time: 0702-0801 ?OT Individual Time Calculation (min): 59 min  ? ? ?Short Term Goals: ?Week 1:  OT Short Term Goal 1 (Week 1): Pt will maintain balance seated EOB > 8 min with no more than CGA in prep for seated ADL. ?OT Short Term Goal 2 (Week 1): Pt will complete toilet transfer with max A and LRAD. ?OT Short Term Goal 3 (Week 1): Pt will complete LBD with max A and LRAD. ?OT Short Term Goal 4 (Week 1): Pt will don shirt with mod A. ? ?Skilled Therapeutic Interventions/Progress Updates:  ?  Pt received asleep in bed, awakened to voice, denies pain, agreeable to therapy with encouragement. Session focus on self-care retraining, activity tolerance, transfer retraining in prep for improved ADL/IADL/func mobility performance + decreased caregiver burden. Came to sitting EOB with light max A to progress hemi-body off bed and to lift trunk, cues for technique throughout. Overall consistent min A to maintain EOB balance due to posterior bias, one slow LOB onto bed with poor ability to self-correct. Stand-pivot with light max A to w/c on her R due to mild L lean and for facilitating weight shift, pt with significant difficulty motor planning how to scoot back in chair herself requiring total A to safely complete. Doffed shirt at sink with max A to pull over head and off BUE, cues for sequencing, but ultimately required physical assist to initiate each step. Bathed UB with overall mod A to incorporate LUE functionally to bathe RUE. Donned shirt with max A to thread BUE and pull down in back. Donned pants with light mod A to power up at sink and maintain upright posture, +2 present to pull up pants. ? ?Stand-pivot to and from toilet with max A , +2 present to manage LB clothing. No void after extended time given. Washed hands seated at sink with mod physical assist to  incorporate L hand. Completed oral care with suction toothbrush with min A to cover suction hole. ? ?Set-up A for breakfast items, therapist rearranging tray to force attention to L side. S provided throughout. Tray removed and NT notified to continue to provide S to finish breakfast. ? ? ?Pt left seated in w/c with safety belt alarm engaged, call bell in reach, and all immediate needs met.  ? ? ?Therapy Documentation ?Precautions:  ?Precautions ?Precautions: Fall ?Precaution Comments: L hemiparesis ?Restrictions ?Weight Bearing Restrictions: No ? ?Pain: denies ?  ?ADL: See Care Tool for more details. ? ?Therapy/Group: Individual Therapy ? ?Volanda Napoleon MS, OTR/L ? ?11/21/2021, 6:49 AM ?

## 2021-11-21 NOTE — Progress Notes (Signed)
Speech Language Pathology Daily Session Note ? ?Patient Details  ?Name: Elizabeth [name] ?MRN: [id number] ?Date of Birth: 1941-08-16 ? ?Today's Date: 11/21/2021 ?SLP Individual Time: 1308-6578 ?SLP Individual Time Calculation (min): 45 min ? ?Short Term Goals: ?Week 1: SLP Short Term Goal 1 (Week 1): Patient will consume current diet without overt s/sx of aspiration with min A verbal cues for use of swallowing compensatory strategies ?SLP Short Term Goal 2 (Week 1): Patient will consume ice chips/thin liquid trials with min overt s/sx of aspiration to indicate readiness for MBS. ?SLP Short Term Goal 3 (Week 1): Patient will utilize speech intelligiblity strategies at the word and phrase level with min-to-mod A cues to achieve 75%+ intelligiblity ?SLP Short Term Goal 4 (Week 1): Patient will complete mildly complex problem solving tasks with min-to-mod A verbal/visual cues ?SLP Short Term Goal 5 (Week 1): Patient will demonstrate awareness to errors during functional and novel tasks with min-to-mod A verbal/visual cues ?SLP Short Term Goal 6 (Week 1): Patient will recall and implement up to 2 beneficial compensatory memory strategies to recall new, daily information with min-to-mod A verbal and question cues ? ?Skilled Therapeutic Interventions: Skilled ST treatment focused on speech and swallowing goals. SLP facilitated dys 2 PO trials. Pt exhibited impaired mastication, delayed AP transit, mild oral residue, and immediate explosive coughing. Pt attempted to take sips of nectar thick liquids mid-cough and required tactile cues to prevent this from occurring. Pt exhibited intermittent s/sx of aspiration with nectar thick cup and straw sips, but reduced with min A cues to take "small single" sips. Recommend patient continue with current diet and full supervision during meals for swallow safety. Spouse arrived at end of session and SLP requested for him to bring in patient's partial denture next time which may improve  mastication effectiveness. Spouse agreeable. SLP then facilitated session by providing min-to-mod A verbal cues to implement speech strategies to achieve 75-90% accuracy at a phrase level while discussing food preferences. SLP placed SLOP (i.e., speak slow, loud, over-articulate, pause) strategies handout on wall as external aid. SLP wrote down food preferences to inform staff and food & nutrition services per pt's request. Posted on pt's door. Patient was left in wheelchair with alarm activated and immediate needs within reach at end of session. Continue per current plan of care.   ?   ?Pain ?Pain Assessment ?Pain Scale: 0-10 ?Pain Score: 0-No pain ? ?Therapy/Group: Individual Therapy ? ?Dynasia Kercheval T Kristianna Saperstein ?11/21/2021, 10:07 AM ?

## 2021-11-22 LAB — GLUCOSE, CAPILLARY
Glucose-Capillary: 127 mg/dL — ABNORMAL HIGH (ref 70–99)
Glucose-Capillary: 99 mg/dL (ref 70–99)
Glucose-Capillary: 122 mg/dL — ABNORMAL HIGH (ref 70–99)
Glucose-Capillary: 140 mg/dL — ABNORMAL HIGH (ref 70–99)

## 2021-11-22 NOTE — IPOC Note (Signed)
Overall Plan of Care (IPOC) ?Patient Details ?Name: Elizabeth [name] ?MRN: [id number] ?DOB: [birth year] ? ?Admitting Diagnosis: Right-sided lacunar stroke ([place]) ? ?Hospital Problems: Principal Problem: ?  Right-sided lacunar stroke [facility]) ? ? ? ? Functional Problem List: ?Nursing Bladder, Bowel, Endurance, Medication Management, Safety  ?PT Balance, Behavior, Edema, Motor, Perception, Safety, Sensory  ?OT Balance, Sensory, Cognition, Skin Integrity, Vision, Endurance, Motor, Perception, Safety  ?SLP Cognition, Nutrition, Motor  ?TR    ?    ? Basic ADL?s: ?OT Eating, Grooming, Bathing, Toileting, Dressing  ? ?  Advanced  ADL?s: ?OT Light Housekeeping  ?   ?Transfers: ?PT Bed Mobility, Bed to Chair, Car, Furniture  ?OT Toilet, Tub/Shower  ? ?  Locomotion: ?PT Wheelchair Mobility, Stairs, Ambulation  ? ?  Additional Impairments: ?OT Fuctional Use of Upper Extremity  ?SLP Swallowing, Social Cognition ?  ?Memory, Problem Solving, Attention, Awareness  ?TR    ? ? ?Anticipated Outcomes ?Item Anticipated Outcome  ?Self Feeding S  ?Swallowing ? sup A ?  ?Basic self-care ? CGA to min A  ?Toileting ? CGA ?  ?Bathroom Transfers CGA  ?Bowel/Bladder ? manage bowel w mod I and bladder w cues  ?Transfers ? CGA with LRAD  ?Locomotion ? ambulatory CGA with LRAD  ?Communication ? sup A  ?Cognition ? sup-to-min A  ?Pain ? N/A  ?Safety/Judgment ? maintain safety w cues  ? ?Therapy Plan: ?PT Intensity: Minimum of 1-2 x/day ,45 to 90 minutes ?PT Frequency: 5 out of 7 days ?PT Duration Estimated Length of Stay: 18-21 days ?OT Intensity: Minimum of 1-2 x/day, 45 to 90 minutes ?OT Frequency: 5 out of 7 days ?OT Duration/Estimated Length of Stay: 18-21 days ?SLP Intensity: Minumum of 1-2 x/day, 30 to 90 minutes ?SLP Frequency: 3 to 5 out of 7 days ?SLP Duration/Estimated Length of Stay: 2.5-3 weeks  ? ?Due to the current state of emergency, patients may not be receiving their 3-hours of Medicare-mandated therapy. ? ? Team Interventions: ?Nursing  Interventions Bladder Management, Disease Management/Prevention, Medication Management, Discharge Planning, Bowel Management, Patient/Family Education  ?PT interventions Ambulation/gait training, Discharge planning, Functional mobility training, Psychosocial support, Therapeutic Activities, Visual/perceptual remediation/compensation, Balance/vestibular training, Skin care/wound management, Disease management/prevention, Neuromuscular re-education, Therapeutic Exercise, Wheelchair propulsion/positioning, Cognitive remediation/compensation, DME/adaptive equipment instruction, Pain management, Splinting/orthotics, UE/LE Strength taining/ROM, Community reintegration, Functional electrical stimulation, Patient/family education, Stair training, UE/LE Coordination activities  ?OT Interventions Balance/vestibular training, Disease mangement/prevention, Neuromuscular re-education, Self Care/advanced ADL retraining, Therapeutic Exercise, Wheelchair propulsion/positioning, UE/LE Strength taining/ROM, Skin care/wound managment, Pain management, DME/adaptive equipment instruction, Cognitive remediation/compensation, Community reintegration, Functional electrical stimulation, Patient/family education, Splinting/orthotics, UE/LE Coordination activities, Visual/perceptual remediation/compensation, Therapeutic Activities, Psychosocial support, Functional mobility training, Discharge planning  ?SLP Interventions Cognitive remediation/compensation, Internal/external aids, Speech/Language facilitation, Cueing hierarchy, Dysphagia/aspiration precaution training, Functional tasks, Patient/family education, Therapeutic Activities  ?TR Interventions    ?SW/CM Interventions Discharge Planning, Psychosocial Support, Patient/Family Education, Disease Management/Prevention  ? ?Barriers to Discharge ?MD  Medical stability and knee pain  ?Nursing Decreased caregiver support, Home environment access/layout ?level 2 ste left rail w spouse; dtr to  assist  ?PT Inaccessible home environment, Home environment access/layout, Incontinence, Behavior ?   ?OT Inaccessible home environment, Home environment access/layout, Decreased caregiver support, Insurance for SNF coverage ?   ?SLP   ?   ?SW Insurance for SNF coverage ?   ? ?Team Discharge Planning: ?Destination: PT-Home ,OT- Home , SLP-Home ?Projected Follow-up: PT-Home health PT, OT-  Outpatient OT, 24 hour supervision/assistance, SLP-Home Health SLP, 24 hour supervision/assistance ?Projected Equipment Needs:  PT-Rolling walker with 5" wheels, Wheelchair (measurements), Wheelchair cushion (measurements), OT- To be determined, SLP-To be determined ?Equipment Details: PT- , OT-  ?Patient/family involved in discharge planning: PT- Patient,  OT-Patient, SLP-Patient ? ?MD ELOS: 14-20d ?Medical Rehab Prognosis:  Good ?Assessment: The patient has been admitted for CIR therapies with the diagnosis of RIght IC infarct. The team will be addressing functional mobility, strength, stamina, balance, safety, adaptive techniques and equipment, self-care, bowel and bladder mgt, patient and caregiver education, Chronic R knee pain. Goals have been set at CGA/Sup. Anticipated discharge destination is Home. ? ?Due to the current state of emergency, patients may not be receiving their 3 hours per day of Medicare-mandated therapy.  ?  ? ? ?See Team Conference Notes for weekly updates to the plan of care  ?

## 2021-11-22 NOTE — Progress Notes (Signed)
Physical Therapy Session Note ? ?Patient Details  ?Name: Elizabeth [name] ?MRN: [id number] ?Date of Birth: Nov 18, 1940 ? ?Today's Date: 11/22/2021 ?PT Individual Time: 1415-1500 ?PT Individual Time Calculation (min): 45 min  ? ?Short Term Goals: ?Week 1:  PT Short Term Goal 1 (Week 1): Pt will transfer to [facility] with min assist and LRAD ?PT Short Term Goal 2 (Week 1): Pt will ambulate with RW x 79f with mod assist ?PT Short Term Goal 3 (Week 1): Pt will propell WC 1070fwith min assist ? ?Skilled Therapeutic Interventions/Progress Updates:  ?Patient supine in bed on entrance to room. Patient alert and agreeable to PT session.  ? ?Patient with no pain complaint throughout session. ? ?Therapeutic Activity: ?Bed Mobility: Patient performed supine <> sit with CGA. VC/ tc required for impulsivity and to remain seated at EOB. ?Transfers: Patient performed sit<>stand and stand pivot transfers throughout session with CGA and improving to supervision with NMR. Provided verbal cues for technique. ? ?Gait Training:  ?Patient ambulated 1142x1/ 8578x1 using RW with L hand saddle splint with up to modA and +2 for w/c follow. Provided continuous multimodal cueing for improved LLE stance and swing phases in gait. [facility] advancement  and clearing of L foot with step through pattern. Is noted to drag/ slide foot with decreased step length.  ? ?Neuromuscular Re-ed: ?NMR facilitated during session with focus on standing balance, muscle recruitment. Pt guided in sit<.stand performance and progressed to performance with no UE support to no AD with supervision. Pt also guided in use of RW for BUE support and focus on LLE motor recruitment and improved performance in toe touches to 4" step and removal of foot without dragging on step. Switched to SLS on LLE in order to improve functional knee extension in stance. NMR performed for improvements in motor control and coordination, balance, sequencing, judgement, and  self confidence/ efficacy in performing all aspects of mobility at highest level of independence.  ? ?Patient supine  in bed at end of session with brakes locked, bed alarm set, and all needs within reach. ? ?Therapy Documentation ?Precautions:  ?Precautions ?Precautions: Fall ?Precaution Comments: L hemiparesis ?Restrictions ?Weight Bearing Restrictions: No ?General: ?  ?Pain: ? No pain complaint this session.  ? ?Therapy/Group: Individual Therapy ? ?JuAlger SimonsT, DPT ?11/22/2021, 5:15 PM  ?

## 2021-11-22 NOTE — Progress Notes (Signed)
S: Awake and alert lying bed with cell phone on belly. Denies pain. Says she is "trying" to eat her meals. Total meal consumption variable. Denies pain .  + LUE weakness persists. ?Consistent min A to maintain EOB, light max A total A to transfer to [facility]. ? ?O: VSS. Afebrile ?Alert and oriented.  Follows commands.  ?Can only lift left forearm against gravity. ?CBG (last 3)  ?Recent Labs  ?  11/21/21 ?2111 11/22/21 ?0656 11/22/21 ?1143  ?GLUCAP 121* 127* 99  ?  ? ?A: s/p lacunar infarct right internal capsule ? ?P: Continue current treatment plan. ? ?Barbie Banner, PA-C  ?

## 2021-11-22 NOTE — Progress Notes (Signed)
Physical Therapy Session Note ? ?Patient Details  ?Name: Elizabeth [name] ?MRN: [id number] ?Date of Birth: 1941-05-02 ? ?Today's Date: 11/22/2021 ?PT Individual Time: 2481-4439 ?PT Individual Time Calculation (min): 23 min  ? ?Short Term Goals: ?Week 1:  PT Short Term Goal 1 (Week 1): Pt will transfer to [facility] with min assist and LRAD ?PT Short Term Goal 2 (Week 1): Pt will ambulate with RW x 3f with mod assist ?PT Short Term Goal 3 (Week 1): Pt will propell WC 1030fwith min assist ? ?Skilled Therapeutic Interventions/Progress Updates:  ? ?Pt received sitting in WC and agreeable to PT. Pt performed pregait training with BUE supported on RW to step over 2inch bar x 8 BLE forward and x 6 bil laterally with min-mod assist for safety and midline awareness. Gait training with RW 2 x 2521fith mod assist and cues for midline and step length. Patient returned to room and [facility] and all needs met.   ? ? ?   ? ?Therapy Documentation ?Precautions:  ?Precautions ?Precautions: Fall ?Precaution Comments: L hemiparesis ?Restrictions ?Weight Bearing Restrictions: No ?General: ? Pain: denies ? ?Therapy/Group: Individual Therapy ? ?AusLorie Phenix/17/2023, 8:33 AM  ?

## 2021-11-22 NOTE — Progress Notes (Signed)
Speech Language Pathology Daily Session Note ? ?Patient Details  ?Name: Elizabeth [name] ?MRN: [id number] ?Date of Birth: [birth year] ? ?Today's Date: 11/22/2021 ?SLP Individual Time: 0905-1000 ?SLP Individual Time Calculation (min): 55 min ? ?Short Term Goals: ?Week 1: SLP Short Term Goal 1 (Week 1): Patient will consume current diet without overt s/sx of aspiration with min A verbal cues for use of swallowing compensatory strategies ?SLP Short Term Goal 2 (Week 1): Patient will consume ice chips/thin liquid trials with min overt s/sx of aspiration to indicate readiness for MBS. ?SLP Short Term Goal 3 (Week 1): Patient will utilize speech intelligiblity strategies at the word and phrase level with min-to-mod A cues to achieve 75%+ intelligiblity ?SLP Short Term Goal 4 (Week 1): Patient will complete mildly complex problem solving tasks with min-to-mod A verbal/visual cues ?SLP Short Term Goal 5 (Week 1): Patient will demonstrate awareness to errors during functional and novel tasks with min-to-mod A verbal/visual cues ?SLP Short Term Goal 6 (Week 1): Patient will recall and implement up to 2 beneficial compensatory memory strategies to recall new, daily information with min-to-mod A verbal and question cues ? ?Skilled Therapeutic Interventions: Skilled ST treatment focused on swallowing and speech goals. SLP facilitated session by providing mod A verbal cues for utilizing speech intelligibility strategies at the word and phrase level to achieve 50% intelligibility in unknown contexts, and progressing to 75% intelligible within known contexts. Patient completed abstract reasoning using Scattergories cards to generate words within categories based on initial letter. Patient sustained attention to task for duration of session with sup A verbal redirection. Patient consumed~4oz of nectar thick liquids by straw with min A verbal cues to take small, single sips. Pt exhibited immediate cough response x1. Pt exhibited productive  cough and was able to orally expel majority of secretions into napkin and then utilized suction to clear trace secretions from oral cavity. Patient's spouse was present at end of session. SLP provided education on safe swallowing precautions and strategies with PO intake. Spouse verbalized understanding through teach back. Patient was left in wheelchair with alarm activated and immediate needs within reach at end of session. Continue per current plan of care.   ?   ?Pain ?Pain Assessment ?Pain Scale: 0-10 ?Pain Score: 0-No pain ? ?Therapy/Group: Individual Therapy ? ?Elizabeth [name] T Ernestene Coover ?11/22/2021, 10:01 AM ?

## 2021-11-22 NOTE — Progress Notes (Signed)
Occupational Therapy Session Note ? ?Patient Details  ?Name: Elizabeth [name] ?MRN: [id number] ?Date of Birth: 1941-06-17 ? ?Today's Date: 11/22/2021 ?OT Individual Time: 0705-0800 ?OT Individual Time Calculation (min): 55 min  ? ? ?Short Term Goals: ?Week 1:  OT Short Term Goal 1 (Week 1): Pt will maintain balance seated EOB > 8 min with no more than CGA in prep for seated ADL. ?OT Short Term Goal 2 (Week 1): Pt will complete toilet transfer with max A and LRAD. ?OT Short Term Goal 3 (Week 1): Pt will complete LBD with max A and LRAD. ?OT Short Term Goal 4 (Week 1): Pt will don shirt with mod A. ? ?Skilled Therapeutic Interventions/Progress Updates:  ?  Pt received awake in bed, reports BUE pain 5/10 but declines intervention at this time/reports primary hurts over night, agreeable to therapy. Session focus on self-care retraining, activity tolerance, LUE NMR, transfer retraining in prep for improved ADL/IADL/func mobility performance + decreased caregiver burden. Came to sitting EOB with light mod A to progress hemiparetic body and to lift trunk. L lean in sitting requiring consistent min A to prevent LOB. Stand-pivot with mod A to facilitate WS on BLE to w/c on her R. Donned pants with max A to thread BLE and to pull up pants over L hip/back, light mod A to come into fully upright posture with therapist in front. Doffed shirt with mod A to remove over head and remove RUE, donned new shirt with max A to thread BUE/pull down in back. Much improved ability to scoot back in w/c this day with moderate physical assist.  ? ?Completed grooming tasks at sink with hand-over-hand assist to incorporate LUE as stabilizer. Additionally, self-fed ~75% of breakfast using RUE, hand-over-hand assist to incorproate LUE to stabilize food items and tray rearranged to force attention/gaze to L. Cues for swallowing strategies per SLP swallowing sheet. ? ?Pt left seated in w/c with safety belt alarm engaged, call bell in reach, and all  immediate needs met.  ? ? ?Therapy Documentation ?Precautions:  ?Precautions ?Precautions: Fall ?Precaution Comments: L hemiparesis ?Restrictions ?Weight Bearing Restrictions: No ? ? ?Pain: see session note ?  ?ADL: See Care Tool for more details. ? ? ?Therapy/Group: Individual Therapy ? ?Volanda Napoleon MS, OTR/L ? ?11/22/2021, 6:59 AM ?

## 2021-11-23 DIAGNOSIS — I6381 Other cerebral infarction due to occlusion or stenosis of small artery: Secondary | ICD-10-CM | POA: Diagnosis not present

## 2021-11-23 LAB — GLUCOSE, CAPILLARY
Glucose-Capillary: 122 mg/dL — ABNORMAL HIGH (ref 70–99)
Glucose-Capillary: 125 mg/dL — ABNORMAL HIGH (ref 70–99)
Glucose-Capillary: 114 mg/dL — ABNORMAL HIGH (ref 70–99)
Glucose-Capillary: 114 mg/dL — ABNORMAL HIGH (ref 70–99)

## 2021-11-23 NOTE — Progress Notes (Signed)
Speech Language Pathology Daily Session Note ? ?Patient Details  ?Name: Elizabeth [name] ?MRN: [id number] ?Date of Birth: [birth year] ? ?Today's Date: 11/23/2021 ?SLP Individual Time: 5465-6812 ?SLP Individual Time Calculation (min): 43 min ? ?Short Term Goals: ?Week 1: SLP Short Term Goal 1 (Week 1): Patient will consume current diet without overt s/sx of aspiration with min A verbal cues for use of swallowing compensatory strategies ?SLP Short Term Goal 2 (Week 1): Patient will consume ice chips/thin liquid trials with min overt s/sx of aspiration to indicate readiness for MBS. ?SLP Short Term Goal 3 (Week 1): Patient will utilize speech intelligiblity strategies at the word and phrase level with min-to-mod A cues to achieve 75%+ intelligiblity ?SLP Short Term Goal 4 (Week 1): Patient will complete mildly complex problem solving tasks with min-to-mod A verbal/visual cues ?SLP Short Term Goal 5 (Week 1): Patient will demonstrate awareness to errors during functional and novel tasks with min-to-mod A verbal/visual cues ?SLP Short Term Goal 6 (Week 1): Patient will recall and implement up to 2 beneficial compensatory memory strategies to recall new, daily information with min-to-mod A verbal and question cues ? ?Skilled Therapeutic Interventions: ?Pt seen for skilled ST with focus on speech and swallow goals, pt daughter and grandson present throughout. SLP raising pt HOB for PO trials of ice chips and thin liquid via tsp. Pt with overt s/s aspiration on 80% of trials of ice and thin via tsp c/b immediate coughing/strangling. Pt often utilizing suction to clear secretions/residue, trails eventually halted due to apparent overt aspiration of trials. Discussed with patient/family basic anatomy/physiology of swallow structures, s/s aspiration and recommendations to remain Dys1/NTL at this time, all in agreement. Pt independently able to locate "SLOP" speech intelligibility sign hanging in room, benefits from min A cues to  utilize these strategies during simple conversation. Speech averaged ~75% intelligible with unknown topic. Pt left in bed with alarm set and family present for needs, Cont ST POC.  ? ?Pain ?Pain Assessment ?Pain Scale: 0-10 ?Pain Score: 0-No pain ? ?Therapy/Group: Individual Therapy ? ?Dewaine Conger ?11/23/2021, 2:30 PM ?

## 2021-11-23 NOTE — Progress Notes (Signed)
Physical Therapy Session Note ? ?Patient Details  ?Name: Elizabeth [name] ?MRN: [id number] ?Date of Birth: 1941-06-21 ? ?Today's Date: 11/23/2021 ?PT Individual Time: 6568-1275 ?PT Individual Time Calculation (min): 25 min  ? ?Short Term Goals: ?Week 1:  PT Short Term Goal 1 (Week 1): Pt will transfer to [facility] with min assist and LRAD ?PT Short Term Goal 2 (Week 1): Pt will ambulate with RW x 72f with mod assist ?PT Short Term Goal 3 (Week 1): Pt will propell WC 1090fwith min assist ? ?Skilled Therapeutic Interventions/Progress Updates:  ? ?Pt received supine in bed and agreeable to PT. Supine>sit transfer with mod assist and max cues for sequencing and improved use of Lside trunk and UE. Gait training through room 2 x 1225fo [facility] assist and max cues for attention to task and awareness of the LLE. Max cues for safety when preparing to sit on toilet as pt trying to wipe mouth with toilet paper wprior to sitting. Near LOB to the requiring max assist to prevent fall. Pt unable to void bladder sitting on toilet. Once EOB pt performed side stepping R and L x 3 each with max cues for attention to task and improved midline to prevent LOB to the L. Sit>supine completed with mod assist at BLE, and left supine in bed with call bell in reach and all needs met.  ? ? ?   ? ?Therapy Documentation ?Precautions:  ?Precautions ?Precautions: Fall ?Precaution Comments: L hemiparesis ?Restrictions ?Weight Bearing Restrictions: No ? ?  ?Vital Signs: ?Therapy Vitals ?Temp: 98.5 ?F (36.9 ?C) ?Temp Source: Oral ?Pulse Rate: 68 ?Resp: 16 ?BP: (!) 132/55 ?Patient Position (if appropriate): Lying ?Oxygen Therapy ?SpO2: 97 % ?O2 Device: Room Air ?Pain: ?Pain Assessment ?Pain Scale: 0-10 ?Pain Score: 0-No pain ? ? ? ? ?Therapy/Group: Individual Therapy ? ?AusLorie Phenix/18/2023, 4:29 PM  ?

## 2021-11-23 NOTE — Progress Notes (Signed)
?                                                       PROGRESS NOTE ? ? ?Subjective/Complaints: ? ?Pt reports R side pain - a little bit- on toilet- trying to void.  ? ?LBM 2 days ago when had laxative- denies constipation now.  ? ?ROS-  ?Pt denies SOB, abd pain, CP, N/V/C/D, and vision changes ? ?Objective: ?  ?No results found. ?No results for input(s): WBC, HGB, HCT, PLT in the last 72 hours. ?No results for input(s): NA, K, CL, CO2, GLUCOSE, BUN, CREATININE, CALCIUM in the last 72 hours. ? ?Intake/Output Summary (Last 24 hours) at 11/23/2021 1027 ?Last data filed at 11/23/2021 0753 ?Gross per 24 hour  ?Intake 240 ml  ?Output --  ?Net 240 ml  ?  ? ?  ? ?Physical Exam: ?Vital Signs ?Blood pressure (!) 144/66, pulse 61, temperature 98.2 ?F (36.8 ?C), temperature source Oral, resp. rate 14, height '5\' 2"'$  (1.575 m), weight 63.4 kg, SpO2 98 %. ? ? ? ? ?General: awake, alert, appropriate, on toilet qwith NT in room; NAD ?HENT: conjugate gaze; oropharynx moist ?CV: regular rate; no JVD ?Pulmonary: CTA B/L; no W/R/R- good air movement ?GI: soft, NT, ND, (+)BS ?Psychiatric: appropriate ?Neurological: alert ? ?Extremities: No clubbing, cyanosis, or edema ?Skin: No evidence of breakdown, no evidence of rash ?Neurologic: Cranial nerves II through XII intact, motor strength is 5/5 in bilateral deltoid, bicep, tricep, grip, hip flexor, knee extensors, ankle dorsiflexor and plantar flexor ?Sensory exam normal sensation to light touch and proprioception in bilateral upper and lower extremities ?Cerebellar exam normal finger to nose to finger as well as heel to shin in bilateral upper and lower extremities ?Musculoskeletal: Full range of motion in all 4 extremities. No joint swelling ? ? ? ?Assessment/Plan: ?1. Functional deficits which require 3+ hours per day of interdisciplinary therapy in a comprehensive inpatient rehab setting. ?Physiatrist is providing close team supervision and 24 hour management of active medical problems  listed below. ?Physiatrist and rehab team continue to assess barriers to discharge/monitor patient progress toward functional and medical goals ? ?Care Tool: ? ?Bathing ?   ?Body parts bathed by patient: Left arm, Chest, Abdomen, Right upper leg, Left upper leg, Right lower leg, Left lower leg, Face  ? Body parts bathed by helper: Right arm ?  ?  ?Bathing assist Assist Level: Moderate Assistance - Patient 50 - 74% ?  ?  ?Upper Body Dressing/Undressing ?Upper body dressing   ?What is the patient wearing?: Pull over shirt ?   ?Upper body assist Assist Level: Maximal Assistance - Patient 25 - 49% ?   ?Lower Body Dressing/Undressing ?Lower body dressing ? ? ?   ?What is the patient wearing?: Underwear/pull up ? ?  ? ?Lower body assist Assist for lower body dressing: Moderate Assistance - Patient 50 - 74% ?   ? ?Toileting ?Toileting Toileting Activity did not occur (Probation officer and hygiene only): N/A (no void or bm)  ?Toileting assist Assist for toileting: Moderate Assistance - Patient 50 - 74% [place] will yurn to be changed) ?  ?  ?Transfers ?Chair/bed transfer ? ?Transfers assist ?   ? ?Chair/bed transfer assist level: Maximal Assistance - Patient 25 - 49% ?  ?  ?Locomotion ?Ambulation ? ? ?Ambulation assist ? ?  Ambulation activity did not occur: Safety/medical concerns ? ?  ?  ?   ? ?Walk 10 feet activity ? ? ?Assist ? Walk 10 feet activity did not occur: Safety/medical concerns ? ?  ?   ? ?Walk 50 feet activity ? ? ?Assist Walk 50 feet with 2 turns activity did not occur: Safety/medical concerns ? ?  ?   ? ? ?Walk 150 feet activity ? ? ?Assist Walk 150 feet activity did not occur: Safety/medical concerns ? ?  ?  ?  ? ?Walk 10 feet on uneven surface  ?activity ? ? ?Assist Walk 10 feet on uneven surfaces activity did not occur: Safety/medical concerns ? ? ?  ?   ? ?Wheelchair ? ? ? ? ?Assist Is the patient using a wheelchair?: Yes ?Type of Wheelchair: Manual ?  ? ?Wheelchair assist level: Dependent - Patient  0% ?Max wheelchair distance: 150  ? ? ?Wheelchair 50 feet with 2 turns activity ? ? ? ?Assist ? ?  ?  ? ? ?Assist Level: Dependent - Patient 0%  ? ?Wheelchair 150 feet activity  ? ? ? ?Assist ?   ? ? ?Assist Level: Dependent - Patient 0%  ? ?Blood pressure (!) 144/66, pulse 61, temperature 98.2 ?F (36.8 ?C), temperature source Oral, resp. rate 14, height '5\' 2"'$  (1.575 m), weight 63.4 kg, SpO2 98 %. ? ?Medical Problem List and Plan: ?1. 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            -patient may shower ?            -ELOS/Goals: 14-20 days, min assist to supervision goals, therapy evals today ? Con't CIR- PT, OT and SLP ?2.  Antithrombotics: ?-DVT/anticoagulation:  Pharmaceutical: Lovenox ?            -antiplatelet therapy: Plavix and aspirin ?3. Pain Management: Tylenol ?4. Mood: LCSW to evaluate and provide emotional support ?            -antipsychotic agents: Not applicable ?5. Neuropsych: This patient is capable of making decisions on her own behalf. ?6. Skin/Wound Care: Routine skin care checks ?7. Fluids/Electrolytes/Nutrition: Routine I's and O's and follow-up chemistries ?Dysphagia , MBS performed at OSH ?            -- Pur?ed level 4 diet, thickened liquids. ?            -- Crushed meds in applesauce ?            -- 1 to 1 supervision with meals ?            -- SLP eval on admission ?8: DM-2: carb modified diet ?--CBGs and SSI (no home meds) ?CBG (last 3)  ?Recent Labs  ?  11/22/21 ?1638 11/22/21 ?2006 11/23/21 ?0604  ?GLUCAP 122* 140* 122*  ? 3/18- Cbgs controlled- con't regimen ? ?9: Seasonal allergies: Continue symptomatic relief as needed ?10: GERD: Continue Protonix ?11: COPD: Continue albuterol nebs as needed (no home meds)- smoking hx  ?            --no supplemental O2 needs ?12: Hypokalemia: resolved with supplementation>>>continue and follow-up BMP ?BMP Latest Ref Rng & Units 11/20/2021  08/26/2021 06/10/2021  ?Glucose 70 - 99 mg/dL 111(H) 179(H) -  ?BUN 8 - 23 mg/dL 20 11 -  ?Creatinine 0.44 - 1.00 mg/dL 0.69 0.78 0.80  ?BUN/Creat Ratio 12 - 28 - 14 -  ?  Sodium 135 - 145 mmol/L 133(L) 135 -  ?Potassium 3.5 - 5.1 mmol/L 4.0 3.6 -  ?Chloride 98 - 111 mmol/L 99 99 -  ?CO2 22 - 32 mmol/L 24 21 -  ?Calcium 8.9 - 10.3 mg/dL 9.6 9.9 -  ? ? ?13: Hyperlipidemia: intolerant of all statins (no other alternatives listed on home meds) ?14: Hypertension: Controlled off home amlodipine ?             ?Vitals:  ? 11/22/21 1931 11/23/21 0423  ?BP: (!) 152/60 (!) 144/66  ?Pulse: 66 61  ?Resp: 18 14  ?Temp: 98.1 ?F (36.7 ?C) 98.2 ?F (36.8 ?C)  ?SpO2: 98% 98%  ?  3/18- BP slightly elevated- will monitor for trend.  ?15: Chronic right knee pain: will monitor how this affects therapy , no NSAIDs due to CVA , may use tylenol  ?  3/18- denies pain this Am so far. -might be able to do voltaren gel if needed ? ?LOS: ?4 days ?A FACE TO FACE EVALUATION WAS PERFORMED ? ?Armonte Tortorella ?11/23/2021, 10:27 AM  ? ? ? ?

## 2021-11-23 NOTE — Progress Notes (Signed)
Occupational Therapy Session Note ? ?Patient Details  ?Name: Elizabeth [name] ?MRN: [id number] ?Date of Birth: 02-24-1941 ? ?Today's Date: 11/23/2021 ?OT Individual Time: [id number] ?OT Individual Time Calculation (min): 53 min  ? ? ?Short Term Goals: ?Week 1:  OT Short Term Goal 1 (Week 1): Pt will maintain balance seated EOB > 8 min with no more than CGA in prep for seated ADL. ?OT Short Term Goal 2 (Week 1): Pt will complete toilet transfer with max A and LRAD. ?OT Short Term Goal 3 (Week 1): Pt will complete LBD with max A and LRAD. ?OT Short Term Goal 4 (Week 1): Pt will don shirt with mod A. ? ?Skilled Therapeutic Interventions/Progress Updates:  ?  Pt received semi-reclined in bed, reports mild R abdominal pain and MD later present/aware and LPN notified, agreeable to therapy. Session focus on self-care retraining, activity tolerance, transfer retraining in prep for improved ADL/IADL/func mobility performance + decreased caregiver burden. Came to sitting EOB with light mod A to progress LLE off bed and to lift trunk L lean in sitting requiring CGA to consistent min A to prevent LOB. Ambulated > toilet with mod A to progress LLE and +2 present for w/c follow. Difficulty sequencing turn and RW management into toilet. Max A for LB clothing management, min A for standing balance during pericare post continent void of bladder. Stedy transfer > TTB with overall min A.  ? ?Consistent min to mod A for static sitting balance on TTB and poor ability to self-correct. Bathed UB with min A, LB mod A with use of LH sponge. Assist to manage water/shower materials. Stedy transfer similar manner > w/c.  ? ?Donned sweatshirt with mod A to thread LUE and to pull down in back/for balance. Donned brief/pants with mod A to thread LLE and to pull up over L hip. Min A for sit to stand at sink to block L knee. Stand-pivot with min A to her L to recliner. ? ?Pt left seated in recliner with safety belt alarm engaged, call bell in reach, and all  immediate needs met.  ? ? ?Therapy Documentation ?Precautions:  ?Precautions ?Precautions: Fall ?Precaution Comments: L hemiparesis ?Restrictions ?Weight Bearing Restrictions: No ? ?Pain: ?  See session note ?ADL: See Care Tool for more details. ? ? ? ?Therapy/Group: Individual Therapy ? ?Volanda Napoleon MS, OTR/L ? ?11/23/2021, 6:47 AM ?

## 2021-11-23 NOTE — Progress Notes (Signed)
Physical Therapy Session Note ? ?Patient Details  ?Name: Elizabeth [name] ?MRN: [id number] ?Date of Birth: 1941-07-30 ? ?Today's Date: 11/23/2021 ?PT Individual Time: 5809-9833 ?PT Individual Time Calculation (min): 54 min  ? ?Short Term Goals: ?Week 1:  PT Short Term Goal 1 (Week 1): Pt will transfer to [facility] with min assist and LRAD ?PT Short Term Goal 2 (Week 1): Pt will ambulate with RW x 45f with mod assist ?PT Short Term Goal 3 (Week 1): Pt will propell WC 1040fwith min assist ? ?Skilled Therapeutic Interventions/Progress Updates:  ? ?Pt received sitting in recliner and agreeable to PT. [facility] and Lhand splint for UE stability.  ? ?[facility] through rehab gym x 404fnd [id number] min-mod assist due to increasing L lateral lean, and decreased step length on the LLE with fatigue. Cues for improved heel contact and midline awareness intermittently to reduce fall risk.  ? ?Dynamic standing balance to obtain and toss bean bag to target from the R side to Force improved R sided weight shift and midline awareness 3 x 10 LUE supported iON RW through with hand splint.  ?Seated trunkal NMR to performed ball tap with 2# bar weight 3 x 1 min with min assist and moderate cues for improve neutral position for postural control. Therapy ball press from sitting in WC forward and R/L x 5 each with min assist from PT to stabilize the LUE. Cross body reaches with the LUE to grab bean bags while sitting EOB x 10.  ? ?Pt returned to room and performed ambulatory transfer to bed with RW and min assist for safety. Sit>supine completed with mod assist due to poor motor planning when returning to supine, and left supine in bed with call bell in reach and all needs met.  ? ?   ? ?Therapy Documentation ?Precautions:  ?Precautions ?Precautions: Fall ?Precaution Comments: L hemiparesis ?Restrictions ?Weight Bearing Restrictions: No ? ?Pain: ?   denies ? ? ? ? ?Therapy/Group: Individual Therapy ? ?AustLorie Phenix18/2023, 11:06 AM  ?

## 2021-11-24 LAB — GLUCOSE, CAPILLARY
Glucose-Capillary: 107 mg/dL — ABNORMAL HIGH (ref 70–99)
Glucose-Capillary: 125 mg/dL — ABNORMAL HIGH (ref 70–99)
Glucose-Capillary: 126 mg/dL — ABNORMAL HIGH (ref 70–99)
Glucose-Capillary: 114 mg/dL — ABNORMAL HIGH (ref 70–99)

## 2021-11-25 DIAGNOSIS — I6381 Other cerebral infarction due to occlusion or stenosis of small artery: Secondary | ICD-10-CM | POA: Diagnosis not present

## 2021-11-25 LAB — GLUCOSE, CAPILLARY
Glucose-Capillary: 114 mg/dL — ABNORMAL HIGH (ref 70–99)
Glucose-Capillary: 145 mg/dL — ABNORMAL HIGH (ref 70–99)
Glucose-Capillary: 108 mg/dL — ABNORMAL HIGH (ref 70–99)
Glucose-Capillary: 134 mg/dL — ABNORMAL HIGH (ref 70–99)

## 2021-11-25 MED ORDER — AMLODIPINE BESYLATE 5 MG PO TABS
5.0000 mg | ORAL_TABLET | Freq: Every day | ORAL | Status: DC
  Administered 2021-11-25 – 2021-12-03 (×9): 5 mg via ORAL
  Filled 2021-11-25 (×9): qty 1

## 2021-11-25 NOTE — Progress Notes (Addendum)
?                                                       PROGRESS NOTE ? ? ?Subjective/Complaints: ? ?Discussed BP, resumption of amlodipine at '5mg'$  , states she took '10mg'$  at home ?Also has chronic Left sided weakness from prior CVA ? ?ROS- neg CP, SOB, N/V/D ?Objective: ?  ?No results found. ?No results for input(s): WBC, HGB, HCT, PLT in the last 72 hours. ? ?No results for input(s): NA, K, CL, CO2, GLUCOSE, BUN, CREATININE, CALCIUM in the last 72 hours. ? ? ?Intake/Output Summary (Last 24 hours) at 11/25/2021 1219 ?Last data filed at 11/25/2021 8119 ?Gross per 24 hour  ?Intake 440 ml  ?Output --  ?Net 440 ml  ? ?  ? ?  ? ?Physical Exam: ?Vital Signs ?Blood pressure (!) 156/57, pulse 63, temperature (!) 97.5 ?F (36.4 ?C), temperature source Oral, resp. rate 14, height '5\' 2"'$  (1.575 m), weight 63.4 kg, SpO2 93 %. ? ?General: No acute distress ?Mood and affect are appropriate ?Heart: Regular rate and rhythm no rubs murmurs or extra sounds ?Lungs: Clear to auscultation, breathing unlabored, no rales or wheezes ?Abdomen: Positive bowel sounds, soft nontender to palpation, nondistended ?Extremities: No clubbing, cyanosis, or edema ?Skin: No evidence of breakdown, no evidence of rash ?Neurologic: Cranial nerves II through XII intact, motor strength is 5/5 in RIght and 4/5 left deltoid, bicep, tricep, grip, hip flexor, knee extensors, ankle dorsiflexor and plantar flexor ?Sensory exam normal sensation to light touch and proprioception in bilateral upper and lower extremities ?Cerebellar exam normal finger to nose to finger as well as heel to shin in bilateral upper and lower extremities ?Musculoskeletal: Full range of motion in all 4 extremities. No joint swelling ? ? ? ?Assessment/Plan: ?1. Functional deficits which require 3+ hours per day of interdisciplinary therapy in a comprehensive inpatient rehab setting. ?Physiatrist is providing close team supervision and 24 hour management of active medical problems listed  below. ?Physiatrist and rehab team continue to assess barriers to discharge/monitor patient progress toward functional and medical goals ? ?Care Tool: ? ?Bathing ?   ?Body parts bathed by patient: Left arm, Chest, Abdomen, Right upper leg, Left upper leg, Right lower leg, Left lower leg, Face  ? Body parts bathed by helper: Right arm ?  ?  ?Bathing assist Assist Level: Moderate Assistance - Patient 50 - 74% ?  ?  ?Upper Body Dressing/Undressing ?Upper body dressing   ?What is the patient wearing?: Pull over shirt ?   ?Upper body assist Assist Level: Maximal Assistance - Patient 25 - 49% ?   ?Lower Body Dressing/Undressing ?Lower body dressing ? ? ?   ?What is the patient wearing?: Underwear/pull up ? ?  ? ?Lower body assist Assist for lower body dressing: Moderate Assistance - Patient 50 - 74% ?   ? ?Toileting ?Toileting Toileting Activity did not occur (Probation officer and hygiene only): N/A (no void or bm)  ?Toileting assist Assist for toileting: Moderate Assistance - Patient 50 - 74% [place] will yurn to be changed) ?  ?  ?Transfers ?Chair/bed transfer ? ?Transfers assist ?   ? ?Chair/bed transfer assist level: Maximal Assistance - Patient 25 - 49% ?  ?  ?Locomotion ?Ambulation ? ? ?Ambulation assist ? ? Ambulation activity did not occur: Safety/medical concerns ? ?  ?  ?   ? ?  Walk 10 feet activity ? ? ?Assist ? Walk 10 feet activity did not occur: Safety/medical concerns ? ?  ?   ? ?Walk 50 feet activity ? ? ?Assist Walk 50 feet with 2 turns activity did not occur: Safety/medical concerns ? ?  ?   ? ? ?Walk 150 feet activity ? ? ?Assist Walk 150 feet activity did not occur: Safety/medical concerns ? ?  ?  ?  ? ?Walk 10 feet on uneven surface  ?activity ? ? ?Assist Walk 10 feet on uneven surfaces activity did not occur: Safety/medical concerns ? ? ?  ?   ? ?Wheelchair ? ? ? ? ?Assist Is the patient using a wheelchair?: Yes ?Type of Wheelchair: Manual ?  ? ?Wheelchair assist level: Dependent - Patient 0% ?Max  wheelchair distance: 150  ? ? ?Wheelchair 50 feet with 2 turns activity ? ? ? ?Assist ? ?  ?  ? ? ?Assist Level: Dependent - Patient 0%  ? ?Wheelchair 150 feet activity  ? ? ? ?Assist ?   ? ? ?Assist Level: Dependent - Patient 0%  ? ?Blood pressure (!) 156/57, pulse 63, temperature (!) 97.5 ?F (36.4 ?C), temperature source Oral, resp. rate 14, height '5\' 2"'$  (1.575 m), weight 63.4 kg, SpO2 93 %. ? ?Medical Problem List and Plan: ?1. 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            -patient may shower ?            -ELOS/Goals: 14-20 days, min assist to supervision goals, therapy evals today  ?2.  Antithrombotics: ?-DVT/anticoagulation:  Pharmaceutical: Lovenox ?            -antiplatelet therapy: Plavix and aspirin ?3. Pain Management: Tylenol ?4. Mood: LCSW to evaluate and provide emotional support ?            -antipsychotic agents: Not applicable ?5. Neuropsych: This patient is capable of making decisions on her own behalf. ?6. Skin/Wound Care: Routine skin care checks ?7. Fluids/Electrolytes/Nutrition: Routine I's and O's and follow-up chemistries ?Dysphagia , MBS performed at OSH ?            -- Pur?ed level 4 diet, thickened liquids. ?            -- Crushed meds in applesauce ?            -- 1 to 1 supervision with meals ?            -- SLP eval on admission ?8: DM-2: carb modified diet ?--CBGs and SSI (no home meds) ?CBG (last 3)  ?Recent Labs  ?  11/24/21 ?2144 11/25/21 ?0627 11/25/21 ?1159  ?GLUCAP 114* 114* 145*  ? ?Controlled 3/20- rarely receiving SSI ? ?9: Seasonal allergies: Continue symptomatic relief as needed ?10: GERD: Continue Protonix ?11: COPD: Continue albuterol nebs as needed (no home meds)- smoking hx  ?            --no supplemental O2 needs ?12: Hypokalemia: resolved with supplementation>>>continue and follow-up BMP ?BMP Latest Ref Rng & Units 11/20/2021 08/26/2021 06/10/2021  ?Glucose 70 - 99  mg/dL 111(H) 179(H) -  ?BUN 8 - 23 mg/dL 20 11 -  ?Creatinine 0.44 - 1.00 mg/dL 0.69 0.78 0.80  ?BUN/Creat Ratio 12 - 28 - 14 -  ?Sodium 135 - 145 mmol/L 133(L) 135 -  ?Potassium 3.5 - 5.1 mmol/L 4.0 3.6 -  ?Chloride 98 -  111 mmol/L 99 99 -  ?CO2 22 - 32 mmol/L 24 21 -  ?Calcium 8.9 - 10.3 mg/dL 9.6 9.9 -  ?Recheck on 3/22 ? ?13: Hyperlipidemia: intolerant of all statins (no other alternatives listed on home meds) ?14: Hypertension: Controlled off home amlodipine ?             ?Vitals:  ? 11/24/21 1949 11/25/21 0624  ?BP: (!) 164/69 (!) 156/57  ?Pulse: 64 63  ?Resp: 14 14  ?Temp: (!) 97.4 ?F (36.3 ?C) (!) 97.5 ?F (36.4 ?C)  ?SpO2: 97% 93%  ? Uncontrolled resume amlodipine  ?15: Chronic right knee pain: will monitor how this affects therapy , no NSAIDs due to CVA , may use tylenol  ?  ? ?LOS: ?6 days ?A FACE TO FACE EVALUATION WAS PERFORMED ? ?Luanna Salk Leilany Digeronimo ?11/25/2021, 12:19 PM  ? ? ? ?

## 2021-11-25 NOTE — Progress Notes (Signed)
Physical Therapy Session Note ? ?Patient Details  ?Name: Elizabeth [name] ?MRN: [id number] ?Date of Birth: 1941/04/02 ? ?Today's Date: 11/25/2021 ?PT Individual Time: 4008-6761 ?PT Individual Time Calculation (min): 44 min  ? ?Short Term Goals: ?Week 1:  PT Short Term Goal 1 (Week 1): Pt will transfer to [facility] with min assist and LRAD ?PT Short Term Goal 2 (Week 1): Pt will ambulate with RW x 52f with mod assist ?PT Short Term Goal 3 (Week 1): Pt will propell WC 1038fwith min assist ?Week 2:    ?Week 3:    ? ?Skilled Therapeutic Interventions/Progress Updates:  ?  Pt initially supine. ?Supine to sit on edge of bed w/max cues to attend to LUE/LE w/transition, mod assist due to difficulty shifting wt from R to upright. ? ?Min assist w/sitting balance during total assist donning of shoes. ?Sit to stand w/max cues for safety/hand placement on RW/bed. ?Gait 1044fo wc in room w/max cues for safety, pt moving impulsively, leaning to L and poor clearance LLE due to lean + inattention, narrow base, downward gaze. ? ?Transported to gym. ? ?Functional gait: ?[id number] locating and reaching crossbody w/R along path to obtain colored beanbags.  Heavy mod assist w/gait due to L lean, L inattention, narrow base, failure to advance L limb at times, L knee flexion/can correct w/multimodal cues but limited attention =limited ability to maintain.  Pt somewhat distracted from gait by focus on beanbags, poor safety awareness. ? ?Balance: ?Standing w/RW w/mirror for feedback - first worked on midline correcting retraction of L shoulder and hip, correction of L knee flexion.  Then simulated reaching overhead which resulted in return to intitial posture/used mirror to again correct all to midline.  Repeated 3x before pt somewhat impulsively returned to sitting. ? ?Repeated cues during session to attend to LUE positioning during rest breaks into lap vs hanging at side of wc. ? ?At end of session, Pt left oob in wc w/alarm belt set and needs in  reach. ?Therapy Documentation ?Precautions:  ?Precautions ?Precautions: Fall ?Precaution Comments: L hemiparesis ?Restrictions ?Weight Bearing Restrictions: No ? ? ?Therapy/Group: Individual Therapy ?BarbCallie Fielding ? ? ?BarbJerrilyn Cairo20/2023, 12:17 PM  ?

## 2021-11-25 NOTE — Progress Notes (Signed)
Speech Language Pathology Daily Session Note ? ?Patient Details  ?Name: Elizabeth [name] ?MRN: [id number] ?Date of Birth: 09/04/1941 ? ?Today's Date: 11/25/2021 ?SLP Individual Time: 1751-0258 ?SLP Individual Time Calculation (min): 45 min ? ?Short Term Goals: ?Week 1: SLP Short Term Goal 1 (Week 1): Patient will consume current diet without overt s/sx of aspiration with min A verbal cues for use of swallowing compensatory strategies ?SLP Short Term Goal 2 (Week 1): Patient will consume ice chips/thin liquid trials with min overt s/sx of aspiration to indicate readiness for MBS. ?SLP Short Term Goal 3 (Week 1): Patient will utilize speech intelligiblity strategies at the word and phrase level with min-to-mod A cues to achieve 75%+ intelligiblity ?SLP Short Term Goal 4 (Week 1): Patient will complete mildly complex problem solving tasks with min-to-mod A verbal/visual cues ?SLP Short Term Goal 5 (Week 1): Patient will demonstrate awareness to errors during functional and novel tasks with min-to-mod A verbal/visual cues ?SLP Short Term Goal 6 (Week 1): Patient will recall and implement up to 2 beneficial compensatory memory strategies to recall new, daily information with min-to-mod A verbal and question cues ? ?Skilled Therapeutic Interventions: Skilled ST treatment focused on swallowing and speech goals. SLP facilitated session by providing max A verbal and visual cues for performing chin tuck against resistance. Patient completed 3 sets of 10 repetitions with perceived exertion as 3-4/10. SLP educated on goal to achieve 6 or 7/10. Pt appeared fatigued and therefore appeared to benefit from consistent verbal encouragement to persist to task. Patient exhibited productive cough throughout session without presentation of PO intake in which she orally expelled clear secretions into tissue. Pt reported allergies and post nasal drip. Pt required moderate verbal cues to over-articulate and increase vocal loudness at the phrase  level to achieve 75% intelligibility. Patient was left in bed with alarm activated and immediate needs within reach at end of session. Continue per current plan of care.   ?   ?Pain ? None ? ?Therapy/Group: Individual Therapy ? ?Jacquiline Zurcher T Asha Grumbine ?11/25/2021, 2:40 PM ?

## 2021-11-25 NOTE — Progress Notes (Signed)
Physical Therapy Session Note ? ?Patient Details  ?Name: Elizabeth [name] ?MRN: [id number] ?Date of Birth: [birth year] ? ?Today's Date: 11/25/2021 ?PT Individual Time: 9371-6967 and 1501-1600 ?PT Individual Time Calculation (min): 64 min and 59 min ? ?Short Term Goals: ?Week 1:  PT Short Term Goal 1 (Week 1): Pt will transfer to [facility] with min assist and LRAD ?PT Short Term Goal 2 (Week 1): Pt will ambulate with RW x 38f with mod assist ?PT Short Term Goal 3 (Week 1): Pt will propell WC 1079fwith min assist ? ?Skilled Therapeutic Interventions/Progress Updates:  ?Session 1: ?Patient supine in bed on entrance to room. Patient alert and agreeable to PT session.  ? ?Patient with no pain complaint throughout session. ? ?Therapeutic Activity: ?Bed Mobility: Patient performed supine --> sit with ModA to bring L side around to square on EOB. VC/ tc required for technique throughout. At end of session, pt able to initiate sit-->supine requiring MinA to LLE to reach bed surface. Assisted with BLE in hooklying position and bridging to shift hips to L side in order to neutralize body position in bed. Performs bridging with CGA to Mo[place]or fatigue.  ? ?While seated EOB at start of session, pt requires ModA to don pants while in sitting and then completing in standing using RW.  ? ?Transfers: Patient performed sit<>stand and stand pivot transfers throughout session with intermittent impulsivity and Min/ModA. During morning session pt noted to require more assist for balance and weakness. Provided vc/ tc throughout for positioning, technique, RW placement and body awareness. At end of session, pt able to perform stand pivot using bedrail for UE support and CGA.  ? ?Gait Training:  ?Patient ambulated 15 ft to bathroom from bed using RW with L hand saddle splint and ModA for LLE advancement and correction of L lean/ push from R. Provided max multimodal cues for correction of lean. Pt able to correct for brief time but continues to require up  to Mod/ max for maintaining balance. Then ambulates 100 feet from room to day room mat table with ModA for LLE advancement and to correct push to L.  ? ?Neuromuscular Re-ed: ?NMR facilitated during session with focus on midline orientation, standing balance. Pt guided in sit<>stand to/ from mat table and then from stability bench. Pt with impulsivity noted with some rise to stand and in pivot stepping. NMR performed for improvements in motor control and coordination, balance, sequencing, judgement, and self confidence/ efficacy in performing all aspects of mobility at highest level of independence.  ? ? ?Patient supine  in bed at end of session with brakes locked, bed alarm set, and all needs within reach. ? ?Session 2: ?Patient supine in bed on entrance to room. Patient alert and agreeable to PT session.  ? ?Patient with no pain complaint throughout session. ? ?Therapeutic Activity: ?Bed Mobility: Patient performed supine --> sit with ModA to bring L side around to square on EOB. VC/ tc required for technique throughout. At end of session, pt able to initiate sit-->supine requiring MinA to LLE to reach bed surface. Assisted with BLE in hooklying position and bridging to shift hips to L side in order to neutralize body position in bed. Performs bridging with CGA to Mo[place]or fatigue.  ?Transfers: Patient performed sit<>stand and stand pivot transfers throughout session with MinA for balance. Provided verbal cues for technique and managing RW throughout. Improvement noted from morning session. ? ?Gait Training:  ?Patient ambulated 1328x1/ 7535x1 using RW with  L hand saddle splint with MinA for upright posture, balance, LLE advancement, and reducing push to L. Provided vc/ tc for all corrections and maintaining L foot clearance.  ? ?Wheelchair Mobility:  ?Extra low height w/c acquired in order to initiate w/c mobility training. Patient propelled wheelchair 100 feet with Mod A to maintain straight path. Provided  education on hemitechnique, however pt is only able to coordinate RUE alone or BLE alone for propulsion despite multimodal cueing on technique. ? ?Neuromuscular Re-ed: ?NMR facilitated during session with focus on upright posture, midline orientation in sitting and standing. Pt guided in proprioceptive training for midline. Requires consistent cues for position and required correction. Improved performance of minisquats with pt able to complete 4 in a row prior to descent to sit. Completes 10 reps total with vc/tc for lateral weight shift and technique. NMR performed for improvements in motor control and coordination, balance, sequencing, judgement, and self confidence/ efficacy in performing all aspects of mobility at highest level of independence.  ? ?Patient supine  in ed at end of session with brakes locked, bed alarm set, and all needs within reach. ? ? ?Therapy Documentation ?Precautions:  ?Precautions ?Precautions: Fall ?Precaution Comments: L hemiparesis ?Restrictions ?Weight Bearing Restrictions: No ?General: ?  ?Vital Signs: ? ?Pain: ? No pain complaint this session.  ? ?Therapy/Group: Individual Therapy ? ?Alger Simons PT, DPT ?11/25/2021, 5:10 PM  ?

## 2021-11-26 DIAGNOSIS — I6381 Other cerebral infarction due to occlusion or stenosis of small artery: Secondary | ICD-10-CM | POA: Diagnosis not present

## 2021-11-26 LAB — GLUCOSE, CAPILLARY
Glucose-Capillary: 120 mg/dL — ABNORMAL HIGH (ref 70–99)
Glucose-Capillary: 116 mg/dL — ABNORMAL HIGH (ref 70–99)
Glucose-Capillary: 117 mg/dL — ABNORMAL HIGH (ref 70–99)
Glucose-Capillary: 115 mg/dL — ABNORMAL HIGH (ref 70–99)

## 2021-11-26 LAB — CBC
HCT: 35.8 % — ABNORMAL LOW (ref 36.0–46.0)
Hemoglobin: 11.7 g/dL — ABNORMAL LOW (ref 12.0–15.0)
MCH: 27.1 pg (ref 26.0–34.0)
MCHC: 32.7 g/dL (ref 30.0–36.0)
MCV: 83.1 fL (ref 80.0–100.0)
Platelets: 249 10*3/uL (ref 150–400)
RBC: 4.31 MIL/uL (ref 3.87–5.11)
RDW: 13.8 % (ref 11.5–15.5)
WBC: 4.8 10*3/uL (ref 4.0–10.5)
nRBC: 0 % (ref 0.0–0.2)

## 2021-11-26 LAB — URINALYSIS, COMPLETE (UACMP) WITH MICROSCOPIC
Bilirubin Urine: NEGATIVE
Glucose, UA: NEGATIVE mg/dL
Hgb urine dipstick: NEGATIVE
Ketones, ur: NEGATIVE mg/dL
Nitrite: POSITIVE — AB
Protein, ur: NEGATIVE mg/dL
Specific Gravity, Urine: 1.017 (ref 1.005–1.030)
WBC, UA: >50 WBC/hpf — ABNORMAL HIGH (ref 0–5)
pH: 6 (ref 5.0–8.0)

## 2021-11-26 LAB — BASIC METABOLIC PANEL
Anion gap: 8 (ref 5–15)
BUN: 17 mg/dL (ref 8–23)
CO2: 25 mmol/L (ref 22–32)
Calcium: 9.5 mg/dL (ref 8.9–10.3)
Chloride: 102 mmol/L (ref 98–111)
Creatinine, Ser: 0.72 mg/dL (ref 0.44–1.00)
GFR, Estimated: >60 mL/min (ref >60)
Glucose, Bld: 98 mg/dL (ref 70–99)
Potassium: 4.4 mmol/L (ref 3.5–5.1)
Sodium: 135 mmol/L (ref 135–145)

## 2021-11-26 NOTE — Progress Notes (Signed)
Patient ID: Elizabeth [name], female   DOB: Jan 23, 1941, 81 y.o.   MRN: [id number] ?Followed up the patient regarding rehab plan of care, medications and secondary stroke risks. Patient noted statin induced cirrhosis; discussed dietary modifications and food choices along with DAPT. Continue to follow along to discharge to address educational needs to facilitate preparation for discharge. Dorien Chihuahua B ? ?

## 2021-11-26 NOTE — Progress Notes (Signed)
Speech Language Pathology Daily Session Note ? ?Patient Details  ?Name: Elizabeth [name] ?MRN: [id number] ?Date of Birth: 09/29/1940 ? ?Today's Date: 11/26/2021 ?SLP Individual Time: 1445-1530 ?SLP Individual Time Calculation (min): 45 min ? ?Short Term Goals: ?Week 1: SLP Short Term Goal 1 (Week 1): Patient will consume current diet without overt s/sx of aspiration with min A verbal cues for use of swallowing compensatory strategies ?SLP Short Term Goal 2 (Week 1): Patient will consume ice chips/thin liquid trials with min overt s/sx of aspiration to indicate readiness for MBS. ?SLP Short Term Goal 3 (Week 1): Patient will utilize speech intelligiblity strategies at the word and phrase level with min-to-mod A cues to achieve 75%+ intelligiblity ?SLP Short Term Goal 4 (Week 1): Patient will complete mildly complex problem solving tasks with min-to-mod A verbal/visual cues ?SLP Short Term Goal 5 (Week 1): Patient will demonstrate awareness to errors during functional and novel tasks with min-to-mod A verbal/visual cues ?SLP Short Term Goal 6 (Week 1): Patient will recall and implement up to 2 beneficial compensatory memory strategies to recall new, daily information with min-to-mod A verbal and question cues ? ?Skilled Therapeutic Interventions: Skilled ST treatment focused on speech and swallowing goals. NT present for nsg needs and reported pt exhibited intermittent cough with intake during lunch, and benefited from cues to reduce bolus size. Pt continues to exhibit cough at baseline without presentation of PO intake. Patient was agreeable to participate in therapeutic PO trials with dys 1 textures and nectar thick liquids (NTL). Patient exhibited lingual pumping with NTL from straw and cup sips, and suspected swallow initiation delay with both puree and NTL. Patient required min A cues to take small, single sips. Pt exhibited no overt s/sx of aspiration with all tested consistencies. Pt was restless in bed and required  intermittent repositioning during trials to ensure she was upright and in adequate position for intake. Recommend pt continue with current diet. Patient recalled speech intelligibility strategies with sup A verbal cues to refer to SLOP strategy handout in room. Pt utilized strategies at the word and phrase level with min-to-mod A to achieve 75% intelligibility in known contexts. Patient was left in bed with alarm activated and immediate needs within reach at end of session. Continue per current plan of care.   ?   ?Pain ?Pain Assessment ?Pain Scale: 0-10 ?Pain Score: 0-No pain ? ?Therapy/Group: Individual Therapy ? ?Brentt Fread T Bastian Andreoli ?11/26/2021, 2:57 PM ?

## 2021-11-26 NOTE — Progress Notes (Signed)
Occupational Therapy Session Note ? ?Patient Details  ?Name: Elizabeth [name] ?MRN: [id number] ?Date of Birth: 08/26/1941 ? ?Today's Date: 11/26/2021 ?OT Individual Time: 3888-2800 ?OT Individual Time Calculation (min): 62 min  ? ? ?Short Term Goals: ?Week 1:  OT Short Term Goal 1 (Week 1): Pt will maintain balance seated EOB > 8 min with no more than CGA in prep for seated ADL. ?OT Short Term Goal 2 (Week 1): Pt will complete toilet transfer with max A and LRAD. ?OT Short Term Goal 3 (Week 1): Pt will complete LBD with max A and LRAD. ?OT Short Term Goal 4 (Week 1): Pt will don shirt with mod A. ? ?Skilled Therapeutic Interventions/Progress Updates:  ?  Pt received semi-reclined in bed, ongoing R side pain, but not requesting intervention at this time, agreeable to therapy with encouragement. Session focus on self-care retraining, activity tolerance, transfer retraining, LUE NMR in prep for improved ADL/IADL/func mobility performance + decreased caregiver burden. ? Came to sitting EOB on her L with heavy min A to progress LLE off bed and to lift trunk with use of bed features. Continues to require min to mod A for static sitting balance due to L lean. Donned pants with overall heavy mod A to thread BLE and to pull up on L hip/balance. Doffed shirt mod A to remove over head and remove RUE, donned new shirt with mod A to thread LUE and to pull down in back. Max A to adjust B heels in shoes. Stand-pivot to her L with therapist in front and no AD and light mod A to power up and to facilitate WS. Seated, completed oral care with suction sponge with S and set-up A of materials, completed additional grooming tasks with S and max A to incorporate LUE as a stabilizer. Declined eating breakfast due to not liking food sent up, updated food preference sheet outside of room and LPN/NT aware.  ? ?Total A w/c transport to and from gym. Pt completed 2x10 of the following to promote L attention/LUE NMR : forward/backward circles, B  shoulder flexion, and chest press with 2 lb dowel rod and mod to max physical assist to support LUE. ? ?Pt left seated in w/c with safety belt alarm engaged, call bell in reach, and all immediate needs met.  ? ? ?Therapy Documentation ?Precautions:  ?Precautions ?Precautions: Fall ?Precaution Comments: L hemiparesis ?Restrictions ?Weight Bearing Restrictions: No ? ?Pain: ? See session note ?ADL: See Care Tool for more details. ? ?Therapy/Group: Individual Therapy ? ?Volanda Napoleon MS, OTR/L ? ?11/26/2021, 6:49 AM ?

## 2021-11-26 NOTE — Progress Notes (Signed)
?                                                       PROGRESS NOTE ? ? ?Subjective/Complaints: ?Frequent urination at noc but no burning, has seen Urology in past , Dr Jeffie Pollock diagnosed OAB ?Labs reviewed borderline normochromic , normocytic anemia , o/w nl  ?ROS- neg CP, SOB, N/V/D ?Objective: ?  ?No results found. ?Recent Labs  ?  11/26/21 ?5035  ?WBC 4.8  ?HGB 11.7*  ?HCT 35.8*  ?PLT 249  ? ? ?Recent Labs  ?  11/26/21 ?4656  ?NA 135  ?K 4.4  ?CL 102  ?CO2 25  ?GLUCOSE 98  ?BUN 17  ?CREATININE 0.72  ?CALCIUM 9.5  ? ? ? ?Intake/Output Summary (Last 24 hours) at 11/26/2021 0853 ?Last data filed at 11/25/2021 1800 ?Gross per 24 hour  ?Intake 472 ml  ?Output --  ?Net 472 ml  ? ?  ? ?  ? ?Physical Exam: ?Vital Signs ?Blood pressure (!) 143/59, pulse (!) 55, temperature 98.2 ?F (36.8 ?C), temperature source Oral, resp. rate 16, height '5\' 2"'$  (1.575 m), weight 63.4 kg, SpO2 100 %. ? ?General: No acute distress ?Mood and affect are appropriate ?Heart: Regular rate and rhythm no rubs murmurs or extra sounds ?Lungs: Clear to auscultation, breathing unlabored, no rales or wheezes ?Abdomen: Positive bowel sounds, soft nontender to palpation, nondistended ?Extremities: No clubbing, cyanosis, or edema ?Skin: No evidence of breakdown, no evidence of rash ?Neurologic: Cranial nerves II through XII intact, motor strength is 5/5 in RIght and 4-/5 left deltoid, bicep, tricep, grip, hip flexor, knee extensors, ankle dorsiflexor and plantar flexor ?Sensory exam normal sensation to light touch and proprioception in bilateral upper and lower extremities ?Cerebellar exam normal finger to nose to finger as well as heel to shin in bilateral upper and lower extremities ?Musculoskeletal: Full range of motion in all 4 extremities. No joint swelling ? ? ? ?Assessment/Plan: ?1. Functional deficits which require 3+ hours per day of interdisciplinary therapy in a comprehensive inpatient rehab setting. ?Physiatrist is providing close team  supervision and 24 hour management of active medical problems listed below. ?Physiatrist and rehab team continue to assess barriers to discharge/monitor patient progress toward functional and medical goals ? ?Care Tool: ? ?Bathing ?   ?Body parts bathed by patient: Left arm, Chest, Abdomen, Right upper leg, Left upper leg, Right lower leg, Left lower leg, Face  ? Body parts bathed by helper: Right arm ?  ?  ?Bathing assist Assist Level: Moderate Assistance - Patient 50 - 74% ?  ?  ?Upper Body Dressing/Undressing ?Upper body dressing   ?What is the patient wearing?: Pull over shirt ?   ?Upper body assist Assist Level: Maximal Assistance - Patient 25 - 49% ?   ?Lower Body Dressing/Undressing ?Lower body dressing ? ? ?   ?What is the patient wearing?: Underwear/pull up ? ?  ? ?Lower body assist Assist for lower body dressing: Moderate Assistance - Patient 50 - 74% ?   ? ?Toileting ?Toileting Toileting Activity did not occur (Probation officer and hygiene only): N/A (no void or bm)  ?Toileting assist Assist for toileting: Moderate Assistance - Patient 50 - 74% Dobbin will yurn to be changed) ?  ?  ?Transfers ?Chair/bed transfer ? ?Transfers assist ?   ? ?Chair/bed transfer assist level:  Maximal Assistance - Patient 25 - 49% ?  ?  ?Locomotion ?Ambulation ? ? ?Ambulation assist ? ? Ambulation activity did not occur: Safety/medical concerns ? ?  ?  ?   ? ?Walk 10 feet activity ? ? ?Assist ? Walk 10 feet activity did not occur: Safety/medical concerns ? ?  ?   ? ?Walk 50 feet activity ? ? ?Assist Walk 50 feet with 2 turns activity did not occur: Safety/medical concerns ? ?  ?   ? ? ?Walk 150 feet activity ? ? ?Assist Walk 150 feet activity did not occur: Safety/medical concerns ? ?  ?  ?  ? ?Walk 10 feet on uneven surface  ?activity ? ? ?Assist Walk 10 feet on uneven surfaces activity did not occur: Safety/medical concerns ? ? ?  ?   ? ?Wheelchair ? ? ? ? ?Assist Is the patient using a wheelchair?: Yes ?Type of Wheelchair:  Manual ?  ? ?Wheelchair assist level: Dependent - Patient 0% ?Max wheelchair distance: 150  ? ? ?Wheelchair 50 feet with 2 turns activity ? ? ? ?Assist ? ?  ?  ? ? ?Assist Level: Dependent - Patient 0%  ? ?Wheelchair 150 feet activity  ? ? ? ?Assist ?   ? ? ?Assist Level: Dependent - Patient 0%  ? ?Blood pressure (!) 143/59, pulse (!) 55, temperature 98.2 ?F (36.8 ?C), temperature source Oral, resp. rate 16, height '5\' 2"'$  (1.575 m), weight 63.4 kg, SpO2 100 %. ? ?Medical Problem List and Plan: ?1. 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            -patient may shower ?            -ELOS/Goals: 14-20 days, min assist to supervision goals, team conf in am  ?2.  Antithrombotics: ?-DVT/anticoagulation:  Pharmaceutical: Lovenox ?            -antiplatelet therapy: Plavix and aspirin ?3. Pain Management: Tylenol ?4. Mood: LCSW to evaluate and provide emotional support ?            -antipsychotic agents: Not applicable ?5. Neuropsych: This patient is capable of making decisions on her own behalf. ?6. Skin/Wound Care: Routine skin care checks ?7. Fluids/Electrolytes/Nutrition: Routine I's and O's and follow-up chemistries ?Dysphagia , MBS performed at OSH ?            -- Pur?ed level 4 diet, thickened liquids. ?            -- Crushed meds in applesauce ?            -- 1 to 1 supervision with meals ?            -- SLP eval on admission ?8: DM-2: carb modified diet ?--CBGs and SSI (no home meds) ?CBG (last 3)  ?Recent Labs  ?  11/25/21 ?1648 11/25/21 ?2105 11/26/21 ?0621  ?GLUCAP 108* 134* 120*  ? ?Controlled 3/21- rarely receiving SSI, may reduce to BID  ? ?9: Seasonal allergies: Continue symptomatic relief as needed ?10: GERD: Continue Protonix ?11: COPD: Continue albuterol nebs as needed (no home meds)- smoking hx  ?            --no supplemental O2 needs ?12: Hypokalemia: resolved with supplementation>>>continue and  follow-up BMP ?BMP Latest Ref Rng & Units 11/26/2021 11/20/2021 08/26/2021  ?Glucose 70 - 99 mg/dL 98 111(H) 179(H)  ?BUN 8 - 23 mg/dL 17 20  11  ?Creatinine 0.44 - 1.00 mg/dL 0.72 0.69 0.78  ?BUN/Creat Ratio 12 - 28 - - 14  ?Sodium 135 - 145 mmol/L 135 133(L) 135  ?Potassium 3.5 - 5.1 mmol/L 4.4 4.0 3.6  ?Chloride 98 - 111 mmol/L 102 99 99  ?CO2 22 - 32 mmol/L '25 24 21  '$ ?Calcium 8.9 - 10.3 mg/dL 9.5 9.6 9.9  ?Recheck on 3/21 normal  ? ?13: Hyperlipidemia: intolerant of all statins (no other alternatives listed on home meds) ?14: Hypertension: Controlled off home amlodipine ?             ?Vitals:  ? 11/25/21 2029 11/26/21 0331  ?BP: (!) 148/56 (!) 143/59  ?Pulse: 66 (!) 55  ?Resp: 16 16  ?Temp: (!) 97.5 ?F (36.4 ?C) 98.2 ?F (36.8 ?C)  ?SpO2: 98% 100%  ? Uncontrolled but improving 3/21 resume amlodipine started '5mg'$  on 3/20, monitor for 3-4 d prior to dose adjustment  ?15: Chronic right knee pain: will monitor how this affects therapy , no NSAIDs due to CVA , may use tylenol  ? 16.  Neurogenic bladder - spastic , due to CVA , will first check UA, if neg can start anticholinergic and monitor for retention  ? ?LOS: ?7 days ?A FACE TO FACE EVALUATION WAS PERFORMED ? ?Luanna Salk Von Quintanar ?11/26/2021, 8:53 AM  ? ? ? ?

## 2021-11-27 ENCOUNTER — Encounter (HOSPITAL_COMMUNITY): Payer: Self-pay | Admitting: Physical Medicine & Rehabilitation

## 2021-11-27 ENCOUNTER — Other Ambulatory Visit: Payer: Self-pay

## 2021-11-27 ENCOUNTER — Inpatient Hospital Stay (HOSPITAL_COMMUNITY): Payer: Medicare Other

## 2021-11-27 DIAGNOSIS — I6381 Other cerebral infarction due to occlusion or stenosis of small artery: Secondary | ICD-10-CM | POA: Diagnosis not present

## 2021-11-27 LAB — GLUCOSE, CAPILLARY
Glucose-Capillary: 98 mg/dL (ref 70–99)
Glucose-Capillary: 117 mg/dL — ABNORMAL HIGH (ref 70–99)
Glucose-Capillary: 143 mg/dL — ABNORMAL HIGH (ref 70–99)
Glucose-Capillary: 105 mg/dL — ABNORMAL HIGH (ref 70–99)

## 2021-11-27 MED ORDER — CEPHALEXIN 250 MG PO CAPS
250.0000 mg | ORAL_CAPSULE | Freq: Three times a day (TID) | ORAL | Status: AC
  Administered 2021-11-27 – 2021-12-04 (×21): 250 mg via ORAL
  Filled 2021-11-27 (×21): qty 1

## 2021-11-27 NOTE — Progress Notes (Signed)
Occupational Therapy Weekly Progress Note ? ?Patient Details  ?Name: Elizabeth [name] ?MRN: [id number] ?Date of Birth: 11-02-1940 ? ?Beginning of progress report period: November 20, 2021 ?End of progress report period: November 27, 2021 ? ?Today's Date: 11/27/2021 ?OT Individual Time: 4008-6761 ?OT Individual Time Calculation (min): 53 min + 27 min ? ? ?Patient has met 4 of 4 short term goals.  Pt has made steady progress this week towards LTG. Pt has demonstrated improved static sitting and standing balance, ability to compensate for deficits, activity tolerance, GMC to presently complete UBD at 2 level, LBD at mod A level, min to mod A full body bathing seated at sink, and ambulatory toilet transfers with mod A and RW. Pt cont to be primarily limited by L hemiparesis, inattention, poor sitting balance in the am, decreased insight into deficits. LUE and hand currently assessed at Brummstrom level III and III respectively. Anticipate 24/7 S and CGA physical assist required upon DC. ? ? ?Patient continues to demonstrate the following deficits: muscle weakness, decreased cardiorespiratoy endurance, impaired timing and sequencing, abnormal tone, unbalanced muscle activation, decreased coordination, and decreased motor planning, decreased attention to left and decreased motor planning, decreased attention, decreased awareness, decreased problem solving, and decreased safety awareness, and decreased sitting balance, decreased standing balance, decreased postural control, hemiplegia, and decreased balance strategies and therefore will continue to benefit from skilled OT intervention to enhance overall performance with BADL and Reduce care partner burden. ? ?Patient progressing toward long term goals..  Continue plan of care. ? ?OT Short Term Goals ?Week 1:  OT Short Term Goal 1 (Week 1): Pt will maintain balance seated EOB > 8 min with no more than CGA in prep for seated ADL. ?OT Short Term Goal 1 - Progress (Week 1): Met ?OT Short  Term Goal 2 (Week 1): Pt will complete toilet transfer with max A and LRAD. ?OT Short Term Goal 2 - Progress (Week 1): Met ?OT Short Term Goal 3 (Week 1): Pt will complete LBD with max A and LRAD. ?OT Short Term Goal 3 - Progress (Week 1): Met ?OT Short Term Goal 4 (Week 1): Pt will don shirt with mod A. ?OT Short Term Goal 4 - Progress (Week 1): Met ?Week 2:  OT Short Term Goal 1 (Week 2): Pt will don pants with min A and AE PRN. ?OT Short Term Goal 2 (Week 2): Pt will complete ambulatory toilet transfer with min A. ?OT Short Term Goal 3 (Week 2): Pt will complete standing grooming task with no more than CGA. ? ?Skilled Therapeutic Interventions/Progress Updates:  ?  Session 1 [phone]): Pt received semi-reclined in bed, no c/o pain but reports poor sleep due to sinus congestion, agreeable to therapy. Session focus on self-care retraining, activity tolerance, LUE NMR, transfer retraining in prep for improved ADL/IADL/func mobility performance + decreased caregiver burden. Came to sitting EOB on her R with mod A to progress hemiparetic body and to lift trunk, cues for technique. Short ambulatory transfer with RW / L saddle splint with min A to power up and for light steadying assist. Doffed night gown mod A to remove from head/BLE. Bathed face with S, applied deodorant with max hand over hand assist to incorporate L hand. Declined further bathing. Donned shirt with only S this date, but poor carry over of hemi dressing technique education. Donned underwear/pants with mod A to thread RLE and to pull over L hip. Continues to demo poor trunk control requiring to sit supported in w/c  during ADL tasks for best performance. Stood to complete grooming tasks with min A due to L lean. ? ?Total A w/c transport to and from gym. Stood to complete 2x15 of forward / diagonal towel slides, circle towel slides, modified push-ups to promote LUE NMR, min A for balance throughout. ? ? ?Pt left seated in w/c with LPN present with  safety belt alarm engaged, call bell in reach, and all immediate needs met.  ? ? ?Session 2 [phone]): Pt received semi-reclined in bed, no c/o pain but reports discomfort due to difficulty retrieving urine sample earlier, agreeable to therapy. Session focus on activity tolerance, static sitting balance, LUE NMR in prep for improved ADL/IADL/func mobility performance + decreased caregiver burden. Came to sitting EOB mod A to progress BLE off bed and to lift trunk. Much improved static sitting balance with CGA > fading to heavy min by end of session due to fatigue/episode of coughing with sip of water despite cues for small/slow sips.  ? ?Siting EOB, pt participated in picking up small foam cubes and placing in cup, placing/removing large beads in theraputty, and rolling theraputty back and forth. Required mod physical assist to support LUE as pt able to utilize tenodesis grasp to pick up objects. Returned to side-lying with S. ? ? ?Pt left side-lying in bed with bed alarm engaged, call bell in reach, and all immediate needs met.  ? ? ?Therapy Documentation ?Precautions:  ?Precautions ?Precautions: Fall ?Precaution Comments: L hemiparesis ?Restrictions ?Weight Bearing Restrictions: No ? ?Pain: see session notes ?  ?ADL: See Care Tool for more details. ? ?Therapy/Group: Individual Therapy ? ?Volanda Napoleon MS, OTR/L ? ?11/27/2021, 6:56 AM  ?

## 2021-11-27 NOTE — Progress Notes (Signed)
?                                                       PROGRESS NOTE ? ? ?Subjective/Complaints: ?Nasal congestion and cough, has hx of seasonal allergies.  Coughing up yellowish sputum, no respiratory distress, no DOE  ?ROS- neg CP, SOB, N/V/D ?Objective: ?  ?No results found. ?Recent Labs  ?  11/26/21 ?7517  ?WBC 4.8  ?HGB 11.7*  ?HCT 35.8*  ?PLT 249  ? ? ?Recent Labs  ?  11/26/21 ?0017  ?NA 135  ?K 4.4  ?CL 102  ?CO2 25  ?GLUCOSE 98  ?BUN 17  ?CREATININE 0.72  ?CALCIUM 9.5  ? ? ? ?Intake/Output Summary (Last 24 hours) at 11/27/2021 0929 ?Last data filed at 11/26/2021 1946 ?Gross per 24 hour  ?Intake 336 ml  ?Output --  ?Net 336 ml  ? ?  ? ?  ? ?Physical Exam: ?Vital Signs ?Blood pressure (!) 150/54, pulse (!) 56, temperature 97.8 ?F (36.6 ?C), resp. rate 15, height '5\' 2"'$  (1.575 m), weight 63.4 kg, SpO2 96 %. ? ?General: No acute distress ?Mood and affect are appropriate ?Heart: Regular rate and rhythm no rubs murmurs or extra sounds ?Lungs: Clear to auscultation, breathing unlabored, no rales or wheezes, poor inspiratory effeort  ?Abdomen: Positive bowel sounds, soft nontender to palpation, nondistended ?Extremities: No clubbing, cyanosis, or edema ?Skin: No evidence of breakdown, no evidence of rash ?Neurologic: Cranial nerves II through XII intact, motor strength is 5/5 in RIght and 4-/5 left deltoid, bicep, tricep, grip, hip flexor, knee extensors, ankle dorsiflexor and plantar flexor ?Sensory exam normal sensation to light touch and proprioception in bilateral upper and lower extremities ?Cerebellar exam normal finger to nose to finger as well as heel to shin in bilateral upper and lower extremities ?Musculoskeletal: Full range of motion in all 4 extremities. No joint swelling ? ? ? ?Assessment/Plan: ?1. Functional deficits which require 3+ hours per day of interdisciplinary therapy in a comprehensive inpatient rehab setting. ?Physiatrist is providing close team supervision and 24 hour management of active  medical problems listed below. ?Physiatrist and rehab team continue to assess barriers to discharge/monitor patient progress toward functional and medical goals ? ?Care Tool: ? ?Bathing ?   ?Body parts bathed by patient: Left arm, Chest, Abdomen, Right upper leg, Left upper leg, Right lower leg, Left lower leg, Face  ? Body parts bathed by helper: Right arm ?  ?  ?Bathing assist Assist Level: Moderate Assistance - Patient 50 - 74% ?  ?  ?Upper Body Dressing/Undressing ?Upper body dressing   ?What is the patient wearing?: Pull over shirt ?   ?Upper body assist Assist Level: Supervision/Verbal cueing ?   ?Lower Body Dressing/Undressing ?Lower body dressing ? ? ?   ?What is the patient wearing?: Underwear/pull up ? ?  ? ?Lower body assist Assist for lower body dressing: Moderate Assistance - Patient 50 - 74% ?   ? ?Toileting ?Toileting Toileting Activity did not occur (Probation officer and hygiene only): N/A (no void or bm)  ?Toileting assist Assist for toileting: Moderate Assistance - Patient 50 - 74% ?  ?  ?Transfers ?Chair/bed transfer ? ?Transfers assist ?   ? ?Chair/bed transfer assist level: Maximal Assistance - Patient 25 - 49% ?  ?  ?Locomotion ?Ambulation ? ? ?Ambulation assist ? ?  Ambulation activity did not occur: Safety/medical concerns ? ?  ?  ?   ? ?Walk 10 feet activity ? ? ?Assist ? Walk 10 feet activity did not occur: Safety/medical concerns ? ?  ?   ? ?Walk 50 feet activity ? ? ?Assist Walk 50 feet with 2 turns activity did not occur: Safety/medical concerns ? ?  ?   ? ? ?Walk 150 feet activity ? ? ?Assist Walk 150 feet activity did not occur: Safety/medical concerns ? ?  ?  ?  ? ?Walk 10 feet on uneven surface  ?activity ? ? ?Assist Walk 10 feet on uneven surfaces activity did not occur: Safety/medical concerns ? ? ?  ?   ? ?Wheelchair ? ? ? ? ?Assist Is the patient using a wheelchair?: Yes ?Type of Wheelchair: Manual ?  ? ?Wheelchair assist level: Dependent - Patient 0% ?Max wheelchair distance:  150  ? ? ?Wheelchair 50 feet with 2 turns activity ? ? ? ?Assist ? ?  ?  ? ? ?Assist Level: Dependent - Patient 0%  ? ?Wheelchair 150 feet activity  ? ? ? ?Assist ?   ? ? ?Assist Level: Dependent - Patient 0%  ? ?Blood pressure (!) 150/54, pulse (!) 56, temperature 97.8 ?F (36.6 ?C), resp. rate 15, height '5\' 2"'$  (1.575 m), weight 63.4 kg, SpO2 96 %. ? ?Medical Problem List and Plan: ?1. 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            -patient may shower ?            -ELOS/Goals: 14-20 days, min assist to supervision goals, team conf in am  ?2.  Antithrombotics: ?-DVT/anticoagulation:  Pharmaceutical: Lovenox ?            -antiplatelet therapy: Plavix and aspirin ?3. Pain Management: Tylenol ?4. Mood: LCSW to evaluate and provide emotional support ?            -antipsychotic agents: Not applicable ?5. Neuropsych: This patient is capable of making decisions on her own behalf. ?6. Skin/Wound Care: Routine skin care checks ?7. Fluids/Electrolytes/Nutrition: Routine I's and O's and follow-up chemistries ?Dysphagia , MBS performed at OSH ?            -- Pur?ed level 4 diet, thickened liquids. ?            -- Crushed meds in applesauce ?            -- 1 to 1 supervision with meals ?            -- SLP eval on admission ?8: DM-2: carb modified diet ?--CBGs and SSI (no home meds) ?CBG (last 3)  ?Recent Labs  ?  11/26/21 ?1653 11/26/21 ?2307 11/27/21 ?0613  ?GLUCAP 117* 115* 98  ? ?Controlled 3/22 ? ?9: Seasonal allergies: Continue symptomatic relief as needed ?10: GERD: Continue Protonix ?11: COPD: Continue albuterol nebs as needed (no home meds)- smoking hx  ?            --no supplemental O2 needs ?12: Hypokalemia: resolved with supplementation>>>continue and follow-up BMP ? ?  Latest Ref Rng & Units 11/26/2021  ?  5:26 AM 11/20/2021  ?  5:29 AM 08/26/2021  ?  8:45 AM  ?BMP  ?Glucose 70 - 99 mg/dL 98   111   179    ?BUN 8  - 23 mg/dL '17   20   11    '$ ?  Creatinine 0.44 - 1.00 mg/dL 0.72   0.69   0.78    ?BUN/Creat Ratio 12 - 28   14    ?Sodium 135 - 145 mmol/L 135   133   135    ?Potassium 3.5 - 5.1 mmol/L 4.4   4.0   3.6    ?Chloride 98 - 111 mmol/L 102   99   99    ?CO2 22 - 32 mmol/L '25   24   21    '$ ?Calcium 8.9 - 10.3 mg/dL 9.5   9.6   9.9    ?Recheck on 3/21 normal  ? ?13: Hyperlipidemia: intolerant of all statins (no other alternatives listed on home meds) ?14: Hypertension: Controlled off home amlodipine ?             ?Vitals:  ? 11/26/21 2043 11/27/21 0319  ?BP: (!) 146/61 (!) 150/54  ?Pulse: 62 (!) 56  ?Resp: 20 15  ?Temp: 97.8 ?F (36.6 ?C) 97.8 ?F (36.6 ?C)  ?SpO2: 97% 96%  ? Uncontrolled but improving 3/22 resume amlodipine started '5mg'$  on 3/20, monitor for 2-3 d prior to dose adjustment  ?15: Chronic right knee pain: will monitor how this affects therapy , no NSAIDs due to CVA , may use tylenol  ? 16.  Neurogenic bladder - spastic , due to CVA , will first check UA, if neg can start anticholinergic and monitor for retention , UA + check culture , start keflex pending result  ? ?LOS: ?8 days ?A FACE TO FACE EVALUATION WAS PERFORMED ? ?Luanna Salk Altair Stanko ?11/27/2021, 9:29 AM  ? ? ? ?

## 2021-11-27 NOTE — Progress Notes (Signed)
Physical Therapy Session Note ? ?Patient Details  ?Name: Elizabeth [name] ?MRN: [id number] ?Date of Birth: [birth year] ? ?Today's Date: 11/26/2021 ?PT Individual Time:  1030-1101 and 5003-7048 ?PT individual Time Calculation (min): 31 min and 73 min ? ?Short Term Goals: ?Week 1:  PT Short Term Goal 1 (Week 1): Pt will transfer to [facility] with min assist and LRAD ?PT Short Term Goal 1 - Progress (Week 1): Met ?PT Short Term Goal 2 (Week 1): Pt will ambulate with RW x 56f with mod assist ?PT Short Term Goal 2 - Progress (Week 1): Met ?PT Short Term Goal 3 (Week 1): Pt will propell WC 1017fwith min assist ?PT Short Term Goal 3 - Progress (Week 1): Met ?Week 2:  PT Short Term Goal 1 (Week 2): Pt will perform bed mobiltiy with min assist consistently ?PT Short Term Goal 2 (Week 2): Pt will ambulate 7552fith min assist consistently ?PT Short Term Goal 3 (Week 2): Pt [facility] consistently [facility] ?PT Short Term Goal 4 (Week 2): Pt will ascend 4 steps with min assist and rails as needed ? ?Skilled Therapeutic Interventions/Progress Updates:  ?Session 1: ?Patient supine in bed on entrance to room. Patient alert and agreeable to PT session. Already dressed and toileted with OT in previous session.  ? ?Husband in room.  ? ?Patient with no pain complaint throughout session. ? ?Therapeutic Activity/NMR: ?Bed Mobility: Patient performed supine --> sit with MinA and max cueing for technique as she is unable to fully engage abdominals. VC/ tc  for attn to L arm and use of R hand to bring arm in front of pt. TC for rolling L hip over and to bring L shoulder over. Cue for push to R to rise from bed and MinA to reach upright seated position. Sitting balance continues to be poor with need for R hand at bed rail and LOB to L d/t intermittent push and LOB posteriorly with lift of feet from floor, Shoes donned in seated position with several LOB posteriorly with lift of foot for donning MaxA. Continuous lean to L. Entire time in sitting  requires continuous vc for proprioception of position and required movement to reach midline/ upright posture.  ? ?Transfers: Patient performed sit<>stand transfers throughout session with CGA initially and up to Min/ ModA to correct for melt to L with faitgue which sets in quickly. Provided verbal cues for desired positioning . ? ?NMR performed for improvements in motor control and coordination, balance, sequencing, judgement, and self confidence/ efficacy in performing all aspects of mobility at highest level of independence.  ? ?Patient supine  in bed at end of session with brakes locked, bed alarm set, and all needs within reach. Oriented to time and time of next session with therapist. ? ?Session 2: ?Patient supine in bed on entrance to room. Patient alert and agreeable to PT session.  ? ?Patient with no pain complaint throughout session. ? ?Therapeutic Activity: ?Bed Mobility: Patient performed supine --> sit with MinA and max cues for technique. Sit--. Supine at end of session with CGA/ MinA for LE to bed surface. Able to assist with positioning toward [facility]pleted MinA.  ? ?Transfers: Patient performed sit<>stand, stand pivot, and toilet transfers throughout session with up to [facility] Cent[facility] balance.  Provided verbal cues for technique and desired positioning throughout session.  ? ?Gait Training:  ?Patient ambulated 60'301 using RW with L hand saddle splint and CGA/ MinA for balance and L foot clearance/  full step through advancement. With fatigue pt continues to drag L foot. Provided vc/ tc for technique throughout. ? ?Wheelchair Mobility:  ?Patient propelled wheelchair in 50 foot increments in order to promote learning and addition of new techniques in order to maintain straight path, improve coordinated movements with RUE and BLE. Pt continues to use only UE or BLE and demos difficulty coordinatin both. Provided vc for hemitechnique throughout. Pt also requires continuous attention to placement of LLE and  allows progression of w/c past L foot placement. Continuous vc for timing of lift og L foot with increased heel strike and pull of L hamstring to promote forward propel.  ? ?Neuromuscular Re-ed: ?NMR facilitated during session with focus on LLE motor recruitment. Pt guided in continuous reciprocation of BLE using NuStep L3 x 10 min with focus on full extensor push of LLE for improved balance and upright posture/ improved L knee extension in stance and ambulation. NMR performed for improvements in motor control and coordination, balance, sequencing, judgement, and self confidence/ efficacy in performing all aspects of mobility at highest level of independence.  ? ?Patient supine  in bed at end of session with brakes locked, bed alarm set, and all needs within reach. ? ?Therapy Documentation ?Precautions:  ?Precautions ?Precautions: Fall ?Precaution Comments: L hemiparesis ?Restrictions ?Weight Bearing Restrictions: No ?General: ?  ?Vital Signs: ? ?Pain: ? No pain complaint during sessions.  ? ?Therapy/Group: Individual Therapy ? ?Alger Simons ?11/26/2021, 7:23 AM  ?

## 2021-11-27 NOTE — Patient Care Conference (Signed)
Inpatient RehabilitationTeam Conference and Plan of Care Update ?Date: 11/27/2021   Time: 10:23 AM  ? ? ?Patient Name: Elizabeth [name]      ?Medical Record Number: [id number]  ?Date of Birth: 1941-05-08 ?Sex: Female         ?Room/Bed: 3G18E/9H37J-69 ?Payor Info: Payor: Marine scientist / Plan: [facility] MEDICARE / Product Type: *No Product type* /   ? ?Admit Date/Time:  11/19/2021 12:34 PM ? ?Primary Diagnosis:  Right-sided lacunar stroke ([place] [facility]) ? ?Hospital Problems: Principal Problem: ?  Right-sided lacunar stroke [facility]) ? ? ? ?Expected Discharge Date: Expected Discharge Date: 12/11/21 ? ?Team Members Present: ?Physician leading conference: Dr. Alysia Penna ?Social Worker Present: Erlene Quan, BSW ?Nurse Present: Dorien Chihuahua, RN ?PT Present: Barrie Folk, PT ?OT Present: Providence Lanius, OT ?SLP Present: Sherren Kerns, SLP ?PPS Coordinator present : Gunnar Fusi, SLP ? ?   Current Status/Progress Goal Weekly Team Focus  ?Bowel/Bladder ? ? pt inc of b and b lbm 11/24/21  decrease inc episodes  assess q shift and prn   ?Swallow/Nutrition/ Hydration ? ? dys 2 diet (advanced 3/22), nectar thick liquids, min A for implementation of safe swallowing precautions  sup A  tolerance of current diet, ice chip trials, pharyngeal strengthening   ?ADL's ? ? S UBD, mod LBD, mod to max bathing seated shower level, mod toileting/ambulatory toilet transfer with RW/L saddle splint; L lean/poor trunk control in sitting, LUE brunstrum level III, hand IV  CGA overall  LUE NMR, self-care/balance/transfer retraining, pt/family/DME/AE education, activity tolerance   ?Mobility ? ? SUPER IMPULSIVE, wants to be independent, MinA for bed mobility, up to Mod A for transfers, Mod/ Max A for ambulation with +2 for w/c follow  overall CGA, MinA for steps  L sided NMR. Improved awareness of the L side.  Sitting balance. Tranfers. Gait. improved awareness of deficits for improved safety, family education   ?Communication ? ? min-to-mod A  to increase speech intelligiblity at the word and phrase level  sup A  speech strategies, awareness and repair of speech breakdowns   ?Safety/Cognition/ Behavioral Observations ? min-to-mod A  sup A  problem solving, day-to-day recall and compensatory memory strategies, emergent awareness   ?Pain ? ? no c/o pain         ?Skin ? ? no current skin break down         ? ? ?Discharge Planning:  ?Patient discharging home with spouse, daughter and granddaughters to assist. 24/7 supervision avaliable   ?Team Discussion: ?Patient with congested, productive cough; CXR ordered. Reports urinary urgency with incontinence; checking for UTI. Progression limited by fatigue and impulsivity with resistance to assistance along with internal distraction and left inattention, poor memory.  ?Patient on target to meet rehab goals: ?yes, currently needs supervision for upper body care and mod assist for lower body dressing. Completes shower transfers with mod assist and toileting using saddle splint. Needs min mod assist for stand pivot transfers and min assist for ambulation due to left lean.  Currently on D2 Nectar; cues for speed. SLP goals set for supervision and currently min - mod assist for problem solving and memory.  ? ?*See Care Plan and progress notes for long and short-term goals.  ? ?Revisions to Treatment Plan:  ?N/A ?  ?Teaching Needs: ?Safety, transfers, toileting, medications, secondary risk management, etc  ?Current Barriers to Discharge: ?Decreased caregiver support and Home enviroment access/layout ? ?Possible Resolutions to Barriers: ?Family education ?HH follow up services ?  ? ? Medical Summary ?  Current Status: increasing cough with sputum, incont of bowel and bladder at times, urinary freq , + UA ? Barriers to Discharge: Medical stability;IV antibiotics ? Barriers to Discharge Comments: may need IV abx based on Ucx and CXR ?Possible Resolutions to [employer]: have ordered CXR, Urine cult, start antibiotic  once cx obtained ? ? ?Continued Need for Acute Rehabilitation Level of Care: The patient requires daily medical management by a physician with specialized training in physical medicine and rehabilitation for the following reasons: ?Direction of a multidisciplinary physical rehabilitation program to maximize functional independence : Yes ?Medical management of patient stability for increased activity during participation in an intensive rehabilitation regime.: Yes ?Analysis of laboratory values and/or radiology reports with any subsequent need for medication adjustment and/or medical intervention. : Yes ? ? ?I attest that I was present, lead the team conference, and concur with the assessment and plan of the team. ? ? ?Dorien Chihuahua B ?11/27/2021, 5:25 PM  ? ? ? ? ? ? ?

## 2021-11-27 NOTE — Progress Notes (Signed)
Physical Therapy Weekly Progress Note ? ?Patient Details  ?Name: Elizabeth [name] ?MRN: [id number] ?Date of Birth: 1941/02/08 ? ?Beginning of progress report period: November 20, 2021 ?End of progress report period: November 27, 2021 ? ?Today's Date: 11/27/2021 ? ?   ? ?Patient has met 3 of 4 short term goals.  Pt is making steady progress towards goal of CGA-supervision assist. L inattention, impulsivity, and L sided hemiplegia are greatest barriers to progress at this point. Pt has progressed to min-mod assist for transfers and gait with max cues for attention to the LLE and LUE. Husband will plan to initiate family education over the next week. ? ?Patient continues to demonstrate the following deficits muscle weakness and muscle paralysis, decreased cardiorespiratoy endurance, impaired timing and sequencing, unbalanced muscle activation, motor apraxia, and decreased coordination, decreased attention to left, decreased attention, decreased awareness, decreased problem solving, decreased safety awareness, decreased memory, and delayed processing, and decreased sitting balance, decreased standing balance, decreased postural control, hemiplegia, and decreased balance strategies and therefore will continue to benefit from skilled PT intervention to increase functional independence with mobility. ? ?Patient progressing toward long term goals..  Continue plan of care. ? ?PT Short Term Goals ?Week 1:  PT Short Term Goal 1 (Week 1): Pt will transfer to [facility] with min assist and LRAD ?PT Short Term Goal 1 - Progress (Week 1): Met ?PT Short Term Goal 2 (Week 1): Pt will ambulate with RW x 18f with mod assist ?PT Short Term Goal 2 - Progress (Week 1): Met ?PT Short Term Goal 3 (Week 1): Pt will propell WC 1075fwith min assist ?PT Short Term Goal 3 - Progress (Week 1): Met ?Week 2:  PT Short Term Goal 1 (Week 2): Pt will perform bed mobiltiy with min assist consistently ?PT Short Term Goal 2 (Week 2): Pt will ambulate 7535fith min assist  consistently ?PT Short Term Goal 3 (Week 2): [facility] ?PT Short Term Goal 4 (Week 2): Pt will ascend 4 steps with min assist and rails as needed ? ?Skilled Therapeutic Interventions/Progress Updates:  ? ?PT attempted to see pt for treatment. RN present attempting to perform urinary catheterization. PT returned to attempt therapy and Pt off unit for Xrays. Will re-attempt at later time/date, provided pt in medically appropriate ?   ? ?Therapy Documentation ?Precautions:  ?Precautions ?Precautions: Fall ?Precaution Comments: L hemiparesis ?Restrictions ?Weight Bearing Restrictions: No ?Missed time: 60 min- off unit for xray and RN care  ? ?Therapy/Group: Individual Therapy ? ?AusLorie Phenix/22/2023, 11:53 AM  ?

## 2021-11-27 NOTE — Progress Notes (Signed)
Patient ID: LESLYN MONDA, female   DOB: June 02, 1941, 81 y.o.   MRN: [id number] ?Team Conference Report to Patient/Family ? ?Team Conference discussion was reviewed with the patient and caregiver, including goals, any changes in plan of care and target discharge date.  Patient and caregiver express understanding and are in agreement.  The patient has a target discharge date of 12/11/21. ? ?SW met with patient and spouse provided conference updates. Patient and spouse concerned because patient is still receiving items she does not prefer for breakfast/lunch and dinner. Spouse requesting to bring in personal soup for patient, this was approved by SLP.  ? ?Dyanne Iha ?11/27/2021, 1:30 PM  ?

## 2021-11-27 NOTE — Progress Notes (Signed)
Speech Language Pathology Weekly Progress and Session Note ? ?Patient Details  ?Name: Elizabeth [name] ?MRN: [id number] ?Date of Birth: 10/03/1940 ? ?Beginning of progress report period: November 20, 2021 ?End of progress report period: November 27, 2021 ? ?Today's Date: 11/27/2021 ?SLP Individual Time: 9357-0177 ?SLP Individual Time Calculation (min): 55 min ? ?Short Term Goals: ?Week 1: SLP Short Term Goal 1 (Week 1): Patient will consume current diet without overt s/sx of aspiration with min A verbal cues for use of swallowing compensatory strategies ?SLP Short Term Goal 1 - Progress (Week 1): Progressing toward goal ?SLP Short Term Goal 2 (Week 1): Patient will consume ice chips/thin liquid trials with min overt s/sx of aspiration to indicate readiness for MBS. ?SLP Short Term Goal 2 - Progress (Week 1): Discontinued (comment) (Goal discontinued to focus on tolerance of current diet with implementation of swallowing strategies at this time) ?SLP Short Term Goal 3 (Week 1): Patient will utilize speech intelligiblity strategies at the word and phrase level with min-to-mod A cues to achieve 75%+ intelligiblity ?SLP Short Term Goal 3 - Progress (Week 1): Met ?SLP Short Term Goal 4 (Week 1): Patient will complete mildly complex problem solving tasks with min-to-mod A verbal/visual cues ?SLP Short Term Goal 4 - Progress (Week 1): Met ?SLP Short Term Goal 5 (Week 1): Patient will demonstrate awareness to errors during functional and novel tasks with min-to-mod A verbal/visual cues ?SLP Short Term Goal 5 - Progress (Week 1): Met ?SLP Short Term Goal 6 (Week 1): Patient will recall and implement up to 2 beneficial compensatory memory strategies to recall new, daily information with min-to-mod A verbal and question cues ?SLP Short Term Goal 6 - Progress (Week 1): Progressing toward goal ? ?New Short Term Goals: ?Week 2: SLP Short Term Goal 1 (Week 2): Patient will consume current diet without overt s/sx of aspiration with min A verbal  cues for use of swallowing compensatory strategies ?SLP Short Term Goal 2 (Week 2): Patient will utilize speech intelligiblity strategies at the sentence level with min A  cues to achieve 75%+ intelligiblity ?SLP Short Term Goal 3 (Week 2): Patient will complete mildly complex problem solving tasks with min A verbal/visual cues ?SLP Short Term Goal 4 (Week 2): Patient will demonstrate awareness to errors during functional and novel tasks with min A verbal/visual cues ?SLP Short Term Goal 5 (Week 2): Patient will recall and implement up to 2 beneficial compensatory memory strategies to recall new, daily information with min-to-mod A verbal and question cues ? ?Weekly Progress Updates: Patient has made slow gains and has met 3 of 6 STGs this reporting period. Patient is currently completing cognitive tasks with overall min-to-mod A verbal and visual cues to complete functional and complex tasks accurately and safely. SLP continues to address complex problem solving, short-term memory, and emergent awareness. Patient is currently consuming a dys 2 diet and nectar thick liquids with mod A cues to reduce rate of consumption and bolus size. Pt exhibits intermittent s/sx of aspiration with nectar thick liquids when observed taking large, sequential sips and symptoms appear minimized with strict adherence to swallowing precautions including small and controlled sips. Ice chip and thin liquid PO trials have been completed however patient exhibited significant overt s/sx of aspiration and thus does not appear to be an appropriate candidate for repeat MBS for possible diet advancement. SLP continuing to assess tolerance at bedside as well as facilitate pharyngeal strengthening exercises. Patient is currently requiring min-to-mod A verbal cues for implementation  of speech intelligibility strategies at the word and phrase level to achieve 75% intelligibly in known contexts. Patient and family education is ongoing. Patient would  benefit from continued skilled SLP intervention to maximize cognitive, speech, and swallow functioning. ?  ?Intensity: Minumum of 1-2 x/day, 30 to 90 minutes ?Frequency: 3 to 5 out of 7 days ?Duration/Length of Stay: 4/5 ?Treatment/Interventions: Cognitive remediation/compensation;Internal/external aids;Speech/Language facilitation;Cueing hierarchy;Dysphagia/aspiration precaution training;Functional tasks;Patient/family education;Therapeutic Activities ? ?Daily Session ?Skilled Therapeutic Interventions: Skilled ST treatment focused on swallowing and cognitive goals. Patient seen for consumption of morning meal and therapeutic PO trials with dys 2 textures. Patient placed upper denture with adhesive prior to meal. Denture w/ very loose fit without adhesive. Patient consumed pureed textures with mild anterior labial spillage on left, trace oral residuals, and with improved AP transit as compared to yesterday's session in late afternoon. Pt required mod A verbal cues to reduce bolus size and to take single sips of liquid. Pt exhibited no overt s/sx of aspiration with dys 1 and nectar thick liquids during meal. She exhibited one instance of explosive coughing following meal with nectar thick liquid by straw and suspect this may have attributed to large and sequential sips. Patient consumed dys 2 textures with mildly prolonged mastication, trace oral residuals (similar to puree), and no overt s/sx of aspiration. SLP recommends diet advancement to dys 2 textures at this time. Continue nectar thick liquids, full supervision during meals. Pt completed oral care at sink with set-up A and supervision for performing chin tuck to rinse and spit water in effort to reduce possible premature spillage 2/2 decreased oral strength and control. Patient appears appropriate to perform oral care at sink with normal toothbrush/toothpaste vs. using suction swab as previously used. Updated OT and discussed strategies. Patient benefited  from a low stim environment to increase attention to task, including shutting door to reduce noise from hallway, and turning off television. Patient was left in wheelchair with alarm activated and immediate needs within reach at end of session. Continue per current plan of care.      ?General  ?  ?Pain ?Pain Assessment ?Pain Scale: 0-10 ?Pain Score: 0-No painNone ? ?Therapy/Group: Individual Therapy ? ? T  ?11/27/2021, 4:20 PM ? ? ? ? ? ? ?

## 2021-11-28 DIAGNOSIS — N390 Urinary tract infection, site not specified: Secondary | ICD-10-CM | POA: Diagnosis not present

## 2021-11-28 DIAGNOSIS — I6381 Other cerebral infarction due to occlusion or stenosis of small artery: Secondary | ICD-10-CM | POA: Diagnosis not present

## 2021-11-28 DIAGNOSIS — A499 Bacterial infection, unspecified: Secondary | ICD-10-CM

## 2021-11-28 DIAGNOSIS — I10 Essential (primary) hypertension: Secondary | ICD-10-CM | POA: Diagnosis not present

## 2021-11-28 LAB — GLUCOSE, CAPILLARY
Glucose-Capillary: 112 mg/dL — ABNORMAL HIGH (ref 70–99)
Glucose-Capillary: 123 mg/dL — ABNORMAL HIGH (ref 70–99)
Glucose-Capillary: 142 mg/dL — ABNORMAL HIGH (ref 70–99)
Glucose-Capillary: 135 mg/dL — ABNORMAL HIGH (ref 70–99)

## 2021-11-28 NOTE — Progress Notes (Signed)
Speech Language Pathology Daily Session Note ? ?Patient Details  ?Name: Elizabeth [name] ?MRN: [id number] ?Date of Birth: 1940/11/01 ? ?Today's Date: 11/28/2021 ?SLP Individual Time: 4782-9562 ?SLP Individual Time Calculation (min): 45 min ? ?Short Term Goals: ?Week 2: SLP Short Term Goal 1 (Week 2): Patient will consume current diet without overt s/sx of aspiration with min A verbal cues for use of swallowing compensatory strategies ?SLP Short Term Goal 2 (Week 2): Patient will utilize speech intelligiblity strategies at the sentence level with min A  cues to achieve 75%+ intelligiblity ?SLP Short Term Goal 3 (Week 2): Patient will complete mildly complex problem solving tasks with min A verbal/visual cues ?SLP Short Term Goal 4 (Week 2): Patient will demonstrate awareness to errors during functional and novel tasks with min A verbal/visual cues ?SLP Short Term Goal 5 (Week 2): Patient will recall and implement up to 2 beneficial compensatory memory strategies to recall new, daily information with min-to-mod A verbal and question cues ? ?Skilled Therapeutic Interventions: Skilled ST treatment focused on swallowing and speech goals. Chest xray taken yesterday secondary to productive cough. Results were unremarkable and both lungs clear. Patient consumed nectar thick liquids by x7 with good oral containment, suspect delayed swallow initiation, and delayed cough x1. Patient requiring intermittent verbal cues to take one sip at a time. Patient consumed honey thick liquids by cup with moderate anterior spillage on left, decreased AP transit, and without overt s/sx of aspiration. NT reported patient tolerated dys 2 diet and nectar thick liquids with no evidence of coughing during both breakfast and lunch. Pt continues to exhibit intermittent cough without presence of PO intake. SLP recommends continuation of current diet and full supervision for all intake. Patient executed chin tuck against resistance with mod fading to min A  verbal cues for effectiveness. Pt completed 3 sets of 10. Patient required mod A verbal cues to implement speech strategies at the sentence level to achieve 50-75% intelligible. Pt appeared fatigued which suspect attributed to decreased intelligibility this date. Patient was left in bed with alarm activated and immediate needs within reach at end of session. Continue per current plan of care.   ?   ?Pain ?Pain Assessment ?Pain Scale: 0-10 ?Pain Score: 0-No pain ? ?Therapy/Group: Individual Therapy ? ?Novalynn Branaman T Mohsen Odenthal ?11/28/2021, 2:31 PM ?

## 2021-11-28 NOTE — Progress Notes (Signed)
?                                                       PROGRESS NOTE ? ? ?Subjective/Complaints: ?Still c/o nasal congestion. Otherwise doing fairly. Using nasal sprays ? ?ROS: Patient denies fever, rash, sore throat, blurred vision, dizziness, nausea, vomiting, diarrhea, cough, shortness of breath or chest pain, joint or back/neck pain, headache, or mood change.  ? ? ? ?Objective: ?  ?DG Chest 2 View ? ?Result Date: 11/27/2021 ?CLINICAL DATA:  Cough. EXAM: CHEST - 2 VIEW COMPARISON:  Chest x-ray dated November 19, 2021. FINDINGS: The heart size and mediastinal contours are within normal limits. Both lungs are clear. The visualized skeletal structures are unremarkable. IMPRESSION: No active cardiopulmonary disease. Electronically Signed   By: Titus Dubin M.D.   On: 11/27/2021 11:26   ?Recent Labs  ?  11/26/21 ?1540  ?WBC 4.8  ?HGB 11.7*  ?HCT 35.8*  ?PLT 249  ? ?Recent Labs  ?  11/26/21 ?0867  ?NA 135  ?K 4.4  ?CL 102  ?CO2 25  ?GLUCOSE 98  ?BUN 17  ?CREATININE 0.72  ?CALCIUM 9.5  ? ? ?Intake/Output Summary (Last 24 hours) at 11/28/2021 1054 ?Last data filed at 11/28/2021 0830 ?Gross per 24 hour  ?Intake 440 ml  ?Output 1 ml  ?Net 439 ml  ?  ? ?  ? ?Physical Exam: ?Vital Signs ?Blood pressure (!) 143/63, pulse (!) 53, temperature 97.8 ?F (36.6 ?C), temperature source Oral, resp. rate 18, height '5\' 2"'$  (1.575 m), weight 63.4 kg, SpO2 97 %. ? ?Constitutional: No distress . Vital signs reviewed. obese ?HEENT: NCAT, EOMI, oral membranes moist ?Neck: supple ?Cardiovascular: RRR without murmur. No JVD    ?Respiratory/Chest: CTA Bilaterally without wheezes or rales. Normal effort    ?GI/Abdomen: BS +, non-tender, non-distended ?Ext: no clubbing, cyanosis, or edema ?Psych: pleasant and cooperative  ?Skin: No evidence of breakdown, no evidence of rash ?Neurologic: reasonable insight and awareness. No language issues.  Cranial nerves II through XII intact, motor strength is 5/5 in RIght and 4-/5 left deltoid, bicep, tricep,  grip, hip flexor, knee extensors, ankle dorsiflexor and plantar flexor--stable motor exam. ?Sensory exam normal for light touch and pain in all 4 limbs. No limb ataxia or cerebellar signs. No abnormal tone appreciated.   ?Musculoskeletal: Full range of motion in all 4 extremities. No joint swelling ? ? ? ?Assessment/Plan: ?1. Functional deficits which require 3+ hours per day of interdisciplinary therapy in a comprehensive inpatient rehab setting. ?Physiatrist is providing close team supervision and 24 hour management of active medical problems listed below. ?Physiatrist and rehab team continue to assess barriers to discharge/monitor patient progress toward functional and medical goals ? ?Care Tool: ? ?Bathing ?   ?Body parts bathed by patient: Left arm, Chest, Abdomen, Right upper leg, Left upper leg, Right lower leg, Left lower leg, Face  ? Body parts bathed by helper: Right arm ?  ?  ?Bathing assist Assist Level: Moderate Assistance - Patient 50 - 74% ?  ?  ?Upper Body Dressing/Undressing ?Upper body dressing   ?What is the patient wearing?: Pull over shirt ?   ?Upper body assist Assist Level: Supervision/Verbal cueing ?   ?Lower Body Dressing/Undressing ?Lower body dressing ? ? ?   ?What is the patient wearing?: Underwear/pull up ? ?  ? ?  Lower body assist Assist for lower body dressing: Moderate Assistance - Patient 50 - 74% ?   ? ?Toileting ?Toileting Toileting Activity did not occur (Probation officer and hygiene only): N/A (no void or bm)  ?Toileting assist Assist for toileting: Moderate Assistance - Patient 50 - 74% ?  ?  ?Transfers ?Chair/bed transfer ? ?Transfers assist ?   ? ?Chair/bed transfer assist level: Maximal Assistance - Patient 25 - 49% ?  ?  ?Locomotion ?Ambulation ? ? ?Ambulation assist ? ? Ambulation activity did not occur: Safety/medical concerns ? ?  ?  ?   ? ?Walk 10 feet activity ? ? ?Assist ? Walk 10 feet activity did not occur: Safety/medical concerns ? ?  ?   ? ?Walk 50 feet  activity ? ? ?Assist Walk 50 feet with 2 turns activity did not occur: Safety/medical concerns ? ?  ?   ? ? ?Walk 150 feet activity ? ? ?Assist Walk 150 feet activity did not occur: Safety/medical concerns ? ?  ?  ?  ? ?Walk 10 feet on uneven surface  ?activity ? ? ?Assist Walk 10 feet on uneven surfaces activity did not occur: Safety/medical concerns ? ? ?  ?   ? ?Wheelchair ? ? ? ? ?Assist Is the patient using a wheelchair?: Yes ?Type of Wheelchair: Manual ?  ? ?Wheelchair assist level: Dependent - Patient 0% ?Max wheelchair distance: 150  ? ? ?Wheelchair 50 feet with 2 turns activity ? ? ? ?Assist ? ?  ?  ? ? ?Assist Level: Dependent - Patient 0%  ? ?Wheelchair 150 feet activity  ? ? ? ?Assist ?   ? ? ?Assist Level: Dependent - Patient 0%  ? ?Blood pressure (!) 143/63, pulse (!) 53, temperature 97.8 ?F (36.6 ?C), temperature source Oral, resp. rate 18, height '5\' 2"'$  (1.575 m), weight 63.4 kg, SpO2 97 %. ? ?Medical Problem List and Plan: ?1. 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            -patient may shower ?            -ELOS/Goals: 14-20 days, min assist to supervision goals, team conf in am  ? -Continue CIR therapies including PT, OT  ?2.  Antithrombotics: ?-DVT/anticoagulation:  Pharmaceutical: Lovenox ?            -antiplatelet therapy: Plavix and aspirin ?3. Pain Management: Tylenol ?4. Mood: LCSW to evaluate and provide emotional support ?            -antipsychotic agents: Not applicable ?5. Neuropsych: This patient is capable of making decisions on her own behalf. ?6. Skin/Wound Care: Routine skin care checks ?7. Fluids/Electrolytes/Nutrition: Routine I's and O's and follow-up chemistries ?Dysphagia , MBS performed at OSH ?            -- D2/nectar diet currently ?8: DM-2: carb modified diet ?--CBGs and SSI (no home meds) ?CBG (last 3)  ?Recent Labs  ?  11/27/21 ?1615 11/27/21 ?2146 11/28/21 ?0608   ?GLUCAP 143* 105* 112*  ?Controlled 3/23--continue with diet control ? ?9: Seasonal allergies: Continue symptomatic relief as needed ?10: GERD: Continue Protonix ?11: COPD: Continue albuterol nebs as needed (no home meds)- smoking hx  ?            --no supplemental O2 needs ?12: Hypokalemia: resolved with supplementation>>>continue and follow-up BMP ? ?  Latest Ref Rng & Units 11/26/2021  ?  5:26  AM 11/20/2021  ?  5:29 AM 08/26/2021  ?  8:45 AM  ?BMP  ?Glucose 70 - 99 mg/dL 98   111   179    ?BUN 8 - 23 mg/dL '17   20   11    '$ ?Creatinine 0.44 - 1.00 mg/dL 0.72   0.69   0.78    ?BUN/Creat Ratio 12 - 28   14    ?Sodium 135 - 145 mmol/L 135   133   135    ?Potassium 3.5 - 5.1 mmol/L 4.4   4.0   3.6    ?Chloride 98 - 111 mmol/L 102   99   99    ?CO2 22 - 32 mmol/L '25   24   21    '$ ?Calcium 8.9 - 10.3 mg/dL 9.5   9.6   9.9    ?Recheck on 3/21 normal ==f/u next week ? ?13: Hyperlipidemia: intolerant of all statins (no other alternatives listed on home meds) ?14: Hypertension: Controlled off home amlodipine ?             ?Vitals:  ? 11/27/21 2047 11/28/21 0457  ?BP: (!) 142/61 (!) 143/63  ?Pulse: 61 (!) 53  ?Resp: 20 18  ?Temp: 97.9 ?F (36.6 ?C) 97.8 ?F (36.6 ?C)  ?SpO2: 96% 97%  ? Uncontrolled but improving 3/22 resume amlodipine started '5mg'$  on 3/20, monitor for 2-3 d prior to dose adjustment  ? 3/23 improving control ?15: Chronic right knee pain: will monitor how this affects therapy , no NSAIDs due to CVA , may use tylenol  ? 16.  Neurogenic bladder - spastic , due to CVA ,  ? -ua +,empiric keflex started, UCX pending ? -consider antispasmodic potentially ? ? ?LOS: ?9 days ?A FACE TO FACE EVALUATION WAS PERFORMED ? ?Meredith Staggers ?11/28/2021, 10:54 AM  ? ? ? ?

## 2021-11-28 NOTE — Progress Notes (Signed)
Occupational Therapy Session Note ? ?Patient Details  ?Name: Elizabeth [name] ?MRN: [id number] ?Date of Birth: 12/02/1940 ? ?Today's Date: 11/28/2021 ?OT Missed Time: 60 Minutes ?Missed Time Reason: Patient fatigue ? ? ?Short Term Goals: ?Week 1:  OT Short Term Goal 1 (Week 1): Pt will maintain balance seated EOB > 8 min with no more than CGA in prep for seated ADL. ?OT Short Term Goal 1 - Progress (Week 1): Met ?OT Short Term Goal 2 (Week 1): Pt will complete toilet transfer with max A and LRAD. ?OT Short Term Goal 2 - Progress (Week 1): Met ?OT Short Term Goal 3 (Week 1): Pt will complete LBD with max A and LRAD. ?OT Short Term Goal 3 - Progress (Week 1): Met ?OT Short Term Goal 4 (Week 1): Pt will don shirt with mod A. ?OT Short Term Goal 4 - Progress (Week 1): Met ?Week 2:  OT Short Term Goal 1 (Week 2): Pt will don pants with min A and AE PRN. ?OT Short Term Goal 2 (Week 2): Pt will complete ambulatory toilet transfer with min A. ?OT Short Term Goal 3 (Week 2): Pt will complete standing grooming task with no more than CGA. ? ?Skilled Therapeutic Interventions/Progress Updates:  ?  Session 1 Attempt: Attempted to see pt for scheduled OT time, pt received asleep in bed. Minimally responsive despite tactile/verbal cuing and modifying environment to promote arousal. Pt declining therapy partiipation at this time in order to continue sleeping. Pt left semi-reclined in bed, bed alarm engaged, call bell in reach. Pt missed 60 min of OT due to fatigue. ? ? ? ?Volanda Napoleon MS, OTR/L ? ?11/28/2021, 6:53 AM ?

## 2021-11-28 NOTE — Progress Notes (Signed)
Physical Therapy Session Note ? ?Patient Details  ?Name: Elizabeth [name] ?MRN: [id number] ?Date of Birth: 1940-12-16 ? ?Today's Date: 11/28/2021 ?PT Individual Time: 4403-4742 ?PT Individual Time Calculation (min): 54 min  ? ?Short Term Goals: ?Week 1:  PT Short Term Goal 1 (Week 1): Pt will transfer to [facility] with min assist and LRAD ?PT Short Term Goal 1 - Progress (Week 1): Met ?PT Short Term Goal 2 (Week 1): Pt will ambulate with RW x 44f with mod assist ?PT Short Term Goal 2 - Progress (Week 1): Met ?PT Short Term Goal 3 (Week 1): Pt will propell WC 1041fwith min assist ?PT Short Term Goal 3 - Progress (Week 1): Met ?Week 2:  PT Short Term Goal 1 (Week 2): Pt will perform bed mobiltiy with min assist consistently ?PT Short Term Goal 2 (Week 2): Pt will ambulate 759fith min assist consistently ?PT Short Term Goal 3 (Week 2): Pt [facility] ?PT Short Term Goal 4 (Week 2): Pt will ascend 4 steps with min assist and rails as needed ? ? ?Skilled Therapeutic Interventions/Progress Updates:  ? ?Pt received supine in bed and agreeable to PT. Supine>sit transfer with min assist and cues for sequencing of LUE/LLE and use of bed featurs to push into sitting from semireclined.  ? ?Stand pivot transfer to WC [facility] the L with no AD and mod assist to prevent Lateral LOB  ? ?Pregait reciprocal stepping to tap BLE on2 inch step with BUE support on RW. Visual cues through use of mirror to visualize adequate weight shift to the R and allow improved step length on the LLE.   ?Gait training with RW x 120f100fth min-mod assist from PT with moderate-max cues for awareness of midline disorientation throughout gait training. Visual cue added to improve step width on the LLE.  ? ?Dynamci gait training in parallel bars side stepping and forward/reverse. 3 x 8ft 48fh with min assist from PT and moderate cues for use of visual feedback from mirror and tactile feedback of weight shift onto bar on the Right with LLE  advancement forward/reverse.  ? ?Pt returned to room and performed stand pivot transfer to bed with RW with bed on the R and min assist for safety. Sit>supine completed with min assist on the LLE, and left supine in bed with call bell in reach and all needs met.  ? ? ? ?   ? ?Therapy Documentation ?Precautions:  ?Precautions ?Precautions: Fall ?Precaution Comments: L hemiparesis ?Restrictions ?Weight Bearing Restrictions: No ? ?Vital Signs: ?Therapy Vitals ?Temp: 98.1 ?F (36.7 ?C) ?Temp Source: Oral ?Pulse Rate: 60 ?Resp: 16 ?BP: 130/82 ?Oxygen Therapy ?SpO2: 97 % ?Pain: ?Pain Assessment ?Pain Scale: 0-10 ?Pain Score: 0-No pain ? ? ? ? ?Therapy/Group: Individual Therapy ? ?AustiLorie Phenix3/2023, 4:14 PM  ?

## 2021-11-28 NOTE — Progress Notes (Signed)
Physical Therapy Session Note ? ?Patient Details  ?Name: Elizabeth [name] ?MRN: [id number] ?Date of Birth: June 27, 1941 ? ?Today's Date: 11/28/2021 ?PT Individual Time: 1427 -0906 PT Individual Time Calculation (min): 28 min  ?Short Term Goals: ?Week 2:  PT Short Term Goal 1 (Week 2): Pt will perform bed mobiltiy with min assist consistently ?PT Short Term Goal 2 (Week 2): Pt will ambulate 79f with min assist consistently ?PT Short Term Goal 3 (Week 2): [facility] ?PT Short Term Goal 4 (Week 2): Pt will ascend 4 steps with min assist and rails as needed ? ?Skilled Therapeutic Interventions/Progress Updates: Pt presented in bed with husband present agreeable to therapy. Pt states pain in LLE, no intervention requested and rest breaks provided as needed. Pt performed supine to sit with modA and multimodal cues. Upon sitting pt with significant L lateral lean which pt was unaware to correct. PTA threaded pants total A however required increased time due to pt's delayed responses in correcting lateral lean with multimodal cues and occasionally at time nearly hitting footboard. Pt performed Sit to stand with minA and cues for hand placement to allow PTA to pull pants over hips. Pt required modA for doffing sweatshirt and donning clean sweatshirt. Pt then ambulated ~181fin room with modA and max multimodal cues due to heavy L lateral lean, poor foot placement, and LLE inattention. PTA would stop pt intermittently to correct lean and foot placement. Pt ambulated to w/c and left in w/c at end of session with belt alarm on, call bell within reach and current needs met.  ?   ? ?Therapy Documentation ?Precautions:  ?Precautions ?Precautions: Fall ?Precaution Comments: L hemiparesis ?Restrictions ?Weight Bearing Restrictions: No ?General: ?  ?Vital Signs: ?Therapy Vitals ?Temp: 98.1 ?F (36.7 ?C) ?Temp Source: Oral ?Pulse Rate: 60 ?Resp: 16 ?BP: 130/82 ?Oxygen Therapy ?SpO2: 97 % ?Pain: ?Pain  Assessment ?Pain Scale: 0-10 ?Pain Score: 0-No pain ?Mobility: ?  ?Locomotion : ?   ?Trunk/Postural Assessment : ?   ?Balance: ?  ?Exercises: ?  ?Other Treatments:   ? ? ? ?Therapy/Group: Individual Therapy ? ?Markhi Kleckner ?11/28/2021, 4:16 PM  ?

## 2021-11-29 DIAGNOSIS — I6381 Other cerebral infarction due to occlusion or stenosis of small artery: Secondary | ICD-10-CM | POA: Diagnosis not present

## 2021-11-29 DIAGNOSIS — A499 Bacterial infection, unspecified: Secondary | ICD-10-CM | POA: Diagnosis not present

## 2021-11-29 DIAGNOSIS — N390 Urinary tract infection, site not specified: Secondary | ICD-10-CM | POA: Diagnosis not present

## 2021-11-29 DIAGNOSIS — I10 Essential (primary) hypertension: Secondary | ICD-10-CM | POA: Diagnosis not present

## 2021-11-29 LAB — URINE CULTURE: Culture: >=100000 — AB

## 2021-11-29 LAB — GLUCOSE, CAPILLARY
Glucose-Capillary: 130 mg/dL — ABNORMAL HIGH (ref 70–99)
Glucose-Capillary: 111 mg/dL — ABNORMAL HIGH (ref 70–99)
Glucose-Capillary: 113 mg/dL — ABNORMAL HIGH (ref 70–99)
Glucose-Capillary: 127 mg/dL — ABNORMAL HIGH (ref 70–99)

## 2021-11-29 NOTE — Progress Notes (Signed)
Occupational Therapy Session Note ? ?Patient Details  ?Name: Elizabeth [name] ?MRN: [id number] ?Date of Birth: [birth year] ? ?Today's Date: 11/29/2021 ?OT Individual Time: 1655-3748 ?OT Individual Time Calculation (min): 36 min  ? ? ?Short Term Goals: ?Week 1:  OT Short Term Goal 1 (Week 1): Pt will maintain balance seated EOB > 8 min with no more than CGA in prep for seated ADL. ?OT Short Term Goal 1 - Progress (Week 1): Met ?OT Short Term Goal 2 (Week 1): Pt will complete toilet transfer with max A and LRAD. ?OT Short Term Goal 2 - Progress (Week 1): Met ?OT Short Term Goal 3 (Week 1): Pt will complete LBD with max A and LRAD. ?OT Short Term Goal 3 - Progress (Week 1): Met ?OT Short Term Goal 4 (Week 1): Pt will don shirt with mod A. ?OT Short Term Goal 4 - Progress (Week 1): Met ?Week 2:  OT Short Term Goal 1 (Week 2): Pt will don pants with min A and AE PRN. ?OT Short Term Goal 2 (Week 2): Pt will complete ambulatory toilet transfer with min A. ?OT Short Term Goal 3 (Week 2): Pt will complete standing grooming task with no more than CGA. ? ?Skilled Therapeutic Interventions/Progress Updates:  ?  Pt received semi-reclined in bed, agreeable to therapy. Session focus on transfer retraining, activity tolerance, LUE NMR in prep for improved ADL/IADL/func mobility performance + decreased caregiver burden. ?Came to sitting EOB with mod A to progress LLE off bed and to lift trunk.  Short ambulatory transfer > w/c with min A to manage RW/power up from bed. Total A w/c transport to and from gym.  ? ?Pt completed 3x10 of the following: forward and circle towel slides, forearm supination/pronation with ball, and volley ball toss with use of rhythm stick and ace wrap to L hand. Cues throughout to attend to L hand, pt often not aware of her L hand slipping off towel and continues therex with only R hand. Reports L elbow pain from staff grabbing her UE, encouraged pt to self-advocate and to manage LUE herself if possible. ? ?Pt left  seated in w/c with safety belt alarm engaged, call bell in reach, and all immediate needs met.  ? ? ?Therapy Documentation ?Precautions:  ?Precautions ?Precautions: Fall ?Precaution Comments: L hemiparesis ?Restrictions ?Weight Bearing Restrictions: No ? ?Pain: ? See session note ?ADL: See Care Tool for more details. ?Therapy/Group: Individual Therapy ? ?Volanda Napoleon MS, OTR/L ? ?11/29/2021, 6:56 AM ?

## 2021-11-29 NOTE — Progress Notes (Signed)
Physical Therapy Session Note ? ?Patient Details  ?Name: Elizabeth [name] ?MRN: [id number] ?Date of Birth: 12/26/1940 ? ?Today's Date: 11/29/2021 ?PT Individual Time: 2993-7169 ?PT Individual Time Calculation (min): 56 min  ? ?Short Term Goals: ?Week 1:  PT Short Term Goal 1 (Week 1): Pt will transfer to [facility] with min assist and LRAD ?PT Short Term Goal 1 - Progress (Week 1): Met ?PT Short Term Goal 2 (Week 1): Pt will ambulate with RW x 3f with mod assist ?PT Short Term Goal 2 - Progress (Week 1): Met ?PT Short Term Goal 3 (Week 1): Pt will propell WC 1030fwith min assist ?PT Short Term Goal 3 - Progress (Week 1): Met ?Week 2:  PT Short Term Goal 1 (Week 2): Pt will perform bed mobiltiy with min assist consistently ?PT Short Term Goal 2 (Week 2): Pt will ambulate 7595fith min assist consistently ?PT Short Term Goal 3 (Week 2): Pt [facility] ?PT Short Term Goal 4 (Week 2): Pt will ascend 4 steps with min assist and rails as needed ? ?Skilled Therapeutic Interventions/Progress Updates:  ?Patient supine in bed on entrance to room. Patient alert and agreeable to PT session.  ? ?Patient with no pain complaint throughout session. ? ?Therapeutic Activity: ?Bed Mobility: Patient performed supine --> sit with supervision/ CGA. VC/ tc required for completion of task, technique, and balance. Pt dons pants and shoes while seated EOB with improved seated balance over previous session with therapist. No LOB backwards or to L side and able to correct with vc from therapist. ?Transfers: Patient performed sit<>stand and stand pivot transfers throughout session with CGA and up to ModRooseveltr LOB. Provided verbal cues for balance and technique throughout session. ? ?Ambulatory toilet transfer performed with minA to reach toilet. ModA for clothing mgmt, supervision for pericare. On stance from toilet to don clothing, pt melts to L and anteriorly with LOB requiring ModA to maintain upright stance.  ? ?Gait  Training/ NMR:  ?2# aw donned to pt's LLE for forced use and error augmentation to improve foot clearance during pivot stepping and ambulation. Pt guided in stepping fwd and bkwd with good foot clearance using // bars for UE support. VC provided for adequate hip/ knee flexion and heel strike for fwd ambulation/ toe strike for bkwd. Pt does well with 2# aw and progressed with ankle weight with varied resistance applied by therapist for forward stepping, and with resistance from red t-band in backward stepping. Good performance with vc throughout.  ? ?Patient ambulated back to room with 2# aw half of distance and no aw for remaining distance. Reaches total of 160 ft using RW with up to MinEchonitially demonstrating improved step height/ length and foot clearance but then continues to require consistent vc for increasing knee and hip flexion in swing phase.  ? ?NMR performed for improvements in motor control and coordination, balance, sequencing, judgement, and self confidence/ efficacy in performing all aspects of mobility at highest level of independence.  ? ?Wheelchair Mobility:  ?Patient propelled wheelchair 75 feet with CGA. Provided vc for timing of L foot step progression, heel strike. ? ?Patient supine  in bed at end of session with brakes locked, bed alarm set, and all needs within reach. ? ? ?Therapy Documentation ?Precautions:  ?Precautions ?Precautions: Fall ?Precaution Comments: L hemiparesis ?Restrictions ?Weight Bearing Restrictions: No ?General: ?  ?Vital Signs: ?Therapy Vitals ?Temp: (!) 97.5 ?F (36.4 ?C) ?Temp Source: Oral ?Pulse Rate: 66 ?BP: (!)Marland Kitchen  157/72 ?Patient Position (if appropriate): Lying ?Oxygen Therapy ?SpO2: 97 % ?Pain: ? No pain complaint this session. ? ?Therapy/Group: Individual Therapy ? ?Alger Simons PT, DPT ?11/29/2021, 7:53 AM  ?

## 2021-11-29 NOTE — Plan of Care (Signed)
?  Problem: Consults ?Goal: RH STROKE PATIENT EDUCATION ?Description: See Patient Education module for education specifics  ?Outcome: Progressing ?  ?Problem: RH BOWEL ELIMINATION ?Goal: RH STG MANAGE BOWEL WITH ASSISTANCE ?Description: STG Manage Bowel with mod I  Assistance. ?Outcome: Progressing ?  ?Problem: RH BLADDER ELIMINATION ?Goal: RH STG MANAGE BLADDER WITH ASSISTANCE ?Description: STG Manage Bladder With  toileting Assistance ?Outcome: Progressing ?  ?Problem: RH SAFETY ?Goal: RH STG ADHERE TO SAFETY PRECAUTIONS W/ASSISTANCE/DEVICE ?Description: STG Adhere to Safety Precautions With cues Assistance/Device. ?Outcome: Not Progressing ?  ?Problem: RH KNOWLEDGE DEFICIT ?Goal: RH STG INCREASE KNOWLEDGE OF DIABETES ?Description: Patient and spouse will be able to manage DM with medications, and dietary modifications using handouts and educational resources independently ?Outcome: Progressing ?Goal: RH STG INCREASE KNOWLEDGE OF HYPERTENSION ?Description: Patient and spouse will be able to manage HTN with medications, and dietary modifications using handouts and educational resources independently ?Outcome: Progressing ?Goal: RH STG INCREASE KNOWLEGDE OF HYPERLIPIDEMIA ?Description: Patient and spouse will be able to manage HLD with medications, and dietary modifications using handouts and educational resources independently ?Outcome: Progressing ?Goal: RH STG INCREASE KNOWLEDGE OF STROKE PROPHYLAXIS ?Description: Patient and spouse will be able to manage secondary stroke risks with medications, and dietary modifications using handouts and educational resources independently ?Outcome: Progressing ?  ?Problem: RH Vision ?Goal: RH LTG Vision (Specify) ?Outcome: Progressing ?  ?

## 2021-11-29 NOTE — Progress Notes (Signed)
?                                                       PROGRESS NOTE ? ? ?Subjective/Complaints: ?Pt tucked under covers. No complaints. Congestion a little better. Able to sleep. Denied pain ? ?ROS: Patient denies fever, rash, sore throat, blurred vision, dizziness, nausea, vomiting, diarrhea, cough, shortness of breath or chest pain, joint or back/neck pain, headache, or mood change.  ? ? ? ?Objective: ?  ?DG Chest 2 View ? ?Result Date: 11/27/2021 ?CLINICAL DATA:  Cough. EXAM: CHEST - 2 VIEW COMPARISON:  Chest x-ray dated November 19, 2021. FINDINGS: The heart size and mediastinal contours are within normal limits. Both lungs are clear. The visualized skeletal structures are unremarkable. IMPRESSION: No active cardiopulmonary disease. Electronically Signed   By: Titus Dubin M.D.   On: 11/27/2021 11:26   ?No results for input(s): WBC, HGB, HCT, PLT in the last 72 hours. ? ?No results for input(s): NA, K, CL, CO2, GLUCOSE, BUN, CREATININE, CALCIUM in the last 72 hours. ? ? ?Intake/Output Summary (Last 24 hours) at 11/29/2021 1052 ?Last data filed at 11/29/2021 0754 ?Gross per 24 hour  ?Intake 420 ml  ?Output --  ?Net 420 ml  ?  ? ?  ? ?Physical Exam: ?Vital Signs ?Blood pressure (!) 157/72, pulse 66, temperature (!) 97.5 ?F (36.4 ?C), temperature source Oral, resp. rate 16, height '5\' 2"'$  (1.575 m), weight 63.4 kg, SpO2 97 %. ? ?Constitutional: No distress . Vital signs reviewed. ?HEENT: NCAT, EOMI, oral membranes moist ?Neck: supple ?Cardiovascular: RRR without murmur. No JVD    ?Respiratory/Chest: CTA Bilaterally without wheezes or rales. Normal effort    ?GI/Abdomen: BS +, non-tender, non-distended ?Ext: no clubbing, cyanosis, or edema ?Psych: pleasant and cooperative  ?Skin: No evidence of breakdown, no evidence of rash ?Neurologic: reasonable insight and awareness. No language issues.  Cranial nerves II through XII intact, motor strength is 5/5 in RIght and 4-/5 left deltoid, bicep, tricep, grip, hip flexor,  knee extensors, ankle dorsiflexor and plantar flexor-no changes ?Sensory exam normal for light touch and pain in all 4 limbs. No limb ataxia or cerebellar signs. No abnormal tone appreciated.    ?Musculoskeletal: Full range of motion in all 4 extremities. No joint swelling ? ? ? ?Assessment/Plan: ?1. Functional deficits which require 3+ hours per day of interdisciplinary therapy in a comprehensive inpatient rehab setting. ?Physiatrist is providing close team supervision and 24 hour management of active medical problems listed below. ?Physiatrist and rehab team continue to assess barriers to discharge/monitor patient progress toward functional and medical goals ? ?Care Tool: ? ?Bathing ?   ?Body parts bathed by patient: Left arm, Chest, Abdomen, Right upper leg, Left upper leg, Right lower leg, Left lower leg, Face  ? Body parts bathed by helper: Right arm ?  ?  ?Bathing assist Assist Level: Moderate Assistance - Patient 50 - 74% ?  ?  ?Upper Body Dressing/Undressing ?Upper body dressing   ?What is the patient wearing?: Pull over shirt ?   ?Upper body assist Assist Level: Supervision/Verbal cueing ?   ?Lower Body Dressing/Undressing ?Lower body dressing ? ? ?   ?What is the patient wearing?: Underwear/pull up ? ?  ? ?Lower body assist Assist for lower body dressing: Moderate Assistance - Patient 50 - 74% ?   ? ?  Toileting ?Toileting Toileting Activity did not occur (Probation officer and hygiene only): N/A (no void or bm)  ?Toileting assist Assist for toileting: Moderate Assistance - Patient 50 - 74% ?  ?  ?Transfers ?Chair/bed transfer ? ?Transfers assist ?   ? ?Chair/bed transfer assist level: Maximal Assistance - Patient 25 - 49% ?  ?  ?Locomotion ?Ambulation ? ? ?Ambulation assist ? ? Ambulation activity did not occur: Safety/medical concerns ? ?  ?  ?   ? ?Walk 10 feet activity ? ? ?Assist ? Walk 10 feet activity did not occur: Safety/medical concerns ? ?  ?   ? ?Walk 50 feet activity ? ? ?Assist Walk 50 feet  with 2 turns activity did not occur: Safety/medical concerns ? ?  ?   ? ? ?Walk 150 feet activity ? ? ?Assist Walk 150 feet activity did not occur: Safety/medical concerns ? ?  ?  ?  ? ?Walk 10 feet on uneven surface  ?activity ? ? ?Assist Walk 10 feet on uneven surfaces activity did not occur: Safety/medical concerns ? ? ?  ?   ? ?Wheelchair ? ? ? ? ?Assist Is the patient using a wheelchair?: Yes ?Type of Wheelchair: Manual ?  ? ?Wheelchair assist level: Dependent - Patient 0% ?Max wheelchair distance: 150  ? ? ?Wheelchair 50 feet with 2 turns activity ? ? ? ?Assist ? ?  ?  ? ? ?Assist Level: Dependent - Patient 0%  ? ?Wheelchair 150 feet activity  ? ? ? ?Assist ?   ? ? ?Assist Level: Dependent - Patient 0%  ? ?Blood pressure (!) 157/72, pulse 66, temperature (!) 97.5 ?F (36.4 ?C), temperature source Oral, resp. rate 16, height '5\' 2"'$  (1.575 m), weight 63.4 kg, SpO2 97 %. ? ?Medical Problem List and Plan: ?1. 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            -patient may shower ?            -ELOS/Goals: 14-20 days, min assist to supervision goals  ? -Continue CIR therapies including PT, OT  ?2.  Antithrombotics: ?-DVT/anticoagulation:  Pharmaceutical: Lovenox ?            -antiplatelet therapy: Plavix and aspirin ?3. Pain Management: Tylenol ?4. Mood: LCSW to evaluate and provide emotional support ?            -antipsychotic agents: Not applicable ?5. Neuropsych: This patient is capable of making decisions on her own behalf. ?6. Skin/Wound Care: Routine skin care checks ?7. Fluids/Electrolytes/Nutrition: Routine I's and O's and follow-up chemistries ?Dysphagia , MBS performed at OSH ?            -- D2/nectar diet currently ?8: DM-2: carb modified diet ?--CBGs and SSI (no home meds) ?CBG (last 3)  ?Recent Labs  ?  11/28/21 ?1649 11/28/21 ?2216 11/29/21 ?0608  ?GLUCAP 142* 135* 130*  ?Controlled 3/24--continue with  diet control ? ?9: Seasonal allergies: Continue symptomatic relief as needed ?10: GERD: Continue Protonix ?11: COPD: Continue albuterol nebs as needed (no home meds)- smoking hx  ?            --no supplemental O2 needs ?12: Hypokalemia: resolved with supplementation>>>continue and follow-up BMP ? ?  Latest Ref Rng & Units 11/26/2021  ?  5:26 AM 11/20/2021  ?  5:29 AM 08/26/2021  ?  8:45 AM  ?BMP  ?Glucose 70 - 99 mg/dL 98  111   179    ?BUN 8 - 23 mg/dL '17   20   11    '$ ?Creatinine 0.44 - 1.00 mg/dL 0.72   0.69   0.78    ?BUN/Creat Ratio 12 - 28   14    ?Sodium 135 - 145 mmol/L 135   133   135    ?Potassium 3.5 - 5.1 mmol/L 4.4   4.0   3.6    ?Chloride 98 - 111 mmol/L 102   99   99    ?CO2 22 - 32 mmol/L '25   24   21    '$ ?Calcium 8.9 - 10.3 mg/dL 9.5   9.6   9.9    ?Recheck on 3/21 normal ==f/u Tuesday ? ?13: Hyperlipidemia: intolerant of all statins (no other alternatives listed on home meds) ?14: Hypertension: Controlled off home amlodipine ?             ?Vitals:  ? 11/28/21 1943 11/29/21 0436  ?BP: (!) 151/61 (!) 157/72  ?Pulse: 62 66  ?Resp: 16   ?Temp: 98.8 ?F (37.1 ?C) (!) 97.5 ?F (36.4 ?C)  ?SpO2: 100% 97%  ? Uncontrolled but improving 3/22 resume amlodipine started '5mg'$  on 3/20, monitor for 2-3 d prior to dose adjustment  ? 3/24 fair control--no changes today ?15: Chronic right knee pain: will monitor how this affects therapy , no NSAIDs due to CVA , may use tylenol  ? 16.  Neurogenic bladder - spastic , due to CVA ,  ? -UCX + for E Coli, sensitive to keflex ?  -continue keflex for 7 days ? -consider antispasmodic potentially ? ? ?LOS: ?10 days ?A FACE TO FACE EVALUATION WAS PERFORMED ? ?Meredith Staggers ?11/29/2021, 10:52 AM  ? ? ? ?

## 2021-11-29 NOTE — Progress Notes (Signed)
Speech Language Pathology Daily Session Note ? ?Patient Details  ?Name: Elizabeth [name] ?MRN: [id number] ?Date of Birth: [birth year] ? ?Today's Date: 11/29/2021 ?SLP Individual Time: 8502-7741 ?SLP Individual Time Calculation (min): 45 min ? ?Short Term Goals: ?Week 2: SLP Short Term Goal 1 (Week 2): Patient will consume current diet without overt s/sx of aspiration with min A verbal cues for use of swallowing compensatory strategies ?SLP Short Term Goal 2 (Week 2): Patient will utilize speech intelligiblity strategies at the sentence level with min A  cues to achieve 75%+ intelligiblity ?SLP Short Term Goal 3 (Week 2): Patient will complete mildly complex problem solving tasks with min A verbal/visual cues ?SLP Short Term Goal 4 (Week 2): Patient will demonstrate awareness to errors during functional and novel tasks with min A verbal/visual cues ?SLP Short Term Goal 5 (Week 2): Patient will recall and implement up to 2 beneficial compensatory memory strategies to recall new, daily information with min-to-mod A verbal and question cues ? ?Skilled Therapeutic Interventions: Skilled ST treatment focused on cognitive goals. Patient completed subtests of the Assessment of Language-Related Functional Activities (ALFA). Pt completed "solving daily math problems" with 40% accuracy without cues, and 100% accuracy with mod-to-max A verbal cues. Patient also completed "understanding medicine labels" with with 50% accuracy without cues, progressing to 80% accuracy with mod-to-max A verbal cues. Patient consumed nectar thick liquids by straw without overt s/sx of aspiration and sup A verbal cues to take small, single sips. Patient was left in bed with alarm activated and immediate needs within reach at end of session. Continue per current plan of care.   ?   ?Pain ? No pain ? ?Therapy/Group: Individual Therapy ? ?Wajiha Versteeg T Brynne Doane ?11/29/2021, 10:40 AM ?

## 2021-11-30 DIAGNOSIS — A499 Bacterial infection, unspecified: Secondary | ICD-10-CM | POA: Diagnosis not present

## 2021-11-30 DIAGNOSIS — N390 Urinary tract infection, site not specified: Secondary | ICD-10-CM | POA: Diagnosis not present

## 2021-11-30 DIAGNOSIS — I10 Essential (primary) hypertension: Secondary | ICD-10-CM | POA: Diagnosis not present

## 2021-11-30 DIAGNOSIS — I6381 Other cerebral infarction due to occlusion or stenosis of small artery: Secondary | ICD-10-CM | POA: Diagnosis not present

## 2021-11-30 LAB — GLUCOSE, CAPILLARY
Glucose-Capillary: 117 mg/dL — ABNORMAL HIGH (ref 70–99)
Glucose-Capillary: 128 mg/dL — ABNORMAL HIGH (ref 70–99)
Glucose-Capillary: 115 mg/dL — ABNORMAL HIGH (ref 70–99)
Glucose-Capillary: 135 mg/dL — ABNORMAL HIGH (ref 70–99)

## 2021-11-30 NOTE — Progress Notes (Signed)
?                                                       PROGRESS NOTE ? ? ?Subjective/Complaints: ?No new issues. Was able to sleep last night. Denies pain. Still with some urinary frequency ? ?ROS: Patient denies fever, rash, sore throat, blurred vision, dizziness, nausea, vomiting, diarrhea, cough, shortness of breath or chest pain, joint or back/neck pain, headache, or mood change.  ? ? ? ?Objective: ?  ?No results found. ?No results for input(s): WBC, HGB, HCT, PLT in the last 72 hours. ? ?No results for input(s): NA, K, CL, CO2, GLUCOSE, BUN, CREATININE, CALCIUM in the last 72 hours. ? ? ?Intake/Output Summary (Last 24 hours) at 11/30/2021 1243 ?Last data filed at 11/30/2021 9528 ?Gross per 24 hour  ?Intake 630 ml  ?Output --  ?Net 630 ml  ?  ? ?  ? ?Physical Exam: ?Vital Signs ?Blood pressure (!) 130/50, pulse 62, temperature (!) 97.5 ?F (36.4 ?C), temperature source Oral, resp. rate 18, height '5\' 2"'$  (1.575 m), weight 63.4 kg, SpO2 96 %. ? ?Constitutional: No distress . Vital signs reviewed. ?HEENT: NCAT, EOMI, oral membranes moist ?Neck: supple ?Cardiovascular: RRR without murmur. No JVD    ?Respiratory/Chest: CTA Bilaterally without wheezes or rales. Normal effort    ?GI/Abdomen: BS +, non-tender, non-distended ?Ext: no clubbing, cyanosis, or edema ?Psych: pleasant and cooperative  ?Skin: No evidence of breakdown, no evidence of rash ?Neurologic: reasonable insight and awareness. No language issues.  Cranial nerves II through XII intact, motor strength is 5/5 in RIght and 4-/5 left deltoid, bicep, tricep, grip, hip flexor, knee extensors, ankle dorsiflexor and plantar flexor-no changes ?Sensory exam normal for light touch and pain in all 4 limbs. No limb ataxia or cerebellar signs. No abnormal tone appreciated.   ?Musculoskeletal: Full range of motion in all 4 extremities. No joint swelling ? ? ? ?Assessment/Plan: ?1. Functional deficits which require 3+ hours per day of interdisciplinary therapy in a  comprehensive inpatient rehab setting. ?Physiatrist is providing close team supervision and 24 hour management of active medical problems listed below. ?Physiatrist and rehab team continue to assess barriers to discharge/monitor patient progress toward functional and medical goals ? ?Care Tool: ? ?Bathing ?   ?Body parts bathed by patient: Left arm, Chest, Abdomen, Right upper leg, Left upper leg, Right lower leg, Left lower leg, Face  ? Body parts bathed by helper: Right arm ?  ?  ?Bathing assist Assist Level: Moderate Assistance - Patient 50 - 74% ?  ?  ?Upper Body Dressing/Undressing ?Upper body dressing   ?What is the patient wearing?: Pull over shirt ?   ?Upper body assist Assist Level: Supervision/Verbal cueing ?   ?Lower Body Dressing/Undressing ?Lower body dressing ? ? ?   ?What is the patient wearing?: Underwear/pull up ? ?  ? ?Lower body assist Assist for lower body dressing: Moderate Assistance - Patient 50 - 74% ?   ? ?Toileting ?Toileting Toileting Activity did not occur (Probation officer and hygiene only): N/A (no void or bm)  ?Toileting assist Assist for toileting: Moderate Assistance - Patient 50 - 74% ?  ?  ?Transfers ?Chair/bed transfer ? ?Transfers assist ?   ? ?Chair/bed transfer assist level: Maximal Assistance - Patient 25 - 49% ?  ?  ?Locomotion ?Ambulation ? ? ?  Ambulation assist ? ? Ambulation activity did not occur: Safety/medical concerns ? ?  ?  ?   ? ?Walk 10 feet activity ? ? ?Assist ? Walk 10 feet activity did not occur: Safety/medical concerns ? ?  ?   ? ?Walk 50 feet activity ? ? ?Assist Walk 50 feet with 2 turns activity did not occur: Safety/medical concerns ? ?  ?   ? ? ?Walk 150 feet activity ? ? ?Assist Walk 150 feet activity did not occur: Safety/medical concerns ? ?  ?  ?  ? ?Walk 10 feet on uneven surface  ?activity ? ? ?Assist Walk 10 feet on uneven surfaces activity did not occur: Safety/medical concerns ? ? ?  ?   ? ?Wheelchair ? ? ? ? ?Assist Is the patient using a  wheelchair?: Yes ?Type of Wheelchair: Manual ?  ? ?Wheelchair assist level: Dependent - Patient 0% ?Max wheelchair distance: 150  ? ? ?Wheelchair 50 feet with 2 turns activity ? ? ? ?Assist ? ?  ?  ? ? ?Assist Level: Dependent - Patient 0%  ? ?Wheelchair 150 feet activity  ? ? ? ?Assist ?   ? ? ?Assist Level: Dependent - Patient 0%  ? ?Blood pressure (!) 130/50, pulse 62, temperature (!) 97.5 ?F (36.4 ?C), temperature source Oral, resp. rate 18, height '5\' 2"'$  (1.575 m), weight 63.4 kg, SpO2 96 %. ? ?Medical Problem List and Plan: ?1. 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            -patient may shower ?            -ELOS/Goals: 14-20 days, min assist to supervision goals  ? -Continue CIR therapies including PT, OT  ?2.  Antithrombotics: ?-DVT/anticoagulation:  Pharmaceutical: Lovenox ?            -antiplatelet therapy: Plavix and aspirin ?3. Pain Management: Tylenol ?4. Mood: LCSW to evaluate and provide emotional support ?            -antipsychotic agents: Not applicable ?5. Neuropsych: This patient is capable of making decisions on her own behalf. ?6. Skin/Wound Care: Routine skin care checks ?7. Fluids/Electrolytes/Nutrition: Routine I's and O's and follow-up chemistries ?Dysphagia , MBS performed at OSH ?            -- D2/nectar diet currently ?8: DM-2: carb modified diet ?--CBGs and SSI (no home meds) ?CBG (last 3)  ?Recent Labs  ?  11/29/21 ?2113 11/30/21 ?0627 11/30/21 ?1148  ?GLUCAP 127* 117* 128*  ?Controlled 3/25--continue with diet control ? -dc cbg's  ?9: Seasonal allergies: Continue symptomatic relief as needed ?10: GERD: Continue Protonix ?11: COPD: Continue albuterol nebs as needed (no home meds)- smoking hx  ?            --no supplemental O2 needs ?12: Hypokalemia: resolved with supplementation>>>continue and follow-up BMP ? ?  Latest Ref Rng & Units 11/26/2021  ?  5:26 AM 11/20/2021  ?  5:29 AM  08/26/2021  ?  8:45 AM  ?BMP  ?Glucose 70 - 99 mg/dL 98   111   179    ?BUN 8 - 23 mg/dL '17   20   11    '$ ?Creatinine 0.44 - 1.00 mg/dL 0.72   0.69   0.78    ?BUN/Creat Ratio 12 - 28   14    ?Sodium 135 - 145 mmol/L 135   133  135    ?Potassium 3.5 - 5.1 mmol/L 4.4   4.0   3.6    ?Chloride 98 - 111 mmol/L 102   99   99    ?CO2 22 - 32 mmol/L '25   24   21    '$ ?Calcium 8.9 - 10.3 mg/dL 9.5   9.6   9.9    ?  ? ?13: Hyperlipidemia: intolerant of all statins (no other alternatives listed on home meds) ?14: Hypertension: Controlled off home amlodipine ?             ?Vitals:  ? 11/29/21 2032 11/30/21 0517  ?BP: (!) 142/62 (!) 130/50  ?Pulse: 64 62  ?Resp: 14 18  ?Temp: 98.7 ?F (37.1 ?C) (!) 97.5 ?F (36.4 ?C)  ?SpO2: 96% 96%  ? Uncontrolled but improving 3/22 resume amlodipine started '5mg'$  on 3/20, monitor for 2-3 d prior to dose adjustment  ? 3/25 fair to good control--no changes today ?15: Chronic right knee pain: will monitor how this affects therapy , no NSAIDs due to CVA , may use tylenol  ? 16.  Neurogenic bladder - spastic , due to CVA ,  ? -UCX + for E Coli, sensitive to keflex ?  -continue keflex for 7 days ? -consider antispasmodic potentially ? -still somewhat frequent ? ? ?LOS: ?11 days ?A FACE TO FACE EVALUATION WAS PERFORMED ? ?Meredith Staggers ?11/30/2021, 12:43 PM  ? ? ? ?

## 2021-11-30 NOTE — Progress Notes (Signed)
Physical Therapy Session Note ? ?Patient Details  ?Name: Elizabeth [name] ?MRN: [id number] ?Date of Birth: 31-Jan-1941 ? ?Today's Date: 11/30/2021 ?PT Individual Time: 5462-7035 ?PT Individual Time Calculation (min): 60 min  ? ?Short Term Goals: ?Week 2:  PT Short Term Goal 1 (Week 2): Pt will perform bed mobiltiy with min assist consistently ?PT Short Term Goal 2 (Week 2): Pt will ambulate 35f with min assist consistently ?PT Short Term Goal 3 (Week 2): [facility] ?PT Short Term Goal 4 (Week 2): Pt will ascend 4 steps with min assist and rails as needed ? ?Skilled Therapeutic Interventions/Progress Updates:  ?  Pt received seated in bed, agreeable to PT session. No complaints of pain. Seated in bed to sitting EOB with min A needed for LLE management. Pt requires min A for sitting balance due to some posterior and L lateral lean. Assisted pt with donning pants and shoes while seated EOB. Sit to stand with min A to RW with cues for safe UE placement and to attend to LUE. Stand pivot transfer to w/c with RW and min A, cues for attention to LLE when turning and stepping. Standing alt L/R 4" step-taps with RW and mirror for visual feedback with focus on LE coordination as well as standing balance. Pt with tendency to lose balance to the L with multimodal cueing and min to mod A required to prevent fall and for balance recovery. Pt tends to narrow BOS in standing leading to increased risk of falling to the L. Added 3# ankle weight to LLE for increased input into limb with task. Pt exhibits some improved control of limb with step-taps but continues to exhibit impaired standing balance with LOB to the L. Ambulation 3 x 35 ft with RW with min A for balance with use of 3# ankle weight on LLE. Pt requires cues to attend to LUE to place on RW during transfer and to keep in place during gait as well as cues to increase step length with LLE. When pt does not attend to LLE she exhibits decreased step  length and catches her foot on the floor. Manual w/c propulsion x 15 ft with use of R UE/LE with min A needed for steering due to inattention to L visual field and unable to avoid obstacles safely without assist. Pt left seated in w/c in room with needs in reach, quick release belt and chair alarm in place at end of session, husband present. ? ?Therapy Documentation ?Precautions:  ?Precautions ?Precautions: Fall ?Precaution Comments: L hemiparesis ?Restrictions ?Weight Bearing Restrictions: No ? ? ? ? ? ? ?Therapy/Group: Individual Therapy ? ? ?TExcell Seltzer PT, DPT, CSRS ?11/30/2021, 12:19 PM  ?

## 2021-11-30 NOTE — Plan of Care (Signed)
?  Problem: Consults ?Goal: RH STROKE PATIENT EDUCATION ?Description: See Patient Education module for education specifics  ?Outcome: Progressing ?  ?Problem: RH BOWEL ELIMINATION ?Goal: RH STG MANAGE BOWEL WITH ASSISTANCE ?Description: STG Manage Bowel with mod I  Assistance. ?Outcome: Progressing ?  ?Problem: RH BLADDER ELIMINATION ?Goal: RH STG MANAGE BLADDER WITH ASSISTANCE ?Description: STG Manage Bladder With  toileting Assistance ?Outcome: Progressing ?  ?Problem: RH SAFETY ?Goal: RH STG ADHERE TO SAFETY PRECAUTIONS W/ASSISTANCE/DEVICE ?Description: STG Adhere to Safety Precautions With cues Assistance/Device. ?Outcome: Progressing ?  ?Problem: RH KNOWLEDGE DEFICIT ?Goal: RH STG INCREASE KNOWLEDGE OF DIABETES ?Description: Patient and spouse will be able to manage DM with medications, and dietary modifications using handouts and educational resources independently ?Outcome: Progressing ?Goal: RH STG INCREASE KNOWLEDGE OF HYPERTENSION ?Description: Patient and spouse will be able to manage HTN with medications, and dietary modifications using handouts and educational resources independently ?Outcome: Progressing ?Goal: RH STG INCREASE KNOWLEGDE OF HYPERLIPIDEMIA ?Description: Patient and spouse will be able to manage HLD with medications, and dietary modifications using handouts and educational resources independently ?Outcome: Progressing ?Goal: RH STG INCREASE KNOWLEDGE OF STROKE PROPHYLAXIS ?Description: Patient and spouse will be able to manage secondary stroke risks with medications, and dietary modifications using handouts and educational resources independently ?Outcome: Progressing ?  ?Problem: RH Vision ?Goal: RH LTG Vision (Specify) ?Outcome: Progressing ?  ?

## 2021-12-01 LAB — GLUCOSE, CAPILLARY
Glucose-Capillary: 107 mg/dL — ABNORMAL HIGH (ref 70–99)
Glucose-Capillary: 103 mg/dL — ABNORMAL HIGH (ref 70–99)
Glucose-Capillary: 110 mg/dL — ABNORMAL HIGH (ref 70–99)
Glucose-Capillary: 179 mg/dL — ABNORMAL HIGH (ref 70–99)

## 2021-12-01 NOTE — Progress Notes (Signed)
Occupational Therapy Session Note ? ?Patient Details  ?Name: Elizabeth [name] ?MRN: [id number] ?Date of Birth: 1941-02-05 ? ?Today's Date: 12/01/2021 ?OT Individual Time: 8527-7824 ?OT Individual Time Calculation (min): 56 min  ? ? ?Short Term Goals: ?Week 1:  OT Short Term Goal 1 (Week 1): Pt will maintain balance seated EOB > 8 min with no more than CGA in prep for seated ADL. ?OT Short Term Goal 1 - Progress (Week 1): Met ?OT Short Term Goal 2 (Week 1): Pt will complete toilet transfer with max A and LRAD. ?OT Short Term Goal 2 - Progress (Week 1): Met ?OT Short Term Goal 3 (Week 1): Pt will complete LBD with max A and LRAD. ?OT Short Term Goal 3 - Progress (Week 1): Met ?OT Short Term Goal 4 (Week 1): Pt will don shirt with mod A. ?OT Short Term Goal 4 - Progress (Week 1): Met ?Week 2:  OT Short Term Goal 1 (Week 2): Pt will don pants with min A and AE PRN. ?OT Short Term Goal 2 (Week 2): Pt will complete ambulatory toilet transfer with min A. ?OT Short Term Goal 3 (Week 2): Pt will complete standing grooming task with no more than CGA. ? ?Skilled Therapeutic Interventions/Progress Updates:  ?Patient met lying supine in bed in agreement with OT treatment session. 0/10 pain reported at rest and with activity. Patient able to advance BLE toward EOB. HOB elevated, use of bedrail and light assist to elevate trunk. Mod A to don footwear seated EOB with additional assist to maintain static sitting balance and forward chaining. Patient with difficulty donning socks, dropping sock several times while attempting to thread foot through. Sit to stand from EOB and mobility to sink with RW and Min A. UB bathing/dressing with Min-Mod A and LB bathing/dressing in sitting/standing with Mod A. Able to doff footwear in supported sitting position without external assist. Mod cues for praxis, safety awareness, hemi technique, attention to L side of body and upright posture throughout. Patient indicating urgent need to void bladder (stool  present on washcloth but unable to have BM) requiring Mod A for transfer to [facility] with use of RW and Min-Mod A for hygiene/clothing management. Oral hygiene completed seated at sink level with Min A and cues as indicated above. Session concluded with patient seated in wc with call bell within reach, belt alarm activated and all needs met.  ? ?Therapy Documentation ?Precautions:  ?Precautions ?Precautions: Fall ?Precaution Comments: L hemiparesis ?Restrictions ?Weight Bearing Restrictions: No ?General: ?  ? ?Therapy/Group: Individual Therapy ? ?[name] ?12/01/2021, 6:56 AM ?

## 2021-12-01 NOTE — Plan of Care (Signed)
?  Problem: Consults ?Goal: RH STROKE PATIENT EDUCATION ?Description: See Patient Education module for education specifics  ?Outcome: Progressing ?  ?Problem: RH BOWEL ELIMINATION ?Goal: RH STG MANAGE BOWEL WITH ASSISTANCE ?Description: STG Manage Bowel with mod I  Assistance. ?Outcome: Progressing ?  ?Problem: RH BLADDER ELIMINATION ?Goal: RH STG MANAGE BLADDER WITH ASSISTANCE ?Description: STG Manage Bladder With  toileting Assistance ?Outcome: Progressing ?  ?Problem: RH SAFETY ?Goal: RH STG ADHERE TO SAFETY PRECAUTIONS W/ASSISTANCE/DEVICE ?Description: STG Adhere to Safety Precautions With cues Assistance/Device. ?Outcome: Progressing ?  ?Problem: RH KNOWLEDGE DEFICIT ?Goal: RH STG INCREASE KNOWLEDGE OF DIABETES ?Description: Patient and spouse will be able to manage DM with medications, and dietary modifications using handouts and educational resources independently ?Outcome: Progressing ?Goal: RH STG INCREASE KNOWLEDGE OF HYPERTENSION ?Description: Patient and spouse will be able to manage HTN with medications, and dietary modifications using handouts and educational resources independently ?Outcome: Progressing ?Goal: RH STG INCREASE KNOWLEGDE OF HYPERLIPIDEMIA ?Description: Patient and spouse will be able to manage HLD with medications, and dietary modifications using handouts and educational resources independently ?Outcome: Progressing ?Goal: RH STG INCREASE KNOWLEDGE OF STROKE PROPHYLAXIS ?Description: Patient and spouse will be able to manage secondary stroke risks with medications, and dietary modifications using handouts and educational resources independently ?Outcome: Progressing ?  ?Problem: RH Vision ?Goal: RH LTG Vision (Specify) ?Outcome: Progressing ?  ?

## 2021-12-02 DIAGNOSIS — I6381 Other cerebral infarction due to occlusion or stenosis of small artery: Secondary | ICD-10-CM | POA: Diagnosis not present

## 2021-12-02 LAB — GLUCOSE, CAPILLARY
Glucose-Capillary: 168 mg/dL — ABNORMAL HIGH (ref 70–99)
Glucose-Capillary: 123 mg/dL — ABNORMAL HIGH (ref 70–99)
Glucose-Capillary: 117 mg/dL — ABNORMAL HIGH (ref 70–99)
Glucose-Capillary: 122 mg/dL — ABNORMAL HIGH (ref 70–99)

## 2021-12-02 NOTE — Progress Notes (Signed)
Physical Therapy Session Note ? ?Patient Details  ?Name: Elizabeth [name] ?MRN: [id number] ?Date of Birth: March 17, 1941 ? ?Today's Date: 12/02/2021 ?PT Individual Time: 1303-1400 ?PT Individual Time Calculation (min): 57 min  ? ?Short Term Goals: ?Week 1:  PT Short Term Goal 1 (Week 1): Pt will transfer to [facility] with min assist and LRAD ?PT Short Term Goal 1 - Progress (Week 1): Met ?PT Short Term Goal 2 (Week 1): Pt will ambulate with RW x 92f with mod assist ?PT Short Term Goal 2 - Progress (Week 1): Met ?PT Short Term Goal 3 (Week 1): Pt will propell WC 1066fwith min assist ?PT Short Term Goal 3 - Progress (Week 1): Met ?Week 2:  PT Short Term Goal 1 (Week 2): Pt will perform bed mobiltiy with min assist consistently ?PT Short Term Goal 2 (Week 2): Pt will ambulate 754fith min assist consistently ?PT Short Term Goal 3 (Week 2): Pt [facility] ?PT Short Term Goal 4 (Week 2): Pt will ascend 4 steps with min assist and rails as needed ? ?Skilled Therapeutic Interventions/Progress Updates:  ?Patient standing with RW on entrance to room with RN. Pt transitioning to recliner. Patient alert and agreeable to PT session. Requires donning of shoes while seated in w/c. ? ?Patient with no pain complaint throughout session. ? ?Therapeutic Activity: ?Transfers: Patient performed sit<>stand and stand pivot transfers throughout session with ModA initially with strong lean to L. Improves throughout session with progress to Provided verbal cues for technique throughout session. ? ?Gait Training:  ?Patient ambulated 130' x1 using RW with [facility] to [facility] balance during missteps and with increased lean to L. Provided vc/ tc for upright posture, L foot clearance with increased height/ length of step, upright posture.  ? ?Neuromuscular Re-ed: ?NMR facilitated during session with focus on forced use of LLE. Pt guided in sit<>stand performance from mat table with 2" step, then 4" step under RLE. Pt uncomfortable with  staggered height of step and intermittently steps L foot up to step with other foot. Improves on flat floor with close supervision to rise to stand and  using no AD. Minisquats performed on flat surface with use of mirror to indicate midline positioning.  ? ?Pt guided in grab of horseshoe with L hand, passing to R hand and hanging horseshoe on rim of basketball hoop. Pt noted to "cheat" and grabbing horseshoe with R hand when L taking too long with increased effort. MinA provided for balance. NMR performed for improvements in motor control and coordination, balance, sequencing, judgement, and self confidence/ efficacy in performing all aspects of mobility at highest level of independence.  ? ?Patient seated upright  in ecliner at end of session with brakes locked, belt alarm set, and all needs within reach. ? ? ?Therapy Documentation ?Precautions:  ?Precautions ?Precautions: Fall ?Precaution Comments: L hemiparesis ?Restrictions ?Weight Bearing Restrictions: No ?General: ?  ?Pain: ? No pin complaint this session.  ? ?Therapy/Group: Individual Therapy ? ?JulAlger Simons, DPT ?12/02/2021, 6:22 PM  ?

## 2021-12-02 NOTE — Progress Notes (Signed)
Occupational Therapy Session Note ? ?Patient Details  ?Name: Elizabeth [name] ?MRN: [id number] ?Date of Birth: Nov 28, 1940 ? ?Today's Date: 12/02/2021 ?OT Individual Time: 7371-0626 ?OT Individual Time Calculation (min): 60 min  ? ? ?Short Term Goals: ?Week 2:  OT Short Term Goal 1 (Week 2): Pt will don pants with min A and AE PRN. ?OT Short Term Goal 2 (Week 2): Pt will complete ambulatory toilet transfer with min A. ?OT Short Term Goal 3 (Week 2): Pt will complete standing grooming task with no more than CGA. ? ?Skilled Therapeutic Interventions/Progress Updates:  ?  Pt resting in bed upon arrival with husband present. OT intervention with focus on bed mobility, functional transfers, sitting balance, LUE forced use, and activity tolerance to increase independence with BADLs. Supine>sit EOB with max A. Sit<>stand and stand pivot transfers with min A and max verbal cues for sequencing/safety. Stand pivot transfer to EOB. LUE weight bearing while reaching across body with RUE. Functional tasks (open chain and closed chain) incorporating shoulder flexion/extension, functional grasp, and elbow flexion/extension. Pt returned to room and remained in w/c with all needs within reach. Belt alarm activated. Husband present. ? ?Therapy Documentation ?Precautions:  ?Precautions ?Precautions: Fall ?Precaution Comments: L hemiparesis ?Restrictions ?Weight Bearing Restrictions: No ? ?Pain: ?Pt denies pain this morning ? ?Therapy/Group: Individual Therapy ? ?Leroy Libman ?12/02/2021, 12:32 PM ?

## 2021-12-02 NOTE — Plan of Care (Signed)
?  Problem: Consults ?Goal: RH STROKE PATIENT EDUCATION ?Description: See Patient Education module for education specifics  ?Outcome: Progressing ?  ?Problem: RH BOWEL ELIMINATION ?Goal: RH STG MANAGE BOWEL WITH ASSISTANCE ?Description: STG Manage Bowel with mod I  Assistance. ?Outcome: Progressing ?  ?Problem: RH BLADDER ELIMINATION ?Goal: RH STG MANAGE BLADDER WITH ASSISTANCE ?Description: STG Manage Bladder With  toileting Assistance ?Outcome: Progressing ?  ?Problem: RH SAFETY ?Goal: RH STG ADHERE TO SAFETY PRECAUTIONS W/ASSISTANCE/DEVICE ?Description: STG Adhere to Safety Precautions With cues Assistance/Device. ?Outcome: Progressing ?  ?Problem: RH KNOWLEDGE DEFICIT ?Goal: RH STG INCREASE KNOWLEDGE OF DIABETES ?Description: Patient and spouse will be able to manage DM with medications, and dietary modifications using handouts and educational resources independently ?Outcome: Progressing ?Goal: RH STG INCREASE KNOWLEDGE OF HYPERTENSION ?Description: Patient and spouse will be able to manage HTN with medications, and dietary modifications using handouts and educational resources independently ?Outcome: Progressing ?Goal: RH STG INCREASE KNOWLEGDE OF HYPERLIPIDEMIA ?Description: Patient and spouse will be able to manage HLD with medications, and dietary modifications using handouts and educational resources independently ?Outcome: Progressing ?Goal: RH STG INCREASE KNOWLEDGE OF STROKE PROPHYLAXIS ?Description: Patient and spouse will be able to manage secondary stroke risks with medications, and dietary modifications using handouts and educational resources independently ?Outcome: Progressing ?  ?Problem: RH Vision ?Goal: RH LTG Vision (Specify) ?Outcome: Progressing ?  ?

## 2021-12-02 NOTE — Progress Notes (Signed)
?                                                       PROGRESS NOTE ? ? ?Subjective/Complaints: ?She is doing ok ?Husband is at bedside.  ?Has some movement in her left upper extremity, discussed timeline of recovery with them. ? ?ROS: Patient denies fever, rash, sore throat, blurred vision, dizziness, nausea, vomiting, diarrhea, cough, shortness of breath or chest pain, joint or back/neck pain, headache, or mood change.  ? ? ? ?Objective: ?  ?No results found. ?No results for input(s): WBC, HGB, HCT, PLT in the last 72 hours. ? ?No results for input(s): NA, K, CL, CO2, GLUCOSE, BUN, CREATININE, CALCIUM in the last 72 hours. ? ? ?Intake/Output Summary (Last 24 hours) at 12/02/2021 1710 ?Last data filed at 12/02/2021 1300 ?Gross per 24 hour  ?Intake 360 ml  ?Output 200 ml  ?Net 160 ml  ?  ? ?  ? ?Physical Exam: ?Vital Signs ?Blood pressure 136/70, pulse 71, temperature 97.8 ?F (36.6 ?C), temperature source Oral, resp. rate 17, height '5\' 2"'$  (1.575 m), weight 63.4 kg, SpO2 94 %. ? ?Gen: no distress, normal appearing ?HEENT: oral mucosa pink and moist, NCAT ?Cardio: Reg rate ?Chest: normal effort, normal rate of breathing ?Abd: soft, non-distended ?Ext: no edema ?Psych: pleasant, normal affect ? ?Skin: No evidence of breakdown, no evidence of rash ?Neurologic: reasonable insight and awareness. No language issues.  Cranial nerves II through XII intact, motor strength is 5/5 in RIght and 4-/5 left deltoid, bicep, tricep, grip, hip flexor, knee extensors, ankle dorsiflexor and plantar flexor-no changes ?Sensory exam normal for light touch and pain in all 4 limbs. No limb ataxia or cerebellar signs. No abnormal tone appreciated.    ?Musculoskeletal: Full range of motion in all 4 extremities. No joint swelling ? ? ? ?Assessment/Plan: ?1. Functional deficits which require 3+ hours per day of interdisciplinary therapy in a comprehensive inpatient rehab setting. ?Physiatrist is providing close team supervision and 24 hour  management of active medical problems listed below. ?Physiatrist and rehab team continue to assess barriers to discharge/monitor patient progress toward functional and medical goals ? ?Care Tool: ? ?Bathing ?   ?Body parts bathed by patient: Left arm, Chest, Abdomen, Right upper leg, Left upper leg, Right lower leg, Left lower leg, Face, Front perineal area  ? Body parts bathed by helper: Buttocks, Right arm ?  ?  ?Bathing assist Assist Level: Moderate Assistance - Patient 50 - 74% ?  ?  ?Upper Body Dressing/Undressing ?Upper body dressing   ?What is the patient wearing?: Pull over shirt ?   ?Upper body assist Assist Level: Minimal Assistance - Patient > 75% ?   ?Lower Body Dressing/Undressing ?Lower body dressing ? ? ?   ?What is the patient wearing?: Underwear/pull up, Pants ? ?  ? ?Lower body assist Assist for lower body dressing: Moderate Assistance - Patient 50 - 74% ?   ? ?Toileting ?Toileting Toileting Activity did not occur (Probation officer and hygiene only): N/A (no void or bm)  ?Toileting assist Assist for toileting: Moderate Assistance - Patient 50 - 74% ?  ?  ?Transfers ?Chair/bed transfer ? ?Transfers assist ?   ? ?Chair/bed transfer assist level: Minimal Assistance - Patient > 75% ?  ?  ?Locomotion ?Ambulation ? ? ?Ambulation assist ? ?  Ambulation activity did not occur: Safety/medical concerns ? ?  ?  ?   ? ?Walk 10 feet activity ? ? ?Assist ? Walk 10 feet activity did not occur: Safety/medical concerns ? ?  ?   ? ?Walk 50 feet activity ? ? ?Assist Walk 50 feet with 2 turns activity did not occur: Safety/medical concerns ? ?  ?   ? ? ?Walk 150 feet activity ? ? ?Assist Walk 150 feet activity did not occur: Safety/medical concerns ? ?  ?  ?  ? ?Walk 10 feet on uneven surface  ?activity ? ? ?Assist Walk 10 feet on uneven surfaces activity did not occur: Safety/medical concerns ? ? ?  ?   ? ?Wheelchair ? ? ? ? ?Assist Is the patient using a wheelchair?: Yes ?Type of Wheelchair: Manual ?   ? ?Wheelchair assist level: Dependent - Patient 0% ?Max wheelchair distance: 150  ? ? ?Wheelchair 50 feet with 2 turns activity ? ? ? ?Assist ? ?  ?  ? ? ?Assist Level: Dependent - Patient 0%  ? ?Wheelchair 150 feet activity  ? ? ? ?Assist ?   ? ? ?Assist Level: Dependent - Patient 0%  ? ?Blood pressure 136/70, pulse 71, temperature 97.8 ?F (36.6 ?C), temperature source Oral, resp. rate 17, height '5\' 2"'$  (1.575 m), weight 63.4 kg, SpO2 94 %. ? ?Medical Problem List and Plan: ?1. 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            -patient may shower ?            -ELOS/Goals: 14-20 days, min assist to supervision goals  ? -Continue CIR therapies including PT, OT  ? Discussed prognosis for upper extremity strength improvement. ?2.  Impaired mobility: continue Lovenox ?            -antiplatelet therapy: Plavix and aspirin ?3. Pain Management: Tylenol ?4. Mood: LCSW to evaluate and provide emotional support ?            -antipsychotic agents: Not applicable ?5. Neuropsych: This patient is capable of making decisions on her own behalf. ?6. Skin/Wound Care: Routine skin care checks ?7. Fluids/Electrolytes/Nutrition: Routine I's and O's and follow-up chemistries ?Dysphagia , MBS performed at OSH ?            -- D2/nectar diet currently ?8: DM-2: carb modified diet ?--CBGs and SSI (no home meds) ?CBG (last 3)  ?Recent Labs  ?  12/02/21 ?0617 12/02/21 ?1150 12/02/21 ?8185  ?GLUCAP 168* 123* 117*  ?Controlled 3/24--continue with diet control ? ?9: Seasonal allergies: Continue symptomatic relief as needed ?10: GERD: Continue Protonix ?11: COPD: Continue albuterol nebs as needed (no home meds)- smoking hx  ?            --no supplemental O2 needs ?12: Hypokalemia: resolved with supplementation>>>continue and follow-up BMP ? ?  Latest Ref Rng & Units 11/26/2021  ?  5:26 AM 11/20/2021  ?  5:29 AM 08/26/2021  ?  8:45 AM  ?BMP   ?Glucose 70 - 99 mg/dL 98   111   179    ?BUN 8 - 23 mg/dL '17   20   11    '$ ?Creatinine 0.44 - 1.00 mg/dL 0.72   0.69   0.78    ?BUN/Creat Ratio 12 - 28   14    ?Sodium 135 - 145 mmol/L 135   133   135    ?  Potassium 3.5 - 5.1 mmol/L 4.4   4.0   3.6    ?Chloride 98 - 111 mmol/L 102   99   99    ?CO2 22 - 32 mmol/L '25   24   21    '$ ?Calcium 8.9 - 10.3 mg/dL 9.5   9.6   9.9    ?Recheck on 3/21 normal ==f/u Tuesday ? ?13: Hyperlipidemia: intolerant of all statins (no other alternatives listed on home meds) ?14: Hypertension: Controlled off home amlodipine ?             ?Vitals:  ? 12/02/21 0420 12/02/21 1444  ?BP: (!) 160/67 136/70  ?Pulse: (!) 57 71  ?Resp: 18 17  ?Temp: 97.8 ?F (36.6 ?C) 97.8 ?F (36.6 ?C)  ?SpO2: 97% 94%  ? Uncontrolled but improving 3/22 resume amlodipine started '5mg'$  on 3/20, monitor for 2-3 d prior to dose adjustment  ? 3/27: improving control, continue amlodipine ?15: Chronic right knee pain: will monitor how this affects therapy , no NSAIDs due to CVA , may use tylenol  ? 16.  Neurogenic bladder - spastic , due to CVA ,  ? -UCX + for E Coli, sensitive to keflex ?  -continue keflex for 7 days ? -consider antispasmodic potentially ?17. Left sided upper extremity weakness: resting hand splint ordered.  ? ? ?LOS: ?13 days ?A FACE TO FACE EVALUATION WAS PERFORMED ? ?Elizabeth [name] ?12/02/2021, 5:10 PM  ? ? ? ?

## 2021-12-02 NOTE — Progress Notes (Signed)
Speech Language Pathology Daily Session Note ? ?Patient Details  ?Name: Kasia Trego Cocco ?MRN: [id number] ?Date of Birth: July 28, 1941 ? ?Today's Date: 12/02/2021 ?SLP Individual Time: 0900-1000 ?SLP Individual Time Calculation (min): 60 min ? ?Short Term Goals: ?Week 2: SLP Short Term Goal 1 (Week 2): Patient will consume current diet without overt s/sx of aspiration with min A verbal cues for use of swallowing compensatory strategies ?SLP Short Term Goal 2 (Week 2): Patient will utilize speech intelligiblity strategies at the sentence level with min A  cues to achieve 75%+ intelligiblity ?SLP Short Term Goal 3 (Week 2): Patient will complete mildly complex problem solving tasks with min A verbal/visual cues ?SLP Short Term Goal 4 (Week 2): Patient will demonstrate awareness to errors during functional and novel tasks with min A verbal/visual cues ?SLP Short Term Goal 5 (Week 2): Patient will recall and implement up to 2 beneficial compensatory memory strategies to recall new, daily information with min-to-mod A verbal and question cues ? ?Skilled Therapeutic Interventions: Skilled ST treatment focused on swallowing and cognitive goals. SLP facilitated session by providing set-up for oral care prior to consumption of thin liquid PO trials. Pt educated on importance of oral care to minimize risk of adverse events related to aspiration. Patient consumed ice chips and tsp of thin liquids, tsp measuring 2.5 cc and 5cc. Patient exhibited delayed AP transit, suspected swallow delay, and immediate s/sx of aspiration during 75% of trials (5 of 7 instances). Trials were discontinued due to increased symptoms of aspiration. Patient and spouse were educated of recommendations to continue current diet and nectar thick liquids with continuation of thin liquid trials with SLP only at this time. Both verbalized understanding with teach back. Patient discussed meal that she received yesterday which contained rice. 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 This scenario was suggestive of decreased decision making/problem solving skills, as patient stated she still continued to consume the rice despite knowing it would upset her stomach which caused her frustration. SLP educated to avoid foods that typically cause upset stomach. Patient also informed SLP of other food sensitivities/"allergies". SLP notified nurse of sensitivity to rice and mushrooms. Patient was left in bed with alarm activated and immediate needs within reach at end of session. Continue per current plan of care.   ?   ?Pain ?Pain Assessment ?Pain Scale: 0-10 ?Pain Score: 0-No pain ? ?Therapy/Group: Individual Therapy ? ?Rilea Arutyunyan T Odell Fasching ?12/02/2021, 9:59 AM ?

## 2021-12-03 DIAGNOSIS — I6381 Other cerebral infarction due to occlusion or stenosis of small artery: Secondary | ICD-10-CM | POA: Diagnosis not present

## 2021-12-03 LAB — BASIC METABOLIC PANEL
Anion gap: 6 (ref 5–15)
BUN: 17 mg/dL (ref 8–23)
CO2: 28 mmol/L (ref 22–32)
Calcium: 10.1 mg/dL (ref 8.9–10.3)
Chloride: 101 mmol/L (ref 98–111)
Creatinine, Ser: 0.77 mg/dL (ref 0.44–1.00)
GFR, Estimated: >60 mL/min (ref >60)
Glucose, Bld: 119 mg/dL — ABNORMAL HIGH (ref 70–99)
Potassium: 4 mmol/L (ref 3.5–5.1)
Sodium: 135 mmol/L (ref 135–145)

## 2021-12-03 LAB — GLUCOSE, CAPILLARY
Glucose-Capillary: 118 mg/dL — ABNORMAL HIGH (ref 70–99)
Glucose-Capillary: 124 mg/dL — ABNORMAL HIGH (ref 70–99)
Glucose-Capillary: 112 mg/dL — ABNORMAL HIGH (ref 70–99)
Glucose-Capillary: 140 mg/dL — ABNORMAL HIGH (ref 70–99)

## 2021-12-03 LAB — CBC
HCT: 39.3 % (ref 36.0–46.0)
Hemoglobin: 13.1 g/dL (ref 12.0–15.0)
MCH: 27.4 pg (ref 26.0–34.0)
MCHC: 33.3 g/dL (ref 30.0–36.0)
MCV: 82.2 fL (ref 80.0–100.0)
Platelets: 247 10*3/uL (ref 150–400)
RBC: 4.78 MIL/uL (ref 3.87–5.11)
RDW: 13.9 % (ref 11.5–15.5)
WBC: 5.5 10*3/uL (ref 4.0–10.5)
nRBC: 0 % (ref 0.0–0.2)

## 2021-12-03 MED ORDER — AMLODIPINE BESYLATE 10 MG PO TABS
10.0000 mg | ORAL_TABLET | Freq: Every day | ORAL | Status: DC
  Administered 2021-12-04 – 2021-12-11 (×8): 10 mg via ORAL
  Filled 2021-12-03 (×8): qty 1

## 2021-12-03 NOTE — Progress Notes (Signed)
Occupational Therapy Session Note ? ?Patient Details  ?Name: Elizabeth [name] ?MRN: [id number] ?Date of Birth: 30-Apr-1941 ? ?Today's Date: 12/03/2021 ?OT Group Time: 9622-2979 ?OT Group Time Calculation (min): 60 min ? ? ?Short Term Goals: ?Week 2:  OT Short Term Goal 1 (Week 2): Pt will don pants with min A and AE PRN. ?OT Short Term Goal 2 (Week 2): Pt will complete ambulatory toilet transfer with min A. ?OT Short Term Goal 3 (Week 2): Pt will complete standing grooming task with no more than CGA. ? ?Skilled Therapeutic Interventions/Progress Updates:  ?Pt participated in group session with a focus on w/c mgmt and BUE strength and endurance to facilitate improved activity tolerance and strength for higher level BADLs and functional mobility tasks.  ?Session started with therapeutic activity of engaging in seated corn hole game to promote improved attention to w/c mgmt and BUE coordination. Pt needed total A to reposition w/c when it was her turn and MOD cues to remember to lock brakes. Pt utilized RUE to toss bean bags. ?Pt also engaged in seated therapeutic activity game where pts were instructed to roll large dice,  once a number was determined each number correlated to an UB exercise and a number of reps, exercises included bicep curls, tricep extensions, upright rows, flys, chest presses and punches. Repetitions ranged from 10-20. ?Pt chose to use 1 lb weights during session, pt utilizing mostly RUE only but did use hand over hand assist to incorporate LUE into tasks with no weight when cued. Eduction provided during activity of various modifications for all exercises.  ?Ended session with guide deep breathing for 1 min. Discussed benefits of deep breathing to manage stress and pain. Pt returned to room by this OTA.  ? ?Therapy Documentation ?Precautions:  ?Precautions ?Precautions: Fall ?Precaution Comments: L hemiparesis ?Restrictions ?Weight Bearing Restrictions: No ? ?Pain: 5/10 pain reported in low back, rest  breaks provided as needed.  ? ? ?Therapy/Group: Group Therapy ? ?Precious Haws ?12/03/2021, 4:06 PM ?

## 2021-12-03 NOTE — Progress Notes (Signed)
Orthopedic Tech Progress Note ?Patient Details:  ?Elizabeth [name] ?June 12, 1941 ?[id number] ? ?Patient ID: ALYIA LACERTE, female   DOB: [birth year], 81 y.o.   MRN: [id number] ?Called brace order into [facility] ?Karolee Stamps ?12/03/2021, 5:52 AM ? ?

## 2021-12-03 NOTE — Progress Notes (Signed)
Physical Therapy Session Note ? ?Patient Details  ?Name: Elizabeth [name] ?MRN: [id number] ?Date of Birth: 1940-11-03 ? ?Today's Date: 12/03/2021 ?PT Individual Time: 9381-8299 ?PT Individual Time Calculation (min): 31 min  ? ?Short Term Goals: ?Week 1:  PT Short Term Goal 1 (Week 1): Pt will transfer to [facility] with min assist and LRAD ?PT Short Term Goal 1 - Progress (Week 1): Met ?PT Short Term Goal 2 (Week 1): Pt will ambulate with RW x 35f with mod assist ?PT Short Term Goal 2 - Progress (Week 1): Met ?PT Short Term Goal 3 (Week 1): Pt will propell WC 1040fwith min assist ?PT Short Term Goal 3 - Progress (Week 1): Met ?Week 2:  PT Short Term Goal 1 (Week 2): Pt will perform bed mobiltiy with min assist consistently ?PT Short Term Goal 2 (Week 2): Pt will ambulate 7573fith min assist consistently ?PT Short Term Goal 3 (Week 2): Pt [facility] consistently [facility] ?PT Short Term Goal 4 (Week 2): Pt will ascend 4 steps with min assist and rails as needed ? ?Skilled Therapeutic Interventions/Progress Updates:  ?Patient supine in bed on entrance to room. Patient alert and agreeable to PT session. Husband present ? ?Patient with no pain complaint throughout session. ? ?Therapeutic Activity: ?Bed Mobility: Patient performed supine --> sit with supervision. No vc required, but extra time to complete.  ?Transfers: Patient performed sit<>stand and stand pivot transfers throughout session with MinA and improving to CGA/ supervision. Provided verbal cues for taking time to slow down and safely pivot to seat. Pt tends to discard walker ?prior to descent to sit. Squat pivot using no AD from bed to w/c.  ? ?W/C mobility performed from room to mid nurse's station with BLE and RUE use. Requires vc throughout to maintain L foot ahead of w/c. ? ?Gait Training:  ?Patient ambulated 120' using RW with CGA/ MinA. Demonstrated fast pace, decreased step height/ length with LLE, Attention to task. Provided vc/ tc throughout for attn to  task and correction of above impairments. When pt rushes, quality and balance break down.  ? ?Patient seated upright  in recliner at end of session with brakes locked, belt alarm set, and all needs within reach. ? ? ?Therapy Documentation ?Precautions:  ?Precautions ?Precautions: Fall ?Precaution Comments: L hemiparesis ?Restrictions ?Weight Bearing Restrictions: No ?General: ?  ?Vital Signs: ? ?Pain: ?Pain Assessment ?Pain Scale: 0-10 ?Pain Score: 0-No pain ? ?Therapy/Group: Individual Therapy ? ?JulAlger Simons, DPT ?12/03/2021, 12:37 PM  ?

## 2021-12-03 NOTE — Progress Notes (Signed)
Speech Language Pathology Daily Session Note ? ?Patient Details  ?Name: Elizabeth [name] ?MRN: [id number] ?Date of Birth: [birth year] ? ?Today's Date: 12/03/2021 ?SLP Individual Time: 1100-1200 ?SLP Individual Time Calculation (min): 60 min ? ?Short Term Goals: ?Week 2: SLP Short Term Goal 1 (Week 2): Patient will consume current diet without overt s/sx of aspiration with min A verbal cues for use of swallowing compensatory strategies ?SLP Short Term Goal 2 (Week 2): Patient will utilize speech intelligiblity strategies at the sentence level with min A  cues to achieve 75%+ intelligiblity ?SLP Short Term Goal 3 (Week 2): Patient will complete mildly complex problem solving tasks with min A verbal/visual cues ?SLP Short Term Goal 4 (Week 2): Patient will demonstrate awareness to errors during functional and novel tasks with min A verbal/visual cues ?SLP Short Term Goal 5 (Week 2): Patient will recall and implement up to 2 beneficial compensatory memory strategies to recall new, daily information with min-to-mod A verbal and question cues ? ?Skilled Therapeutic Interventions: Skilled ST treatment focused on speech, swallowing, and cognitive goals. Patient exhibited slightly more congestion without the presence of PO intake. She consumed sips of nectar thick liquids by straw without overt s/sx of aspiration with sup A cues to take small, single sips. SLP facilitated session by providing min A verbal cues to implement speech intelligibility strategies (i.e., over articulate, pause between words, reduce rate) to achieve >75% intelligibility at the word and sentence level. Pt more difficult to understand with unknown contexts. SLP left patient with multisyllabic word list for practice as part of HEP. SLP facilitated error awareness and problem solving with mildly complex medication management task. Pt required overall mod A fading for identification of organization errors in daily BID pillbox; sup A for generating appropriate  solutions. Patient exhibited comprehension of medication labels/instructions with 25% accuracy without cues, and max A verbal and visual to achieve 75% accuracy. Pt known to read through labels quickly with poor self monitoring and max A for awareness of errors. Patient was left in recliner with alarm activated and immediate needs within reach at end of session. Continue per current plan of care.   ?   ?Pain ?Pain Assessment ?Pain Scale: 0-10 ?Pain Score: 0-No pain ? ?Therapy/Group: Individual Therapy ? ?Elizabeth [name] ?12/03/2021, 11:31 AM ?

## 2021-12-03 NOTE — Progress Notes (Signed)
Occupational Therapy Session Note ? ?Patient Details  ?Name: Elizabeth [name] ?MRN: [id number] ?Date of Birth: [birth year] ? ?Today's Date: 12/03/2021 ?OT Individual Time: 0982-8675 ?OT Individual Time Calculation (min): 44 min  ? ? ?Short Term Goals: ?Week 1:  OT Short Term Goal 1 (Week 1): Pt will maintain balance seated EOB > 8 min with no more than CGA in prep for seated ADL. ?OT Short Term Goal 1 - Progress (Week 1): Met ?OT Short Term Goal 2 (Week 1): Pt will complete toilet transfer with max A and LRAD. ?OT Short Term Goal 2 - Progress (Week 1): Met ?OT Short Term Goal 3 (Week 1): Pt will complete LBD with max A and LRAD. ?OT Short Term Goal 3 - Progress (Week 1): Met ?OT Short Term Goal 4 (Week 1): Pt will don shirt with mod A. ?OT Short Term Goal 4 - Progress (Week 1): Met ?Week 2:  OT Short Term Goal 1 (Week 2): Pt will don pants with min A and AE PRN. ?OT Short Term Goal 2 (Week 2): Pt will complete ambulatory toilet transfer with min A. ?OT Short Term Goal 3 (Week 2): Pt will complete standing grooming task with no more than CGA. ? ?Skilled Therapeutic Interventions/Progress Updates:  ?  Pt received semi-reclined in bed, no c/o pain but reports poor sleep due to noise, agreeable to therapy. Session focus on self-care retraining, activity tolerance, transfer retraining in prep for improved ADL/IADL/func mobility performance + decreased caregiver burden. Came to sitting EOB on her R with min A to progress LLE off bed and to lift trunk, cues for initiation/sequencing steps. Min A to maintain static sitting balance due to L lean. Donned pants with overall max A to thread BLE and to pull up over L hip, min A for sit to stand and short ambulatory transfer > w/c / assist needed to maintain L grip on RW. Completed grooming tasks seated at sink with set-up A of materials, mod hand over hand assist to incorporate L hand as stabilizer to open items. Donned shirt with light min A to pull down in back.  ? ?Ambulatory toilet  transfer with RW and overall min A to maintain L grip and for steadying assist due to mild impulsivity with transfer > BSC/toilet. Mod A to manage clothing over L hip, close S for seated anterior pericare post incontinent void of bladder. Stood to wash hands at sink and complete oral care with overall min A for steadying assist/prevent posterior lean. ? ? ?Pt left seated in w/c with lab tech present, safety belt alarm engaged, call bell in reach, and all immediate needs met.  ? ? ?Therapy Documentation ?Precautions:  ?Precautions ?Precautions: Fall ?Precaution Comments: L hemiparesis ?Restrictions ?Weight Bearing Restrictions: No ? ?Pain: no c/o ?  ?ADL: See Care Tool for more details. ? ? ?Therapy/Group: Individual Therapy ? ?Volanda Napoleon MS, OTR/L ? ?12/03/2021, 6:51 AM ?

## 2021-12-03 NOTE — Progress Notes (Signed)
?                                                       PROGRESS NOTE ? ? ?Subjective/Complaints: ? ?No issues overnite , discussed BP with RN, pt and husband  ? ?ROS: Patient denies CP, SOB, N/V/D ? ? ? ?Objective: ?  ?No results found. ?Recent Labs  ?  12/03/21 ?0750  ?WBC 5.5  ?HGB 13.1  ?HCT 39.3  ?PLT 247  ? ? ?Recent Labs  ?  12/03/21 ?0750  ?NA 135  ?K 4.0  ?CL 101  ?CO2 28  ?GLUCOSE 119*  ?BUN 17  ?CREATININE 0.77  ?CALCIUM 10.1  ? ? ? ?Intake/Output Summary (Last 24 hours) at 12/03/2021 0847 ?Last data filed at 12/03/2021 0700 ?Gross per 24 hour  ?Intake 960 ml  ?Output --  ?Net 960 ml  ? ?  ? ?  ? ?Physical Exam: ?Vital Signs ?Blood pressure (!) 155/72, pulse 61, temperature 97.9 ?F (36.6 ?C), temperature source Oral, resp. rate 16, height '5\' 2"'$  (1.575 m), weight 63.4 kg, SpO2 97 %. ? ?General: No acute distress ?Mood and affect are appropriate ?Heart: Regular rate and rhythm no rubs murmurs or extra sounds ?Lungs: Clear to auscultation, breathing unlabored, no rales or wheezes ?Abdomen: Positive bowel sounds, soft nontender to palpation, nondistended ?Extremities: No clubbing, cyanosis, or edema ? ?Skin: No evidence of breakdown, no evidence of rash ?Neurologic: reasonable insight and awareness. No language issues.  Cranial nerves II through XII intact, motor strength is 5/5 in RIght and 4-/5 left deltoid, bicep, tricep, grip, hip flexor, knee extensors, ankle dorsiflexor and plantar flexor-no changes ?Sensory exam normal for light touch and pain in all 4 limbs. No limb ataxia or cerebellar signs. No abnormal tone appreciated.    ?Musculoskeletal: Full range of motion in all 4 extremities. No joint swelling ? ? ? ?Assessment/Plan: ?1. Functional deficits which require 3+ hours per day of interdisciplinary therapy in a comprehensive inpatient rehab setting. ?Physiatrist is providing close team supervision and 24 hour management of active medical problems listed below. ?Physiatrist and rehab team continue to  assess barriers to discharge/monitor patient progress toward functional and medical goals ? ?Care Tool: ? ?Bathing ?   ?Body parts bathed by patient: Left arm, Chest, Abdomen, Right upper leg, Left upper leg, Right lower leg, Left lower leg, Face, Front perineal area  ? Body parts bathed by helper: Buttocks, Right arm ?  ?  ?Bathing assist Assist Level: Moderate Assistance - Patient 50 - 74% ?  ?  ?Upper Body Dressing/Undressing ?Upper body dressing   ?What is the patient wearing?: Pull over shirt ?   ?Upper body assist Assist Level: Minimal Assistance - Patient > 75% ?   ?Lower Body Dressing/Undressing ?Lower body dressing ? ? ?   ?What is the patient wearing?: Underwear/pull up, Pants ? ?  ? ?Lower body assist Assist for lower body dressing: Moderate Assistance - Patient 50 - 74% ?   ? ?Toileting ?Toileting Toileting Activity did not occur (Probation officer and hygiene only): N/A (no void or bm)  ?Toileting assist Assist for toileting: Moderate Assistance - Patient 50 - 74% ?  ?  ?Transfers ?Chair/bed transfer ? ?Transfers assist ?   ? ?Chair/bed transfer assist level: Minimal Assistance - Patient > 75% ?  ?  ?Locomotion ?Ambulation ? ? ?  Ambulation assist ? ? Ambulation activity did not occur: Safety/medical concerns ? ?  ?  ?   ? ?Walk 10 feet activity ? ? ?Assist ? Walk 10 feet activity did not occur: Safety/medical concerns ? ?  ?   ? ?Walk 50 feet activity ? ? ?Assist Walk 50 feet with 2 turns activity did not occur: Safety/medical concerns ? ?  ?   ? ? ?Walk 150 feet activity ? ? ?Assist Walk 150 feet activity did not occur: Safety/medical concerns ? ?  ?  ?  ? ?Walk 10 feet on uneven surface  ?activity ? ? ?Assist Walk 10 feet on uneven surfaces activity did not occur: Safety/medical concerns ? ? ?  ?   ? ?Wheelchair ? ? ? ? ?Assist Is the patient using a wheelchair?: Yes ?Type of Wheelchair: Manual ?  ? ?Wheelchair assist level: Dependent - Patient 0% ?Max wheelchair distance: 150  ? ? ?Wheelchair 50  feet with 2 turns activity ? ? ? ?Assist ? ?  ?  ? ? ?Assist Level: Dependent - Patient 0%  ? ?Wheelchair 150 feet activity  ? ? ? ?Assist ?   ? ? ?Assist Level: Dependent - Patient 0%  ? ?Blood pressure (!) 155/72, pulse 61, temperature 97.9 ?F (36.6 ?C), temperature source Oral, resp. rate 16, height '5\' 2"'$  (1.575 m), weight 63.4 kg, SpO2 97 %. ? ?Medical Problem List and Plan: ?1. 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            -patient may shower ?            -ELOS/Goals: 14-20 days, min assist to supervision goals , team conf in am  ? -Continue CIR therapies including PT, OT  ? Discussed prognosis for upper extremity strength improvement. ?2.  Impaired mobility: continue Lovenox ?            -antiplatelet therapy: Plavix and aspirin ?3. Pain Management: Tylenol ?4. Mood: LCSW to evaluate and provide emotional support ?            -antipsychotic agents: Not applicable ?5. Neuropsych: This patient is capable of making decisions on her own behalf. ?6. Skin/Wound Care: Routine skin care checks ?7. Fluids/Electrolytes/Nutrition: Routine I's and O's and follow-up chemistries ?Dysphagia , MBS performed at OSH ?            -- D2/nectar diet currently ?8: DM-2: carb modified diet ?--CBGs and SSI (no home meds) ?CBG (last 3)  ?Recent Labs  ?  12/02/21 ?1614 12/02/21 ?2116 12/03/21 ?4782  ?GLUCAP 117* 122* 118*  ? ?Controlled 3/28 ? ?9: Seasonal allergies: Continue symptomatic relief as needed ?10: GERD: Continue Protonix ?11: COPD: Continue albuterol nebs as needed (no home meds)- smoking hx  ?            --no supplemental O2 needs ?12: Hypokalemia: resolved with supplementation f/u BMET stable 3/28 ? ?  Latest Ref Rng & Units 12/03/2021  ?  7:50 AM 11/26/2021  ?  5:26 AM 11/20/2021  ?  5:29 AM  ?BMP  ?Glucose 70 - 99 mg/dL 119   98   111    ?BUN 8 - 23 mg/dL '17   17   20    '$ ?Creatinine 0.44 - 1.00 mg/dL 0.77   0.72    0.69    ?Sodium 135 - 145 mmol/L 135   135   133    ?  Potassium 3.5 - 5.1 mmol/L 4.0   4.4   4.0    ?Chloride 98 - 111 mmol/L 101   102   99    ?CO2 22 - 32 mmol/L '28   25   24    '$ ?Calcium 8.9 - 10.3 mg/dL 10.1   9.5   9.6    ? ? ?13: Hyperlipidemia: intolerant of all statins (no other alternatives listed on home meds) ?14: Hypertension: Controlled off home amlodipine ?             ?Vitals:  ? 12/02/21 1926 12/03/21 0443  ?BP: 135/63 (!) 155/72  ?Pulse: 71 61  ?Resp: 18 16  ?Temp: 98.5 ?F (36.9 ?C) 97.9 ?F (36.6 ?C)  ?SpO2: 97% 97%  ? Early am elevation monitor prior to med adjustment  ?15: Chronic right knee pain: will monitor how this affects therapy , no NSAIDs due to CVA , may use tylenol  ? 16.  Neurogenic bladder - spastic , due to CVA ,  ? -UCX + for E Coli, sensitive to keflex ?  -continue keflex for 7 days ? -consider antispasmodic potentially ?17. Left sided upper extremity weakness: resting hand splint ordered.  ? ? ?LOS: ?14 days ?A FACE TO FACE EVALUATION WAS PERFORMED ? ?Luanna Salk Carmon Brigandi ?12/03/2021, 8:47 AM  ? ? ? ?

## 2021-12-04 LAB — GLUCOSE, CAPILLARY
Glucose-Capillary: 106 mg/dL — ABNORMAL HIGH (ref 70–99)
Glucose-Capillary: 115 mg/dL — ABNORMAL HIGH (ref 70–99)
Glucose-Capillary: 108 mg/dL — ABNORMAL HIGH (ref 70–99)
Glucose-Capillary: 120 mg/dL — ABNORMAL HIGH (ref 70–99)
Glucose-Capillary: 127 mg/dL — ABNORMAL HIGH (ref 70–99)

## 2021-12-04 NOTE — Patient Care Conference (Signed)
Inpatient RehabilitationTeam Conference and Plan of Care Update ?Date: 12/04/2021   Time: 10:08 AM  ? ? ?Patient Name: Elizabeth [name]      ?Medical Record Number: [id number]  ?Date of Birth: February 14, 1941 ?Sex: Female         ?Room/Bed: 6E95M/8U13K-44 ?Payor Info: Payor: [employer] / Plan: [facility] MEDICARE / Product Type: *No Product type* /   ? ?Admit Date/Time:  11/19/2021 12:34 PM ? ?Primary Diagnosis:  Right-sided lacunar stroke ([place]) ? ?Hospital Problems: Principal Problem: ?  Right-sided lacunar stroke [facility]) ? ? ? ?Expected Discharge Date: Expected Discharge Date: 12/11/21 ? ?Team Members Present: ?Physician leading conference: Dr. Alysia Penna ?Social Worker Present: Erlene Quan, BSW ?Nurse Present: Dorien Chihuahua, RN ?PT Present: Barrie Folk, PT ?OT Present: Providence Lanius, OT ?SLP Present: Sherren Kerns, SLP ?PPS Coordinator present : Ileana Ladd, PT ? ?   Current Status/Progress Goal Weekly Team Focus  ?Bowel/Bladder ? ? patient incontinent of bowel and bladder, some episodes of continence, LBM 3/28  increase continent episodes      ?Swallow/Nutrition/ Hydration ? ? dys 2, nectar thick liquids, sup A  sup A  trials of thin liquids (water only) via ice chips/tsp, phryngeal strengthening   ?ADL's ? ? S UBD, mod LBD, mod to max bathing seated shower level, min toileting/ambulatory toilet transfer with RW/L saddle splint; L lean/poor trunk control in sitting, LUE brunstrum level III, hand IV  CGA overall  LUE NMR, self-care/balance/transfer retraining, pt/family/DME/AE education, activity tolerance   ?Mobility ? ? moderately impulsive. min assist bed mobility to CGA. CGA -min assist transfers and gait with RW with max cues for safety  overall CGA, MinA for steps  L sided attention. improved safety. imrpoved balance, coordation on the LLE/LUE. family education   ?Communication ? ? min A speech intelligiblity at the word and sentence level  sup A  speech strategies, awareness and repair of  speech breakdowns   ?Safety/Cognition/ Behavioral Observations ? min-to-mod A  sup A  problem solving, recall, memory strategies, emergent awareness   ?Pain ? ? denies pain  denies pain      ?Skin ? ? skin intact  skin intact      ? ? ?Discharge Planning:  ?discharging home with spouse   ?Team Discussion: ?Patient with chronic cough; MD ordered flutter valve. Progress limited by left lean and inattention, fatigue, arrousal fluctuation with mid line disorientation and poor safety awareness.   ? ?Patient on target to meet rehab goals: ?yes, currently needs supervision for upper body bathing and dressing; seated at the sink and mod for lower body care. Needs min assist for toileing using a RW. Needs min - mod assist for mild complex issues and min assist for use of speech strategies. ? ?*See Care Plan and progress notes for long and short-term goals.  ? ?Revisions to Treatment Plan:  ?Swap resting hand splint out with regular splint for flexion tendonitis ? ?Teaching Needs: ?Swallowing exercises and pharyngeal strengthening, safety awareness, medication management, secondary risk management, etc.  ?Current Barriers to Discharge: ?Decreased caregiver support ? ?Possible Resolutions to Barriers: ?Family education ?HH follow up services ?DME; already has recommended items ?  ? ? Medical Summary ?Current Status: poor pulmonary toilet , completing abx for UTI , Left wrist ? Barriers to Discharge: Other (comments);Incontinence ?  ?Possible Resolutions to [employer]: will order wrist splint rather than resting hand splint, needs flutter valve ? ? ?Continued Need for Acute Rehabilitation Level of Care: The patient requires daily medical  management by a physician with specialized training in physical medicine and rehabilitation for the following reasons: ?Direction of a multidisciplinary physical rehabilitation program to maximize functional independence : Yes ?Medical management of patient stability for increased  activity during participation in an intensive rehabilitation regime.: Yes ?Analysis of laboratory values and/or radiology reports with any subsequent need for medication adjustment and/or medical intervention. : Yes ? ? ?I attest that I was present, lead the team conference, and concur with the assessment and plan of the team. ? ? ?Dorien Chihuahua B ?12/04/2021, 4:36 PM  ? ? ? ? ? ? ?

## 2021-12-04 NOTE — Progress Notes (Signed)
Physical Therapy Weekly Progress Note ? ?Patient Details  ?Name: Elizabeth [name] ?MRN: [id number] ?Date of Birth: [birth year] ? ?Beginning of progress report period: November 27, 2021 ?End of progress report period: December 04, 2021 ? ?Today's Date: 12/04/2021 ?PT Individual Time: 1660-6301 and 6010-9323 ?PT Individual Time Calculation (min): 24 min and 55 min  ? ?Patient has met 4 of 4 short term goals.  Pt is making steady progress towards goals of CGA-supervision assist from PT with gait and transfers. Noted improved midline and decreaesd impulsivity which has greatly improved safety. Family education planned to start 4/1.  ? ?Patient continues to demonstrate the following deficits muscle weakness, decreased cardiorespiratoy endurance, motor apraxia and decreased coordination, field cut, decreased attention to left, decreased attention, decreased awareness, decreased problem solving, decreased safety awareness, decreased memory, and delayed processing, and decreased sitting balance, decreased standing balance, decreased postural control, hemiplegia, and decreased balance strategies and therefore will continue to benefit from skilled PT intervention to increase functional independence with mobility. ? ?Patient progressing toward long term goals..  Continue plan of care. ? ?PT Short Term Goals ?Week 2:  PT Short Term Goal 1 (Week 2): Pt will perform bed mobiltiy with min assist consistently ?PT Short Term Goal 1 - Progress (Week 2): Met ?PT Short Term Goal 2 (Week 2): Pt will ambulate 20f with min assist consistently ?PT Short Term Goal 2 - Progress (Week 2): Met ?PT Short Term Goal 3 (Week 2): [facility] ?PT Short Term Goal 3 - Progress (Week 2): Met ?PT Short Term Goal 4 (Week 2): Pt will ascend 4 steps with min assist and rails as needed ?PT Short Term Goal 4 - Progress (Week 2): Met ?Week 3:  PT Short Term Goal 1 (Week 3): STG=LTG due to ELOS ? ?Skilled Therapeutic Interventions/Progress  Updates:  ?Session 1  ?Pt received sitting in WC and agreeable to PT. Gait training with RW to and from day room with CGA-supervision assist from PT x [id number]. Pt noted to have significantly improved midline orientation, weight shifting, from the last time that this PT saw this patient.  Dynamic gait training to step over 1inch obstacles 6 x 2 with mod assist on first bout due to impulsivity and min assist and second bout with improved sequencing and AD management as well as increased step length on the L side over obstacles. Patient returned to room and performed stand pivot to recliner with RW and CGA with turn to the R. Pt left sitting in recliner with call bell in reach and all needs met.   ?  ?Session 2.  ?Pt received supine in bed and agreeable to PT. Supine>sit transfer with supervision assist and increased time with HOB elevated and heavy use of rails.  Stand pivot [facility] assist and RW. Pt transported to orthogym. Dynamic balance and visual scanning task to perform BITS visual scanning 2 x 1 min BUE and sequencing A-O and 1-10,A-J performed x 2 BUE with hand over hand assist on the LUE. Sit<>[facility] from [facility] assist fom ultra hemiheight and then CGA from hemiheight WC.  ?Pt returned to room and performed stand pivot transfer to bed with RW and CGA. Sit>supine completed with increased time and supervision assist. Pt left supine in bed with call bell in reach and all needs met.  ? ? ?   ? ?Therapy Documentation ?Precautions:  ?Precautions ?Precautions: Fall ?Precaution Comments: L hemiparesis ?Restrictions ?Weight Bearing Restrictions:  No ? ?Pain: ? Denies throughout day in both sessions ? ? ?Therapy/Group: Individual Therapy ? ?[name] ?12/04/2021, 12:18 PM  ?

## 2021-12-04 NOTE — Progress Notes (Signed)
Orthopedic Tech Progress Note ?Patient Details:  ?Elizabeth [name] ?01-12-1941 ?[id number] ? ?Called in order to [employer] for a [facility] [facility]  ? ?Patient ID: Elizabeth [name], female   DOB: 03-29-1941, 81 y.o.   MRN: [id number] ? ?[name] ?12/04/2021, 11:20 AM ? ?

## 2021-12-04 NOTE — Progress Notes (Incomplete)
?                                                       PROGRESS NOTE ? ? ?Subjective/Complaints: ? ? ? ?ROS: Patient denies CP, SOB, N/V/D ? ? ?Labs reviewed  ?Objective: ?  ?No results found. ?Recent Labs  ?  12/03/21 ?0750  ?WBC 5.5  ?HGB 13.1  ?HCT 39.3  ?PLT 247  ? ? ? ?Recent Labs  ?  12/03/21 ?0750  ?NA 135  ?K 4.0  ?CL 101  ?CO2 28  ?GLUCOSE 119*  ?BUN 17  ?CREATININE 0.77  ?CALCIUM 10.1  ? ? ? ? ?Intake/Output Summary (Last 24 hours) at 12/04/2021 0931 ?Last data filed at 12/03/2021 1748 ?Gross per 24 hour  ?Intake 360 ml  ?Output --  ?Net 360 ml  ? ?  ? ?  ? ?Physical Exam: ?Vital Signs ?Blood pressure (!) 147/62, pulse 67, temperature 97.6 ?F (36.4 ?C), temperature source Oral, resp. rate 18, height _0  (1.575 m), weight 63.9 kg, SpO2 99 %. ? ?General: No acute distress ?Mood and affect are appropriate ?Heart: Regular rate and rhythm no rubs murmurs or extra sounds ?Lungs: Clear to auscultation, breathing unlabored, no rales or wheezes ?Abdomen: Positive bowel sounds, soft nontender to palpation, nondistended ?Extremities: No clubbing, cyanosis, or edema ? ?Skin: No evidence of breakdown, no evidence of rash ?Neurologic: reasonable insight and awareness. No language issues.  Cranial nerves II through XII intact, motor strength is 5/5 in RIght and 4-/5 left deltoid, bicep, tricep, grip, hip flexor, knee extensors, ankle dorsiflexor and plantar flexor-no changes ?Sensory exam normal for light touch and pain in all 4 limbs. No limb ataxia or cerebellar signs. No abnormal tone appreciated.    ?Musculoskeletal: Full range of motion in all 4 extremities. No joint swelling ? ? ? ?Assessment/Plan: ?1. Functional deficits which require 3+ hours per day of interdisciplinary therapy in a comprehensive inpatient rehab setting. ?Physiatrist is providing close team supervision and 24 hour management of active medical problems listed below. ?Physiatrist and rehab team continue to assess barriers to discharge/monitor  patient progress toward functional and medical goals ? ?Care Tool: ? ?Bathing ?   ?Body parts bathed by patient: Left arm, Chest, Abdomen, Right upper leg, Left upper leg, Right lower leg, Left lower leg, Face, Front perineal area  ? Body parts bathed by helper: Buttocks, Right arm ?  ?  ?Bathing assist Assist Level: Moderate Assistance - Patient 50 - 74% ?  ?  ?Upper Body Dressing/Undressing ?Upper body dressing   ?What is the patient wearing?: Pull over shirt ?   ?Upper body assist Assist Level: Minimal Assistance - Patient > 75% ?   ?Lower Body Dressing/Undressing ?Lower body dressing ? ? ?   ?What is the patient wearing?: Underwear/pull up, Pants ? ?  ? ?Lower body assist Assist for lower body dressing: Moderate Assistance - Patient 50 - 74% ?   ? ?Toileting ?Toileting Toileting Activity did not occur (Probation officer and hygiene only): N/A (no void or bm)  ?Toileting assist Assist for toileting: Moderate Assistance - Patient 50 - 74% ?  ?  ?Transfers ?Chair/bed transfer ? ?Transfers assist ?   ? ?Chair/bed transfer assist level: Minimal Assistance - Patient > 75% ?  ?  ?Locomotion ?Ambulation ? ? ?Ambulation assist ? ? Ambulation activity  did not occur: Safety/medical concerns ? ?  ?  ?   ? ?Walk 10 feet activity ? ? ?Assist ? Walk 10 feet activity did not occur: Safety/medical concerns ? ?  ?   ? ?Walk 50 feet activity ? ? ?Assist Walk 50 feet with 2 turns activity did not occur: Safety/medical concerns ? ?  ?   ? ? ?Walk 150 feet activity ? ? ?Assist Walk 150 feet activity did not occur: Safety/medical concerns ? ?  ?  ?  ? ?Walk 10 feet on uneven surface  ?activity ? ? ?Assist Walk 10 feet on uneven surfaces activity did not occur: Safety/medical concerns ? ? ?  ?   ? ?Wheelchair ? ? ? ? ?Assist Is the patient using a wheelchair?: Yes ?Type of Wheelchair: Manual ?  ? ?Wheelchair assist level: Dependent - Patient 0% ?Max wheelchair distance: 150  ? ? ?Wheelchair 50 feet with 2 turns  activity ? ? ? ?Assist ? ?  ?  ? ? ?Assist Level: Dependent - Patient 0%  ? ?Wheelchair 150 feet activity  ? ? ? ?Assist ?   ? ? ?Assist Level: Dependent - Patient 0%  ? ?Blood pressure (!) 147/62, pulse 67, temperature 97.6 ?F (36.4 ?C), temperature source Oral, resp. rate 18, height _0  (1.575 m), weight 63.9 kg, SpO2 99 %. ? ?Medical Problem List and Plan: ?1. 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            -patient may shower ?            -ELOS/Goals: 14-20 days, min assist to supervision goals , Team conference today please see physician documentation under team conference tab, met with team  to discuss problems,progress, and goals. Formulized individual treatment plan based on medical history, underlying problem and comorbidities. ?  ? -Continue CIR therapies including PT, OT  ? Discussed prognosis for upper extremity strength improvement. ?2.  Impaired mobility: continue Lovenox ?            -antiplatelet therapy: Plavix and aspirin ?3. Pain Management: Tylenol ?4. Mood: LCSW to evaluate and provide emotional support ?            -antipsychotic agents: Not applicable ?5. Neuropsych: This patient is capable of making decisions on her own behalf. ?6. Skin/Wound Care: Routine skin care checks ?7. Fluids/Electrolytes/Nutrition: Routine I's and O's and follow-up chemistries ?Dysphagia , MBS performed at OSH ?            -- D2/nectar diet currently ?8: DM-2: carb modified diet ?--CBGs and SSI (no home meds) ?CBG (last 3)  ?Recent Labs  ?  12/03/21 ?2137 12/04/21 ?1610 12/04/21 ?0610  ?GLUCAP 140* 106* 115*  ? ?Controlled 3/29 ? ?9: Seasonal allergies: Continue symptomatic relief as needed ?10: GERD: Continue Protonix ?11: COPD: Continue albuterol nebs as needed (no home meds)- smoking hx  ?            --no supplemental O2 needs ?12: Hypokalemia: resolved with supplementation f/u BMET stable 3/28 ? ?  Latest  Ref Rng & Units 12/03/2021  ?  7:50 AM 11/26/2021  ?  5:26 AM 11/20/2021  ?  5:29 AM  ?BMP  ?Glucose 70 - 99 mg/dL 119   98   111    ?BUN 8 - 23 mg/dL _1 ?Creatinine 0.44 - 1.00 mg/dL 0.77  0.72   0.69    ?Sodium 135 - 145 mmol/L 135   135   133    ?Potassium 3.5 - 5.1 mmol/L 4.0   4.4   4.0    ?Chloride 98 - 111 mmol/L 101   102   99    ?CO2 22 - 32 mmol/L _0 ?Calcium 8.9 - 10.3 mg/dL 10.1   9.5   9.6    ? ? ?13: Hyperlipidemia: intolerant of all statins (no other alternatives listed on home meds) ?14: Hypertension: Controlled off home amlodipine ?             ?Vitals:  ? 12/03/21 1937 12/04/21 0430  ?BP: (!) 142/64 (!) 147/62  ?Pulse: 78 67  ?Resp: 18 18  ?Temp: 97.6 ?F (36.4 ?C) 97.6 ?F (36.4 ?C)  ?SpO2: 98% 99%  ? Early am elevation monitor prior to med adjustment  ?15: Chronic right knee pain: will monitor how this affects therapy , no NSAIDs due to CVA , may use tylenol  ? 16.  Neurogenic bladder - spastic , due to CVA ,  ? -UCX + for E Coli, sensitive to keflex ?  -completed keflex ?17. Left sided upper extremity weakness: resting hand splint ordered.  ? ? ?LOS: ?15 days ?A FACE TO FACE EVALUATION WAS PERFORMED ? ?Luanna Salk Jaylise Peek ?12/04/2021, 9:31 AM  ? ? ? ?

## 2021-12-04 NOTE — Progress Notes (Signed)
Occupational Therapy Session Note ? ?Patient Details  ?Name: Eunie Lawn Ruffner ?MRN: [id number] ?Date of Birth: Oct 30, 1940 ? ?Today's Date: 12/04/2021 ?OT Individual Time: [phone] ?OT Individual Time Calculation (min): 58 min  ? ? ?Short Term Goals: ?Week 1:  OT Short Term Goal 1 (Week 1): Pt will maintain balance seated EOB > 8 min with no more than CGA in prep for seated ADL. ?OT Short Term Goal 1 - Progress (Week 1): Met ?OT Short Term Goal 2 (Week 1): Pt will complete toilet transfer with max A and LRAD. ?OT Short Term Goal 2 - Progress (Week 1): Met ?OT Short Term Goal 3 (Week 1): Pt will complete LBD with max A and LRAD. ?OT Short Term Goal 3 - Progress (Week 1): Met ?OT Short Term Goal 4 (Week 1): Pt will don shirt with mod A. ?OT Short Term Goal 4 - Progress (Week 1): Met ?Week 2:  OT Short Term Goal 1 (Week 2): Pt will don pants with min A and AE PRN. ?OT Short Term Goal 2 (Week 2): Pt will complete ambulatory toilet transfer with min A. ?OT Short Term Goal 3 (Week 2): Pt will complete standing grooming task with no more than CGA. ? ?Skilled Therapeutic Interventions/Progress Updates:  ?  Pt received semi-reclined in bed, no c/o pain, agreeable to therapy. Session focus on self-care retraining, activity tolerance, LUE NMR, transfer retraining in prep for improved ADL/IADL/func mobility performance + decreased caregiver burden. Came to sitting EOB with min A to progress LLE and to lift trunk with use of bed rail. CGA for static sitting balance for mild L lean. Ambulatory transfer > chair at sink with light min A to keep L hand on RW. Bathed UB with overall S due to mild L lean. Donned shirt herself with only S cues to pull down all the way on the L. Mod A to thread BLE and pull up over L hip when donning pants/brief.  Completed oral care in standing with CGA and set-up A of materials. Able to utilize L hand as stabilizer with no physical intervention for grooming tasks this am.  ? ?Self fed 100% of 2 breakfast  items, cues to take slow/small sips per SLP swallowing sheet, 2 episodes of coughing. Rearranged tray to promote L attention, min cues to locate yogurt on her L, able to hold in her L hand while she ate it. ? ?Finally, participated in the following putty therex: pinch and pull, putty squeezes, rolling into log shapes. Frequent cues throughout to use her L hand vs R.  ? ?Discussed f/u recs and DME needs upon DC with husband present. ? ?Pt left seated in recliner with husband present with safety belt alarm engaged, call bell in reach, and all immediate needs met.  ? ? ?Therapy Documentation ?Precautions:  ?Precautions ?Precautions: Fall ?Precaution Comments: L hemiparesis ?Restrictions ?Weight Bearing Restrictions: No ? ?Pain: reports ongoing LUE soreness from previous therex ?  ?ADL: See Care Tool for more details. ? ?Therapy/Group: Individual Therapy ? ?Volanda Napoleon MS, OTR/L ? ?12/04/2021, 6:49 AM ?

## 2021-12-04 NOTE — Progress Notes (Signed)
Speech Language Pathology Weekly Progress and Session Note ? ?Patient Details  ?Name: Elizabeth [name] ?MRN: [id number] ?Date of Birth: 28-Oct-1940 ? ?Beginning of progress report period: November 27, 2021 ?End of progress report period: December 04, 2021 ? ?Today's Date: 12/04/2021 ?SLP Individual Time: [id number] ?SLP Individual Time Calculation (min): 60 min ? ?Short Term Goals: ?Week 2: SLP Short Term Goal 1 (Week 2): Patient will consume current diet without overt s/sx of aspiration with min A verbal cues for use of swallowing compensatory strategies ?SLP Short Term Goal 1 - Progress (Week 2): Met ?SLP Short Term Goal 2 (Week 2): Patient will utilize speech intelligiblity strategies at the sentence level with min A  cues to achieve 75%+ intelligiblity ?SLP Short Term Goal 2 - Progress (Week 2): Met ?SLP Short Term Goal 3 (Week 2): Patient will complete mildly complex problem solving tasks with min A verbal/visual cues ?SLP Short Term Goal 3 - Progress (Week 2): Met ?SLP Short Term Goal 4 (Week 2): Patient will demonstrate awareness to errors during functional and novel tasks with min A verbal/visual cues ?SLP Short Term Goal 4 - Progress (Week 2): Met ?SLP Short Term Goal 5 (Week 2): Patient will recall and implement up to 2 beneficial compensatory memory strategies to recall new, daily information with min-to-mod A verbal and question cues ?SLP Short Term Goal 5 - Progress (Week 2): Met ? ?New Short Term Goals: ?Week 3: SLP Short Term Goal 1 (Week 3): STG=LTG due to ELOS ? ?Weekly Progress Updates: Patient has made excellent gains and has met 5 of 5 STGs this reporting period. Patient is currently completing cognitive tasks with overall sup-to-min A verbal cues to complete functional and mildly complex tasks accurately and safely. SLP continues to address mildly complex problem solving, functional recall, error awareness, and emergent awareness of deficits. Patient is currently implementing speech intelligible strategies at  the sentence level with min A verbal cues with more consistent carry over. She is currently consuming a dys 2 diet and nectar thick liquids with intermittent s/sx of aspiration and sup A cues to implement safe swallowing strategies and precautions. Patient has been participating in dys 3 texture trials and thin liquid trials via ice chips and tsp of water, however demonstrating significant s/sx of aspiration particularly with liquid intake and thus does not yet appear to be a candidate for instrumental testing.  ?SLP recommends pt continue with current diet and full supervision with all PO intake. Patient and family education is ongoing. Patient would benefit from continued skilled SLP intervention to maximize cognitive, speech, and swallow functioning prior to discharge.   ?  ?Intensity: Minumum of 1-2 x/day, 30 to 90 minutes ?Frequency: 3 to 5 out of 7 days ?Duration/Length of Stay: 4/5 ?Treatment/Interventions: Cognitive remediation/compensation;Internal/external aids;Speech/Language facilitation;Cueing hierarchy;Dysphagia/aspiration precaution training;Functional tasks;Patient/family education;Therapeutic Activities ? ?Daily Session ?Skilled Therapeutic Interventions: Skilled ST treatment focused on swallowing and cognitive goals. Assisted with placing adhesive on upper denture prior to dys 3 PO trials. Patient consumed with mildly prolonged yet effective mastication, mild oral residuals, and overt s/sx of aspiration x1. Pt required sup A verbal cues to minimize talking while masticating bolus. Following trial patient continued to cough throughout session but unable to detect if attributed to PO. Pt reported increased phlegm today. Recommend patient continue with dys 2 diet and nectar thick liquids. Continued dys 3 trials and consideration of trial tray prior to possible diet advancement. SLP then facilitated mildly complex problem solving, reasoning, and alternating attention task with novel card  game "Blink"  with overall sup A verbal cues for error awareness and attention. SLP facilitated education and functional discussion on compensatory memory strategies. Following education patient recalled and elaborated on up to 3 beneficial strategies with sup A verbal cues. Patient was left in bed with alarm activated and immediate needs within reach at end of session. Continue per current plan of care.     ? ?General  ?  ?Pain ? None ? ?Therapy/Group: Individual Therapy ? ?Aizley Stenseth T Theon Sobotka ?12/04/2021, 4:24 PM ? ? ? ? ? ? ?

## 2021-12-04 NOTE — Progress Notes (Signed)
Patient ID: Elizabeth [name], female   DOB: [birth year], 81 y.o.   MRN: [id number] ? ?SW made attempt 2x to see patient spouse and 2x phone call. SW left VM to schedule family education. SW will continue to follow up ?

## 2021-12-05 DIAGNOSIS — I6381 Other cerebral infarction due to occlusion or stenosis of small artery: Secondary | ICD-10-CM | POA: Diagnosis not present

## 2021-12-05 LAB — GLUCOSE, CAPILLARY
Glucose-Capillary: 123 mg/dL — ABNORMAL HIGH (ref 70–99)
Glucose-Capillary: 99 mg/dL (ref 70–99)
Glucose-Capillary: 97 mg/dL (ref 70–99)
Glucose-Capillary: 135 mg/dL — ABNORMAL HIGH (ref 70–99)

## 2021-12-05 NOTE — Progress Notes (Signed)
Speech Language Pathology Daily Session Note ? ?Patient Details  ?Name: Elizabeth [name] ?MRN: [id number] ?Date of Birth: November 18, 1940 ? ?Today's Date: 12/05/2021 ?SLP Individual Time: 1030-1100 ?SLP Individual Time Calculation (min): 30 min ? ?Short Term Goals: ?Week 3: SLP Short Term Goal 1 (Week 3): STG=LTG due to ELOS ? ?Skilled Therapeutic Interventions: Skilled ST treatment focused on swallowing goals and education with spouse. SLP facilitated education on swallowing deficits, swallowing precautions/strategies, diet recommendations, and demonstration on thickening liquids. Patient was reclined <25 degrees and seen taking sips of nectar thick liquids upon arrival. SLP educated on optimal positioning for PO intake. Pt/spouse verbalized understanding however continued to consume liquids from this position; spouse also with no attempts to raise HOB. SLP repositioned patient in bed to demonstrate optimal positioning. Patient consumed nectar thick liquids by straw with delayed cough x2 with sequential sips; no coughing evidenced with single, small sips. Plan for dys 3 trial tray with noon meal today. Patient was left in bed with alarm activated and immediate needs within reach at end of session. Continue per current plan of care.   ?   ?Addendum 1210: kitchen sent dys 2 textures with lunch tray therefore unable to trial dys 3 tray. Will attempt again tomorrow. Diet order changed back to dys 2, nectar thick liquids. ? ?Pain ?Pain Assessment ?Pain Scale: 0-10 ?Pain Score: 0-No pain ? ?Therapy/Group: Individual Therapy ? ?Candra Wegner T Trayveon Beckford ?12/05/2021, 11:00 AM ?

## 2021-12-05 NOTE — Progress Notes (Signed)
Patient ID: Elizabeth [name], female   DOB: [birth year], 81 y.o.   MRN: [id number] ? ?Patient approved by [facility] ?

## 2021-12-05 NOTE — Progress Notes (Signed)
Occupational Therapy Weekly Progress Note ? ?Patient Details  ?Name: Elizabeth [name] ?MRN: [id number] ?Date of Birth: 05-Jun-1941 ? ?Beginning of progress report period: November 28, 2021 ?End of progress report period: December 05, 2021 ? ?Today's Date: 12/05/2021 ?OT Individual Time: 3570-1779 ?OT Individual Time Calculation (min): 54 min + 48 min ? ? ?Patient has met 2 of 3 short term goals.  Pt has made good progress this week towards LTG. Pt has demonstrated improved static standing and sitting balance, activity tolerance, ability to compensate for deficits, functional use of L hand to presently complete UBD at S level, LBD at mod level, ambulatory bathroom transfers at min A with RW, and toileting tasks with min to mod A. Pt cont to be primarily limited by LUE HP, L inattention, cognitive deficits, L lean in sitting/standing. LUE and hand currently assessed at Brummstrom level III and IV respectively. Anticipate 24/7 S and CGA to min physical assist required upon DC. ? ? ?Patient continues to demonstrate the following deficits: muscle weakness, decreased cardiorespiratoy endurance, impaired timing and sequencing, abnormal tone, unbalanced muscle activation, decreased coordination, and decreased motor planning, decreased midline orientation and decreased attention to left, decreased attention, decreased awareness, decreased problem solving, decreased safety awareness, and decreased memory, and decreased sitting balance, decreased standing balance, decreased postural control, hemiplegia, and decreased balance strategies and therefore will continue to benefit from skilled OT intervention to enhance overall performance with BADL and Reduce care partner burden. ? ?Patient progressing toward long term goals..  Continue plan of care. ? ?OT Short Term Goals ?Week 1:  OT Short Term Goal 1 (Week 1): Pt will maintain balance seated EOB > 8 min with no more than CGA in prep for seated ADL. ?OT Short Term Goal 1 - Progress (Week 1):  Met ?OT Short Term Goal 2 (Week 1): Pt will complete toilet transfer with max A and LRAD. ?OT Short Term Goal 2 - Progress (Week 1): Met ?OT Short Term Goal 3 (Week 1): Pt will complete LBD with max A and LRAD. ?OT Short Term Goal 3 - Progress (Week 1): Met ?OT Short Term Goal 4 (Week 1): Pt will don shirt with mod A. ?OT Short Term Goal 4 - Progress (Week 1): Met ?Week 2:  OT Short Term Goal 1 (Week 2): Pt will don pants with min A and AE PRN. ?OT Short Term Goal 1 - Progress (Week 2): Not met ?OT Short Term Goal 2 (Week 2): Pt will complete ambulatory toilet transfer with min A. ?OT Short Term Goal 2 - Progress (Week 2): Met ?OT Short Term Goal 3 (Week 2): Pt will complete standing grooming task with no more than CGA. ?OT Short Term Goal 3 - Progress (Week 2): Met ?Week 3:  OT Short Term Goal 1 (Week 3): STG = LTG 2/2 ELOS ? ?Skilled Therapeutic Interventions/Progress Updates:  ?  Session 1 [phone]): Pt received supine in bed, no c/o pain, agreeable to therapy with encouragement and increased time given to initiate exiting bed. Session focus on self-care retraining, activity tolerance, transfer retraining in prep for improved ADL/IADL/func mobility performance + decreased caregiver burden. Came to sitting EOB min A to progress LLE off bed and to lift trunk, cues for LUE placement on bed rail. Ambulatory toilet transfer min A due to L lean and L hand slipping off grip with poor awareness. Max A for brief management as it was falling off pt. Continent void of bladder CGA for seated pericare due to L lean. Doffed  shirt at sink with min A to pull over head, poor recall of hemi techniques. Donned shirt with S, cues to pull down over L side. Donned pants mod A to thread LLE and to pull up in back over brief, assist for L grip to pull up over L hip. Stood to complete oral care / grooming tasks, min A given for set-up of materials this date, pt able to open more items this date with encouragement and using L hand as  stabilizer.  ? ?Self-fed breakfast with S, pt able to open yogurt/syrup with encouragement to use L hand. Continues to have oral spillage on her L side, requires cues to clean off.  ? ?Pt completed 2x10 biceps curls and L shoulder flexion/extension, required max A to facilitate full ROM. ? ?Pt left seated in recliner with safety belt alarm engaged, call bell in reach, and all immediate needs met.  ? ? ?Session 2 [phone]): Pt received semi-reclined in bed, no c/o pain, agreeable to therapy. Session focus on LUE NMR in prep for improved ADL/IADL/func mobility performance + decreased caregiver burden. Came to sitting EOB with min A to progress LLE off bed and to lift trunk.  Reports almost near fall when walking to the bathroom and requests to use stedy, educated that she has progressed to no longer need stedy but updated safety plan to stand-pivot from w/c for pt comfort at this time. Short ambulatory transfer > with RW w/c with min A to correct L lean/keep L hand on RW ,  posterior LOB with light mod A to correct when backing into w/c. Total A w/c transport to and from gym.  ? ?Seated at bits, participated in the following games to target L visual scanning, L attention, and LUE NMR: ? -Single Target x3; pt required mod physical assist and use of splint to hold stylus to reach targets. Frequent cues throughout to not use R hand to hit targets.  ? Gloriann Loan Cancellation Task: 4 min 18 seconds, required direct visual and verbal cuing to locate majority of targets on her L and R periphery. ? ?Finally, 3x10 of forward shoulder flexion/elbow extension + internal/external rotation with use of BZ board and dycem to maintain L grip.  ? ?Pt left seated in recliner with safety belt alarm engaged, call bell in reach, and all immediate needs met.  ? ? ?Therapy Documentation ?Precautions:  ?Precautions ?Precautions: Fall ?Precaution Comments: L hemiparesis ?Restrictions ?Weight Bearing Restrictions: No ? ?Pain: see session notes ?   ?ADL: See Care Tool for more details. ? ?Therapy/Group: Individual Therapy ? ?Volanda Napoleon MS, OTR/L ? ?12/05/2021, 6:50 AM  ?

## 2021-12-05 NOTE — Progress Notes (Signed)
Physical Therapy Session Note ? ?Patient Details  ?Name: Elizabeth [name] ?MRN: [id number] ?Date of Birth: 1941-01-03 ? ?Today's Date: 12/05/2021 ?PT Individual Time: 0910-1005 ?PT Individual Time Calculation (min): 55 min  ? ?Short Term Goals: ? ?Week 3:  PT Short Term Goal 1 (Week 3): STG=LTG due to ELOS ? ?Skilled Therapeutic Interventions/Progress Updates:  ? ?Pt received sitting in WC and agreeable to PT ? ?Gait training through hall with RW and CGA-min assist from PT for improved weight shift to the R and awareness of lateral LOB to the L x 154f and x 1815f  ? ?Dynamic gait training to perform foot tap on 6 cones with LLE to force improved weight shift R and facilitate increased step height on the LLE.  ? ?StAutomotive engineerith Lrail ascending only performed step to pattern leading with the RLE x 1 bouts and LLE x 2 bouts noted to have significant lateral lean to the L when performing step to pattern on descent with the LLE, but able to control midline with descent leading with the RLE  ? ?Dynamic balance training to pick blocks up from the floor with min assist and LUE supported on RW. Cues to return to midline once in standing following each lift from the floor.  ? ?Pt returned to room and performed stand pivot transfer to bed with RW and CGA with cues for position on EOB. Sit>supine completed with supervision assist and increased time for LLE management and left supine in bed with call bell in reach and all needs met.  ? ?   ? ?Therapy Documentation ?Precautions:  ?Precautions ?Precautions: Fall ?Precaution Comments: L hemiparesis ?Restrictions ?Weight Bearing Restrictions: No ? ? ? ?Pain: ?Pain Assessment ?Pain Scale: 0-10 ?Pain Score: 0-No pain ? ? ?Therapy/Group: Individual Therapy ? ?AuLorie Phenix3/30/2023, 10:09 AM  ?

## 2021-12-05 NOTE — Progress Notes (Signed)
?                                                       PROGRESS NOTE ? ? ?Subjective/Complaints: ? ?No breathing issues, Left wrist feels ok in splint  ? ?ROS: Patient denies CP, SOB, N/V/D ? ? ? ?Objective: ?  ?No results found. ?Recent Labs  ?  12/03/21 ?0750  ?WBC 5.5  ?HGB 13.1  ?HCT 39.3  ?PLT 247  ? ? ? ?Recent Labs  ?  12/03/21 ?0750  ?NA 135  ?K 4.0  ?CL 101  ?CO2 28  ?GLUCOSE 119*  ?BUN 17  ?CREATININE 0.77  ?CALCIUM 10.1  ? ? ? ? ?Intake/Output Summary (Last 24 hours) at 12/05/2021 0855 ?Last data filed at 12/04/2021 1801 ?Gross per 24 hour  ?Intake 1036 ml  ?Output --  ?Net 1036 ml  ? ?  ? ?  ? ?Physical Exam: ?Vital Signs ?Blood pressure 134/63, pulse (!) 59, temperature (!) 97.5 ?F (36.4 ?C), resp. rate 16, height '5\' 2"'$  (1.575 m), weight 63.9 kg, SpO2 97 %. ? ?General: No acute distress ?Mood and affect are appropriate ?Heart: Regular rate and rhythm no rubs murmurs or extra sounds ?Lungs: Clear to auscultation, breathing unlabored, no rales or wheezes ?Abdomen: Positive bowel sounds, soft nontender to palpation, nondistended ?Extremities: No clubbing, cyanosis, or edema ? ?Skin: No evidence of breakdown, no evidence of rash ?Neurologic: reasonable insight and awareness. No language issues.  Cranial nerves II through XII intact, motor strength is 5/5 in RIght and 4-/5 left deltoid, bicep, tricep, grip, hip flexor, knee extensors, ankle dorsiflexor and plantar flexor-no changes ?Sensory exam normal for light touch and pain in all 4 limbs. No limb ataxia or cerebellar signs. No abnormal tone appreciated.    ?Musculoskeletal: Full range of motion in all 4 extremities. No joint swelling ? ? ? ?Assessment/Plan: ?1. Functional deficits which require 3+ hours per day of interdisciplinary therapy in a comprehensive inpatient rehab setting. ?Physiatrist is providing close team supervision and 24 hour management of active medical problems listed below. ?Physiatrist and rehab team continue to assess barriers to  discharge/monitor patient progress toward functional and medical goals ? ?Care Tool: ? ?Bathing ?   ?Body parts bathed by patient: Left arm, Chest, Abdomen, Right upper leg, Left upper leg, Right lower leg, Left lower leg, Face, Front perineal area  ? Body parts bathed by helper: Buttocks, Right arm ?  ?  ?Bathing assist Assist Level: Moderate Assistance - Patient 50 - 74% ?  ?  ?Upper Body Dressing/Undressing ?Upper body dressing   ?What is the patient wearing?: Pull over shirt ?   ?Upper body assist Assist Level: Supervision/Verbal cueing ?   ?Lower Body Dressing/Undressing ?Lower body dressing ? ? ?   ?What is the patient wearing?: Pants, Incontinence brief ? ?  ? ?Lower body assist Assist for lower body dressing: Moderate Assistance - Patient 50 - 74% ?   ? ?Toileting ?Toileting Toileting Activity did not occur (Probation officer and hygiene only): N/A (no void or bm)  ?Toileting assist Assist for toileting: Moderate Assistance - Patient 50 - 74% ?  ?  ?Transfers ?Chair/bed transfer ? ?Transfers assist ?   ? ?Chair/bed transfer assist level: Minimal Assistance - Patient > 75% ?  ?  ?Locomotion ?Ambulation ? ? ?Ambulation assist ? ? Ambulation  activity did not occur: Safety/medical concerns ? ?  ?  ?   ? ?Walk 10 feet activity ? ? ?Assist ? Walk 10 feet activity did not occur: Safety/medical concerns ? ?  ?   ? ?Walk 50 feet activity ? ? ?Assist Walk 50 feet with 2 turns activity did not occur: Safety/medical concerns ? ?  ?   ? ? ?Walk 150 feet activity ? ? ?Assist Walk 150 feet activity did not occur: Safety/medical concerns ? ?  ?  ?  ? ?Walk 10 feet on uneven surface  ?activity ? ? ?Assist Walk 10 feet on uneven surfaces activity did not occur: Safety/medical concerns ? ? ?  ?   ? ?Wheelchair ? ? ? ? ?Assist Is the patient using a wheelchair?: Yes ?Type of Wheelchair: Manual ?  ? ?Wheelchair assist level: Dependent - Patient 0% ?Max wheelchair distance: 150  ? ? ?Wheelchair 50 feet with 2 turns  activity ? ? ? ?Assist ? ?  ?  ? ? ?Assist Level: Dependent - Patient 0%  ? ?Wheelchair 150 feet activity  ? ? ? ?Assist ?   ? ? ?Assist Level: Dependent - Patient 0%  ? ?Blood pressure 134/63, pulse (!) 59, temperature (!) 97.5 ?F (36.4 ?C), resp. rate 16, height '5\' 2"'$  (1.575 m), weight 63.9 kg, SpO2 97 %. ? ?Medical Problem List and Plan: ?1. 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            -patient may shower ?            -ELOS/Goals: 4/5  min assist to supervision goals , ? -Continue CIR therapies including PT, OT  ? Discussed prognosis for upper extremity strength improvement. ?2.  Impaired mobility: continue Lovenox ?            -antiplatelet therapy: Plavix and aspirin ?3. Pain Management: Tylenol ?4. Mood: LCSW to evaluate and provide emotional support ?            -antipsychotic agents: Not applicable ?5. Neuropsych: This patient is capable of making decisions on her own behalf. ?6. Skin/Wound Care: Routine skin care checks ?7. Fluids/Electrolytes/Nutrition: Routine I's and O's and follow-up chemistries ?Dysphagia , MBS performed at OSH ?            -- D2/nectar diet currently ?8: DM-2: carb modified diet ?--CBGs and SSI (no home meds) ?CBG (last 3)  ?Recent Labs  ?  12/04/21 ?1637 12/04/21 ?2128 12/05/21 ?0618  ?GLUCAP 120* 127* 123*  ? ?Controlled 3/30 ? ?9: Seasonal allergies: Continue symptomatic relief as needed ?10: GERD: Continue Protonix ?11: COPD: Continue albuterol nebs as needed (no home meds)- smoking hx  ?            --no supplemental O2 needs ?12: Hypokalemia: resolved with supplementation f/u BMET stable 3/28 ? ?  Latest Ref Rng & Units 12/03/2021  ?  7:50 AM 11/26/2021  ?  5:26 AM 11/20/2021  ?  5:29 AM  ?BMP  ?Glucose 70 - 99 mg/dL 119   98   111    ?BUN 8 - 23 mg/dL '17   17   20    '$ ?Creatinine 0.44 - 1.00 mg/dL 0.77   0.72   0.69    ?Sodium 135 - 145 mmol/L 135   135   133    ?Potassium  3.5 - 5.1 mmol/L 4.0   4.4   4.0    ?  Chloride 98 - 111 mmol/L 101   102   99    ?CO2 22 - 32 mmol/L '28   25   24    '$ ?Calcium 8.9 - 10.3 mg/dL 10.1   9.5   9.6    ? ? ?13: Hyperlipidemia: intolerant of all statins (no other alternatives listed on home meds) ?14: Hypertension: Controlled off home amlodipine ?             ?Vitals:  ? 12/04/21 1919 12/05/21 0414  ?BP: 122/72 134/63  ?Pulse: 68 (!) 59  ?Resp: 18 16  ?Temp: 98.1 ?F (36.7 ?C) (!) 97.5 ?F (36.4 ?C)  ?SpO2: 98% 97%  ?Controlled 3/30 ?15: Chronic right knee pain: will monitor how this affects therapy , no NSAIDs due to CVA , may use tylenol  ? 16.  Neurogenic bladder - spastic , due to CVA ,  ? -UCX + for E Coli, sensitive to keflex ?  -completed therapy  ?17. Left wrist flexor tendinitis- cont wrist splint  ? ? ?LOS: ?16 days ?A FACE TO FACE EVALUATION WAS PERFORMED ? ?Luanna Salk Cassell Voorhies ?12/05/2021, 8:55 AM  ? ? ? ?

## 2021-12-06 LAB — GLUCOSE, CAPILLARY
Glucose-Capillary: 105 mg/dL — ABNORMAL HIGH (ref 70–99)
Glucose-Capillary: 99 mg/dL (ref 70–99)
Glucose-Capillary: 132 mg/dL — ABNORMAL HIGH (ref 70–99)
Glucose-Capillary: 131 mg/dL — ABNORMAL HIGH (ref 70–99)

## 2021-12-06 NOTE — Progress Notes (Signed)
Speech Language Pathology Daily Session Note ? ?Patient Details  ?Name: Elizabeth [name] ?MRN: [id number] ?Date of Birth: 02/19/1941 ? ?Today's Date: 12/06/2021 ?SLP Individual Time: 0092-3300 ?SLP Individual Time Calculation (min): 45 min ? ?Short Term Goals: ?Week 3: SLP Short Term Goal 1 (Week 3): STG=LTG due to ELOS ? ?Skilled Therapeutic Interventions: Skilled ST treatment focused on swallowing goals with dys 3 trial tray. SLP facilitated placement of adhesive for upper denture prior to consumption of meal. Patient consumed breakfast potatoes, scrambled eggs, and bite sized pieces of sausage while demonstrating prolonged, effortful, and inefficient mastication with "munch" chew pattern; inability to fully masticate breakfast potatoes resulting SLP's request to orally expell into napkin; decreased oral manipulation and containment; mild-to-moderate oral residuals; occasional throat clearing. Patient exhibited some impulsivity and decreased awareness throughout meal. For example, after SLP requested for patient to expel potatoes and pt personally acknowledged they were difficulty to masticate ("I can't chew the potatoes"), she attempted to take additional bites of the potatoes while having minimal awareness of full oral cavity requiring mod A verbal cues to swallow prior to next bite and to avoid potatoes all together. Patient does not appear clinically ready for diet advancement at this time, therefore recommend continuation of dys 2 diet, nectar thick liquids. Educated pt and spouse who verbalized understanding and agreement. SLP provided handouts on Dys 2 textures and preparation, as well as ordering information for nectar thick liquids. Patient was left in recliner with alarm activated and immediate needs within reach at end of session. Continue per current plan of care.   ?   ? ?Pain ?Pain Assessment ?Pain Scale: 0-10 ?Pain Score: 0-No pain ? ?Therapy/Group: Individual Therapy ? ?Dawud Mays T Tyshan Enderle ?12/06/2021, 7:38  AM ?

## 2021-12-06 NOTE — Progress Notes (Signed)
Occupational Therapy Session Note ? ?Patient Details  ?Name: Elizabeth [name] ?MRN: [id number] ?Date of Birth: [birth year] ? ?Today's Date: 12/06/2021 ?OT Individual Time: 5456-2563 ?OT Individual Time Calculation (min): 27 min  ? ? ?Short Term Goals: ?Week 1:  OT Short Term Goal 1 (Week 1): Pt will maintain balance seated EOB > 8 min with no more than CGA in prep for seated ADL. ?OT Short Term Goal 1 - Progress (Week 1): Met ?OT Short Term Goal 2 (Week 1): Pt will complete toilet transfer with max A and LRAD. ?OT Short Term Goal 2 - Progress (Week 1): Met ?OT Short Term Goal 3 (Week 1): Pt will complete LBD with max A and LRAD. ?OT Short Term Goal 3 - Progress (Week 1): Met ?OT Short Term Goal 4 (Week 1): Pt will don shirt with mod A. ?OT Short Term Goal 4 - Progress (Week 1): Met ?Week 2:  OT Short Term Goal 1 (Week 2): Pt will don pants with min A and AE PRN. ?OT Short Term Goal 1 - Progress (Week 2): Not met ?OT Short Term Goal 2 (Week 2): Pt will complete ambulatory toilet transfer with min A. ?OT Short Term Goal 2 - Progress (Week 2): Met ?OT Short Term Goal 3 (Week 2): Pt will complete standing grooming task with no more than CGA. ?OT Short Term Goal 3 - Progress (Week 2): Met ?Week 3:  OT Short Term Goal 1 (Week 3): STG = LTG 2/2 ELOS ? ?Skilled Therapeutic Interventions/Progress Updates:  ?   ?Pt received semi-reclined in bed, no c/o pain, agreeable to therapy if in room. Session focus on activity tolerance, midline orientation, static sitting balance, LUE NMR/attention in prep for improved ADL/IADL/func mobility performance + decreased caregiver burden. Came to sitting EOB with light min A to pivot BLE. Sat EOB to play checkers game, increasing L lean required intermittent mod A to prevent LOB, but pt demoing improved awareness/attempts to self-correct. ? ?Attempted hand over hand assist to incorporate LUE, requiring total A to assist to move pieces. Pt declining further physical assist. Board placed on her L to  force L attention. Reports recalling rules to play checkers, but noted to require several cues for appropriate game play. ? ?Short ambulatory transfer > w/c with min A to prevent L LOB with RW. ? ?Pt left seated in w/c with safety belt alarm engaged, call bell in reach, and all immediate needs met.  ? ?Therapy Documentation ?Precautions:  ?Precautions ?Precautions: Fall ?Precaution Comments: L hemiparesis ?Restrictions ?Weight Bearing Restrictions: No ? ?Pain: ? No c/o ?ADL: See Care Tool for more details. ? ?Therapy/Group: Individual Therapy ? ?Volanda Napoleon MS, OTR/L ? ?12/06/2021, 6:57 AM ?

## 2021-12-06 NOTE — Progress Notes (Signed)
Occupational Therapy Session Note ? ?Patient Details  ?Name: Elizabeth [name] ?MRN: [id number] ?Date of Birth: 1941/07/03 ? ?Today's Date: 12/06/2021 ?OT Group Time: 1000-1100 ?OT Group Time Calculation (min): 60 min ? ? ?Short Term Goals: ?Week 1:  OT Short Term Goal 1 (Week 1): Pt will maintain balance seated EOB > 8 min with no more than CGA in prep for seated ADL. ?OT Short Term Goal 1 - Progress (Week 1): Met ?OT Short Term Goal 2 (Week 1): Pt will complete toilet transfer with max A and LRAD. ?OT Short Term Goal 2 - Progress (Week 1): Met ?OT Short Term Goal 3 (Week 1): Pt will complete LBD with max A and LRAD. ?OT Short Term Goal 3 - Progress (Week 1): Met ?OT Short Term Goal 4 (Week 1): Pt will don shirt with mod A. ?OT Short Term Goal 4 - Progress (Week 1): Met ? ?Skilled Therapeutic Interventions/Progress Updates:  ?  Pt participated in rhythmic drumming group. Pain no reported during session Focus of group on BUE coordination, strengthening, endurance, timing/control, activity tolerance, and social participation and engagement. Pt performs session from seated position for energy conservation. Skilled interventions included cuing for L attention, use of foam handle and coband wrapping to facilitate prolonged grasp. Pt with intermittent attention/use of LUE in bimanual tasks. Graded rhytm movements down to improve accuracy with music. Warm up performed prior to exercises and UB stretching completed at end of group with demo from OT. Pt able to select preferred song to share with group. Returned pt to room at end of session. Exited session with pt seated in bed, exit alarm on and call light in reach ? ? ?Therapy Documentation ?Precautions:  ?Precautions ?Precautions: Fall ?Precaution Comments: L hemiparesis ?Restrictions ?Weight Bearing Restrictions: No ?General: ?  ?Vital Signs: ?Therapy Vitals ?Temp: 97.7 ?F (36.5 ?C) ?Temp Source: Oral ?Pulse Rate: 66 ?Resp: 16 ?BP: (!) 141/51 ?Patient Position (if appropriate):  Lying ?Oxygen Therapy ?SpO2: 96 % ?O2 Device: Room Air ?Pain: ?  ?ADL: ?ADL ?Eating: Supervision/safety ?Where Assessed-Eating: Chair ?Grooming: Moderate assistance (seated EOB) ?Where Assessed-Grooming: Edge of bed ?Upper Body Bathing: Moderate assistance ?Where Assessed-Upper Body Bathing: Edge of bed ?Lower Body Bathing: Maximal assistance ?Where Assessed-Lower Body Bathing: Edge of bed ?Upper Body Dressing: Maximal assistance ?Where Assessed-Upper Body Dressing: Edge of bed ?Lower Body Dressing: Other (Comment), Maximal assistance (+2) ?Where Assessed-Lower Body Dressing: Edge of bed ?Toileting: Not assessed ?Toilet Transfer: Not assessed ?Tub/Shower Transfer: Not assessed ?Walk-In Shower Transfer: Not assessed ?Vision ?  ?Perception  ?  ?Praxis ?  ?Balance ?  ?Exercises: ?  ?Other Treatments:   ? ? ?Therapy/Group: Group Therapy ? ?Lowella Dell Darlisha Kelm ?12/06/2021, 6:58 AM ?

## 2021-12-06 NOTE — Progress Notes (Signed)
Physical Therapy Session Note ? ?Patient Details  ?Name: Elizabeth [name] ?MRN: [id number] ?Date of Birth: 1941/01/07 ? ?Today's Date: 12/06/2021 ?PT Individual Time: 0910-1006 ?PT Individual Time Calculation (min): 56 min  ? ?Short Term Goals: ?Week 2:  PT Short Term Goal 1 (Week 2): Pt will perform bed mobiltiy with min assist consistently ?PT Short Term Goal 1 - Progress (Week 2): Met ?PT Short Term Goal 2 (Week 2): Pt will ambulate 57f with min assist consistently ?PT Short Term Goal 2 - Progress (Week 2): Met ?PT Short Term Goal 3 (Week 2): [facility] ?PT Short Term Goal 3 - Progress (Week 2): Met ?PT Short Term Goal 4 (Week 2): Pt will ascend 4 steps with min assist and rails as needed ?PT Short Term Goal 4 - Progress (Week 2): Met ?Week 3:  PT Short Term Goal 1 (Week 3): STG=LTG due to ELOS ? ?Skilled Therapeutic Interventions/Progress Updates:  ?Patient seated in recliner on entrance to room. Patient alert and agreeable to PT session. Husband present. Pt with L wrist splint on. Demonstrated to pt and husband how to doff/ don.  ? ?Patient with no pain complaint throughout session. ? ?Therapeutic Activity: ?Transfers: Patient performed sit<>stand and stand pivot transfers throughout session with MinA and up to [facility]. Provided verbal cues for hand placement, lateral weight shift, increased use of LLE to push up into full extension and upright posture. Husband trained in providing CGA to MValentineduring sit<>stands and stand pivot transfer. Husband provided with consistent cueing on hand placement on gait belt and pt's hip for balance maintenance. Husband provides good vc for pt's posture and correction of lean.  ? ?Gait Training:  ?Patient ambulated 973 x2 ft using RW with up to [facility]for [facility]. Demonstrated intermittent drag of L foot and L lean especially with increasing pace. Pace increases with decreased attn to L side. Provided vc/ tc for slowing pace and conscious  movement of LLE including increased hip and knee flexion with heel strike. Education provided to husband re: pt's need to slow pace, increase step height/ length, and produce hard heel strike. With focused attn to LLE, pt's gait pattern improves. Husband provides good vc to pt during ambulation for stopping amb when she starts to drag L foot. Cues her to correct and focus attn to stepping through with heel strike.  ? ?Neuromuscular Re-ed: ?NMR facilitated during session with focus on standing balance and LLE muscle recruitment. Pt guided in sit<>stand training on level floor and with forced LLE use with theradisk placed under RLE. Proprioceptive feedback given throughout performances requiring correction of L lean. Pt also guided in balance shift from heels to toes with bkwd LOB requiring up to Mod A to correct despite vc for pt to correct. Pt's attn to balance with ability to self correct is poor, but when asked re: balance and tilt, pt can identify body position and then requires vc to make correction. NMR performed for improvements in motor control and coordination, balance, sequencing, judgement, and self confidence/ efficacy in performing all aspects of mobility at highest level of independence.  ? ?Patient seated in w/c at end of session with brakes locked and all needs within reach while seated in day room with others for group drumming session. ? ? ?Therapy Documentation ?Precautions:  ?Precautions ?Precautions: Fall ?Precaution Comments: L hemiparesis ?Restrictions ?Weight Bearing Restrictions: No ?General: ?  ?Vital Signs: ? ?Pain: ?Pain Assessment ?Pain Scale: 0-10 ?Pain Score: 0-No  pain ? ?Therapy/Group: Individual Therapy ? ?Alger Simons PT, DPT ?12/06/2021, 10:12 AM  ?

## 2021-12-07 LAB — GLUCOSE, CAPILLARY
Glucose-Capillary: 122 mg/dL — ABNORMAL HIGH (ref 70–99)
Glucose-Capillary: 118 mg/dL — ABNORMAL HIGH (ref 70–99)
Glucose-Capillary: 105 mg/dL — ABNORMAL HIGH (ref 70–99)
Glucose-Capillary: 128 mg/dL — ABNORMAL HIGH (ref 70–99)

## 2021-12-08 DIAGNOSIS — I6381 Other cerebral infarction due to occlusion or stenosis of small artery: Secondary | ICD-10-CM | POA: Diagnosis not present

## 2021-12-08 LAB — GLUCOSE, CAPILLARY
Glucose-Capillary: 120 mg/dL — ABNORMAL HIGH (ref 70–99)
Glucose-Capillary: 110 mg/dL — ABNORMAL HIGH (ref 70–99)
Glucose-Capillary: 125 mg/dL — ABNORMAL HIGH (ref 70–99)
Glucose-Capillary: 134 mg/dL — ABNORMAL HIGH (ref 70–99)

## 2021-12-08 MED ORDER — URSODIOL 300 MG PO CAPS
300.0000 mg | ORAL_CAPSULE | Freq: Three times a day (TID) | ORAL | Status: DC
  Administered 2021-12-08 – 2021-12-11 (×9): 300 mg via ORAL
  Filled 2021-12-08 (×10): qty 1

## 2021-12-08 NOTE — Progress Notes (Signed)
?                                                       PROGRESS NOTE ? ? ?Subjective/Complaints: ? ?Pt was on Actigall at home for cirrhosis, brought in supply from home which she wants to use  ? ?ROS: Patient denies CP, SOB, N/V/D ? ? ? ?Objective: ?  ?No results found. ?No results for input(s): WBC, HGB, HCT, PLT in the last 72 hours. ? ? ?No results for input(s): NA, K, CL, CO2, GLUCOSE, BUN, CREATININE, CALCIUM in the last 72 hours. ? ? ? ?Intake/Output Summary (Last 24 hours) at 12/08/2021 1141 ?Last data filed at 12/08/2021 0700 ?Gross per 24 hour  ?Intake 920 ml  ?Output --  ?Net 920 ml  ? ?  ? ?  ? ?Physical Exam: ?Vital Signs ?Blood pressure (!) 150/91, pulse 66, temperature 98.2 ?F (36.8 ?C), resp. rate 18, height '5\' 2"'$  (1.575 m), weight 63.9 kg, SpO2 96 %. ? ?General: No acute distress ?Mood and affect are appropriate ?Heart: Regular rate and rhythm no rubs murmurs or extra sounds ?Lungs: Clear to auscultation, breathing unlabored, no rales or wheezes ?Abdomen: Positive bowel sounds, soft nontender to palpation, nondistended ?Extremities: No clubbing, cyanosis, or edema ? ?Skin: No evidence of breakdown, no evidence of rash ?Neurologic: reasonable insight and awareness. No language issues.  Cranial nerves II through XII intact, motor strength is 5/5 in RIght and 4-/5 left deltoid, bicep, tricep, grip, hip flexor, knee extensors, ankle dorsiflexor and plantar flexor-no changes ?Sensory exam normal for light touch and pain in all 4 limbs. No limb ataxia or cerebellar signs. No abnormal tone appreciated.    ?Musculoskeletal: Full range of motion in all 4 extremities. No joint swelling ? ? ? ?Assessment/Plan: ?1. Functional deficits which require 3+ hours per day of interdisciplinary therapy in a comprehensive inpatient rehab setting. ?Physiatrist is providing close team supervision and 24 hour management of active medical problems listed below. ?Physiatrist and rehab team continue to assess barriers to  discharge/monitor patient progress toward functional and medical goals ? ?Care Tool: ? ?Bathing ?   ?Body parts bathed by patient: Left arm, Chest, Abdomen, Right upper leg, Left upper leg, Right lower leg, Left lower leg, Face, Front perineal area  ? Body parts bathed by helper: Buttocks, Right arm ?  ?  ?Bathing assist Assist Level: Moderate Assistance - Patient 50 - 74% ?  ?  ?Upper Body Dressing/Undressing ?Upper body dressing   ?What is the patient wearing?: Pull over shirt ?   ?Upper body assist Assist Level: Supervision/Verbal cueing ?   ?Lower Body Dressing/Undressing ?Lower body dressing ? ? ?   ?What is the patient wearing?: Pants, Incontinence brief ? ?  ? ?Lower body assist Assist for lower body dressing: Moderate Assistance - Patient 50 - 74% ?   ? ?Toileting ?Toileting Toileting Activity did not occur (Probation officer and hygiene only): N/A (no void or bm)  ?Toileting assist Assist for toileting: Moderate Assistance - Patient 50 - 74% ?  ?  ?Transfers ?Chair/bed transfer ? ?Transfers assist ?   ? ?Chair/bed transfer assist level: Minimal Assistance - Patient > 75% ?  ?  ?Locomotion ?Ambulation ? ? ?Ambulation assist ? ? Ambulation activity did not occur: Safety/medical concerns ? ?  ?  ?   ? ?Walk  10 feet activity ? ? ?Assist ? Walk 10 feet activity did not occur: Safety/medical concerns ? ?  ?   ? ?Walk 50 feet activity ? ? ?Assist Walk 50 feet with 2 turns activity did not occur: Safety/medical concerns ? ?  ?   ? ? ?Walk 150 feet activity ? ? ?Assist Walk 150 feet activity did not occur: Safety/medical concerns ? ?  ?  ?  ? ?Walk 10 feet on uneven surface  ?activity ? ? ?Assist Walk 10 feet on uneven surfaces activity did not occur: Safety/medical concerns ? ? ?  ?   ? ?Wheelchair ? ? ? ? ?Assist Is the patient using a wheelchair?: Yes ?Type of Wheelchair: Manual ?  ? ?Wheelchair assist level: Dependent - Patient 0% ?Max wheelchair distance: 150  ? ? ?Wheelchair 50 feet with 2 turns  activity ? ? ? ?Assist ? ?  ?  ? ? ?Assist Level: Dependent - Patient 0%  ? ?Wheelchair 150 feet activity  ? ? ? ?Assist ?   ? ? ?Assist Level: Dependent - Patient 0%  ? ?Blood pressure (!) 150/91, pulse 66, temperature 98.2 ?F (36.8 ?C), resp. rate 18, height '5\' 2"'$  (1.575 m), weight 63.9 kg, SpO2 96 %. ? ?Medical Problem List and Plan: ?1. 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            -patient may shower ?            -ELOS/Goals: 4/5  min assist to supervision goals , ? -Continue CIR therapies including PT, OT  ? Discussed prognosis for upper extremity strength improvement. ?2.  Impaired mobility: continue Lovenox ?            -antiplatelet therapy: Plavix and aspirin ?3. Pain Management: Tylenol ?4. Mood: LCSW to evaluate and provide emotional support ?            -antipsychotic agents: Not applicable ?5. Neuropsych: This patient is capable of making decisions on her own behalf. ?6. Skin/Wound Care: Routine skin care checks ?7. Fluids/Electrolytes/Nutrition: Routine I's and O's and follow-up chemistries ?Dysphagia , MBS performed at OSH ?            -- D2/nectar diet currently ?8: DM-2: carb modified diet ?--CBGs and SSI (no home meds) ?CBG (last 3)  ?Recent Labs  ?  12/07/21 ?1643 12/07/21 ?2116 12/08/21 ?7915  ?GLUCAP 105* 128* 120*  ? ?Controlled 4/2 ? ?9: Seasonal allergies: Continue symptomatic relief as needed ?10: GERD: Continue Protonix ?11: COPD: Continue albuterol nebs as needed (no home meds)- smoking hx  ?            --no supplemental O2 needs ?12: Hypokalemia: resolved with supplementation f/u BMET stable 3/28 ? ?  Latest Ref Rng & Units 12/03/2021  ?  7:50 AM 11/26/2021  ?  5:26 AM 11/20/2021  ?  5:29 AM  ?BMP  ?Glucose 70 - 99 mg/dL 119   98   111    ?BUN 8 - 23 mg/dL '17   17   20    '$ ?Creatinine 0.44 - 1.00 mg/dL 0.77   0.72   0.69    ?Sodium 135 - 145 mmol/L 135   135   133    ?Potassium 3.5 -  5.1 mmol/L 4.0   4.4   4.0    ?Chloride 98 - 111 mmol/L 101   102   99    ?  CO2 22 - 32 mmol/L '28   25   24    '$ ?Calcium 8.9 - 10.3 mg/dL 10.1   9.5   9.6    ? ? ?13: Hyperlipidemia: intolerant of all statins (no other alternatives listed on home meds) ?14: Hypertension: Controlled off home amlodipine ?             ?Vitals:  ? 12/07/21 2028 12/08/21 0506  ?BP: 138/65 (!) 150/91  ?Pulse: 68 66  ?Resp: 16 18  ?Temp: 98.1 ?F (36.7 ?C) 98.2 ?F (36.8 ?C)  ?SpO2: 94% 96%  ?Mild elevation this am monitor  ?15: Chronic right knee pain: will monitor how this affects therapy , no NSAIDs due to CVA , may use tylenol  ? 16.  Neurogenic bladder - spastic , due to CVA ,  ? -UCX + for E Coli, sensitive to keflex ?  -completed therapy  ?17. Left wrist flexor tendinitis- cont wrist splint  ?18.  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 ?LOS: ?19 days ?A FACE TO FACE EVALUATION WAS PERFORMED ? ?Luanna Salk Kerria Sapien ?12/08/2021, 11:41 AM  ? ? ? ?

## 2021-12-08 NOTE — Progress Notes (Signed)
Speech Language Pathology Daily Session Note ? ?Patient Details  ?Name: Elizabeth [name] ?MRN: [id number] ?Date of Birth: 1941/03/06 ? ?Today's Date: 12/08/2021 ?SLP Individual Time: 4765-4650 ?SLP Individual Time Calculation (min): 60 min ? ?Short Term Goals: ?Week 3: SLP Short Term Goal 1 (Week 3): STG=LTG due to ELOS ? ?Skilled Therapeutic Interventions: ? Pt was seen for skilled ST targeting goals for communication and dysphagia.  SLP facilitated the session with trials of water following oral care to continue working towards diet advancement.  Pt demonstrated immediate coughing on 100% of ice chip and thin liquids via teaspoon trials.  Pt reported "I've always choked easily."   Recommend that pt remain on currently prescribed diet.  During functional conversations with therapist, pt required min assist verbal cues for increased vocal intensity and overarticulation to achieve intelligibility at the sentence level.  Pt was noted to have frequent anterior spillage of saliva dur to decreased labial seal resulting from oral motor weakness.   Pt corrected spillage independently but her decreased management of secretions further impacted speech intelligibility.  Pt requested to use the bathroom at the end of today's therapy session and was handed off to nursing.  Continue per current plan of care.   ? ?Pain ?Pain Assessment ?Pain Scale: 0-10 ?Pain Score: 0-No pain ? ?Therapy/Group: Individual Therapy ? ?Heman Que, Selinda Orion ?12/08/2021, 4:54 PM ?

## 2021-12-09 DIAGNOSIS — K743 Primary biliary cirrhosis: Secondary | ICD-10-CM | POA: Diagnosis not present

## 2021-12-09 DIAGNOSIS — N319 Neuromuscular dysfunction of bladder, unspecified: Secondary | ICD-10-CM

## 2021-12-09 DIAGNOSIS — I10 Essential (primary) hypertension: Secondary | ICD-10-CM | POA: Diagnosis not present

## 2021-12-09 DIAGNOSIS — I6381 Other cerebral infarction due to occlusion or stenosis of small artery: Secondary | ICD-10-CM | POA: Diagnosis not present

## 2021-12-09 LAB — GLUCOSE, CAPILLARY
Glucose-Capillary: 119 mg/dL — ABNORMAL HIGH (ref 70–99)
Glucose-Capillary: 94 mg/dL (ref 70–99)
Glucose-Capillary: 112 mg/dL — ABNORMAL HIGH (ref 70–99)
Glucose-Capillary: 155 mg/dL — ABNORMAL HIGH (ref 70–99)

## 2021-12-09 NOTE — Progress Notes (Signed)
Occupational Therapy Session Note ? ?Patient Details  ?Name: Elizabeth [name] ?MRN: [id number] ?Date of Birth: 01-Apr-1941 ? ?Today's Date: 12/09/2021 ?OT Individual Time: 2103-1281 ?OT Individual Time Calculation (min): 56 min  ? ? ?Short Term Goals: ?Week 1:  OT Short Term Goal 1 (Week 1): Pt will maintain balance seated EOB > 8 min with no more than CGA in prep for seated ADL. ?OT Short Term Goal 1 - Progress (Week 1): Met ?OT Short Term Goal 2 (Week 1): Pt will complete toilet transfer with max A and LRAD. ?OT Short Term Goal 2 - Progress (Week 1): Met ?OT Short Term Goal 3 (Week 1): Pt will complete LBD with max A and LRAD. ?OT Short Term Goal 3 - Progress (Week 1): Met ?OT Short Term Goal 4 (Week 1): Pt will don shirt with mod A. ?OT Short Term Goal 4 - Progress (Week 1): Met ?Week 2:  OT Short Term Goal 1 (Week 2): Pt will don pants with min A and AE PRN. ?OT Short Term Goal 1 - Progress (Week 2): Not met ?OT Short Term Goal 2 (Week 2): Pt will complete ambulatory toilet transfer with min A. ?OT Short Term Goal 2 - Progress (Week 2): Met ?OT Short Term Goal 3 (Week 2): Pt will complete standing grooming task with no more than CGA. ?OT Short Term Goal 3 - Progress (Week 2): Met ?Week 3:  OT Short Term Goal 1 (Week 3): STG = LTG 2/2 ELOS ? ?Patient met lying supine in bed in agreement with OT treatment session. 0/10 pain reported at rest and with activity. Did note pain in hip yesterday but reports decrease in pain after meds given. Patient able to roll to R with Min guard for initiation and cues for hand placement. Sidelying to EOB with light Min A at trunk. Seated EOB patient able to don bilateral socks with assist to maintain sitting balance and cues for hemi technique. Increased time/effort to complete task. Sit to stand x several trials throughout treatment session with Min A and cues for hand placement and orientation to midline. Mobility to commode in bathroom with Min A. Patient with LOB to L with turning  requiring cues for balance. 3/3 parts of toileting task with Min A and increased time for clothing management with LUE. Assist for hygiene for thoroughness.Patient reports difficulty emptying bladder. RN made aware. Walk-in shower transfer with Min A. UB bathing/dressing with set-up assist, increased time/effort and Min cues for hemi technique. LB bathing/dressing with Min-Mod A overall. Session concluded with patient seated in wc with call bell within reach, belt alarm activated and all needs met. NT present for full supervision during breakfast meal.  ? ?Therapy Documentation ?Precautions:  ?Precautions ?Precautions: Fall ?Precaution Comments: L hemiparesis ?Restrictions ?Weight Bearing Restrictions: No ?General: ?  ?Therapy/Group: Individual Therapy ? ?Macintyre Alexa R Howerton-Davis ?12/09/2021, 6:47 AM ?

## 2021-12-09 NOTE — Progress Notes (Signed)
Physical Therapy Session Note ? ?Patient Details  ?Name: Elizabeth [name] ?MRN: [id number] ?Date of Birth: 1941/01/10 ? ?Today's Date: 12/09/2021 ?PT Individual Time: 8182-9937 ?PT Individual Time Calculation (min): 62 min  ? ?Short Term Goals: ?Week 2:  PT Short Term Goal 1 (Week 2): Pt will perform bed mobiltiy with min assist consistently ?PT Short Term Goal 1 - Progress (Week 2): Met ?PT Short Term Goal 2 (Week 2): Pt will ambulate 55f with min assist consistently ?PT Short Term Goal 2 - Progress (Week 2): Met ?PT Short Term Goal 3 (Week 2): Pt will consistently transfer to [facility] ?PT Short Term Goal 3 - Progress (Week 2): Met ?PT Short Term Goal 4 (Week 2): Pt will ascend 4 steps with min assist and rails as needed ?PT Short Term Goal 4 - Progress (Week 2): Met ?Week 3:  PT Short Term Goal 1 (Week 3): STG=LTG due to ELOS ? ?Skilled Therapeutic Interventions/Progress Updates:  ?Patient ambulating out of bathroom with NT on entrance to room. Patient alert and agreeable to PT session.  ? ?Patient with no pain complaint throughout session. ? ?Relates that wearing resting hand splint overnight is helping her wrist.  ? ?Therapeutic Activity: ?Bed Mobility: Patient performed sit-->supine at end of session with MinA for BLE. VC/ tc required for effort and sequence in order to progress foot positioning to middle of bed and bridging for positioning of hips.  ?Transfers: Patient performed sit<>stand and stand pivot transfers throughout session with up to Mod A when pt not focused on LLE use. When focused on task, pt performs sit<>stands with CGA. Stand pivot at end of session using bed rail for UE support with supervision.  Provided verbal cues forcompleting full pivot turn to seat and reaching back for armrest to support controlled descent to sit as pt tends to slide laterally into seat on approach. ? ?Gait Training:  ?Patient ambulated 130' x1 using RW with [facility] to [facility]. +2 available tof w/c follow throughout.  Demonstrated good step through pattern with LLE with continuous cueing for step height. However, without attn to task, pt tends to lose focus and LLE step height/ length decreases to near drag of foot with L balance loss. Will then require stop of amb to find standing balance again prior to restart of GT.  Provided vc/ tc for technique and balance throughout. ? ?Pt guided in stair training with physical demonstration and verbal instructions provided prior to performance. Pt is able to complete four 6" steps using LHR using lateral step progression with minA/ CGA to ascend leading with RLE and to descend leading with LLE. VC provided at initiation of each direction for leading LE.  ? ?Then progressed to step training onto/ off of curb step using RW. Pt is impulsive in performance requiring CGA/ MinA for balance and technique x2.  ? ? ?Wheelchair Mobility:  ?Patient propelled wheelchair 60 feet with MinA for maintaining straight path.. Extensive vc/tc throughout for sequencing RUE with BLE. Pt is unable to coordinate both and can only utilize on or the other for propelling.  ? ?Neuromuscular Re-ed: ?NMR facilitated during session with focus on balance. Pt guided in kicking of cones lined up for RLE kick. Pt ambulates with kick to cones over 65 feet with improved LLE progression and stability. No LOB throughout. NMR performed for improvements in motor control and coordination, balance, sequencing, judgement, and self confidence/ efficacy in performing all aspects of mobility at highest level of independence.  ? ?  Patient supine  in bed at end of session with brakes locked, bed alarm set, and all needs within reach. ? ?Therapy Documentation ?Precautions:  ?Precautions ?Precautions: Fall ?Precaution Comments: L hemiparesis ?Restrictions ?Weight Bearing Restrictions: No ?General: ?  ?Vital Signs: ?Therapy Vitals ?Temp: 98.6 ?F (37 ?C) ?Temp Source: Oral ?Pulse Rate: 81 ?Resp: 17 ?BP: 93/64 ?Patient Position (if  appropriate): Lying ?Oxygen Therapy ?SpO2: 97 % ?O2 Device: Room Air ?Pain: ?Pain Assessment ?Pain Score: 0-No pain ? ?Therapy/Group: Individual Therapy ? ?Alger Simons PT, DPT ?12/09/2021, 5:01 PM  ?

## 2021-12-09 NOTE — Progress Notes (Signed)
Speech Language Pathology Daily Session Note ? ?Patient Details  ?Name: Elizabeth [name] ?MRN: [id number] ?Date of Birth: 09/01/1941 ? ?Today's Date: 12/09/2021 ?SLP Individual Time: 5916-3846 ?SLP Individual Time Calculation (min): 45 min ? ?Short Term Goals: ?Week 3: SLP Short Term Goal 1 (Week 3): STG=LTG due to ELOS ? ?Skilled Therapeutic Interventions:Skilled ST services focused on cognitive skills. SLP facilitated basic-mildly complex problem solving, error awareness, recall and sustained attention in familiar card task Blink. Pt required supervision A verbal cues for both basic and mildly complex problem solving with improvements in error awareness when cards were displayed in pt elected order prior to laying down hand. Pt required supervision A fade to mod I for recall of three rules and the number of cards to lay down each round. Pt was left in room with call bell within reach and bed alarm set. SLP recommends to continue skilled services. ?   ? ?Pain ?Pain Assessment ?Pain Scale: 0-10 ?Pain Score: 0-No pain ? ?Therapy/Group: Individual Therapy ? ?Lonnel Gjerde ?12/09/2021, 1:52 PM ?

## 2021-12-09 NOTE — Progress Notes (Signed)
?                                                       PROGRESS NOTE ? ? ?Subjective/Complaints: ? ?No new complaints today. Husband at bedside. Anticipating dc Wednesday.  ? ?ROS: Patient denies fever, rash, sore throat, blurred vision, dizziness, nausea, vomiting, diarrhea, cough, shortness of breath or chest pain, joint or back/neck pain, headache, or mood change.  ? ? ? ?Objective: ?  ?No results found. ?No results for input(s): WBC, HGB, HCT, PLT in the last 72 hours. ? ? ?No results for input(s): NA, K, CL, CO2, GLUCOSE, BUN, CREATININE, CALCIUM in the last 72 hours. ? ? ? ?Intake/Output Summary (Last 24 hours) at 12/09/2021 1058 ?Last data filed at 12/08/2021 1933 ?Gross per 24 hour  ?Intake 510 ml  ?Output --  ?Net 510 ml  ?  ? ?  ? ?Physical Exam: ?Vital Signs ?Blood pressure (!) 121/57, pulse (!) 59, temperature 98 ?F (36.7 ?C), temperature source Oral, resp. rate 14, height '5\' 2"'$  (1.575 m), weight 63.9 kg, SpO2 98 %. ? ?Constitutional: No distress . Vital signs reviewed. ?HEENT: NCAT, EOMI, oral membranes moist ?Neck: supple ?Cardiovascular: RRR without murmur. No JVD    ?Respiratory/Chest: CTA Bilaterally without wheezes or rales. Normal effort    ?GI/Abdomen: BS +, non-tender, non-distended ?Ext: no clubbing, cyanosis, or edema ?Psych: pleasant and cooperative  ?Skin: No evidence of breakdown, no evidence of rash ?Neurologic: reasonable insight and awareness. No language issues.  Cranial nerves II through XII intact, motor strength is 5/5 in RIght and 4-/5 left deltoid, bicep, tricep, grip, hip flexor, knee extensors, ankle dorsiflexor and plantar flexor-no changes ?Sensory exam normal for light touch and pain in all 4 limbs. No limb ataxia or cerebellar signs. No abnormal tone appreciated.    ?Musculoskeletal: Full range of motion in all 4 extremities. No joint swelling ? -wearing LUE splint.  ? ? ?Assessment/Plan: ?1. Functional deficits which require 3+ hours per day of interdisciplinary therapy in a  comprehensive inpatient rehab setting. ?Physiatrist is providing close team supervision and 24 hour management of active medical problems listed below. ?Physiatrist and rehab team continue to assess barriers to discharge/monitor patient progress toward functional and medical goals ? ?Care Tool: ? ?Bathing ?   ?Body parts bathed by patient: Left arm, Chest, Abdomen, Right upper leg, Left upper leg, Right lower leg, Left lower leg, Face, Front perineal area  ? Body parts bathed by helper: Buttocks, Right arm ?  ?  ?Bathing assist Assist Level: Moderate Assistance - Patient 50 - 74% ?  ?  ?Upper Body Dressing/Undressing ?Upper body dressing   ?What is the patient wearing?: Pull over shirt ?   ?Upper body assist Assist Level: Supervision/Verbal cueing ?   ?Lower Body Dressing/Undressing ?Lower body dressing ? ? ?   ?What is the patient wearing?: Pants, Incontinence brief ? ?  ? ?Lower body assist Assist for lower body dressing: Moderate Assistance - Patient 50 - 74% ?   ? ?Toileting ?Toileting Toileting Activity did not occur (Probation officer and hygiene only): N/A (no void or bm)  ?Toileting assist Assist for toileting: Moderate Assistance - Patient 50 - 74% ?  ?  ?Transfers ?Chair/bed transfer ? ?Transfers assist ?   ? ?Chair/bed transfer assist level: Minimal Assistance - Patient > 75% ?  ?  ?  Locomotion ?Ambulation ? ? ?Ambulation assist ? ? Ambulation activity did not occur: Safety/medical concerns ? ?  ?  ?   ? ?Walk 10 feet activity ? ? ?Assist ? Walk 10 feet activity did not occur: Safety/medical concerns ? ?  ?   ? ?Walk 50 feet activity ? ? ?Assist Walk 50 feet with 2 turns activity did not occur: Safety/medical concerns ? ?  ?   ? ? ?Walk 150 feet activity ? ? ?Assist Walk 150 feet activity did not occur: Safety/medical concerns ? ?  ?  ?  ? ?Walk 10 feet on uneven surface  ?activity ? ? ?Assist Walk 10 feet on uneven surfaces activity did not occur: Safety/medical concerns ? ? ?  ?    ? ?Wheelchair ? ? ? ? ?Assist Is the patient using a wheelchair?: Yes ?Type of Wheelchair: Manual ?  ? ?Wheelchair assist level: Dependent - Patient 0% ?Max wheelchair distance: 150  ? ? ?Wheelchair 50 feet with 2 turns activity ? ? ? ?Assist ? ?  ?  ? ? ?Assist Level: Dependent - Patient 0%  ? ?Wheelchair 150 feet activity  ? ? ? ?Assist ?   ? ? ?Assist Level: Dependent - Patient 0%  ? ?Blood pressure (!) 121/57, pulse (!) 59, temperature 98 ?F (36.7 ?C), temperature source Oral, resp. rate 14, height '5\' 2"'$  (1.575 m), weight 63.9 kg, SpO2 98 %. ? ?Medical Problem List and Plan: ?1. 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            -patient may shower ?            -ELOS/Goals: 4/5  min assist to supervision goals , ? -Continue CIR therapies including PT, OT  ?2.  Impaired mobility: continue Lovenox ?            -antiplatelet therapy: Plavix and aspirin ?3. Pain Management: Tylenol ?4. Mood: LCSW to evaluate and provide emotional support ?            -antipsychotic agents: Not applicable ?5. Neuropsych: This patient is capable of making decisions on her own behalf. ?6. Skin/Wound Care: Routine skin care checks ?7. Fluids/Electrolytes/Nutrition: Routine I's and O's and follow-up chemistries ?Dysphagia , MBS performed at OSH ?            -- D2/nectar diet currently ?8: DM-2: carb modified diet ?--CBGs and SSI (no home meds) ?CBG (last 3)  ?Recent Labs  ?  12/08/21 ?1622 12/08/21 ?2052 12/09/21 ?9030  ?GLUCAP 125* 134* 119*  ?Controlled 4/3 ? ?9: Seasonal allergies: Continue symptomatic relief as needed ?10: GERD: Continue Protonix ?11: COPD: Continue albuterol nebs as needed (no home meds)- smoking hx  ?            --no supplemental O2 needs ?12: Hypokalemia: resolved with supplementation f/u BMET stable 3/28 ? ?  Latest Ref Rng & Units 12/03/2021  ?  7:50 AM 11/26/2021  ?  5:26 AM 11/20/2021  ?  5:29 AM  ?BMP   ?Glucose 70 - 99 mg/dL 119   98   111    ?BUN 8 - 23 mg/dL '17   17   20    '$ ?Creatinine 0.44 - 1.00 mg/dL 0.77   0.72   0.69    ?Sodium 135 - 145 mmol/L 135   135   133    ?Potassium 3.5 - 5.1 mmol/L 4.0   4.4  4.0    ?Chloride 98 - 111 mmol/L 101   102   99    ?CO2 22 - 32 mmol/L '28   25   24    '$ ?Calcium 8.9 - 10.3 mg/dL 10.1   9.5   9.6    ? ? ?13: Hyperlipidemia: intolerant of all statins (no other alternatives listed on home meds) ?14: Hypertension: Controlled off home amlodipine ?             ?Vitals:  ? 12/08/21 1943 12/09/21 0417  ?BP: 138/65 (!) 121/57  ?Pulse: 70 (!) 59  ?Resp: 18 14  ?Temp: 97.9 ?F (36.6 ?C) 98 ?F (36.7 ?C)  ?SpO2: 96% 98%  ?4/3 bp controlled ?15: Chronic right knee pain: will monitor how this affects therapy , no NSAIDs due to CVA , may use tylenol  ? 16.  Neurogenic bladder - spastic , due to CVA ,  ? -UCX + for E Coli, sensitive to keflex ?  -completed therapy  ?17. Left wrist flexor tendinitis- cont wrist splint --wears during the day ?18.  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 ? ?LOS: ?20 days ?A FACE TO FACE EVALUATION WAS PERFORMED ? ?Meredith Staggers ?12/09/2021, 10:58 AM  ? ? ? ?

## 2021-12-10 ENCOUNTER — Telehealth: Payer: Medicare Other

## 2021-12-10 DIAGNOSIS — I6381 Other cerebral infarction due to occlusion or stenosis of small artery: Secondary | ICD-10-CM | POA: Diagnosis not present

## 2021-12-10 LAB — BASIC METABOLIC PANEL
Anion gap: 9 (ref 5–15)
BUN: 17 mg/dL (ref 8–23)
CO2: 25 mmol/L (ref 22–32)
Calcium: 9.8 mg/dL (ref 8.9–10.3)
Chloride: 102 mmol/L (ref 98–111)
Creatinine, Ser: 0.72 mg/dL (ref 0.44–1.00)
GFR, Estimated: >60 mL/min (ref >60)
Glucose, Bld: 103 mg/dL — ABNORMAL HIGH (ref 70–99)
Potassium: 3.9 mmol/L (ref 3.5–5.1)
Sodium: 136 mmol/L (ref 135–145)

## 2021-12-10 LAB — CBC
HCT: 36 % (ref 36.0–46.0)
Hemoglobin: 11.9 g/dL — ABNORMAL LOW (ref 12.0–15.0)
MCH: 27.2 pg (ref 26.0–34.0)
MCHC: 33.1 g/dL (ref 30.0–36.0)
MCV: 82.4 fL (ref 80.0–100.0)
Platelets: 183 10*3/uL (ref 150–400)
RBC: 4.37 MIL/uL (ref 3.87–5.11)
RDW: 13.9 % (ref 11.5–15.5)
WBC: 6.6 10*3/uL (ref 4.0–10.5)
nRBC: 0 % (ref 0.0–0.2)

## 2021-12-10 LAB — GLUCOSE, CAPILLARY
Glucose-Capillary: 125 mg/dL — ABNORMAL HIGH (ref 70–99)
Glucose-Capillary: 117 mg/dL — ABNORMAL HIGH (ref 70–99)
Glucose-Capillary: 98 mg/dL (ref 70–99)
Glucose-Capillary: 120 mg/dL — ABNORMAL HIGH (ref 70–99)

## 2021-12-10 MED ORDER — FLUTICASONE PROPIONATE 50 MCG/ACT NA SUSP
1.0000 | Freq: Every day | NASAL | Status: DC
  Administered 2021-12-10: 1 via NASAL
  Filled 2021-12-10: qty 16

## 2021-12-10 NOTE — Progress Notes (Signed)
Speech Language Pathology Discharge Summary ? ?Patient Details  ?Name: Elizabeth [name] ?MRN: [id number] ?Date of Birth: 1941/05/03 ? ?Today's Date: 12/10/2021 ?SLP Individual Time: 1100-1200 ?SLP Individual Time Calculation (min): 60 min ? ?Skilled Therapeutic Interventions: Skilled ST treatment focused on cognitive-communication and swallowing goals. SLP facilitated session by providing sup A verbal cues for mildly complex reasoning, alternating attention, and working memory task. Pt required intermittent verbal redirection for error awareness and repair. SLP also reviewed how to thicken liquids to nectar thick consistency using simply thick packets in which patient was able to verbalize understanding through teach back with 100% accuracy. SLP assisted pt with calling [facility] services to order noon meal. Pt required sup A verbal cues for dialing number and independently ordered meal with intermittent need to verbally repeat herself to increase speech intelligibility to ~80% intelligibility at the sentence level. Pt requested to use bathroom at end of session and ambulated with RW with min A verbal cues for attention to L side. Pt voided bladder and returned to bed and passed off to NT.  ? ?Patient has met 4 of 7 long term goals.  Patient to discharge at overall Supervision;Min level.  ?Reasons goals not met: Slower than anticipated progress  ? ?Clinical Impression/Discharge Summary: Pt has demonstrated moderate progress towards swallowing, speech, and cognitive-linguistic goals meeting 4 out of 7 long-term goals this admission. Patient is currently completing functional and basic cognitive tasks with sup-to-min A cues in regards to functional recall, basic problem solving, intellectual awareness, and attention. Patient has progressed with speech intelligiblity strategies and is perceived as ~80% intelligible at the sentence level with implementation of strategies to enhance loudness, over-articulate, and pause  between words. Patient is currently consuming a dys 2 diet and nectar thick liquids and requires sup A for implementation of safe swallow precautions and strategies. Pt/family education completed. Care partner is independent to provide the necessary physical and cognitive assistance at discharge. Recommend supervision and assistance with completion of all iADL tasks to include medication and financial management, in addition to ongoing ST intervention at next level of care in home health setting  to maximize cognitive-communication, speech, and swallow function and functional independence.  ?  ?Care Partner:  ?Caregiver Able to Provide Assistance: Yes  ?Type of Caregiver Assistance: Physical;Cognitive ? ?Recommendation:  ?Home Health SLP;24 hour supervision/assistance  ?Rationale for SLP Follow Up: Maximize cognitive function and independence;Maximize functional communication;Maximize swallowing safety;Reduce caregiver burden  ? ?Equipment: Thickener  ? ?Reasons for discharge: Treatment goals met;Discharged from hospital  ? ?Patient/Family Agrees with Progress Made and Goals Achieved: Yes  ? ? ?Brigetta Beckstrom T Tranell Wojtkiewicz ?12/10/2021, 3:59 PM ? ?

## 2021-12-10 NOTE — Progress Notes (Signed)
Patient ID: Elizabeth [name], female   DOB: September 18, 1940, 81 y.o.   MRN: [id number] ? ?Patient [facility] referral sent to [facility] ?

## 2021-12-10 NOTE — Progress Notes (Signed)
Inpatient Rehabilitation Discharge Medication Review by a Pharmacist ? ?A complete drug regimen review was completed for this patient to identify any potential clinically significant medication issues. ? ?High Risk Drug Classes Is patient taking? Indication by Medication  ?Antipsychotic No   ?Anticoagulant No   ?Antibiotic No   ?Opioid No   ?Antiplatelet Yes Aspirin, plavix- PAD  ?Hypoglycemics/insulin Yes Empagliflozin- T2DM  ?Vasoactive Medication Yes Norvasc- hypertension  ?Chemotherapy No   ?Other No   ? ? ? ?Type of Medication Issue Identified Description of Issue Recommendation(s)  ?Drug Interaction(s) (clinically significant) ?    ?Duplicate Therapy ?    ?Allergy ?    ?No Medication Administration End Date ?    ?Incorrect Dose ?    ?Additional Drug Therapy Needed ?    ?Significant med changes from prior encounter (inform family/care partners about these prior to discharge).    ?Other ?    ? ? ?Clinically significant medication issues were identified that warrant physician communication and completion of prescribed/recommended actions by midnight of the next day:  No ? ?Time spent performing this drug regimen review (minutes):  30 ? ? ?Olivea Sonnen BS, PharmD, BCPS ?Clinical Pharmacist ?12/11/2021 11:16 AM ?

## 2021-12-10 NOTE — Plan of Care (Signed)
?Problem: RH Balance ?Goal: LTG: Patient will maintain dynamic sitting balance (OT) ?Description: LTG:  Patient will maintain dynamic sitting balance with assistance during activities of daily living (OT) ?Outcome: Not Met (add Reason) ?Note: Pt continues to be limited by L inattention, L HP , decreased safety awareness, and L lean to DC at overall min A for mobility, mod A for LBD/toileting. ?Goal: LTG Patient will maintain dynamic standing with ADLs (OT) ?Description: LTG:  Patient will maintain dynamic standing balance with assist during activities of daily living (OT)  ?Outcome: Not Met (add Reason) ?Note: Pt continues to be limited by L inattention, L HP , decreased safety awareness, and L lean to DC at overall min A for mobility, mod A for LBD/toileting. ?  ?Problem: RH Bathing ?Goal: LTG Patient will bathe all body parts with assist levels (OT) ?Description: LTG: Patient will bathe all body parts with assist levels (OT) ?Outcome: Not Met (add Reason) ?Note: Pt continues to be limited by L inattention, L HP , decreased safety awareness, and L lean to DC at overall min A for mobility, mod A for LBD/toileting. ?  ?Problem: RH Dressing ?Goal: LTG Patient will perform lower body dressing w/assist (OT) ?Description: LTG: Patient will perform lower body dressing with assist, with/without cues in positioning using equipment (OT) ?Outcome: Not Met (add Reason) ?Note: Pt continues to be limited by L inattention, L HP , decreased safety awareness, and L lean to DC at overall min A for mobility, mod A for LBD/toileting. ?  ?Problem: RH Toileting ?Goal: LTG Patient will perform toileting task (3/3 steps) with assistance level (OT) ?Description: LTG: Patient will perform toileting task (3/3 steps) with assistance level (OT)  ?Outcome: Not Met (add Reason) ?Note: Pt continues to be limited by L inattention, L HP , decreased safety awareness, and L lean to DC at overall min A for mobility, mod A for LBD/toileting. ?   ?Problem: RH Light Housekeeping ?Goal: LTG Patient will perform light housekeeping w/assist (OT) ?Description: LTG: Patient will perform light housekeeping with assistance, with/without cues (OT). ?Outcome: Not Met (add Reason) ?Note: Pt continues to be limited by L inattention, L HP , decreased safety awareness, and L lean to DC at overall min A for mobility, mod A for LBD/toileting. ?  ?Problem: RH Toilet Transfers ?Goal: LTG Patient will perform toilet transfers w/assist (OT) ?Description: LTG: Patient will perform toilet transfers with assist, with/without cues using equipment (OT) ?Outcome: Not Met (add Reason) ?Note: Pt continues to be limited by L inattention, L HP , decreased safety awareness, and L lean to DC at overall min A for mobility, mod A for LBD/toileting. ?  ?Problem: RH Tub/Shower Transfers ?Goal: LTG Patient will perform tub/shower transfers w/assist (OT) ?Description: LTG: Patient will perform tub/shower transfers with assist, with/without cues using equipment (OT) ?Outcome: Not Met (add Reason) ?Note: Pt continues to be limited by L inattention, L HP , decreased safety awareness, and L lean to DC at overall min A for mobility, mod A for LBD/toileting. ?  ?Problem: RH Awareness ?Goal: LTG: Patient will demonstrate awareness during functional activites type of (OT) ?Description: LTG: Patient will demonstrate awareness during functional activites type of (OT) ?Outcome: Not Met (add Reason) ?Note: Pt continues to require physical assist to correct L lean and cuing to identify. ?  ?Problem: Sit to Stand ?Goal: LTG:  Patient will perform sit to stand in prep for activites of daily living with assistance level (OT) ?Description: LTG:  Patient will perform sit to stand   in prep for activites of daily living with assistance level (OT) ?Outcome: Completed/Met ?  ?Problem: RH Grooming ?Goal: LTG Patient will perform grooming w/assist,cues/equip (OT) ?Description: LTG: Patient will perform grooming with  assist, with/without cues using equipment (OT) ?Outcome: Completed/Met ?  ?Problem: RH Dressing ?Goal: LTG Patient will perform upper body dressing (OT) ?Description: LTG Patient will perform upper body dressing with assist, with/without cues (OT). ?Outcome: Completed/Met ?  ?Problem: RH Functional Use of Upper Extremity ?Goal: LTG Patient will use RT/LT upper extremity as a (OT) ?Description: LTG: Patient will use right/left upper extremity as a stabilizer/gross assist/diminished/nondominant/dominant level with assist, with/without cues during functional activity (OT) ?Outcome: Completed/Met ?  ?Problem: RH Furniture Transfers ?Goal: LTG Patient will perform furniture transfers w/assist (OT/PT) ?Description: LTG: Patient will perform furniture transfers  with assistance (OT/PT). ?Outcome: Completed/Met ?  ?

## 2021-12-10 NOTE — Plan of Care (Signed)
?  Problem: RH Problem Solving ?Goal: LTG Patient will demonstrate problem solving for (SLP) ?Description: LTG:  Patient will demonstrate problem solving for basic/complex daily situations with cues  (SLP) ?Outcome: Not Met (add Reason) ?Note: Goal met at the basic level  ?  ?Problem: RH Memory ?Goal: LTG Patient will use memory compensatory aids to (SLP) ?Description: LTG:  Patient will use memory compensatory aids to recall biographical/new, daily complex information with cues (SLP) ?Outcome: Not Met (add Reason) ?Note: Pt requiring at least sup A verbal cues ?  ?Problem: RH Awareness ?Goal: LTG: Patient will demonstrate awareness during functional activites type of (SLP) ?Description: LTG: Patient will demonstrate awareness during functional activites type of (SLP) ?Outcome: Not Met (add Reason) ?Note: Pt with reduced emergent awareness requiring at least min A during functional situations ?  ?Problem: RH Expression Communication ?Goal: LTG Patient will increase speech intelligibility (SLP) ?Description: LTG: Patient will increase speech intelligibility at word/phrase/conversation level with cues, % of the time (SLP) ?Outcome: Completed/Met ?  ?Problem: RH Memory ?Goal: LTG Patient will demonstrate ability for day to day (SLP) ?Description: LTG:   Patient will demonstrate ability for day to day recall/carryover during cognitive/linguistic activities with assist  (SLP) ?Outcome: Completed/Met ?  ?Problem: RH Swallowing ?Goal: LTG Patient will consume least restrictive diet using compensatory strategies with assistance (SLP) ?Description: LTG:  Patient will consume least restrictive diet using compensatory strategies with assistance (SLP) ?12/10/2021 1555 by Charna Elizabeth T, CCC-SLP ?Outcome: Completed/Met ?12/10/2021 1306 by Charna Elizabeth T, CCC-SLP ?Flowsheets (Taken 11/20/2021 1242) ?LTG: Pt Patient will consume least restrictive diet using compensatory strategies with assistance of (SLP): Supervision ?Goal:  LTG Patient will participate in dysphagia therapy to increase swallow function with assistance (SLP) ?Description: LTG:  Patient will participate in dysphagia therapy to increase swallow function with assistance (SLP) ?12/10/2021 1555 by Charna Elizabeth T, CCC-SLP ?Outcome: Completed/Met ?12/10/2021 1306 by Charna Elizabeth T, CCC-SLP ?Flowsheets (Taken 12/10/2021 1306) ?LTG: Pt will participate in dysphagia therapy to increase swallow function with assistance of (SLP): Supervision ?  ?

## 2021-12-10 NOTE — Plan of Care (Signed)
?  Problem: RH Swallowing ?Goal: LTG Patient will consume least restrictive diet using compensatory strategies with assistance (SLP) ?Description: LTG:  Patient will consume least restrictive diet using compensatory strategies with assistance (SLP) ?Flowsheets (Taken 11/20/2021 1242) ?LTG: Pt Patient will consume least restrictive diet using compensatory strategies with assistance of (SLP): Supervision ?Goal: LTG Patient will participate in dysphagia therapy to increase swallow function with assistance (SLP) ?Description: LTG:  Patient will participate in dysphagia therapy to increase swallow function with assistance (SLP) ?Flowsheets (Taken 12/10/2021 1306) ?LTG: Pt will participate in dysphagia therapy to increase swallow function with assistance of (SLP): Supervision ?  ?

## 2021-12-10 NOTE — Discharge Instructions (Addendum)
Inpatient Rehab Discharge Instructions ? ?Elizabeth [name] ?Discharge date and time: 12/11/2021  ? ?Activities/Precautions/ Functional Status: ?Activity: no lifting, driving, or strenuous exercise for until cleared by MD ?Diet: cardiac diet, carb modified ?Wound Care: keep wound clean and dry ?Functional status:  ?___ No restrictions     ___ Walk up steps independently ?___ 24/7 supervision/assistance   ___ Walk up steps with assistance ?__x_ Intermittent supervision/assistance  ___ Bathe/dress independently ?___ Walk with walker     ___ Bathe/dress with assistance ?___ Walk Independently    ___ Shower independently ?___ Walk with assistance    __x_ Shower with assistance ?_x__ No alcohol     ___ Return to work/school ________ ? ?COMMUNITY REFERRALS UPON DISCHARGE:   ? ?Home Health:   PT     OT     ST                   Agency: [facility]  Phone: [phone]  ? ? ?Medical Equipment/Items Ordered: Hemi Height Wheelchair and [employer] ?                                                Agency/Supplier: [facility] [phone] ? ? ?Special Instructions: ? ?No driving, alcohol consumption or tobacco use.  ? ?STROKE/TIA DISCHARGE INSTRUCTIONS ?SMOKING Cigarette smoking nearly doubles your risk of having a stroke & is the single most alterable risk factor  ?If you smoke or have smoked in the last 12 months, you are advised to quit smoking for your health. Most of the excess cardiovascular risk related to smoking disappears within a year of stopping. ?Ask you doctor about anti-smoking medications ?[facility] Quit Line: 1-800-QUIT NOW ?Free Smoking Cessation Classes [phone]  ?CHOLESTEROL Know your levels; limit fat & cholesterol in your diet  ?Lipid Panel  ?   ?Component Value Date/Time  ? CHOL 232 (H) 08/26/2021 0845  ? CHOL 242 (H) 08/09/2013 4193  ? TRIG 101 08/26/2021 0845  ? TRIG 180 (H) 09/06/2015 0947  ? TRIG 92 08/09/2013 0828  ? HDL 58 08/26/2021 0845  ? HDL 43 09/06/2015 0947  ? HDL 53 08/09/2013 0828  ? CHOLHDL 4.0 08/26/2021  0845  ? CHOLHDL 7.0 01/21/2017 1015  ? VLDL 27 01/21/2017 1015  ? [place] 156 (H) 08/26/2021 0845  ? [place] 230 (H) 02/24/2014 7902  ? LDLCALC 171 (H) 08/09/2013 4097  ? ? ? Many patients benefit from treatment even if their cholesterol is at goal. ?Goal: Total Cholesterol (CHOL) less than 160 ?Goal:  Triglycerides (TRIG) less than 150 ?Goal:  HDL greater than 40 ?Goal:  LDL (LDLCALC) less than 100 ?  ?BLOOD PRESSURE American Stroke Association blood pressure target is less that 120/80 mm/Hg  ?Your discharge blood pressure is:  BP: (!) 147/79 Monitor your blood pressure ?Limit your salt and alcohol intake ?Many individuals will require more than one medication for high blood pressure  ?DIABETES (A1c is a blood sugar average for last 3 months) Goal HGBA1c is under 7% (HBGA1c is blood sugar average for last 3 months)  ?Diabetes: ?Diagnosis of diabetes:  Your A1c:5.8 %   ? ?Lab Results  ?Component Value Date  ? HGBA1C 5.8 (H) 08/26/2021  ? ? Your HGBA1c can be lowered with medications, healthy diet, and exercise. ?Check your blood sugar as directed by your physician ?Call your physician if you experience unexplained  or low blood sugars.  ?PHYSICAL ACTIVITY/REHABILITATION Goal is 30 minutes at least 4 days per week  ?Activity: Increase activity slowly, ?Therapies: Physical Therapy: Home Health and Occupational Therapy: Home Health ?Return to work: n/a Activity decreases your risk of heart attack and stroke and makes your heart stronger.  It helps control your weight and blood pressure; helps you relax and can improve your mood. ?Participate in a regular exercise program. ?Talk with your doctor about the best form of exercise for you (dancing, walking, swimming, cycling).  ?DIET/WEIGHT Goal is to maintain a healthy weight  ?Your discharge diet is:  ?Diet Order   ? ?       ?  DIET DYS 2 Room service appropriate? Yes; Fluid consistency: Nectar Thick  Diet effective now       ?  ? ?  ?  ? ?  ? thin liquids ?Your height is:   Height: '5\' 2"'$  (157.5 cm) ?Your current weight is: Weight: 63.9 kg ?Your Body Mass Index (BMI) is:  BMI (Calculated): 25.76 Following the type of diet specifically designed for you will help prevent another stroke. ?Your goal weight range is:   ?Your goal Body Mass Index (BMI) is 19-24. ?Healthy food habits can help reduce 3 risk factors for stroke:  High cholesterol, hypertension, and excess weight.  ?RESOURCES Stroke/Support Group:  Call [phone] ?  ?STROKE EDUCATION PROVIDED/REVIEWED AND GIVEN TO PATIENT Stroke warning signs and symptoms ?How to activate emergency medical system (call 911). ?Medications prescribed at discharge. ?Need for follow-up after discharge. ?Personal risk factors for stroke. ?Pneumonia vaccine given: No ?Flu vaccine given: No ?My questions have been answered, the writing is legible, and I understand these instructions.  I will adhere to these goals & educational materials that have been provided to me after my discharge from the hospital.  ? ?  ? ?My questions have been answered and I understand these instructions. I will adhere to these goals and the provided educational materials after my discharge from the hospital. ? ?Patient/Caregiver Signature _______________________________ Date __________ ? ?Clinician Signature _______________________________________ Date __________ ? ?Please bring this form and your medication list with you to all your follow-up doctor's appointments.   ?

## 2021-12-10 NOTE — Progress Notes (Signed)
?                                                       PROGRESS NOTE ? ? ?Subjective/Complaints: ? ?No new c/os discussed 30d supply of meds then needs PCP to fefill  ?Husband states she is ready to go home ? ?ROS: Patient without CP, SOB, N/V/D ? ? ? ?Objective: ?  ?No results found. ?Recent Labs  ?  12/10/21 ?7062  ?WBC 6.6  ?HGB 11.9*  ?HCT 36.0  ?PLT 183  ? ? ? ?Recent Labs  ?  12/10/21 ?3762  ?NA 136  ?K 3.9  ?CL 102  ?CO2 25  ?GLUCOSE 103*  ?BUN 17  ?CREATININE 0.72  ?CALCIUM 9.8  ? ? ? ? ?Intake/Output Summary (Last 24 hours) at 12/10/2021 8315 ?Last data filed at 12/09/2021 1800 ?Gross per 24 hour  ?Intake 477 ml  ?Output --  ?Net 477 ml  ? ?  ? ?  ? ?Physical Exam: ?Vital Signs ?Blood pressure (!) 147/79, pulse 66, temperature 97.9 ?F (36.6 ?C), temperature source Oral, resp. rate 18, height '5\' 2"'$  (1.575 m), weight 63.9 kg, SpO2 96 %. ? ? ?General: No acute distress ?Mood and affect are appropriate ?Heart: Regular rate and rhythm no rubs murmurs or extra sounds ?Lungs: Clear to auscultation, breathing unlabored, no rales or wheezes ?Abdomen: Positive bowel sounds, soft nontender to palpation, nondistended ? ? ?Skin: No evidence of breakdown, no evidence of rash ?Neurologic: reasonable insight and awareness. No language issues.  Cranial nerves II through XII intact, motor strength is 5/5 in RIght and 4-/5 left deltoid, bicep, tricep, grip, hip flexor, knee extensors, ankle dorsiflexor and plantar flexor-no changes ?Sensory exam normal for light touch and pain in all 4 limbs. No limb ataxia or cerebellar signs. No abnormal tone appreciated.    ?Musculoskeletal: Full range of motion in all 4 extremities. No joint swelling ? -wearing LUE splint.  ? ? ?Assessment/Plan: ?1. Functional deficits which require 3+ hours per day of interdisciplinary therapy in a comprehensive inpatient rehab setting. ?Physiatrist is providing close team supervision and 24 hour management of active medical problems listed  below. ?Physiatrist and rehab team continue to assess barriers to discharge/monitor patient progress toward functional and medical goals ? ?Care Tool: ? ?Bathing ?   ?Body parts bathed by patient: Left arm, Chest, Abdomen, Right upper leg, Left upper leg, Right lower leg, Left lower leg, Face, Front perineal area, Right arm  ? Body parts bathed by helper: Buttocks ?  ?  ?Bathing assist Assist Level: Minimal Assistance - Patient > 75% ?  ?  ?Upper Body Dressing/Undressing ?Upper body dressing   ?What is the patient wearing?: Pull over shirt ?   ?Upper body assist Assist Level: Supervision/Verbal cueing ?   ?Lower Body Dressing/Undressing ?Lower body dressing ? ? ?   ?What is the patient wearing?: Pants, Incontinence brief ? ?  ? ?Lower body assist Assist for lower body dressing: Moderate Assistance - Patient 50 - 74% ?   ? ?Toileting ?Toileting Toileting Activity did not occur (Probation officer and hygiene only): N/A (no void or bm)  ?Toileting assist Assist for toileting: Moderate Assistance - Patient 50 - 74% ?  ?  ?Transfers ?Chair/bed transfer ? ?Transfers assist ?   ? ?Chair/bed transfer assist level: Minimal Assistance - Patient > 75% ?  ?  ?  Locomotion ?Ambulation ? ? ?Ambulation assist ? ? Ambulation activity did not occur: Safety/medical concerns ? ?  ?  ?   ? ?Walk 10 feet activity ? ? ?Assist ? Walk 10 feet activity did not occur: Safety/medical concerns ? ?  ?   ? ?Walk 50 feet activity ? ? ?Assist Walk 50 feet with 2 turns activity did not occur: Safety/medical concerns ? ?  ?   ? ? ?Walk 150 feet activity ? ? ?Assist Walk 150 feet activity did not occur: Safety/medical concerns ? ?  ?  ?  ? ?Walk 10 feet on uneven surface  ?activity ? ? ?Assist Walk 10 feet on uneven surfaces activity did not occur: Safety/medical concerns ? ? ?  ?   ? ?Wheelchair ? ? ? ? ?Assist Is the patient using a wheelchair?: Yes ?Type of Wheelchair: Manual ?  ? ?Wheelchair assist level: Dependent - Patient 0% ?Max wheelchair  distance: 150  ? ? ?Wheelchair 50 feet with 2 turns activity ? ? ? ?Assist ? ?  ?  ? ? ?Assist Level: Dependent - Patient 0%  ? ?Wheelchair 150 feet activity  ? ? ? ?Assist ?   ? ? ?Assist Level: Dependent - Patient 0%  ? ?Blood pressure (!) 147/79, pulse 66, temperature 97.9 ?F (36.6 ?C), temperature source Oral, resp. rate 18, height '5\' 2"'$  (1.575 m), weight 63.9 kg, SpO2 96 %. ? ?Medical Problem List and Plan: ?1. 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            -patient may shower ?            -ELOS/Goals: 4/5  min assist to supervision goals , husband feels ready today, ? Equipment status and need for family training defer to PT/OT. SW ? -Continue CIR therapies including PT, OT  ?2.  Impaired mobility: continue Lovenox ?            -antiplatelet therapy: Plavix and aspirin ?3. Pain Management: Tylenol ?4. Mood: LCSW to evaluate and provide emotional support ?            -antipsychotic agents: Not applicable ?5. Neuropsych: This patient is capable of making decisions on her own behalf. ?6. Skin/Wound Care: Routine skin care checks ?7. Fluids/Electrolytes/Nutrition: Routine I's and O's and follow-up chemistries ?Dysphagia , MBS performed at OSH ?            -- D2/nectar diet currently ?8: DM-2: carb modified diet ?--CBGs and SSI (no home meds) ?CBG (last 3)  ?Recent Labs  ?  12/09/21 ?1635 12/09/21 ?2100 12/10/21 ?0629  ?GLUCAP 112* 155* 125*  ? ?Controlled 4/4 ? ?9: Seasonal allergies: Continue symptomatic relief as needed ?10: GERD: Continue Protonix ?11: COPD: Continue albuterol nebs as needed (no home meds)- smoking hx  ?            --no supplemental O2 needs ?12: Hypokalemia: resolved with supplementation f/u BMET stable 4/4 ? ?  Latest Ref Rng & Units 12/10/2021  ?  5:06 AM 12/03/2021  ?  7:50 AM 11/26/2021  ?  5:26 AM  ?BMP  ?Glucose 70 - 99 mg/dL 103   119   98    ?BUN 8 - 23 mg/dL '17   17   17     '$ ?Creatinine 0.44 - 1.00 mg/dL 0.72   0.77   0.72    ?Sodium 135 - 145 mmol/L 136  135   135    ?Potassium 3.5 - 5.1 mmol/L 3.9   4.0   4.4    ?Chloride 98 - 111 mmol/L 102   101   102    ?CO2 22 - 32 mmol/L '25   28   25    '$ ?Calcium 8.9 - 10.3 mg/dL 9.8   10.1   9.5    ? ? ?13: Hyperlipidemia: intolerant of all statins (no other alternatives listed on home meds) ?14: Hypertension: Controlled off home amlodipine ?             ?Vitals:  ? 12/09/21 2041 12/10/21 0502  ?BP: (!) 117/51 (!) 147/79  ?Pulse: 70 66  ?Resp: 18 18  ?Temp: 98.1 ?F (36.7 ?C) 97.9 ?F (36.6 ?C)  ?SpO2: 96% 96%  ?4/4 bp controlled ?15: Chronic right knee pain: will monitor how this affects therapy , no NSAIDs due to CVA , may use tylenol  ? 16.  Neurogenic bladder - spastic , due to CVA ,  ? -UCX + for E Coli, sensitive to keflex ?  -completed therapy  ?17. Left wrist flexor tendinitis- cont wrist splint --wears during the day ?18.  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 ? ?LOS: ?21 days ?A FACE TO FACE EVALUATION WAS PERFORMED ? ?Luanna Salk Kaitrin Seybold ?12/10/2021, 9:22 AM  ? ? ? ?

## 2021-12-10 NOTE — Progress Notes (Signed)
Occupational Therapy Discharge Summary ? ?Patient Details  ?Name: Elizabeth [name] ?MRN: [id number] ?Date of Birth: 12/11/1940 ? ?Today's Date: 12/10/2021 ?OT Individual Time: 2197-5883 ?OT Individual Time Calculation (min): 61 min  ? ? ?Patient has met 5 of 14 long term goals due to improved activity tolerance, improved balance, postural control, ability to compensate for deficits, functional use of  LEFT upper extremity, improved attention, improved awareness, and improved coordination.  Patient to discharge at overall  min to mod A  level.  Patient's care partner is independent to provide the necessary physical and cognitive assistance at discharge.  Pt to DC at the below ADL/bathroom transfer performance level. Assist levels represent pt's most consistent performance when alert/participatory. Pt continues to be primarily limited by L inattention, L HP, L lean, poor safety awareness and insight into deficits. Family education completed via phone call with husband with caregivers demonstrating good understanding of pt's current deficits and impact on ADL/mobility performance, verbalizes understanding for recommendation of CGA to min physical assist with RW for all mobility, assist for medication management/IADL, and recs for 24/7 S. Pt and caregivers will benefit from continued [facility] OT to facilitate improved caregiver education, functional mobility, and occupational performance.  ? ? ?Reasons goals not met: Pt continues to be limited by L inattention, L HP , decreased safety awareness, and L lean to DC at overall min A for mobility, mod A for LBD/toileting. ? ?Recommendation:  ?Patient will benefit from ongoing skilled OT services in home health setting to continue to advance functional skills in the area of BADL and Reduce care partner burden. ? ?Equipment: ?No equipment provided ? ?Reasons for discharge: discharge from hospital ? ?Patient/family agrees with progress made and goals achieved: Yes ? ?OT  Discharge ?Precautions/Restrictions  ?Precautions ?Precautions: Fall ?Precaution Comments: L hemiparesis ?Restrictions ?Weight Bearing Restrictions: No ? ?Pain ?Pain Assessment ?Pain Scale: 0-10 ?Pain Score: 0-No pain ?ADL ?ADL ?Eating: Supervision/safety ?Where Assessed-Eating: Chair, Wheelchair ?Grooming: Supervision/safety ?Where Assessed-Grooming: Sitting at sink ?Upper Body Bathing: Supervision/safety ?Where Assessed-Upper Body Bathing: Sitting at sink ?Lower Body Bathing: Minimal assistance ?Where Assessed-Lower Body Bathing: Standing at sink, Sitting at sink ?Upper Body Dressing: Supervision/safety ?Where Assessed-Upper Body Dressing: Sitting at sink ?Lower Body Dressing: Moderate assistance ?Where Assessed-Lower Body Dressing: Sitting at sink ?Toileting: Moderate assistance ?Where Assessed-Toileting: Bedside Commode, Toilet ?Toilet Transfer: Minimal assistance ?Toilet Transfer Method: Ambulating ?Science writer: Grab bars ?Tub/Shower Transfer: Not assessed ?Walk-In Shower Transfer: Minimal assistance ?Walk-In Shower Transfer Method: Ambulating ?Vision ?Baseline Vision/History: 1 Wears glasses (readers) ?Patient Visual Report: No change from baseline ?Vision Assessment?: Yes ?Eye Alignment: Within Functional Limits ?Alignment/Gaze Preference: Gaze right ?Tracking/Visual Pursuits: Requires cues, head turns, or add eye shifts to track;Decreased smoothness of horizontal tracking ?Saccades: Additional eye shifts occurred during testing;Decreased speed of saccadic movement ?Convergence: Impaired (comment) (did not converge) ?Visual Fields: Left visual field deficit (L inattention) ?Perception  ?Perception: Impaired ?Inattention/Neglect: Does not attend to left visual field;Does not attend to left side of body ?Praxis ?Praxis: Impaired ?Praxis Impairment Details: Motor planning ?Cognition ?Cognition ?Overall Cognitive Status: Impaired/Different from baseline ?Arousal/Alertness: Awake/alert ?Orientation  Level: Person;Place;Situation ?Person: Oriented ?Place: Oriented ?Situation: Oriented ?Memory: Impaired ?Memory Impairment: Decreased short term memory;Decreased recall of new information ?Attention: Selective ?Sustained Attention: Impaired ?Sustained Attention Impairment: Verbal basic;Functional basic ?Awareness Impairment: Emergent impairment (continues to require cuing to correct/ acknowledge L lean in standing/sitting, to attend to position of LUE) ?Problem Solving: Impaired ?Safety/Judgment: Impaired (mildy impulsive, poor self-correct/emergent awraeness of deficits) ?Brief Interview for Mental Status (BIMS) ?  Repetition of Three Words (First Attempt): 3 ?Temporal Orientation: Year: Correct ?Temporal Orientation: Month: Accurate within 5 days ?Temporal Orientation: Day: Correct ?Recall: "Sock": Yes, no cue required ?Recall: "Blue": Yes, no cue required ?Recall: "Bed": Yes, no cue required ?BIMS Summary Score: 15 ?Sensation ?Sensation ?Light Touch: Impaired Detail ?Central sensation comments: reports mild parasthesia in L forearm, but able to detect all stimuli in testing on L ?Hot/Cold: Appears Intact ?Proprioception: Impaired by gross assessment ?Stereognosis: Impaired by gross assessment ?Additional Comments: L HP, poor awareness of LUE positioning ?Coordination ?Gross Motor Movements are Fluid and Coordinated: No ?Fine Motor Movements are Fluid and Coordinated: No ?Coordination and Movement Description: L sided hemiplegia UE>LE ?Finger Nose Finger Test: unable to isolate L first digit to complete ?Motor  ?Motor ?Motor: Hemiplegia;Abnormal tone ?Motor - Discharge Observations: L sided hemiplegia UE>LE ?Mobility  ?Bed Mobility ?Bed Mobility: Supine to Sit ?Supine to Sit: Moderate Assistance - Patient 50-74% ?Transfers ?Sit to Stand: Contact Guard/Touching assist ?Stand to Sit: Contact Guard/Touching assist  ?Trunk/Postural Assessment  ?Cervical Assessment ?Cervical Assessment: Exceptions to [facility] (mild forward  head, gaze R) ?Thoracic Assessment ?Thoracic Assessment: Exceptions to [facility] (rounded shoulders) ?Lumbar Assessment ?Lumbar Assessment: Exceptions to [facility] (posterior pelvic tilt in sitting) ?Postural Control ?Postural Control: Deficits on evaluation (L lean)  ?Balance ?Balance ?Balance Assessed: Yes ?Static Sitting Balance ?Static Sitting - Balance Support: Feet supported ?Static Sitting - Level of Assistance: 5: Stand by assistance ?Dynamic Sitting Balance ?Dynamic Sitting - Balance Support: Feet supported;No upper extremity supported ?Dynamic Sitting - Level of Assistance: 4: Min assist;5: Stand by assistance ?Static Standing Balance ?Static Standing - Balance Support: During functional activity;Bilateral upper extremity supported ?Static Standing - Level of Assistance: 5: Stand by assistance;4: Min assist ?Dynamic Standing Balance ?Dynamic Standing - Balance Support: Bilateral upper extremity supported;During functional activity ?Dynamic Standing - Level of Assistance: 5: Stand by assistance;4: Min assist ?Extremity/Trunk Assessment ?RUE Assessment ?RUE Assessment: Within Functional Limits ?LUE Assessment ?LUE Assessment: Exceptions to [facility] ?LUE Body System: Neuro ?Brunstrum levels for arm and hand: Arm;Hand ?Brunstrum level for arm: Stage III Synergy is performed voluntarily ?Brunstrum level for hand: Stage IV Movements deviating from synergies ? ?Session Note: Pt received asleep in bed, awakens to touch, agreeable to therapy with encouragement and increased time to rouse. Session focus on self-care retraining, activity tolerance, fam ed, transfer retraining in prep for improved ADL/IADL/func mobility performance + decreased caregiver burden. No c/o pain. ? ?Called husband at bedside to review pt's current deficits and impact on ADL/mobility performance + application of L wrist brace/ RHS. Husband verbalized understanding of rec for 24/7 S, assist for IADL (medication management), and CGA to min A for all mobility  with RW. No additional questions at this time and oriented to stroke booklet in binder. ? ?Came to sitting EOB with mod A to lift trunk and to pivot BLE. Ambulatory toilet transfer with min A to place L hand on RW, min A going

## 2021-12-10 NOTE — Progress Notes (Signed)
Patient ID: Elizabeth [name], female   DOB: 02/23/1941, 81 y.o.   MRN: [id number] ? ?Hemi WC and RW ordered through [employer] ?

## 2021-12-10 NOTE — Progress Notes (Signed)
Physical Therapy Discharge Summary ? ?Patient Details  ?Name: Elizabeth [name] ?MRN: [id number] ?Date of Birth: 1940-09-26 ? ?Today's Date: 12/10/2021 ?PT Individual Time: 2423-5361 ?PT Individual Time Calculation (min): 72 min  ? ? ?Patient has met 6 of 10 long term goals due to improved activity tolerance, improved balance, improved postural control, ability to compensate for deficits, functional use of  left upper extremity and left lower extremity, improved attention, and improved awareness.  Patient to discharge at an ambulatory level Mod/ Min Assist.   Patient's care partner is independent to provide the necessary physical assistance at discharge. ? ?Reasons goals not met: Pt has hit a plateau in progress over past week. She can continue to progress with continued training at next level of care. Progress expected with continued focus on slowing of movements with decreasing impulsivity and increasing conscious practice. Pt is mainly MinA with ambulation, however can require intermittent ModA d/t balance and fatigue with increase lean to L and decreased step height/ length. Pt has not progressed in coordinating w/c mobility to effectively propel in a straight path and requires intermittent MinA with consistent cueing. Husband has shown ability to effectively provide up to Min/ ModA that pt will require for safety.  ? ?Recommendation:  ?Patient will benefit from ongoing skilled PT services in home health setting to continue to advance safe functional mobility, address ongoing impairments in strength, coordination, balance, activity tolerance, cognition, safety awareness, and to minimize fall risk. ? ?Equipment: ?RW, 16x16 w/c ? ?Reasons for discharge: most treatment goals met and discharge from hospital ? ?Patient/family agrees with progress made and goals achieved: Yes ? ?PT Discharge ?Precautions/Restrictions ?Precautions ?Precautions: Fall ?Precaution Comments: continued L hemiparesis, improved from eval,  impulsive ?Restrictions ?Weight Bearing Restrictions: No ?Vital Signs ?Therapy Vitals ?Temp: 97.9 ?F (36.6 ?C) ?Temp Source: Oral ?Pulse Rate: 66 ?Resp: 18 ?BP: (!) 147/79 ?Patient Position (if appropriate): Lying ?Oxygen Therapy ?SpO2: 96 % ?O2 Device: Room Air ?Pain ?Pain Assessment ?Pain Scale: 0-10 ?Pain Score: 0-No pain ?Pain Interference ?Pain Interference ?Pain Effect on Sleep: 1. Rarely or not at all ?Pain Interference with Therapy Activities: 1. Rarely or not at all ?Pain Interference with Day-to-Day Activities: 1. Rarely or not at all ?Vision/Perception  ?Vision - History ?Ability to See in Adequate Light: 0 Adequate ?Perception ?Perception: Impaired ?Inattention/Neglect: Does not attend to left visual field;Does not attend to left side of body ?Praxis ?Praxis: Impaired ?Praxis Impairment Details: Initiation;Motor planning  ?Cognition ?Overall Cognitive Status: Impaired/Different from baseline ?Arousal/Alertness: Awake/alert ?Orientation Level: Oriented X4 ?Sustained Attention: Impaired ?Selective Attention: Impaired ?Memory: Impaired ?Awareness: Impaired ?Problem Solving: Impaired ?Safety/Judgment: Impaired ?Sensation ?Sensation ?Light Touch: Impaired Detail ?Central sensation comments: reports mild parasthesia in L forearm, but able to detect all stimuli in testing on L ?Hot/Cold: Appears Intact ?Proprioception: Impaired by gross assessment ?Stereognosis: Impaired by gross assessment ?Additional Comments: L hemipareisis, poor awareness of LUE positioning ?Coordination ?Gross Motor Movements are Fluid and Coordinated: No ?Fine Motor Movements are Fluid and Coordinated: No ?Coordination and Movement Description: L sided hemipareisis UE>LE ?Finger Nose Finger Test: unable to isolate L first digit to complete ?Heel Shin Test: [facility] bilaterally with focus ?Motor  ?Motor ?Motor: Hemiplegia;Abnormal tone ?Motor - Discharge Observations: L sided hemiplegia UE>LE  ?Mobility ?Bed Mobility ?Bed Mobility: Sit to  Supine;Supine to Sit ?Supine to Sit: Contact Guard/Touching assist (can complete with time and cueing) ?Sit to Supine: Contact Guard/Touching assist (can complete with time and cueing) ?Transfers ?Transfers: Sit to Stand;Stand to [employer] Transfers ?Sit to Stand:  Contact Guard/Touching assist;Supervision/Verbal cueing ?Stand to Sit: Contact Guard/Touching assist;Supervision/Verbal cueing ?Stand Pivot Transfers: Contact Guard/Touching assist ?Stand Pivot Transfer Details: Verbal cues for precautions/safety;Tactile cues for posture;Verbal cues for safe use of DME/AE;Tactile cues for placement ?Transfer (Assistive device): Rolling walker ?Locomotion  ?Gait ?Ambulation: Yes ?Gait Assistance: Minimal Assistance - Patient > 75%;Moderate Assistance - Patient 50-74% ?Gait Distance (Feet): 150 Feet ?Assistive device: Rolling walker ?Gait Assistance Details: Tactile cues for posture;Tactile cues for placement;Verbal cues for technique;Visual cues/gestures for sequencing;Verbal cues for safe use of DME/AE;Verbal cues for gait pattern ?Gait Assistance Details: tc at L shoulder for posture, vc throughout for step through with LLE ?Gait ?Gait: Yes ?Gait Pattern: Impaired ?Gait Pattern: Poor foot clearance - left;Lateral trunk lean to left;Decreased step length - left ?Gait velocity: decreased ?Stairs / Additional Locomotion ?Stairs: Yes ?Stairs Assistance: Moderate Assistance - Patient 50 - 74% ?Stair Management Technique: One rail Left ?Number of Stairs: 8 ?Height of Stairs: 6 ?Wheelchair Mobility ?Wheelchair Mobility: Yes ?Wheelchair Assistance: Minimal assistance - Patient >75% ?Wheelchair Propulsion: Right upper extremity;Both lower extermities ?Wheelchair Parts Management: Needs assistance  ?Trunk/Postural Assessment  ?Cervical Assessment ?Cervical Assessment: Exceptions to [facility] (mild forward head, gaze R) ?Thoracic Assessment ?Thoracic Assessment: Exceptions to [facility] (rounded shoulders) ?Lumbar Assessment ?Lumbar  Assessment: Exceptions to [facility] (posterior pelvic tilt in sitting) ?Postural Control ?Postural Control: Deficits on evaluation (L lean, posterior posture in sitting)  ?Balance ?Balance ?Balance Assessed: Yes ?Standardized Balance Assessment ?Standardized Balance Assessment: FIST ?Function in Sitting Test = FIST ?Anterior Nudge: superior sternum: Upper extremity support ?Posterior Nudge: between scapular spines: Verbal cues/increased time ?Lateral Nudge: to dominant side at acromion: Upper extremity support ?Static Sitting: 30 seconds: Independent ?Sitting, Shake "No": left and right: Verbal cues/increased time ?Sitting, Eyes Closed: 30 seconds: Upper extremity support ?Sitting, Eyes Closed Assistance: Min Assist ?Sitting, Lift Foot: dominant side, lift foot 1 inch twice: Upper extremity support ?Sitting, Lift Foot Assistance: [place] ?Pick Up Object From Behind: object at midline, hands breadth posterior: Verbal cues/increased time ?Forward Reach: use dominant arm, must complete full motion: Verbal cues/increased time ?Lateral Reach: use dominant arm, clear opposite ischial tuberosity: Upper extremity support ?Pick Up Object From Floor: from between feet: Verbal cues/increased time ?Posterior Scooting: move backwards 2 inches: Verbal cues/increased time ?Anterior Scooting: move forward 2 inches: Verbal cues/increased time ?Lateral Scooting: move to dominent side 2 inches: Verbal cues/increased time ?FIST TOTAL SCORE: 38 ?Static Sitting Balance ?Static Sitting - Balance Support: Feet supported ?Static Sitting - Level of Assistance: 5: Stand by assistance ?Dynamic Sitting Balance ?Dynamic Sitting - Balance Support: Feet supported;No upper extremity supported ?Dynamic Sitting - Level of Assistance: 4: Min assist;5: Stand by assistance (CGA) ?Static Standing Balance ?Static Standing - Balance Support: During functional activity;Bilateral upper extremity supported ?Static Standing - Level of Assistance: 5: Stand by  assistance ?Dynamic Standing Balance ?Dynamic Standing - Balance Support: Bilateral upper extremity supported;During functional activity ?Dynamic Standing - Level of Assistance: 4: Min assist;5: Stand by assistance (CGA/ MinA) ?

## 2021-12-10 NOTE — Progress Notes (Signed)
Inpatient Rehabilitation Care Coordinator ?Discharge Note  ? ?Patient Details  ?Name: Elizabeth [name] ?MRN: [id number] ?Date of Birth: [birth year] ? ? ?Discharge location: Home ? ?Length of Stay: 22 Days ? ?Discharge activity level: Supervision ? ?Home/community participation: Spouse ? ?Patient response MM:NOTRRN Literacy - How often do you need to have someone help you when you read instructions, pamphlets, or other written material from your doctor or pharmacy?: Often ? ?Patient response HA:FBXUXY Isolation - How often do you feel lonely or isolated from those around you?: Never ? ?Services provided included: SW, Neuropsych, Pharmacy, TR, CM, RN, SLP, OT, PT, RD, MD ? ?Financial Services:  ?Charity fundraiser Utilized: Private Insurance ?[employer] Medicare ? ?Choices offered to/list presented to: Patient and spouse ? ?Follow-up services arranged:  ?Home Health ?Home Health Agency: [facility]  ?  ?  ?  ? ?Patient response to transportation need: ?Is the patient able to respond to transportation needs?: Yes ?In the past 12 months, has lack of transportation kept you from medical appointments or from getting medications?: No ?In the past 12 months, has lack of transportation kept you from meetings, work, or from getting things needed for daily living?: No ? ? ? ?Comments (or additional information): ? ?Patient/Family verbalized understanding of follow-up arrangements:  Yes ? ?Individual responsible for coordination of the follow-up plan: [name] [phone] ? ?Confirmed correct DME delivered: Dyanne Iha 12/10/2021   ? ?Dyanne Iha ?

## 2021-12-11 DIAGNOSIS — I6381 Other cerebral infarction due to occlusion or stenosis of small artery: Secondary | ICD-10-CM | POA: Diagnosis not present

## 2021-12-11 LAB — GLUCOSE, CAPILLARY: Glucose-Capillary: 113 mg/dL — ABNORMAL HIGH (ref 70–99)

## 2021-12-11 MED ORDER — METHOCARBAMOL 500 MG PO TABS: 500.0000 mg | ORAL_TABLET | Freq: Four times a day (QID) | ORAL | 0 refills | Status: DC | PRN

## 2021-12-11 MED ORDER — ACETAMINOPHEN 325 MG PO TABS: 325.0000 mg | ORAL_TABLET | ORAL | Status: DC | PRN

## 2021-12-11 NOTE — Progress Notes (Addendum)
?                                                       PROGRESS NOTE ? ? ?Subjective/Complaints: ? ?Some R side pain yesterday, no breathing issues, no new bowel or bladder issues, denies injection in that area ? ?ROS: Patient without CP, SOB, N/V/D ? ? ? ?Objective: ?  ?No results found. ?Recent Labs  ?  12/10/21 ?8546  ?WBC 6.6  ?HGB 11.9*  ?HCT 36.0  ?PLT 183  ? ? ? ? ?Recent Labs  ?  12/10/21 ?2703  ?NA 136  ?K 3.9  ?CL 102  ?CO2 25  ?GLUCOSE 103*  ?BUN 17  ?CREATININE 0.72  ?CALCIUM 9.8  ? ? ? ? ? ?Intake/Output Summary (Last 24 hours) at 12/11/2021 0911 ?Last data filed at 12/10/2021 1851 ?Gross per 24 hour  ?Intake 480 ml  ?Output --  ?Net 480 ml  ? ?  ? ?  ? ?Physical Exam: ?Vital Signs ?Blood pressure (!) 149/71, pulse 65, temperature (!) 97.5 ?F (36.4 ?C), resp. rate 18, height '5\' 2"'$  (1.575 m), weight 68 kg, SpO2 99 %. ? ? ?General: No acute distress ?Mood and affect are appropriate ?Heart: Regular rate and rhythm no rubs murmurs or extra sounds ?Lungs: Clear to auscultation, breathing unlabored, no rales or wheezes ?Abdomen: Positive bowel sounds, soft nontender to palpation, nondistended ? ? ?Skin: No evidence of breakdown, no evidence of rash ?Neurologic: reasonable insight and awareness. No language issues.  Cranial nerves II through XII intact, motor strength is 5/5 in RIght and 4-/5 left deltoid, bicep, tricep, grip, hip flexor, knee extensors, ankle dorsiflexor and plantar flexor-no changes ?Sensory exam normal for light touch and pain in all 4 limbs. No limb ataxia or cerebellar signs. No abnormal tone appreciated.    ?Musculoskeletal: Full range of motion in all 4 extremities. No joint swelling ? -wearing LUE splint.  ?RIght flank and lateral abd area non tender to palpation ? ?Assessment/Plan: ?1. Functional deficits which require 3+ hours per day of interdisciplinary therapy in a comprehensive inpatient rehab setting. ?Physiatrist is providing close team supervision and 24 hour management of  active medical problems listed below. ?Physiatrist and rehab team continue to assess barriers to discharge/monitor patient progress toward functional and medical goals ? ?Care Tool: ? ?Bathing ?   ?Body parts bathed by patient: Left arm, Chest, Abdomen, Right upper leg, Left upper leg, Right lower leg, Left lower leg, Face, Front perineal area, Right arm  ? Body parts bathed by helper: Buttocks ?  ?  ?Bathing assist Assist Level: Minimal Assistance - Patient > 75% ?  ?  ?Upper Body Dressing/Undressing ?Upper body dressing   ?What is the patient wearing?: Pull over shirt ?   ?Upper body assist Assist Level: Supervision/Verbal cueing ?   ?Lower Body Dressing/Undressing ?Lower body dressing ? ? ?   ?What is the patient wearing?: Pants, Incontinence brief ? ?  ? ?Lower body assist Assist for lower body dressing: Moderate Assistance - Patient 50 - 74% ?   ? ?Toileting ?Toileting Toileting Activity did not occur (Probation officer and hygiene only): N/A (no void or bm)  ?Toileting assist Assist for toileting: Moderate Assistance - Patient 50 - 74% ?  ?  ?Transfers ?Chair/bed transfer ? ?Transfers assist ?   ? ?Chair/bed transfer assist level:  Contact Guard/Touching assist ?  ?  ?Locomotion ?Ambulation ? ? ?Ambulation assist ? ? Ambulation activity did not occur: Safety/medical concerns ? ?Assist level: Moderate Assistance - Patient 50 - 74% ?Assistive device: Walker-rolling ?Max distance: 156f  ? ?Walk 10 feet activity ? ? ?Assist ? Walk 10 feet activity did not occur: Safety/medical concerns ? ?Assist level: Contact Guard/Touching assist ?Assistive device: Walker-rolling  ? ?Walk 50 feet activity ? ? ?Assist Walk 50 feet with 2 turns activity did not occur: Safety/medical concerns ? ?Assist level: Moderate Assistance - Patient - 50 - 74% ?Assistive device: Walker-rolling  ? ? ?Walk 150 feet activity ? ? ?Assist Walk 150 feet activity did not occur: Safety/medical concerns ? ?Assist level: Moderate Assistance - Patient  - 50 - 74% ?Assistive device: Walker-rolling ?  ? ?Walk 10 feet on uneven surface  ?activity ? ? ?Assist Walk 10 feet on uneven surfaces activity did not occur: Safety/medical concerns ? ? ?Assist level: Moderate Assistance - Patient - 50 - 74% ?Assistive device: Walker-rolling  ? ?Wheelchair ? ? ? ? ?Assist Is the patient using a wheelchair?: Yes ?Type of Wheelchair: Manual ?  ? ?Wheelchair assist level: Minimal Assistance - Patient > 75% ?Max wheelchair distance: 100  ? ? ?Wheelchair 50 feet with 2 turns activity ? ? ? ?Assist ? ?  ?  ? ? ?Assist Level: Minimal Assistance - Patient > 75%  ? ?Wheelchair 150 feet activity  ? ? ? ?Assist ?   ? ? ?Assist Level: Maximal Assistance - Patient 25 - 49%  ? ?Blood pressure (!) 149/71, pulse 65, temperature (!) 97.5 ?F (36.4 ?C), resp. rate 18, height '5\' 2"'$  (1.575 m), weight 68 kg, SpO2 99 %. ? ?Medical Problem List and Plan: ?1. 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            -patient may shower ?            -ELOS/Goals: D/C home today  ?2.  Impaired mobility: continue Lovenox ?            -antiplatelet therapy: Plavix and aspirin ?3. Pain Management: Tylenol ?4. Mood: LCSW to evaluate and provide emotional support ?            -antipsychotic agents: Not applicable ?5. Neuropsych: This patient is capable of making decisions on her own behalf. ?6. Skin/Wound Care: Routine skin care checks ?7. Fluids/Electrolytes/Nutrition: Routine I's and O's and follow-up chemistries ?Dysphagia , MBS performed at OSH ?            -- D2/nectar diet currently ?8: DM-2: carb modified diet ?--CBGs and SSI (no home meds) ?CBG (last 3)  ?Recent Labs  ?  12/10/21 ?1641 12/10/21 ?2103 12/11/21 ?0616  ?GLUCAP 98 120* 113*  ? ?Controlled 4/5 ? ?9: Seasonal allergies: Continue symptomatic relief as needed ?10: GERD: Continue Protonix ?11: COPD: Continue albuterol nebs as needed (no home meds)-  smoking hx  ?            --no supplemental O2 needs ?12: Hypokalemia: resolved with supplementation f/u BMET stable 4/4 ? ?  Latest Ref Rng & Units 12/10/2021  ?  5:06 AM 12/03/2021  ?  7:50 AM 11/26/2021  ?  5:26 AM  ?BMP  ?Glucose 70 - 99 mg/dL 103   119   98    ?BUN 8 - 23 mg/dL '17   17   17    '$ ?  Creatinine 0.44 - 1.00 mg/dL 0.72   0.77   0.72    ?Sodium 135 - 145 mmol/L 136   135   135    ?Potassium 3.5 - 5.1 mmol/L 3.9   4.0   4.4    ?Chloride 98 - 111 mmol/L 102   101   102    ?CO2 22 - 32 mmol/L '25   28   25    '$ ?Calcium 8.9 - 10.3 mg/dL 9.8   10.1   9.5    ? ? ?13: Hyperlipidemia: intolerant of all statins (no other alternatives listed on home meds) ?14: Hypertension: Controlled off home amlodipine ?             ?Vitals:  ? 12/10/21 2019 12/11/21 0437  ?BP: 133/61 (!) 149/71  ?Pulse: 67 65  ?Resp: 18 18  ?Temp: 98.6 ?F (37 ?C) (!) 97.5 ?F (36.4 ?C)  ?SpO2: 97% 99%  ?4/5 bp controlled ?15: Chronic right knee pain: will monitor how this affects therapy , no NSAIDs due to CVA , may use tylenol  ? 16.  Neurogenic bladder - spastic , due to CVA ,  ? -UCX + for E Coli, sensitive to keflex ?  -completed therapy  ?17. Left wrist flexor tendinitis- cont wrist splint --wears during the day ?18.  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 ? ?LOS: ?22 days ?A FACE TO FACE EVALUATION WAS PERFORMED ? ?Luanna Salk Edwing Figley ?12/11/2021, 9:11 AM  ? ? ? ?

## 2021-12-11 NOTE — Plan of Care (Signed)
?Problem: RH Ambulation ?Goal: LTG Patient will ambulate in controlled environment (PT) ?Description: LTG: Patient will ambulate in a controlled environment, # of feet with assistance (PT). ?Outcome: Adequate for Discharge ?Flowsheets ?Taken 12/11/2021 0302 by Alger Simons, PT ?LTG: Pt will ambulate in controlled environ  assist needed:: Minimal Assistance - Patient > 75% ?Taken 11/20/2021 1159 by [name], PT ?LTG: Ambulation distance in controlled environment: 141f with LRAD ?Note: Pt requires consistent cueing to maintain CGA throughout ambulation. ?Goal: LTG Patient will ambulate in home environment (PT) ?Description: LTG: Patient will ambulate in home environment, # of feet with assistance (PT). ?Outcome: Adequate for Discharge ?Flowsheets ?Taken 12/11/2021 0302 by KAlger Simons PT ?LTG: Pt will ambulate in home environ  assist needed:: Minimal Assistance - Patient > 75% ?Taken 11/20/2021 1159 by TLorie Phenix PT ?LTG: Ambulation distance in home environment: 515fwith LRAD ?Note: Pt requires consistent cueing to maintain CGA throughout ambulation. ?  ?Problem: RH Wheelchair Mobility ?Goal: LTG Patient will propel w/c in controlled environment (PT) ?Description: LTG: Patient will propel wheelchair in controlled environment, # of feet with assist (PT) ?Outcome: Adequate for Discharge ?Flowsheets (Taken 12/11/2021 0302) ?LTG: Pt will propel w/c in controlled environ  assist needed:: Minimal Assistance - Patient > 75% ?LTG: Propel w/c distance in controlled environment: 100 ft ?Note: Pt will require continued training with next level of care to coordinate UE and LE's for effective propel without assist ?  ?Problem: RH Stairs ?Goal: LTG Patient will ambulate up and down stairs w/assist (PT) ?Description: LTG: Patient will ambulate up and down # of stairs with assistance (PT) ?Outcome: Adequate for Discharge ?Flowsheets ?Taken 12/11/2021 0302 by KrAlger SimonsPT ?LTG: Pt will ambulate up/down stairs assist  needed:: Moderate Assistance - Patient 50 - 74% ?Taken 11/20/2021 1159 by TuLorie PhenixPT ?LTG: Pt will  ambulate up and down number of stairs: 3 steps with 1 rail. ?Note: Can complete 4 steps with LHR but requires up to Mod A to maintain balance, sequence correctly with leading LE. Husband can provide adequate assist. ?  ?Problem: RH Balance ?Goal: LTG Patient will maintain dynamic sitting balance (PT) ?Description: LTG:  Patient will maintain dynamic sitting balance with assistance during mobility activities (PT) ?Outcome: Completed/Met ?Flowsheets (Taken 12/11/2021 0302) ?LTG: Pt will maintain dynamic sitting balance during mobility activities with:: Supervision/Verbal cueing ?Goal: LTG Patient will maintain dynamic standing balance (PT) ?Description: LTG:  Patient will maintain dynamic standing balance with assistance during mobility activities (PT) ?Outcome: Completed/Met ?Flowsheets (Taken 11/20/2021 1159 by TuLorie PhenixPT) ?LTG: Pt will maintain dynamic standing balance during mobility activities with:: Contact Guard/Touching assist ?  ?Problem: Sit to Stand ?Goal: LTG:  Patient will perform sit to stand with assistance level (PT) ?Description: LTG:  Patient will perform sit to stand with assistance level (PT) ?Outcome: Completed/Met ?Flowsheets (Taken 11/20/2021 1159 by TuLorie PhenixPT) ?LTG: PT will perform sit to stand in preparation for functional mobility with assistance level: Contact Guard/Touching assist ?  ?Problem: RH Bed Mobility ?Goal: LTG Patient will perform bed mobility with assist (PT) ?Description: LTG: Patient will perform bed mobility with assistance, with/without cues (PT). ?Outcome: Completed/Met ?Flowsheets (Taken 11/20/2021 1159 by TuLorie PhenixPT) ?LTG: Pt will perform bed mobility with assistance level of: Contact Guard/Touching assist ?  ?Problem: RH Bed to Chair Transfers ?Goal: LTG Patient will perform bed/chair transfers w/assist (PT) ?Description: LTG: Patient will  perform bed to chair transfers with assistance (PT). ?Outcome: Completed/Met ?Flowsheets (  Taken 11/20/2021 1159 by [name], PT) ?LTG: Pt will perform Bed to Chair Transfers with assistance level: Contact Guard/Touching assist ?  ?Problem: RH Car Transfers ?Goal: LTG Patient will perform car transfers with assist (PT) ?Description: LTG: Patient will perform car transfers with assistance (PT). ?Outcome: Completed/Met ?Flowsheets (Taken 11/20/2021 1159 by [name], PT) ?LTG: Pt will perform car transfers with assist:: Contact Guard/Touching assist ?  ?

## 2021-12-16 ENCOUNTER — Encounter (HOSPITAL_COMMUNITY): Payer: Self-pay | Admitting: Hematology

## 2021-12-19 ENCOUNTER — Ambulatory Visit (INDEPENDENT_AMBULATORY_CARE_PROVIDER_SITE_OTHER): Payer: Medicare Other | Admitting: Family Medicine

## 2021-12-19 ENCOUNTER — Encounter: Payer: Self-pay | Admitting: Family Medicine

## 2021-12-19 VITALS — BP 130/80 | Temp 98.3°F

## 2021-12-19 DIAGNOSIS — J309 Allergic rhinitis, unspecified: Secondary | ICD-10-CM | POA: Diagnosis not present

## 2021-12-19 MED ORDER — MONTELUKAST SODIUM 10 MG PO TABS: 10.0000 mg | ORAL_TABLET | Freq: Every day | ORAL | 3 refills | Status: DC

## 2021-12-19 MED ORDER — AZELASTINE HCL 0.1 % NA SOLN: 2.0000 | Freq: Two times a day (BID) | NASAL | 12 refills | Status: DC

## 2021-12-19 NOTE — Progress Notes (Signed)
Virtual Visit via Telephone Note ? ?I connected with Elizabeth [name] on 12/19/21 at  2:20 PM EDT by telephone and verified that I am speaking with the correct person using two identifiers. ? ?Location: ?Patient: home ?Provider: office ?  ?I discussed the limitations, risks, security and privacy concerns of performing an evaluation and management service by telephone and the availability of in person appointments. I also discussed with the patient that there may be a patient responsible charge related to this service. The patient expressed understanding and agreed to proceed. ? ? ?History of Present Illness: ? ? head cioongestion x 1 day, no ear pain, can't sneeze, no drainage , no fever or chills, no ear pain or sore throat or productive cough ?Observations/Objective: ?BP 130/80   Temp 98.3 ?F (36.8 ?C) (Oral)  ?Good communication with no confusion and intact memory. ?Alert and oriented x 3 ?No signs of respiratory distress during speech ? ? ?Assessment and Plan: ? ?Allergic sinusitis ?Currently uncontrolled, start daily astelin and singulair, saline nasal flushes once daily also encouraged ? ?Follow Up Instructions: ? ?  ?I discussed the assessment and treatment plan with the patient. The patient was provided an opportunity to ask questions and all were answered. The patient agreed with the plan and demonstrated an understanding of the instructions. ?  ?The patient was advised to call back or seek an in-person evaluation if the symptoms worsen or if the condition fails to improve as anticipated. ? ?I provided 8 minutes of non-face-to-face time during this encounter. ? ? ?Tula Nakayama, MD ? ?

## 2021-12-19 NOTE — Patient Instructions (Addendum)
F/u as before , call if yo need to be seen sooner ? ?Astelin spray and montelukast  tablets are prescribed for uncontrolled allergy symptoms ? ?Thanks for choosing [facility], we consider it a privelige to serve you. ? ?

## 2021-12-20 ENCOUNTER — Telehealth: Payer: Self-pay

## 2021-12-20 NOTE — Telephone Encounter (Signed)
noted 

## 2021-12-20 NOTE — Telephone Encounter (Signed)
Patient called to let Dr Moshe Cipro know the medicine she sent in to her pharmacy work and feeling much better. ?

## 2021-12-23 ENCOUNTER — Encounter: Payer: Self-pay | Admitting: Family Medicine

## 2021-12-23 DIAGNOSIS — E119 Type 2 diabetes mellitus without complications: Secondary | ICD-10-CM | POA: Diagnosis not present

## 2021-12-23 DIAGNOSIS — R1310 Dysphagia, unspecified: Secondary | ICD-10-CM | POA: Diagnosis not present

## 2021-12-23 DIAGNOSIS — Z7902 Long term (current) use of antithrombotics/antiplatelets: Secondary | ICD-10-CM | POA: Diagnosis not present

## 2021-12-23 DIAGNOSIS — I10 Essential (primary) hypertension: Secondary | ICD-10-CM | POA: Diagnosis not present

## 2021-12-23 DIAGNOSIS — I69354 Hemiplegia and hemiparesis following cerebral infarction affecting left non-dominant side: Secondary | ICD-10-CM | POA: Diagnosis not present

## 2021-12-23 DIAGNOSIS — Z9181 History of falling: Secondary | ICD-10-CM | POA: Diagnosis not present

## 2021-12-23 DIAGNOSIS — K219 Gastro-esophageal reflux disease without esophagitis: Secondary | ICD-10-CM | POA: Diagnosis not present

## 2021-12-23 DIAGNOSIS — E785 Hyperlipidemia, unspecified: Secondary | ICD-10-CM | POA: Diagnosis not present

## 2021-12-23 DIAGNOSIS — J449 Chronic obstructive pulmonary disease, unspecified: Secondary | ICD-10-CM | POA: Diagnosis not present

## 2021-12-23 DIAGNOSIS — I69322 Dysarthria following cerebral infarction: Secondary | ICD-10-CM | POA: Diagnosis not present

## 2021-12-23 DIAGNOSIS — J309 Allergic rhinitis, unspecified: Secondary | ICD-10-CM | POA: Insufficient documentation

## 2021-12-23 NOTE — Assessment & Plan Note (Signed)
Currently uncontrolled, start daily astelin and singulair, saline nasal flushes once daily also encouraged ?

## 2021-12-25 ENCOUNTER — Telehealth: Payer: Self-pay

## 2021-12-25 DIAGNOSIS — Z9181 History of falling: Secondary | ICD-10-CM | POA: Diagnosis not present

## 2021-12-25 DIAGNOSIS — I10 Essential (primary) hypertension: Secondary | ICD-10-CM | POA: Diagnosis not present

## 2021-12-25 DIAGNOSIS — J449 Chronic obstructive pulmonary disease, unspecified: Secondary | ICD-10-CM | POA: Diagnosis not present

## 2021-12-25 DIAGNOSIS — E119 Type 2 diabetes mellitus without complications: Secondary | ICD-10-CM | POA: Diagnosis not present

## 2021-12-25 DIAGNOSIS — K219 Gastro-esophageal reflux disease without esophagitis: Secondary | ICD-10-CM | POA: Diagnosis not present

## 2021-12-25 DIAGNOSIS — I69354 Hemiplegia and hemiparesis following cerebral infarction affecting left non-dominant side: Secondary | ICD-10-CM | POA: Diagnosis not present

## 2021-12-25 DIAGNOSIS — E785 Hyperlipidemia, unspecified: Secondary | ICD-10-CM | POA: Diagnosis not present

## 2021-12-25 DIAGNOSIS — Z7902 Long term (current) use of antithrombotics/antiplatelets: Secondary | ICD-10-CM | POA: Diagnosis not present

## 2021-12-25 DIAGNOSIS — R1310 Dysphagia, unspecified: Secondary | ICD-10-CM | POA: Diagnosis not present

## 2021-12-25 DIAGNOSIS — I69322 Dysarthria following cerebral infarction: Secondary | ICD-10-CM | POA: Diagnosis not present

## 2021-12-25 NOTE — Telephone Encounter (Signed)
Juliann Pulse, ST from [facility] called requesting verbal orders for 1wk5 starting 01/06/22. Also patient fell twice on Monday. Orders approved and given. ?

## 2021-12-26 ENCOUNTER — Encounter (HOSPITAL_COMMUNITY): Payer: Self-pay | Admitting: Hematology

## 2021-12-26 ENCOUNTER — Encounter (INDEPENDENT_AMBULATORY_CARE_PROVIDER_SITE_OTHER): Payer: Self-pay | Admitting: Gastroenterology

## 2021-12-26 ENCOUNTER — Ambulatory Visit (INDEPENDENT_AMBULATORY_CARE_PROVIDER_SITE_OTHER): Payer: Medicare Other | Admitting: Gastroenterology

## 2021-12-26 VITALS — BP 135/56 | HR 82 | Ht 62.0 in | Wt 159.0 lb

## 2021-12-26 DIAGNOSIS — K743 Primary biliary cirrhosis: Secondary | ICD-10-CM | POA: Diagnosis not present

## 2021-12-26 DIAGNOSIS — K7400 Hepatic fibrosis, unspecified: Secondary | ICD-10-CM

## 2021-12-26 DIAGNOSIS — R932 Abnormal findings on diagnostic imaging of liver and biliary tract: Secondary | ICD-10-CM | POA: Diagnosis not present

## 2021-12-26 MED ORDER — URSODIOL 300 MG PO CAPS: 300.0000 mg | ORAL_CAPSULE | Freq: Three times a day (TID) | ORAL | 3 refills | Status: DC

## 2021-12-26 NOTE — Progress Notes (Signed)
Elizabeth [name], M.D. ?Gastroenterology & Hepatology ?[facility] ?[address] ?[place], [place] [address] ? ?Primary Care Physician: ?Renee Rival, FNP ?[address] ?[place] [address] ? ?I will communicate my assessment and recommendations to the referring MD via EMR. ? 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 ?History of Present Illness: ?Elizabeth [name] is a 81 y.o. 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 ? ?The patient was last seen on 06/25/2021. At that time, the patient last advised to have repeat blood work-up to follow-up her PBC and was continued on ursodiol 300 mg 3 times daily. ? ?Notably, the patient had a liver ultrasound performed on 12/26/2020 that showed a cirrhotic morphology without ascites no masses were seen at that time. ? ?Patient had an antimitochondrial antibody of 114 in 2020. ? ?Her most recent blood work-up from 11/20/2021 showed an alkaline phosphatase of 72, AST 20, ALT 13, total bilirubin 0.8, electrolytes showed a sodium of 133, creatinine 0.69, albumin 3.0, CBC with WBC of 5.2, platelets 265 and hemoglobin of 12.2. ? ?Importantly, she had a stroke on March 2023 - she had a lacunar infarct in the right internal capsule. She has been on rehab after the stroke. She is now able to walk with a walker but still has some slurring of her speech. Daughter believes she may had had another mini stroke since then. She is both on Plavix and aspirin 325 mg. ? ?The patient denies any other complaints.  Denies having any nausea, vomiting, fever, chills, hematochezia, melena, hematemesis, abdominal distention, abdominal pain, diarrhea, jaundice, pruritus or weight loss. ? ?Last  Colonoscopy:2019 ?- Six 5 to 15 mm polyps in the ascending colon, removed with a hot snare. Complete ?resection. Partial retrieval. ?- One 8 mm polyp at the hepatic flexure, removed with a hot snare. Resected and ?retrieved. ?- One small polyp in the ascending colon, removed with a cold snare. Resected and ?retrieved. ?- One 12 mm polyp at the recto-sigmoid colon, removed with a hot snare. Resected and ?retrieved. ?- Diverticulosis in the sigmoid colon. ? ?All polyps were TA ? ?Advised to repeat colonoscopy in 6 months ? ?Past Medical History: ?Past Medical History:  ?Diagnosis Date  ? Absolute anemia 07/28/2018  ? Active smoker   ? Allergy   ? Anxiety   ? Takes Xanax for anxiety  ? Arthritis   ? Asthma   ? Cataract   ? Chronic airway obstruction, not elsewhere classified   ? Clotting disorder ([place])   ? stoke   ? Depression   ? Diarrhea   ? Diverticulosis of colon (without mention of hemorrhage)   ? Esophageal reflux   ? Family history of malignant neoplasm of gastrointestinal tract   ? GI bleed   ? Headache(784.0)   ? Irregular  ? Neuromuscular disorder (HCC)   ? numbness is left hand and left elbow  ? Obesity, unspecified   ? Other and unspecified hyperlipidemia   ? Personal history of colonic polyps   ? Stricture and stenosis of esophagus   ? Stroke [facility])   ? Thyroid disease   ? Type II or unspecified type diabetes mellitus without mention of complication, not stated as uncontrolled   ? Unspecified asthma(493.90)   ? Unspecified essential hypertension   ? ? ?  Past Surgical History: ?Past Surgical History:  ?Procedure Laterality Date  ? APPENDECTOMY    ? BACK SURGERY    ? Spinal surgery  ? BRAIN SURGERY    ? 2013 - stoke ( fall in 2010)   ? CARDIAC CATHETERIZATION  2006  ? LAD: 30%, RCA : 20%, normal EF, elevated LVEDP  ? CARDIAC CATHETERIZATION    ? CEREBRAL ANGIOGRAM  12/20/2015  ? COLONOSCOPY N/A 08/09/2018  ? Procedure: COLONOSCOPY;  Surgeon: Rogene Houston, MD;  [facility];  Service: Endoscopy;   Laterality: N/A;  1:55  ? ESOPHAGEAL DILATION N/A 08/09/2018  ? Procedure: ESOPHAGEAL DILATION;  Surgeon: Rogene Houston, MD;  [facility];  Service: Endoscopy;  Laterality: N/A;  ? ESOPHAGOGASTRODUODENOSCOPY N/A 08/09/2018  ? Procedure: ESOPHAGOGASTRODUODENOSCOPY (EGD);  Surgeon: Rogene Houston, MD;  [facility];  Service: Endoscopy;  Laterality: N/A;  ? GIVENS CAPSULE STUDY N/A 08/25/2018  ? Procedure: GIVENS CAPSULE STUDY;  Surgeon: Rogene Houston, MD;  [facility];  Service: Endoscopy;  Laterality: N/A;  ? IR ANGIO INTRA EXTRACRAN SEL COM CAROTID INNOMINATE BILAT MOD SED  01/14/2018  ? IR ANGIO INTRA EXTRACRAN SEL COM CAROTID INNOMINATE BILAT MOD SED  05/11/2018  ? IR ANGIO VERTEBRAL SEL SUBCLAVIAN INNOMINATE UNI L MOD SED  01/14/2018  ? IR ANGIO VERTEBRAL SEL SUBCLAVIAN INNOMINATE UNI L MOD SED  05/11/2018  ? IR ANGIO VERTEBRAL SEL VERTEBRAL UNI R MOD SED  01/14/2018  ? IR ANGIO VERTEBRAL SEL VERTEBRAL UNI R MOD SED  05/11/2018  ? KNEE ARTHROSCOPY    ? left  ? LUMBAR DISC SURGERY    ? POLYPECTOMY  08/09/2018  ? Procedure: POLYPECTOMY;  Surgeon: Rogene Houston, MD;  [facility];  Service: Endoscopy;;  ascending colon (CS x 2, HS x5) hepatic flexure (HSx1), recto-sigmoid (HSx1)  ? TONSILLECTOMY    ? TOTAL ABDOMINAL HYSTERECTOMY    ? menopause/bleeding  ? ? ?Family History: ?Family History  ?Problem Relation Age of Onset  ? Diabetes Mother   ? Heart disease Mother   ? Asthma Mother   ? Kidney disease Mother   ? Drug abuse Mother   ? Hypertension Mother   ? Stroke Mother   ?     fall -several bleed on brain 1990  ? Early death Father   ?     trauma  ? Colon cancer Other   ?     first cousin, paternal aunt and uncle  ? Cancer Paternal Grandmother   ?     gastric  ? Diabetes Paternal Grandmother   ? Mental illness Paternal Grandmother   ? Heart disease Maternal Grandmother   ? Thyroid cancer Other   ?     aunt  ? Heart disease Sister   ? Mental illness Sister   ? Heart attack  Sister   ? Heart disease Brother   ? Lung disease Brother   ? Cancer Sister   ?     pancreatic  ? Diabetes Sister   ? Hypertension Sister   ? Tuberculosis Maternal Grandfather   ? Heart disease Paternal Grandfather   ? ? ?Social History: ?Social History  ? ?Tobacco Use  ?Smoking Status Former  ? Packs/day: 1.00  ? Years: 20.00  ? Pack years: 20.00  ? Types: Cigarettes  ? Start date: 01/15/1983  ? Quit date: 11/09/2020  ? Years since quitting: 1.1  ?Smokeless Tobacco Never  ? ?Social History  ? ?Substance  and Sexual Activity  ?Alcohol Use No  ? Alcohol/week: 0.0 standard drinks  ? ?Social History  ? ?Substance and Sexual Activity  ?Drug Use No  ? ? ?Allergies: ?Allergies  ?Allergen Reactions  ? Banana Anaphylaxis  ? Codeine Nausea And Vomiting and Other (See Comments)  ?  PROJECTILE VOMITING  ? Stevia [Stevioside] Swelling and Other (See Comments)  ?  Face, tongue, and eye swelling   ? Actos [Pioglitazone] Swelling  ? Benicar [Olmesartan Medoxomil] Other (See Comments)  ?  Feel bad  ? Chantix [Varenicline] Other (See Comments)  ?  "Worked opposite."  ? Diovan [Valsartan] Other (See Comments)  ?  Feel bad  ? Lipitor [Atorvastatin] Other (See Comments)  ?  REACTION:  Joint pain  ? Rice Other (See Comments)  ?  Gives patient indigestion  ? Spinach Other (See Comments)  ?  Stomach problems  ? Vicodin [Hydrocodone-Acetaminophen] Nausea And Vomiting  ? Zocor [Simvastatin] Other (See Comments)  ?  REACTION:  "made liver function incorrectly"  ? Ace Inhibitors Cough  ? Celebrex [Celecoxib] Rash  ? Latex Rash and Other (See Comments)  ?  REACTION: red rash  ? Metformin And Related   ?  GI - upset   ? Mushroom Extract Complex Palpitations  ? Penicillins Rash and Other (See Comments)  ?  Has patient had a PCN reaction causing immediate rash, facial/tongue/throat swelling, SOB or lightheadedness with hypotension: No ?Has patient had a PCN reaction causing severe rash involving mucus membranes or skin necrosis: No ?Has patient had a  PCN reaction that required hospitalization: No- MD office ?Has patient had a PCN reaction occurring within the last 10 years: No ?If all of the above answers are "NO", then may proceed with Cephalosporin use. ?

## 2021-12-26 NOTE — Patient Instructions (Addendum)
-   Continue ursodiol 300 mg TID ?- Perform blood workup ?- Schedule liver elastography ?- Reduce salt intake to <2 g per day ?- Can take Tylenol max of 2 g per day (650 mg q8h) for pain ?- Avoid NSAIDs for pain ?- Avoid eating raw oysters/shellfish ?- Protein shake (Ensure or Boost) every night before going to sleep ?- Will discuss in next appointment if interested in proceeding with repeat colonoscopy OFF aspirin and Plavix  ?

## 2021-12-27 ENCOUNTER — Encounter: Payer: Self-pay | Admitting: Nurse Practitioner

## 2021-12-27 ENCOUNTER — Ambulatory Visit (INDEPENDENT_AMBULATORY_CARE_PROVIDER_SITE_OTHER): Payer: Medicare Other | Admitting: Nurse Practitioner

## 2021-12-27 VITALS — BP 127/66 | HR 73 | Ht 62.0 in | Wt 140.0 lb

## 2021-12-27 DIAGNOSIS — W19XXXA Unspecified fall, initial encounter: Secondary | ICD-10-CM

## 2021-12-27 DIAGNOSIS — E1159 Type 2 diabetes mellitus with other circulatory complications: Secondary | ICD-10-CM | POA: Diagnosis not present

## 2021-12-27 DIAGNOSIS — I69322 Dysarthria following cerebral infarction: Secondary | ICD-10-CM | POA: Diagnosis not present

## 2021-12-27 DIAGNOSIS — Z7902 Long term (current) use of antithrombotics/antiplatelets: Secondary | ICD-10-CM | POA: Diagnosis not present

## 2021-12-27 DIAGNOSIS — R1310 Dysphagia, unspecified: Secondary | ICD-10-CM | POA: Diagnosis not present

## 2021-12-27 DIAGNOSIS — K219 Gastro-esophageal reflux disease without esophagitis: Secondary | ICD-10-CM | POA: Diagnosis not present

## 2021-12-27 DIAGNOSIS — I152 Hypertension secondary to endocrine disorders: Secondary | ICD-10-CM

## 2021-12-27 DIAGNOSIS — E1169 Type 2 diabetes mellitus with other specified complication: Secondary | ICD-10-CM

## 2021-12-27 DIAGNOSIS — E1142 Type 2 diabetes mellitus with diabetic polyneuropathy: Secondary | ICD-10-CM

## 2021-12-27 DIAGNOSIS — I10 Essential (primary) hypertension: Secondary | ICD-10-CM | POA: Diagnosis not present

## 2021-12-27 DIAGNOSIS — E785 Hyperlipidemia, unspecified: Secondary | ICD-10-CM | POA: Diagnosis not present

## 2021-12-27 DIAGNOSIS — I6381 Other cerebral infarction due to occlusion or stenosis of small artery: Secondary | ICD-10-CM | POA: Diagnosis not present

## 2021-12-27 DIAGNOSIS — Z9181 History of falling: Secondary | ICD-10-CM | POA: Diagnosis not present

## 2021-12-27 DIAGNOSIS — I69354 Hemiplegia and hemiparesis following cerebral infarction affecting left non-dominant side: Secondary | ICD-10-CM | POA: Diagnosis not present

## 2021-12-27 DIAGNOSIS — E119 Type 2 diabetes mellitus without complications: Secondary | ICD-10-CM | POA: Diagnosis not present

## 2021-12-27 DIAGNOSIS — J449 Chronic obstructive pulmonary disease, unspecified: Secondary | ICD-10-CM | POA: Diagnosis not present

## 2021-12-27 NOTE — Assessment & Plan Note (Signed)
BP Readings from Last 3 Encounters:  ?12/27/21 127/66  ?12/26/21 (!) 135/56  ?12/19/21 130/80  ?Chronic condition well-controlled on amlodipine 10 mg daily ?Continue current medication ?Dash diet encouraged, exercise daily as tolerated ?Lab Results  ?Component Value Date  ? NA 136 12/10/2021  ? K 3.9 12/10/2021  ? CO2 25 12/10/2021  ? GLUCOSE 103 (H) 12/10/2021  ? BUN 17 12/10/2021  ? CREATININE 0.72 12/10/2021  ? CALCIUM 9.8 12/10/2021  ? EGFR 78 08/26/2021  ? GFRNONAA >60 12/10/2021  ? ?Follow-up in 4 months ?

## 2021-12-27 NOTE — Assessment & Plan Note (Addendum)
Recently had stroke ?Fall most likely due to to weakness from past stroke ?Denies hitting her head ?Currently doing physical therapy and OT at home ?Patient encouraged to use her walker and exercise as tolerated to rebuild her strength. ?Need to prevent falls discussed.  ?

## 2021-12-27 NOTE — Assessment & Plan Note (Signed)
Recently had stroke on 11/08/2021 ?Has history of prior stroke ?Currently on Plavix 75 mg daily, has statin allergy ?Has upcoming appointments with neurology ?Discharged from ER about 2 weeks ago ?Now attending therapy at home. ? ?

## 2021-12-27 NOTE — Progress Notes (Addendum)
? ?Elizabeth [name]     MRN: [id number]      DOB: [birth year] ? ? ?HPI ?Elizabeth [name] with past medical history of hypertension associated with diabetes, CVA, COPD, GERD, thyroid disease is here for follow up and re-evaluation of chronic medical conditions, medication management. ? ?Patient recently had stroke on 11/08/21 was admitted to the hospital after hospital discharge patient was admitted to rehab from March 14 and discharged on April 5.  She has been having OT and PT session at home.  ?Patient stated that she fell 4 days ago, she was walking to the bathroom with her walker when she felt weak and fell, she denies passing out, hitting her head on the floor, she has had left-sided weakness since having stroke in March. Has upcoming appointment with neurology. ? ?Patient is due for shingles vaccine COVID-vaccine Tdap vaccine, patient encouraged to get the recommended vaccines at her pharmacy he verbalized understanding. ? ?ROS ?Denies recent fever or chills. ?Denies sinus pressure, nasal congestion, ear pain or sore throat. ?Denies chest congestion, productive cough or wheezing. ?Denies chest pains, palpitations and leg swelling ?Denies abdominal pain, nausea, vomiting,diarrhea or constipation.   ?Denies dysuria, frequency, hesitancy or incontinence. ?Denies headaches, seizures, numbness, or tingling. ?Denies depression, anxiety or insomnia. ? ? ? ?PE ? ?BP 127/66 (BP Location: Right Arm, Patient Position: Sitting, Cuff Size: Large)   Pulse 73   Ht 5' 2" (1.575 m)   Wt 140 lb (63.5 kg)   SpO2 96%   BMI 25.61 kg/m?  ? ?Patient alert and oriented and in no cardiopulmonary distress. ? ?Chest: Clear to auscultation bilaterally. ? ?CVS: S1, S2 no murmurs, no S3.Regular rate. ? ?ABD: Soft non tender.  ? ?Ext: No edema ? ?MS:   Patient sitting in a wheelchair today, has left arm weakness, power 5/5 on right arm, bilateral lower extremity, sensation intact,  ? ?Psych: Good eye contact, normal affect. Memory intact not anxious  or depressed appearing. ? ?Assessment & Plan ? ?Hypertension associated with diabetes ([place]) ?BP Readings from Last 3 Encounters:  ?12/27/21 127/66  ?12/26/21 (!) 135/56  ?12/19/21 130/80  ?Chronic condition well-controlled on amlodipine 10 mg daily ?Continue current medication ?Dash diet encouraged, exercise daily as tolerated ?Lab Results  ?Component Value Date  ? NA 136 12/10/2021  ? K 3.9 12/10/2021  ? CO2 25 12/10/2021  ? GLUCOSE 103 (H) 12/10/2021  ? BUN 17 12/10/2021  ? CREATININE 0.72 12/10/2021  ? CALCIUM 9.8 12/10/2021  ? EGFR 78 08/26/2021  ? GFRNONAA >60 12/10/2021  ? ?Follow-up in 4 months ? ?Type 2 diabetes mellitus with peripheral neuropathy ?Lab Results  ?Component Value Date  ? HGBA1C 5.8 (H) 08/26/2021  ?Chronic condition, well controlled ?On Jardiance 10 mg daily ?Check A1c today ?Diabetic eye exam to be scheduled by the patient ?Foot exam done today ?Allergic to ACE inhibitors, statin ? ?Hyperlipidemia associated with type 2 diabetes mellitus ([place]) ?Lab Results  ?Component Value Date  ? CHOL 232 (H) 08/26/2021  ? HDL 58 08/26/2021  ? LDLCALC 156 (H) 08/26/2021  ? TRIG 101 08/26/2021  ? CHOLHDL 4.0 08/26/2021  ?LDL goal is less than 55 ?Intolerance to statins and Zetia ?I discussed need for hyperlipidemia management to reduce risk of having more stroke,or heart attack patient and her family refused recommendations for pharmacological treatment ?Check lipid panel ? ?Right-sided lacunar stroke ([place]) ?Recently had stroke on 11/08/2021 ?Has history of prior stroke ?Currently on Plavix 75 mg daily, has statin allergy ?Has upcoming appointments with  neurology ?Discharged from ER about 2 weeks ago ?Now attending therapy at home. ? ? ?Fall ?Recently had stroke ?Fall most likely due to to weakness from past stroke ?Denies hitting her head ?Currently doing physical therapy and OT at home ?Patient encouraged to use her walker and exercise as tolerated to rebuild her strength. ?Need to prevent falls discussed.    ?

## 2021-12-27 NOTE — Assessment & Plan Note (Addendum)
Lab Results  ?Component Value Date  ? CHOL 232 (H) 08/26/2021  ? HDL 58 08/26/2021  ? LDLCALC 156 (H) 08/26/2021  ? TRIG 101 08/26/2021  ? CHOLHDL 4.0 08/26/2021  ?LDL goal is less than 55 ?Intolerance to statins and Zetia ?I discussed need for hyperlipidemia management to reduce risk of having more stroke,or heart attack patient and her family refused recommendations for pharmacological treatment ?Check lipid panel ?

## 2021-12-27 NOTE — Patient Instructions (Addendum)
Pleas get your TDAP, shingles , pneumonia vaccine, covid vaccine at your pharmacy .  ? ?It is important that you exercise regularly at least 30 minutes 5 times a week as tolerated  ?Think about what you will eat, plan ahead. ?Choose " clean, green, fresh or frozen" over canned, processed or packaged foods which are more sugary, salty and fatty. ?70 to 75% of food eaten should be vegetables and fruit. ?Three meals at set times with snacks allowed between meals, but they must be fruit or vegetables. ?Aim to eat over a 12 hour period , example 7 am to 7 pm, and STOP after  your last meal of the day. ?Drink water,generally about 64 ounces per day, no other drink is as healthy. Fruit juice is best enjoyed in a healthy way, by EATING the fruit. ? ?Thanks for choosing [facility], we consider it a privelige to serve you.  ?

## 2021-12-27 NOTE — Assessment & Plan Note (Deleted)
Recently had stroke on 11/08/2021 ?Has history of prior stroke ?Currently on Plavix 75 mg daily, has statin allergy ?Has upcoming appointments with neurology ?Discharged from ER about 2 weeks ago ?Now attending therapy at home. ?

## 2021-12-27 NOTE — Assessment & Plan Note (Signed)
Lab Results  ?Component Value Date  ? HGBA1C 5.8 (H) 08/26/2021  ?Chronic condition, well controlled ?On Jardiance 10 mg daily ?Check A1c today ?Diabetic eye exam to be scheduled by the patient ?Foot exam done today ?Allergic to ACE inhibitors, statin ?

## 2021-12-31 ENCOUNTER — Encounter: Payer: Self-pay | Admitting: Registered Nurse

## 2021-12-31 ENCOUNTER — Encounter: Payer: Medicare Other | Attending: Registered Nurse | Admitting: Registered Nurse

## 2021-12-31 VITALS — BP 119/66 | HR 65 | Ht 62.0 in

## 2021-12-31 DIAGNOSIS — I10 Essential (primary) hypertension: Secondary | ICD-10-CM | POA: Diagnosis not present

## 2021-12-31 DIAGNOSIS — I6381 Other cerebral infarction due to occlusion or stenosis of small artery: Secondary | ICD-10-CM | POA: Insufficient documentation

## 2021-12-31 NOTE — Patient Instructions (Signed)
Dr. Harl Bowie  Cardiology  ?[place], [place] [address] ?[place]  [id number] ?

## 2021-12-31 NOTE — Progress Notes (Signed)
? ?Subjective:  ? ? Patient ID: Elizabeth [name], female    DOB: 06-21-1941, 81 y.o.   MRN: [id number] ? ?HPI: Elizabeth [name] is a 81 y.o. female who is here for [place] appointment for follow up of her Right Sided Lacunar Stroke and Essential Hypertension. She presented to [facility] on 11/2021 with left facial droop and left upper extremity weakness.  ? ?Discharge Summary: Desmond Dike, FNP ?Hospital Course:  ?Elizabeth [name] is a 81 y.o. female that presented to [facility] and was admitted for Cerebrovascular accident (CVA) ([place]) on 11/08/2021 4:19 PM . ? ?Patient's initial presentation to the ED on 3/3 with complaints of left facial droop and left upper extremity weakness that started approximately 4 to 5 hours prior to arrival. Tele neurologist was consulted and CTA was performed, it was a Friday afternoon and MRI was not available until Monday morning. Patient's family requested that she be transferred to [facility] and she was accepted at [facility] with no bed availability. When Monday morning, 3/6 came and the patient was still in the ED at our facility MRI was performed and confirmed lacunar infarct in right internal capsule. Normal echo with no evidence of PFO, EF 55% with grade 1 diastolic dysfunction. Speech therapy saw the patient and recommended MBSS. Patient's family decided to have her admitted at this facility. ? ?Physical therapy and Occupational Therapy saw the patient and recommended SNF. 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 Recommendation from speech for pur?ed level 4 diet with mildly thick liquids level 2. Patient continued on this diet throughout hospitalization and tolerated well. This is recommended diet at discharge. ? ?Patient will continue Plavix therapy and aspirin. She reports an intolerance to statin medications. ? ?CT Head: WO Contrast:   ?1. No acute intracranial abnormality or significant interval change.  ?2. Aspects 10/10  ?3. Stable atrophy and white matter disease. This likely reflects the  ?sequela of chronic microvascular ischemia.   ? ?CTA: Head and Neck ?CTA neck:  ? ?1. No significant interval change as compared to the prior CTA neck  ?of 06/10/2021.  ?2. The common carotid and internal carotid arteries are patent  ?within the neck without hemodynamically significant stenosis (50% or  ?greater). Atherosclerotic plaque within both carotid systems within  ?the neck, as described.  ?3. Vertebral arteries patent within the neck. As before, there is  ?mild-to-moderate atherosclerotic narrowing at the origin of the  ?dominant right vertebral artery, and suspected at least moderate  ?atherosclerotic stenosis at the origin of the non-dominant left  ?vertebral artery.  ?4.  Aortic Atherosclerosis (ICD10-I70.0).  ? ?CTA head:  ? ?1. No intracranial large vessel occlusion.  ?2. Intracranial atherosclerotic disease with multifocal stenoses,  ?most notably as follows.  ?3. Unchanged moderate/severe stenosis within the distal M1 right  ?middle cerebral artery.  ?4. Progressive severe stenosis within an inferior division proximal  ?M2 left MCA vessel.  ?5. Progressive at least moderate stenosis within a P2 right PCA  ?branch.  ?6. Prior treatment of anterior cerebral artery aneurysms. Streak and  ?beam hardening artifact arising from the embolization coils limits  ?evaluation for residual enhancement of the treated aneurysms.   ? ?MRI Brain:  ?1. Acute lacunar infarct in the Right internal capsule. No  ?associated hemorrhage or mass effect.  ?2. 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   ? ?Elizabeth [name] was admitted to inpatient rehabilitation on 11/19/2021 and discharged home on 12/11/2021. She is receiving Home Health Therapy with [facility]. She denies any pain at this time. She rates her pain 0.  ?She states  she is nauseated this morning.  ? ?Pain Inventory ?Average Pain 0 ?Pain Right Now 0 ?My pain is  No pain ? ?LOCATION OF PAIN  left hand numbness ? ?BOWEL ?Number of stools per week: 7 ? ? ?BLADDER ?Normal ? ? ?Frequent urination Yes , OAB ? ?Mobility ?walk with assistance ?use a walker ?ability to climb steps?  no ?do you drive?  no ?use a wheelchair ?needs help with transfers ?Do you have any goals in this area?  yes ? ?Function ?retired ?I need assistance with the following:  dressing, bathing, toileting, meal prep, household duties, and shopping ?Do you have any goals in this area?  yes ? ?Neuro/Psych ?weakness ?numbness ?trouble walking ?depression ?anxiety ? ?Prior Studies ?Any changes since last visit?  no ? ?Physicians involved in your care ?Any changes since last visit?  no ? ? ?Family History  ?Problem Relation Age of Onset  ? Diabetes Mother   ? Heart disease Mother   ? Asthma Mother   ? Kidney disease Mother   ? Drug abuse Mother   ? Hypertension Mother   ? Stroke Mother   ?     fall -several bleed on brain 1990  ? Early death Father   ?     trauma  ? Colon cancer Other   ?     first cousin, paternal aunt and uncle  ? Cancer Paternal Grandmother   ?     gastric  ? Diabetes Paternal Grandmother   ? Mental illness Paternal Grandmother   ? Heart disease Maternal Grandmother   ? Thyroid cancer Other   ?     aunt  ? Heart disease Sister   ? Mental illness Sister   ? Heart attack Sister   ? Heart disease Brother   ? Lung disease Brother   ? Cancer Sister   ?     pancreatic  ? Diabetes Sister   ? Hypertension Sister   ? Tuberculosis Maternal Grandfather   ? Heart disease Paternal Grandfather   ? ?Social History  ? ?Socioeconomic History  ? Marital status: Married  ?  Spouse name: [name]  ? Number of children: 0  ? Years of education: 45  ? Highest education level: Bachelor's degree (e.g., BA, AB, BS)  ?Occupational History  ? Occupation: retired   ?  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 Smoking  status: Former  ?  Packs/day: 1.00  ?  Years: 20.00  ?  Pack years: 20.00  ?  Types: Cigarettes  ?  Start date: 01/15/1983  ?  Quit date: 11/09/2020  ?  Years since quitting: 1.1  ? Smokeless tobacco: Never  ?Vaping Use  ? Vaping Use: Some days  ?Substance and Sexual Activity  ? Alcohol use: No  ?  Alcohol/week: 0.0 standard drinks  ? Drug use: No  ? Sexual activity: Not Currently  ?Other Topics Concern  ? Not on file  ?Social History Narrative  ? Married [name]  ? Limited activity due to back pain  ? ?Social Determinants of Health  ? ?Financial Resource Strain: Low Risk   ? Difficulty of Paying Living Expenses: Not hard at all  ?Food Insecurity: No Food Insecurity  ? Worried  About Running Out of Food in the Last Year: Never true  ? Ran Out of Food in the Last Year: Never true  ?Transportation Needs: No Transportation Needs  ? Lack of Transportation (Medical): No  ? Lack of Transportation (Non-Medical): No  ?Physical Activity: Insufficiently Active  ? Days of Exercise per Week: 3 days  ? Minutes of Exercise per Session: 20 min  ?Stress: No Stress Concern Present  ? Feeling of Stress : Not at all  ?Social Connections: Moderately Isolated  ? Frequency of Communication with Friends and Family: More than three times a week  ? Frequency of Social Gatherings with Friends and Family: More than three times a week  ? Attends Religious Services: Never  ? Active Member of Clubs or Organizations: No  ? Attends Archivist Meetings: Never  ? Marital Status: Married  ? ?Past Surgical History:  ?Procedure Laterality Date  ? APPENDECTOMY    ? BACK SURGERY    ? Spinal surgery  ? BRAIN SURGERY    ? 2013 - stoke ( fall in 2010)   ? CARDIAC CATHETERIZATION  2006  ? LAD: 30%, RCA : 20%, normal EF, elevated LVEDP  ? CARDIAC CATHETERIZATION    ? CEREBRAL ANGIOGRAM  12/20/2015  ? COLONOSCOPY N/A 08/09/2018  ? Procedure: COLONOSCOPY;  Surgeon: Rogene Houston, MD;  [facility];  Service: Endoscopy;  Laterality: N/A;  1:55   ? ESOPHAGEAL DILATION N/A 08/09/2018  ? Procedure: ESOPHAGEAL DILATION;  Surgeon: Rogene Houston, MD;  [facility];  Service: Endoscopy;  Laterality: N/A;  ? ESOPHAGOGASTRODUODENOSCOPY N/A 12

## 2022-01-04 DIAGNOSIS — Z7902 Long term (current) use of antithrombotics/antiplatelets: Secondary | ICD-10-CM | POA: Diagnosis not present

## 2022-01-04 DIAGNOSIS — E785 Hyperlipidemia, unspecified: Secondary | ICD-10-CM | POA: Diagnosis not present

## 2022-01-04 DIAGNOSIS — R1310 Dysphagia, unspecified: Secondary | ICD-10-CM | POA: Diagnosis not present

## 2022-01-04 DIAGNOSIS — Z9181 History of falling: Secondary | ICD-10-CM | POA: Diagnosis not present

## 2022-01-04 DIAGNOSIS — I69322 Dysarthria following cerebral infarction: Secondary | ICD-10-CM | POA: Diagnosis not present

## 2022-01-04 DIAGNOSIS — J449 Chronic obstructive pulmonary disease, unspecified: Secondary | ICD-10-CM | POA: Diagnosis not present

## 2022-01-04 DIAGNOSIS — I10 Essential (primary) hypertension: Secondary | ICD-10-CM | POA: Diagnosis not present

## 2022-01-04 DIAGNOSIS — K219 Gastro-esophageal reflux disease without esophagitis: Secondary | ICD-10-CM | POA: Diagnosis not present

## 2022-01-04 DIAGNOSIS — E119 Type 2 diabetes mellitus without complications: Secondary | ICD-10-CM | POA: Diagnosis not present

## 2022-01-04 DIAGNOSIS — I69354 Hemiplegia and hemiparesis following cerebral infarction affecting left non-dominant side: Secondary | ICD-10-CM | POA: Diagnosis not present

## 2022-01-07 DIAGNOSIS — I10 Essential (primary) hypertension: Secondary | ICD-10-CM | POA: Diagnosis not present

## 2022-01-07 DIAGNOSIS — K219 Gastro-esophageal reflux disease without esophagitis: Secondary | ICD-10-CM | POA: Diagnosis not present

## 2022-01-07 DIAGNOSIS — I69354 Hemiplegia and hemiparesis following cerebral infarction affecting left non-dominant side: Secondary | ICD-10-CM | POA: Diagnosis not present

## 2022-01-07 DIAGNOSIS — E785 Hyperlipidemia, unspecified: Secondary | ICD-10-CM | POA: Diagnosis not present

## 2022-01-07 DIAGNOSIS — R1310 Dysphagia, unspecified: Secondary | ICD-10-CM | POA: Diagnosis not present

## 2022-01-07 DIAGNOSIS — E119 Type 2 diabetes mellitus without complications: Secondary | ICD-10-CM | POA: Diagnosis not present

## 2022-01-07 DIAGNOSIS — J449 Chronic obstructive pulmonary disease, unspecified: Secondary | ICD-10-CM | POA: Diagnosis not present

## 2022-01-07 DIAGNOSIS — Z9181 History of falling: Secondary | ICD-10-CM | POA: Diagnosis not present

## 2022-01-07 DIAGNOSIS — I69322 Dysarthria following cerebral infarction: Secondary | ICD-10-CM | POA: Diagnosis not present

## 2022-01-07 DIAGNOSIS — Z7902 Long term (current) use of antithrombotics/antiplatelets: Secondary | ICD-10-CM | POA: Diagnosis not present

## 2022-01-10 DIAGNOSIS — J449 Chronic obstructive pulmonary disease, unspecified: Secondary | ICD-10-CM | POA: Diagnosis not present

## 2022-01-10 DIAGNOSIS — I69322 Dysarthria following cerebral infarction: Secondary | ICD-10-CM | POA: Diagnosis not present

## 2022-01-10 DIAGNOSIS — E119 Type 2 diabetes mellitus without complications: Secondary | ICD-10-CM | POA: Diagnosis not present

## 2022-01-10 DIAGNOSIS — I10 Essential (primary) hypertension: Secondary | ICD-10-CM | POA: Diagnosis not present

## 2022-01-10 DIAGNOSIS — I69354 Hemiplegia and hemiparesis following cerebral infarction affecting left non-dominant side: Secondary | ICD-10-CM | POA: Diagnosis not present

## 2022-01-10 DIAGNOSIS — Z7902 Long term (current) use of antithrombotics/antiplatelets: Secondary | ICD-10-CM | POA: Diagnosis not present

## 2022-01-10 DIAGNOSIS — I6381 Other cerebral infarction due to occlusion or stenosis of small artery: Secondary | ICD-10-CM | POA: Diagnosis not present

## 2022-01-10 DIAGNOSIS — K219 Gastro-esophageal reflux disease without esophagitis: Secondary | ICD-10-CM | POA: Diagnosis not present

## 2022-01-10 DIAGNOSIS — Z9181 History of falling: Secondary | ICD-10-CM | POA: Diagnosis not present

## 2022-01-10 DIAGNOSIS — E785 Hyperlipidemia, unspecified: Secondary | ICD-10-CM | POA: Diagnosis not present

## 2022-01-10 DIAGNOSIS — R1310 Dysphagia, unspecified: Secondary | ICD-10-CM | POA: Diagnosis not present

## 2022-01-13 ENCOUNTER — Ambulatory Visit (HOSPITAL_COMMUNITY)
Admission: RE | Admit: 2022-01-13 | Discharge: 2022-01-13 | Disposition: A | Discharge status: Home / Self Care | Payer: Medicare Other | Source: Ambulatory Visit | Attending: Nurse Practitioner | Admitting: Nurse Practitioner

## 2022-01-13 ENCOUNTER — Ambulatory Visit (HOSPITAL_COMMUNITY)
Admission: RE | Admit: 2022-01-13 | Discharge: 2022-01-13 | Disposition: A | Discharge status: Home / Self Care | Payer: Medicare Other | Source: Ambulatory Visit | Attending: Gastroenterology | Admitting: Gastroenterology

## 2022-01-13 DIAGNOSIS — K7400 Hepatic fibrosis, unspecified: Secondary | ICD-10-CM | POA: Insufficient documentation

## 2022-01-13 DIAGNOSIS — Z780 Asymptomatic menopausal state: Secondary | ICD-10-CM | POA: Insufficient documentation

## 2022-01-13 DIAGNOSIS — Z1231 Encounter for screening mammogram for malignant neoplasm of breast: Secondary | ICD-10-CM | POA: Insufficient documentation

## 2022-01-13 DIAGNOSIS — K7460 Unspecified cirrhosis of liver: Secondary | ICD-10-CM | POA: Diagnosis not present

## 2022-01-13 DIAGNOSIS — R932 Abnormal findings on diagnostic imaging of liver and biliary tract: Secondary | ICD-10-CM | POA: Insufficient documentation

## 2022-01-13 DIAGNOSIS — K743 Primary biliary cirrhosis: Secondary | ICD-10-CM

## 2022-01-14 ENCOUNTER — Ambulatory Visit (HOSPITAL_COMMUNITY): Payer: Medicare Other

## 2022-01-14 DIAGNOSIS — J449 Chronic obstructive pulmonary disease, unspecified: Secondary | ICD-10-CM | POA: Diagnosis not present

## 2022-01-14 DIAGNOSIS — I10 Essential (primary) hypertension: Secondary | ICD-10-CM | POA: Diagnosis not present

## 2022-01-14 DIAGNOSIS — K219 Gastro-esophageal reflux disease without esophagitis: Secondary | ICD-10-CM | POA: Diagnosis not present

## 2022-01-14 DIAGNOSIS — E785 Hyperlipidemia, unspecified: Secondary | ICD-10-CM | POA: Diagnosis not present

## 2022-01-14 DIAGNOSIS — Z7902 Long term (current) use of antithrombotics/antiplatelets: Secondary | ICD-10-CM | POA: Diagnosis not present

## 2022-01-14 DIAGNOSIS — I69322 Dysarthria following cerebral infarction: Secondary | ICD-10-CM | POA: Diagnosis not present

## 2022-01-14 DIAGNOSIS — E119 Type 2 diabetes mellitus without complications: Secondary | ICD-10-CM | POA: Diagnosis not present

## 2022-01-14 DIAGNOSIS — R1310 Dysphagia, unspecified: Secondary | ICD-10-CM | POA: Diagnosis not present

## 2022-01-14 DIAGNOSIS — I69354 Hemiplegia and hemiparesis following cerebral infarction affecting left non-dominant side: Secondary | ICD-10-CM | POA: Diagnosis not present

## 2022-01-14 DIAGNOSIS — Z9181 History of falling: Secondary | ICD-10-CM | POA: Diagnosis not present

## 2022-01-16 DIAGNOSIS — R1310 Dysphagia, unspecified: Secondary | ICD-10-CM | POA: Diagnosis not present

## 2022-01-16 DIAGNOSIS — I10 Essential (primary) hypertension: Secondary | ICD-10-CM | POA: Diagnosis not present

## 2022-01-16 DIAGNOSIS — I69322 Dysarthria following cerebral infarction: Secondary | ICD-10-CM | POA: Diagnosis not present

## 2022-01-16 DIAGNOSIS — E119 Type 2 diabetes mellitus without complications: Secondary | ICD-10-CM | POA: Diagnosis not present

## 2022-01-16 DIAGNOSIS — Z9181 History of falling: Secondary | ICD-10-CM | POA: Diagnosis not present

## 2022-01-16 DIAGNOSIS — K219 Gastro-esophageal reflux disease without esophagitis: Secondary | ICD-10-CM | POA: Diagnosis not present

## 2022-01-16 DIAGNOSIS — I69354 Hemiplegia and hemiparesis following cerebral infarction affecting left non-dominant side: Secondary | ICD-10-CM | POA: Diagnosis not present

## 2022-01-16 DIAGNOSIS — J449 Chronic obstructive pulmonary disease, unspecified: Secondary | ICD-10-CM | POA: Diagnosis not present

## 2022-01-16 DIAGNOSIS — Z7902 Long term (current) use of antithrombotics/antiplatelets: Secondary | ICD-10-CM | POA: Diagnosis not present

## 2022-01-16 DIAGNOSIS — E785 Hyperlipidemia, unspecified: Secondary | ICD-10-CM | POA: Diagnosis not present

## 2022-01-21 ENCOUNTER — Telehealth: Payer: Self-pay | Admitting: *Deleted

## 2022-01-21 ENCOUNTER — Telehealth: Payer: Medicare Other

## 2022-01-21 DIAGNOSIS — R1310 Dysphagia, unspecified: Secondary | ICD-10-CM | POA: Diagnosis not present

## 2022-01-21 DIAGNOSIS — J449 Chronic obstructive pulmonary disease, unspecified: Secondary | ICD-10-CM | POA: Diagnosis not present

## 2022-01-21 DIAGNOSIS — Z9181 History of falling: Secondary | ICD-10-CM | POA: Diagnosis not present

## 2022-01-21 DIAGNOSIS — E785 Hyperlipidemia, unspecified: Secondary | ICD-10-CM | POA: Diagnosis not present

## 2022-01-21 DIAGNOSIS — I69322 Dysarthria following cerebral infarction: Secondary | ICD-10-CM | POA: Diagnosis not present

## 2022-01-21 DIAGNOSIS — E119 Type 2 diabetes mellitus without complications: Secondary | ICD-10-CM | POA: Diagnosis not present

## 2022-01-21 DIAGNOSIS — Z7902 Long term (current) use of antithrombotics/antiplatelets: Secondary | ICD-10-CM | POA: Diagnosis not present

## 2022-01-21 DIAGNOSIS — I10 Essential (primary) hypertension: Secondary | ICD-10-CM | POA: Diagnosis not present

## 2022-01-21 DIAGNOSIS — R41841 Cognitive communication deficit: Secondary | ICD-10-CM | POA: Diagnosis not present

## 2022-01-21 DIAGNOSIS — K219 Gastro-esophageal reflux disease without esophagitis: Secondary | ICD-10-CM | POA: Diagnosis not present

## 2022-01-21 DIAGNOSIS — I69354 Hemiplegia and hemiparesis following cerebral infarction affecting left non-dominant side: Secondary | ICD-10-CM | POA: Diagnosis not present

## 2022-01-21 NOTE — Telephone Encounter (Signed)
?  Care Management  ? ?Follow Up Note ? ? ?01/21/2022 ?Name: ARDYN FORGE MRN: [id number] DOB: Apr 13, 1941 ? ? ?Referred by: Renee Rival, FNP ?Reason for referral : Chronic Care Management (HTN, History of stroke/ HLD, DM2) ? ? ?An unsuccessful telephone outreach was attempted today. The patient was referred to the case management team for assistance with care management and care coordination.  ? ?Follow Up Plan: Telephone follow up appointment with care management team member scheduled for: upon care guide rescheduling. ? ?Jacqlyn Larsen RNC, BSN ?RN Case Manager ?[facility] ?[phone] ? ? ?

## 2022-01-21 NOTE — Progress Notes (Signed)
There are no findings suspicious for malignancy.

## 2022-01-22 ENCOUNTER — Other Ambulatory Visit (INDEPENDENT_AMBULATORY_CARE_PROVIDER_SITE_OTHER): Payer: Self-pay | Admitting: Internal Medicine

## 2022-01-22 ENCOUNTER — Ambulatory Visit: Payer: Medicare Other | Admitting: Nurse Practitioner

## 2022-01-23 DIAGNOSIS — E785 Hyperlipidemia, unspecified: Secondary | ICD-10-CM | POA: Diagnosis not present

## 2022-01-23 DIAGNOSIS — I10 Essential (primary) hypertension: Secondary | ICD-10-CM | POA: Diagnosis not present

## 2022-01-23 DIAGNOSIS — K219 Gastro-esophageal reflux disease without esophagitis: Secondary | ICD-10-CM | POA: Diagnosis not present

## 2022-01-23 DIAGNOSIS — Z7902 Long term (current) use of antithrombotics/antiplatelets: Secondary | ICD-10-CM | POA: Diagnosis not present

## 2022-01-23 DIAGNOSIS — J449 Chronic obstructive pulmonary disease, unspecified: Secondary | ICD-10-CM | POA: Diagnosis not present

## 2022-01-23 DIAGNOSIS — E119 Type 2 diabetes mellitus without complications: Secondary | ICD-10-CM | POA: Diagnosis not present

## 2022-01-23 DIAGNOSIS — R1310 Dysphagia, unspecified: Secondary | ICD-10-CM | POA: Diagnosis not present

## 2022-01-23 DIAGNOSIS — I69322 Dysarthria following cerebral infarction: Secondary | ICD-10-CM | POA: Diagnosis not present

## 2022-01-23 DIAGNOSIS — I69354 Hemiplegia and hemiparesis following cerebral infarction affecting left non-dominant side: Secondary | ICD-10-CM | POA: Diagnosis not present

## 2022-01-23 DIAGNOSIS — Z9181 History of falling: Secondary | ICD-10-CM | POA: Diagnosis not present

## 2022-01-28 DIAGNOSIS — I69322 Dysarthria following cerebral infarction: Secondary | ICD-10-CM | POA: Diagnosis not present

## 2022-01-28 DIAGNOSIS — J449 Chronic obstructive pulmonary disease, unspecified: Secondary | ICD-10-CM | POA: Diagnosis not present

## 2022-01-28 DIAGNOSIS — R1310 Dysphagia, unspecified: Secondary | ICD-10-CM | POA: Diagnosis not present

## 2022-01-28 DIAGNOSIS — Z9181 History of falling: Secondary | ICD-10-CM | POA: Diagnosis not present

## 2022-01-28 DIAGNOSIS — E785 Hyperlipidemia, unspecified: Secondary | ICD-10-CM | POA: Diagnosis not present

## 2022-01-28 DIAGNOSIS — K219 Gastro-esophageal reflux disease without esophagitis: Secondary | ICD-10-CM | POA: Diagnosis not present

## 2022-01-28 DIAGNOSIS — E119 Type 2 diabetes mellitus without complications: Secondary | ICD-10-CM | POA: Diagnosis not present

## 2022-01-28 DIAGNOSIS — I69354 Hemiplegia and hemiparesis following cerebral infarction affecting left non-dominant side: Secondary | ICD-10-CM | POA: Diagnosis not present

## 2022-01-28 DIAGNOSIS — Z7902 Long term (current) use of antithrombotics/antiplatelets: Secondary | ICD-10-CM | POA: Diagnosis not present

## 2022-01-28 DIAGNOSIS — I10 Essential (primary) hypertension: Secondary | ICD-10-CM | POA: Diagnosis not present

## 2022-01-29 ENCOUNTER — Ambulatory Visit (INDEPENDENT_AMBULATORY_CARE_PROVIDER_SITE_OTHER): Payer: Medicare Other | Admitting: Nurse Practitioner

## 2022-01-29 ENCOUNTER — Telehealth: Payer: Self-pay

## 2022-01-29 ENCOUNTER — Encounter: Payer: Self-pay | Admitting: Nurse Practitioner

## 2022-01-29 DIAGNOSIS — R42 Dizziness and giddiness: Secondary | ICD-10-CM

## 2022-01-29 MED ORDER — MECLIZINE HCL 12.5 MG PO TABS: 12.5000 mg | ORAL_TABLET | Freq: Three times a day (TID) | ORAL | 0 refills | Status: DC | PRN

## 2022-01-29 NOTE — Telephone Encounter (Signed)
Pt added to today's schedule

## 2022-01-29 NOTE — Assessment & Plan Note (Addendum)
Could  be vertigo,  past history of CVA Rx Antivert 12.5 mg every 8 hours as needed for dizziness Follow-up on 6/2 Patient told to go to the emergency room if she starts having severe headache, trouble with her speech, sudden changes in her vision

## 2022-01-29 NOTE — Progress Notes (Signed)
Virtual Visit via Telephone Note  I connected with Elizabeth [name] @ on 01/29/22 at 1105 by telephone and verified that I am speaking with the correct person using two identifiers.  I spent 7 minutes on this telephone encounter.  Location: Patient: home Provider: office   I discussed the limitations, risks, security and privacy concerns of performing an evaluation and management service by telephone and the availability of in person appointments. I also discussed with the patient that there may be a patient responsible charge related to this service. The patient expressed understanding and agreed to proceed.   History of Present Illness: Elizabeth [name] with past medical history of CVA, hypertension, COPD, GERD, diabetes  c/o dizziness since the past two days , stated that she had same symptom some time ago when she had sinus infection. Experiences dizziness when she stands up denies spinning sensation, headaches, changes in her vision, trouble with her speech, ear pain .  Stated that she had used Antivert in the past when she had these symptoms.    Observations/Objective:   Assessment and Plan:  Dizziness Could  be vertigo,  past history of CVA Rx Antivert 12.5 mg every 8 hours as needed for dizziness Follow-up on 6/2 Patient told to go to the emergency room if she starts having severe headache, trouble with her speech, sudden changes in her vision  Follow Up Instructions:    I discussed the assessment and treatment plan with the patient. The patient was provided an opportunity to ask questions and all were answered. The patient agreed with the plan and demonstrated an understanding of the instructions.   The patient was advised to call back or seek an in-person evaluation if the symptoms worsen or if the condition fails to improve as anticipated.

## 2022-01-29 NOTE — Telephone Encounter (Signed)
Patient called has been dizzy has had stroke recently and  asked if advert  can be called into her pharmacy, if not she will wait til next week to see provider.  Pharmacy: [facility]

## 2022-01-29 NOTE — Telephone Encounter (Signed)
Please advise 

## 2022-01-30 ENCOUNTER — Ambulatory Visit: Payer: Medicare Other | Admitting: Neurology

## 2022-01-30 ENCOUNTER — Telehealth: Payer: Self-pay | Admitting: Neurology

## 2022-01-30 ENCOUNTER — Encounter: Payer: Self-pay | Admitting: Neurology

## 2022-01-30 VITALS — BP 139/82 | HR 81 | Ht 62.0 in | Wt 140.0 lb

## 2022-01-30 DIAGNOSIS — R1310 Dysphagia, unspecified: Secondary | ICD-10-CM

## 2022-01-30 DIAGNOSIS — I63031 Cerebral infarction due to thrombosis of right carotid artery: Secondary | ICD-10-CM | POA: Insufficient documentation

## 2022-01-30 DIAGNOSIS — I69354 Hemiplegia and hemiparesis following cerebral infarction affecting left non-dominant side: Secondary | ICD-10-CM | POA: Diagnosis not present

## 2022-01-30 DIAGNOSIS — I6330 Cerebral infarction due to thrombosis of unspecified cerebral artery: Secondary | ICD-10-CM

## 2022-01-30 HISTORY — DX: Cerebral infarction due to thrombosis of right carotid artery: I63.031

## 2022-01-30 HISTORY — DX: Cerebral infarction due to thrombosis of unspecified cerebral artery: I63.30

## 2022-01-30 NOTE — Telephone Encounter (Signed)
I left her message on her cell phone, please call patient's again, check to see if she is on any statin treatment for her hyperlipidemia,  If she is not, we will consider adding Zocor 10 mg daily

## 2022-01-30 NOTE — Telephone Encounter (Addendum)
I spoke to the patient and her husband while on speaker phone. She told me she has tried simvastatin, atorvastatin and rosuvastatin in the past. She said they made her feel bad and her legs hurt. She is also concerned about the effect these medications would have on her liver. I ask if she had ever tried Repatha or Praluent and she has not. She does not wish to start anything right now. States she has an office visit with her PCP on 02/07/22 and would like to talk more to her about controlling her cholesterol.

## 2022-01-30 NOTE — Telephone Encounter (Signed)
Left messages on her mobile and home numbers. Requested a call back.

## 2022-01-30 NOTE — Patient Instructions (Signed)
Stop ASA '325mg'$  qday

## 2022-01-30 NOTE — Progress Notes (Signed)
Chief Complaint  Patient presents with   New Patient (Initial Visit)    Rm 15. Accompanied by husband, Elizabeth [name]. NP internal referral for Right-sided lacunar stroke.      ASSESSMENT AND PLAN  Elizabeth [name] is a 81 y.o. 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    Small vessel disease, acute stroke,  Vascular risk factor of diabetes, hypertension, hyperlipidemia, previous smoker,  She has been on aspirin 325 milligram plus Plavix 75 mg  Advised her stop aspirin 325 mg, keep Plavix 75 mg, she had a history of GI bleeding, is followed up by local gastroenterologist, hemoglobin was 11.9 in April 23, encouraged her to continue follow-up with her GI physician   Dysphagia,  Wet speech, difficulty swallowing her saliva  Will refer to [facility] swallow evaluation   Return to clinic in 6 months with nurse practitioner, if she continues to progress well, may consider discharge to her primary care physician   DIAGNOSTIC DATA (LABS, IMAGING, TESTING) - I reviewed patient records, labs, notes, testing and imaging myself where available. MRI brain at [facility], 1. Acute lacunar infarct in the Right internal capsule. No  associated hemorrhage or mass effect.  2. Underlying severe small vessel disease with progression in the  brainstem since 2020.   Echocardiogram November 11, 2021, ejection fraction more than 55%, left atrium mildly dilated, agitated saline study is negative, no other significant abnormality,  Doppler study of bilateral lower extremity was negative for deep venous thrombosis.  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 PMHX. HTN DM COPD, quit smoke in 2021. 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 Estanislado [name]  She is a retired Marine scientist, highly functional prior to stroke in March, ambulate without difficulty, driving, on November 08, 2021, she had a sudden onset left leg weakness, fell, was treated at [facility] initially, MRI showed acute infarction involving right internal capsule, echocardiogram showed no significant abnormality  CT angiogram of neck showed no significant large vessel stenosis, angiogram of the brain showed multivessel intracranial atherosclerotic disease,  LDL 185, A1c 6.4, hemoglobin 11.7  She was on aspirin 325 mg daily, was put on Plavix 75 mg, continued during antiplatelet treatment since then, she had a history of GI bleeding, continue followed up by a local gastroenterologist,  She was discharged to [facility] rehab later, was not evaluated by stroke service, now receiving home rehab, making progress, mild left hemiparesis, ambulate with assistant,  The most bothersome symptoms is her wet speech, mild difficulty swallowing, which is improving also, but on today's interview, she has difficulty handling her saliva, complains difficulty swallowing, food stuck in her throat, she is eating  close to regular diet at home now, only choke occasionally.   PHYSICAL EXAM:   Vitals:   01/30/22 0910  BP: 139/82  Pulse: 81  Weight: 140 lb (63.5 kg)  Height: '5\' 2"'$   (1.575 m)   Not recorded     Body mass index is 25.61 kg/m.  PHYSICAL EXAMNIATION:  Gen: NAD, conversant, well nourised, well groomed                     Cardiovascular: Regular rate rhythm, no peripheral edema, warm, nontender. Eyes: Conjunctivae clear without exudates or hemorrhage Neck: Supple, no carotid bruits. Pulmonary: Clear to auscultation bilaterally   NEUROLOGICAL EXAM:  MENTAL STATUS: Speech/cognition: Wet speech, dysarthria, frequent drooling, awake, alert, oriented to history taking and casual conversation,  CRANIAL NERVES: CN II: Visual fields are full to confrontation. Pupils are round equal and briskly reactive to light. CN III, IV, VI: extraocular movement are normal. No ptosis. CN V: Facial sensation is intact to light touch CN VII: Left lower face weakness CN VIII: Hearing is normal to causal conversation. CN IX, X: Phonation is normal. CN XI: Head turning and shoulder shrug are intact  MOTOR: Mild spastic left hemiparesis, left upper and lower extremity strengths 4 out of 5,  REFLEXES: Hyperreflexia on the left upper and lower extremity  SENSORY: Intact to light touch,   COORDINATION: There is no trunk or limb dysmetria noted.  GAIT/STANCE: Need help to get up from seated position, cautious, unsteady, dragging left leg  REVIEW OF SYSTEMS:  Full 14 system review of systems performed and notable only for as above All other review of systems were negative.   ALLERGIES: Allergies  Allergen Reactions   Banana Anaphylaxis   Codeine Nausea And Vomiting and Other (See Comments)    PROJECTILE VOMITING   Stevia [Stevioside] Swelling and Other (See Comments)    Face, tongue, and eye swelling    Actos [Pioglitazone] Swelling   Banana Flavor    Benicar [Olmesartan Medoxomil] Other (See Comments)    Feel bad   Chantix [Varenicline] Other (See Comments)    "Worked opposite."   Diovan [Valsartan] Other (See Comments)    Feel bad   Lipitor  [Atorvastatin] Other (See Comments)    REACTION:  Joint pain   Rice Other (See Comments)    Gives patient indigestion   Spinach Other (See Comments)    Stomach problems   Vicodin [Hydrocodone-Acetaminophen] Nausea And Vomiting   Zocor [Simvastatin] Other (See Comments)    REACTION:  "made liver function incorrectly"   Ace Inhibitors Cough   Celebrex [Celecoxib] Rash   Latex Rash and Other (See Comments)    REACTION: red rash   Metformin And Related     GI - upset    Mushroom Extract Complex Palpitations   Penicillins Rash and Other (See Comments)    Has patient had a PCN reaction causing immediate rash, facial/tongue/throat swelling, SOB or lightheadedness with hypotension: No Has patient had a PCN reaction causing severe rash involving mucus membranes or skin necrosis: No Has patient had a PCN reaction that required hospitalization: No- MD office Has patient had a PCN reaction occurring within the last 10 years: No If all of the above answers are "NO", then may proceed with Cephalosporin use.    Sulfonamide Derivatives Rash    HOME MEDICATIONS: Current Outpatient Medications  Medication Sig Dispense Refill   albuterol (VENTOLIN HFA) 108 (90 Base) MCG/ACT inhaler INHALE 2 PUFFS EVERY SIX HOURS AS NEEDED FOR  WHEEZING OR SHORTNESS OF BREATH 8.5 g 2   amLODipine (NORVASC) 10 MG tablet TAKE 1 TABLET BY MOUTH EVERY DAY (Patient taking differently: Take 10 mg by mouth daily.) 90 tablet 5   aspirin 325 MG tablet Take 325 mg by mouth daily.     azelastine (ASTELIN) 0.1 % nasal spray Place 2 sprays into both nostrils 2 (two) times daily. Use in each nostril as directed 30 mL 12   clopidogrel (PLAVIX) 75 MG tablet TAKE ONE TABLET BY MOUTH DAILY (Patient taking differently: Take 75 mg by mouth daily.) 90 tablet 1   empagliflozin (JARDIANCE) 10 MG TABS tablet Take 1 tablet (10 mg total) by mouth daily before breakfast. 14 tablet 0   Grape Seed OIL by Does not apply route daily at 6 (six) AM.      meclizine (ANTIVERT) 12.5 MG tablet Take 1 tablet (12.5 mg total) by mouth 3 (three) times daily as needed for dizziness. 15 tablet 0   methocarbamol (ROBAXIN) 500 MG tablet Take 1 tablet (500 mg total) by mouth every 6 (six) hours as needed for muscle spasms. 30 tablet 0   montelukast (SINGULAIR) 10 MG tablet Take 1 tablet (10 mg total) by mouth at bedtime. 30 tablet 3   OVER THE COUNTER MEDICATION daily at 6 (six) AM. brain essense     pantoprazole (PROTONIX) 40 MG tablet TAKE 1 TABLET BY MOUTH DAILY BEFORE BREAKFAST 90 tablet 1   ursodiol (ACTIGALL) 300 MG capsule Take 1 capsule (300 mg total) by mouth 3 (three) times daily. 270 capsule 3   No current facility-administered medications for this visit.    PAST MEDICAL HISTORY: Past Medical History:  Diagnosis Date   Absolute anemia 07/28/2018   Active smoker    Allergy    Anxiety    Takes Xanax for anxiety   Arthritis    Asthma    Cataract    Chronic airway obstruction, not elsewhere classified    Clotting disorder ([place])    stoke    Depression    Diarrhea    Diverticulosis of colon (without mention of hemorrhage)    Esophageal reflux    Family history of malignant neoplasm of gastrointestinal tract    GI bleed    Headache(784.0)    Irregular   Neuromuscular disorder (HCC)    numbness is left hand and left elbow   Obesity, unspecified    Other and unspecified hyperlipidemia    Personal history of colonic polyps    Stricture and stenosis of esophagus    Stroke ([place])    Thyroid disease    Type II or unspecified type diabetes mellitus without mention of complication, not stated as uncontrolled    Unspecified asthma(493.90)    Unspecified essential hypertension     PAST SURGICAL HISTORY: Past Surgical History:  Procedure Laterality Date   APPENDECTOMY     BACK SURGERY     Spinal surgery   BRAIN SURGERY     2013 - stoke ( fall in 2010)    CARDIAC CATHETERIZATION  2006   LAD: 30%, RCA : 20%, normal EF, elevated  LVEDP   CARDIAC CATHETERIZATION     CEREBRAL ANGIOGRAM  12/20/2015   COLONOSCOPY N/A 08/09/2018   Procedure: COLONOSCOPY;  Surgeon: Rogene Houston, MD;  [facility];  Service: Endoscopy;  Laterality: N/A;  1:55   ESOPHAGEAL DILATION N/A 08/09/2018   Procedure: ESOPHAGEAL DILATION;  Surgeon: Rogene Houston, MD;  [facility];  Service: Endoscopy;  Laterality: N/A;   ESOPHAGOGASTRODUODENOSCOPY N/A 08/09/2018   Procedure: ESOPHAGOGASTRODUODENOSCOPY (EGD);  Surgeon: Rogene Houston, MD;  [facility];  Service: Endoscopy;  Laterality: N/A;   GIVENS CAPSULE STUDY N/A 08/25/2018   Procedure: GIVENS CAPSULE STUDY;  Surgeon: Rogene Houston, MD;  [facility];  Service: Endoscopy;  Laterality: N/A;   IR ANGIO INTRA EXTRACRAN SEL COM CAROTID INNOMINATE BILAT MOD SED  01/14/2018   IR ANGIO INTRA EXTRACRAN SEL COM CAROTID INNOMINATE BILAT MOD SED  05/11/2018   IR ANGIO VERTEBRAL SEL SUBCLAVIAN INNOMINATE UNI L MOD SED  01/14/2018   IR ANGIO VERTEBRAL SEL SUBCLAVIAN INNOMINATE UNI L MOD SED  05/11/2018   IR ANGIO VERTEBRAL SEL VERTEBRAL UNI R MOD SED  01/14/2018   IR ANGIO VERTEBRAL SEL VERTEBRAL UNI R MOD SED  05/11/2018   KNEE ARTHROSCOPY     left   LUMBAR DISC SURGERY     POLYPECTOMY  08/09/2018   Procedure: POLYPECTOMY;  Surgeon: Rogene Houston, MD;  [facility];  Service: Endoscopy;;  ascending colon (CS x 2, HS x5) hepatic flexure (HSx1), recto-sigmoid (HSx1)   TONSILLECTOMY     TOTAL ABDOMINAL HYSTERECTOMY     menopause/bleeding    FAMILY HISTORY: Family History  Problem Relation Age of Onset   Diabetes Mother    Heart disease Mother    Asthma Mother    Kidney disease Mother    Drug abuse Mother    Hypertension Mother    Stroke Mother        fall -several bleed on brain 34   Early death Father        trauma   Colon cancer Other        first cousin, paternal aunt and uncle   Cancer Paternal Grandmother        gastric   Diabetes  Paternal Grandmother    Mental illness Paternal Grandmother    Heart disease Maternal Grandmother    Thyroid cancer Other        aunt   Heart disease Sister    Mental illness Sister    Heart attack Sister    Heart disease Brother    Lung disease Brother    Cancer Sister        pancreatic   Diabetes Sister    Hypertension Sister    Tuberculosis Maternal Grandfather    Heart disease Paternal Grandfather     SOCIAL HISTORY: Social History   Socioeconomic History   Marital status: Married    Spouse name: Elizabeth [name]   Number of children: 0   Years of education: 16   Highest education level: Bachelor's degree (e.g., BA, AB, BS)  Occupational History   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   Tobacco Use   Smoking status: Former    Packs/day: 1.00    Years: 20.00    Pack years: 20.00    Types: Cigarettes    Start date: 01/15/1983    Quit date: 11/09/2020    Years since quitting: 1.2   Smokeless tobacco: Never  Vaping Use   Vaping Use: Some days   Substances: THC, CBD, Flavoring, Nicotine-salt, Synthetic cannabinoids, Mixture of cannabinoids, Homemade substance  Substance and Sexual Activity   Alcohol use: No    Alcohol/week: 0.0 standard drinks   Drug use: No   Sexual activity: Not Currently  Other Topics Concern   Not on [name]  Social History Narrative   Married Elizabeth [name]  Limited activity due to back pain   Social Determinants of Health   Financial Resource Strain: Not on [name]  Food Insecurity: Not on [name]  Transportation Needs: Not on [name]  Physical Activity: Not on [name]  Stress: Not on [name]  Social Connections: Not on [name]  Intimate Partner Violence: Not on [name]      Marcial Pacas, M.D. Ph.D.  [facility] [address] [place], [place] [address] Ph: [phone] Fax: [phone]  CC:  Barbie Banner, PA-C [address] [place],  [place] [address]  Elizabeth Rival, FNP

## 2022-02-05 DIAGNOSIS — I69322 Dysarthria following cerebral infarction: Secondary | ICD-10-CM | POA: Diagnosis not present

## 2022-02-05 DIAGNOSIS — K219 Gastro-esophageal reflux disease without esophagitis: Secondary | ICD-10-CM | POA: Diagnosis not present

## 2022-02-05 DIAGNOSIS — I10 Essential (primary) hypertension: Secondary | ICD-10-CM | POA: Diagnosis not present

## 2022-02-05 DIAGNOSIS — E119 Type 2 diabetes mellitus without complications: Secondary | ICD-10-CM | POA: Diagnosis not present

## 2022-02-05 DIAGNOSIS — I69354 Hemiplegia and hemiparesis following cerebral infarction affecting left non-dominant side: Secondary | ICD-10-CM | POA: Diagnosis not present

## 2022-02-05 DIAGNOSIS — Z7902 Long term (current) use of antithrombotics/antiplatelets: Secondary | ICD-10-CM | POA: Diagnosis not present

## 2022-02-05 DIAGNOSIS — J449 Chronic obstructive pulmonary disease, unspecified: Secondary | ICD-10-CM | POA: Diagnosis not present

## 2022-02-05 DIAGNOSIS — E785 Hyperlipidemia, unspecified: Secondary | ICD-10-CM | POA: Diagnosis not present

## 2022-02-05 DIAGNOSIS — R1310 Dysphagia, unspecified: Secondary | ICD-10-CM | POA: Diagnosis not present

## 2022-02-05 DIAGNOSIS — Z9181 History of falling: Secondary | ICD-10-CM | POA: Diagnosis not present

## 2022-02-07 ENCOUNTER — Encounter: Payer: Self-pay | Admitting: Nurse Practitioner

## 2022-02-07 ENCOUNTER — Ambulatory Visit (INDEPENDENT_AMBULATORY_CARE_PROVIDER_SITE_OTHER): Payer: Medicare Other | Admitting: Nurse Practitioner

## 2022-02-07 VITALS — BP 123/70 | HR 91 | Ht 62.0 in | Wt 135.0 lb

## 2022-02-07 DIAGNOSIS — K117 Disturbances of salivary secretion: Secondary | ICD-10-CM | POA: Diagnosis not present

## 2022-02-07 DIAGNOSIS — E1142 Type 2 diabetes mellitus with diabetic polyneuropathy: Secondary | ICD-10-CM

## 2022-02-07 DIAGNOSIS — R42 Dizziness and giddiness: Secondary | ICD-10-CM

## 2022-02-07 DIAGNOSIS — R2681 Unsteadiness on feet: Secondary | ICD-10-CM

## 2022-02-07 DIAGNOSIS — E1169 Type 2 diabetes mellitus with other specified complication: Secondary | ICD-10-CM

## 2022-02-07 DIAGNOSIS — E1159 Type 2 diabetes mellitus with other circulatory complications: Secondary | ICD-10-CM

## 2022-02-07 DIAGNOSIS — I152 Hypertension secondary to endocrine disorders: Secondary | ICD-10-CM

## 2022-02-07 DIAGNOSIS — E785 Hyperlipidemia, unspecified: Secondary | ICD-10-CM

## 2022-02-07 MED ORDER — MECLIZINE HCL 12.5 MG PO TABS: 12.5000 mg | ORAL_TABLET | Freq: Three times a day (TID) | ORAL | 0 refills | Status: DC | PRN

## 2022-02-07 NOTE — Progress Notes (Signed)
   Elizabeth [name]     MRN: [id number]      DOB: 13-Dec-1940   HPI Elizabeth [name] with past medical history of hypertension, CVA, COPD, type 2 diabetes, gait instability, dizziness is here for follow up and re-evaluation of chronic medical conditions, medication management   Patient complain of drooling from the mouth, mild swallowing difficulties since she had her last stroke currently doing speech therapist  and PT at home.   Patient stated that her dizziness is much improved on meclizine 12.5 mg as needed, she continues on physical therapy at home.  Need to avoid falls discussed with patient today.   Patient denies adverse reactions to current medications  ROS Denies recent fever or chills. Denies sinus pressure, nasal congestion, ear pain or sore throat. Denies chest congestion, productive cough or wheezing. Denies chest pains, palpitations and leg swelling Denies abdominal pain, nausea, vomiting,diarrhea or constipation.   Denies dysuria, frequency, hesitancy or incontinence. Denies depression, anxiety or insomnia.    PE  BP 123/70 (BP Location: Right Arm, Patient Position: Sitting, Cuff Size: Normal)   Pulse 91   Ht '5\' 2"'$  (1.575 m)   Wt 135 lb (61.2 kg)   SpO2 95%   BMI 24.69 kg/m   Patient alert and oriented and in no cardiopulmonary distress.  Chest: Clear to auscultation bilaterally.  CVS: S1, S2 no murmurs, no S3.Regular rate.  ABD: Soft non tender.   Ext: No edema  MS: Patient sitting in the chair today, has left arm weakness, power 4/5 on right arm bilateral lower extremities  Psych: Good eye contact, normal affect. Memory intact not anxious or depressed appearing.    Assessment & Plan Hypertension associated with diabetes ([place]) BP Readings from Last 3 Encounters:  02/07/22 123/70  01/30/22 139/82  12/31/21 119/66  Condition well-controlled on amlodipine 10 mg daily, Continue current medication DASH diet advised, patient encouraged to continue to exercise  daily as tolerated. Follow-up in 4 months  Type 2 diabetes mellitus with peripheral neuropathy Chronic condition well-controlled Continue Jardiance 10 mg daily Diabetic eye exam scheduled today Not on statin due to statin intolerance, has upcoming appointments with cardiology, refused medical therapy for hyperlipidemia  Hyperlipidemia associated with type 2 diabetes mellitus ([place]) Lab Results  Component Value Date   CHOL 232 (H) 08/26/2021   HDL 58 08/26/2021   LDLCALC 156 (H) 08/26/2021   TRIG 101 08/26/2021   CHOLHDL 4.0 08/26/2021  Still refusing treatment for hyperlipidemia Risk of having recurrent stroke discussed with patient she verbalized understanding Has upcoming appointment with cardiology, patient encouraged to discuss her hyperlipidemia with cardiology at upcoming appointment if she would be willing to start Repatha injection  Dizziness Much improved on meclizine 12.5 mg as needed Medication refilled today I discussed with patient that her dizziness should get better with ongoing therapy.  Side effects of medication including tiredness, weakness, confusion discussed with patient  Drooling She has been experiencing drooling since her last stroke Currently undergoing physical therapy, speech therapy at home Swallow study was ordered by neurology, test is pending  Gait instability Continue physical therapy Need to prevent fall discussed with patient and her spouse Patient encouraged to use a walker daily keep the house free of clusters, continue to exercise daily as tolerated

## 2022-02-07 NOTE — Assessment & Plan Note (Signed)
BP Readings from Last 3 Encounters:  02/07/22 123/70  01/30/22 139/82  12/31/21 119/66  Condition well-controlled on amlodipine 10 mg daily, Continue current medication DASH diet advised, patient encouraged to continue to exercise daily as tolerated. Follow-up in 4 months

## 2022-02-07 NOTE — Assessment & Plan Note (Signed)
Continue physical therapy Need to prevent fall discussed with patient and her spouse Patient encouraged to use a walker daily keep the house free of clusters, continue to exercise daily as tolerated

## 2022-02-07 NOTE — Assessment & Plan Note (Signed)
Chronic condition well-controlled Continue Jardiance 10 mg daily Diabetic eye exam scheduled today Not on statin due to statin intolerance, has upcoming appointments with cardiology, refused medical therapy for hyperlipidemia

## 2022-02-07 NOTE — Assessment & Plan Note (Addendum)
Much improved on meclizine 12.5 mg as needed Medication refilled today I discussed with patient that her dizziness should get better with ongoing therapy.  Side effects of medication including tiredness, weakness, confusion discussed with patient

## 2022-02-07 NOTE — Assessment & Plan Note (Addendum)
Lab Results  Component Value Date   CHOL 232 (H) 08/26/2021   HDL 58 08/26/2021   LDLCALC 156 (H) 08/26/2021   TRIG 101 08/26/2021   CHOLHDL 4.0 08/26/2021  Still refusing treatment for hyperlipidemia Risk of having recurrent stroke discussed with patient she verbalized understanding Has upcoming appointment with cardiology, patient encouraged to discuss her hyperlipidemia with cardiology at upcoming appointment if she would be willing to start Repatha injection

## 2022-02-07 NOTE — Assessment & Plan Note (Signed)
She has been experiencing drooling since her last stroke Currently undergoing physical therapy, speech therapy at home Swallow study was ordered by neurology, test is pending

## 2022-02-07 NOTE — Patient Instructions (Signed)
Please get your shingles vaccine and TDAP vaccine, COVID vaccines at your pharmacy.   Please schedule your diabetic  eye exam today     It is important that you exercise regularly at least 30 minutes 5 times a week.  Think about what you will eat, plan ahead. Choose " clean, green, fresh or frozen" over canned, processed or packaged foods which are more sugary, salty and fatty. 70 to 75% of food eaten should be vegetables and fruit. Three meals at set times with snacks allowed between meals, but they must be fruit or vegetables. Aim to eat over a 12 hour period , example 7 am to 7 pm, and STOP after  your last meal of the day. Drink water,generally about 64 ounces per day, no other drink is as healthy. Fruit juice is best enjoyed in a healthy way, by EATING the fruit.  Thanks for choosing [facility], we consider it a privelige to serve you.

## 2022-02-10 ENCOUNTER — Ambulatory Visit (INDEPENDENT_AMBULATORY_CARE_PROVIDER_SITE_OTHER): Payer: Medicare Other

## 2022-02-10 DIAGNOSIS — Z0000 Encounter for general adult medical examination without abnormal findings: Secondary | ICD-10-CM

## 2022-02-10 DIAGNOSIS — I6381 Other cerebral infarction due to occlusion or stenosis of small artery: Secondary | ICD-10-CM | POA: Diagnosis not present

## 2022-02-10 NOTE — Progress Notes (Signed)
Subjective:   Elizabeth [name] is a 81 y.o. female who presents for Medicare Annual (Subsequent) preventive examination. I connected with  Elizabeth [name] on 02/10/22 by a audio enabled telemedicine application and verified that I am speaking with the correct person using two identifiers.  Patient Location: Home  Provider Location: Office/Clinic  I discussed the limitations of evaluation and management by telemedicine. The patient expressed understanding and agreed to proceed.  Review of Systems           Objective:    There were no vitals filed for this visit. There is no height or weight on file to calculate BMI.     11/19/2021    1:21 PM 02/20/2021    4:22 PM 01/28/2021    3:51 PM 11/26/2020    2:57 PM 11/19/2020    3:02 PM 11/13/2020    8:28 AM 02/03/2019   10:12 AM  Advanced Directives  Does Patient Have a Medical Advance Directive? Yes Yes Yes Yes Yes Yes Yes  Type of Advance Directive Living will;Healthcare Power of [place];Living will [place];Living will [place];Living will [place];Living will Living will;Healthcare Power of [place];Living will  Does patient want to make changes to medical advance directive? No - Patient declined No - Patient declined No - Patient declined  No - Patient declined No - Patient declined   Copy of [place] in Chart?  No - copy requested No - copy requested No - copy requested No - copy requested No - copy requested   Would patient like information on creating a medical advance directive?       No - Patient declined    Current Medications (verified) Outpatient Encounter Medications as of 02/10/2022  Medication Sig   albuterol (VENTOLIN HFA) 108 (90 Base) MCG/ACT inhaler INHALE 2 PUFFS EVERY SIX HOURS AS NEEDED FOR WHEEZING OR SHORTNESS OF BREATH   amLODipine (NORVASC) 10 MG tablet TAKE 1 TABLET BY MOUTH EVERY DAY  (Patient taking differently: Take 10 mg by mouth daily.)   azelastine (ASTELIN) 0.1 % nasal spray Place 2 sprays into both nostrils 2 (two) times daily. Use in each nostril as directed   clopidogrel (PLAVIX) 75 MG tablet TAKE ONE TABLET BY MOUTH DAILY (Patient taking differently: Take 75 mg by mouth daily.)   empagliflozin (JARDIANCE) 10 MG TABS tablet Take 1 tablet (10 mg total) by mouth daily before breakfast.   Grape Seed OIL by Does not apply route daily at 6 (six) AM.   meclizine (ANTIVERT) 12.5 MG tablet Take 1 tablet (12.5 mg total) by mouth 3 (three) times daily as needed for dizziness.   methocarbamol (ROBAXIN) 500 MG tablet Take 1 tablet (500 mg total) by mouth every 6 (six) hours as needed for muscle spasms.   montelukast (SINGULAIR) 10 MG tablet Take 1 tablet (10 mg total) by mouth at bedtime.   OVER THE COUNTER MEDICATION daily at 6 (six) AM. brain essense   pantoprazole (PROTONIX) 40 MG tablet TAKE 1 TABLET BY MOUTH DAILY BEFORE BREAKFAST   ursodiol (ACTIGALL) 300 MG capsule Take 1 capsule (300 mg total) by mouth 3 (three) times daily.   [DISCONTINUED] ramipril (ALTACE) 10 MG capsule Take 10 mg by mouth daily.     No facility-administered encounter medications on file as of 02/10/2022.    Allergies (verified) Banana, Codeine, Stevia [stevioside], Actos [pioglitazone], Banana flavor, Benicar [olmesartan medoxomil], Chantix [varenicline], Diovan [  valsartan], Lipitor [atorvastatin], Rice, Spinach, Statins, Vicodin [hydrocodone-acetaminophen], Zocor [simvastatin], Ace inhibitors, Celebrex [celecoxib], Latex, Metformin and related, Mushroom extract complex, Penicillins, and Sulfonamide derivatives   History: Past Medical History:  Diagnosis Date   Absolute anemia 07/28/2018   Active smoker    Allergy    Anxiety    Takes Xanax for anxiety   Arthritis    Asthma    Cataract    Chronic airway obstruction, not elsewhere classified    Clotting disorder ([place])    stoke    Depression     Diarrhea    Diverticulosis of colon (without mention of hemorrhage)    Esophageal reflux    Family history of malignant neoplasm of gastrointestinal tract    GI bleed    Headache(784.0)    Irregular   Neuromuscular disorder (HCC)    numbness is left hand and left elbow   Obesity, unspecified    Other and unspecified hyperlipidemia    Personal history of colonic polyps    Stricture and stenosis of esophagus    Stroke ([place])    Thyroid disease    Type II or unspecified type diabetes mellitus without mention of complication, not stated as uncontrolled    Unspecified asthma(493.90)    Unspecified essential hypertension    Past Surgical History:  Procedure Laterality Date   APPENDECTOMY     BACK SURGERY     Spinal surgery   BRAIN SURGERY     2013 - stoke ( fall in 2010)    CARDIAC CATHETERIZATION  2006   LAD: 30%, RCA : 20%, normal EF, elevated LVEDP   CARDIAC CATHETERIZATION     CEREBRAL ANGIOGRAM  12/20/2015   COLONOSCOPY N/A 08/09/2018   Procedure: COLONOSCOPY;  Surgeon: Rogene Houston, MD;  [facility];  Service: Endoscopy;  Laterality: N/A;  1:55   ESOPHAGEAL DILATION N/A 08/09/2018   Procedure: ESOPHAGEAL DILATION;  Surgeon: Rogene Houston, MD;  [facility];  Service: Endoscopy;  Laterality: N/A;   ESOPHAGOGASTRODUODENOSCOPY N/A 08/09/2018   Procedure: ESOPHAGOGASTRODUODENOSCOPY (EGD);  Surgeon: Rogene Houston, MD;  [facility];  Service: Endoscopy;  Laterality: N/A;   GIVENS CAPSULE STUDY N/A 08/25/2018   Procedure: GIVENS CAPSULE STUDY;  Surgeon: Rogene Houston, MD;  [facility];  Service: Endoscopy;  Laterality: N/A;   IR ANGIO INTRA EXTRACRAN SEL COM CAROTID INNOMINATE BILAT MOD SED  01/14/2018   IR ANGIO INTRA EXTRACRAN SEL COM CAROTID INNOMINATE BILAT MOD SED  05/11/2018   IR ANGIO VERTEBRAL SEL SUBCLAVIAN INNOMINATE UNI L MOD SED  01/14/2018   IR ANGIO VERTEBRAL SEL SUBCLAVIAN INNOMINATE UNI L MOD SED  05/11/2018   IR  ANGIO VERTEBRAL SEL VERTEBRAL UNI R MOD SED  01/14/2018   IR ANGIO VERTEBRAL SEL VERTEBRAL UNI R MOD SED  05/11/2018   KNEE ARTHROSCOPY     left   LUMBAR DISC SURGERY     POLYPECTOMY  08/09/2018   Procedure: POLYPECTOMY;  Surgeon: Rogene Houston, MD;  [facility];  Service: Endoscopy;;  ascending colon (CS x 2, HS x5) hepatic flexure (HSx1), recto-sigmoid (HSx1)   TONSILLECTOMY     TOTAL ABDOMINAL HYSTERECTOMY     menopause/bleeding   Family History  Problem Relation Age of Onset   Diabetes Mother    Heart disease Mother    Asthma Mother    Kidney disease Mother    Drug abuse Mother    Hypertension Mother    Stroke Mother  fall -several bleed on brain 1990   Early death Father        trauma   Colon cancer Other        first cousin, paternal aunt and uncle   Cancer Paternal Grandmother        gastric   Diabetes Paternal Grandmother    Mental illness Paternal Grandmother    Heart disease Maternal Grandmother    Thyroid cancer Other        aunt   Heart disease Sister    Mental illness Sister    Heart attack Sister    Heart disease Brother    Lung disease Brother    Cancer Sister        pancreatic   Diabetes Sister    Hypertension Sister    Tuberculosis Maternal Grandfather    Heart disease Paternal Grandfather    Social History   Socioeconomic History   Marital status: Married    Spouse name: [name]   Number of children: 0   Years of education: 16   Highest education level: Bachelor's degree (e.g., BA, AB, BS)  Occupational History   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   Tobacco Use   Smoking status: Former    Packs/day: 1.00    Years: 20.00    Pack years: 20.00    Types: Cigarettes    Start date: 01/15/1983    Quit date: 11/09/2020    Years since quitting: 1.2   Smokeless tobacco: Never  Vaping Use   Vaping Use: Some days   Substances: THC, CBD, Flavoring, Nicotine-salt, Synthetic cannabinoids, Mixture of cannabinoids,  Homemade substance  Substance and Sexual Activity   Alcohol use: No    Alcohol/week: 0.0 standard drinks   Drug use: No   Sexual activity: Not Currently  Other Topics Concern   Not on file  Social History Narrative   Married [name]   Limited activity due to back pain   Social Determinants of Radio broadcast assistant Strain: Not on file  Food Insecurity: Not on file  Transportation Needs: Not on file  Physical Activity: Not on file  Stress: Not on file  Social Connections: Not on file    Tobacco Counseling Counseling given: Not Answered   Clinical Intake:                 Diabetic?yes  Nutrition Risk Assessment:  Has the patient had any N/V/D within the last 2 months?  No  Does the patient have any non-healing wounds?  No  Has the patient had any unintentional weight loss or weight gain?  No   Diabetes:  Is the patient diabetic?  Yes  If diabetic, was a CBG obtained today?  No  Did the patient bring in their glucometer from home?  No  How often do you monitor your CBG's? 4 times a day.   Financial Strains and Diabetes Management:  Are you having any financial strains with the device, your supplies or your medication? No .  Does the patient want to be seen by Chronic Care Management for management of their diabetes?  No  Would the patient like to be referred to a Nutritionist or for Diabetic Management?  No   Diabetic Exams:  Diabetic Eye Exam: Overdue for diabetic eye exam. Pt has been advised about the importance in completing this exam. Patient advised to call and schedule an eye exam. Diabetic Foot Exam: Completed 12/27/21  Activities of Daily Living    11/19/2021    1:27 PM 11/19/2021    1:25 PM  In your present state of health, do you have any difficulty performing the following activities:  Hearing?  0  Vision?  0  Difficulty concentrating or making decisions?  0  Walking or climbing stairs?  1  Dressing or bathing?  1   Doing errands, shopping? 1     Patient Care Team: Renee Rival, FNP as PCP - General (Nurse Practitioner) Particia Nearing, [place] (Optometry) Rogene Houston, MD as Consulting Physician (Gastroenterology) Luanne Bras, MD as Consulting Physician (Interventional Radiology) Kassie Mends, RN as [place] any recent Medical Services you may have received from other than [facility] providers in the past year (date may be approximate).     Assessment:   This is a routine wellness examination for [facility].  Hearing/Vision screen No results found.  Dietary issues and exercise activities discussed:     Goals Addressed   None    Depression Screen    02/07/2022   10:12 AM 01/29/2022   10:37 AM 12/31/2021    1:56 PM 12/27/2021    1:27 PM 08/28/2021    9:42 AM 08/09/2021    8:10 AM 02/05/2021    2:21 PM  PHQ 2/9 Scores  PHQ - 2 Score '6 1 6 6 '$ 0 0 0  PHQ- 9 Score '7  22    1    '$ Fall Risk    02/07/2022   10:12 AM 01/29/2022   10:37 AM 12/31/2021    1:54 PM 12/27/2021    1:27 PM 08/28/2021    9:42 AM  Fall Risk   Falls in the past year? 0 0 1 1 0  Comment   Last fall 12/31/2021. No major injury.    Number falls in past yr: 0 0 1 1 0  Injury with Fall? 0 0 0 0 0  Risk for fall due to : No Fall Risks No Fall Risks  History of fall(s) No Fall Risks  Follow up Falls evaluation completed Falls evaluation completed  Falls evaluation completed Falls evaluation completed    [place]:  Any stairs in or around the home? Yes  If so, are there any without handrails? No  Home free of loose throw rugs in walkways, pet beds, electrical cords, etc? Yes  Adequate lighting in your home to reduce risk of falls? Yes   ASSISTIVE DEVICES UTILIZED TO PREVENT FALLS:  Life alert? No  Use of a cane, walker or w/c? Yes  Grab bars in the bathroom? Yes  Shower chair or bench in shower? Yes  Elevated toilet seat or a  handicapped toilet? Yes   TIMED UP AND GO:  Was the test performed? No .  Length of time to ambulate 10 feet:  sec.     Cognitive Function:        02/03/2019   10:18 AM  6CIT Screen  What Year? 0 points  What month? 0 points  What time? 0 points  Count back from 20 0 points  Months in reverse 0 points  Repeat phrase 0 points  Total Score 0 points    Immunizations Immunization History  Administered Date(s) Administered   Influenza Whole 06/08/2010   Influenza, High Dose Seasonal PF 06/17/2019   Influenza,inj,Quad PF,6+ Mos 07/07/2013, 07/06/2014, 07/17/2017   Influenza-Unspecified 08/05/2016   Moderna Covid-19 Vaccine Bivalent Booster 9yr & up 06/06/2021  Moderna SARS-COV2 Booster Vaccination 07/24/2020   Moderna Sars-Covid-2 Vaccination 10/13/2019, 11/11/2019   Pneumococcal Conjugate-13 11/25/2013   Pneumococcal Polysaccharide-23 09/09/2007, 07/06/2014    TDAP status: Due, Education has been provided regarding the importance of this vaccine. Advised may receive this vaccine at local pharmacy or Health Dept. Aware to provide a copy of the vaccination record if obtained from local pharmacy or Health Dept. Verbalized acceptance and understanding.  Flu Vaccine status: Up to date  Pneumococcal vaccine status: Up to date  Covid-19 vaccine status: Information provided on how to obtain vaccines.   Qualifies for Shingles Vaccine? Yes   Zostavax completed No   Shingrix Completed?: No.    Education has been provided regarding the importance of this vaccine. Patient has been advised to call insurance company to determine out of pocket expense if they have not yet received this vaccine. Advised may also receive vaccine at local pharmacy or Health Dept. Verbalized acceptance and understanding.  Screening Tests Health Maintenance  Topic Date Due   Zoster Vaccines- Shingrix (1 of 2) Never done   OPHTHALMOLOGY EXAM  05/05/2015   URINE MICROALBUMIN  10/02/2020   TETANUS/TDAP   01/06/2021   COVID-19 Vaccine (3 - Moderna risk series) 06/06/2021   HEMOGLOBIN A1C  02/24/2022   INFLUENZA VACCINE  04/08/2022   FOOT EXAM  12/28/2022   Pneumonia Vaccine 75+ Years old  Completed   DEXA SCAN  Completed   Hepatitis C Screening  Completed   HPV VACCINES  Aged Out    Health Maintenance  Health Maintenance Due  Topic Date Due   Zoster Vaccines- Shingrix (1 of 2) Never done   OPHTHALMOLOGY EXAM  05/05/2015   URINE MICROALBUMIN  10/02/2020   TETANUS/TDAP  01/06/2021   COVID-19 Vaccine (3 - Moderna risk series) 06/06/2021    Colorectal cancer screening: No longer required.   Mammogram status: No longer required due to age.  Bone Density status: Completed 12/09/17. Results reflect: Bone density results: OSTEOPENIA. Repeat every 2 years.  Lung Cancer Screening: (Low Dose CT Chest recommended if Age 81-80 years, 30 pack-year currently smoking OR have quit w/in 15years.) does not qualify.   Lung Cancer Screening Referral:   Additional Screening:  Hepatitis C Screening: does not qualify; Completed 01/20/19  Vision Screening: Recommended annual ophthalmology exams for early detection of glaucoma and other disorders of the eye. Is the patient up to date with their annual eye exam?  No  Who is the provider or what is the name of the office in which the patient attends annual eye exams? Dr Herbert Deaner If pt is not established with a provider, would they like to be referred to a provider to establish care? No .   Dental Screening: Recommended annual dental exams for proper oral hygiene  Community Resource Referral / Chronic Care Management: CRR required this visit?  No   CCM required this visit?  No      Plan:     I have personally reviewed and noted the following in the patient's chart:   Medical and social history Use of alcohol, tobacco or illicit drugs  Current medications and supplements including opioid prescriptions.  Functional ability and  status Nutritional status Physical activity Advanced directives List of other physicians Hospitalizations, surgeries, and ER visits in previous 12 months Vitals Screenings to include cognitive, depression, and falls Referrals and appointments  In addition, I have reviewed and discussed with patient certain preventive protocols, quality metrics, and best practice recommendations. A written personalized care plan for  preventive services as well as general preventive health recommendations were provided to patient.     Jill Side, [place]   02/10/2022   Nurse Notes:

## 2022-02-10 NOTE — Patient Instructions (Signed)
  Elizabeth [name] , Thank you for taking time to come for your Medicare Wellness Visit. I appreciate your ongoing commitment to your health goals. Please review the following plan we discussed and let me know if I can assist you in the future.   These are the goals we discussed:  Goals      HEMOGLOBIN A1C < 7     Medication Management     Patient Goals/Self-Care Activities Over the next 90 days, patient will:  Check glucose at least once daily, document, and provide at future appointments Check blood pressure at least once daily, document, and provide at future appointments Collaborate with provider on medication access solutions             Patient Stated     Patient states that her goal is to be able to walk to her car and get in it and drive. Patient states that she wants to get back to normal.     Prevent falls     Use cane more - stay active       Quit smoking / using tobacco     stop smoking        This is a list of the screening recommended for you and due dates:  Health Maintenance  Topic Date Due   Zoster (Shingles) Vaccine (1 of 2) Never done   Eye exam for diabetics  05/05/2015   Urine Protein Check  10/02/2020   Tetanus Vaccine  01/06/2021   COVID-19 Vaccine (3 - Moderna risk series) 06/06/2021   Hemoglobin A1C  02/24/2022   Flu Shot  04/08/2022   Complete foot exam   12/28/2022   Pneumonia Vaccine  Completed   DEXA scan (bone density measurement)  Completed   Hepatitis C Screening: USPSTF Recommendation to screen - Ages 25-79 yo.  Completed   HPV Vaccine  Aged Out

## 2022-02-12 ENCOUNTER — Telehealth: Payer: Self-pay

## 2022-02-12 DIAGNOSIS — Z9181 History of falling: Secondary | ICD-10-CM | POA: Diagnosis not present

## 2022-02-12 DIAGNOSIS — Z7902 Long term (current) use of antithrombotics/antiplatelets: Secondary | ICD-10-CM | POA: Diagnosis not present

## 2022-02-12 DIAGNOSIS — I10 Essential (primary) hypertension: Secondary | ICD-10-CM | POA: Diagnosis not present

## 2022-02-12 DIAGNOSIS — R1310 Dysphagia, unspecified: Secondary | ICD-10-CM | POA: Diagnosis not present

## 2022-02-12 DIAGNOSIS — I69322 Dysarthria following cerebral infarction: Secondary | ICD-10-CM | POA: Diagnosis not present

## 2022-02-12 DIAGNOSIS — I69354 Hemiplegia and hemiparesis following cerebral infarction affecting left non-dominant side: Secondary | ICD-10-CM | POA: Diagnosis not present

## 2022-02-12 DIAGNOSIS — J449 Chronic obstructive pulmonary disease, unspecified: Secondary | ICD-10-CM | POA: Diagnosis not present

## 2022-02-12 DIAGNOSIS — E785 Hyperlipidemia, unspecified: Secondary | ICD-10-CM | POA: Diagnosis not present

## 2022-02-12 DIAGNOSIS — K219 Gastro-esophageal reflux disease without esophagitis: Secondary | ICD-10-CM | POA: Diagnosis not present

## 2022-02-12 DIAGNOSIS — E119 Type 2 diabetes mellitus without complications: Secondary | ICD-10-CM | POA: Diagnosis not present

## 2022-02-12 NOTE — Telephone Encounter (Signed)
Elizabeth [name] called back to report that she started vomiting after her PT session. Husband said that it happens often. I asked Elizabeth [name] about the fall and asked if she hit her head. Elizabeth [name] states no she didn't hit her head. She has standing and leaning on the back of the couch and just went down to her knees. Pt states her legs and hands gave out on her. Elizabeth [name] advised her to use her walker. Elizabeth [name] noticed that she was coughing. Husband said she been doing that every since coming back from hospital and that she sounded like she had drainage.  I tried calling patient and husband but no answer. I left a message.

## 2022-02-12 NOTE — Telephone Encounter (Signed)
Elizabeth [name], PT called to report a fall with the patient. She stated she watched her fall as she was trying to sit in the chair. She and the patient's husband assisted her up to the chair. Patient has no injuries and did not hit any body parts. She is scheduled to see Dr. Letta Pate 02/13/22

## 2022-02-12 NOTE — Telephone Encounter (Signed)
Husband called back stating pt is ok, didn't hit head and she vomited because she hadn't ate anything. Patient has appt 02/13/22.

## 2022-02-13 ENCOUNTER — Encounter: Payer: Medicare Other | Attending: Registered Nurse | Admitting: Physical Medicine & Rehabilitation

## 2022-02-13 ENCOUNTER — Encounter: Payer: Self-pay | Admitting: Physical Medicine & Rehabilitation

## 2022-02-13 VITALS — BP 125/76 | HR 89 | Ht 62.0 in | Wt 134.0 lb

## 2022-02-13 DIAGNOSIS — R1310 Dysphagia, unspecified: Secondary | ICD-10-CM | POA: Diagnosis not present

## 2022-02-13 DIAGNOSIS — R1312 Dysphagia, oropharyngeal phase: Secondary | ICD-10-CM | POA: Insufficient documentation

## 2022-02-13 DIAGNOSIS — J449 Chronic obstructive pulmonary disease, unspecified: Secondary | ICD-10-CM | POA: Diagnosis not present

## 2022-02-13 DIAGNOSIS — I69354 Hemiplegia and hemiparesis following cerebral infarction affecting left non-dominant side: Secondary | ICD-10-CM | POA: Insufficient documentation

## 2022-02-13 DIAGNOSIS — Z9181 History of falling: Secondary | ICD-10-CM | POA: Diagnosis not present

## 2022-02-13 DIAGNOSIS — E785 Hyperlipidemia, unspecified: Secondary | ICD-10-CM | POA: Diagnosis not present

## 2022-02-13 DIAGNOSIS — E119 Type 2 diabetes mellitus without complications: Secondary | ICD-10-CM | POA: Diagnosis not present

## 2022-02-13 DIAGNOSIS — I10 Essential (primary) hypertension: Secondary | ICD-10-CM | POA: Diagnosis not present

## 2022-02-13 DIAGNOSIS — K219 Gastro-esophageal reflux disease without esophagitis: Secondary | ICD-10-CM | POA: Diagnosis not present

## 2022-02-13 DIAGNOSIS — Z7902 Long term (current) use of antithrombotics/antiplatelets: Secondary | ICD-10-CM | POA: Diagnosis not present

## 2022-02-13 DIAGNOSIS — I69322 Dysarthria following cerebral infarction: Secondary | ICD-10-CM | POA: Diagnosis not present

## 2022-02-13 NOTE — Patient Instructions (Signed)
Call if you need orders for outpt therapy

## 2022-02-13 NOTE — Progress Notes (Signed)
Subjective:    Patient ID: Elizabeth [name], female    DOB: Sep 17, 1940, 81 y.o.   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 Pain Inventory Average Pain 0 Pain Right Now 0 My pain is intermittent and aching  LOCATION OF PAIN  Lower Back  BOWEL Number of stools per week: 2 Oral laxative use No  Type of laxative NO Enema or suppository use No  History of colostomy No  Incontinent No   BLADDER Normal    Mobility use a cane use a walker ability to climb steps?  yes do you drive?  no use a wheelchair transfers alone Do you have any goals in this area?  yes  Function retired I need assistance with the following:  dressing, bathing, toileting, meal prep, household duties, and shopping Do you have any goals in this area?  yes  Neuro/Psych bowel control problems weakness trouble walking dizziness  Prior Studies Any changes since last visit?  no  Physicians involved in your care Any changes since last visit?  no   Family History  Problem Relation Age of Onset   Diabetes Mother    Heart disease Mother    Asthma Mother    Kidney disease Mother    Drug abuse Mother    Hypertension Mother    Stroke Mother        fall -several bleed on brain 29   Early death Father        trauma   Colon cancer Other        first cousin, paternal aunt and uncle   Cancer Paternal Grandmother        gastric   Diabetes Paternal Grandmother    Mental illness Paternal Grandmother    Heart disease Maternal Grandmother    Thyroid cancer Other        aunt   Heart disease Sister    Mental illness Sister    Heart attack Sister    Heart disease Brother    Lung disease Brother     Cancer Sister        pancreatic   Diabetes Sister    Hypertension Sister    Tuberculosis Maternal Grandfather    Heart disease Paternal Grandfather    Social History   Socioeconomic History   Marital status: Married    Spouse name: Elizabeth [name]   Number of children: 0   Years of education: 16   Highest education level: Bachelor's degree (e.g., BA, AB, BS)  Occupational History   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   Tobacco Use   Smoking status: Former    Packs/day: 1.00    Years: 20.00    Total pack years: 20.00    Types: Cigarettes    Start date: 01/15/1983    Quit date: 11/09/2020    Years since quitting: 1.2   Smokeless tobacco: Never  Vaping Use   Vaping Use: Some days   Substances: THC, CBD, Flavoring, Nicotine-salt, Synthetic cannabinoids, Mixture of cannabinoids, Homemade substance  Substance and Sexual Activity   Alcohol use: No    Alcohol/week: 0.0 standard drinks of alcohol  Drug use: No   Sexual activity: Not Currently  Other Topics Concern   Not on [name]  Social History Narrative   Married Elizabeth [name]   Limited activity due to back pain   Social Determinants of Health   Financial Resource Strain: Not on [name]  Food Insecurity: Not on [name]  Transportation Needs: Not on [name]  Physical Activity: Insufficiently Active (01/28/2021)   Exercise Vital Sign    Days of Exercise per Week: 3 days    Minutes of Exercise per Session: 20 min  Stress: Stress Concern Present (07/17/2017)   [place]    Feeling of Stress : To some extent  Social Connections: Moderately Isolated (01/28/2021)   Social Connection and Isolation Panel [NHANES]    Frequency of Communication with Friends and Family: More than three times a week    Frequency of Social Gatherings with Friends and Family: More than three times a week    Attends Religious Services: Never    Marine scientist or Organizations: No     Attends Music therapist: Never    Marital Status: Married   Past Surgical History:  Procedure Laterality Date   APPENDECTOMY     BACK SURGERY     Spinal surgery   BRAIN SURGERY     2013 - [place] ( fall in 2010)    CARDIAC CATHETERIZATION  2006   LAD: 30%, RCA : 20%, normal EF, elevated LVEDP   CARDIAC CATHETERIZATION     CEREBRAL ANGIOGRAM  12/20/2015   COLONOSCOPY N/A 08/09/2018   Procedure: COLONOSCOPY;  Surgeon: Rogene Houston, MD;  [facility];  Service: Endoscopy;  Laterality: N/A;  1:55   ESOPHAGEAL DILATION N/A 08/09/2018   Procedure: ESOPHAGEAL DILATION;  Surgeon: Rogene Houston, MD;  [facility];  Service: Endoscopy;  Laterality: N/A;   ESOPHAGOGASTRODUODENOSCOPY N/A 08/09/2018   Procedure: ESOPHAGOGASTRODUODENOSCOPY (EGD);  Surgeon: Rogene Houston, MD;  [facility];  Service: Endoscopy;  Laterality: N/A;   GIVENS CAPSULE STUDY N/A 08/25/2018   Procedure: GIVENS CAPSULE STUDY;  Surgeon: Rogene Houston, MD;  [facility];  Service: Endoscopy;  Laterality: N/A;   IR ANGIO INTRA EXTRACRAN SEL COM CAROTID INNOMINATE BILAT MOD SED  01/14/2018   IR ANGIO INTRA EXTRACRAN SEL COM CAROTID INNOMINATE BILAT MOD SED  05/11/2018   IR ANGIO VERTEBRAL SEL SUBCLAVIAN INNOMINATE UNI L MOD SED  01/14/2018   IR ANGIO VERTEBRAL SEL SUBCLAVIAN INNOMINATE UNI L MOD SED  05/11/2018   IR ANGIO VERTEBRAL SEL VERTEBRAL UNI R MOD SED  01/14/2018   IR ANGIO VERTEBRAL SEL VERTEBRAL UNI R MOD SED  05/11/2018   KNEE ARTHROSCOPY     left   LUMBAR DISC SURGERY     POLYPECTOMY  08/09/2018   Procedure: POLYPECTOMY;  Surgeon: Rogene Houston, MD;  [facility];  Service: Endoscopy;;  ascending colon (CS x 2, HS x5) hepatic flexure (HSx1), recto-sigmoid (HSx1)   TONSILLECTOMY     TOTAL ABDOMINAL HYSTERECTOMY     menopause/bleeding   Past Medical History:  Diagnosis Date   Absolute anemia 07/28/2018   Active smoker    Allergy    Anxiety     Takes Xanax for anxiety   Arthritis    Asthma    Cataract    Chronic airway obstruction, not elsewhere classified    Clotting disorder ([place])    stoke    Depression  Diarrhea    Diverticulosis of colon (without mention of hemorrhage)    Esophageal reflux    Family history of malignant neoplasm of gastrointestinal tract    GI bleed    Headache(784.0)    Irregular   Neuromuscular disorder (HCC)    numbness is left hand and left elbow   Obesity, unspecified    Other and unspecified hyperlipidemia    Personal history of colonic polyps    Stricture and stenosis of esophagus    Stroke [facility])    Thyroid disease    Type II or unspecified type diabetes mellitus without mention of complication, not stated as uncontrolled    Unspecified asthma(493.90)    Unspecified essential hypertension    There were no vitals taken for this visit.  Opioid Risk Score:   Fall Risk Score:  `1  Depression screen [facility] 2/9     02/10/2022    8:37 AM 02/07/2022   10:12 AM 01/29/2022   10:37 AM 12/31/2021    1:56 PM 12/27/2021    1:27 PM 08/28/2021    9:42 AM 08/09/2021    8:10 AM  Depression screen PHQ 2/9  Decreased Interest 2 3 0 3 3 0 0  Down, Depressed, Hopeless '2 3 1 3 3 '$ 0 0  PHQ - 2 Score '4 6 1 6 6 '$ 0 0  Altered sleeping 0 0  1     Tired, decreased energy 0 0 1 3 0    Change in appetite 0 0 0 3 3    Feeling bad or failure about yourself  '1 1 3 3 3    '$ Trouble concentrating 0 0 0 3 3    Moving slowly or fidgety/restless 0 0 0 3 0    Suicidal thoughts 0 0 0 0 0    PHQ-9 Score '5 7  22     '$ Difficult doing work/chores Somewhat difficult Somewhat difficult Extremely dIfficult  Somewhat difficult      Review of Systems  Gastrointestinal:  Positive for constipation.  Musculoskeletal:  Positive for back pain and gait problem.  Neurological:  Positive for weakness.  All other systems reviewed and are negative.      Objective:   Physical Exam Vitals and nursing note reviewed.  Constitutional:       Appearance: She is normal weight.  HENT:     Head: Normocephalic and atraumatic.  Eyes:     Extraocular Movements: Extraocular movements intact.     Conjunctiva/sclera: Conjunctivae normal.     Pupils: Pupils are equal, round, and reactive to light.  Skin:    General: Skin is warm and dry.  Neurological:     Mental Status: She is alert and oriented to person, place, and time.  Psychiatric:        Mood and Affect: Mood normal.        Behavior: Behavior normal.   Motor strength is 4 - at the left deltoid bicep tricep grip hip flexor knee extensor and 3 months ankle dorsiflexor. She has decreased fine motor control left upper extremity with finger thumb opposition.  In addition she has positive dysdiadochokinesis with rapid alternating supination pronation of the left upper extremity.  Speech without dysarthria or aphasia  No field cut or left neglect noted.        Assessment & Plan:   1.  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  Overall making good progress from a functional standpoint.  She may in the future should be a candidate  for vivi stim if she continues to exhibit poor motor control left upper extremity. Continue outpatient therapy Not ready to resume driving but may be a candidate in the future. Return to clinic in 6 weeks

## 2022-02-14 DIAGNOSIS — K219 Gastro-esophageal reflux disease without esophagitis: Secondary | ICD-10-CM | POA: Diagnosis not present

## 2022-02-14 DIAGNOSIS — I69354 Hemiplegia and hemiparesis following cerebral infarction affecting left non-dominant side: Secondary | ICD-10-CM | POA: Diagnosis not present

## 2022-02-14 DIAGNOSIS — Z9181 History of falling: Secondary | ICD-10-CM | POA: Diagnosis not present

## 2022-02-14 DIAGNOSIS — R1310 Dysphagia, unspecified: Secondary | ICD-10-CM | POA: Diagnosis not present

## 2022-02-14 DIAGNOSIS — J449 Chronic obstructive pulmonary disease, unspecified: Secondary | ICD-10-CM | POA: Diagnosis not present

## 2022-02-14 DIAGNOSIS — E119 Type 2 diabetes mellitus without complications: Secondary | ICD-10-CM | POA: Diagnosis not present

## 2022-02-14 DIAGNOSIS — Z7902 Long term (current) use of antithrombotics/antiplatelets: Secondary | ICD-10-CM | POA: Diagnosis not present

## 2022-02-14 DIAGNOSIS — I10 Essential (primary) hypertension: Secondary | ICD-10-CM | POA: Diagnosis not present

## 2022-02-14 DIAGNOSIS — I69322 Dysarthria following cerebral infarction: Secondary | ICD-10-CM | POA: Diagnosis not present

## 2022-02-14 DIAGNOSIS — E785 Hyperlipidemia, unspecified: Secondary | ICD-10-CM | POA: Diagnosis not present

## 2022-02-17 DIAGNOSIS — J449 Chronic obstructive pulmonary disease, unspecified: Secondary | ICD-10-CM | POA: Diagnosis not present

## 2022-02-17 DIAGNOSIS — R1310 Dysphagia, unspecified: Secondary | ICD-10-CM | POA: Diagnosis not present

## 2022-02-17 DIAGNOSIS — E785 Hyperlipidemia, unspecified: Secondary | ICD-10-CM | POA: Diagnosis not present

## 2022-02-17 DIAGNOSIS — Z7902 Long term (current) use of antithrombotics/antiplatelets: Secondary | ICD-10-CM | POA: Diagnosis not present

## 2022-02-17 DIAGNOSIS — I10 Essential (primary) hypertension: Secondary | ICD-10-CM | POA: Diagnosis not present

## 2022-02-17 DIAGNOSIS — I69322 Dysarthria following cerebral infarction: Secondary | ICD-10-CM | POA: Diagnosis not present

## 2022-02-17 DIAGNOSIS — I69354 Hemiplegia and hemiparesis following cerebral infarction affecting left non-dominant side: Secondary | ICD-10-CM | POA: Diagnosis not present

## 2022-02-17 DIAGNOSIS — K219 Gastro-esophageal reflux disease without esophagitis: Secondary | ICD-10-CM | POA: Diagnosis not present

## 2022-02-17 DIAGNOSIS — Z9181 History of falling: Secondary | ICD-10-CM | POA: Diagnosis not present

## 2022-02-17 DIAGNOSIS — E119 Type 2 diabetes mellitus without complications: Secondary | ICD-10-CM | POA: Diagnosis not present

## 2022-02-18 DIAGNOSIS — E119 Type 2 diabetes mellitus without complications: Secondary | ICD-10-CM | POA: Diagnosis not present

## 2022-02-18 DIAGNOSIS — R1310 Dysphagia, unspecified: Secondary | ICD-10-CM | POA: Diagnosis not present

## 2022-02-18 DIAGNOSIS — Z9181 History of falling: Secondary | ICD-10-CM | POA: Diagnosis not present

## 2022-02-18 DIAGNOSIS — J449 Chronic obstructive pulmonary disease, unspecified: Secondary | ICD-10-CM | POA: Diagnosis not present

## 2022-02-18 DIAGNOSIS — E785 Hyperlipidemia, unspecified: Secondary | ICD-10-CM | POA: Diagnosis not present

## 2022-02-18 DIAGNOSIS — K219 Gastro-esophageal reflux disease without esophagitis: Secondary | ICD-10-CM | POA: Diagnosis not present

## 2022-02-18 DIAGNOSIS — I69354 Hemiplegia and hemiparesis following cerebral infarction affecting left non-dominant side: Secondary | ICD-10-CM | POA: Diagnosis not present

## 2022-02-18 DIAGNOSIS — I10 Essential (primary) hypertension: Secondary | ICD-10-CM | POA: Diagnosis not present

## 2022-02-18 DIAGNOSIS — Z7902 Long term (current) use of antithrombotics/antiplatelets: Secondary | ICD-10-CM | POA: Diagnosis not present

## 2022-02-18 DIAGNOSIS — I69322 Dysarthria following cerebral infarction: Secondary | ICD-10-CM | POA: Diagnosis not present

## 2022-02-21 ENCOUNTER — Encounter: Payer: Self-pay | Admitting: Cardiology

## 2022-02-21 ENCOUNTER — Ambulatory Visit: Payer: Medicare Other | Admitting: Cardiology

## 2022-02-21 VITALS — BP 130/72 | HR 88 | Ht 62.0 in | Wt 136.4 lb

## 2022-02-21 DIAGNOSIS — I10 Essential (primary) hypertension: Secondary | ICD-10-CM

## 2022-02-21 DIAGNOSIS — E782 Mixed hyperlipidemia: Secondary | ICD-10-CM

## 2022-02-21 DIAGNOSIS — I2510 Atherosclerotic heart disease of native coronary artery without angina pectoris: Secondary | ICD-10-CM | POA: Diagnosis not present

## 2022-02-21 NOTE — Addendum Note (Signed)
Addended by: Laurine Blazer on: 02/21/2022 09:22 AM   Modules accepted: Orders

## 2022-02-21 NOTE — Progress Notes (Signed)
Clinical Summary Elizabeth [name] is a 81 y.o.female seen today for follow up of the following medical problems     1. 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    - denies chest, no SOB/DOE - compliant with med     2. HTN - not on ACE or ARB due to allergy  - compliant with meds     3. Hyperlipidemia - 03/2019 TC 241 TG 172 HDL 37 LDL 170 - intolerant to statins.  - zetia did not lower cholesterol per her report so she stopped taking.  - has been resistant to pcsk9 inhibitors, referred to lipid clinic multiple times but has not scheduled appt    11/2021 LDL 185 during admission   4.CVA - admit 11/2021 with right sided lacunar infarct - followed by neurology     SH: Karie Soda sister also a patient of mine.  Retired Marine scientist     Past Medical History:  Diagnosis Date   Absolute anemia 07/28/2018   Active smoker    Allergy    Anxiety    Takes Xanax for anxiety   Arthritis    Asthma    Cataract    Chronic airway obstruction, not elsewhere classified    Clotting disorder (HCC)    stoke    Depression    Diarrhea    Diverticulosis of colon (without mention of hemorrhage)    Esophageal reflux    Family history of malignant neoplasm of gastrointestinal tract    GI bleed    Headache(784.0)    Irregular   Neuromuscular disorder (HCC)    numbness is left hand and left elbow   Obesity, unspecified    Other and unspecified hyperlipidemia    Personal history of colonic polyps    Stricture and stenosis of esophagus    Stroke ([place])    Thyroid disease    Type II or unspecified type diabetes mellitus without mention of complication, not stated as uncontrolled    Unspecified asthma(493.90)    Unspecified essential hypertension      Allergies  Allergen Reactions   Banana Anaphylaxis   Codeine Nausea And Vomiting and Other (See Comments)    PROJECTILE VOMITING   Stevia  [Stevioside] Swelling and Other (See Comments)    Face, tongue, and eye swelling    Actos [Pioglitazone] Swelling   Banana Flavor    Benicar [Olmesartan Medoxomil] Other (See Comments)    Feel bad   Chantix [Varenicline] Other (See Comments)    "Worked opposite."   Diovan [Valsartan] Other (See Comments)    Feel bad   Lipitor [Atorvastatin] Other (See Comments)    REACTION:  Joint pain   Rice Other (See Comments)    Gives patient indigestion   Spinach Other (See Comments)    Stomach problems   Statins Other (See Comments)    Feels bad, leg cramps, worries about liver effects.   Vicodin [Hydrocodone-Acetaminophen] Nausea And Vomiting   Zocor [Simvastatin] Other (See Comments)    REACTION:  "made liver function incorrectly"   Ace Inhibitors Cough   Celebrex [Celecoxib] Rash   Latex Rash and Other (See Comments)    REACTION: red rash   Metformin And Related     GI - upset    Mushroom Extract Complex Palpitations   Penicillins Rash and Other (See Comments)    Has patient had a PCN reaction causing immediate rash, facial/tongue/throat swelling,  SOB or lightheadedness with hypotension: No Has patient had a PCN reaction causing severe rash involving mucus membranes or skin necrosis: No Has patient had a PCN reaction that required hospitalization: No- MD office Has patient had a PCN reaction occurring within the last 10 years: No If all of the above answers are "NO", then may proceed with Cephalosporin use.    Sulfonamide Derivatives Rash     Current Outpatient Medications  Medication Sig Dispense Refill   albuterol (VENTOLIN HFA) 108 (90 Base) MCG/ACT inhaler INHALE 2 PUFFS EVERY SIX HOURS AS NEEDED FOR WHEEZING OR SHORTNESS OF BREATH 8.5 g 2   amLODipine (NORVASC) 10 MG tablet TAKE 1 TABLET BY MOUTH EVERY DAY (Patient taking differently: Take 10 mg by mouth daily.) 90 tablet 5   azelastine (ASTELIN) 0.1 % nasal spray Place 2 sprays into both nostrils 2 (two) times daily. Use in  each nostril as directed 30 mL 12   clopidogrel (PLAVIX) 75 MG tablet TAKE ONE TABLET BY MOUTH DAILY (Patient taking differently: Take 75 mg by mouth daily.) 90 tablet 1   empagliflozin (JARDIANCE) 10 MG TABS tablet Take 1 tablet (10 mg total) by mouth daily before breakfast. 14 tablet 0   Grape Seed OIL by Does not apply route daily at 6 (six) AM.     meclizine (ANTIVERT) 12.5 MG tablet Take 1 tablet (12.5 mg total) by mouth 3 (three) times daily as needed for dizziness. 15 tablet 0   methocarbamol (ROBAXIN) 500 MG tablet Take 1 tablet (500 mg total) by mouth every 6 (six) hours as needed for muscle spasms. 30 tablet 0   montelukast (SINGULAIR) 10 MG tablet Take 1 tablet (10 mg total) by mouth at bedtime. 30 tablet 3   OVER THE COUNTER MEDICATION daily at 6 (six) AM. brain essense     oxybutynin (DITROPAN XL) 15 MG 24 hr tablet 1 tablet DAILY (route: oral)     pantoprazole (PROTONIX) 40 MG tablet TAKE 1 TABLET BY MOUTH DAILY BEFORE BREAKFAST 90 tablet 1   ursodiol (ACTIGALL) 300 MG capsule Take 1 capsule (300 mg total) by mouth 3 (three) times daily. 270 capsule 3   No current facility-administered medications for this visit.     Past Surgical History:  Procedure Laterality Date   APPENDECTOMY     BACK SURGERY     Spinal surgery   BRAIN SURGERY     2013 - stoke ( fall in 2010)    CARDIAC CATHETERIZATION  2006   LAD: 30%, RCA : 20%, normal EF, elevated LVEDP   CARDIAC CATHETERIZATION     CEREBRAL ANGIOGRAM  12/20/2015   COLONOSCOPY N/A 08/09/2018   Procedure: COLONOSCOPY;  Surgeon: Rogene Houston, MD;  [facility];  Service: Endoscopy;  Laterality: N/A;  1:55   ESOPHAGEAL DILATION N/A 08/09/2018   Procedure: ESOPHAGEAL DILATION;  Surgeon: Rogene Houston, MD;  [facility];  Service: Endoscopy;  Laterality: N/A;   ESOPHAGOGASTRODUODENOSCOPY N/A 08/09/2018   Procedure: ESOPHAGOGASTRODUODENOSCOPY (EGD);  Surgeon: Rogene Houston, MD;  [facility];   Service: Endoscopy;  Laterality: N/A;   GIVENS CAPSULE STUDY N/A 08/25/2018   Procedure: GIVENS CAPSULE STUDY;  Surgeon: Rogene Houston, MD;  [facility];  Service: Endoscopy;  Laterality: N/A;   IR ANGIO INTRA EXTRACRAN SEL COM CAROTID INNOMINATE BILAT MOD SED  01/14/2018   IR ANGIO INTRA EXTRACRAN SEL COM CAROTID INNOMINATE BILAT MOD SED  05/11/2018   IR ANGIO VERTEBRAL SEL SUBCLAVIAN  INNOMINATE UNI L MOD SED  01/14/2018   IR ANGIO VERTEBRAL SEL SUBCLAVIAN INNOMINATE UNI L MOD SED  05/11/2018   IR ANGIO VERTEBRAL SEL VERTEBRAL UNI R MOD SED  01/14/2018   IR ANGIO VERTEBRAL SEL VERTEBRAL UNI R MOD SED  05/11/2018   KNEE ARTHROSCOPY     left   LUMBAR DISC SURGERY     POLYPECTOMY  08/09/2018   Procedure: POLYPECTOMY;  Surgeon: Rogene Houston, MD;  [facility];  Service: Endoscopy;;  ascending colon (CS x 2, HS x5) hepatic flexure (HSx1), recto-sigmoid (HSx1)   TONSILLECTOMY     TOTAL ABDOMINAL HYSTERECTOMY     menopause/bleeding     Allergies  Allergen Reactions   Banana Anaphylaxis   Codeine Nausea And Vomiting and Other (See Comments)    PROJECTILE VOMITING   Stevia [Stevioside] Swelling and Other (See Comments)    Face, tongue, and eye swelling    Actos [Pioglitazone] Swelling   Banana Flavor    Benicar [Olmesartan Medoxomil] Other (See Comments)    Feel bad   Chantix [Varenicline] Other (See Comments)    "Worked opposite."   Diovan [Valsartan] Other (See Comments)    Feel bad   Lipitor [Atorvastatin] Other (See Comments)    REACTION:  Joint pain   Rice Other (See Comments)    Gives patient indigestion   Spinach Other (See Comments)    Stomach problems   Statins Other (See Comments)    Feels bad, leg cramps, worries about liver effects.   Vicodin [Hydrocodone-Acetaminophen] Nausea And Vomiting   Zocor [Simvastatin] Other (See Comments)    REACTION:  "made liver function incorrectly"   Ace Inhibitors Cough   Celebrex [Celecoxib] Rash   Latex Rash and  Other (See Comments)    REACTION: red rash   Metformin And Related     GI - upset    Mushroom Extract Complex Palpitations   Penicillins Rash and Other (See Comments)    Has patient had a PCN reaction causing immediate rash, facial/tongue/throat swelling, SOB or lightheadedness with hypotension: No Has patient had a PCN reaction causing severe rash involving mucus membranes or skin necrosis: No Has patient had a PCN reaction that required hospitalization: No- MD office Has patient had a PCN reaction occurring within the last 10 years: No If all of the above answers are "NO", then may proceed with Cephalosporin use.    Sulfonamide Derivatives Rash      Family History  Problem Relation Age of Onset   Diabetes Mother    Heart disease Mother    Asthma Mother    Kidney disease Mother    Drug abuse Mother    Hypertension Mother    Stroke Mother        fall -several bleed on brain 34   Early death Father        trauma   Colon cancer Other        first cousin, paternal aunt and uncle   Cancer Paternal Grandmother        gastric   Diabetes Paternal Grandmother    Mental illness Paternal Grandmother    Heart disease Maternal Grandmother    Thyroid cancer Other        aunt   Heart disease Sister    Mental illness Sister    Heart attack Sister    Heart disease Brother    Lung disease Brother    Cancer Sister        pancreatic  Diabetes Sister    Hypertension Sister    Tuberculosis Maternal Grandfather    Heart disease Paternal Grandfather      Social History Elizabeth [name] reports that she quit smoking about 15 months ago. Her smoking use included cigarettes. She started smoking about 39 years ago. She has a 20.00 pack-year smoking history. She has never used smokeless tobacco. Elizabeth [name] reports no history of alcohol use.   Review of Systems CONSTITUTIONAL: No weight loss, fever, chills, weakness or fatigue.  HEENT: Eyes: No visual loss, blurred vision, double vision or  yellow sclerae.No hearing loss, sneezing, congestion, runny nose or sore throat.  SKIN: No rash or itching.  CARDIOVASCULAR: per hpi RESPIRATORY: No shortness of breath, cough or sputum.  GASTROINTESTINAL: No anorexia, nausea, vomiting or diarrhea. No abdominal pain or blood.  GENITOURINARY: No burning on urination, no polyuria NEUROLOGICAL: No headache, dizziness, syncope, paralysis, ataxia, numbness or tingling in the extremities. No change in bowel or bladder control.  MUSCULOSKELETAL: No muscle, back pain, joint pain or stiffness.  LYMPHATICS: No enlarged nodes. No history of splenectomy.  PSYCHIATRIC: No history of depression or anxiety.  ENDOCRINOLOGIC: No reports of sweating, cold or heat intolerance. No polyuria or polydipsia.  Marland Kitchen   Physical Examination Today's Vitals   02/21/22 0847  BP: 130/72  Pulse: 88  SpO2: 95%  Weight: 136 lb 6.4 oz (61.9 kg)  Height: '5\' 2"'$  (1.575 m)   Body mass index is 24.95 kg/m.  Gen: resting comfortably, no acute distress HEENT: no scleral icterus, pupils equal round and reactive, no palptable cervical adenopathy,  CV: RRR, no m/r/g, no jvd Resp: Clear to auscultation bilaterally GI: abdomen is soft, non-tender, non-distended, normal bowel sounds, no hepatosplenomegaly MSK: extremities are warm, no edema.  Skin: warm, no rash Psych: appropriate affect   Diagnostic Studies  02/2012 Echo: LVEF 60-65%, mild LVH, no WMAs, grade I diastolic dysfunction,    11/4191 Cath DATE OF PROCEDURE: 12/26/2004   DATE OF DISCHARGE: 12/26/2004   CARDIAC CATHETERIZATION   INDICATIONS: Elizabeth [name] is a pleasant 81 year old woman with a history of   hypertension, dyslipidemia, type 2 diabetes mellitus, tobacco use, and   asthma. She has had recent problems with mild dyspnea and lower extremity   edema. Given her cardiac risk factor profile, she is referred for diagnostic   coronary angiography and left ventriculography to assess coronary anatomy   and left  ventricular function.   PROCEDURE:   1. Left heart catheterization.   2. Selective coronary angiography.   3. Left ventriculography.   DESCRIPTION OF PROCEDURE: The area about the right femoral artery was   anesthetized 1% lidocaine and a 4-French sheath was placed in the right   femoral artery via modified Seldinger technique. Standard preformed 4-French   JL-4 and JR-4 catheters used for selective coronary angiography and angled   pigtail catheters used for left heart catheterization and left   ventriculography. All exchanges were made over wire. The patient tolerated   procedure well without immediate complications.   HEMODYNAMIC RESULTS:   1. Left ventricle 151/25 mmHg.   2. Aorta 152/76 mmHg.   ANGIOGRAPHIC FINDINGS:   1. Left main coronary artery is free of significant flow-limiting coronary   atherosclerosis.   2. The left anterior descending is a medium caliber vessel with three small   diagonal branches. There are minor Luminal irregularities noted   throughout this system with approximately 30% diffuse mid-vessel   stenosis. There is also a 30-40% ostial stenosis involving the  first   diagonal Jamaiya Tunnell. No flow-limiting stenoses are noted.   3. The circumflex coronary artery is a medium caliber vessel. There is a   very small Kayra Crowell in the AV groove and two larger bifurcating obtuse   marginal branches. Minor Luminal irregularities are noted without flow-   limiting stenoses.   4. The right coronary artery is a medium caliber vessel with posterior   descending Chanci Ojala. Minor Luminal irregularities are noted. There is a   20% stenosis in the proximal to mid-vessel.   LEFT VENTRICULOGRAPHY: Left ventriculography was performed in the RAO   projection revealing an ejection fraction approximately 65% in the setting   of ventricular ectopy without significant mitral regurgitation or focal wall   motion abnormality.   DIAGNOSES:   1. Mild coronary atherosclerosis as outlined with  no flow-limiting stenoses   in the major epicardial vessels.   2. Left ventricular ejection fraction of approximately 65% in the setting   of ventricular ectopy. No significant mitral regurgitation is noted. The   left ventricular end-diastolic pressure is increased at 25 mmHg. At this   point would suspect an element of diastolic dysfunction particularly   given elevated left renal end-diastolic pressure.   The patient is already on an ACE inhibitor and diuretic. We will plan to   continue maximizing medical therapy. We will arrange a follow-up 2-D   echocardiogram for diastolic parameters and also to exclude   significantly elevated pulmonary pressures. She will have follow-up in   the office to review this and her progress.   07/26/13 Clinic EKG: sinus rhythm, normal axis, RBBB   11/2021 echo [facility] Summary    1. The left ventricle is normal in size with normal wall thickness.    2. The left ventricular systolic function is normal, LVEF is visually  estimated at > 55%.    3. There is grade I diastolic dysfunction (impaired relaxation).    4. The left atrium is mildly dilated in size.    5. The right ventricle is normal in size, with normal systolic function.    6. Agitated saline study is negative.     Assessment and Plan   1. Non-obstructive CAD - no symptoms, continue current meds  EKG SR, no acute ischemic changes   2. HTN -she is at goal, continue current meds   3. Hyperlipidemia - intolerant to statins - resistant to pcsk9 inhibitors previously but since her recent CVA she is willing to start. Will refer to lipid clinic   F/u 1 year.       Arnoldo Lenis, M.D.

## 2022-02-21 NOTE — Patient Instructions (Addendum)
Medication Instructions:  Continue all current medications.  Labwork: none  Testing/Procedures: none  Follow-Up: Your physician wants you to follow up in:  1 year.  You should receive a call from the office when due.  If you don't receive this call, please call our office to schedule the follow up appointment    Any Other Special Instructions Will Be Listed Below (If Applicable). You have been referred to:  Lipid Clinic  If you need a refill on your cardiac medications before your next appointment, please call your pharmacy.

## 2022-02-24 DIAGNOSIS — J449 Chronic obstructive pulmonary disease, unspecified: Secondary | ICD-10-CM | POA: Diagnosis not present

## 2022-02-24 DIAGNOSIS — E785 Hyperlipidemia, unspecified: Secondary | ICD-10-CM | POA: Diagnosis not present

## 2022-02-24 DIAGNOSIS — I69322 Dysarthria following cerebral infarction: Secondary | ICD-10-CM | POA: Diagnosis not present

## 2022-02-24 DIAGNOSIS — Z9181 History of falling: Secondary | ICD-10-CM | POA: Diagnosis not present

## 2022-02-24 DIAGNOSIS — E119 Type 2 diabetes mellitus without complications: Secondary | ICD-10-CM | POA: Diagnosis not present

## 2022-02-24 DIAGNOSIS — Z7984 Long term (current) use of oral hypoglycemic drugs: Secondary | ICD-10-CM | POA: Diagnosis not present

## 2022-02-24 DIAGNOSIS — K219 Gastro-esophageal reflux disease without esophagitis: Secondary | ICD-10-CM | POA: Diagnosis not present

## 2022-02-24 DIAGNOSIS — R1310 Dysphagia, unspecified: Secondary | ICD-10-CM | POA: Diagnosis not present

## 2022-02-24 DIAGNOSIS — I10 Essential (primary) hypertension: Secondary | ICD-10-CM | POA: Diagnosis not present

## 2022-02-24 DIAGNOSIS — Z7902 Long term (current) use of antithrombotics/antiplatelets: Secondary | ICD-10-CM | POA: Diagnosis not present

## 2022-02-24 DIAGNOSIS — I69354 Hemiplegia and hemiparesis following cerebral infarction affecting left non-dominant side: Secondary | ICD-10-CM | POA: Diagnosis not present

## 2022-02-26 DIAGNOSIS — E785 Hyperlipidemia, unspecified: Secondary | ICD-10-CM | POA: Diagnosis not present

## 2022-02-26 DIAGNOSIS — I69354 Hemiplegia and hemiparesis following cerebral infarction affecting left non-dominant side: Secondary | ICD-10-CM | POA: Diagnosis not present

## 2022-02-26 DIAGNOSIS — E119 Type 2 diabetes mellitus without complications: Secondary | ICD-10-CM | POA: Diagnosis not present

## 2022-02-26 DIAGNOSIS — Z9181 History of falling: Secondary | ICD-10-CM | POA: Diagnosis not present

## 2022-02-26 DIAGNOSIS — Z7984 Long term (current) use of oral hypoglycemic drugs: Secondary | ICD-10-CM | POA: Diagnosis not present

## 2022-02-26 DIAGNOSIS — R1310 Dysphagia, unspecified: Secondary | ICD-10-CM | POA: Diagnosis not present

## 2022-02-26 DIAGNOSIS — K219 Gastro-esophageal reflux disease without esophagitis: Secondary | ICD-10-CM | POA: Diagnosis not present

## 2022-02-26 DIAGNOSIS — I10 Essential (primary) hypertension: Secondary | ICD-10-CM | POA: Diagnosis not present

## 2022-02-26 DIAGNOSIS — J449 Chronic obstructive pulmonary disease, unspecified: Secondary | ICD-10-CM | POA: Diagnosis not present

## 2022-02-26 DIAGNOSIS — Z7902 Long term (current) use of antithrombotics/antiplatelets: Secondary | ICD-10-CM | POA: Diagnosis not present

## 2022-02-26 DIAGNOSIS — I69322 Dysarthria following cerebral infarction: Secondary | ICD-10-CM | POA: Diagnosis not present

## 2022-02-27 ENCOUNTER — Telehealth: Payer: Self-pay | Admitting: Nurse Practitioner

## 2022-02-27 NOTE — Telephone Encounter (Signed)
Patient LVM returning call  ?

## 2022-02-27 NOTE — Telephone Encounter (Signed)
Pt called in regards to eye exam being cancelled pt states that she will call the place in [place] and schedule an appt

## 2022-03-03 DIAGNOSIS — I10 Essential (primary) hypertension: Secondary | ICD-10-CM | POA: Diagnosis not present

## 2022-03-03 DIAGNOSIS — E785 Hyperlipidemia, unspecified: Secondary | ICD-10-CM | POA: Diagnosis not present

## 2022-03-03 DIAGNOSIS — I69354 Hemiplegia and hemiparesis following cerebral infarction affecting left non-dominant side: Secondary | ICD-10-CM | POA: Diagnosis not present

## 2022-03-03 DIAGNOSIS — J449 Chronic obstructive pulmonary disease, unspecified: Secondary | ICD-10-CM | POA: Diagnosis not present

## 2022-03-03 DIAGNOSIS — R1310 Dysphagia, unspecified: Secondary | ICD-10-CM | POA: Diagnosis not present

## 2022-03-03 DIAGNOSIS — K219 Gastro-esophageal reflux disease without esophagitis: Secondary | ICD-10-CM | POA: Diagnosis not present

## 2022-03-03 DIAGNOSIS — I69322 Dysarthria following cerebral infarction: Secondary | ICD-10-CM | POA: Diagnosis not present

## 2022-03-03 DIAGNOSIS — E119 Type 2 diabetes mellitus without complications: Secondary | ICD-10-CM | POA: Diagnosis not present

## 2022-03-03 DIAGNOSIS — Z7902 Long term (current) use of antithrombotics/antiplatelets: Secondary | ICD-10-CM | POA: Diagnosis not present

## 2022-03-03 DIAGNOSIS — Z9181 History of falling: Secondary | ICD-10-CM | POA: Diagnosis not present

## 2022-03-03 DIAGNOSIS — Z7984 Long term (current) use of oral hypoglycemic drugs: Secondary | ICD-10-CM | POA: Diagnosis not present

## 2022-03-04 ENCOUNTER — Ambulatory Visit: Payer: Medicare Other

## 2022-03-04 DIAGNOSIS — I69354 Hemiplegia and hemiparesis following cerebral infarction affecting left non-dominant side: Secondary | ICD-10-CM | POA: Diagnosis not present

## 2022-03-04 DIAGNOSIS — Z7984 Long term (current) use of oral hypoglycemic drugs: Secondary | ICD-10-CM | POA: Diagnosis not present

## 2022-03-04 DIAGNOSIS — E785 Hyperlipidemia, unspecified: Secondary | ICD-10-CM | POA: Diagnosis not present

## 2022-03-04 DIAGNOSIS — J449 Chronic obstructive pulmonary disease, unspecified: Secondary | ICD-10-CM | POA: Diagnosis not present

## 2022-03-04 DIAGNOSIS — I69322 Dysarthria following cerebral infarction: Secondary | ICD-10-CM | POA: Diagnosis not present

## 2022-03-04 DIAGNOSIS — E119 Type 2 diabetes mellitus without complications: Secondary | ICD-10-CM | POA: Diagnosis not present

## 2022-03-04 DIAGNOSIS — I10 Essential (primary) hypertension: Secondary | ICD-10-CM | POA: Diagnosis not present

## 2022-03-04 DIAGNOSIS — Z7902 Long term (current) use of antithrombotics/antiplatelets: Secondary | ICD-10-CM | POA: Diagnosis not present

## 2022-03-04 DIAGNOSIS — K219 Gastro-esophageal reflux disease without esophagitis: Secondary | ICD-10-CM | POA: Diagnosis not present

## 2022-03-04 DIAGNOSIS — Z9181 History of falling: Secondary | ICD-10-CM | POA: Diagnosis not present

## 2022-03-04 DIAGNOSIS — R1310 Dysphagia, unspecified: Secondary | ICD-10-CM | POA: Diagnosis not present

## 2022-03-06 ENCOUNTER — Ambulatory Visit: Payer: Medicare Other | Admitting: Pharmacist

## 2022-03-06 DIAGNOSIS — E1169 Type 2 diabetes mellitus with other specified complication: Secondary | ICD-10-CM | POA: Diagnosis not present

## 2022-03-06 DIAGNOSIS — E785 Hyperlipidemia, unspecified: Secondary | ICD-10-CM

## 2022-03-06 NOTE — Progress Notes (Unsigned)
Patient ID: Elizabeth [name]                 DOB: 1941-08-12                    MRN: [id number]     HPI: Elizabeth [name] is a 81 y.o. female patient referred to lipid clinic by Dr Harl Bowie. PMH is significant for HTN, T2DM, CAD, and multiple CVAs. Has been historically resistant to starting lipid lowering medications.  Patient presents today with husband. Patient now in wheelchair and has slurred speech. Nose runs and is drooling. Husband has been checking her blood sugar and trying to care for her.  Patient has a long history if intolerances and reports statins caused liver dysfunction and joint pain. Will not take again. She says she has "tried them all." Patient is retired Therapist, sports.   Current Medications: N/A  Intolerances:  Atorvastatin Simvastatin  Risk Factors:  CAD Hx of CVA T2DM HTN Smoking history  LDL goal: <55  Labs: TC 232, Trigs 101, HDL 58, LDL 156 (08/26/21 - not on any lipid lowering medications)  Past Medical History:  Diagnosis Date   Absolute anemia 07/28/2018   Active smoker    Allergy    Anxiety    Takes Xanax for anxiety   Arthritis    Asthma    Cataract    Chronic airway obstruction, not elsewhere classified    Clotting disorder ([place])    stoke    Depression    Diarrhea    Diverticulosis of colon (without mention of hemorrhage)    Esophageal reflux    Family history of malignant neoplasm of gastrointestinal tract    GI bleed    Headache(784.0)    Irregular   Neuromuscular disorder (HCC)    numbness is left hand and left elbow   Obesity, unspecified    Other and unspecified hyperlipidemia    Personal history of colonic polyps    Stricture and stenosis of esophagus    Stroke ([place])    Thyroid disease    Type II or unspecified type diabetes mellitus without mention of complication, not stated as uncontrolled    Unspecified asthma(493.90)    Unspecified essential hypertension     Current Outpatient Medications on File Prior to Visit  Medication Sig  Dispense Refill   albuterol (VENTOLIN HFA) 108 (90 Base) MCG/ACT inhaler INHALE 2 PUFFS EVERY SIX HOURS AS NEEDED FOR WHEEZING OR SHORTNESS OF BREATH 8.5 g 2   amLODipine (NORVASC) 10 MG tablet TAKE 1 TABLET BY MOUTH EVERY DAY (Patient taking differently: Take 10 mg by mouth daily.) 90 tablet 5   azelastine (ASTELIN) 0.1 % nasal spray Place 2 sprays into both nostrils 2 (two) times daily. Use in each nostril as directed 30 mL 12   clopidogrel (PLAVIX) 75 MG tablet TAKE ONE TABLET BY MOUTH DAILY (Patient taking differently: Take 75 mg by mouth daily.) 90 tablet 1   empagliflozin (JARDIANCE) 10 MG TABS tablet Take 1 tablet (10 mg total) by mouth daily before breakfast. 14 tablet 0   Grape Seed OIL by Does not apply route daily at 6 (six) AM.     meclizine (ANTIVERT) 12.5 MG tablet Take 1 tablet (12.5 mg total) by mouth 3 (three) times daily as needed for dizziness. 15 tablet 0   methocarbamol (ROBAXIN) 500 MG tablet Take 1 tablet (500 mg total) by mouth every 6 (six) hours as needed for muscle spasms. 30 tablet 0   montelukast (SINGULAIR)  10 MG tablet Take 1 tablet (10 mg total) by mouth at bedtime. 30 tablet 3   OVER THE COUNTER MEDICATION daily at 6 (six) AM. brain essense     oxybutynin (DITROPAN XL) 15 MG 24 hr tablet 1 tablet DAILY (route: oral)     pantoprazole (PROTONIX) 40 MG tablet TAKE 1 TABLET BY MOUTH DAILY BEFORE BREAKFAST 90 tablet 1   ursodiol (ACTIGALL) 300 MG capsule Take 1 capsule (300 mg total) by mouth 3 (three) times daily. 270 capsule 3   [DISCONTINUED] ramipril (ALTACE) 10 MG capsule Take 10 mg by mouth daily.       No current facility-administered medications on file prior to visit.    Allergies  Allergen Reactions   Banana Anaphylaxis   Codeine Nausea And Vomiting and Other (See Comments)    PROJECTILE VOMITING   Stevia [Stevioside] Swelling and Other (See Comments)    Face, tongue, and eye swelling    Actos [Pioglitazone] Swelling   Banana Flavor    Benicar  [Olmesartan Medoxomil] Other (See Comments)    Feel bad   Chantix [Varenicline] Other (See Comments)    "Worked opposite."   Diovan [Valsartan] Other (See Comments)    Feel bad   Lipitor [Atorvastatin] Other (See Comments)    REACTION:  Joint pain   Rice Other (See Comments)    Gives patient indigestion   Spinach Other (See Comments)    Stomach problems   Statins Other (See Comments)    Feels bad, leg cramps, worries about liver effects.   Vicodin [Hydrocodone-Acetaminophen] Nausea And Vomiting   Zocor [Simvastatin] Other (See Comments)    REACTION:  "made liver function incorrectly"   Ace Inhibitors Cough   Celebrex [Celecoxib] Rash   Latex Rash and Other (See Comments)    REACTION: red rash   Metformin And Related     GI - upset    Mushroom Extract Complex Palpitations   Penicillins Rash and Other (See Comments)    Has patient had a PCN reaction causing immediate rash, facial/tongue/throat swelling, SOB or lightheadedness with hypotension: No Has patient had a PCN reaction causing severe rash involving mucus membranes or skin necrosis: No Has patient had a PCN reaction that required hospitalization: No- MD office Has patient had a PCN reaction occurring within the last 10 years: No If all of the above answers are "NO", then may proceed with Cephalosporin use.    Sulfonamide Derivatives Rash    Assessment/Plan:  1. Hyperlipidemia - Patient last LDL 156 which is above goal of <55.  Aggressive goal chosen due to history of stroke, CAD, and T2DM.  Patient is now willing to start PCSK9i.  Using demo pen, educated patient on mechanism of action, storage, site selection, administration, and possible adverse effects. Will complete PA and contact patient when approved.  Recheck lipid panel in 2-3 months.  Husband reported difficulty testing patient's blood glucose.  Using lancet, educated husband on proper procedure on how to do point of care testing. Husband appreciative.  Recheck  as needed.  Start Repatha/Praluent sq q 14 days Recheck lipid panel in 2-3 months  Karren Cobble, PharmD, [place], [place], [place] [address], [place], [place], [address] Phone: [phone], Fax: [phone]

## 2022-03-06 NOTE — Patient Instructions (Addendum)
It was nice meeting you 2 today  We would like to start you on a new medication called Repatha or Praluent  I will submit the prior authorization for you and contact you when it is approved  Once you start the new medicine we will recheck your cholesterol in about 2-3 months  Please give me a call with any questions  Karren Cobble, PharmD, [place], [place], [place], [place], [place], [address] Phone: [phone], Fax: [phone]

## 2022-03-07 ENCOUNTER — Telehealth: Payer: Self-pay | Admitting: Pharmacist

## 2022-03-07 ENCOUNTER — Telehealth: Payer: Self-pay

## 2022-03-07 ENCOUNTER — Encounter: Payer: Self-pay | Admitting: Pharmacist

## 2022-03-07 DIAGNOSIS — E1169 Type 2 diabetes mellitus with other specified complication: Secondary | ICD-10-CM

## 2022-03-07 DIAGNOSIS — I639 Cerebral infarction, unspecified: Secondary | ICD-10-CM

## 2022-03-07 MED ORDER — REPATHA SURECLICK 140 MG/ML ~~LOC~~ SOAJ: 1.0000 mL | SUBCUTANEOUS | 3 refills | Status: DC

## 2022-03-07 NOTE — Telephone Encounter (Signed)
FYI

## 2022-03-07 NOTE — Telephone Encounter (Signed)
PA for Repatha approved through 09/06/22.  Called patient and LMOM. Advised to check lipid panel in 2-3 months.

## 2022-03-07 NOTE — Telephone Encounter (Signed)
FYI Patient called to let Kristin Bruins know she was seen at the cardiology in [facility] Dr Harl Bowie put her on this medicine Repath medicine.

## 2022-03-10 DIAGNOSIS — R1310 Dysphagia, unspecified: Secondary | ICD-10-CM | POA: Diagnosis not present

## 2022-03-10 DIAGNOSIS — I69322 Dysarthria following cerebral infarction: Secondary | ICD-10-CM | POA: Diagnosis not present

## 2022-03-10 DIAGNOSIS — I69354 Hemiplegia and hemiparesis following cerebral infarction affecting left non-dominant side: Secondary | ICD-10-CM | POA: Diagnosis not present

## 2022-03-10 DIAGNOSIS — Z7984 Long term (current) use of oral hypoglycemic drugs: Secondary | ICD-10-CM | POA: Diagnosis not present

## 2022-03-10 DIAGNOSIS — K219 Gastro-esophageal reflux disease without esophagitis: Secondary | ICD-10-CM | POA: Diagnosis not present

## 2022-03-10 DIAGNOSIS — J449 Chronic obstructive pulmonary disease, unspecified: Secondary | ICD-10-CM | POA: Diagnosis not present

## 2022-03-10 DIAGNOSIS — Z9181 History of falling: Secondary | ICD-10-CM | POA: Diagnosis not present

## 2022-03-10 DIAGNOSIS — E119 Type 2 diabetes mellitus without complications: Secondary | ICD-10-CM | POA: Diagnosis not present

## 2022-03-10 DIAGNOSIS — E785 Hyperlipidemia, unspecified: Secondary | ICD-10-CM | POA: Diagnosis not present

## 2022-03-10 DIAGNOSIS — Z7902 Long term (current) use of antithrombotics/antiplatelets: Secondary | ICD-10-CM | POA: Diagnosis not present

## 2022-03-10 DIAGNOSIS — I10 Essential (primary) hypertension: Secondary | ICD-10-CM | POA: Diagnosis not present

## 2022-03-12 DIAGNOSIS — I6381 Other cerebral infarction due to occlusion or stenosis of small artery: Secondary | ICD-10-CM | POA: Diagnosis not present

## 2022-03-14 DIAGNOSIS — Z9181 History of falling: Secondary | ICD-10-CM | POA: Diagnosis not present

## 2022-03-14 DIAGNOSIS — Z7902 Long term (current) use of antithrombotics/antiplatelets: Secondary | ICD-10-CM | POA: Diagnosis not present

## 2022-03-14 DIAGNOSIS — I10 Essential (primary) hypertension: Secondary | ICD-10-CM | POA: Diagnosis not present

## 2022-03-14 DIAGNOSIS — E785 Hyperlipidemia, unspecified: Secondary | ICD-10-CM | POA: Diagnosis not present

## 2022-03-14 DIAGNOSIS — E119 Type 2 diabetes mellitus without complications: Secondary | ICD-10-CM | POA: Diagnosis not present

## 2022-03-14 DIAGNOSIS — R1310 Dysphagia, unspecified: Secondary | ICD-10-CM | POA: Diagnosis not present

## 2022-03-14 DIAGNOSIS — I69322 Dysarthria following cerebral infarction: Secondary | ICD-10-CM | POA: Diagnosis not present

## 2022-03-14 DIAGNOSIS — K219 Gastro-esophageal reflux disease without esophagitis: Secondary | ICD-10-CM | POA: Diagnosis not present

## 2022-03-14 DIAGNOSIS — Z7984 Long term (current) use of oral hypoglycemic drugs: Secondary | ICD-10-CM | POA: Diagnosis not present

## 2022-03-14 DIAGNOSIS — I69354 Hemiplegia and hemiparesis following cerebral infarction affecting left non-dominant side: Secondary | ICD-10-CM | POA: Diagnosis not present

## 2022-03-14 DIAGNOSIS — J449 Chronic obstructive pulmonary disease, unspecified: Secondary | ICD-10-CM | POA: Diagnosis not present

## 2022-03-16 ENCOUNTER — Other Ambulatory Visit: Payer: Self-pay | Admitting: Family Medicine

## 2022-03-18 ENCOUNTER — Ambulatory Visit: Payer: Medicare Other | Admitting: *Deleted

## 2022-03-18 DIAGNOSIS — E119 Type 2 diabetes mellitus without complications: Secondary | ICD-10-CM | POA: Diagnosis not present

## 2022-03-18 DIAGNOSIS — R1310 Dysphagia, unspecified: Secondary | ICD-10-CM | POA: Diagnosis not present

## 2022-03-18 DIAGNOSIS — I69354 Hemiplegia and hemiparesis following cerebral infarction affecting left non-dominant side: Secondary | ICD-10-CM | POA: Diagnosis not present

## 2022-03-18 DIAGNOSIS — Z7984 Long term (current) use of oral hypoglycemic drugs: Secondary | ICD-10-CM | POA: Diagnosis not present

## 2022-03-18 DIAGNOSIS — Z7902 Long term (current) use of antithrombotics/antiplatelets: Secondary | ICD-10-CM | POA: Diagnosis not present

## 2022-03-18 DIAGNOSIS — J449 Chronic obstructive pulmonary disease, unspecified: Secondary | ICD-10-CM | POA: Diagnosis not present

## 2022-03-18 DIAGNOSIS — E785 Hyperlipidemia, unspecified: Secondary | ICD-10-CM | POA: Diagnosis not present

## 2022-03-18 DIAGNOSIS — Z9181 History of falling: Secondary | ICD-10-CM | POA: Diagnosis not present

## 2022-03-18 DIAGNOSIS — K219 Gastro-esophageal reflux disease without esophagitis: Secondary | ICD-10-CM | POA: Diagnosis not present

## 2022-03-18 DIAGNOSIS — I10 Essential (primary) hypertension: Secondary | ICD-10-CM | POA: Diagnosis not present

## 2022-03-18 DIAGNOSIS — I69322 Dysarthria following cerebral infarction: Secondary | ICD-10-CM | POA: Diagnosis not present

## 2022-03-18 NOTE — Chronic Care Management (AMB) (Signed)
   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  Case closed  [facility], BSN RN Case Manager [facility] [phone]

## 2022-03-20 ENCOUNTER — Other Ambulatory Visit: Payer: Self-pay | Admitting: Nurse Practitioner

## 2022-03-20 NOTE — Telephone Encounter (Signed)
Please advise ok to fill

## 2022-03-24 DIAGNOSIS — K219 Gastro-esophageal reflux disease without esophagitis: Secondary | ICD-10-CM | POA: Diagnosis not present

## 2022-03-24 DIAGNOSIS — Z7902 Long term (current) use of antithrombotics/antiplatelets: Secondary | ICD-10-CM | POA: Diagnosis not present

## 2022-03-24 DIAGNOSIS — I69354 Hemiplegia and hemiparesis following cerebral infarction affecting left non-dominant side: Secondary | ICD-10-CM | POA: Diagnosis not present

## 2022-03-24 DIAGNOSIS — E119 Type 2 diabetes mellitus without complications: Secondary | ICD-10-CM | POA: Diagnosis not present

## 2022-03-24 DIAGNOSIS — J449 Chronic obstructive pulmonary disease, unspecified: Secondary | ICD-10-CM | POA: Diagnosis not present

## 2022-03-24 DIAGNOSIS — Z7984 Long term (current) use of oral hypoglycemic drugs: Secondary | ICD-10-CM | POA: Diagnosis not present

## 2022-03-24 DIAGNOSIS — E785 Hyperlipidemia, unspecified: Secondary | ICD-10-CM | POA: Diagnosis not present

## 2022-03-24 DIAGNOSIS — I10 Essential (primary) hypertension: Secondary | ICD-10-CM | POA: Diagnosis not present

## 2022-03-24 DIAGNOSIS — R1310 Dysphagia, unspecified: Secondary | ICD-10-CM | POA: Diagnosis not present

## 2022-03-24 DIAGNOSIS — Z9181 History of falling: Secondary | ICD-10-CM | POA: Diagnosis not present

## 2022-03-24 DIAGNOSIS — I69322 Dysarthria following cerebral infarction: Secondary | ICD-10-CM | POA: Diagnosis not present

## 2022-03-25 ENCOUNTER — Other Ambulatory Visit: Payer: Self-pay

## 2022-03-25 ENCOUNTER — Emergency Department (HOSPITAL_COMMUNITY)
Admission: EM | Admit: 2022-03-25 | Discharge: 2022-03-25 | Disposition: A | Discharge status: Home / Self Care | Payer: Medicare Other | Attending: Emergency Medicine | Admitting: Emergency Medicine

## 2022-03-25 ENCOUNTER — Encounter (HOSPITAL_COMMUNITY): Payer: Self-pay

## 2022-03-25 ENCOUNTER — Emergency Department (HOSPITAL_COMMUNITY): Payer: Medicare Other

## 2022-03-25 DIAGNOSIS — S42295A Other nondisplaced fracture of upper end of left humerus, initial encounter for closed fracture: Secondary | ICD-10-CM | POA: Insufficient documentation

## 2022-03-25 DIAGNOSIS — W010XXA Fall on same level from slipping, tripping and stumbling without subsequent striking against object, initial encounter: Secondary | ICD-10-CM | POA: Diagnosis not present

## 2022-03-25 DIAGNOSIS — E119 Type 2 diabetes mellitus without complications: Secondary | ICD-10-CM | POA: Diagnosis not present

## 2022-03-25 DIAGNOSIS — Z7902 Long term (current) use of antithrombotics/antiplatelets: Secondary | ICD-10-CM | POA: Insufficient documentation

## 2022-03-25 DIAGNOSIS — Z91040 Latex allergy status: Secondary | ICD-10-CM | POA: Insufficient documentation

## 2022-03-25 DIAGNOSIS — I10 Essential (primary) hypertension: Secondary | ICD-10-CM | POA: Diagnosis not present

## 2022-03-25 DIAGNOSIS — S4992XA Unspecified injury of left shoulder and upper arm, initial encounter: Secondary | ICD-10-CM | POA: Diagnosis present

## 2022-03-25 DIAGNOSIS — Z79899 Other long term (current) drug therapy: Secondary | ICD-10-CM | POA: Diagnosis not present

## 2022-03-25 DIAGNOSIS — S42202A Unspecified fracture of upper end of left humerus, initial encounter for closed fracture: Secondary | ICD-10-CM | POA: Diagnosis not present

## 2022-03-25 DIAGNOSIS — S42352A Displaced comminuted fracture of shaft of humerus, left arm, initial encounter for closed fracture: Secondary | ICD-10-CM | POA: Diagnosis not present

## 2022-03-25 DIAGNOSIS — W19XXXA Unspecified fall, initial encounter: Secondary | ICD-10-CM

## 2022-03-25 MED ORDER — TRAMADOL HCL 50 MG PO TABS
50.0000 mg | ORAL_TABLET | Freq: Once | ORAL | Status: AC
  Administered 2022-03-25: 50 mg via ORAL
  Filled 2022-03-25: qty 1

## 2022-03-25 MED ORDER — ONDANSETRON 4 MG PO TBDP
4.0000 mg | ORAL_TABLET | Freq: Once | ORAL | Status: AC
Start: 2022-03-25 — End: 2022-03-25
  Administered 2022-03-25: 4 mg via ORAL
  Filled 2022-03-25: qty 1

## 2022-03-25 MED ORDER — TRAMADOL HCL 50 MG PO TABS: 50.0000 mg | ORAL_TABLET | Freq: Four times a day (QID) | ORAL | 0 refills | Status: DC | PRN

## 2022-03-25 MED ORDER — ONDANSETRON HCL 4 MG PO TABS: 4.0000 mg | ORAL_TABLET | Freq: Four times a day (QID) | ORAL | 0 refills | Status: DC

## 2022-03-25 NOTE — Discharge Instructions (Signed)
Your injury generally should heal without any need of intervention such as surgery, you will need to have the arm immobilized at all times by wearing the sling which we have supplied.  You have also been prescribed pain medicine and nausea medicine in case the pain medicine makes you nauseated.  Use caution with tramadol as this medicine can make you drowsy.  Do not wear plain socks when walking in your home as this seems to have triggered your fall, wear socks with gripper bottoms or your shoes as discussed.

## 2022-03-25 NOTE — ED Provider Notes (Signed)
[facility] EMERGENCY DEPARTMENT Provider Note   CSN: [id number] Arrival date & time: 03/25/22  8938     History  Chief Complaint  Patient presents with   Shoulder Pain    Elizabeth [name] is a 81 y.o. female with history including HTN, Type 2 DM, cva with residual right sided weakness, but ambulatory using a walker, slipped and fell last night.  She describes getting up around 1 am to use her bedside commode. She was wearing socks and when she tried to stand, the feet slipped and she landed on her left side causing pain to the right shoulder.  She was able to get up and went back to bed, but woke with significant persistent pain.  She denies any new or worsened weakness in the arm or hand.  She denies hitting her head and has no other complaint of pain. Pain is worsened with movement, better at rest.  She denies head, neck, chest pain.   The history is provided by the patient and a relative (granddaughter at bedside).       Home Medications Prior to Admission medications   Medication Sig Start Date End Date Taking? Authorizing Provider  ondansetron (ZOFRAN) 4 MG tablet Take 1 tablet (4 mg total) by mouth every 6 (six) hours. 03/25/22  Yes Gabriellia Rempel, Almyra Free, PA-C  traMADol (ULTRAM) 50 MG tablet Take 1 tablet (50 mg total) by mouth every 6 (six) hours as needed. 03/25/22  Yes Tatia Petrucci, Almyra Free, PA-C  albuterol (VENTOLIN HFA) 108 (90 Base) MCG/ACT inhaler INHALE 2 PUFFS EVERY SIX HOURS AS NEEDED FOR WHEEZING OR SHORTNESS OF BREATH 09/05/19   Ronnie Doss M, DO  amLODipine (NORVASC) 10 MG tablet TAKE 1 TABLET BY MOUTH EVERY DAY Patient taking differently: Take 10 mg by mouth daily. 10/01/21   Paseda, Dewaine Conger, FNP  azelastine (ASTELIN) 0.1 % nasal spray Place 2 sprays into both nostrils 2 (two) times daily. Use in each nostril as directed 12/19/21   Fayrene Helper, MD  clopidogrel (PLAVIX) 75 MG tablet Take 1 tablet (75 mg total) by mouth daily. 03/20/22   Paseda, Dewaine Conger, FNP  empagliflozin  (JARDIANCE) 10 MG TABS tablet Take 1 tablet (10 mg total) by mouth daily before breakfast. 08/15/21   Lindell Spar, MD  Evolocumab (REPATHA SURECLICK) 101 MG/ML SOAJ Inject 1 mL into the skin every 14 (fourteen) days. 03/07/22   Arnoldo Lenis, MD  Grape Seed OIL by Does not apply route daily at 6 (six) AM.    [provider]  meclizine (ANTIVERT) 12.5 MG tablet Take 1 tablet (12.5 mg total) by mouth 3 (three) times daily as needed for dizziness. 02/07/22   Paseda, Dewaine Conger, FNP  methocarbamol (ROBAXIN) 500 MG tablet Take 1 tablet (500 mg total) by mouth every 6 (six) hours as needed for muscle spasms. 12/11/21   Setzer, Edman Circle, PA-C  montelukast (SINGULAIR) 10 MG tablet TAKE 1 TABLET BY MOUTH AT BEDTIME 03/17/22   Paseda, Dewaine Conger, FNP  OVER THE COUNTER MEDICATION daily at 6 (six) AM. brain essense    [provider]  oxybutynin (DITROPAN XL) 15 MG 24 hr tablet 1 tablet DAILY (route: oral) 12/09/21   [provider]  pantoprazole (PROTONIX) 40 MG tablet TAKE 1 TABLET BY MOUTH DAILY BEFORE BREAKFAST 01/22/22   Rehman, Mechele Dawley, MD  ursodiol (ACTIGALL) 300 MG capsule Take 1 capsule (300 mg total) by mouth 3 (three) times daily. 12/26/21   Harvel Quale, MD  ramipril ([place])  10 MG capsule Take 10 mg by mouth daily.    06/29/18  [provider]      Allergies    Banana, Codeine, Stevia [stevioside], Actos [pioglitazone], Banana flavor, Benicar [olmesartan medoxomil], Chantix [varenicline], Diovan [valsartan], Lipitor [atorvastatin], Rice, Spinach, Statins, Vicodin [hydrocodone-acetaminophen], Zocor [simvastatin], Ace inhibitors, Celebrex [celecoxib], Latex, Metformin and related, Mushroom extract complex, Penicillins, and Sulfonamide derivatives    Review of Systems   Review of Systems  Constitutional:  Negative for fever.  Cardiovascular: Negative.   Gastrointestinal: Negative.   Musculoskeletal:  Positive for arthralgias and joint swelling.  Negative for myalgias and neck pain.  Skin:  Negative for color change.  Neurological:  Negative for dizziness, facial asymmetry, speech difficulty, weakness, numbness and headaches.    Physical Exam Updated Vital Signs BP (!) 153/66   Pulse 70   Temp 98.1 F (36.7 C) (Oral)   Resp 16   Ht '5\' 2"'$  (1.575 m)   Wt 65.8 kg   SpO2 96%   BMI 26.52 kg/m  Physical Exam Constitutional:      Appearance: She is well-developed.  HENT:     Head: Atraumatic.  Cardiovascular:     Pulses:          Radial pulses are 2+ on the right side and 2+ on the left side.     Comments: Pulses equal bilaterally Musculoskeletal:        General: Swelling and tenderness present.     Left shoulder: Swelling and bony tenderness present. Decreased range of motion.     Cervical back: Normal range of motion.  Skin:    General: Skin is warm and dry.     Comments: Subtle bruising left upper lateral humerus. Skin intact.  Neurological:     Mental Status: She is alert.     Sensory: No sensory deficit.     Motor: No weakness.     Deep Tendon Reflexes: Reflexes normal.     ED Results / Procedures / Treatments   Labs (all labs ordered are listed, but only abnormal results are displayed) Labs Reviewed - No data to display  EKG None  Radiology DG Shoulder Left  Result Date: 03/25/2022 CLINICAL DATA:  Trauma, fall EXAM: LEFT SHOULDER - 2+ VIEW COMPARISON:  06/29/2018 FINDINGS: There is oblique comminuted fracture in the proximal shaft of left humerus. In the lateral view, there is anterior displacement of distal major fracture fragment. There is no dislocation. Osteopenia is seen in bony structures. Bony spurs are seen in shoulder and AC joints. IMPRESSION: Comminuted displaced fracture is seen proximal shaft of left humerus. Electronically Signed   By: Elmer Picker M.D.   On: 03/25/2022 10:07    Procedures Procedures    Medications Ordered in ED Medications  traMADol (ULTRAM) tablet 50 mg (50 mg  Oral Given 03/25/22 0944)  ondansetron (ZOFRAN-ODT) disintegrating tablet 4 mg (4 mg Oral Given 03/25/22 0944)    ED Course/ Medical Decision Making/ A&P                           Medical Decision Making Pt with left humerus fracture, comminuted, proximal shaft.  Neurovascular intact.  She was treated with tramadol and obtained sig pain relief.  Placed in sling and swath, pt tolerated well.  Pt will need close f/u with ortho, referred to Dr. Aline Brochure for f/u care.  Pt was ambulated prior to dc and was stable with ambulation.    Pt is alert, oriented,  no n/v, no headache, no complaint of head or neck injury.  Fall occurred 8 hours ago, no indication for imaging of brain, doubt intracranial injury.  Amount and/or Complexity of Data Reviewed Radiology: ordered and independent interpretation performed.    Details: per above  Risk Prescription drug management.           Final Clinical Impression(s) / ED Diagnoses Final diagnoses:  Other closed nondisplaced fracture of proximal end of left humerus, initial encounter  Fall, initial encounter    Rx / DC Orders ED Discharge Orders          Ordered    traMADol (ULTRAM) 50 MG tablet  Every 6 hours PRN        03/25/22 1150    ondansetron (ZOFRAN) 4 MG tablet  Every 6 hours        03/25/22 1150              Evalee Jefferson, PA-C 03/26/22 2231    Milton Ferguson, MD 03/27/22 1034

## 2022-03-25 NOTE — ED Notes (Signed)
TOC consulted for HH/night time services. CSW spoke with pts granddaughter in ED. Pt currently has [facility] with PT and OT coming into the home. Pt lives with her husband.   CSW explained that services providing care at night will be private pay. CSW provided granddaughter with contact number for service. TOC signing off.

## 2022-03-25 NOTE — ED Notes (Signed)
Pt sling applied to L arm. Pt ambulated with a walker using her R hand and standby assistance. Pt ambulated approximately 3' without issue.

## 2022-03-25 NOTE — ED Triage Notes (Signed)
Pt presents to ED with complaints of left shoulder pain after fall last night. Pt denies LOC or hitting head. Pt is on blood thinners, unsure which one.

## 2022-03-26 ENCOUNTER — Encounter: Payer: Self-pay | Admitting: Orthopedic Surgery

## 2022-03-26 ENCOUNTER — Ambulatory Visit: Payer: Medicare Other | Admitting: Orthopedic Surgery

## 2022-03-26 VITALS — BP 129/99 | HR 79 | Ht 62.0 in | Wt 145.0 lb

## 2022-03-26 DIAGNOSIS — W19XXXA Unspecified fall, initial encounter: Secondary | ICD-10-CM

## 2022-03-26 DIAGNOSIS — S42295A Other nondisplaced fracture of upper end of left humerus, initial encounter for closed fracture: Secondary | ICD-10-CM | POA: Diagnosis not present

## 2022-03-26 NOTE — Progress Notes (Signed)
Chief Complaint  Patient presents with   Shoulder Injury    Left  03/24/22    HPI: 81 year old female mechanical fall July 17 sustained a proximal humerus fracture presents with pain and inability to really use her left arm.  Past Medical History:  Diagnosis Date   Absolute anemia 07/28/2018   Active smoker    Allergy    Anxiety    Takes Xanax for anxiety   Arthritis    Asthma    Cataract    Chronic airway obstruction, not elsewhere classified    Clotting disorder ([place])    stoke    Depression    Diarrhea    Diverticulosis of colon (without mention of hemorrhage)    Esophageal reflux    Family history of malignant neoplasm of gastrointestinal tract    GI bleed    Headache(784.0)    Irregular   Neuromuscular disorder (HCC)    numbness is left hand and left elbow   Obesity, unspecified    Other and unspecified hyperlipidemia    Personal history of colonic polyps    Stricture and stenosis of esophagus    Stroke ([place])    Thyroid disease    Type II or unspecified type diabetes mellitus without mention of complication, not stated as uncontrolled    Unspecified asthma(493.90)    Unspecified essential hypertension     BP (!) 129/99   Pulse 79   Ht '5\' 2"'$  (1.575 m)   Wt 145 lb (65.8 kg)   BMI 26.52 kg/m    General appearance: Well-developed well-nourished no gross deformities  Cardiovascular normal pulse and perfusion normal color without edema  Neurologically no sensation loss or deficits or pathologic reflexes  Psychological: Awake alert and oriented x3 mood and affect normal  Skin no lacerations or ulcerations no nodularity no palpable masses, no erythema or nodularity  Musculoskeletal: She has a bruise on the skin of the left upper extremity mainly posteriorly the arm alignment is normal but there is swelling she has good distal neurovascular function.  Imaging I read the images my interpretation is that she has a proximal humerus fracture there is slight  angulation on the lateral view the AP view is lined up normally fracture slightly lower than the surgical neck and extends into the shaft a little bit  A/P  81 year old female minimal angulation history of heart disease diabetes thyroid disease stroke she is on a blood thinner Plavix  Recommend nonoperative treatment.  3 weeks of sling treatment.  X-ray next week to check alignment.

## 2022-03-26 NOTE — Patient Instructions (Signed)
Wear sling x 3 weeks   Xrays next weeks   Take medicationa s ordered, call the office when you need a refill

## 2022-03-27 ENCOUNTER — Telehealth: Payer: Self-pay | Admitting: Radiology

## 2022-03-27 ENCOUNTER — Other Ambulatory Visit: Payer: Self-pay | Admitting: Orthopedic Surgery

## 2022-03-27 MED ORDER — TRAMADOL HCL 50 MG PO TABS: 50.0000 mg | ORAL_TABLET | Freq: Four times a day (QID) | ORAL | 0 refills | Status: AC | PRN

## 2022-03-27 NOTE — Telephone Encounter (Signed)
Patient having a lot of pain has tried to loosen sling cant lie down cant get comfortable, cant relax asking if you will send in something for pain to [facility] drug

## 2022-03-27 NOTE — Progress Notes (Signed)
Meds ordered this encounter  Medications   traMADol (ULTRAM) 50 MG tablet    Sig: Take 1 tablet (50 mg total) by mouth every 6 (six) hours as needed for up to 5 days.    Dispense:  20 tablet    Refill:  0

## 2022-03-28 ENCOUNTER — Encounter: Payer: Medicare Other | Attending: Registered Nurse | Admitting: Physical Medicine & Rehabilitation

## 2022-03-28 ENCOUNTER — Encounter: Payer: Self-pay | Admitting: Physical Medicine & Rehabilitation

## 2022-03-28 VITALS — BP 130/81 | HR 82 | Ht 62.0 in

## 2022-03-28 DIAGNOSIS — I69354 Hemiplegia and hemiparesis following cerebral infarction affecting left non-dominant side: Secondary | ICD-10-CM | POA: Insufficient documentation

## 2022-03-28 NOTE — Patient Instructions (Signed)
Dr Aline Brochure is prescribing pain meds

## 2022-03-28 NOTE — Progress Notes (Signed)
Subjective:    Patient ID: Elizabeth [name], female    DOB: 1941-08-28, 81 y.o.   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  Patient was transferred to inpatient rehab from outside hospital.  She completed inpatient rehabilitation on 12/11/2021 at [facility].  She was receiving home health PT and OT. [place] and hurt left shoulder 7/18, foot slipped during transfer to [facility] at 1 AM.  Had immediate onset left shoulder pain.  Went to the emergency department.  X-rays at [facility], ER demonstrated comminuted left proximal humeral fracture.  Patient has seen orthopedics yesterday.  Orthopedics did order a sling as well as tramadol for pain  Pain Inventory Average Pain 8 Pain Right Now 8 My pain is intermittent, sharp, stabbing, tingling, and aching  LOCATION OF PAIN  Left arm pain, left hip   BOWEL Number of stools per week: 2 Oral laxative use Yes  Type of laxative Miralax    BLADDER Normal and Pads Frequent urination  yes. OAB    Mobility walk without assistance use a cane use a walker how many minutes can you walk? Unknown  ability to climb steps?  yes do you drive?  no use a wheelchair needs help with transfers Do you have any goals in this area?  yes  Function retired I need assistance with the following:  dressing, bathing, toileting, meal prep, household duties, and shopping Do you have any goals in this area?  yes  Neuro/Psych weakness tingling trouble walking depression anxiety  Prior Studies Any changes since last visit?  yes x-rays Forestine Na in the for right shoulder injury  Physicians involved in your care Any changes since last visit?  no   Family History  Problem Relation Age of Onset   Diabetes Mother    Heart disease Mother    Asthma Mother    Kidney disease Mother    Drug abuse Mother    Hypertension Mother    Stroke Mother        fall -several bleed on brain 9    Early death Father        trauma   Colon cancer Other        first cousin, paternal aunt and uncle   Cancer Paternal Grandmother        gastric   Diabetes Paternal Grandmother    Mental illness Paternal Grandmother    Heart disease Maternal Grandmother    Thyroid cancer Other        aunt   Heart disease Sister    Mental illness Sister    Heart attack Sister    Heart disease Brother    Lung disease Brother    Cancer Sister        pancreatic   Diabetes Sister    Hypertension Sister    Tuberculosis Maternal Grandfather    Heart disease Paternal Grandfather    Social History   Socioeconomic History   Marital status: Married    Spouse name: [name]   Number of children: 0   Years of education: 16   Highest education level: Bachelor's degree (e.g., BA, AB, BS)  Occupational History   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   Tobacco Use   Smoking status: Former    Packs/day: 1.00    Years: 20.00    Total pack years: 20.00    Types: Cigarettes    Start date: 01/15/1983  Quit date: 11/09/2020    Years since quitting: 1.3   Smokeless tobacco: Never  Vaping Use   Vaping Use: Some days   Substances: THC, CBD, Flavoring, Nicotine-salt, Synthetic cannabinoids, Mixture of cannabinoids, Homemade substance  Substance and Sexual Activity   Alcohol use: No    Alcohol/week: 0.0 standard drinks of alcohol   Drug use: No   Sexual activity: Not Currently  Other Topics Concern   Not on [name]  Social History Narrative   Married [name]   Limited activity due to back pain   Social Determinants of Health   Financial Resource Strain: Low Risk  (01/28/2021)   Overall Financial Resource Strain (CARDIA)    Difficulty of Paying Living Expenses: Not hard at all  Food Insecurity: No Food Insecurity (01/28/2021)   Hunger Vital Sign    Worried About Running Out of Food in the Last Year: Never true    Ran Out of Food in the Last Year: Never true  Transportation Needs: No  Transportation Needs (01/28/2021)   PRAPARE - Hydrologist (Medical): No    Lack of Transportation (Non-Medical): No  Physical Activity: Insufficiently Active (01/28/2021)   Exercise Vital Sign    Days of Exercise per Week: 3 days    Minutes of Exercise per Session: 20 min  Stress: No Stress Concern Present (01/28/2021)   [place]    Feeling of Stress : Not at all  Social Connections: Moderately Isolated (01/28/2021)   Social Connection and Isolation Panel [NHANES]    Frequency of Communication with Friends and Family: More than three times a week    Frequency of Social Gatherings with Friends and Family: More than three times a week    Attends Religious Services: Never    Marine scientist or Organizations: No    Attends Music therapist: Never    Marital Status: Married   Past Surgical History:  Procedure Laterality Date   APPENDECTOMY     BACK SURGERY     Spinal surgery   BRAIN SURGERY     2013 - stoke ( fall in 2010)    CARDIAC CATHETERIZATION  2006   LAD: 30%, RCA : 20%, normal EF, elevated LVEDP   CARDIAC CATHETERIZATION     CEREBRAL ANGIOGRAM  12/20/2015   COLONOSCOPY N/A 08/09/2018   Procedure: COLONOSCOPY;  Surgeon: Rogene Houston, MD;  Location: [facility];  Service: Endoscopy;  Laterality: N/A;  1:55   ESOPHAGEAL DILATION N/A 08/09/2018   Procedure: ESOPHAGEAL DILATION;  Surgeon: Rogene Houston, MD;  Location: [facility];  Service: Endoscopy;  Laterality: N/A;   ESOPHAGOGASTRODUODENOSCOPY N/A 08/09/2018   Procedure: ESOPHAGOGASTRODUODENOSCOPY (EGD);  Surgeon: Rogene Houston, MD;  Location: [facility];  Service: Endoscopy;  Laterality: N/A;   GIVENS CAPSULE STUDY N/A 08/25/2018   Procedure: GIVENS CAPSULE STUDY;  Surgeon: Rogene Houston, MD;  Location: [facility];  Service: Endoscopy;  Laterality: N/A;   IR ANGIO INTRA EXTRACRAN SEL COM  CAROTID INNOMINATE BILAT MOD SED  01/14/2018   IR ANGIO INTRA EXTRACRAN SEL COM CAROTID INNOMINATE BILAT MOD SED  05/11/2018   IR ANGIO VERTEBRAL SEL SUBCLAVIAN INNOMINATE UNI L MOD SED  01/14/2018   IR ANGIO VERTEBRAL SEL SUBCLAVIAN INNOMINATE UNI L MOD SED  05/11/2018   IR ANGIO VERTEBRAL SEL VERTEBRAL UNI R MOD SED  01/14/2018   IR ANGIO VERTEBRAL SEL VERTEBRAL UNI R MOD SED  05/11/2018   KNEE ARTHROSCOPY     left   LUMBAR DISC SURGERY     POLYPECTOMY  08/09/2018   Procedure: POLYPECTOMY;  Surgeon: Rogene Houston, MD;  Location: [facility];  Service: Endoscopy;;  ascending colon (CS x 2, HS x5) hepatic flexure (HSx1), recto-sigmoid (HSx1)   TONSILLECTOMY     TOTAL ABDOMINAL HYSTERECTOMY     menopause/bleeding   Past Medical History:  Diagnosis Date   Absolute anemia 07/28/2018   Active smoker    Allergy    Anxiety    Takes Xanax for anxiety   Arthritis    Asthma    Cataract    Chronic airway obstruction, not elsewhere classified    Clotting disorder ([place])    stoke    Depression    Diarrhea    Diverticulosis of colon (without mention of hemorrhage)    Esophageal reflux    Family history of malignant neoplasm of gastrointestinal tract    GI bleed    Headache(784.0)    Irregular   Neuromuscular disorder (HCC)    numbness is left hand and left elbow   Obesity, unspecified    Other and unspecified hyperlipidemia    Personal history of colonic polyps    Stricture and stenosis of esophagus    Stroke [facility])    Thyroid disease    Type II or unspecified type diabetes mellitus without mention of complication, not stated as uncontrolled    Unspecified asthma(493.90)    Unspecified essential hypertension    There were no vitals taken for this visit.  Opioid Risk Score:   Fall Risk Score:  `1  Depression screen [facility] 2/9     02/13/2022   10:28 AM 02/10/2022    8:37 AM 02/07/2022   10:12 AM 01/29/2022   10:37 AM 12/31/2021    1:56 PM 12/27/2021    1:27 PM 08/28/2021    9:42 AM   Depression screen PHQ 2/9  Decreased Interest '1 2 3 '$ 0 3 3 0  Down, Depressed, Hopeless '1 2 3 1 3 3 '$ 0  PHQ - 2 Score '2 4 6 1 6 6 '$ 0  Altered sleeping  0 0  1    Tired, decreased energy  0 0 1 3 0   Change in appetite  0 0 0 3 3   Feeling bad or failure about yourself   '1 1 3 3 3   '$ Trouble concentrating  0 0 0 3 3   Moving slowly or fidgety/restless  0 0 0 3 0   Suicidal thoughts  0 0 0 0 0   PHQ-9 Score  '5 7  22    '$ Difficult doing work/chores  Somewhat difficult Somewhat difficult Extremely dIfficult  Somewhat difficult     Review of Systems  Musculoskeletal:  Positive for gait problem.       Left shoulder pain and left arm pain, left hip pain  All other systems reviewed and are negative.      Objective:   Physical Exam Vitals and nursing note reviewed.  Constitutional:      Appearance: She is normal weight.  HENT:     Head: Normocephalic and atraumatic.  Eyes:     Extraocular Movements: Extraocular movements intact.     Conjunctiva/sclera: Conjunctivae normal.     Pupils: Pupils are equal, round, and reactive to light.  Musculoskeletal:     Comments: Tenderness palpation over the proximal humeral area.  Range of motion not tested due to movement restrictions.  No swelling in the left hand or wrist area.  Skin:    General: Skin is warm and dry.  Neurological:     Mental Status: She is alert and oriented to person, place, and time.     Comments: Could not perform manual muscle testing left upper extremity left lower extremity at 4/5 strength right-sided 5/5 strength Sensation intact to light touch in the left hand.  Left grip strength was 4/5.  Psychiatric:        Mood and Affect: Mood normal.        Behavior: Behavior normal.           Assessment & Plan:  1.  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  She was doing very well and has completed inpatient as well as home health therapy.  Unfortunately she fell sustaining a left proximal  comminuted humeral fracture.  At this time any stroke rehabilitation is on hold until left shoulder fracture heals.  She will follow-up with Dr. Aline Brochure from orthopedic surgery who will prescribe pain medication and provide care for the left shoulder fracture.  I will see her back in 3 months at which time she should be healed.  Reassess neuro rehab at that time.

## 2022-04-03 ENCOUNTER — Ambulatory Visit (INDEPENDENT_AMBULATORY_CARE_PROVIDER_SITE_OTHER): Payer: Medicare Other | Admitting: Orthopedic Surgery

## 2022-04-03 ENCOUNTER — Ambulatory Visit (INDEPENDENT_AMBULATORY_CARE_PROVIDER_SITE_OTHER): Payer: Medicare Other

## 2022-04-03 ENCOUNTER — Encounter: Payer: Self-pay | Admitting: Orthopedic Surgery

## 2022-04-03 DIAGNOSIS — S42295D Other nondisplaced fracture of upper end of left humerus, subsequent encounter for fracture with routine healing: Secondary | ICD-10-CM

## 2022-04-03 NOTE — Progress Notes (Signed)
Chief Complaint  Patient presents with   Shoulder Injury    Left 03/24/22   81 year old female injured July 17 proximal humerus fracture non to minimally displaced treated with sling  Patient has a history of heart disease diabetes thyroid disease previous stroke she is on a blood thinner (Plavix)  X-rays today show fracture stable  Continue sling  X-ray in a week

## 2022-04-07 DIAGNOSIS — R1310 Dysphagia, unspecified: Secondary | ICD-10-CM | POA: Diagnosis not present

## 2022-04-07 DIAGNOSIS — I10 Essential (primary) hypertension: Secondary | ICD-10-CM | POA: Diagnosis not present

## 2022-04-07 DIAGNOSIS — Z7984 Long term (current) use of oral hypoglycemic drugs: Secondary | ICD-10-CM | POA: Diagnosis not present

## 2022-04-07 DIAGNOSIS — I69354 Hemiplegia and hemiparesis following cerebral infarction affecting left non-dominant side: Secondary | ICD-10-CM | POA: Diagnosis not present

## 2022-04-07 DIAGNOSIS — K219 Gastro-esophageal reflux disease without esophagitis: Secondary | ICD-10-CM | POA: Diagnosis not present

## 2022-04-07 DIAGNOSIS — Z9181 History of falling: Secondary | ICD-10-CM | POA: Diagnosis not present

## 2022-04-07 DIAGNOSIS — E119 Type 2 diabetes mellitus without complications: Secondary | ICD-10-CM | POA: Diagnosis not present

## 2022-04-07 DIAGNOSIS — I69322 Dysarthria following cerebral infarction: Secondary | ICD-10-CM | POA: Diagnosis not present

## 2022-04-07 DIAGNOSIS — E785 Hyperlipidemia, unspecified: Secondary | ICD-10-CM | POA: Diagnosis not present

## 2022-04-07 DIAGNOSIS — Z7902 Long term (current) use of antithrombotics/antiplatelets: Secondary | ICD-10-CM | POA: Diagnosis not present

## 2022-04-07 DIAGNOSIS — J449 Chronic obstructive pulmonary disease, unspecified: Secondary | ICD-10-CM | POA: Diagnosis not present

## 2022-04-10 ENCOUNTER — Encounter: Payer: Self-pay | Admitting: Orthopedic Surgery

## 2022-04-10 ENCOUNTER — Ambulatory Visit (INDEPENDENT_AMBULATORY_CARE_PROVIDER_SITE_OTHER): Payer: Medicare Other

## 2022-04-10 ENCOUNTER — Ambulatory Visit (INDEPENDENT_AMBULATORY_CARE_PROVIDER_SITE_OTHER): Payer: Medicare Other | Admitting: Orthopedic Surgery

## 2022-04-10 DIAGNOSIS — S42295D Other nondisplaced fracture of upper end of left humerus, subsequent encounter for fracture with routine healing: Secondary | ICD-10-CM

## 2022-04-10 NOTE — Progress Notes (Signed)
Chief Complaint  Patient presents with   Shoulder Injury    Left 03/24/22     Fracture left shoulder proximal humerus 3-week x-ray  Patient's bruise is getting better on the skin x-ray looks good start physical therapy follow-up 6 weeks check range of motion

## 2022-04-11 NOTE — Addendum Note (Signed)
Addended byCandice Camp on: 04/11/2022 10:52 AM   Modules accepted: Orders

## 2022-04-11 NOTE — Addendum Note (Signed)
Addended by: Obie Dredge A on: 04/11/2022 08:51 AM   Modules accepted: Orders

## 2022-04-12 DIAGNOSIS — I6381 Other cerebral infarction due to occlusion or stenosis of small artery: Secondary | ICD-10-CM | POA: Diagnosis not present

## 2022-04-16 ENCOUNTER — Encounter: Payer: Medicare Other | Admitting: Orthopedic Surgery

## 2022-04-16 ENCOUNTER — Telehealth: Payer: Self-pay

## 2022-04-16 NOTE — Telephone Encounter (Signed)
Patient called asking about her PT. She asked if you would give her a call @ [phone] to discuss it.

## 2022-04-16 NOTE — Telephone Encounter (Signed)
I called her to see what she needs she said she has not heard anything about the therapy with [facility], I told her I will message them now, so I sent message to Elizabeth [name] with [employer]

## 2022-04-18 DIAGNOSIS — K219 Gastro-esophageal reflux disease without esophagitis: Secondary | ICD-10-CM | POA: Diagnosis not present

## 2022-04-18 DIAGNOSIS — Z9181 History of falling: Secondary | ICD-10-CM | POA: Diagnosis not present

## 2022-04-18 DIAGNOSIS — I69354 Hemiplegia and hemiparesis following cerebral infarction affecting left non-dominant side: Secondary | ICD-10-CM | POA: Diagnosis not present

## 2022-04-18 DIAGNOSIS — E785 Hyperlipidemia, unspecified: Secondary | ICD-10-CM | POA: Diagnosis not present

## 2022-04-18 DIAGNOSIS — E119 Type 2 diabetes mellitus without complications: Secondary | ICD-10-CM | POA: Diagnosis not present

## 2022-04-18 DIAGNOSIS — Z7902 Long term (current) use of antithrombotics/antiplatelets: Secondary | ICD-10-CM | POA: Diagnosis not present

## 2022-04-18 DIAGNOSIS — I10 Essential (primary) hypertension: Secondary | ICD-10-CM | POA: Diagnosis not present

## 2022-04-18 DIAGNOSIS — R1310 Dysphagia, unspecified: Secondary | ICD-10-CM | POA: Diagnosis not present

## 2022-04-18 DIAGNOSIS — I69322 Dysarthria following cerebral infarction: Secondary | ICD-10-CM | POA: Diagnosis not present

## 2022-04-18 DIAGNOSIS — J449 Chronic obstructive pulmonary disease, unspecified: Secondary | ICD-10-CM | POA: Diagnosis not present

## 2022-04-18 DIAGNOSIS — Z7984 Long term (current) use of oral hypoglycemic drugs: Secondary | ICD-10-CM | POA: Diagnosis not present

## 2022-04-22 ENCOUNTER — Other Ambulatory Visit: Payer: Self-pay | Admitting: Orthopedic Surgery

## 2022-04-25 DIAGNOSIS — K219 Gastro-esophageal reflux disease without esophagitis: Secondary | ICD-10-CM | POA: Diagnosis not present

## 2022-04-25 DIAGNOSIS — E785 Hyperlipidemia, unspecified: Secondary | ICD-10-CM | POA: Diagnosis not present

## 2022-04-25 DIAGNOSIS — I10 Essential (primary) hypertension: Secondary | ICD-10-CM | POA: Diagnosis not present

## 2022-04-25 DIAGNOSIS — I69354 Hemiplegia and hemiparesis following cerebral infarction affecting left non-dominant side: Secondary | ICD-10-CM | POA: Diagnosis not present

## 2022-04-25 DIAGNOSIS — Z7902 Long term (current) use of antithrombotics/antiplatelets: Secondary | ICD-10-CM | POA: Diagnosis not present

## 2022-04-25 DIAGNOSIS — J449 Chronic obstructive pulmonary disease, unspecified: Secondary | ICD-10-CM | POA: Diagnosis not present

## 2022-04-25 DIAGNOSIS — E119 Type 2 diabetes mellitus without complications: Secondary | ICD-10-CM | POA: Diagnosis not present

## 2022-04-25 DIAGNOSIS — Z9181 History of falling: Secondary | ICD-10-CM | POA: Diagnosis not present

## 2022-04-25 DIAGNOSIS — Z7984 Long term (current) use of oral hypoglycemic drugs: Secondary | ICD-10-CM | POA: Diagnosis not present

## 2022-04-25 DIAGNOSIS — R1310 Dysphagia, unspecified: Secondary | ICD-10-CM | POA: Diagnosis not present

## 2022-04-28 ENCOUNTER — Other Ambulatory Visit: Payer: Self-pay | Admitting: Radiology

## 2022-04-28 DIAGNOSIS — J449 Chronic obstructive pulmonary disease, unspecified: Secondary | ICD-10-CM | POA: Diagnosis not present

## 2022-04-28 DIAGNOSIS — Z9181 History of falling: Secondary | ICD-10-CM | POA: Diagnosis not present

## 2022-04-28 DIAGNOSIS — Z7902 Long term (current) use of antithrombotics/antiplatelets: Secondary | ICD-10-CM | POA: Diagnosis not present

## 2022-04-28 DIAGNOSIS — E119 Type 2 diabetes mellitus without complications: Secondary | ICD-10-CM | POA: Diagnosis not present

## 2022-04-28 DIAGNOSIS — I69354 Hemiplegia and hemiparesis following cerebral infarction affecting left non-dominant side: Secondary | ICD-10-CM | POA: Diagnosis not present

## 2022-04-28 DIAGNOSIS — E785 Hyperlipidemia, unspecified: Secondary | ICD-10-CM | POA: Diagnosis not present

## 2022-04-28 DIAGNOSIS — Z7984 Long term (current) use of oral hypoglycemic drugs: Secondary | ICD-10-CM | POA: Diagnosis not present

## 2022-04-28 DIAGNOSIS — K219 Gastro-esophageal reflux disease without esophagitis: Secondary | ICD-10-CM | POA: Diagnosis not present

## 2022-04-28 DIAGNOSIS — R1310 Dysphagia, unspecified: Secondary | ICD-10-CM | POA: Diagnosis not present

## 2022-04-28 DIAGNOSIS — I10 Essential (primary) hypertension: Secondary | ICD-10-CM | POA: Diagnosis not present

## 2022-04-28 NOTE — Telephone Encounter (Signed)
Refill request received via fax for Tramadol

## 2022-04-29 MED ORDER — TRAMADOL HCL 50 MG PO TABS: 50.0000 mg | ORAL_TABLET | Freq: Four times a day (QID) | ORAL | 0 refills | Status: DC | PRN

## 2022-04-30 DIAGNOSIS — Z7902 Long term (current) use of antithrombotics/antiplatelets: Secondary | ICD-10-CM | POA: Diagnosis not present

## 2022-04-30 DIAGNOSIS — Z7984 Long term (current) use of oral hypoglycemic drugs: Secondary | ICD-10-CM | POA: Diagnosis not present

## 2022-04-30 DIAGNOSIS — Z9181 History of falling: Secondary | ICD-10-CM | POA: Diagnosis not present

## 2022-04-30 DIAGNOSIS — J449 Chronic obstructive pulmonary disease, unspecified: Secondary | ICD-10-CM | POA: Diagnosis not present

## 2022-04-30 DIAGNOSIS — R1310 Dysphagia, unspecified: Secondary | ICD-10-CM | POA: Diagnosis not present

## 2022-04-30 DIAGNOSIS — E785 Hyperlipidemia, unspecified: Secondary | ICD-10-CM | POA: Diagnosis not present

## 2022-04-30 DIAGNOSIS — I69354 Hemiplegia and hemiparesis following cerebral infarction affecting left non-dominant side: Secondary | ICD-10-CM | POA: Diagnosis not present

## 2022-04-30 DIAGNOSIS — K219 Gastro-esophageal reflux disease without esophagitis: Secondary | ICD-10-CM | POA: Diagnosis not present

## 2022-04-30 DIAGNOSIS — I10 Essential (primary) hypertension: Secondary | ICD-10-CM | POA: Diagnosis not present

## 2022-04-30 DIAGNOSIS — E119 Type 2 diabetes mellitus without complications: Secondary | ICD-10-CM | POA: Diagnosis not present

## 2022-05-05 ENCOUNTER — Telehealth: Payer: Self-pay

## 2022-05-05 DIAGNOSIS — I69354 Hemiplegia and hemiparesis following cerebral infarction affecting left non-dominant side: Secondary | ICD-10-CM | POA: Diagnosis not present

## 2022-05-05 DIAGNOSIS — I10 Essential (primary) hypertension: Secondary | ICD-10-CM | POA: Diagnosis not present

## 2022-05-05 DIAGNOSIS — E785 Hyperlipidemia, unspecified: Secondary | ICD-10-CM | POA: Diagnosis not present

## 2022-05-05 DIAGNOSIS — J449 Chronic obstructive pulmonary disease, unspecified: Secondary | ICD-10-CM | POA: Diagnosis not present

## 2022-05-05 DIAGNOSIS — K219 Gastro-esophageal reflux disease without esophagitis: Secondary | ICD-10-CM | POA: Diagnosis not present

## 2022-05-05 DIAGNOSIS — R1310 Dysphagia, unspecified: Secondary | ICD-10-CM | POA: Diagnosis not present

## 2022-05-05 DIAGNOSIS — Z7902 Long term (current) use of antithrombotics/antiplatelets: Secondary | ICD-10-CM | POA: Diagnosis not present

## 2022-05-05 DIAGNOSIS — E119 Type 2 diabetes mellitus without complications: Secondary | ICD-10-CM | POA: Diagnosis not present

## 2022-05-05 DIAGNOSIS — Z7984 Long term (current) use of oral hypoglycemic drugs: Secondary | ICD-10-CM | POA: Diagnosis not present

## 2022-05-05 DIAGNOSIS — Z9181 History of falling: Secondary | ICD-10-CM | POA: Diagnosis not present

## 2022-05-05 NOTE — Telephone Encounter (Signed)
[facility] Physical Therapist called in today to report a fall that the patient had recently that resulted in her bleeding in the back of her head and she was educated to go to the ER to check and make sure there wasn't any brain bleed.

## 2022-05-09 DIAGNOSIS — Z7902 Long term (current) use of antithrombotics/antiplatelets: Secondary | ICD-10-CM | POA: Diagnosis not present

## 2022-05-09 DIAGNOSIS — Z7984 Long term (current) use of oral hypoglycemic drugs: Secondary | ICD-10-CM | POA: Diagnosis not present

## 2022-05-09 DIAGNOSIS — E119 Type 2 diabetes mellitus without complications: Secondary | ICD-10-CM | POA: Diagnosis not present

## 2022-05-09 DIAGNOSIS — J449 Chronic obstructive pulmonary disease, unspecified: Secondary | ICD-10-CM | POA: Diagnosis not present

## 2022-05-09 DIAGNOSIS — I10 Essential (primary) hypertension: Secondary | ICD-10-CM | POA: Diagnosis not present

## 2022-05-09 DIAGNOSIS — I69354 Hemiplegia and hemiparesis following cerebral infarction affecting left non-dominant side: Secondary | ICD-10-CM | POA: Diagnosis not present

## 2022-05-09 DIAGNOSIS — Z9181 History of falling: Secondary | ICD-10-CM | POA: Diagnosis not present

## 2022-05-09 DIAGNOSIS — K219 Gastro-esophageal reflux disease without esophagitis: Secondary | ICD-10-CM | POA: Diagnosis not present

## 2022-05-09 DIAGNOSIS — R1310 Dysphagia, unspecified: Secondary | ICD-10-CM | POA: Diagnosis not present

## 2022-05-09 DIAGNOSIS — E785 Hyperlipidemia, unspecified: Secondary | ICD-10-CM | POA: Diagnosis not present

## 2022-05-13 DIAGNOSIS — R1310 Dysphagia, unspecified: Secondary | ICD-10-CM | POA: Diagnosis not present

## 2022-05-13 DIAGNOSIS — Z7902 Long term (current) use of antithrombotics/antiplatelets: Secondary | ICD-10-CM | POA: Diagnosis not present

## 2022-05-13 DIAGNOSIS — Z7984 Long term (current) use of oral hypoglycemic drugs: Secondary | ICD-10-CM | POA: Diagnosis not present

## 2022-05-13 DIAGNOSIS — K219 Gastro-esophageal reflux disease without esophagitis: Secondary | ICD-10-CM | POA: Diagnosis not present

## 2022-05-13 DIAGNOSIS — I6381 Other cerebral infarction due to occlusion or stenosis of small artery: Secondary | ICD-10-CM | POA: Diagnosis not present

## 2022-05-13 DIAGNOSIS — J449 Chronic obstructive pulmonary disease, unspecified: Secondary | ICD-10-CM | POA: Diagnosis not present

## 2022-05-13 DIAGNOSIS — I10 Essential (primary) hypertension: Secondary | ICD-10-CM | POA: Diagnosis not present

## 2022-05-13 DIAGNOSIS — I69354 Hemiplegia and hemiparesis following cerebral infarction affecting left non-dominant side: Secondary | ICD-10-CM | POA: Diagnosis not present

## 2022-05-13 DIAGNOSIS — E785 Hyperlipidemia, unspecified: Secondary | ICD-10-CM | POA: Diagnosis not present

## 2022-05-13 DIAGNOSIS — E119 Type 2 diabetes mellitus without complications: Secondary | ICD-10-CM | POA: Diagnosis not present

## 2022-05-13 DIAGNOSIS — Z9181 History of falling: Secondary | ICD-10-CM | POA: Diagnosis not present

## 2022-05-16 DIAGNOSIS — E119 Type 2 diabetes mellitus without complications: Secondary | ICD-10-CM | POA: Diagnosis not present

## 2022-05-16 DIAGNOSIS — R1310 Dysphagia, unspecified: Secondary | ICD-10-CM | POA: Diagnosis not present

## 2022-05-16 DIAGNOSIS — Z9181 History of falling: Secondary | ICD-10-CM | POA: Diagnosis not present

## 2022-05-16 DIAGNOSIS — I69354 Hemiplegia and hemiparesis following cerebral infarction affecting left non-dominant side: Secondary | ICD-10-CM | POA: Diagnosis not present

## 2022-05-16 DIAGNOSIS — Z7902 Long term (current) use of antithrombotics/antiplatelets: Secondary | ICD-10-CM | POA: Diagnosis not present

## 2022-05-16 DIAGNOSIS — K219 Gastro-esophageal reflux disease without esophagitis: Secondary | ICD-10-CM | POA: Diagnosis not present

## 2022-05-16 DIAGNOSIS — E785 Hyperlipidemia, unspecified: Secondary | ICD-10-CM | POA: Diagnosis not present

## 2022-05-16 DIAGNOSIS — Z7984 Long term (current) use of oral hypoglycemic drugs: Secondary | ICD-10-CM | POA: Diagnosis not present

## 2022-05-16 DIAGNOSIS — J449 Chronic obstructive pulmonary disease, unspecified: Secondary | ICD-10-CM | POA: Diagnosis not present

## 2022-05-16 DIAGNOSIS — I10 Essential (primary) hypertension: Secondary | ICD-10-CM | POA: Diagnosis not present

## 2022-05-20 DIAGNOSIS — K219 Gastro-esophageal reflux disease without esophagitis: Secondary | ICD-10-CM | POA: Diagnosis not present

## 2022-05-20 DIAGNOSIS — Z9181 History of falling: Secondary | ICD-10-CM | POA: Diagnosis not present

## 2022-05-20 DIAGNOSIS — E785 Hyperlipidemia, unspecified: Secondary | ICD-10-CM | POA: Diagnosis not present

## 2022-05-20 DIAGNOSIS — I69354 Hemiplegia and hemiparesis following cerebral infarction affecting left non-dominant side: Secondary | ICD-10-CM | POA: Diagnosis not present

## 2022-05-20 DIAGNOSIS — I10 Essential (primary) hypertension: Secondary | ICD-10-CM | POA: Diagnosis not present

## 2022-05-20 DIAGNOSIS — R1310 Dysphagia, unspecified: Secondary | ICD-10-CM | POA: Diagnosis not present

## 2022-05-20 DIAGNOSIS — E119 Type 2 diabetes mellitus without complications: Secondary | ICD-10-CM | POA: Diagnosis not present

## 2022-05-20 DIAGNOSIS — J449 Chronic obstructive pulmonary disease, unspecified: Secondary | ICD-10-CM | POA: Diagnosis not present

## 2022-05-20 DIAGNOSIS — Z7984 Long term (current) use of oral hypoglycemic drugs: Secondary | ICD-10-CM | POA: Diagnosis not present

## 2022-05-20 DIAGNOSIS — Z7902 Long term (current) use of antithrombotics/antiplatelets: Secondary | ICD-10-CM | POA: Diagnosis not present

## 2022-05-22 ENCOUNTER — Encounter: Payer: Self-pay | Admitting: Orthopedic Surgery

## 2022-05-22 ENCOUNTER — Ambulatory Visit (INDEPENDENT_AMBULATORY_CARE_PROVIDER_SITE_OTHER): Payer: Medicare Other | Admitting: Orthopedic Surgery

## 2022-05-22 DIAGNOSIS — S42295D Other nondisplaced fracture of upper end of left humerus, subsequent encounter for fracture with routine healing: Secondary | ICD-10-CM

## 2022-05-22 MED ORDER — ONDANSETRON HCL 4 MG PO TABS: 4.0000 mg | ORAL_TABLET | Freq: Four times a day (QID) | ORAL | 1 refills | Status: DC

## 2022-05-22 NOTE — Progress Notes (Signed)
Chief Complaint  Patient presents with   Shoulder Injury    LT/fx care DOI 03/24/22   Left proximal humerus fracture follow-up  Passive range of motion at 120 degrees of flexion and abduction 90 external rotation at 0 degrees abduction is 45 degrees  Active range of motion shows cuff weakness  Recommend physical therapy continue  Patient planing of nausea requested prescription for Zofran which was refilled  Meds ordered this encounter  Medications   ondansetron (ZOFRAN) 4 MG tablet    Sig: Take 1 tablet (4 mg total) by mouth every 6 (six) hours.    Dispense:  60 tablet    Refill:  1

## 2022-05-26 ENCOUNTER — Telehealth: Payer: Self-pay | Admitting: Nurse Practitioner

## 2022-05-26 NOTE — Telephone Encounter (Signed)
Spoke with pt and it haws been 3 days since BM and was wanting to take stool softner. I advised her this was ok and this was otc and she could take daily and (also we had samples) that her provider would be back tomorrow. She said she normally gets stopped up and has to take a laxative to get unstopped. I advised her she may need to start taking something daily to soften the stool so this doesn't keep happening. Please advise

## 2022-05-26 NOTE — Telephone Encounter (Signed)
Pt called stating she is having hard stool and is wanting to know if she can get a stool softner?   [facility]

## 2022-05-27 ENCOUNTER — Ambulatory Visit (INDEPENDENT_AMBULATORY_CARE_PROVIDER_SITE_OTHER): Payer: Medicare Other | Admitting: Gastroenterology

## 2022-05-27 ENCOUNTER — Encounter (INDEPENDENT_AMBULATORY_CARE_PROVIDER_SITE_OTHER): Payer: Self-pay

## 2022-05-27 ENCOUNTER — Encounter (INDEPENDENT_AMBULATORY_CARE_PROVIDER_SITE_OTHER): Payer: Self-pay | Admitting: Gastroenterology

## 2022-05-27 ENCOUNTER — Telehealth: Payer: Self-pay

## 2022-05-27 ENCOUNTER — Telehealth (INDEPENDENT_AMBULATORY_CARE_PROVIDER_SITE_OTHER): Payer: Self-pay

## 2022-05-27 ENCOUNTER — Other Ambulatory Visit: Payer: Self-pay

## 2022-05-27 ENCOUNTER — Other Ambulatory Visit (INDEPENDENT_AMBULATORY_CARE_PROVIDER_SITE_OTHER): Payer: Self-pay

## 2022-05-27 VITALS — BP 137/82 | HR 102 | Temp 98.3°F | Ht 62.0 in | Wt 115.5 lb

## 2022-05-27 DIAGNOSIS — K7400 Hepatic fibrosis, unspecified: Secondary | ICD-10-CM

## 2022-05-27 DIAGNOSIS — K743 Primary biliary cirrhosis: Secondary | ICD-10-CM

## 2022-05-27 DIAGNOSIS — K5900 Constipation, unspecified: Secondary | ICD-10-CM

## 2022-05-27 DIAGNOSIS — Z86010 Personal history of colonic polyps: Secondary | ICD-10-CM

## 2022-05-27 DIAGNOSIS — S42295D Other nondisplaced fracture of upper end of left humerus, subsequent encounter for fracture with routine healing: Secondary | ICD-10-CM

## 2022-05-27 MED ORDER — PEG 3350-KCL-NA BICARB-NACL 420 G PO SOLR: 4000.0000 mL | ORAL | 0 refills | Status: DC

## 2022-05-27 MED ORDER — DOCUSATE SODIUM 100 MG PO CAPS: 100.0000 mg | ORAL_CAPSULE | Freq: Every day | ORAL | 0 refills | Status: DC

## 2022-05-27 NOTE — Telephone Encounter (Signed)
Patient left message asking if Dr. Aline Brochure has ordered her anymore PT. She is asking for a return call at [phone]

## 2022-05-27 NOTE — Telephone Encounter (Signed)
Delitha Elms Ann Eliannah Hinde, CMA  ?

## 2022-05-27 NOTE — Patient Instructions (Addendum)
Please increase water intake. Start taking 1 capful of miralax every 12 hours. If after 2-3 days, there is no improvement increase to 1 capful every 8 hours. We will get you scheduled for colonoscopy  Please make sure diet is high in fruits, veggies and whole grains.  You also had labs ordered in regards to your liver in April that were not completed, please try to complete these prior to your next visit  You have follow up scheduled for October with Dr. Jenetta Downer

## 2022-05-27 NOTE — Progress Notes (Signed)
Referring Provider: Renee Rival, FNP Primary Care Physician:  Renee Rival, FNP Primary GI Physician: Jenetta Downer   Chief Complaint  Patient presents with   Abdominal Pain    Having lower abdominal pain and constipation. Last BM this morning and it was hard. Took three colace yesterday. Weight loss. 2 months ago weight was 145 lbs. Today 115.5 lb. Reports appetite is not good. Has abdominal pain after eating.      HPI:   Elizabeth [name] is a 81 y.o. 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 Continued with Ursodiol '300mg'$  TID, liver elastography, and labs ordered. Repeat colonoscopy discussed with patient and family who preferred to discuss proceeding with updating TCS further.   Labs were not completed but elastography done 01/13/22 with median kPa 5.8, recommended repeat in 2 years.   Present: Today patient arrives with husband, She reports for the past month or so she has had very hard, large stools and stool balls, having a BM only once per week. Sometimes having to "dig the stool out" She is taking stool softener once a day without much relief, has not tried anything else for her constipation. She has some nausea but no vomiting. Denies rectal bleeding or melena. Reports that she is not eating great, States she feels hungry but is concerned about eating due to ongoing constipation. Sometimes has some LLQ pain that improves with a BM.  She is taking tramadol maybe twice per week for a headache. She denies any other new medications. Husband states that she does not drink much water at all, mostly sodas.   Last Colonoscopy:2019 - Six 5 to 15 mm polyps in the ascending colon, removed with  a hot snare. Complete resection. Partial retrieval. - One 8 mm polyp at the hepatic flexure - One small polyp in the ascending colon - One 12 mm polyp at the recto-sigmoid colon - Diverticulosis in the sigmoid colon. Last Endoscopy: 08/2018 - Normal esophagus. - Z-line irregular, 37 cm from the incisors. - 3 cm hiatal hernia. - Normal stomach. - Normal duodenal bulb and second portion of the duodenum. - No endoscopic esophageal abnormality to explain patient's dysphagia. Esophagus dilated. Dilated. - No specimens collected.  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    Depression    Diarrhea    Diverticulosis of colon (without mention of hemorrhage)    Esophageal reflux    Family history of malignant neoplasm of gastrointestinal tract    GI bleed    Headache(784.0)    Irregular   Neuromuscular disorder (HCC)    numbness is left hand and left elbow   Obesity, unspecified    Other and unspecified hyperlipidemia    Personal history of colonic polyps    Stricture and stenosis of esophagus    Stroke [facility])    Thyroid disease    Type II or unspecified type diabetes mellitus without mention of complication, not  stated as uncontrolled    Unspecified asthma(493.90)    Unspecified essential hypertension     Past Surgical History:  Procedure Laterality Date   APPENDECTOMY     BACK SURGERY     Spinal surgery   BRAIN SURGERY     2013 - stoke ( fall in 2010)    CARDIAC CATHETERIZATION  2006   LAD: 30%, RCA : 20%, normal EF, elevated LVEDP   CARDIAC CATHETERIZATION     CEREBRAL ANGIOGRAM  12/20/2015   COLONOSCOPY N/A 08/09/2018   Procedure: COLONOSCOPY;  Surgeon: Rogene Houston, MD;  [facility];  Service: Endoscopy;   Laterality: N/A;  1:55   ESOPHAGEAL DILATION N/A 08/09/2018   Procedure: ESOPHAGEAL DILATION;  Surgeon: Rogene Houston, MD;  [facility]: [facility];  Service: Endoscopy;  Laterality: N/A;   ESOPHAGOGASTRODUODENOSCOPY N/A 08/09/2018   Procedure: ESOPHAGOGASTRODUODENOSCOPY (EGD);  Surgeon: Rogene Houston, MD;  [facility];  Service: Endoscopy;  Laterality: N/A;   GIVENS CAPSULE STUDY N/A 08/25/2018   Procedure: GIVENS CAPSULE STUDY;  Surgeon: Rogene Houston, MD;  [facility];  Service: Endoscopy;  Laterality: N/A;   IR ANGIO INTRA EXTRACRAN SEL COM CAROTID INNOMINATE BILAT MOD SED  01/14/2018   IR ANGIO INTRA EXTRACRAN SEL COM CAROTID INNOMINATE BILAT MOD SED  05/11/2018   IR ANGIO VERTEBRAL SEL SUBCLAVIAN INNOMINATE UNI L MOD SED  01/14/2018   IR ANGIO VERTEBRAL SEL SUBCLAVIAN INNOMINATE UNI L MOD SED  05/11/2018   IR ANGIO VERTEBRAL SEL VERTEBRAL UNI R MOD SED  01/14/2018   IR ANGIO VERTEBRAL SEL VERTEBRAL UNI R MOD SED  05/11/2018   KNEE ARTHROSCOPY     left   LUMBAR DISC SURGERY     POLYPECTOMY  08/09/2018   Procedure: POLYPECTOMY;  Surgeon: Rogene Houston, MD;  [facility];  Service: Endoscopy;;  ascending colon (CS x 2, HS x5) hepatic flexure (HSx1), recto-sigmoid (HSx1)   TONSILLECTOMY     TOTAL ABDOMINAL HYSTERECTOMY     menopause/bleeding    Current Outpatient Medications  Medication Sig Dispense Refill   albuterol (VENTOLIN HFA) 108 (90 Base) MCG/ACT inhaler INHALE 2 PUFFS EVERY SIX HOURS AS NEEDED FOR WHEEZING OR SHORTNESS OF BREATH 8.5 g 2   amLODipine (NORVASC) 10 MG tablet TAKE 1 TABLET BY MOUTH EVERY DAY (Patient taking differently: Take 10 mg by mouth daily.) 90 tablet 5   azelastine (ASTELIN) 0.1 % nasal spray Place 2 sprays into both nostrils 2 (two) times daily. Use in each nostril as directed 30 mL 12   clopidogrel (PLAVIX) 75 MG tablet Take 1 tablet (75 mg total) by mouth daily. 90 tablet 1   empagliflozin (JARDIANCE) 10 MG TABS tablet  Take 1 tablet (10 mg total) by mouth daily before breakfast. 14 tablet 0   Evolocumab (REPATHA SURECLICK) 408 MG/ML SOAJ Inject 1 mL into the skin every 14 (fourteen) days. 6 mL 3   Grape Seed OIL by Does not apply route daily at 6 (six) AM.     meclizine (ANTIVERT) 12.5 MG tablet Take 1 tablet (12.5 mg total) by mouth 3 (three) times daily as needed for dizziness. 15 tablet 0   methocarbamol (ROBAXIN) 500 MG tablet Take 1 tablet (500 mg total) by mouth every 6 (six) hours as needed for muscle spasms. 30 tablet 0   montelukast (SINGULAIR) 10 MG tablet TAKE 1 TABLET BY MOUTH AT BEDTIME 30 tablet 3   ondansetron (ZOFRAN) 4 MG tablet Take 1  tablet (4 mg total) by mouth every 6 (six) hours. 60 tablet 1   OVER THE COUNTER MEDICATION daily at 6 (six) AM. brain essense     oxybutynin (DITROPAN XL) 15 MG 24 hr tablet 1 tablet DAILY (route: oral)     pantoprazole (PROTONIX) 40 MG tablet TAKE 1 TABLET BY MOUTH DAILY BEFORE BREAKFAST 90 tablet 1   traMADol (ULTRAM) 50 MG tablet Take 1 tablet (50 mg total) by mouth every 6 (six) hours as needed. 20 tablet 0   ursodiol (ACTIGALL) 300 MG capsule Take 1 capsule (300 mg total) by mouth 3 (three) times daily. 270 capsule 3   No current facility-administered medications for this visit.    Allergies as of 05/27/2022 - Review Complete 05/22/2022  Allergen Reaction Noted   Banana Anaphylaxis 04/08/2011   Codeine Nausea And Vomiting and Other (See Comments) 08/28/2008   Stevia [stevioside] Swelling and Other (See Comments) 04/02/2016   Actos [pioglitazone] Swelling 12/20/2012   Banana flavor  11/08/2021   Benicar [olmesartan medoxomil] Other (See Comments) 11/14/2011   Benicar [olmesartan]  02/19/2022   Chantix [varenicline] Other (See Comments) 12/20/2012   Diovan [valsartan] Other (See Comments) 11/14/2011   Lipitor [atorvastatin] Other (See Comments) 12/20/2012   Rice Other (See Comments) 12/02/2021   Spinach Other (See Comments) 02/27/2012   Statins  Other (See Comments) 01/30/2022   Vicodin [hydrocodone-acetaminophen] Nausea And Vomiting 12/20/2012   Zocor [simvastatin] Other (See Comments) 12/20/2012   Ace inhibitors Cough 11/14/2011   Celebrex [celecoxib] Rash 12/20/2012   Latex Rash and Other (See Comments)    Metformin and related  02/03/2019   Mushroom extract complex Palpitations 12/02/2021   Penicillins Rash and Other (See Comments)    Sulfonamide derivatives Rash     Family History  Problem Relation Age of Onset   Diabetes Mother    Heart disease Mother    Asthma Mother    Kidney disease Mother    Drug abuse Mother    Hypertension Mother    Stroke Mother        fall -several bleed on brain 53   Early death Father        trauma   Colon cancer Other        first cousin, paternal aunt and uncle   Cancer Paternal Grandmother        gastric   Diabetes Paternal Grandmother    Mental illness Paternal Grandmother    Heart disease Maternal Grandmother    Thyroid cancer Other        aunt   Heart disease Sister    Mental illness Sister    Heart attack Sister    Heart disease Brother    Lung disease Brother    Cancer Sister        pancreatic   Diabetes Sister    Hypertension Sister    Tuberculosis Maternal Grandfather    Heart disease Paternal Grandfather     Social History   Socioeconomic History   Marital status: Married    Spouse name: [name]   Number of children: 0   Years of education: 16   Highest education level: Bachelor's degree (e.g., BA, AB, BS)  Occupational History   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   Tobacco Use   Smoking status: Every Day    Types: E-cigarettes    Passive exposure: Current   Smokeless tobacco: Never  Vaping Use   Vaping Use: Some days   Substances: THC,  CBD, Flavoring, Nicotine-salt, Synthetic cannabinoids, Mixture of cannabinoids, Homemade substance  Substance and Sexual Activity   Alcohol use: No    Alcohol/week: 0.0 standard drinks of  alcohol   Drug use: No   Sexual activity: Not Currently  Other Topics Concern   Not on file  Social History Narrative   Married [name]   Limited activity due to back pain   Social Determinants of Health   Financial Resource Strain: Low Risk  (01/28/2021)   Overall Financial Resource Strain (CARDIA)    Difficulty of Paying Living Expenses: Not hard at all  Food Insecurity: No Food Insecurity (01/28/2021)   Hunger Vital Sign    Worried About Running Out of Food in the Last Year: Never true    Ran Out of Food in the Last Year: Never true  Transportation Needs: No Transportation Needs (01/28/2021)   PRAPARE - Hydrologist (Medical): No    Lack of Transportation (Non-Medical): No  Physical Activity: Insufficiently Active (01/28/2021)   Exercise Vital Sign    Days of Exercise per Week: 3 days    Minutes of Exercise per Session: 20 min  Stress: No Stress Concern Present (01/28/2021)   [place]    Feeling of Stress : Not at all  Social Connections: Moderately Isolated (01/28/2021)   Social Connection and Isolation Panel [NHANES]    Frequency of Communication with Friends and Family: More than three times a week    Frequency of Social Gatherings with Friends and Family: More than three times a week    Attends Religious Services: Never    Marine scientist or Organizations: No    Attends Music therapist: Never    Marital Status: Married   Review of systems General: negative for malaise, night sweats, fever, chills, +weight loss Neck: Negative for lumps, goiter, pain and significant neck swelling Resp: Negative for cough, wheezing, dyspnea at rest CV: Negative for chest pain, leg swelling, palpitations, orthopnea GI: denies melena, hematochezia,  vomiting, diarrhea, dysphagia, odyonophagia, early satiety or unintentional weight loss. +nausea +constipation  MSK: Negative for  joint pain or swelling, back pain, and muscle pain. Derm: Negative for itching or rash Psych: Denies depression, anxiety, memory loss, confusion. No homicidal or suicidal ideation.  Heme: Negative for prolonged bleeding, bruising easily, and swollen nodes. Endocrine: Negative for cold or heat intolerance, polyuria, polydipsia and goiter. Neuro: negative for tremor, gait imbalance, syncope and seizures. The remainder of the review of systems is noncontributory.  Physical Exam: BP 137/82 (BP Location: Right Arm, Patient Position: Sitting, Cuff Size: Normal)   Pulse (!) 102   Temp 98.3 F (36.8 C) (Oral)   Ht '5\' 2"'$  (1.575 m)   Wt 115 lb 8 oz (52.4 kg)   BMI 21.13 kg/m  General:   Alert and oriented. No distress noted. Pleasant and cooperative. In wheelchair Head:  Normocephalic and atraumatic. Eyes:  Conjuctiva clear without scleral icterus. Mouth:  Oral mucosa pink and moist. Good dentition. No lesions. Heart: Normal rate and rhythm, s1 and s2 heart sounds present.  Lungs: Clear lung sounds in all lobes. Respirations equal and unlabored. Abdomen:  +BS, soft, non-tender and non-distended. No rebound or guarding. No HSM or masses noted. Derm: No palmar erythema or jaundice Msk:  Symmetrical without gross deformities. Normal posture. Extremities:  Without edema. Neurologic:  Alert and  oriented x4 Psych:  Alert and cooperative. Normal mood and affect.  Invalid input(s): "  6 MONTHS"   ASSESSMENT: Elizabeth [name] is a 81 y.o. female presenting today for constipation.  Having very large, hard stools for the past month or two. Taking stool softener daily without much relief. 1 BM per week and sometimes having to manually evacuate stool. She is notably on tramadol and takes this a couple of times per week. She does not drink much water, mostly just sodas. Having some nausea and decreased food intake due to concerns for worsening constipation. No vomiting. Abdominal exam unremarkable.  Has had  approx 30 pounds of weight loss over the past 2 months. Suspect weight loss likely secondary to stool burden with poor food intake,  I encouraged her to increase water and fiber intake, will start miralax 17g q12h x2-3 days, can increase to 17g q8h if no improvement. I did make patient and her husband aware that tramadol use is likely contributing to her constipation even in small doses.  last colonoscopy was in 2019 with multiple polyps, recommended repeat in 6 months but this was not done, patient wishes to go ahead and proceed with updating colonoscopy. Indications, risks and benefits of procedure discussed in detail with patient. Patient verbalized understanding and is in agreement to proceed with Colonoscopy at this time.   PLAN:  Start miralax 1 capful q8h, if no improvement after 2-3 days, increase to 1 capful q8h 2. Colonoscopy, 2 day prep-ASA III  3. Increase water intake 4. Increase fiber in diet  5. Complete MELD labs, AFP and INR from last visit  All questions were answered, patient verbalized understanding and is in agreement with plan as outlined above.    Follow Up: Has upcoming follow up for PBC in October  Bliss Behnke L. Alver Sorrow, MSN, APRN, AGNP-C Adult-Gerontology Nurse Practitioner [facility]

## 2022-05-27 NOTE — Telephone Encounter (Signed)
Called to let pt know she could have samples of colace and she was at the GI dr

## 2022-05-28 ENCOUNTER — Encounter (INDEPENDENT_AMBULATORY_CARE_PROVIDER_SITE_OTHER): Payer: Self-pay

## 2022-05-29 DIAGNOSIS — J449 Chronic obstructive pulmonary disease, unspecified: Secondary | ICD-10-CM | POA: Diagnosis not present

## 2022-05-29 DIAGNOSIS — E785 Hyperlipidemia, unspecified: Secondary | ICD-10-CM | POA: Diagnosis not present

## 2022-05-29 DIAGNOSIS — I10 Essential (primary) hypertension: Secondary | ICD-10-CM | POA: Diagnosis not present

## 2022-05-29 DIAGNOSIS — Z7984 Long term (current) use of oral hypoglycemic drugs: Secondary | ICD-10-CM | POA: Diagnosis not present

## 2022-05-29 DIAGNOSIS — I69354 Hemiplegia and hemiparesis following cerebral infarction affecting left non-dominant side: Secondary | ICD-10-CM | POA: Diagnosis not present

## 2022-05-29 DIAGNOSIS — R1310 Dysphagia, unspecified: Secondary | ICD-10-CM | POA: Diagnosis not present

## 2022-05-29 DIAGNOSIS — E119 Type 2 diabetes mellitus without complications: Secondary | ICD-10-CM | POA: Diagnosis not present

## 2022-05-29 DIAGNOSIS — Z9181 History of falling: Secondary | ICD-10-CM | POA: Diagnosis not present

## 2022-05-29 DIAGNOSIS — K219 Gastro-esophageal reflux disease without esophagitis: Secondary | ICD-10-CM | POA: Diagnosis not present

## 2022-05-29 DIAGNOSIS — Z7902 Long term (current) use of antithrombotics/antiplatelets: Secondary | ICD-10-CM | POA: Diagnosis not present

## 2022-05-30 ENCOUNTER — Encounter: Payer: Self-pay | Admitting: Nurse Practitioner

## 2022-06-05 DIAGNOSIS — K219 Gastro-esophageal reflux disease without esophagitis: Secondary | ICD-10-CM | POA: Diagnosis not present

## 2022-06-05 DIAGNOSIS — I10 Essential (primary) hypertension: Secondary | ICD-10-CM | POA: Diagnosis not present

## 2022-06-05 DIAGNOSIS — I69354 Hemiplegia and hemiparesis following cerebral infarction affecting left non-dominant side: Secondary | ICD-10-CM | POA: Diagnosis not present

## 2022-06-05 DIAGNOSIS — K743 Primary biliary cirrhosis: Secondary | ICD-10-CM | POA: Diagnosis not present

## 2022-06-05 DIAGNOSIS — Z7984 Long term (current) use of oral hypoglycemic drugs: Secondary | ICD-10-CM | POA: Diagnosis not present

## 2022-06-05 DIAGNOSIS — R1310 Dysphagia, unspecified: Secondary | ICD-10-CM | POA: Diagnosis not present

## 2022-06-05 DIAGNOSIS — Z9181 History of falling: Secondary | ICD-10-CM | POA: Diagnosis not present

## 2022-06-05 DIAGNOSIS — K7400 Hepatic fibrosis, unspecified: Secondary | ICD-10-CM | POA: Diagnosis not present

## 2022-06-05 DIAGNOSIS — Z7902 Long term (current) use of antithrombotics/antiplatelets: Secondary | ICD-10-CM | POA: Diagnosis not present

## 2022-06-05 DIAGNOSIS — E119 Type 2 diabetes mellitus without complications: Secondary | ICD-10-CM | POA: Diagnosis not present

## 2022-06-05 DIAGNOSIS — E785 Hyperlipidemia, unspecified: Secondary | ICD-10-CM | POA: Diagnosis not present

## 2022-06-05 DIAGNOSIS — J449 Chronic obstructive pulmonary disease, unspecified: Secondary | ICD-10-CM | POA: Diagnosis not present

## 2022-06-06 ENCOUNTER — Telehealth (INDEPENDENT_AMBULATORY_CARE_PROVIDER_SITE_OTHER): Payer: Self-pay

## 2022-06-06 LAB — COMPREHENSIVE METABOLIC PANEL
ALT: 12 IU/L (ref 0–32)
AST: 17 IU/L (ref 0–40)
Albumin/Globulin Ratio: 1.7 (ref 1.2–2.2)
Albumin: 4.5 g/dL (ref 3.8–4.8)
Alkaline Phosphatase: 111 IU/L (ref 44–121)
BUN/Creatinine Ratio: 23 (ref 12–28)
BUN: 16 mg/dL (ref 8–27)
Bilirubin Total: 0.8 mg/dL (ref 0.0–1.2)
CO2: 22 mmol/L (ref 20–29)
Calcium: 10.7 mg/dL — ABNORMAL HIGH (ref 8.7–10.3)
Chloride: 94 mmol/L — ABNORMAL LOW (ref 96–106)
Creatinine, Ser: 0.7 mg/dL (ref 0.57–1.00)
Globulin, Total: 2.6 g/dL (ref 1.5–4.5)
Glucose: 103 mg/dL — ABNORMAL HIGH (ref 70–99)
Potassium: 4 mmol/L (ref 3.5–5.2)
Sodium: 136 mmol/L (ref 134–144)
Total Protein: 7.1 g/dL (ref 6.0–8.5)
eGFR: 88 mL/min/{1.73_m2} (ref >59)

## 2022-06-06 LAB — CBC
Hematocrit: 46.4 % (ref 34.0–46.6)
Hemoglobin: 14.9 g/dL (ref 11.1–15.9)
MCH: 24.8 pg — ABNORMAL LOW (ref 26.6–33.0)
MCHC: 32.1 g/dL (ref 31.5–35.7)
MCV: 77 fL — ABNORMAL LOW (ref 79–97)
Platelets: 283 10*3/uL (ref 150–450)
RBC: 6.01 x10E6/uL — ABNORMAL HIGH (ref 3.77–5.28)
RDW: 14.8 % (ref 11.7–15.4)
WBC: 7.3 10*3/uL (ref 3.4–10.8)

## 2022-06-06 LAB — PROTIME-INR
INR: 1.2 (ref 0.9–1.2)
Prothrombin Time: 12.7 s — ABNORMAL HIGH (ref 9.1–12.0)

## 2022-06-06 LAB — AFP TUMOR MARKER: AFP, Serum, Tumor Marker: 4.3 ng/mL (ref 0.0–9.2)

## 2022-06-06 NOTE — Telephone Encounter (Signed)
I spoke with patient and explained she needed to take Miralax Q eight hour starting now until she has a bm, then she will need to consistently take Miralax one capful once per day there after. Patient states understanding and states she could not get to the office today. I advised if she is still having issues and develops more symptoms abdominal pain and still unable to have a bm after taking the miralax as instructed to call the office or to go to the Ed. Patient states understanding.

## 2022-06-06 NOTE — Telephone Encounter (Signed)
Patient called today states she is having issues with constipation. She says she took miralax yesterday and today, and took a stool softener two days ago.  She says she had a small hard bm this morning,but has not really had a bm before then since she was seen here by chelsea for constipation on 05/27/2022. Patient denies any abdominal pains,or any other gi issues, but does mention she has gas a lot. She says she really needs some help. Per Vikki Ports last note 05/27/2022,   Patient states she has increased her water intake and has increased fiber in diet, but did not start the miralax on a daily basis.                     Instructions  Please increase water intake. Start taking 1 capful of miralax every 12 hours. If after 2-3 days, there is no improvement increase to 1 capful every 8 hours. We will get you scheduled for colonoscopy  Please make sure diet is high in fruits, veggies and whole grains.   You also had labs ordered in regards to your liver in April that were not completed, please try to complete these prior to your next visit   You have follow up scheduled for October with Dr. Jenetta Downer

## 2022-06-10 ENCOUNTER — Encounter: Payer: Self-pay | Admitting: Family Medicine

## 2022-06-10 ENCOUNTER — Ambulatory Visit (INDEPENDENT_AMBULATORY_CARE_PROVIDER_SITE_OTHER): Payer: Medicare Other | Admitting: Family Medicine

## 2022-06-10 ENCOUNTER — Encounter (INDEPENDENT_AMBULATORY_CARE_PROVIDER_SITE_OTHER): Payer: Self-pay

## 2022-06-10 VITALS — BP 120/73 | HR 100 | Ht 62.0 in | Wt 105.0 lb

## 2022-06-10 DIAGNOSIS — Z23 Encounter for immunization: Secondary | ICD-10-CM | POA: Diagnosis not present

## 2022-06-10 DIAGNOSIS — E1142 Type 2 diabetes mellitus with diabetic polyneuropathy: Secondary | ICD-10-CM

## 2022-06-10 DIAGNOSIS — U071 COVID-19: Secondary | ICD-10-CM | POA: Diagnosis not present

## 2022-06-10 DIAGNOSIS — E559 Vitamin D deficiency, unspecified: Secondary | ICD-10-CM | POA: Diagnosis not present

## 2022-06-10 DIAGNOSIS — Z0001 Encounter for general adult medical examination with abnormal findings: Secondary | ICD-10-CM | POA: Diagnosis not present

## 2022-06-10 DIAGNOSIS — J309 Allergic rhinitis, unspecified: Secondary | ICD-10-CM

## 2022-06-10 DIAGNOSIS — R7301 Impaired fasting glucose: Secondary | ICD-10-CM

## 2022-06-10 DIAGNOSIS — Z1321 Encounter for screening for nutritional disorder: Secondary | ICD-10-CM | POA: Insufficient documentation

## 2022-06-10 DIAGNOSIS — Z720 Tobacco use: Secondary | ICD-10-CM

## 2022-06-10 NOTE — Assessment & Plan Note (Signed)
Physical exam as documented Counseling is done on healthy lifestyle involving commitment to 150 minutes of exercise per week,  Discussed heart-healthy diet and attaining a healthy weight Changes in health habits are decided on by the patient with goals and time frames set for achieving them. Immunization and cancer screening needs are specifically addressed at this visit     

## 2022-06-10 NOTE — Assessment & Plan Note (Signed)
Encouraged to continue taking Singulair 10 mg at bedtime Informed patient that she has 3 refills on the medication Encouraged to use azelastine nasal spray 2 times daily Encouraged to take over-the-counter Robitussin syrup for cough

## 2022-06-10 NOTE — Patient Instructions (Addendum)
I appreciate the opportunity to provide care to you today!    Follow up:  4 months  Fasting labs: please stop by the lab today to get your blood drawn (CMP, TSH, Lipid profile, HgA1c, Vit D)   Please continue to a heart-healthy diet and increase your physical activities. Try to exercise for 9mns at least three times a week.      It was a pleasure to see you and I look forward to continuing to work together on your health and well-being. Please do not hesitate to call the office if you need care or have questions about your care.   Have a wonderful day and week. With Gratitude, GAlvira MondayMSN, FNP-BC

## 2022-06-10 NOTE — Assessment & Plan Note (Signed)
She reports that she has quit smoking but she is currently vaping Informed patient that vaping and smoking share similar health risks and are harmful Encourage cessation of vaping

## 2022-06-10 NOTE — Progress Notes (Signed)
Complete physical exam  Patient: Elizabeth [name]   DOB: 10/26/1940   81 y.o. Female  MRN: [id number]  Subjective:    Chief Complaint  Patient presents with   Annual Exam   Cough    Coughing started yesterday 06/10/22 and right ear started urting this moring.    Elizabeth [name] is a 81 y.o. female who presents today for a complete physical exam. She reports consuming a diabetic diet. The patient does not participate in regular exercise at present. She generally feels well. She reports sleeping poorly. She does have additional problems to discuss today.    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 Complains of cough, shortness of breath, nasal congestion, sneezing, and sinus pressure.  She denies fever, chills, body aches,  headaches, and sore throat.  She reports taking Chlorpheniramine 4 mg for her sinuses. She was last seen on 12/19/2021 by Dr. Claybon Jabs for allergic sinusitis and was treated with Singulair 10 mg at bedtime and azelastine nasal spray.    Most recent fall risk assessment:    03/28/2022   11:15 AM  Fall Risk   Falls in the past year? 1  Comment Last fall July 2023. Seen in ED for Left Arm Injury.  Number falls in past yr: 1  Injury with Fall? 1     Most recent depression screenings:    03/28/2022   11:16 AM 02/13/2022   10:28 AM  PHQ 2/9 Scores  PHQ - 2 Score 2 2    Dental: No current dental problems and Receives regular dental care  Patient Active Problem List   Diagnosis Date Noted   Need for immunization against influenza 06/10/2022   Encounter for general adult medical examination with abnormal findings 06/10/2022   Constipation 05/27/2022   Drooling 02/07/2022   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 ([place]) 01/30/2022   Cerebrovascular accident (CVA) due to thrombosis of cerebral artery ([place]) 01/30/2022   Hemiparesis affecting left side as late effect of cerebrovascular accident ([place]) 01/30/2022   Dysphagia  01/30/2022   Dizziness 01/29/2022   Fall 12/27/2021   Abnormal liver ultrasound 12/26/2021   Allergic sinusitis 12/23/2021   Right-sided lacunar stroke ([place]) 11/19/2021   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   History of iron deficiency anemia 12/22/2019   IDA (iron deficiency anemia) 07/04/2019   Hepatic fibrosis 07/04/2019   Hyperlipidemia associated with type 2 diabetes mellitus ([place]) 03/25/2019   Rectal bleeding    Esophageal dysphagia 07/28/2018   Stroke ([place])    Anxiety and depression 10/21/2017   Abdominal aortic atherosclerosis ([place]) 10/21/2017   Type 2 diabetes mellitus with hyperlipidemia ([place]) 09/06/2015   Type 2 diabetes mellitus with peripheral neuropathy ([place]) 11/27/2014   CVA (cerebral vascular accident) ([place]) 02/27/2012   Cerebral aneurysm 02/27/2012   Tobacco abuse 02/27/2012   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 02/27/2012   Gait instability 02/27/2012   Vision disturbance following cerebrovascular accident 02/27/2012   Dyspnea 11/14/2011   Thyroid disease 10/12/2008   Diabetes ([place]) 08/29/2008   Obesity, unspecified 08/29/2008   COPD (chronic obstructive pulmonary disease) ([place]) 08/29/2008   WEIGHT LOSS 08/29/2008   Hypertension associated with diabetes ([place]) 08/28/2008   ASTHMA 08/28/2008   ESOPHAGEAL STRICTURE 08/28/2008   GERD (gastroesophageal reflux disease) 08/28/2008   HIATAL HERNIA 08/28/2008   Diverticulosis of colon 08/28/2008   Past Medical History:  Diagnosis Date   Absolute anemia 07/28/2018   Active smoker    Allergy    Anxiety  Takes Xanax for anxiety   Arthritis    Asthma    Cataract    Chronic airway obstruction, not elsewhere classified    Clotting disorder ([place])    stoke    Depression    Diarrhea    Diverticulosis of colon (without mention of hemorrhage)    Esophageal reflux    Family history of malignant neoplasm of gastrointestinal tract    GI bleed    Headache(784.0)    Irregular   Neuromuscular disorder (HCC)     numbness is left hand and left elbow   Obesity, unspecified    Other and unspecified hyperlipidemia    Personal history of colonic polyps    Stricture and stenosis of esophagus    Stroke ([place])    Thyroid disease    Type II or unspecified type diabetes mellitus without mention of complication, not stated as uncontrolled    Unspecified asthma(493.90)    Unspecified essential hypertension    Past Surgical History:  Procedure Laterality Date   APPENDECTOMY     BACK SURGERY     Spinal surgery   BRAIN SURGERY     2013 - stoke ( fall in 2010)    CARDIAC CATHETERIZATION  2006   LAD: 30%, RCA : 20%, normal EF, elevated LVEDP   CARDIAC CATHETERIZATION     CEREBRAL ANGIOGRAM  12/20/2015   COLONOSCOPY N/A 08/09/2018   Procedure: COLONOSCOPY;  Surgeon: Rogene Houston, MD;  [facility];  Service: Endoscopy;  Laterality: N/A;  1:55   ESOPHAGEAL DILATION N/A 08/09/2018   Procedure: ESOPHAGEAL DILATION;  Surgeon: Rogene Houston, MD;  [facility];  Service: Endoscopy;  Laterality: N/A;   ESOPHAGOGASTRODUODENOSCOPY N/A 08/09/2018   Procedure: ESOPHAGOGASTRODUODENOSCOPY (EGD);  Surgeon: Rogene Houston, MD;  [facility];  Service: Endoscopy;  Laterality: N/A;   GIVENS CAPSULE STUDY N/A 08/25/2018   Procedure: GIVENS CAPSULE STUDY;  Surgeon: Rogene Houston, MD;  [facility];  Service: Endoscopy;  Laterality: N/A;   IR ANGIO INTRA EXTRACRAN SEL COM CAROTID INNOMINATE BILAT MOD SED  01/14/2018   IR ANGIO INTRA EXTRACRAN SEL COM CAROTID INNOMINATE BILAT MOD SED  05/11/2018   IR ANGIO VERTEBRAL SEL SUBCLAVIAN INNOMINATE UNI L MOD SED  01/14/2018   IR ANGIO VERTEBRAL SEL SUBCLAVIAN INNOMINATE UNI L MOD SED  05/11/2018   IR ANGIO VERTEBRAL SEL VERTEBRAL UNI R MOD SED  01/14/2018   IR ANGIO VERTEBRAL SEL VERTEBRAL UNI R MOD SED  05/11/2018   KNEE ARTHROSCOPY     left   LUMBAR DISC SURGERY     POLYPECTOMY  08/09/2018   Procedure: POLYPECTOMY;  Surgeon: Rogene Houston, MD;  [facility];  Service: Endoscopy;;  ascending colon (CS x 2, HS x5) hepatic flexure (HSx1), recto-sigmoid (HSx1)   TONSILLECTOMY     TOTAL ABDOMINAL HYSTERECTOMY     menopause/bleeding   Social History   Tobacco Use   Smoking status: Every Day    Types: E-cigarettes    Passive exposure: Current   Smokeless tobacco: Never  Vaping Use   Vaping Use: Some days   Substances: THC, CBD, Flavoring, Nicotine-salt, Synthetic cannabinoids, Mixture of cannabinoids, Homemade substance  Substance Use Topics   Alcohol use: No    Alcohol/week: 0.0 standard drinks of alcohol   Drug use: No   Social History   Socioeconomic History   Marital status: Married    Spouse name: [name]   Number of children: 0  Years of education: 46   Highest education level: Bachelor's degree (e.g., BA, AB, BS)  Occupational History   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   Tobacco Use   Smoking status: Every Day    Types: E-cigarettes    Passive exposure: Current   Smokeless tobacco: Never  Vaping Use   Vaping Use: Some days   Substances: THC, CBD, Flavoring, Nicotine-salt, Synthetic cannabinoids, Mixture of cannabinoids, Homemade substance  Substance and Sexual Activity   Alcohol use: No    Alcohol/week: 0.0 standard drinks of alcohol   Drug use: No   Sexual activity: Not Currently  Other Topics Concern   Not on file  Social History Narrative   Married [name]   Limited activity due to back pain   Social Determinants of Health   Financial Resource Strain: Low Risk  (01/28/2021)   Overall Financial Resource Strain (CARDIA)    Difficulty of Paying Living Expenses: Not hard at all  Food Insecurity: No Food Insecurity (01/28/2021)   Hunger Vital Sign    Worried About Running Out of Food in the Last Year: Never true    Ran Out of Food in the Last Year: Never true  Transportation Needs: No Transportation Needs (01/28/2021)   PRAPARE - Radiographer, therapeutic (Medical): No    Lack of Transportation (Non-Medical): No  Physical Activity: Insufficiently Active (01/28/2021)   Exercise Vital Sign    Days of Exercise per Week: 3 days    Minutes of Exercise per Session: 20 min  Stress: No Stress Concern Present (01/28/2021)   [place]    Feeling of Stress : Not at all  Social Connections: Moderately Isolated (01/28/2021)   Social Connection and Isolation Panel [NHANES]    Frequency of Communication with Friends and Family: More than three times a week    Frequency of Social Gatherings with Friends and Family: More than three times a week    Attends Religious Services: Never    Marine scientist or Organizations: No    Attends Archivist Meetings: Never    Marital Status: Married  Human resources officer Violence: Not At Risk (01/28/2021)   Humiliation, Afraid, Rape, and Kick questionnaire    Fear of Current or Ex-Partner: No    Emotionally Abused: No    Physically Abused: No    Sexually Abused: No   Family Status  Relation Name Status   Mother  Deceased   Father  Deceased at age 4   Other  (Not Specified)   PGM  Deceased   MGM  Deceased   Other  (Not Specified)   Sister Pamala Hurry Deceased   Brother edward Deceased   Sister martha Deceased   MGF  Deceased   PGF  Deceased   Other married 33   Family History  Problem Relation Age of Onset   Diabetes Mother    Heart disease Mother    Asthma Mother    Kidney disease Mother    Drug abuse Mother    Hypertension Mother    Stroke Mother        fall -several bleed on brain 1988-11-23   Early death Father        trauma   Colon cancer Other        first cousin, paternal aunt and uncle   Cancer Paternal Grandmother        gastric   Diabetes  Paternal Grandmother    Mental illness Paternal Grandmother    Heart disease Maternal Grandmother    Thyroid cancer Other        aunt   Heart disease Sister     Mental illness Sister    Heart attack Sister    Heart disease Brother    Lung disease Brother    Cancer Sister        pancreatic   Diabetes Sister    Hypertension Sister    Tuberculosis Maternal Grandfather    Heart disease Paternal Grandfather    Allergies  Allergen Reactions   Banana Anaphylaxis   Codeine Nausea And Vomiting and Other (See Comments)    PROJECTILE VOMITING   Stevia [Stevioside] Swelling and Other (See Comments)    Face, tongue, and eye swelling    Actos [Pioglitazone] Swelling   Banana Flavor    Benicar [Olmesartan Medoxomil] Other (See Comments)    Feel bad   Benicar [Olmesartan]    Chantix [Varenicline] Other (See Comments)    "Worked opposite."   Diovan [Valsartan] Other (See Comments)    Feel bad   Lipitor [Atorvastatin] Other (See Comments)    REACTION:  Joint pain   Rice Other (See Comments)    Gives patient indigestion   Spinach Other (See Comments)    Stomach problems   Statins Other (See Comments)    Feels bad, leg cramps, worries about liver effects.   Vicodin [Hydrocodone-Acetaminophen] Nausea And Vomiting   Zocor [Simvastatin] Other (See Comments)    REACTION:  "made liver function incorrectly"   Ace Inhibitors Cough   Celebrex [Celecoxib] Rash   Latex Rash and Other (See Comments)    REACTION: red rash   Metformin And Related     GI - upset    Mushroom Extract Complex Palpitations   Penicillins Rash and Other (See Comments)    Has patient had a PCN reaction causing immediate rash, facial/tongue/throat swelling, SOB or lightheadedness with hypotension: No Has patient had a PCN reaction causing severe rash involving mucus membranes or skin necrosis: No Has patient had a PCN reaction that required hospitalization: No- MD office Has patient had a PCN reaction occurring within the last 10 years: No If all of the above answers are "NO", then may proceed with Cephalosporin use.    Sulfonamide Derivatives Rash      Patient Care  Team: Alvira Monday, FNP as PCP - General (Family Medicine) Particia Nearing, OD (Optometry) Rogene Houston, MD as Consulting Physician (Gastroenterology) Luanne Bras, MD as Consulting Physician (Interventional Radiology)   Outpatient Medications Prior to Visit  Medication Sig   albuterol (VENTOLIN HFA) 108 (90 Base) MCG/ACT inhaler INHALE 2 PUFFS EVERY SIX HOURS AS NEEDED FOR WHEEZING OR SHORTNESS OF BREATH   amLODipine (NORVASC) 10 MG tablet TAKE 1 TABLET BY MOUTH EVERY DAY (Patient taking differently: Take 10 mg by mouth daily.)   azelastine (ASTELIN) 0.1 % nasal spray Place 2 sprays into both nostrils 2 (two) times daily. Use in each nostril as directed   clopidogrel (PLAVIX) 75 MG tablet Take 1 tablet (75 mg total) by mouth daily.   docusate sodium (COLACE) 100 MG capsule Take 1 capsule (100 mg total) by mouth daily.   empagliflozin (JARDIANCE) 10 MG TABS tablet Take 1 tablet (10 mg total) by mouth daily before breakfast.   Evolocumab (REPATHA SURECLICK) 937 MG/ML SOAJ Inject 1 mL into the skin every 14 (fourteen) days.   Grape Seed OIL by Does not apply route daily  at 6 (six) AM.   meclizine (ANTIVERT) 12.5 MG tablet Take 1 tablet (12.5 mg total) by mouth 3 (three) times daily as needed for dizziness.   methocarbamol (ROBAXIN) 500 MG tablet Take 1 tablet (500 mg total) by mouth every 6 (six) hours as needed for muscle spasms.   montelukast (SINGULAIR) 10 MG tablet TAKE 1 TABLET BY MOUTH AT BEDTIME   ondansetron (ZOFRAN) 4 MG tablet Take 1 tablet (4 mg total) by mouth every 6 (six) hours.   OVER THE COUNTER MEDICATION daily at 6 (six) AM. brain essense   pantoprazole (PROTONIX) 40 MG tablet TAKE 1 TABLET BY MOUTH DAILY BEFORE BREAKFAST   polyethylene glycol-electrolytes (TRILYTE) 420 g solution Take 4,000 mLs by mouth as directed.   ursodiol (ACTIGALL) 300 MG capsule Take 1 capsule (300 mg total) by mouth 3 (three) times daily.   oxybutynin (DITROPAN XL) 15 MG 24 hr tablet 1  tablet DAILY (route: oral)   No facility-administered medications prior to visit.    Review of Systems  Constitutional:  Negative for chills and fever.  HENT:  Positive for congestion and sore throat. Negative for sinus pain.   Eyes:  Positive for blurred vision.  Respiratory:  Positive for cough, sputum production and shortness of breath.   Cardiovascular:  Negative for chest pain and palpitations.  Gastrointestinal:  Negative for abdominal pain, heartburn, nausea and vomiting.  Genitourinary:  Negative for frequency, hematuria and urgency.  Musculoskeletal:  Negative for falls.  Skin:  Negative for itching.  Neurological:  Negative for dizziness and headaches.  Psychiatric/Behavioral:  Negative for depression and memory loss.           Objective:     BP 120/73 (BP Location: Right Arm, Patient Position: Sitting)   Pulse 100   Ht '5\' 2"'  (1.575 m)   Wt 105 lb (47.6 kg)   SpO2 95%   BMI 19.20 kg/m  BP Readings from Last 3 Encounters:  06/10/22 120/73  05/27/22 137/82  03/28/22 130/81   Wt Readings from Last 3 Encounters:  06/10/22 105 lb (47.6 kg)  05/27/22 115 lb 8 oz (52.4 kg)  03/26/22 145 lb (65.8 kg)      Physical Exam HENT:     Head: Normocephalic.  Neurological:     Mental Status: She is alert.      No results found for any visits on 06/10/22. Last CBC Lab Results  Component Value Date   WBC 7.3 06/05/2022   HGB 14.9 06/05/2022   HCT 46.4 06/05/2022   MCV 77 (L) 06/05/2022   MCH 24.8 (L) 06/05/2022   RDW 14.8 06/05/2022   PLT 283 70/62/3762   Last metabolic panel Lab Results  Component Value Date   GLUCOSE 103 (H) 06/05/2022   NA 136 06/05/2022   K 4.0 06/05/2022   CL 94 (L) 06/05/2022   CO2 22 06/05/2022   BUN 16 06/05/2022   CREATININE 0.70 06/05/2022   EGFR 88 06/05/2022   CALCIUM 10.7 (H) 06/05/2022   PROT 7.1 06/05/2022   ALBUMIN 4.5 06/05/2022   LABGLOB 2.6 06/05/2022   AGRATIO 1.7 06/05/2022   BILITOT 0.8 06/05/2022    ALKPHOS 111 06/05/2022   AST 17 06/05/2022   ALT 12 06/05/2022   ANIONGAP 9 12/10/2021   Last lipids Lab Results  Component Value Date   CHOL 232 (H) 08/26/2021   HDL 58 08/26/2021   LDLCALC 156 (H) 08/26/2021   TRIG 101 08/26/2021   CHOLHDL 4.0 08/26/2021   Last hemoglobin  A1c Lab Results  Component Value Date   HGBA1C 5.8 (H) 08/26/2021   Last thyroid functions Lab Results  Component Value Date   TSH 1.570 08/26/2021   T4TOTAL 10.0 03/30/2019   Last vitamin D Lab Results  Component Value Date   VD25OH 47.5 11/10/2018   Last vitamin B12 and Folate Lab Results  Component Value Date   VITAMINB12 642 11/14/2020   FOLATE 28.2 11/14/2020        Assessment & Plan:    Routine Health Maintenance and Physical Exam  Immunization History  Administered Date(s) Administered   Fluad Quad(high Dose 65+) 06/10/2022   Influenza Whole 06/08/2010   Influenza, High Dose Seasonal PF 06/17/2019   Influenza,inj,Quad PF,6+ Mos 07/07/2013, 07/06/2014, 07/17/2017   Influenza-Unspecified 08/05/2016   Moderna Covid-19 Vaccine Bivalent Booster 27yr & up 06/06/2021   Moderna SARS-COV2 Booster Vaccination 07/24/2020   Moderna Sars-Covid-2 Vaccination 10/13/2019, 11/11/2019   Pneumococcal Conjugate-13 11/25/2013   Pneumococcal Polysaccharide-23 09/09/2007, 07/06/2014    Health Maintenance  Topic Date Due   Zoster Vaccines- Shingrix (1 of 2) Never done   OPHTHALMOLOGY EXAM  05/05/2015   Diabetic kidney evaluation - Urine ACR  10/02/2020   TETANUS/TDAP  01/06/2021   COVID-19 Vaccine (4 - Moderna risk series) 08/01/2021   HEMOGLOBIN A1C  02/24/2022   FOOT EXAM  12/28/2022   Diabetic kidney evaluation - GFR measurement  06/06/2023   Pneumonia Vaccine 81 Years old  Completed   INFLUENZA VACCINE  Completed   DEXA SCAN  Completed   Hepatitis C Screening  Completed   HPV VACCINES  Aged Out    Discussed health benefits of physical activity, and encouraged her to engage in regular  exercise appropriate for her age and condition.  Problem List Items Addressed This Visit       Respiratory   Allergic sinusitis    Encouraged to continue taking Singulair 10 mg at bedtime Informed patient that she has 3 refills on the medication Encouraged to use azelastine nasal spray 2 times daily Encouraged to take over-the-counter Robitussin syrup for cough        Endocrine   Type 2 diabetes mellitus with peripheral neuropathy (HCC)   Relevant Orders   Microalbumin / creatinine urine ratio     Other   Tobacco abuse (Chronic)    She reports that she has quit smoking but she is currently vaping Informed patient that vaping and smoking share similar health risks and are harmful Encourage cessation of vaping      Need for immunization against influenza    Patient educated on CDC recommendation for the vaccine. Verbal consent was obtained from the patient, vaccine administered by nurse, no sign of adverse reactions noted at this time. Patient education on arm soreness and use of tylenol or ibuprofen for this patient  was discussed. Patient educated on the signs and symptoms of adverse effect and advise to contact the office if they occur.       Relevant Orders   Flu Vaccine QUAD High Dose(Fluad) (Completed)   Encounter for general adult medical examination with abnormal findings - Primary    Physical exam as documented Counseling is done on healthy lifestyle involving commitment to 150 minutes of exercise per week,  Discussed heart-healthy diet and attaining a healthy weight Changes in health habits are decided on by the patient with goals and time frames set for achieving them. Immunization and cancer screening needs are specifically addressed at this visit  Other Visit Diagnoses     COVID-19       Relevant Orders   Novel Coronavirus, NAA ([facility])   Vitamin D deficiency       Relevant Orders   Vitamin D (25 hydroxy)   IFG (impaired fasting glucose)        Relevant Orders   Lipid Profile   Hemoglobin A1C   TSH + free T4   CMP14+EGFR      Return in about 4 months (around 10/11/2022).     Alvira Monday, FNP

## 2022-06-10 NOTE — Assessment & Plan Note (Signed)
Patient educated on CDC recommendation for the vaccine. Verbal consent was obtained from the patient, vaccine administered by nurse, no sign of adverse reactions noted at this time. Patient education on arm soreness and use of tylenol or ibuprofen for this patient  was discussed. Patient educated on the signs and symptoms of adverse effect and advise to contact the office if they occur.  

## 2022-06-12 LAB — NOVEL CORONAVIRUS, NAA: SARS-CoV-2, NAA: NOT DETECTED

## 2022-06-12 NOTE — Progress Notes (Signed)
Please inform the patient that her COVID test returned negative.

## 2022-06-13 LAB — TSH+FREE T4
Free T4: 1.17 ng/dL (ref 0.82–1.77)
TSH: 1.17 u[IU]/mL (ref 0.450–4.500)

## 2022-06-13 LAB — HEMOGLOBIN A1C
Est. average glucose Bld gHb Est-mCnc: 114 mg/dL
Hgb A1c MFr Bld: 5.6 % (ref 4.8–5.6)

## 2022-06-13 LAB — LIPID PANEL
Chol/HDL Ratio: 4 ratio (ref 0.0–4.4)
Cholesterol, Total: 227 mg/dL — ABNORMAL HIGH (ref 100–199)
HDL: 57 mg/dL (ref >39)
LDL Chol Calc (NIH): 150 mg/dL — ABNORMAL HIGH (ref 0–99)
Triglycerides: 114 mg/dL (ref 0–149)
VLDL Cholesterol Cal: 20 mg/dL (ref 5–40)

## 2022-06-13 LAB — CMP14+EGFR
ALT: 12 IU/L (ref 0–32)
AST: 18 IU/L (ref 0–40)
Albumin/Globulin Ratio: 1.8 (ref 1.2–2.2)
Albumin: 4.4 g/dL (ref 3.8–4.8)
Alkaline Phosphatase: 111 IU/L (ref 44–121)
BUN/Creatinine Ratio: 18 (ref 12–28)
BUN: 13 mg/dL (ref 8–27)
Bilirubin Total: 0.7 mg/dL (ref 0.0–1.2)
CO2: 24 mmol/L (ref 20–29)
Calcium: 10.4 mg/dL — ABNORMAL HIGH (ref 8.7–10.3)
Chloride: 93 mmol/L — ABNORMAL LOW (ref 96–106)
Creatinine, Ser: 0.73 mg/dL (ref 0.57–1.00)
Globulin, Total: 2.4 g/dL (ref 1.5–4.5)
Glucose: 164 mg/dL — ABNORMAL HIGH (ref 70–99)
Potassium: 3.3 mmol/L — ABNORMAL LOW (ref 3.5–5.2)
Sodium: 133 mmol/L — ABNORMAL LOW (ref 134–144)
Total Protein: 6.8 g/dL (ref 6.0–8.5)
eGFR: 84 mL/min/{1.73_m2} (ref >59)

## 2022-06-13 LAB — VITAMIN D 25 HYDROXY (VIT D DEFICIENCY, FRACTURES): Vit D, 25-Hydroxy: 26.3 ng/mL — ABNORMAL LOW (ref 30.0–100.0)

## 2022-06-15 LAB — MICROALBUMIN / CREATININE URINE RATIO
Creatinine, Urine: 63.1 mg/dL
Microalb/Creat Ratio: 16 mg/g creat (ref 0–29)
Microalbumin, Urine: 10.2 ug/mL

## 2022-06-16 ENCOUNTER — Other Ambulatory Visit: Payer: Self-pay | Admitting: Family Medicine

## 2022-06-16 ENCOUNTER — Other Ambulatory Visit: Payer: Self-pay | Admitting: Orthopedic Surgery

## 2022-06-16 DIAGNOSIS — E1169 Type 2 diabetes mellitus with other specified complication: Secondary | ICD-10-CM

## 2022-06-16 DIAGNOSIS — E559 Vitamin D deficiency, unspecified: Secondary | ICD-10-CM

## 2022-06-16 MED ORDER — EZETIMIBE 10 MG PO TABS: 10.0000 mg | ORAL_TABLET | Freq: Every day | ORAL | 3 refills | Status: DC

## 2022-06-16 MED ORDER — VITAMIN D (ERGOCALCIFEROL) 1.25 MG (50000 UNIT) PO CAPS: 50000.0000 [IU] | ORAL_CAPSULE | ORAL | 2 refills | Status: DC

## 2022-06-16 NOTE — Progress Notes (Signed)
er

## 2022-06-16 NOTE — Progress Notes (Signed)
  Please inform patient that her vitamin D is low.  I sent a prescription for vitamin D weekly supplement to her pharmacy.  Please encouraged the patient to pick up the prescription and start therapy.  Her cholesterol is elevated please continue on taking Repatha and have also sent a prescription of Zetia 10 mg daily prescriptions to her pharmacy to start therapy for cholesterol.  I recommend low carbs and fat diet.  Her potassium is slightly low, I recommend increasing her intake of potassium rich foods just as bananas.  Her hemoglobin A1c is in the prediabetic range.  I recommend reducing her intake of sugary foods with increased physical activities.

## 2022-06-17 NOTE — Progress Notes (Signed)
No follow-up LDCT scan schedule due to patient age. Referral closed per protocol.

## 2022-06-18 DIAGNOSIS — I69354 Hemiplegia and hemiparesis following cerebral infarction affecting left non-dominant side: Secondary | ICD-10-CM | POA: Insufficient documentation

## 2022-06-19 ENCOUNTER — Other Ambulatory Visit: Payer: Self-pay | Admitting: Orthopedic Surgery

## 2022-06-23 ENCOUNTER — Other Ambulatory Visit (INDEPENDENT_AMBULATORY_CARE_PROVIDER_SITE_OTHER): Payer: Self-pay

## 2022-06-26 ENCOUNTER — Other Ambulatory Visit: Payer: Self-pay

## 2022-06-26 ENCOUNTER — Encounter: Payer: Self-pay | Admitting: Orthopedic Surgery

## 2022-06-26 ENCOUNTER — Ambulatory Visit (INDEPENDENT_AMBULATORY_CARE_PROVIDER_SITE_OTHER): Payer: Medicare Other | Admitting: Orthopedic Surgery

## 2022-06-26 VITALS — Ht 62.0 in | Wt 105.0 lb

## 2022-06-26 DIAGNOSIS — S42295D Other nondisplaced fracture of upper end of left humerus, subsequent encounter for fracture with routine healing: Secondary | ICD-10-CM | POA: Diagnosis not present

## 2022-06-26 NOTE — Telephone Encounter (Signed)
Tramadol 50 MG Qty 20 Tablets  Take 1 tablet (50 mg total) by mouth every 6 (six) hours as needed for up to 5 days.  PATIENT USES [facility]

## 2022-06-26 NOTE — Progress Notes (Signed)
FOLLOW UP   Encounter Diagnosis  Name Primary?   Other closed nondisplaced fracture of proximal end of left humerus with routine healing, subsequent encounter Yes     Chief Complaint  Patient presents with   Fracture    Lt shoulder 03/24/22   3 mos post injury   Left proximal humerus fracture treated closed and with physical therapy.  The patient reports no discomfort in her shoulder her range of motion has improved  We released her to follow-up as needed

## 2022-06-27 ENCOUNTER — Encounter: Payer: Medicare Other | Attending: Registered Nurse | Admitting: Physical Medicine & Rehabilitation

## 2022-06-27 ENCOUNTER — Encounter: Payer: Self-pay | Admitting: Physical Medicine & Rehabilitation

## 2022-06-27 VITALS — BP 116/72 | HR 80 | Ht 62.0 in | Wt 116.0 lb

## 2022-06-27 DIAGNOSIS — I69354 Hemiplegia and hemiparesis following cerebral infarction affecting left non-dominant side: Secondary | ICD-10-CM | POA: Insufficient documentation

## 2022-06-27 NOTE — Progress Notes (Signed)
Subjective:     Patient ID: Elizabeth [name], female   DOB: 15-May-1941, 81 y.o.   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  Patient was transferred to inpatient rehab from outside hospital.  She completed inpatient rehabilitation on 12/11/2021 at [facility].  She was receiving home health PT and OT. [place] and hurt left shoulder 7/18, foot slipped during transfer to [facility] at 1 AM.  Had immediate onset left shoulder pain.  Went to the emergency department.  X-rays at [facility], ER demonstrated comminuted left proximal humeral fracture.  Patient has seen orthopedics yesterday.  Orthopedics did order a sling as well as tramadol for pain  HPI  Has 4 more visits  Home health physical therapy, both patient and husband feel like they are addressing both her stroke needs as well as her orthopedic needs Needs assist for bathing and dressing, amb with CGA using cane and gait belt She was seen by orthopedics, Dr. Aline Brochure yesterday follow-up of closed nondisplaced fracture of the left humerus.  Was released Pain Inventory Average Pain 1 Pain Right Now 2 My pain is intermittent, constant, and dull  LOCATION OF PAIN  back, hip, left leg  BOWEL Number of stools per week: 1 Oral laxative use Yes  Type of laxative Miralax Enema or suppository use No    BLADDER Normal   Mobility walk with assistance use a cane ability to climb steps?  yes do you drive?  no use a wheelchair needs help with transfers Do you have any goals in this area?  yes  Function retired I need assistance with the following:  dressing, bathing, toileting, meal prep, household duties, and shopping Do you have any goals in this area?  yes  Neuro/Psych weakness tremor trouble walking depression anxiety loss of taste or smell  Prior Studies Any changes since last visit?  no  Physicians involved in your care Any changes since last visit?  no   Family  History  Problem Relation Age of Onset   Diabetes Mother    Heart disease Mother    Asthma Mother    Kidney disease Mother    Drug abuse Mother    Hypertension Mother    Stroke Mother        fall -several bleed on brain 33   Early death Father        trauma   Colon cancer Other        first cousin, paternal aunt and uncle   Cancer Paternal Grandmother        gastric   Diabetes Paternal Grandmother    Mental illness Paternal Grandmother    Heart disease Maternal Grandmother    Thyroid cancer Other        aunt   Heart disease Sister    Mental illness Sister    Heart attack Sister    Heart disease Brother    Lung disease Brother    Cancer Sister        pancreatic   Diabetes Sister    Hypertension Sister    Tuberculosis Maternal Grandfather    Heart disease Paternal Grandfather    Social History   Socioeconomic History   Marital status: Married    Spouse name: [name]   Number of children: 0   Years of education: 16   Highest education level: Bachelor's degree (e.g., BA, AB, BS)  Occupational History   Occupation: retired     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   Tobacco Use   Smoking status: Every Day    Types: E-cigarettes    Passive exposure: Current   Smokeless tobacco: Never  Vaping Use   Vaping Use: Some days   Substances: THC, CBD, Flavoring, Nicotine-salt, Synthetic cannabinoids, Mixture of cannabinoids, Homemade substance  Substance and Sexual Activity   Alcohol use: No    Alcohol/week: 0.0 standard drinks of alcohol   Drug use: No   Sexual activity: Not Currently  Other Topics Concern   Not on [name]  Social History Narrative   Married [name]   Limited activity due to back pain   Social Determinants of Health   Financial Resource Strain: Low Risk  (01/28/2021)   Overall Financial Resource Strain (CARDIA)    Difficulty of Paying Living Expenses: Not hard at all  Food Insecurity: No Food Insecurity (01/28/2021)   Hunger Vital Sign    Worried About  Running Out of Food in the Last Year: Never true    Ran Out of Food in the Last Year: Never true  Transportation Needs: No Transportation Needs (01/28/2021)   PRAPARE - Hydrologist (Medical): No    Lack of Transportation (Non-Medical): No  Physical Activity: Insufficiently Active (01/28/2021)   Exercise Vital Sign    Days of Exercise per Week: 3 days    Minutes of Exercise per Session: 20 min  Stress: No Stress Concern Present (01/28/2021)   [place]    Feeling of Stress : Not at all  Social Connections: Moderately Isolated (01/28/2021)   Social Connection and Isolation Panel [NHANES]    Frequency of Communication with Friends and Family: More than three times a week    Frequency of Social Gatherings with Friends and Family: More than three times a week    Attends Religious Services: Never    Marine scientist or Organizations: No    Attends Music therapist: Never    Marital Status: Married   Past Surgical History:  Procedure Laterality Date   APPENDECTOMY     BACK SURGERY     Spinal surgery   BRAIN SURGERY     2013 - stoke ( fall in 2010)    CARDIAC CATHETERIZATION  2006   LAD: 30%, RCA : 20%, normal EF, elevated LVEDP   CARDIAC CATHETERIZATION     CEREBRAL ANGIOGRAM  12/20/2015   COLONOSCOPY N/A 08/09/2018   Procedure: COLONOSCOPY;  Surgeon: Rogene Houston, MD;  [facility];  Service: Endoscopy;  Laterality: N/A;  1:55   ESOPHAGEAL DILATION N/A 08/09/2018   Procedure: ESOPHAGEAL DILATION;  Surgeon: Rogene Houston, MD;  [facility];  Service: Endoscopy;  Laterality: N/A;   ESOPHAGOGASTRODUODENOSCOPY N/A 08/09/2018   Procedure: ESOPHAGOGASTRODUODENOSCOPY (EGD);  Surgeon: Rogene Houston, MD;  [facility];  Service: Endoscopy;  Laterality: N/A;   GIVENS CAPSULE STUDY N/A 08/25/2018   Procedure: GIVENS CAPSULE STUDY;  Surgeon:  Rogene Houston, MD;  [facility];  Service: Endoscopy;  Laterality: N/A;   IR ANGIO INTRA EXTRACRAN SEL COM CAROTID INNOMINATE BILAT MOD SED  01/14/2018   IR ANGIO INTRA EXTRACRAN SEL COM CAROTID INNOMINATE BILAT MOD SED  05/11/2018   IR ANGIO VERTEBRAL SEL SUBCLAVIAN INNOMINATE UNI L MOD SED  01/14/2018   IR ANGIO VERTEBRAL SEL SUBCLAVIAN INNOMINATE UNI L MOD SED  05/11/2018   IR ANGIO VERTEBRAL SEL VERTEBRAL UNI R MOD SED  01/14/2018   IR ANGIO VERTEBRAL SEL VERTEBRAL UNI R MOD SED  05/11/2018   KNEE ARTHROSCOPY     left   LUMBAR DISC SURGERY     POLYPECTOMY  08/09/2018   Procedure: POLYPECTOMY;  Surgeon: Rogene Houston, MD;  [facility];  Service: Endoscopy;;  ascending colon (CS x 2, HS x5) hepatic flexure (HSx1), recto-sigmoid (HSx1)   TONSILLECTOMY     TOTAL ABDOMINAL HYSTERECTOMY     menopause/bleeding   Past Medical History:  Diagnosis Date   Absolute anemia 07/28/2018   Active smoker    Allergy    Anxiety    Takes Xanax for anxiety   Arthritis    Asthma    Cataract    Chronic airway obstruction, not elsewhere classified    Clotting disorder ([place])    stoke    Depression    Diarrhea    Diverticulosis of colon (without mention of hemorrhage)    Esophageal reflux    Family history of malignant neoplasm of gastrointestinal tract    GI bleed    Headache(784.0)    Irregular   Neuromuscular disorder (HCC)    numbness is left hand and left elbow   Obesity, unspecified    Other and unspecified hyperlipidemia    Personal history of colonic polyps    Stricture and stenosis of esophagus    Stroke [facility])    Thyroid disease    Type II or unspecified type diabetes mellitus without mention of complication, not stated as uncontrolled    Unspecified asthma(493.90)    Unspecified essential hypertension    BP 116/72   Pulse 80   Ht '5\' 2"'$  (1.575 m)   Wt 116 lb (52.6 kg)   SpO2 98%   BMI 21.22 kg/m   Opioid Risk Score:   Fall Risk Score:  `1  Depression  screen [facility] 2/9     03/28/2022   11:16 AM 02/13/2022   10:28 AM 02/10/2022    8:37 AM 02/07/2022   10:12 AM 01/29/2022   10:37 AM 12/31/2021    1:56 PM 12/27/2021    1:27 PM  Depression screen PHQ 2/9  Decreased Interest '1 1 2 3 '$ 0 3 3  Down, Depressed, Hopeless '1 1 2 3 1 3 3  '$ PHQ - 2 Score '2 2 4 6 1 6 6  '$ Altered sleeping   0 0  1   Tired, decreased energy   0 0 1 3 0  Change in appetite   0 0 0 3 3  Feeling bad or failure about yourself    '1 1 3 3 3  '$ Trouble concentrating   0 0 0 3 3  Moving slowly or fidgety/restless   0 0 0 3 0  Suicidal thoughts   0 0 0 0 0  PHQ-9 Score   '5 7  22   '$ Difficult doing work/chores   Somewhat difficult Somewhat difficult Extremely dIfficult  Somewhat difficult    Review of Systems  Musculoskeletal:  Positive for gait problem.  Neurological:  Positive for tremors and weakness.  Psychiatric/Behavioral:         Depression, anxiety  All other systems reviewed and are negative.      Objective:   Physical Exam General no acute distress Mood and affect are appropriate Motor strength is 3 - at the left deltoid 4 - at the bicep tricep finger flexors and extensors on the left side 5/5 in the right upper extremity and right lower extremity 4+ in  the left hip flexor knee extensor ankle dorsiflexor Sensation intact light touch in the left upper and left lower limb Speech without evidence of dysarthria or aphasia    Assessment:     1.  Left spastic hemiplegia appears to have plateaued in terms of motor recovery, has recovered from the left shoulder fracture and has been released by orthopedics.     Plan:     1.She will continue her home health PT which is addressing both her orthopedic and neurologic rehab needs.  We discussed outpatient therapy however both patient and husband do not wish to pursue this at the current time but will call if they decide to. Follow-up with PCP Physical medicine rehab follow-up on as-needed basis.

## 2022-06-27 NOTE — Patient Instructions (Signed)
Call if you decide on outpatient therapy at [facility]

## 2022-06-30 ENCOUNTER — Ambulatory Visit (INDEPENDENT_AMBULATORY_CARE_PROVIDER_SITE_OTHER): Payer: Medicare Other | Admitting: Gastroenterology

## 2022-06-30 ENCOUNTER — Encounter: Payer: Medicare Other | Admitting: Orthopedic Surgery

## 2022-07-02 ENCOUNTER — Encounter: Payer: Self-pay | Admitting: Family Medicine

## 2022-07-02 ENCOUNTER — Telehealth: Payer: Self-pay | Admitting: Family Medicine

## 2022-07-02 ENCOUNTER — Ambulatory Visit (INDEPENDENT_AMBULATORY_CARE_PROVIDER_SITE_OTHER): Payer: Medicare Other | Admitting: Family Medicine

## 2022-07-02 DIAGNOSIS — B9689 Other specified bacterial agents as the cause of diseases classified elsewhere: Secondary | ICD-10-CM

## 2022-07-02 DIAGNOSIS — J019 Acute sinusitis, unspecified: Secondary | ICD-10-CM | POA: Diagnosis not present

## 2022-07-02 MED ORDER — DOXYCYCLINE HYCLATE 100 MG PO TABS: 100.0000 mg | ORAL_TABLET | Freq: Two times a day (BID) | ORAL | 0 refills | Status: AC

## 2022-07-02 NOTE — Progress Notes (Signed)
Virtual Visit via Telephone Note   This visit type was conducted via telephone. This format is felt to be most appropriate for this patient at this time.  The patient did not have access to video technology/had technical difficulties with video requiring transitioning to audio format only (telephone).  All issues noted in this document were discussed and addressed.  No physical exam could be performed with this format.  Evaluation Performed:  Follow-up visit  Date:  07/02/2022   ID:  Elizabeth [name], DOB 1941-01-13, MRN [id number]  Patient Location: Home Provider Location: Clinic  Participants: Patient Location of Patient: Home Location of Provider: Clinic Consent was obtain for visit to be over via telehealth. I verified that I am speaking with the correct person using two identifiers.  PCP:  Alvira Monday, FNP   Chief Complaint: Sinus infection  History of Present Illness:    Elizabeth [name] is a 81 y.o. female with complaints of sinus headache, hoarseness, sinus pain and pressure, and sore throat.  She reports a temperature of 98.4, noting that she has been using her azelastine nasal spray.  Onset of symptoms on 06/25/2022.   The patient does have symptoms concerning for COVID-19 infection (fever, chills, cough, or new shortness of breath).   Past Medical, Surgical, Social History, Allergies, and Medications have been Reviewed.  Past Medical History:  Diagnosis Date   Absolute anemia 07/28/2018   Active smoker    Allergy    Anxiety    Takes Xanax for anxiety   Arthritis    Asthma    Cataract    Chronic airway obstruction, not elsewhere classified    Clotting disorder ([place])    stoke    Depression    Diarrhea    Diverticulosis of colon (without mention of hemorrhage)    Esophageal reflux    Family history of malignant neoplasm of gastrointestinal tract    GI bleed    Headache(784.0)    Irregular   Neuromuscular disorder (HCC)    numbness is left hand and left  elbow   Obesity, unspecified    Other and unspecified hyperlipidemia    Personal history of colonic polyps    Stricture and stenosis of esophagus    Stroke ([place])    Thyroid disease    Type II or unspecified type diabetes mellitus without mention of complication, not stated as uncontrolled    Unspecified asthma(493.90)    Unspecified essential hypertension    Past Surgical History:  Procedure Laterality Date   APPENDECTOMY     BACK SURGERY     Spinal surgery   BRAIN SURGERY     2013 - stoke ( fall in 2010)    CARDIAC CATHETERIZATION  2006   LAD: 30%, RCA : 20%, normal EF, elevated LVEDP   CARDIAC CATHETERIZATION     CEREBRAL ANGIOGRAM  12/20/2015   COLONOSCOPY N/A 08/09/2018   Procedure: COLONOSCOPY;  Surgeon: Rogene Houston, MD;  [facility];  Service: Endoscopy;  Laterality: N/A;  1:55   ESOPHAGEAL DILATION N/A 08/09/2018   Procedure: ESOPHAGEAL DILATION;  Surgeon: Rogene Houston, MD;  [facility];  Service: Endoscopy;  Laterality: N/A;   ESOPHAGOGASTRODUODENOSCOPY N/A 08/09/2018   Procedure: ESOPHAGOGASTRODUODENOSCOPY (EGD);  Surgeon: Rogene Houston, MD;  [facility];  Service: Endoscopy;  Laterality: N/A;   GIVENS CAPSULE STUDY N/A 08/25/2018   Procedure: GIVENS CAPSULE STUDY;  Surgeon: Rogene Houston, MD;  [facility];  Service:  Endoscopy;  Laterality: N/A;   IR ANGIO INTRA EXTRACRAN SEL COM CAROTID INNOMINATE BILAT MOD SED  01/14/2018   IR ANGIO INTRA EXTRACRAN SEL COM CAROTID INNOMINATE BILAT MOD SED  05/11/2018   IR ANGIO VERTEBRAL SEL SUBCLAVIAN INNOMINATE UNI L MOD SED  01/14/2018   IR ANGIO VERTEBRAL SEL SUBCLAVIAN INNOMINATE UNI L MOD SED  05/11/2018   IR ANGIO VERTEBRAL SEL VERTEBRAL UNI R MOD SED  01/14/2018   IR ANGIO VERTEBRAL SEL VERTEBRAL UNI R MOD SED  05/11/2018   KNEE ARTHROSCOPY     left   LUMBAR DISC SURGERY     POLYPECTOMY  08/09/2018   Procedure: POLYPECTOMY;  Surgeon: Rogene Houston, MD;  [facility];  Service: Endoscopy;;  ascending colon (CS x 2, HS x5) hepatic flexure (HSx1), recto-sigmoid (HSx1)   TONSILLECTOMY     TOTAL ABDOMINAL HYSTERECTOMY     menopause/bleeding     Current Meds  Medication Sig   albuterol (VENTOLIN HFA) 108 (90 Base) MCG/ACT inhaler INHALE 2 PUFFS EVERY SIX HOURS AS NEEDED FOR WHEEZING OR SHORTNESS OF BREATH   amLODipine (NORVASC) 10 MG tablet TAKE 1 TABLET BY MOUTH EVERY DAY (Patient taking differently: Take 10 mg by mouth daily.)   azelastine (ASTELIN) 0.1 % nasal spray Place 2 sprays into both nostrils 2 (two) times daily. Use in each nostril as directed   clopidogrel (PLAVIX) 75 MG tablet Take 1 tablet (75 mg total) by mouth daily.   docusate sodium (COLACE) 100 MG capsule Take 1 capsule (100 mg total) by mouth daily.   doxycycline (VIBRA-TABS) 100 MG tablet Take 1 tablet (100 mg total) by mouth 2 (two) times daily for 5 days.   empagliflozin (JARDIANCE) 10 MG TABS tablet Take 1 tablet (10 mg total) by mouth daily before breakfast.   Evolocumab (REPATHA SURECLICK) 326 MG/ML SOAJ Inject 1 mL into the skin every 14 (fourteen) days.   ezetimibe (ZETIA) 10 MG tablet Take 1 tablet (10 mg total) by mouth daily.   Grape Seed OIL by Does not apply route daily at 6 (six) AM.   meclizine (ANTIVERT) 12.5 MG tablet Take 1 tablet (12.5 mg total) by mouth 3 (three) times daily as needed for dizziness.   methocarbamol (ROBAXIN) 500 MG tablet Take 1 tablet (500 mg total) by mouth every 6 (six) hours as needed for muscle spasms.   montelukast (SINGULAIR) 10 MG tablet TAKE 1 TABLET BY MOUTH AT BEDTIME   ondansetron (ZOFRAN) 4 MG tablet TAKE 1 TABLET BY MOUTH EVERY 6 HOURS   OVER THE COUNTER MEDICATION daily at 6 (six) AM. brain essense   oxybutynin (DITROPAN XL) 15 MG 24 hr tablet 1 tablet DAILY (route: oral)   pantoprazole (PROTONIX) 40 MG tablet TAKE 1 TABLET BY MOUTH DAILY BEFORE BREAKFAST   polyethylene glycol-electrolytes (TRILYTE) 420 g solution Take 4,000 mLs by  mouth as directed.   ursodiol (ACTIGALL) 300 MG capsule Take 1 capsule (300 mg total) by mouth 3 (three) times daily.   Vitamin D, Ergocalciferol, (DRISDOL) 1.25 MG (50000 UNIT) CAPS capsule Take 1 capsule (50,000 Units total) by mouth every 7 (seven) days.     Allergies:   Banana, Codeine, Stevia [stevioside], Actos [pioglitazone], Banana flavor, Benicar [olmesartan medoxomil], Benicar [olmesartan], Chantix [varenicline], Diovan [valsartan], Lipitor [atorvastatin], Rice, Spinach, Statins, Vicodin [hydrocodone-acetaminophen], Zocor [simvastatin], Ace inhibitors, Celebrex [celecoxib], Latex, Metformin and related, Mushroom extract complex, Penicillins, and Sulfonamide derivatives   ROS:   Please see the history of present illness.  All other systems reviewed and are negative.   Labs/Other Tests and Data Reviewed:    Recent Labs: 06/05/2022: Hemoglobin 14.9; Platelets 283 06/12/2022: ALT 12; BUN 13; Creatinine, Ser 0.73; Potassium 3.3; Sodium 133; TSH 1.170   Recent Lipid Panel Lab Results  Component Value Date/Time   CHOL 227 (H) 06/12/2022 09:02 AM   CHOL 242 (H) 08/09/2013 08:28 AM   TRIG 114 06/12/2022 09:02 AM   TRIG 180 (H) 09/06/2015 09:47 AM   TRIG 92 08/09/2013 08:28 AM   HDL 57 06/12/2022 09:02 AM   HDL 43 09/06/2015 09:47 AM   HDL 53 08/09/2013 08:28 AM   CHOLHDL 4.0 06/12/2022 09:02 AM   CHOLHDL 7.0 01/21/2017 10:15 AM   LDLCALC 150 (H) 06/12/2022 09:02 AM   LDLCALC 230 (H) 02/24/2014 08:37 AM   LDLCALC 171 (H) 08/09/2013 08:28 AM    Wt Readings from Last 3 Encounters:  06/27/22 116 lb (52.6 kg)  06/26/22 105 lb (47.6 kg)  06/10/22 105 lb (47.6 kg)     Objective:    Vital Signs:  There were no vitals taken for this visit.     ASSESSMENT & PLAN:   Acute bacterial sinusitis Patient has penicillin allergy, will treat with doxycycline 100 mg every 12 hours for 5 days Encouraged symptomatic management with Tylenol for body aches, warm salt gargles for sore  throat, and encouraged to continue using azelastine nasal spray for nasal congestion Encouraged to take at home COVID test, and call the office if her test is positive  Time:   Today, I have spent 12 minutes reviewing the chart, including problem list, medications, and with the patient with telehealth technology discussing the above problems.   Medication Adjustments/Labs and Tests Ordered: Current medicines are reviewed at length with the patient today.  Concerns regarding medicines are outlined above.   Tests Ordered: No orders of the defined types were placed in this encounter.   Medication Changes: Meds ordered this encounter  Medications   doxycycline (VIBRA-TABS) 100 MG tablet    Sig: Take 1 tablet (100 mg total) by mouth 2 (two) times daily for 5 days.    Dispense:  10 tablet    Refill:  0     Note: This dictation was prepared with Dragon dictation along with smaller phrase technology. Similar sounding words can be transcribed inadequately or may not be corrected upon review. Any transcriptional errors that result from this process are unintentional.      Disposition:  Follow up  Signed, Alvira Monday, FNP  07/02/2022 12:41 PM     [facility]

## 2022-07-02 NOTE — Telephone Encounter (Signed)
Thank you :)

## 2022-07-02 NOTE — Telephone Encounter (Signed)
Patient called in with FYI   At home covid test was negative

## 2022-07-04 NOTE — Addendum Note (Signed)
Addended byAlvira Monday on: 07/04/2022 05:40 PM   Modules accepted: Level of Service

## 2022-07-14 ENCOUNTER — Other Ambulatory Visit (INDEPENDENT_AMBULATORY_CARE_PROVIDER_SITE_OTHER): Payer: Self-pay | Admitting: Internal Medicine

## 2022-07-14 ENCOUNTER — Other Ambulatory Visit: Payer: Self-pay | Admitting: Nurse Practitioner

## 2022-07-15 ENCOUNTER — Telehealth: Payer: Self-pay | Admitting: Family Medicine

## 2022-07-15 NOTE — Telephone Encounter (Signed)
Please encourage patient to schedule office visit

## 2022-07-15 NOTE — Telephone Encounter (Signed)
Patient called in regard to last visit on 10/25  Patient has finished antibiotic and is still experiencing wheezing.  Wants a call back in regard.

## 2022-07-15 NOTE — Telephone Encounter (Signed)
Pt reports a lot of wheezing and yellow mucus has completed the antibiotic, please advice?

## 2022-07-16 NOTE — Telephone Encounter (Signed)
Spoke to pt states she has been using inhaler and benadryl at night woke up a little better she wouldn't be able to come until Monday, says maybe by then she will be better.

## 2022-07-17 ENCOUNTER — Encounter (HOSPITAL_COMMUNITY): Payer: Medicare Other

## 2022-07-22 ENCOUNTER — Encounter (HOSPITAL_COMMUNITY): Payer: Self-pay

## 2022-07-22 ENCOUNTER — Ambulatory Visit (HOSPITAL_COMMUNITY): Admit: 2022-07-22 | Payer: Medicare Other | Admitting: Gastroenterology

## 2022-07-22 SURGERY — COLONOSCOPY WITH PROPOFOL
Anesthesia: Monitor Anesthesia Care

## 2022-07-27 ENCOUNTER — Encounter (INDEPENDENT_AMBULATORY_CARE_PROVIDER_SITE_OTHER): Payer: Self-pay | Admitting: Gastroenterology

## 2022-08-06 NOTE — Progress Notes (Deleted)
No chief complaint on file.   HISTORY OF PRESENT ILLNESS:  08/06/22 ALL:  Elizabeth [name] is a 81 y.o. female here today for follow up for  CVA. 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 She was seen in consult with Dr Krista Blue 01/2022. She was advised to continue Plavix and referred to speech therapy for swallowing eval. Since,   She continues Plavix, Repatha and Zetia (recently added). Stains contraindicated due to joint pain and elevated LFTs on simvastatin. LDL was 150. A1C 5.6. She is followed regualrly by PCP.   HISTORY (copied from Dr Rhea Belton previous note)  Elizabeth [name], seen in request by   Renee Rival, FNP    I reviewed and summarized the referring note. PMHX. HTN DM COPD, quit smoke in 2021. 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 Estanislado Pandy   She is a retired Marine scientist, highly functional prior to stroke in March, ambulate without difficulty, driving, on November 08, 2021, she had a sudden onset left leg weakness, fell, was treated at [facility] initially, MRI showed acute infarction involving right internal capsule, echocardiogram showed no significant abnormality  CT angiogram of neck showed no significant large vessel stenosis, angiogram of the brain showed multivessel intracranial atherosclerotic disease,   LDL 185, A1c 6.4, hemoglobin 11.7   She was on aspirin 325 mg daily, was put on Plavix 75 mg, continued during antiplatelet treatment since then, she had a history of GI bleeding, continue followed up by a local gastroenterologist,  She was discharged to [facility] rehab later, was not evaluated by stroke service, now receiving home rehab, making progress, mild left hemiparesis, ambulate with assistant,  The most bothersome symptoms is her wet speech, mild difficulty swallowing, which is improving also, but on today's interview, she has difficulty handling  her saliva, complains difficulty swallowing, food stuck in her throat, she is eating close to regular diet at home now, only choke occasionally.  REVIEW OF SYSTEMS: Out of a complete 14 system review of symptoms, the patient complains only of the following symptoms, and all other reviewed systems are negative.   ALLERGIES: Allergies  Allergen Reactions   Banana Anaphylaxis   Codeine Nausea And Vomiting and Other (See Comments)    PROJECTILE VOMITING   Stevia [Stevioside] Swelling and Other (See Comments)    Face, tongue, and eye swelling    Actos [Pioglitazone] Swelling   Banana Flavor    Benicar [Olmesartan Medoxomil] Other (See Comments)    Feel bad   Benicar [Olmesartan]    Chantix [Varenicline] Other (See Comments)    "Worked opposite."   Diovan [Valsartan] Other (See Comments)    Feel bad   Lipitor [Atorvastatin] Other (See Comments)    REACTION:  Joint pain   Rice Other (See Comments)    Gives patient indigestion   Spinach Other (See Comments)    Stomach problems   Statins Other (See Comments)    Feels bad, leg cramps, worries about liver effects.   Vicodin [Hydrocodone-Acetaminophen] Nausea And Vomiting   Zocor [Simvastatin] Other (See Comments)    REACTION:  "made liver function incorrectly"   Ace Inhibitors Cough   Celebrex [Celecoxib] Rash   Latex Rash and Other (See Comments)    REACTION: red rash   Metformin And Related     GI - upset    Mushroom Extract Complex Palpitations   Penicillins  Rash and Other (See Comments)    Has patient had a PCN reaction causing immediate rash, facial/tongue/throat swelling, SOB or lightheadedness with hypotension: No Has patient had a PCN reaction causing severe rash involving mucus membranes or skin necrosis: No Has patient had a PCN reaction that required hospitalization: No- MD office Has patient had a PCN reaction occurring within the last 10 years: No If all of the above answers are "NO", then may proceed with Cephalosporin  use.    Sulfonamide Derivatives Rash     HOME MEDICATIONS: Outpatient Medications Prior to Visit  Medication Sig Dispense Refill   albuterol (VENTOLIN HFA) 108 (90 Base) MCG/ACT inhaler INHALE 2 PUFFS EVERY SIX HOURS AS NEEDED FOR WHEEZING OR SHORTNESS OF BREATH 8.5 g 2   amLODipine (NORVASC) 10 MG tablet TAKE 1 TABLET BY MOUTH EVERY DAY (Patient taking differently: Take 10 mg by mouth daily.) 90 tablet 5   azelastine (ASTELIN) 0.1 % nasal spray Place 2 sprays into both nostrils 2 (two) times daily. Use in each nostril as directed 30 mL 12   clopidogrel (PLAVIX) 75 MG tablet Take 1 tablet (75 mg total) by mouth daily. 90 tablet 1   docusate sodium (COLACE) 100 MG capsule Take 1 capsule (100 mg total) by mouth daily. 30 capsule 0   empagliflozin (JARDIANCE) 10 MG TABS tablet Take 1 tablet (10 mg total) by mouth daily before breakfast. 14 tablet 0   Evolocumab (REPATHA SURECLICK) 169 MG/ML SOAJ Inject 1 mL into the skin every 14 (fourteen) days. 6 mL 3   ezetimibe (ZETIA) 10 MG tablet Take 1 tablet (10 mg total) by mouth daily. 90 tablet 3   Grape Seed OIL by Does not apply route daily at 6 (six) AM.     meclizine (ANTIVERT) 12.5 MG tablet Take 1 tablet (12.5 mg total) by mouth 3 (three) times daily as needed for dizziness. 15 tablet 0   methocarbamol (ROBAXIN) 500 MG tablet Take 1 tablet (500 mg total) by mouth every 6 (six) hours as needed for muscle spasms. 30 tablet 0   montelukast (SINGULAIR) 10 MG tablet TAKE 1 TABLET BY MOUTH AT BEDTIME 30 tablet 3   ondansetron (ZOFRAN) 4 MG tablet TAKE 1 TABLET BY MOUTH EVERY 6 HOURS 60 tablet 1   OVER THE COUNTER MEDICATION daily at 6 (six) AM. brain essense     oxybutynin (DITROPAN XL) 15 MG 24 hr tablet 1 tablet DAILY (route: oral)     pantoprazole (PROTONIX) 40 MG tablet TAKE 1 TABLET BY MOUTH DAILY BEFORE BREAKFAST 90 tablet 0   polyethylene glycol-electrolytes (TRILYTE) 420 g solution Take 4,000 mLs by mouth as directed. 4000 mL 0   ursodiol  (ACTIGALL) 300 MG capsule Take 1 capsule (300 mg total) by mouth 3 (three) times daily. 270 capsule 3   Vitamin D, Ergocalciferol, (DRISDOL) 1.25 MG (50000 UNIT) CAPS capsule Take 1 capsule (50,000 Units total) by mouth every 7 (seven) days. 5 capsule 2   No facility-administered medications prior to visit.     PAST MEDICAL HISTORY: Past Medical History:  Diagnosis Date   Absolute anemia 07/28/2018   Active smoker    Allergy    Anxiety    Takes Xanax for anxiety   Arthritis    Asthma    Cataract    Chronic airway obstruction, not elsewhere classified    Clotting disorder ([place])    stoke    Depression    Diarrhea    Diverticulosis of colon (without mention of hemorrhage)  Esophageal reflux    Family history of malignant neoplasm of gastrointestinal tract    GI bleed    Headache(784.0)    Irregular   Neuromuscular disorder (HCC)    numbness is left hand and left elbow   Obesity, unspecified    Other and unspecified hyperlipidemia    Personal history of colonic polyps    Stricture and stenosis of esophagus    Stroke ([place])    Thyroid disease    Type II or unspecified type diabetes mellitus without mention of complication, not stated as uncontrolled    Unspecified asthma(493.90)    Unspecified essential hypertension      PAST SURGICAL HISTORY: Past Surgical History:  Procedure Laterality Date   APPENDECTOMY     BACK SURGERY     Spinal surgery   BRAIN SURGERY     2013 - stoke ( fall in 2010)    CARDIAC CATHETERIZATION  2006   LAD: 30%, RCA : 20%, normal EF, elevated LVEDP   CARDIAC CATHETERIZATION     CEREBRAL ANGIOGRAM  12/20/2015   COLONOSCOPY N/A 08/09/2018   Procedure: COLONOSCOPY;  Surgeon: Rogene Houston, MD;  [facility];  Service: Endoscopy;  Laterality: N/A;  1:55   ESOPHAGEAL DILATION N/A 08/09/2018   Procedure: ESOPHAGEAL DILATION;  Surgeon: Rogene Houston, MD;  [facility];  Service: Endoscopy;  Laterality: N/A;    ESOPHAGOGASTRODUODENOSCOPY N/A 08/09/2018   Procedure: ESOPHAGOGASTRODUODENOSCOPY (EGD);  Surgeon: Rogene Houston, MD;  [facility];  Service: Endoscopy;  Laterality: N/A;   GIVENS CAPSULE STUDY N/A 08/25/2018   Procedure: GIVENS CAPSULE STUDY;  Surgeon: Rogene Houston, MD;  [facility];  Service: Endoscopy;  Laterality: N/A;   IR ANGIO INTRA EXTRACRAN SEL COM CAROTID INNOMINATE BILAT MOD SED  01/14/2018   IR ANGIO INTRA EXTRACRAN SEL COM CAROTID INNOMINATE BILAT MOD SED  05/11/2018   IR ANGIO VERTEBRAL SEL SUBCLAVIAN INNOMINATE UNI L MOD SED  01/14/2018   IR ANGIO VERTEBRAL SEL SUBCLAVIAN INNOMINATE UNI L MOD SED  05/11/2018   IR ANGIO VERTEBRAL SEL VERTEBRAL UNI R MOD SED  01/14/2018   IR ANGIO VERTEBRAL SEL VERTEBRAL UNI R MOD SED  05/11/2018   KNEE ARTHROSCOPY     left   LUMBAR DISC SURGERY     POLYPECTOMY  08/09/2018   Procedure: POLYPECTOMY;  Surgeon: Rogene Houston, MD;  [facility];  Service: Endoscopy;;  ascending colon (CS x 2, HS x5) hepatic flexure (HSx1), recto-sigmoid (HSx1)   TONSILLECTOMY     TOTAL ABDOMINAL HYSTERECTOMY     menopause/bleeding     FAMILY HISTORY: Family History  Problem Relation Age of Onset   Diabetes Mother    Heart disease Mother    Asthma Mother    Kidney disease Mother    Drug abuse Mother    Hypertension Mother    Stroke Mother        fall -several bleed on brain 10   Early death Father        trauma   Colon cancer Other        first cousin, paternal aunt and uncle   Cancer Paternal Grandmother        gastric   Diabetes Paternal Grandmother    Mental illness Paternal Grandmother    Heart disease Maternal Grandmother    Thyroid cancer Other        aunt   Heart disease Sister    Mental illness Sister  Heart attack Sister    Heart disease Brother    Lung disease Brother    Cancer Sister        pancreatic   Diabetes Sister    Hypertension Sister    Tuberculosis Maternal Grandfather    Heart disease  Paternal Grandfather      SOCIAL HISTORY: Social History   Socioeconomic History   Marital status: Married    Spouse name: [name]   Number of children: 0   Years of education: 16   Highest education level: Bachelor's degree (e.g., BA, AB, BS)  Occupational History   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   Tobacco Use   Smoking status: Every Day    Types: E-cigarettes    Passive exposure: Current   Smokeless tobacco: Never  Vaping Use   Vaping Use: Some days   Substances: THC, CBD, Flavoring, Nicotine-salt, Synthetic cannabinoids, Mixture of cannabinoids, Homemade substance  Substance and Sexual Activity   Alcohol use: No    Alcohol/week: 0.0 standard drinks of alcohol   Drug use: No   Sexual activity: Not Currently  Other Topics Concern   Not on file  Social History Narrative   Married [name]   Limited activity due to back pain   Social Determinants of Health   Financial Resource Strain: Low Risk  (01/28/2021)   Overall Financial Resource Strain (CARDIA)    Difficulty of Paying Living Expenses: Not hard at all  Food Insecurity: No Food Insecurity (01/28/2021)   Hunger Vital Sign    Worried About Running Out of Food in the Last Year: Never true    Ran Out of Food in the Last Year: Never true  Transportation Needs: No Transportation Needs (01/28/2021)   PRAPARE - Hydrologist (Medical): No    Lack of Transportation (Non-Medical): No  Physical Activity: Insufficiently Active (01/28/2021)   Exercise Vital Sign    Days of Exercise per Week: 3 days    Minutes of Exercise per Session: 20 min  Stress: No Stress Concern Present (01/28/2021)   [place]    Feeling of Stress : Not at all  Social Connections: Moderately Isolated (01/28/2021)   Social Connection and Isolation Panel [NHANES]    Frequency of Communication with Friends and Family: More than three times  a week    Frequency of Social Gatherings with Friends and Family: More than three times a week    Attends Religious Services: Never    Marine scientist or Organizations: No    Attends Archivist Meetings: Never    Marital Status: Married  Human resources officer Violence: Not At Risk (01/28/2021)   Humiliation, Afraid, Rape, and Kick questionnaire    Fear of Current or Ex-Partner: No    Emotionally Abused: No    Physically Abused: No    Sexually Abused: No     PHYSICAL EXAM  There were no vitals filed for this visit. There is no height or weight on file to calculate BMI.  Generalized: Well developed, in no acute distress  Cardiology: normal rate and rhythm, no murmur auscultated  Respiratory: clear to auscultation bilaterally    Neurological examination  Mentation: Alert oriented to time, place, history taking. Follows all commands speech and language fluent Cranial nerve II-XII: Pupils were equal round reactive to light. Extraocular movements were full, visual field were full on confrontational test. Facial sensation and strength were normal.  Uvula tongue midline. Head turning and shoulder shrug  were normal and symmetric. Motor: The motor testing reveals 5 over 5 strength of all 4 extremities. Good symmetric motor tone is noted throughout.  Sensory: Sensory testing is intact to soft touch on all 4 extremities. No evidence of extinction is noted.  Coordination: Cerebellar testing reveals good finger-nose-finger and heel-to-shin bilaterally.  Gait and station: Gait is normal. Tandem gait is normal. Romberg is negative. No drift is seen.  Reflexes: Deep tendon reflexes are symmetric and normal bilaterally.    DIAGNOSTIC DATA (LABS, IMAGING, TESTING) - I reviewed patient records, labs, notes, testing and imaging myself where available.  Lab Results  Component Value Date   WBC 7.3 06/05/2022   HGB 14.9 06/05/2022   HCT 46.4 06/05/2022   MCV 77 (L) 06/05/2022   PLT 283  06/05/2022      Component Value Date/Time   NA 133 (L) 06/12/2022 0902   K 3.3 (L) 06/12/2022 0902   CL 93 (L) 06/12/2022 0902   CO2 24 06/12/2022 0902   GLUCOSE 164 (H) 06/12/2022 0902   GLUCOSE 103 (H) 12/10/2021 0506   BUN 13 06/12/2022 0902   CREATININE 0.73 06/12/2022 0902   CREATININE 0.71 08/09/2013 0828   CALCIUM 10.4 (H) 06/12/2022 0902   PROT 6.8 06/12/2022 0902   ALBUMIN 4.4 06/12/2022 0902   AST 18 06/12/2022 0902   ALT 12 06/12/2022 0902   ALKPHOS 111 06/12/2022 0902   BILITOT 0.7 06/12/2022 0902   GFRNONAA >60 12/10/2021 0506   GFRNONAA 87 08/09/2013 0828   GFRAA 84 10/03/2020 0838   GFRAA >89 08/09/2013 0828   Lab Results  Component Value Date   CHOL 227 (H) 06/12/2022   HDL 57 06/12/2022   LDLCALC 150 (H) 06/12/2022   TRIG 114 06/12/2022   CHOLHDL 4.0 06/12/2022   Lab Results  Component Value Date   HGBA1C 5.6 06/12/2022   Lab Results  Component Value Date   VITAMINB12 642 11/14/2020   Lab Results  Component Value Date   TSH 1.170 06/12/2022        No data to display               No data to display           ASSESSMENT AND PLAN  81 y.o. year old female  has a past medical history of Absolute anemia (07/28/2018), Active smoker, Allergy, Anxiety, Arthritis, Asthma, Cataract, Chronic airway obstruction, not elsewhere classified, Clotting disorder ([place]), Depression, Diarrhea, Diverticulosis of colon (without mention of hemorrhage), Esophageal reflux, Family history of malignant neoplasm of gastrointestinal tract, GI bleed, Headache(784.0), Neuromuscular disorder ([place]), Obesity, unspecified, Other and unspecified hyperlipidemia, Personal history of colonic polyps, Stricture and stenosis of esophagus, Stroke ([place]), Thyroid disease, Type II or unspecified type diabetes mellitus without mention of complication, not stated as uncontrolled, Unspecified asthma(493.90), and Unspecified essential hypertension. here with    No diagnosis  found.  Elizabeth [name] ***.  Healthy lifestyle habits encouraged. *** will follow up with PCP as directed. *** will return to see me in ***, sooner if needed. *** verbalizes understanding and agreement with this plan.   No orders of the defined types were placed in this encounter.    No orders of the defined types were placed in this encounter.    Debbora Presto, MSN, FNP-C 08/06/2022, 7:47 AM  [facility] [address] [place], [place] [address] [phone]

## 2022-08-06 NOTE — Patient Instructions (Incomplete)

## 2022-08-07 ENCOUNTER — Ambulatory Visit: Payer: Medicare Other | Admitting: Family Medicine

## 2022-08-07 ENCOUNTER — Encounter: Payer: Self-pay | Admitting: Family Medicine

## 2022-08-07 DIAGNOSIS — I6330 Cerebral infarction due to thrombosis of unspecified cerebral artery: Secondary | ICD-10-CM

## 2022-08-11 ENCOUNTER — Ambulatory Visit (INDEPENDENT_AMBULATORY_CARE_PROVIDER_SITE_OTHER): Payer: Medicare Other | Admitting: Gastroenterology

## 2022-08-11 ENCOUNTER — Encounter (INDEPENDENT_AMBULATORY_CARE_PROVIDER_SITE_OTHER): Payer: Self-pay | Admitting: Gastroenterology

## 2022-08-18 ENCOUNTER — Telehealth: Payer: Self-pay | Admitting: Family Medicine

## 2022-08-18 NOTE — Telephone Encounter (Signed)
Pt needs a refill on albuterol inhaler, pt states she had a fall yesterday morning, c/o low back pain. Would like to have something called in for this, please advice.

## 2022-08-18 NOTE — Telephone Encounter (Signed)
Pt called wanting to know if a nurse can please give her a call about a medication & her falling this am?

## 2022-08-18 NOTE — Telephone Encounter (Signed)
Kindly refill her albuterol and Robaxin 500 mg nightly.  Please inform the patient that Robaxin can cause drowsiness and only to take the medication at nighttime. Please encourage the patient to take Tylenol 650 mg every 8 hours as needed for pain control.  I recommend applying heat to her lower back at 20 to 30-minute intervals for pain relief.  If her symptoms persist, I advised that she make a televisit or come in to be seen for further evaluations.

## 2022-08-19 ENCOUNTER — Other Ambulatory Visit: Payer: Self-pay

## 2022-08-19 DIAGNOSIS — B9689 Other specified bacterial agents as the cause of diseases classified elsewhere: Secondary | ICD-10-CM

## 2022-08-19 DIAGNOSIS — M545 Low back pain, unspecified: Secondary | ICD-10-CM

## 2022-08-19 DIAGNOSIS — J441 Chronic obstructive pulmonary disease with (acute) exacerbation: Secondary | ICD-10-CM

## 2022-08-19 MED ORDER — METHOCARBAMOL 500 MG PO TABS: 500.0000 mg | ORAL_TABLET | Freq: Four times a day (QID) | ORAL | 0 refills | Status: DC | PRN

## 2022-08-19 MED ORDER — ALBUTEROL SULFATE HFA 108 (90 BASE) MCG/ACT IN AERS: INHALATION_SPRAY | RESPIRATORY_TRACT | 2 refills | Status: DC

## 2022-08-19 NOTE — Telephone Encounter (Signed)
Pt informed

## 2022-08-24 ENCOUNTER — Other Ambulatory Visit: Payer: Self-pay | Admitting: Orthopedic Surgery

## 2022-08-24 IMAGING — MR MR MRA HEAD W/O CM
1 series · 31 of 48 positions shown · non-contrast
Comparison: 03/21/2020

CLINICAL DATA: Follow-up anterior communicating artery aneurysm
treated with coiling.

EXAM:
MRA HEAD WITHOUT CONTRAST
TECHNIQUE: Angiographic images of the Circle of Willis were obtained using MRA
technique without intravenous contrast.

[Series 2: TOF · axial · 0.5mm · 0.54mm/px · z∈[-96,+21]mm · 31 of 264 slices shown]
[im 1/264]
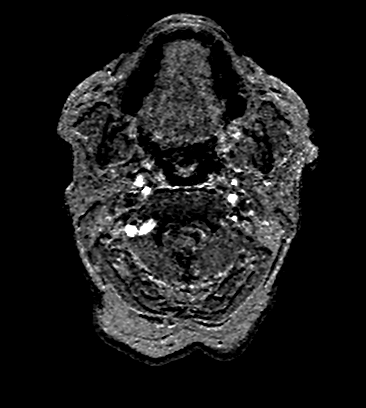
[im 6/264]
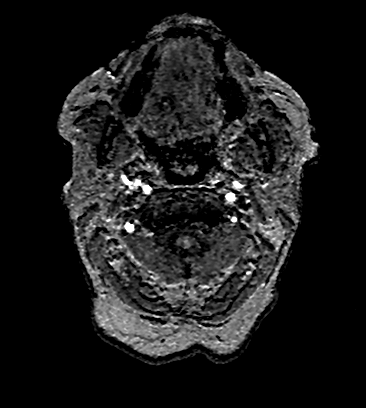
[im 12/264]
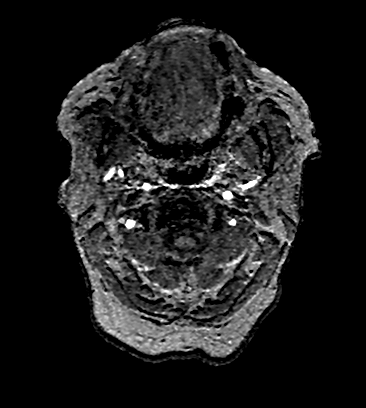
[im 17/264]
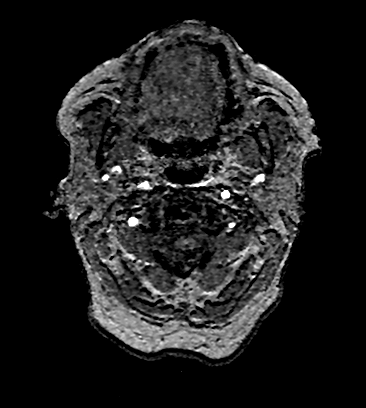
[im 23/264]
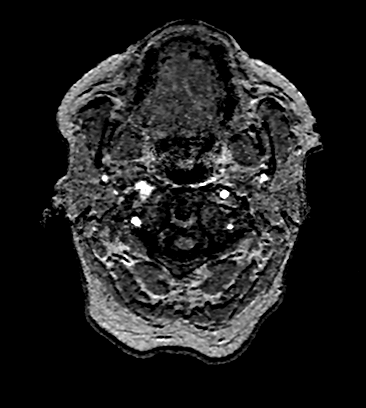
[im 28/264]
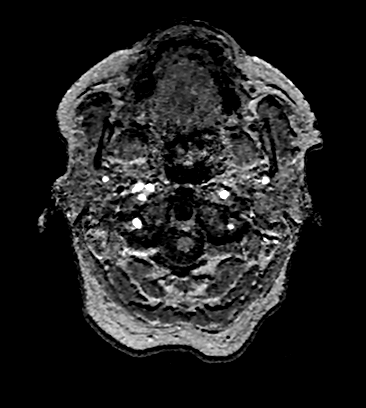
[im 34/264]
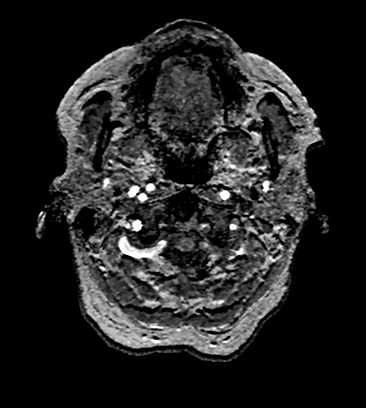
[im 40/264]
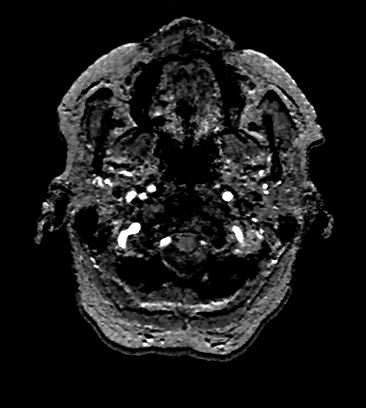
[im 45/264]
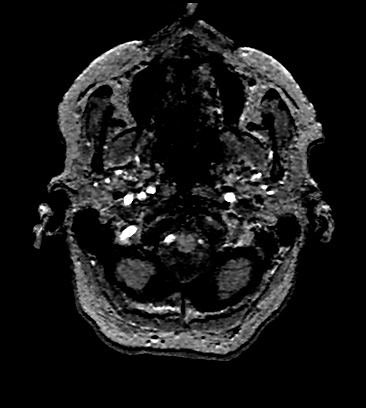
[im 51/264]
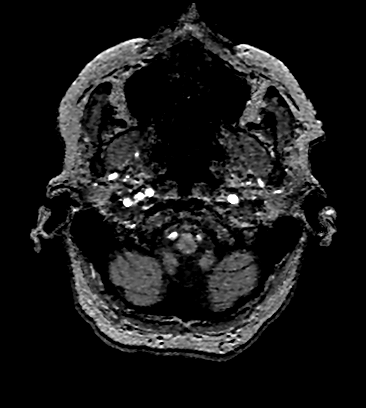
[im 56/264]
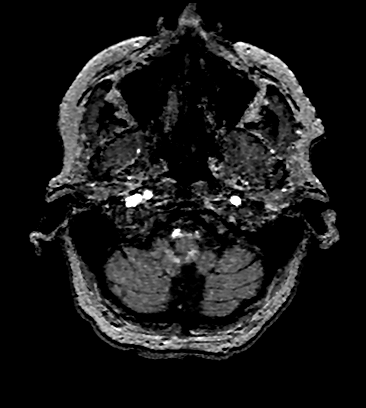
[im 62/264]
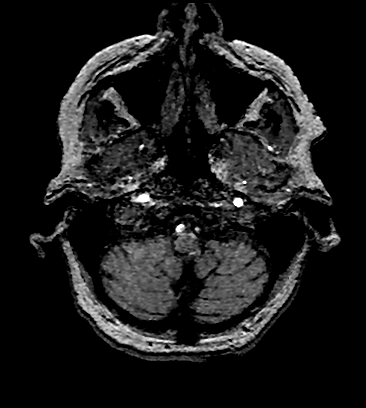
[im 68/264]
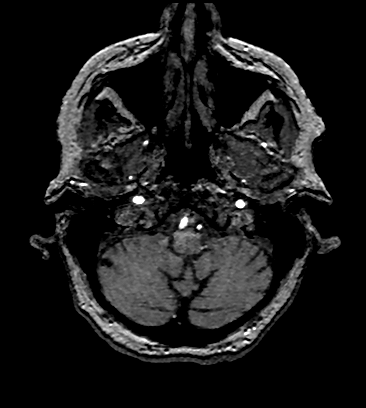
[im 73/264]
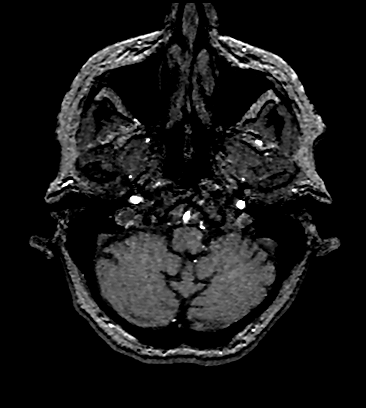
[im 79/264]
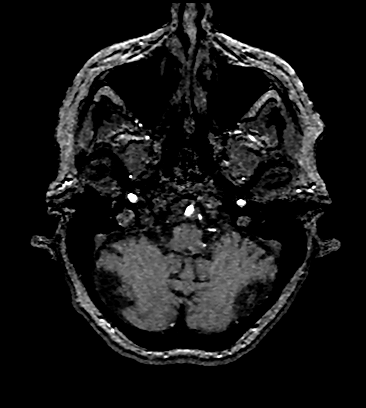
[im 84/264]
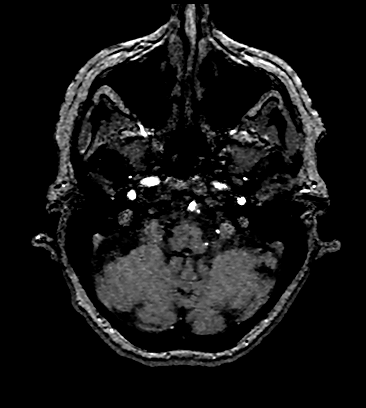
[im 90/264]
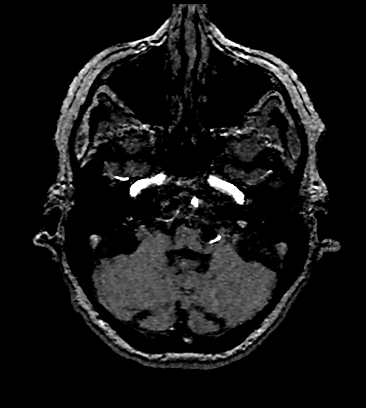
[im 96/264]
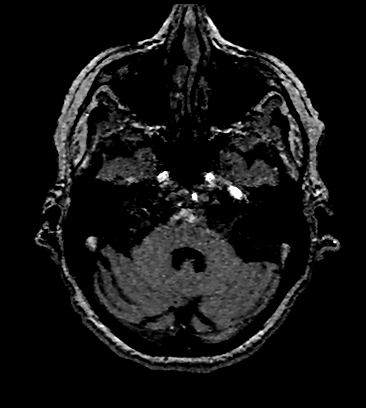
[im 101/264]
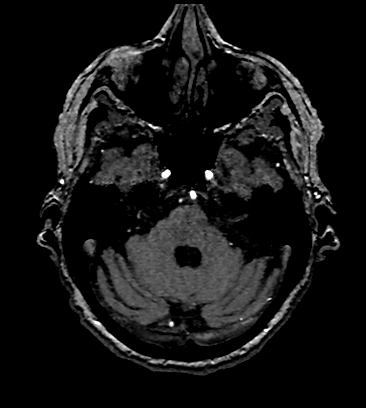
[im 107/264]
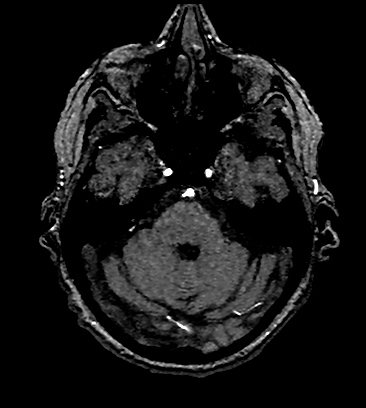
[im 112/264]
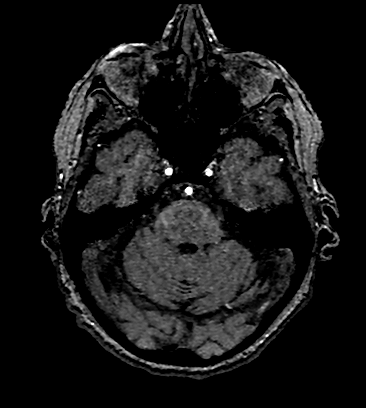
[im 118/264]
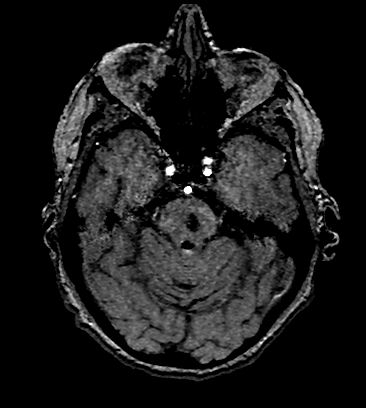
[im 124/264]
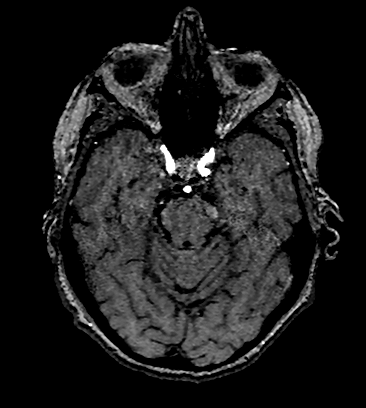
[im 129/264]
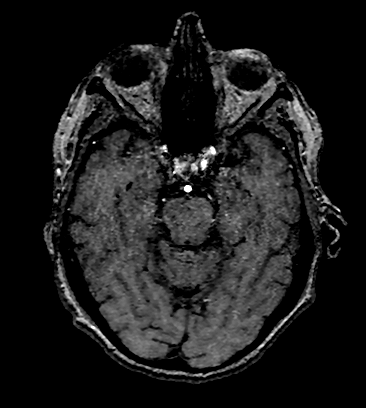
[im 135/264]
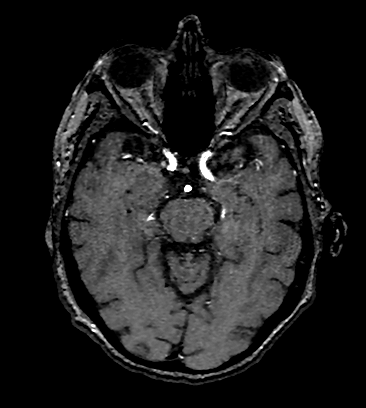
[im 140/264]
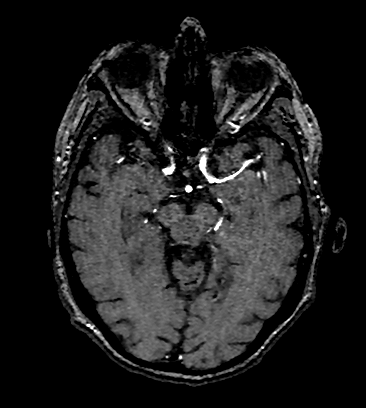
[im 152/264]
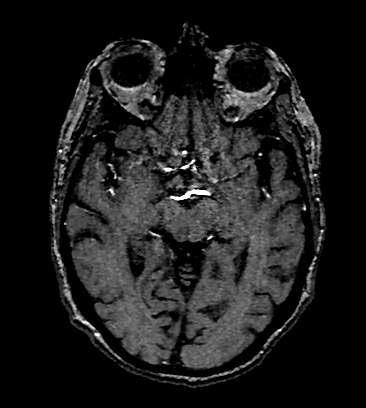
[im 185/264]
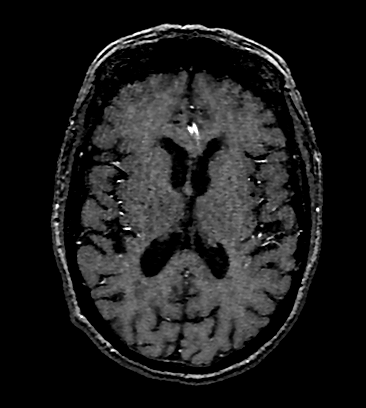
[im 219/264]
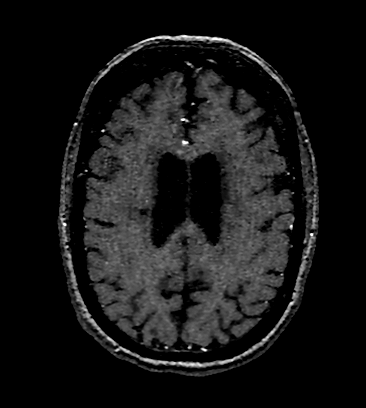
[im 224/264]
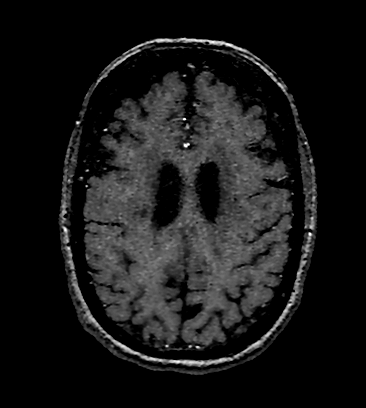
[im 252/264]
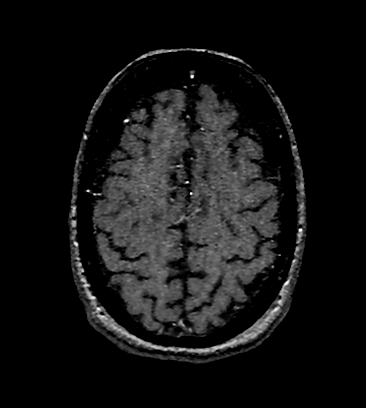

[31 of 48 positions shown; findings below may reference images not displayed]

FINDINGS: The visualized distal vertebral arteries are patent to the basilar
with the right being strongly dominant. Signal loss in the distal V3
and proximal V4 segments on the left is favored to be technical and
related to vessel orientation and small vessel size. The left PICA
and right AICA appear dominant. Patent SCAs are seen bilaterally.

The basilar artery is patent with unchanged mild narrowing
proximally. Posterior communicating arteries are not identified and
may be diminutive or absent. Both PCAs are patent with chronic
irregularity which is more prominent on today's study with distal
branch vessel attenuation with apparent changes potentially being
technical as there is greater noise on today's study. No definite
flow limiting proximal PCA stenosis is evident.

The internal carotid arteries are patent from the included distal
cervical segments through the termini. The distal right cervical ICA
is tortuous. There is asymmetric signal loss in the right ICA at the
level of the anterior genu which is at least in part technical. A
stenosis is not excluded in this region, however no significant
stenosis was present on the prior study.

ACAs and MCAs are patent without evidence of a proximal branch
occlusion. There is mild multifocal narrowing of both M1 segments
which likely reflects a combination of atherosclerosis and artifact
with the prior study demonstrating mild narrowing at the level of
the right MCA bifurcation. There is also mild irregularity and
attenuation of bilateral MCA branch vessels.

Previous coiling of an anterior communicating aneurysm is again
noted with mild susceptibility artifact. An approximately 4 x 1.5 mm
region of residual flow within the right aspect of the aneurysm has
not significantly changed (series 2, image 152). No new aneurysm is
identified.
IMPRESSION: 1. Unchanged small amount of residual flow within a treated anterior
communicating aneurysm.
2. Intracranial atherosclerosis with differences between the current
and prior MRA felt to primarily be due to technical factors.

## 2022-09-02 ENCOUNTER — Other Ambulatory Visit: Payer: Self-pay | Admitting: Family Medicine

## 2022-09-08 ENCOUNTER — Other Ambulatory Visit: Payer: Self-pay | Admitting: Nurse Practitioner

## 2022-09-10 ENCOUNTER — Encounter: Payer: Self-pay | Admitting: Family Medicine

## 2022-09-10 ENCOUNTER — Ambulatory Visit (INDEPENDENT_AMBULATORY_CARE_PROVIDER_SITE_OTHER): Payer: Medicare Other | Admitting: Family Medicine

## 2022-09-10 VITALS — BP 116/68 | HR 68 | Ht 62.0 in | Wt 121.0 lb

## 2022-09-10 DIAGNOSIS — B349 Viral infection, unspecified: Secondary | ICD-10-CM | POA: Diagnosis not present

## 2022-09-10 MED ORDER — PROMETHAZINE-DM 6.25-15 MG/5ML PO SYRP: 5.0000 mL | ORAL_SOLUTION | Freq: Four times a day (QID) | ORAL | 0 refills | Status: DC | PRN

## 2022-09-10 NOTE — Progress Notes (Signed)
Acute Office Visit  Subjective:    Patient ID: Elizabeth [name], female    DOB: 1940/12/06, 82 y.o.   MRN: [id number]  Chief Complaint  Patient presents with   Cough    Pt reports having a cough, nose stopped up sx started 09/07/2023    Cough Associated symptoms include rhinorrhea. Pertinent negatives include no chills, fever, headaches or sore throat.   Patient is in today complaints of cough and congestion since 09/06/2022.  She  reports rhinorrhea and maxillary sinus pressure and  denies fever, sore throat, body aches and pain, headaches.  She reports negative COVID test at home.  Past Medical History:  Diagnosis Date   Absolute anemia 07/28/2018   Active smoker    Allergy    Anxiety    Takes Xanax for anxiety   Arthritis    Asthma    Cataract    Chronic airway obstruction, not elsewhere classified    Clotting disorder ([place])    stoke    Depression    Diarrhea    Diverticulosis of colon (without mention of hemorrhage)    Esophageal reflux    Family history of malignant neoplasm of gastrointestinal tract    GI bleed    Headache(784.0)    Irregular   Neuromuscular disorder (HCC)    numbness is left hand and left elbow   Obesity, unspecified    Other and unspecified hyperlipidemia    Personal history of colonic polyps    Stricture and stenosis of esophagus    Stroke ([place])    Thyroid disease    Type II or unspecified type diabetes mellitus without mention of complication, not stated as uncontrolled    Unspecified asthma(493.90)    Unspecified essential hypertension     Past Surgical History:  Procedure Laterality Date   APPENDECTOMY     BACK SURGERY     Spinal surgery   BRAIN SURGERY     2013 - stoke ( fall in 2010)    CARDIAC CATHETERIZATION  2006   LAD: 30%, RCA : 20%, normal EF, elevated LVEDP   CARDIAC CATHETERIZATION     CEREBRAL ANGIOGRAM  12/20/2015   COLONOSCOPY N/A 08/09/2018   Procedure: COLONOSCOPY;  Surgeon: Rogene Houston, MD;  [facility];  Service: Endoscopy;  Laterality: N/A;  1:55   ESOPHAGEAL DILATION N/A 08/09/2018   Procedure: ESOPHAGEAL DILATION;  Surgeon: Rogene Houston, MD;  [facility]: [facility];  Service: Endoscopy;  Laterality: N/A;   ESOPHAGOGASTRODUODENOSCOPY N/A 08/09/2018   Procedure: ESOPHAGOGASTRODUODENOSCOPY (EGD);  Surgeon: Rogene Houston, MD;  [facility];  Service: Endoscopy;  Laterality: N/A;   GIVENS CAPSULE STUDY N/A 08/25/2018   Procedure: GIVENS CAPSULE STUDY;  Surgeon: Rogene Houston, MD;  [facility];  Service: Endoscopy;  Laterality: N/A;   IR ANGIO INTRA EXTRACRAN SEL COM CAROTID INNOMINATE BILAT MOD SED  01/14/2018   IR ANGIO INTRA EXTRACRAN SEL COM CAROTID INNOMINATE BILAT MOD SED  05/11/2018   IR ANGIO VERTEBRAL SEL SUBCLAVIAN INNOMINATE UNI L MOD SED  01/14/2018   IR ANGIO VERTEBRAL SEL SUBCLAVIAN INNOMINATE UNI L MOD SED  05/11/2018   IR ANGIO VERTEBRAL SEL VERTEBRAL UNI R MOD SED  01/14/2018   IR ANGIO VERTEBRAL SEL VERTEBRAL UNI R MOD SED  05/11/2018   KNEE ARTHROSCOPY     left   LUMBAR DISC SURGERY     POLYPECTOMY  08/09/2018   Procedure: POLYPECTOMY;  Surgeon: Rogene Houston, MD;  [facility]:  [facility];  Service: Endoscopy;;  ascending colon (CS x 2, HS x5) hepatic flexure (HSx1), recto-sigmoid (HSx1)   TONSILLECTOMY     TOTAL ABDOMINAL HYSTERECTOMY     menopause/bleeding    Family History  Problem Relation Age of Onset   Diabetes Mother    Heart disease Mother    Asthma Mother    Kidney disease Mother    Drug abuse Mother    Hypertension Mother    Stroke Mother        fall -several bleed on brain 56   Early death Father        trauma   Colon cancer Other        first cousin, paternal aunt and uncle   Cancer Paternal Grandmother        gastric   Diabetes Paternal Grandmother    Mental illness Paternal Grandmother    Heart disease Maternal Grandmother    Thyroid cancer Other        aunt   Heart disease Sister    Mental illness Sister     Heart attack Sister    Heart disease Brother    Lung disease Brother    Cancer Sister        pancreatic   Diabetes Sister    Hypertension Sister    Tuberculosis Maternal Grandfather    Heart disease Paternal Grandfather     Social History   Socioeconomic History   Marital status: Married    Spouse name: [name]   Number of children: 0   Years of education: 16   Highest education level: Bachelor's degree (e.g., BA, AB, BS)  Occupational History   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   Tobacco Use   Smoking status: Every Day    Types: E-cigarettes    Passive exposure: Current   Smokeless tobacco: Never  Vaping Use   Vaping Use: Some days   Substances: THC, CBD, Flavoring, Nicotine-salt, Synthetic cannabinoids, Mixture of cannabinoids, Homemade substance  Substance and Sexual Activity   Alcohol use: No    Alcohol/week: 0.0 standard drinks of alcohol   Drug use: No   Sexual activity: Not Currently  Other Topics Concern   Not on file  Social History Narrative   Married [name]   Limited activity due to back pain   Social Determinants of Health   Financial Resource Strain: Low Risk  (01/28/2021)   Overall Financial Resource Strain (CARDIA)    Difficulty of Paying Living Expenses: Not hard at all  Food Insecurity: No Food Insecurity (01/28/2021)   Hunger Vital Sign    Worried About Running Out of Food in the Last Year: Never true    Ran Out of Food in the Last Year: Never true  Transportation Needs: No Transportation Needs (01/28/2021)   PRAPARE - Hydrologist (Medical): No    Lack of Transportation (Non-Medical): No  Physical Activity: Insufficiently Active (01/28/2021)   Exercise Vital Sign    Days of Exercise per Week: 3 days    Minutes of Exercise per Session: 20 min  Stress: No Stress Concern Present (01/28/2021)   [place]    Feeling of Stress : Not  at all  Social Connections: Moderately Isolated (01/28/2021)   Social Connection and Isolation Panel [NHANES]    Frequency of Communication with Friends and Family: More than three times a week    Frequency of Social  Gatherings with Friends and Family: More than three times a week    Attends Religious Services: Never    Marine scientist or Organizations: No    Attends Archivist Meetings: Never    Marital Status: Married  Human resources officer Violence: Not At Risk (01/28/2021)   Humiliation, Afraid, Rape, and Kick questionnaire    Fear of Current or Ex-Partner: No    Emotionally Abused: No    Physically Abused: No    Sexually Abused: No    Outpatient Medications Prior to Visit  Medication Sig Dispense Refill   albuterol (VENTOLIN HFA) 108 (90 Base) MCG/ACT inhaler Dx: J44.9 8.5 g 2   amLODipine (NORVASC) 10 MG tablet TAKE 1 TABLET BY MOUTH EVERY DAY (Patient taking differently: Take 10 mg by mouth daily.) 90 tablet 5   azelastine (ASTELIN) 0.1 % nasal spray Place 2 sprays into both nostrils 2 (two) times daily. Use in each nostril as directed 30 mL 12   clopidogrel (PLAVIX) 75 MG tablet TAKE 1 TABLET BY MOUTH DAILY 90 tablet 1   docusate sodium (COLACE) 100 MG capsule Take 1 capsule (100 mg total) by mouth daily. 30 capsule 0   empagliflozin (JARDIANCE) 10 MG TABS tablet Take 1 tablet (10 mg total) by mouth daily before breakfast. 14 tablet 0   Evolocumab (REPATHA SURECLICK) 503 MG/ML SOAJ Inject 1 mL into the skin every 14 (fourteen) days. 6 mL 3   ezetimibe (ZETIA) 10 MG tablet Take 1 tablet (10 mg total) by mouth daily. 90 tablet 3   Grape Seed OIL by Does not apply route daily at 6 (six) AM.     meclizine (ANTIVERT) 12.5 MG tablet Take 1 tablet (12.5 mg total) by mouth 3 (three) times daily as needed for dizziness. 15 tablet 0   methocarbamol (ROBAXIN) 500 MG tablet Take 1 tablet (500 mg total) by mouth every 6 (six) hours as needed for muscle spasms. 30 tablet 0    montelukast (SINGULAIR) 10 MG tablet TAKE 1 TABLET BY MOUTH AT BEDTIME 30 tablet 3   ondansetron (ZOFRAN) 4 MG tablet TAKE 1 TABLET BY MOUTH EVERY 6 HOURS 60 tablet 1   OVER THE COUNTER MEDICATION daily at 6 (six) AM. brain essense     oxybutynin (DITROPAN XL) 15 MG 24 hr tablet 1 tablet DAILY (route: oral)     pantoprazole (PROTONIX) 40 MG tablet TAKE 1 TABLET BY MOUTH DAILY BEFORE BREAKFAST 90 tablet 0   polyethylene glycol-electrolytes (TRILYTE) 420 g solution Take 4,000 mLs by mouth as directed. 4000 mL 0   ursodiol (ACTIGALL) 300 MG capsule Take 1 capsule (300 mg total) by mouth 3 (three) times daily. 270 capsule 3   Vitamin D, Ergocalciferol, (DRISDOL) 1.25 MG (50000 UNIT) CAPS capsule Take 1 capsule (50,000 Units total) by mouth every 7 (seven) days. 5 capsule 2   No facility-administered medications prior to visit.    Allergies  Allergen Reactions   Banana Anaphylaxis   Codeine Nausea And Vomiting and Other (See Comments)    PROJECTILE VOMITING   Stevia [Stevioside] Swelling and Other (See Comments)    Face, tongue, and eye swelling    Actos [Pioglitazone] Swelling   Banana Flavor    Benicar [Olmesartan Medoxomil] Other (See Comments)    Feel bad   Benicar [Olmesartan]    Chantix [Varenicline] Other (See Comments)    "Worked opposite."   Diovan [Valsartan] Other (See Comments)    Feel bad   Lipitor [Atorvastatin] Other (See Comments)  REACTION:  Joint pain   Rice Other (See Comments)    Gives patient indigestion   Spinach Other (See Comments)    Stomach problems   Statins Other (See Comments)    Feels bad, leg cramps, worries about liver effects.   Vicodin [Hydrocodone-Acetaminophen] Nausea And Vomiting   Zocor [Simvastatin] Other (See Comments)    REACTION:  "made liver function incorrectly"   Ace Inhibitors Cough   Celebrex [Celecoxib] Rash   Latex Rash and Other (See Comments)    REACTION: red rash   Metformin And Related     GI - upset    Mushroom Extract  Complex Palpitations   Penicillins Rash and Other (See Comments)    Has patient had a PCN reaction causing immediate rash, facial/tongue/throat swelling, SOB or lightheadedness with hypotension: No Has patient had a PCN reaction causing severe rash involving mucus membranes or skin necrosis: No Has patient had a PCN reaction that required hospitalization: No- MD office Has patient had a PCN reaction occurring within the last 10 years: No If all of the above answers are "NO", then may proceed with Cephalosporin use.    Sulfonamide Derivatives Rash    Review of Systems  Constitutional:  Negative for chills and fever.  HENT:  Positive for congestion, rhinorrhea and sinus pressure. Negative for sore throat.   Respiratory:  Positive for cough.   Neurological:  Negative for headaches.       Objective:    Physical Exam HENT:     Head: Normocephalic.     Nose: Congestion present.     Mouth/Throat:     Mouth: Mucous membranes are moist.     Pharynx: No oropharyngeal exudate or posterior oropharyngeal erythema.  Cardiovascular:     Rate and Rhythm: Normal rate.     Heart sounds: Normal heart sounds.  Pulmonary:     Effort: Pulmonary effort is normal.     Breath sounds: Normal breath sounds.  Neurological:     Mental Status: She is alert.     BP 116/68   Pulse 68   Ht '5\' 2"'$  (1.575 m)   Wt 121 lb 0.6 oz (54.9 kg)   SpO2 96%   BMI 22.14 kg/m  Wt Readings from Last 3 Encounters:  09/10/22 121 lb 0.6 oz (54.9 kg)  06/27/22 116 lb (52.6 kg)  06/26/22 105 lb (47.6 kg)       Assessment & Plan:  Viral illness Assessment & Plan: Encouraged to take Promethazine DM for cough and congestion Encouraged to continue taking azelastine nasal spray for congestion Take medication as prescribed. Increase fluids and allow for plenty of rest. Recommend Tylenol or ibuprofen as needed for pain, fever, or general discomfort. Warm salt water gargles 3-4 times daily to help with throat pain or  discomfort. Recommend using a humidifier at bedtime during sleep to help with cough and nasal congestion. Follow-up if your symptoms do not improve    Orders: -     Promethazine-DM; Take 5 mLs by mouth 4 (four) times daily as needed for cough.  Dispense: 118 mL; Refill: 0    Alvira Monday, FNP

## 2022-09-10 NOTE — Assessment & Plan Note (Signed)
Encouraged to take Promethazine DM for cough and congestion Encouraged to continue taking azelastine nasal spray for congestion Take medication as prescribed. Increase fluids and allow for plenty of rest. Recommend Tylenol or ibuprofen as needed for pain, fever, or general discomfort. Warm salt water gargles 3-4 times daily to help with throat pain or discomfort. Recommend using a humidifier at bedtime during sleep to help with cough and nasal congestion. Follow-up if your symptoms do not improve

## 2022-09-10 NOTE — Patient Instructions (Signed)
I appreciate the opportunity to provide care to you today!    Follow up:  as needed  Take medication as prescribed. Increase fluids and allow for plenty of rest. Recommend Tylenol or ibuprofen as needed for pain, fever, or general discomfort. Warm salt water gargles 3-4 times daily to help with throat pain or discomfort. Recommend using a heated humidifier at bedtime during sleep to help with cough and nasal congestion. Follow-up if your symptoms do not improve     Please continue to a heart-healthy diet and increase your physical activities. Try to exercise for 90mns at least three times a week.      It was a pleasure to see you and I look forward to continuing to work together on your health and well-being. Please do not hesitate to call the office if you need care or have questions about your care.   Have a wonderful day and week. With Gratitude, GAlvira MondayMSN, FNP-BC

## 2022-09-12 ENCOUNTER — Other Ambulatory Visit: Payer: Self-pay | Admitting: Family Medicine

## 2022-09-12 DIAGNOSIS — E559 Vitamin D deficiency, unspecified: Secondary | ICD-10-CM

## 2022-09-12 MED ORDER — VITAMIN D (ERGOCALCIFEROL) 1.25 MG (50000 UNIT) PO CAPS: 50000.0000 [IU] | ORAL_CAPSULE | ORAL | 2 refills | Status: DC

## 2022-09-12 NOTE — Telephone Encounter (Signed)
Rx sent 

## 2022-09-19 ENCOUNTER — Encounter (HOSPITAL_COMMUNITY): Payer: Self-pay | Admitting: Hematology

## 2022-09-19 ENCOUNTER — Other Ambulatory Visit (HOSPITAL_COMMUNITY): Payer: Self-pay

## 2022-10-05 ENCOUNTER — Other Ambulatory Visit: Payer: Self-pay | Admitting: Nurse Practitioner

## 2022-10-13 ENCOUNTER — Ambulatory Visit (INDEPENDENT_AMBULATORY_CARE_PROVIDER_SITE_OTHER): Payer: Medicare Other | Admitting: Family Medicine

## 2022-10-13 ENCOUNTER — Encounter: Payer: Self-pay | Admitting: Family Medicine

## 2022-10-13 VITALS — BP 136/76 | HR 105 | Ht 62.0 in | Wt 121.1 lb

## 2022-10-13 DIAGNOSIS — M546 Pain in thoracic spine: Secondary | ICD-10-CM | POA: Insufficient documentation

## 2022-10-13 DIAGNOSIS — I63031 Cerebral infarction due to thrombosis of right carotid artery: Secondary | ICD-10-CM

## 2022-10-13 DIAGNOSIS — E1169 Type 2 diabetes mellitus with other specified complication: Secondary | ICD-10-CM

## 2022-10-13 DIAGNOSIS — R7301 Impaired fasting glucose: Secondary | ICD-10-CM

## 2022-10-13 DIAGNOSIS — E1159 Type 2 diabetes mellitus with other circulatory complications: Secondary | ICD-10-CM

## 2022-10-13 DIAGNOSIS — E038 Other specified hypothyroidism: Secondary | ICD-10-CM

## 2022-10-13 DIAGNOSIS — E7849 Other hyperlipidemia: Secondary | ICD-10-CM

## 2022-10-13 DIAGNOSIS — E559 Vitamin D deficiency, unspecified: Secondary | ICD-10-CM

## 2022-10-13 DIAGNOSIS — I152 Hypertension secondary to endocrine disorders: Secondary | ICD-10-CM

## 2022-10-13 MED ORDER — CYCLOBENZAPRINE HCL 5 MG PO TABS: 5.0000 mg | ORAL_TABLET | Freq: Every day | ORAL | 1 refills | Status: DC

## 2022-10-13 NOTE — Assessment & Plan Note (Signed)
Complains of midline thoracic back pain Pain is rated 5 out of 10 and is described as a achy sensation Pain radiates down her lower back No sciatica symptoms reported No symptoms of back pain red flags reported No recent injury or trauma to the lower back Pain is aggravated with prolonged sitting and relieved with ambulation Flexeril 5 mg nightly order Encouraged to take Flexeril at bedtime Educated patient on the side effects of Flexeril including drowsiness and dizziness Patient verbalized understanding

## 2022-10-13 NOTE — Assessment & Plan Note (Signed)
She takes Jardiance 10 mg daily Denies polyuria, polyphagia, polydipsia She reports compliance with treatment regimen Will assess hemoglobin A1c today Lab Results  Component Value Date   HGBA1C 5.6 06/12/2022

## 2022-10-13 NOTE — Assessment & Plan Note (Addendum)
Complains of increased drooling She followed up with neurology and was referred to [facility] for swallow evaluation The patient reports that she was not contacted for the swallowing evaluation Encouraged to suck on sugar-free hard candy and chewing gum to alleviate symptoms of drooling and follow up with neurology Patient verbalized understanding

## 2022-10-13 NOTE — Patient Instructions (Addendum)
   I appreciate the opportunity to provide care to you today!    Follow up:  3 months  Labs: please stop by the lab during the week to get your blood drawn (CBC, CMP, TSH, Lipid profile, HgA1c, Vit D)  Please schedule your annual exam.   Please follow-up with [facility] neurology regarding your swallow study test that was ordered on 01/30/2022  I recommend sucking on hard candy or chewing gum to alleviate symptoms of drooling  I recommend taking Flexeril 5 mg nightly to decrease the risk of falling due to its side effects of drowsiness and dizziness.     Please continue to a heart-healthy diet and increase your physical activities. Try to exercise for 46mns at least five times a week.      It was a pleasure to see you and I look forward to continuing to work together on your health and well-being. Please do not hesitate to call the office if you need care or have questions about your care.   Have a wonderful day and week. With Gratitude, GAlvira MondayMSN, FNP-BC

## 2022-10-13 NOTE — Assessment & Plan Note (Signed)
She is following up with the lipid clinic and takes Repatha 140 mg every 14 days She is also on acetamide 10 mg daily She reports compliance with treatment regimen Will assess lipid panel today Lab Results  Component Value Date   CHOL 227 (H) 06/12/2022   HDL 57 06/12/2022   LDLCALC 150 (H) 06/12/2022   TRIG 114 06/12/2022   CHOLHDL 4.0 06/12/2022

## 2022-10-13 NOTE — Assessment & Plan Note (Signed)
Controlled She takes Norvasc 10 mg daily Denies headaches, dizziness, blurred vision, chest pain, palpitation She reports compliance with treatment regimen BP Readings from Last 3 Encounters:  10/13/22 136/76  09/10/22 116/68  06/27/22 116/72

## 2022-10-13 NOTE — Progress Notes (Signed)
Established Patient Office Visit  Subjective:  Patient ID: Elizabeth [name], female    DOB: 04/13/1941  Age: 82 y.o. MRN: [id number]  CC:  Chief Complaint  Patient presents with   Follow-up    4 month f/u, pt reports drooling for over 6 months 04/08/2022, this has worsened she was last here.     HPI Elizabeth [name] is a 82 y.o. female with past medical history of CVA, stroke, hypertension associate with diabetes, and COPD presents for f/u of  chronic medical conditions. For the details of today's visit, please refer to the assessment and plan.     Past Medical History:  Diagnosis Date   Absolute anemia 07/28/2018   Active smoker    Allergy    Anxiety    Takes Xanax for anxiety   Arthritis    Asthma    Cataract    Chronic airway obstruction, not elsewhere classified    Clotting disorder ([place])    stoke    Depression    Diarrhea    Diverticulosis of colon (without mention of hemorrhage)    Esophageal reflux    Family history of malignant neoplasm of gastrointestinal tract    GI bleed    Headache(784.0)    Irregular   Neuromuscular disorder (HCC)    numbness is left hand and left elbow   Obesity, unspecified    Other and unspecified hyperlipidemia    Personal history of colonic polyps    Stricture and stenosis of esophagus    Stroke ([place])    Thyroid disease    Type II or unspecified type diabetes mellitus without mention of complication, not stated as uncontrolled    Unspecified asthma(493.90)    Unspecified essential hypertension     Past Surgical History:  Procedure Laterality Date   APPENDECTOMY     BACK SURGERY     Spinal surgery   BRAIN SURGERY     2013 - stoke ( fall in 2010)    CARDIAC CATHETERIZATION  2006   LAD: 30%, RCA : 20%, normal EF, elevated LVEDP   CARDIAC CATHETERIZATION     CEREBRAL ANGIOGRAM  12/20/2015   COLONOSCOPY N/A 08/09/2018   Procedure: COLONOSCOPY;  Surgeon: Rogene Houston, MD;  [facility];  Service: Endoscopy;  Laterality:  N/A;  1:55   ESOPHAGEAL DILATION N/A 08/09/2018   Procedure: ESOPHAGEAL DILATION;  Surgeon: Rogene Houston, MD;  [facility];  Service: Endoscopy;  Laterality: N/A;   ESOPHAGOGASTRODUODENOSCOPY N/A 08/09/2018   Procedure: ESOPHAGOGASTRODUODENOSCOPY (EGD);  Surgeon: Rogene Houston, MD;  [facility];  Service: Endoscopy;  Laterality: N/A;   GIVENS CAPSULE STUDY N/A 08/25/2018   Procedure: GIVENS CAPSULE STUDY;  Surgeon: Rogene Houston, MD;  [facility];  Service: Endoscopy;  Laterality: N/A;   IR ANGIO INTRA EXTRACRAN SEL COM CAROTID INNOMINATE BILAT MOD SED  01/14/2018   IR ANGIO INTRA EXTRACRAN SEL COM CAROTID INNOMINATE BILAT MOD SED  05/11/2018   IR ANGIO VERTEBRAL SEL SUBCLAVIAN INNOMINATE UNI L MOD SED  01/14/2018   IR ANGIO VERTEBRAL SEL SUBCLAVIAN INNOMINATE UNI L MOD SED  05/11/2018   IR ANGIO VERTEBRAL SEL VERTEBRAL UNI R MOD SED  01/14/2018   IR ANGIO VERTEBRAL SEL VERTEBRAL UNI R MOD SED  05/11/2018   KNEE ARTHROSCOPY     left   LUMBAR DISC SURGERY     POLYPECTOMY  08/09/2018   Procedure: POLYPECTOMY;  Surgeon: Rogene Houston, MD;  [facility];  Service: Endoscopy;;  ascending colon (CS x 2, HS x5) hepatic flexure (HSx1), recto-sigmoid (HSx1)   TONSILLECTOMY     TOTAL ABDOMINAL HYSTERECTOMY     menopause/bleeding    Family History  Problem Relation Age of Onset   Diabetes Mother    Heart disease Mother    Asthma Mother    Kidney disease Mother    Drug abuse Mother    Hypertension Mother    Stroke Mother        fall -several bleed on brain 28   Early death Father        trauma   Colon cancer Other        first cousin, paternal aunt and uncle   Cancer Paternal Grandmother        gastric   Diabetes Paternal Grandmother    Mental illness Paternal Grandmother    Heart disease Maternal Grandmother    Thyroid cancer Other        aunt   Heart disease Sister    Mental illness Sister    Heart attack Sister    Heart disease  Brother    Lung disease Brother    Cancer Sister        pancreatic   Diabetes Sister    Hypertension Sister    Tuberculosis Maternal Grandfather    Heart disease Paternal Grandfather     Social History   Socioeconomic History   Marital status: Married    Spouse name: [name]   Number of children: 0   Years of education: 16   Highest education level: Bachelor's degree (e.g., BA, AB, BS)  Occupational History   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   Tobacco Use   Smoking status: Every Day    Types: E-cigarettes    Passive exposure: Current   Smokeless tobacco: Never  Vaping Use   Vaping Use: Some days   Substances: THC, CBD, Flavoring, Nicotine-salt, Synthetic cannabinoids, Mixture of cannabinoids, Homemade substance  Substance and Sexual Activity   Alcohol use: No    Alcohol/week: 0.0 standard drinks of alcohol   Drug use: No   Sexual activity: Not Currently  Other Topics Concern   Not on file  Social History Narrative   Married [name]   Limited activity due to back pain   Social Determinants of Health   Financial Resource Strain: Low Risk  (01/28/2021)   Overall Financial Resource Strain (CARDIA)    Difficulty of Paying Living Expenses: Not hard at all  Food Insecurity: No Food Insecurity (01/28/2021)   Hunger Vital Sign    Worried About Running Out of Food in the Last Year: Never true    Ran Out of Food in the Last Year: Never true  Transportation Needs: No Transportation Needs (01/28/2021)   PRAPARE - Hydrologist (Medical): No    Lack of Transportation (Non-Medical): No  Physical Activity: Insufficiently Active (01/28/2021)   Exercise Vital Sign    Days of Exercise per Week: 3 days    Minutes of Exercise per Session: 20 min  Stress: No Stress Concern Present (01/28/2021)   [place]    Feeling of Stress : Not at all  Social Connections:  Moderately Isolated (01/28/2021)   Social Connection and Isolation Panel [NHANES]    Frequency of Communication with Friends and Family: More than three times a week    Frequency of Social Gatherings  with Friends and Family: More than three times a week    Attends Religious Services: Never    Active Member of Clubs or Organizations: No    Attends Archivist Meetings: Never    Marital Status: Married  Human resources officer Violence: Not At Risk (01/28/2021)   Humiliation, Afraid, Rape, and Kick questionnaire    Fear of Current or Ex-Partner: No    Emotionally Abused: No    Physically Abused: No    Sexually Abused: No    Outpatient Medications Prior to Visit  Medication Sig Dispense Refill   albuterol (VENTOLIN HFA) 108 (90 Base) MCG/ACT inhaler Dx: J44.9 8.5 g 2   amLODipine (NORVASC) 10 MG tablet TAKE 1 TABLET BY MOUTH EVERY DAY (Patient taking differently: Take 10 mg by mouth daily.) 90 tablet 5   azelastine (ASTELIN) 0.1 % nasal spray Place 2 sprays into both nostrils 2 (two) times daily. Use in each nostril as directed 30 mL 12   clopidogrel (PLAVIX) 75 MG tablet TAKE 1 TABLET BY MOUTH DAILY 90 tablet 1   docusate sodium (COLACE) 100 MG capsule Take 1 capsule (100 mg total) by mouth daily. 30 capsule 0   empagliflozin (JARDIANCE) 10 MG TABS tablet Take 1 tablet (10 mg total) by mouth daily before breakfast. 14 tablet 0   Evolocumab (REPATHA SURECLICK) 350 MG/ML SOAJ Inject 1 mL into the skin every 14 (fourteen) days. 6 mL 3   ezetimibe (ZETIA) 10 MG tablet Take 1 tablet (10 mg total) by mouth daily. 90 tablet 3   Grape Seed OIL by Does not apply route daily at 6 (six) AM.     meclizine (ANTIVERT) 12.5 MG tablet Take 1 tablet (12.5 mg total) by mouth 3 (three) times daily as needed for dizziness. 15 tablet 0   methocarbamol (ROBAXIN) 500 MG tablet Take 1 tablet (500 mg total) by mouth every 6 (six) hours as needed for muscle spasms. 30 tablet 0   montelukast (SINGULAIR) 10 MG tablet  TAKE 1 TABLET BY MOUTH AT BEDTIME 30 tablet 3   ondansetron (ZOFRAN) 4 MG tablet TAKE 1 TABLET BY MOUTH EVERY 6 HOURS 60 tablet 1   OVER THE COUNTER MEDICATION daily at 6 (six) AM. brain essense     oxybutynin (DITROPAN XL) 15 MG 24 hr tablet 1 tablet DAILY (route: oral)     pantoprazole (PROTONIX) 40 MG tablet TAKE 1 TABLET BY MOUTH DAILY BEFORE BREAKFAST 90 tablet 0   polyethylene glycol-electrolytes (TRILYTE) 420 g solution Take 4,000 mLs by mouth as directed. 4000 mL 0   promethazine-dextromethorphan (PROMETHAZINE-DM) 6.25-15 MG/5ML syrup Take 5 mLs by mouth 4 (four) times daily as needed for cough. 118 mL 0   ursodiol (ACTIGALL) 300 MG capsule Take 1 capsule (300 mg total) by mouth 3 (three) times daily. 270 capsule 3   Vitamin D, Ergocalciferol, (DRISDOL) 1.25 MG (50000 UNIT) CAPS capsule Take 1 capsule (50,000 Units total) by mouth every 7 (seven) days. 10 capsule 2   No facility-administered medications prior to visit.    Allergies  Allergen Reactions   Banana Anaphylaxis   Codeine Nausea And Vomiting and Other (See Comments)    PROJECTILE VOMITING   Stevia [Stevioside] Swelling and Other (See Comments)    Face, tongue, and eye swelling    Actos [Pioglitazone] Swelling   Banana Flavor    Benicar [Olmesartan Medoxomil] Other (See Comments)    Feel bad   Benicar [Olmesartan]    Chantix [Varenicline] Other (See Comments)    "  Worked opposite."   Diovan [Valsartan] Other (See Comments)    Feel bad   Lipitor [Atorvastatin] Other (See Comments)    REACTION:  Joint pain   Rice Other (See Comments)    Gives patient indigestion   Spinach Other (See Comments)    Stomach problems   Statins Other (See Comments)    Feels bad, leg cramps, worries about liver effects.   Vicodin [Hydrocodone-Acetaminophen] Nausea And Vomiting   Zocor [Simvastatin] Other (See Comments)    REACTION:  "made liver function incorrectly"   Ace Inhibitors Cough   Celebrex [Celecoxib] Rash   Latex Rash and  Other (See Comments)    REACTION: red rash   Metformin And Related     GI - upset    Mushroom Extract Complex Palpitations   Penicillins Rash and Other (See Comments)    Has patient had a PCN reaction causing immediate rash, facial/tongue/throat swelling, SOB or lightheadedness with hypotension: No Has patient had a PCN reaction causing severe rash involving mucus membranes or skin necrosis: No Has patient had a PCN reaction that required hospitalization: No- MD office Has patient had a PCN reaction occurring within the last 10 years: No If all of the above answers are "NO", then may proceed with Cephalosporin use.    Sulfonamide Derivatives Rash    ROS Review of Systems  Constitutional:  Negative for chills and fever.  HENT:  Positive for drooling.   Eyes:  Negative for visual disturbance.  Respiratory:  Negative for chest tightness and shortness of breath.   Musculoskeletal:  Positive for back pain.  Neurological:  Negative for dizziness and headaches.      Objective:    Physical Exam HENT:     Head: Normocephalic.     Mouth/Throat:     Mouth: Mucous membranes are moist.  Cardiovascular:     Rate and Rhythm: Normal rate.     Heart sounds: Normal heart sounds.  Pulmonary:     Effort: Pulmonary effort is normal.     Breath sounds: Normal breath sounds.  Musculoskeletal:     Comments: No tenderness with palpation of the thoracic spine Negative CVA tenderness No overlying skin changes Range of motion is intact   Neurological:     Mental Status: She is alert.     BP 136/76 (BP Location: Left Arm)   Pulse (!) 105   Ht '5\' 2"'$  (1.575 m)   Wt 121 lb 1.9 oz (54.9 kg)   SpO2 96%   BMI 22.15 kg/m  Wt Readings from Last 3 Encounters:  10/13/22 121 lb 1.9 oz (54.9 kg)  09/10/22 121 lb 0.6 oz (54.9 kg)  06/27/22 116 lb (52.6 kg)    Lab Results  Component Value Date   TSH 1.170 06/12/2022   Lab Results  Component Value Date   WBC 7.3 06/05/2022   HGB 14.9  06/05/2022   HCT 46.4 06/05/2022   MCV 77 (L) 06/05/2022   PLT 283 06/05/2022   Lab Results  Component Value Date   NA 133 (L) 06/12/2022   K 3.3 (L) 06/12/2022   CO2 24 06/12/2022   GLUCOSE 164 (H) 06/12/2022   BUN 13 06/12/2022   CREATININE 0.73 06/12/2022   BILITOT 0.7 06/12/2022   ALKPHOS 111 06/12/2022   AST 18 06/12/2022   ALT 12 06/12/2022   PROT 6.8 06/12/2022   ALBUMIN 4.4 06/12/2022   CALCIUM 10.4 (H) 06/12/2022   ANIONGAP 9 12/10/2021   EGFR 84 06/12/2022   Lab Results  Component Value Date   CHOL 227 (H) 06/12/2022   Lab Results  Component Value Date   HDL 57 06/12/2022   Lab Results  Component Value Date   LDLCALC 150 (H) 06/12/2022   Lab Results  Component Value Date   TRIG 114 06/12/2022   Lab Results  Component Value Date   CHOLHDL 4.0 06/12/2022   Lab Results  Component Value Date   HGBA1C 5.6 06/12/2022      Assessment & Plan:  Hypertension associated with diabetes ([place]) Assessment & Plan: Controlled She takes Norvasc 10 mg daily Denies headaches, dizziness, blurred vision, chest pain, palpitation She reports compliance with treatment regimen BP Readings from Last 3 Encounters:  10/13/22 136/76  09/10/22 116/68  06/27/22 116/72     Orders: -     CMP14+EGFR -     CBC with Differential/Platelet  Type 2 diabetes mellitus with hyperlipidemia ([place]) Assessment & Plan: She takes Jardiance 10 mg daily Denies polyuria, polyphagia, polydipsia She reports compliance with treatment regimen Will assess hemoglobin A1c today Lab Results  Component Value Date   HGBA1C 5.6 06/12/2022      Hyperlipidemia associated with type 2 diabetes mellitus ([place]) Assessment & Plan: She is following up with the lipid clinic and takes Repatha 140 mg every 14 days She is also on acetamide 10 mg daily She reports compliance with treatment regimen Will assess lipid panel today Lab Results  Component Value Date   CHOL 227 (H) 06/12/2022   HDL 57  06/12/2022   [place] 150 (H) 06/12/2022   TRIG 114 06/12/2022   CHOLHDL 4.0 06/12/2022      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) Assessment & Plan: Complains of increased drooling She followed up with neurology and was referred to [facility] for swallow evaluation The patient reports that she was not contacted for the swallowing evaluation Encouraged to suck on sugar-free hard candy and chewing gum to alleviate symptoms of drooling and follow up with neurology Patient verbalized understanding   Midline thoracic back pain, unspecified chronicity Assessment & Plan: Complains of midline thoracic back pain Pain is rated 5 out of 10 and is described as a achy sensation Pain radiates down her lower back No sciatica symptoms reported No symptoms of back pain red flags reported No recent injury or trauma to the lower back Pain is aggravated with prolonged sitting and relieved with ambulation Flexeril 5 mg nightly order Encouraged to take Flexeril at bedtime Educated patient on the side effects of Flexeril including drowsiness and dizziness Patient verbalized understanding  Orders: -     Cyclobenzaprine HCl; Take 1 tablet (5 mg total) by mouth at bedtime.  Dispense: 30 tablet; Refill: 1  IFG (impaired fasting glucose) -     Hemoglobin A1c  Vitamin D deficiency -     VITAMIN D 25 Hydroxy (Vit-D Deficiency, Fractures)  Other specified hypothyroidism -     TSH + free T4  Other hyperlipidemia -     Lipid panel    Follow-up: Return in about 3 months (around 01/11/2023).   Alvira Monday, FNP

## 2022-10-14 ENCOUNTER — Other Ambulatory Visit (INDEPENDENT_AMBULATORY_CARE_PROVIDER_SITE_OTHER): Payer: Self-pay | Admitting: Gastroenterology

## 2022-10-14 ENCOUNTER — Other Ambulatory Visit: Payer: Self-pay | Admitting: Family Medicine

## 2022-10-20 ENCOUNTER — Other Ambulatory Visit: Payer: Self-pay | Admitting: Orthopedic Surgery

## 2022-10-25 LAB — CBC WITH DIFFERENTIAL/PLATELET
Basophils Absolute: 0 10*3/uL (ref 0.0–0.2)
Basos: 1 %
EOS (ABSOLUTE): 0.1 10*3/uL (ref 0.0–0.4)
Eos: 3 %
Hematocrit: 34 % (ref 34.0–46.6)
Hemoglobin: 10.9 g/dL — ABNORMAL LOW (ref 11.1–15.9)
Immature Grans (Abs): 0 10*3/uL (ref 0.0–0.1)
Immature Granulocytes: 0 %
Lymphocytes Absolute: 1.3 10*3/uL (ref 0.7–3.1)
Lymphs: 27 %
MCH: 25.3 pg — ABNORMAL LOW (ref 26.6–33.0)
MCHC: 32.1 g/dL (ref 31.5–35.7)
MCV: 79 fL (ref 79–97)
Monocytes Absolute: 0.3 10*3/uL (ref 0.1–0.9)
Monocytes: 6 %
Neutrophils Absolute: 3 10*3/uL (ref 1.4–7.0)
Neutrophils: 63 %
Platelets: 270 10*3/uL (ref 150–450)
RBC: 4.31 x10E6/uL (ref 3.77–5.28)
RDW: 13.2 % (ref 11.7–15.4)
WBC: 4.7 10*3/uL (ref 3.4–10.8)

## 2022-10-25 LAB — CMP14+EGFR
ALT: 11 IU/L (ref 0–32)
AST: 18 IU/L (ref 0–40)
Albumin/Globulin Ratio: 1.7 (ref 1.2–2.2)
Albumin: 4.1 g/dL (ref 3.8–4.8)
Alkaline Phosphatase: 121 IU/L (ref 44–121)
BUN/Creatinine Ratio: 25 (ref 12–28)
BUN: 15 mg/dL (ref 8–27)
Bilirubin Total: 0.3 mg/dL (ref 0.0–1.2)
CO2: 23 mmol/L (ref 20–29)
Calcium: 10 mg/dL (ref 8.7–10.3)
Chloride: 102 mmol/L (ref 96–106)
Creatinine, Ser: 0.61 mg/dL (ref 0.57–1.00)
Globulin, Total: 2.4 g/dL (ref 1.5–4.5)
Glucose: 102 mg/dL — ABNORMAL HIGH (ref 70–99)
Potassium: 3.9 mmol/L (ref 3.5–5.2)
Sodium: 141 mmol/L (ref 134–144)
Total Protein: 6.5 g/dL (ref 6.0–8.5)
eGFR: 91 mL/min/{1.73_m2} (ref >59)

## 2022-10-25 LAB — TSH+FREE T4
Free T4: 0.96 ng/dL (ref 0.82–1.77)
TSH: 1.49 u[IU]/mL (ref 0.450–4.500)

## 2022-10-25 LAB — VITAMIN D 25 HYDROXY (VIT D DEFICIENCY, FRACTURES): Vit D, 25-Hydroxy: 46.3 ng/mL (ref 30.0–100.0)

## 2022-10-25 LAB — LIPID PANEL
Chol/HDL Ratio: 1.9 ratio (ref 0.0–4.4)
Cholesterol, Total: 120 mg/dL (ref 100–199)
HDL: 63 mg/dL (ref >39)
LDL Chol Calc (NIH): 43 mg/dL (ref 0–99)
Triglycerides: 66 mg/dL (ref 0–149)
VLDL Cholesterol Cal: 14 mg/dL (ref 5–40)

## 2022-10-25 LAB — HEMOGLOBIN A1C
Est. average glucose Bld gHb Est-mCnc: 117 mg/dL
Hgb A1c MFr Bld: 5.7 % — ABNORMAL HIGH (ref 4.8–5.6)

## 2022-10-27 ENCOUNTER — Telehealth: Payer: Self-pay | Admitting: Orthopedic Surgery

## 2022-10-27 NOTE — Telephone Encounter (Signed)
Returned the patient's call, lvm for her to call us back.

## 2022-10-29 ENCOUNTER — Encounter: Payer: Self-pay | Admitting: Orthopedic Surgery

## 2022-10-29 ENCOUNTER — Ambulatory Visit (INDEPENDENT_AMBULATORY_CARE_PROVIDER_SITE_OTHER): Payer: Medicare Other

## 2022-10-29 ENCOUNTER — Ambulatory Visit: Payer: Medicare Other | Admitting: Orthopedic Surgery

## 2022-10-29 VITALS — BP 115/54 | Ht 62.0 in | Wt 121.0 lb

## 2022-10-29 DIAGNOSIS — M79645 Pain in left finger(s): Secondary | ICD-10-CM

## 2022-10-29 DIAGNOSIS — M25532 Pain in left wrist: Secondary | ICD-10-CM

## 2022-10-29 DIAGNOSIS — S62647A Nondisplaced fracture of proximal phalanx of left little finger, initial encounter for closed fracture: Secondary | ICD-10-CM

## 2022-10-29 MED ORDER — IBUPROFEN 800 MG PO TABS: 800.0000 mg | ORAL_TABLET | Freq: Three times a day (TID) | ORAL | 1 refills | Status: DC | PRN

## 2022-10-29 NOTE — Patient Instructions (Signed)
Change dressing once a week   Keep fingers taped together   For pain   Take tylenol 500 mg every 6 hours and Ibuprofen 800 mg every 8 hrs

## 2022-10-29 NOTE — Progress Notes (Signed)
Your hemoglobin is slightly low, I recommend increasing your intake of iron rich foods.  You are prediabetic.  I recommend avoiding simple carbohydrates, including cakes, sweet desserts, ice cream, soda (diet or regular), sweet tea, candies, chips, cookies, store-bought juices, alcohol in excess of 1-2 drinks a day, lemonade, artificial sweeteners, donuts, coffee creamers, and sugar-free products.  I recommend avoiding greasy, fatty foods with increased physical activity.  All other labs are stable

## 2022-10-29 NOTE — Progress Notes (Signed)
Chief Complaint  Patient presents with   Hand Injury    Left 5th finger injury fell 10/27/22 states broke her finger   Wrist Pain    Left/ skin tear bruising pain    New problem  82 year old female fell and injured her left small and she has a skin tear over the left wrist and some left wrist pain  X-ray shows that the small finger is fractured at its base with minimal angulation no displacement.  There appears to be a hairline fracture of the ulna as well.  She has severe osteopenia.  Clinical exam shows that she has a large skin tear on the dorsum of the forearm.  The finger itself alignment looks good.  She has tenderness at the fracture site near the base of the proximal phalanx of the left small finger.  Color and capillary refill of the fingers is normal the skin over the finger is normal.  Sensations intact.  Imaging of the wrist again shows possible hairline fracture of the ulna as well  We treated this with a nonstick dressing some Steri-Strips for the skin that we could reapproximate and gauze  Buddy taping of the small finger to the ring finger active range of motion x-ray in 3 weeks

## 2022-10-30 ENCOUNTER — Other Ambulatory Visit: Payer: Self-pay | Admitting: Family Medicine

## 2022-10-30 DIAGNOSIS — J441 Chronic obstructive pulmonary disease with (acute) exacerbation: Secondary | ICD-10-CM

## 2022-11-04 ENCOUNTER — Other Ambulatory Visit: Payer: Self-pay | Admitting: Family Medicine

## 2022-11-06 ENCOUNTER — Encounter: Payer: Self-pay | Admitting: Radiology

## 2022-11-13 NOTE — Progress Notes (Deleted)
No chief complaint on file.   HISTORY OF PRESENT ILLNESS:  11/13/22 ALL:  Elizabeth [name] is a 82 y.o. 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 She was seen in consult with Dr Krista Blue 01/2022. Referral placed to speech therapy for difficulty swallowing.   She continues Plavix and Repatha. Allergy to statins. She is followed regularly by Alvira Monday, NP, PCP. Last LDL 150, A1C 5.7 2/24.    HISTORY (copied from Dr Rhea Belton previous note)  Elizabeth [name], seen in request by   Renee Rival, FNP    I reviewed and summarized the referring note. PMHX. HTN DM COPD, quit smoke in 2021. 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 Estanislado Pandy   She is a retired Marine scientist, highly functional prior to stroke in March, ambulate without difficulty, driving, on November 08, 2021, she had a sudden onset left leg weakness, fell, was treated at [facility] initially, MRI showed acute infarction involving right internal capsule, echocardiogram showed no significant abnormality  CT angiogram of neck showed no significant large vessel stenosis, angiogram of the brain showed multivessel intracranial atherosclerotic disease,   LDL 185, A1c 6.4, hemoglobin 11.7   She was on aspirin 325 mg daily, was put on Plavix 75 mg, continued during antiplatelet treatment since then, she had a history of GI bleeding, continue followed up by a local gastroenterologist,  She was discharged to [facility] rehab later, was not evaluated by stroke service, now receiving home rehab, making progress, mild left hemiparesis, ambulate with assistant,  The most bothersome symptoms is her wet speech, mild difficulty swallowing, which is improving also, but on today's interview, she has difficulty handling her saliva, complains difficulty swallowing, food stuck in her throat, she is eating close to regular diet  at home now, only choke occasionally.   REVIEW OF SYSTEMS: Out of a complete 14 system review of symptoms, the patient complains only of the following symptoms, and all other reviewed systems are negative.   ALLERGIES: Allergies  Allergen Reactions   Banana Anaphylaxis   Codeine Nausea And Vomiting and Other (See Comments)    PROJECTILE VOMITING   Stevia [Stevioside] Swelling and Other (See Comments)    Face, tongue, and eye swelling    Actos [Pioglitazone] Swelling   Banana Flavor    Benicar [Olmesartan Medoxomil] Other (See Comments)    Feel bad   Benicar [Olmesartan]    Chantix [Varenicline] Other (See Comments)    "Worked opposite."   Diovan [Valsartan] Other (See Comments)    Feel bad   Lipitor [Atorvastatin] Other (See Comments)    REACTION:  Joint pain   Rice Other (See Comments)    Gives patient indigestion   Spinach Other (See Comments)    Stomach problems   Statins Other (See Comments)    Feels bad, leg cramps, worries about liver effects.   Vicodin [Hydrocodone-Acetaminophen] Nausea And Vomiting   Zocor [Simvastatin] Other (See Comments)    REACTION:  "made liver function incorrectly"   Ace Inhibitors Cough   Celebrex [Celecoxib] Rash   Latex Rash and Other (See Comments)    REACTION: red rash   Metformin And Related     GI - upset    Mushroom Extract Complex Palpitations   Penicillins Rash and Other (See Comments)    Has patient had a PCN reaction causing immediate rash,  facial/tongue/throat swelling, SOB or lightheadedness with hypotension: No Has patient had a PCN reaction causing severe rash involving mucus membranes or skin necrosis: No Has patient had a PCN reaction that required hospitalization: No- MD office Has patient had a PCN reaction occurring within the last 10 years: No If all of the above answers are "NO", then may proceed with Cephalosporin use.    Sulfonamide Derivatives Rash     HOME MEDICATIONS: Outpatient Medications Prior to Visit   Medication Sig Dispense Refill   albuterol (VENTOLIN HFA) 108 (90 Base) MCG/ACT inhaler INHALE TWO PUFFS BY MOUTH EVERY 6 HOURS AS NEEDED FOR wheezing OR SHORTNESS OF BREATH 8.5 g 2   amLODipine (NORVASC) 10 MG tablet TAKE 1 TABLET BY MOUTH EVERY DAY 90 tablet 5   azelastine (ASTELIN) 0.1 % nasal spray Place 2 sprays into both nostrils 2 (two) times daily. Use in each nostril as directed 30 mL 12   clopidogrel (PLAVIX) 75 MG tablet TAKE 1 TABLET BY MOUTH DAILY 90 tablet 1   cyclobenzaprine (FLEXERIL) 5 MG tablet Take 1 tablet (5 mg total) by mouth at bedtime. 30 tablet 1   docusate sodium (COLACE) 100 MG capsule Take 1 capsule (100 mg total) by mouth daily. 30 capsule 0   empagliflozin (JARDIANCE) 10 MG TABS tablet Take 1 tablet (10 mg total) by mouth daily before breakfast. 14 tablet 0   Evolocumab (REPATHA SURECLICK) [id number] MG/ML SOAJ Inject 1 mL into the skin every 14 (fourteen) days. 6 mL 3   ezetimibe (ZETIA) 10 MG tablet Take 1 tablet (10 mg total) by mouth daily. 90 tablet 3   Grape Seed OIL by Does not apply route daily at 6 (six) AM.     ibuprofen (ADVIL) 800 MG tablet Take 1 tablet (800 mg total) by mouth every 8 (eight) hours as needed. 90 tablet 1   meclizine (ANTIVERT) 12.5 MG tablet Take 1 tablet (12.5 mg total) by mouth 3 (three) times daily as needed for dizziness. 15 tablet 0   methocarbamol (ROBAXIN) 500 MG tablet Take 1 tablet (500 mg total) by mouth every 6 (six) hours as needed for muscle spasms. 30 tablet 0   montelukast (SINGULAIR) 10 MG tablet TAKE 1 TABLET BY MOUTH AT BEDTIME 30 tablet 3   ondansetron (ZOFRAN) 4 MG tablet TAKE 1 TABLET BY MOUTH EVERY 6 HOURS 60 tablet 1   OVER THE COUNTER MEDICATION daily at 6 (six) AM. brain essense     oxybutynin (DITROPAN XL) 15 MG 24 hr tablet 1 tablet DAILY (route: oral)     pantoprazole (PROTONIX) 40 MG tablet TAKE 1 TABLET BY MOUTH DAILY BEFORE BREAKFAST 90 tablet 1   polyethylene glycol-electrolytes (TRILYTE) 420 g solution Take  4,000 mLs by mouth as directed. 4000 mL 0   promethazine-dextromethorphan (PROMETHAZINE-DM) 6.25-15 MG/5ML syrup Take 5 mLs by mouth 4 (four) times daily as needed for cough. 118 mL 0   ursodiol (ACTIGALL) 300 MG capsule Take 1 capsule (300 mg total) by mouth 3 (three) times daily. 270 capsule 3   Vitamin D, Ergocalciferol, (DRISDOL) 1.25 MG (50000 UNIT) CAPS capsule Take 1 capsule (50,000 Units total) by mouth every 7 (seven) days. 10 capsule 2   No facility-administered medications prior to visit.     PAST MEDICAL HISTORY: Past Medical History:  Diagnosis Date   Absolute anemia 07/28/2018   Active smoker    Allergy    Anxiety    Takes Xanax for anxiety   Arthritis    Asthma  Cataract    Chronic airway obstruction, not elsewhere classified    Clotting disorder ([place])    stoke    Depression    Diarrhea    Diverticulosis of colon (without mention of hemorrhage)    Esophageal reflux    Family history of malignant neoplasm of gastrointestinal tract    GI bleed    Headache(784.0)    Irregular   Neuromuscular disorder (HCC)    numbness is left hand and left elbow   Obesity, unspecified    Other and unspecified hyperlipidemia    Personal history of colonic polyps    Stricture and stenosis of esophagus    Stroke ([place])    Thyroid disease    Type II or unspecified type diabetes mellitus without mention of complication, not stated as uncontrolled    Unspecified asthma(493.90)    Unspecified essential hypertension      PAST SURGICAL HISTORY: Past Surgical History:  Procedure Laterality Date   APPENDECTOMY     BACK SURGERY     Spinal surgery   BRAIN SURGERY     2013 - stoke ( fall in 2010)    CARDIAC CATHETERIZATION  2006   LAD: 30%, RCA : 20%, normal EF, elevated LVEDP   CARDIAC CATHETERIZATION     CEREBRAL ANGIOGRAM  12/20/2015   COLONOSCOPY N/A 08/09/2018   Procedure: COLONOSCOPY;  Surgeon: Rogene Houston, MD;  [facility];  Service: Endoscopy;   Laterality: N/A;  1:55   ESOPHAGEAL DILATION N/A 08/09/2018   Procedure: ESOPHAGEAL DILATION;  Surgeon: Rogene Houston, MD;  [facility];  Service: Endoscopy;  Laterality: N/A;   ESOPHAGOGASTRODUODENOSCOPY N/A 08/09/2018   Procedure: ESOPHAGOGASTRODUODENOSCOPY (EGD);  Surgeon: Rogene Houston, MD;  [facility];  Service: Endoscopy;  Laterality: N/A;   GIVENS CAPSULE STUDY N/A 08/25/2018   Procedure: GIVENS CAPSULE STUDY;  Surgeon: Rogene Houston, MD;  [facility];  Service: Endoscopy;  Laterality: N/A;   IR ANGIO INTRA EXTRACRAN SEL COM CAROTID INNOMINATE BILAT MOD SED  01/14/2018   IR ANGIO INTRA EXTRACRAN SEL COM CAROTID INNOMINATE BILAT MOD SED  05/11/2018   IR ANGIO VERTEBRAL SEL SUBCLAVIAN INNOMINATE UNI L MOD SED  01/14/2018   IR ANGIO VERTEBRAL SEL SUBCLAVIAN INNOMINATE UNI L MOD SED  05/11/2018   IR ANGIO VERTEBRAL SEL VERTEBRAL UNI R MOD SED  01/14/2018   IR ANGIO VERTEBRAL SEL VERTEBRAL UNI R MOD SED  05/11/2018   KNEE ARTHROSCOPY     left   LUMBAR DISC SURGERY     POLYPECTOMY  08/09/2018   Procedure: POLYPECTOMY;  Surgeon: Rogene Houston, MD;  [facility];  Service: Endoscopy;;  ascending colon (CS x 2, HS x5) hepatic flexure (HSx1), recto-sigmoid (HSx1)   TONSILLECTOMY     TOTAL ABDOMINAL HYSTERECTOMY     menopause/bleeding     FAMILY HISTORY: Family History  Problem Relation Age of Onset   Diabetes Mother    Heart disease Mother    Asthma Mother    Kidney disease Mother    Drug abuse Mother    Hypertension Mother    Stroke Mother        fall -several bleed on brain 91   Early death Father        trauma   Colon cancer Other        first cousin, paternal aunt and uncle   Cancer Paternal Grandmother        gastric   Diabetes Paternal  Grandmother    Mental illness Paternal Grandmother    Heart disease Maternal Grandmother    Thyroid cancer Other        aunt   Heart disease Sister    Mental illness Sister    Heart  attack Sister    Heart disease Brother    Lung disease Brother    Cancer Sister        pancreatic   Diabetes Sister    Hypertension Sister    Tuberculosis Maternal Grandfather    Heart disease Paternal Grandfather      SOCIAL HISTORY: Social History   Socioeconomic History   Marital status: Married    Spouse name: [name]   Number of children: 0   Years of education: 16   Highest education level: Bachelor's degree (e.g., BA, AB, BS)  Occupational History   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   Tobacco Use   Smoking status: Every Day    Types: E-cigarettes    Passive exposure: Current   Smokeless tobacco: Never  Vaping Use   Vaping Use: Some days   Substances: THC, CBD, Flavoring, Nicotine-salt, Synthetic cannabinoids, Mixture of cannabinoids, Homemade substance  Substance and Sexual Activity   Alcohol use: No    Alcohol/week: 0.0 standard drinks of alcohol   Drug use: No   Sexual activity: Not Currently  Other Topics Concern   Not on file  Social History Narrative   Married [name]   Limited activity due to back pain   Social Determinants of Health   Financial Resource Strain: Low Risk  (01/28/2021)   Overall Financial Resource Strain (CARDIA)    Difficulty of Paying Living Expenses: Not hard at all  Food Insecurity: No Food Insecurity (01/28/2021)   Hunger Vital Sign    Worried About Running Out of Food in the Last Year: Never true    Ran Out of Food in the Last Year: Never true  Transportation Needs: No Transportation Needs (01/28/2021)   PRAPARE - Hydrologist (Medical): No    Lack of Transportation (Non-Medical): No  Physical Activity: Insufficiently Active (01/28/2021)   Exercise Vital Sign    Days of Exercise per Week: 3 days    Minutes of Exercise per Session: 20 min  Stress: No Stress Concern Present (01/28/2021)   [place]    Feeling of  Stress : Not at all  Social Connections: Moderately Isolated (01/28/2021)   Social Connection and Isolation Panel [NHANES]    Frequency of Communication with Friends and Family: More than three times a week    Frequency of Social Gatherings with Friends and Family: More than three times a week    Attends Religious Services: Never    Marine scientist or Organizations: No    Attends Archivist Meetings: Never    Marital Status: Married  Human resources officer Violence: Not At Risk (01/28/2021)   Humiliation, Afraid, Rape, and Kick questionnaire    Fear of Current or Ex-Partner: No    Emotionally Abused: No    Physically Abused: No    Sexually Abused: No     PHYSICAL EXAM  There were no vitals filed for this visit. There is no height or weight on file to calculate BMI.  Generalized: Well developed, in no acute distress  Cardiology: normal rate and rhythm, no murmur auscultated  Respiratory: clear to auscultation bilaterally    Neurological examination  Mentation: Alert oriented to time, place, history taking. Follows all commands speech and language fluent Cranial nerve II-XII: Pupils were equal round reactive to light. Extraocular movements were full, visual field were full on confrontational test. Facial sensation and strength were normal. Uvula tongue midline. Head turning and shoulder shrug  were normal and symmetric. Motor: The motor testing reveals 5 over 5 strength of all 4 extremities. Good symmetric motor tone is noted throughout.  Sensory: Sensory testing is intact to soft touch on all 4 extremities. No evidence of extinction is noted.  Coordination: Cerebellar testing reveals good finger-nose-finger and heel-to-shin bilaterally.  Gait and station: Gait is normal. Tandem gait is normal. Romberg is negative. No drift is seen.  Reflexes: Deep tendon reflexes are symmetric and normal bilaterally.    DIAGNOSTIC DATA (LABS, IMAGING, TESTING) - I reviewed patient  records, labs, notes, testing and imaging myself where available.  Lab Results  Component Value Date   WBC 4.7 10/24/2022   HGB 10.9 (L) 10/24/2022   HCT 34.0 10/24/2022   MCV 79 10/24/2022   PLT 270 10/24/2022      Component Value Date/Time   NA 141 10/24/2022 0839   K 3.9 10/24/2022 0839   CL 102 10/24/2022 0839   CO2 23 10/24/2022 0839   GLUCOSE 102 (H) 10/24/2022 0839   GLUCOSE 103 (H) 12/10/2021 0506   BUN 15 10/24/2022 0839   CREATININE 0.61 10/24/2022 0839   CREATININE 0.71 08/09/2013 0828   CALCIUM 10.0 10/24/2022 0839   PROT 6.5 10/24/2022 0839   ALBUMIN 4.1 10/24/2022 0839   AST 18 10/24/2022 0839   ALT 11 10/24/2022 0839   ALKPHOS 121 10/24/2022 0839   BILITOT 0.3 10/24/2022 0839   GFRNONAA >60 12/10/2021 0506   GFRNONAA 87 08/09/2013 0828   GFRAA 84 10/03/2020 0838   GFRAA >89 08/09/2013 0828   Lab Results  Component Value Date   CHOL 120 10/24/2022   HDL 63 10/24/2022   LDLCALC 43 10/24/2022   TRIG 66 10/24/2022   CHOLHDL 1.9 10/24/2022   Lab Results  Component Value Date   HGBA1C 5.7 (H) 10/24/2022   Lab Results  Component Value Date   VITAMINB12 642 11/14/2020   Lab Results  Component Value Date   TSH 1.490 10/24/2022        No data to display               No data to display           ASSESSMENT AND PLAN  82 y.o. year old female  has a past medical history of Absolute anemia (07/28/2018), Active smoker, Allergy, Anxiety, Arthritis, Asthma, Cataract, Chronic airway obstruction, not elsewhere classified, Clotting disorder ([place]), Depression, Diarrhea, Diverticulosis of colon (without mention of hemorrhage), Esophageal reflux, Family history of malignant neoplasm of gastrointestinal tract, GI bleed, Headache(784.0), Neuromuscular disorder ([place]), Obesity, unspecified, Other and unspecified hyperlipidemia, Personal history of colonic polyps, Stricture and stenosis of esophagus, Stroke ([employer]), Thyroid disease, Type II or unspecified type  diabetes mellitus without mention of complication, not stated as uncontrolled, Unspecified asthma(493.90), and Unspecified essential hypertension. here with    No diagnosis found.  Elizabeth Evens Laible ***.  Healthy lifestyle habits encouraged. *** will follow up with PCP as directed. *** will return to see me in ***, sooner if needed. *** verbalizes understanding and agreement with this plan.   No orders of the defined types were placed in this encounter.    No orders of the defined types were placed  in this encounter.    Debbora Presto, MSN, FNP-C 11/13/2022, 9:36 AM  [facility] [address], [place], [place] [address] [phone]

## 2022-11-17 ENCOUNTER — Ambulatory Visit: Payer: Medicare Other | Admitting: Family Medicine

## 2022-11-17 ENCOUNTER — Telehealth: Payer: Self-pay | Admitting: Neurology

## 2022-11-17 DIAGNOSIS — R1310 Dysphagia, unspecified: Secondary | ICD-10-CM

## 2022-11-17 DIAGNOSIS — I6330 Cerebral infarction due to thrombosis of unspecified cerebral artery: Secondary | ICD-10-CM

## 2022-11-17 DIAGNOSIS — I69354 Hemiplegia and hemiparesis following cerebral infarction affecting left non-dominant side: Secondary | ICD-10-CM

## 2022-11-17 NOTE — Telephone Encounter (Signed)
Pt cancelled appt due to having no power.

## 2022-11-24 ENCOUNTER — Encounter: Payer: Self-pay | Admitting: Orthopedic Surgery

## 2022-11-24 ENCOUNTER — Other Ambulatory Visit (INDEPENDENT_AMBULATORY_CARE_PROVIDER_SITE_OTHER): Payer: Medicare Other

## 2022-11-24 ENCOUNTER — Ambulatory Visit: Payer: Medicare Other | Admitting: Orthopedic Surgery

## 2022-11-24 DIAGNOSIS — S62647D Nondisplaced fracture of proximal phalanx of left little finger, subsequent encounter for fracture with routine healing: Secondary | ICD-10-CM | POA: Diagnosis not present

## 2022-11-24 NOTE — Progress Notes (Signed)
FRACTURE CARE FOLLOW UP   FOV: 10/29/22  DOI: 10/27/22  GLOBAL PERIOD: 90 days   Encounter Diagnosis  Name Primary?   Closed nondisplaced fracture of proximal phalanx of left little finger with routine healing, subsequent encounter Yes    TREATMENT: AROM and taping   HPI   82 year old female fell and injured her left small and she has a skin tear over the left wrist and some left wrist pain   X-ray shows that the small finger is fractured at its base with minimal angulation no displacement.  There appears to be a hairline fracture of the ulna as well.  She has severe osteopenia.  This is a follow up fracture care visit   The patient range of motion has returned to normal.  Her x-ray shows fracture healing.  The wound is clean she can continue with Neosporin and a dry dressing

## 2022-12-02 ENCOUNTER — Other Ambulatory Visit (INDEPENDENT_AMBULATORY_CARE_PROVIDER_SITE_OTHER): Payer: Self-pay | Admitting: Gastroenterology

## 2022-12-02 DIAGNOSIS — K743 Primary biliary cirrhosis: Secondary | ICD-10-CM

## 2022-12-02 NOTE — Telephone Encounter (Signed)
Last seen 05/27/22 by Vikki Ports

## 2022-12-17 ENCOUNTER — Other Ambulatory Visit: Payer: Self-pay | Admitting: Orthopedic Surgery

## 2023-01-05 ENCOUNTER — Telehealth: Payer: Self-pay | Admitting: Family Medicine

## 2023-01-05 NOTE — Telephone Encounter (Signed)
Pt called in having issues with sinuses. Wants a zpack. Wants a call back

## 2023-01-06 NOTE — Telephone Encounter (Signed)
Please schedule an appointment.

## 2023-01-06 NOTE — Telephone Encounter (Signed)
Pt declined in office visit

## 2023-01-12 ENCOUNTER — Ambulatory Visit: Payer: Medicare Other | Admitting: Family Medicine

## 2023-01-24 ENCOUNTER — Other Ambulatory Visit: Payer: Self-pay | Admitting: Family Medicine

## 2023-01-24 DIAGNOSIS — J441 Chronic obstructive pulmonary disease with (acute) exacerbation: Secondary | ICD-10-CM

## 2023-01-27 ENCOUNTER — Ambulatory Visit (INDEPENDENT_AMBULATORY_CARE_PROVIDER_SITE_OTHER): Payer: Medicare Other | Admitting: Family Medicine

## 2023-01-27 ENCOUNTER — Encounter: Payer: Self-pay | Admitting: Family Medicine

## 2023-01-27 VITALS — BP 126/71 | HR 68 | Ht 62.0 in | Wt 132.0 lb

## 2023-01-27 DIAGNOSIS — K219 Gastro-esophageal reflux disease without esophagitis: Secondary | ICD-10-CM | POA: Diagnosis not present

## 2023-01-27 DIAGNOSIS — E1159 Type 2 diabetes mellitus with other circulatory complications: Secondary | ICD-10-CM | POA: Diagnosis not present

## 2023-01-27 DIAGNOSIS — E1169 Type 2 diabetes mellitus with other specified complication: Secondary | ICD-10-CM

## 2023-01-27 DIAGNOSIS — Z7984 Long term (current) use of oral hypoglycemic drugs: Secondary | ICD-10-CM

## 2023-01-27 DIAGNOSIS — E785 Hyperlipidemia, unspecified: Secondary | ICD-10-CM

## 2023-01-27 DIAGNOSIS — I152 Hypertension secondary to endocrine disorders: Secondary | ICD-10-CM | POA: Diagnosis not present

## 2023-01-27 MED ORDER — HYDROCHLOROTHIAZIDE 12.5 MG PO TABS
12.5000 mg | ORAL_TABLET | Freq: Every day | ORAL | 3 refills | Status: DC
Start: 2023-01-27 — End: 2023-11-16

## 2023-01-27 NOTE — Assessment & Plan Note (Signed)
She takes Protonix 40 mg daily Reports compliance with treatment regimen No complaints or concerns voiced

## 2023-01-27 NOTE — Assessment & Plan Note (Signed)
She is following up with the lipid clinic and takes Repatha 140 mg every 14 days She is also on Ezetimibe 10 mg daily She reports compliance with treatment regimen Will assess lipid panel today Lab Results  Component Value Date   CHOL 120 10/24/2022   HDL 63 10/24/2022   LDLCALC 43 10/24/2022   TRIG 66 10/24/2022   CHOLHDL 1.9 10/24/2022

## 2023-01-27 NOTE — Patient Instructions (Addendum)
  I appreciate the opportunity to provide care to you today!    Follow up:  3 months  Labs: please stop by the lab today to get your blood drawn (BMP)    Please go to your local pharmacy for tdap and shingrix vaccines    Please continue to a heart-healthy diet and increase your physical activities. Try to exercise for at least five days a week.      It was a pleasure to see you and I look forward to continuing to work together on your health and well-being. Please do not hesitate to call the office if you need care or have questions about your care.   Have a wonderful day and week. With Gratitude, Gilmore Laroche MSN, FNP-BC

## 2023-01-27 NOTE — Progress Notes (Signed)
Established Patient Office Visit  Subjective:  Patient ID: Elizabeth [name], female    DOB: 06/11/1941  Age: 83 y.o. MRN: [id number]  CC:  Chief Complaint  Patient presents with   Chronic Care Management    3 month f/u, pt reports dizziness at times when she wakes up in the am. Needs to discuss hztz needs a refill.     HPI Elizabeth [name] is a 82 y.o. female with past medical history of hypertension, hyperlipidemia, and GERD presents for f/u of  chronic medical conditions. For the details of today's visit, please refer to the assessment and plan.     Past Medical History:  Diagnosis Date   Absolute anemia 07/28/2018   Active smoker    Allergy    Anxiety    Takes Xanax for anxiety   Arthritis    Asthma    Cataract    Chronic airway obstruction, not elsewhere classified    Clotting disorder (HCC)    stoke    Depression    Diarrhea    Diverticulosis of colon (without mention of hemorrhage)    Esophageal reflux    Family history of malignant neoplasm of gastrointestinal tract    GI bleed    Headache(784.0)    Irregular   Neuromuscular disorder (HCC)    numbness is left hand and left elbow   Obesity, unspecified    Other and unspecified hyperlipidemia    Personal history of colonic polyps    Stricture and stenosis of esophagus    Stroke (HCC)    Thyroid disease    Type II or unspecified type diabetes mellitus without mention of complication, not stated as uncontrolled    Unspecified asthma(493.90)    Unspecified essential hypertension     Past Surgical History:  Procedure Laterality Date   APPENDECTOMY     BACK SURGERY     Spinal surgery   BRAIN SURGERY     2013 - stoke ( fall in 2010)    CARDIAC CATHETERIZATION  2006   LAD: 30%, RCA : 20%, normal EF, elevated LVEDP   CARDIAC CATHETERIZATION     CEREBRAL ANGIOGRAM  12/20/2015   COLONOSCOPY N/A 08/09/2018   Procedure: COLONOSCOPY;  Surgeon: Malissa Hippo, MD;  [facility];  Service: Endoscopy;   Laterality: N/A;  1:55   ESOPHAGEAL DILATION N/A 08/09/2018   Procedure: ESOPHAGEAL DILATION;  Surgeon: Malissa Hippo, MD;  [facility]: [facility];  Service: Endoscopy;  Laterality: N/A;   ESOPHAGOGASTRODUODENOSCOPY N/A 08/09/2018   Procedure: ESOPHAGOGASTRODUODENOSCOPY (EGD);  Surgeon: Malissa Hippo, MD;  [facility]: [facility];  Service: Endoscopy;  Laterality: N/A;   GIVENS CAPSULE STUDY N/A 08/25/2018   Procedure: GIVENS CAPSULE STUDY;  Surgeon: Malissa Hippo, MD;  [facility]: [facility];  Service: Endoscopy;  Laterality: N/A;   IR ANGIO INTRA EXTRACRAN SEL COM CAROTID INNOMINATE BILAT MOD SED  01/14/2018   IR ANGIO INTRA EXTRACRAN SEL COM CAROTID INNOMINATE BILAT MOD SED  05/11/2018   IR ANGIO VERTEBRAL SEL SUBCLAVIAN INNOMINATE UNI L MOD SED  01/14/2018   IR ANGIO VERTEBRAL SEL SUBCLAVIAN INNOMINATE UNI L MOD SED  05/11/2018   IR ANGIO VERTEBRAL SEL VERTEBRAL UNI R MOD SED  01/14/2018   IR ANGIO VERTEBRAL SEL VERTEBRAL UNI R MOD SED  05/11/2018   KNEE ARTHROSCOPY     left   LUMBAR DISC SURGERY     POLYPECTOMY  08/09/2018   Procedure: POLYPECTOMY;  Surgeon: Malissa Hippo, MD;  Location: [facility];  Service: Endoscopy;;  ascending colon (CS x 2, HS x5) hepatic flexure (HSx1), recto-sigmoid (HSx1)   TONSILLECTOMY     TOTAL ABDOMINAL HYSTERECTOMY     menopause/bleeding    Family History  Problem Relation Age of Onset   Diabetes Mother    Heart disease Mother    Asthma Mother    Kidney disease Mother    Drug abuse Mother    Hypertension Mother    Stroke Mother        fall -several bleed on brain 67   Early death Father        trauma   Colon cancer Other        first cousin, paternal aunt and uncle   Cancer Paternal Grandmother        gastric   Diabetes Paternal Grandmother    Mental illness Paternal Grandmother    Heart disease Maternal Grandmother    Thyroid cancer Other        aunt   Heart disease Sister    Mental illness Sister    Heart attack Sister    Heart  disease Brother    Lung disease Brother    Cancer Sister        pancreatic   Diabetes Sister    Hypertension Sister    Tuberculosis Maternal Grandfather    Heart disease Paternal Grandfather     Social History   Socioeconomic History   Marital status: Married    Spouse name: [name]   Number of children: 0   Years of education: 16   Highest education level: Bachelor's degree (e.g., BA, AB, BS)  Occupational History   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   Tobacco Use   Smoking status: Every Day    Types: E-cigarettes    Passive exposure: Current   Smokeless tobacco: Never  Vaping Use   Vaping Use: Some days   Substances: THC, CBD, Flavoring, Nicotine-salt, Synthetic cannabinoids, Mixture of cannabinoids, Homemade substance  Substance and Sexual Activity   Alcohol use: No    Alcohol/week: 0.0 standard drinks of alcohol   Drug use: No   Sexual activity: Not Currently  Other Topics Concern   Not on file  Social History Narrative   Married [name]   Limited activity due to back pain   Social Determinants of Health   Financial Resource Strain: Low Risk  (01/28/2021)   Overall Financial Resource Strain (CARDIA)    Difficulty of Paying Living Expenses: Not hard at all  Food Insecurity: No Food Insecurity (01/28/2021)   Hunger Vital Sign    Worried About Running Out of Food in the Last Year: Never true    Ran Out of Food in the Last Year: Never true  Transportation Needs: No Transportation Needs (01/28/2021)   PRAPARE - Administrator, Civil Service (Medical): No    Lack of Transportation (Non-Medical): No  Physical Activity: Insufficiently Active (01/28/2021)   Exercise Vital Sign    Days of Exercise per Week: 3 days    Minutes of Exercise per Session: 20 min  Stress: No Stress Concern Present (01/28/2021)   [employer] of Occupational Health - Occupational Stress Questionnaire    Feeling of Stress : Not at all  Social Connections:  Moderately Isolated (01/28/2021)   Social Connection and Isolation Panel [NHANES]    Frequency of Communication with Friends and Family: More than three times a week    Frequency of  Social Gatherings with Friends and Family: More than three times a week    Attends Religious Services: Never    Database administrator or Organizations: No    Attends Banker Meetings: Never    Marital Status: Married  Catering manager Violence: Not At Risk (01/28/2021)   Humiliation, Afraid, Rape, and Kick questionnaire    Fear of Current or Ex-Partner: No    Emotionally Abused: No    Physically Abused: No    Sexually Abused: No    Outpatient Medications Prior to Visit  Medication Sig Dispense Refill   albuterol (VENTOLIN HFA) 108 (90 Base) MCG/ACT inhaler INHALE TWO PUFFS BY MOUTH EVERY 6 HOURS AS NEEDED FOR wheezing OR SHORTNESS OF BREATH 8.5 g 2   amLODipine (NORVASC) 10 MG tablet TAKE 1 TABLET BY MOUTH EVERY DAY 90 tablet 5   azelastine (ASTELIN) 0.1 % nasal spray Place 2 sprays into both nostrils 2 (two) times daily. Use in each nostril as directed 30 mL 12   chlorpheniramine (ALLERGY RELIEF) 4 MG tablet Take 4 mg by mouth 2 (two) times daily as needed for allergies.     clopidogrel (PLAVIX) 75 MG tablet TAKE 1 TABLET BY MOUTH DAILY 90 tablet 1   docusate sodium (COLACE) 100 MG capsule Take 1 capsule (100 mg total) by mouth daily. 30 capsule 0   Evolocumab (REPATHA SURECLICK) 140 MG/ML SOAJ Inject 1 mL into the skin every 14 (fourteen) days. 6 mL 3   ezetimibe (ZETIA) 10 MG tablet Take 1 tablet (10 mg total) by mouth daily. 90 tablet 3   Grape Seed OIL by Does not apply route daily at 6 (six) AM.     ibuprofen (ADVIL) 800 MG tablet Take 1 tablet (800 mg total) by mouth every 8 (eight) hours as needed. 90 tablet 1   meclizine (ANTIVERT) 12.5 MG tablet Take 1 tablet (12.5 mg total) by mouth 3 (three) times daily as needed for dizziness. 15 tablet 0   montelukast (SINGULAIR) 10 MG tablet TAKE 1  TABLET BY MOUTH AT BEDTIME 30 tablet 3   ondansetron (ZOFRAN) 4 MG tablet TAKE 1 TABLET BY MOUTH EVERY 6 HOURS 60 tablet 1   OVER THE COUNTER MEDICATION daily at 6 (six) AM. brain essense     pantoprazole (PROTONIX) 40 MG tablet TAKE 1 TABLET BY MOUTH DAILY BEFORE BREAKFAST 90 tablet 1   polyethylene glycol-electrolytes (TRILYTE) 420 g solution Take 4,000 mLs by mouth as directed. 4000 mL 0   Specialty Vitamins Products (COLLAGEN ULTRA PO) Take 60 mg by mouth 1 day or 1 dose. Collagen peptides     UNABLE TO FIND 2 (two) times daily. Med Name: cinnamon, chromium & biotin     UNABLE TO FIND 1 day or 1 dose. Med Name: curamin 1/2 tablet for pain     ursodiol (ACTIGALL) 300 MG capsule TAKE ONE CAPSULE BY MOUTH THREE TIMES DAILY 270 capsule 3   Vitamin D, Ergocalciferol, (DRISDOL) 1.25 MG (50000 UNIT) CAPS capsule Take 1 capsule (50,000 Units total) by mouth every 7 (seven) days. 10 capsule 2   promethazine-dextromethorphan (PROMETHAZINE-DM) 6.25-15 MG/5ML syrup Take 5 mLs by mouth 4 (four) times daily as needed for cough. 118 mL 0   cyclobenzaprine (FLEXERIL) 5 MG tablet Take 1 tablet (5 mg total) by mouth at bedtime. (Patient not taking: Reported on 01/27/2023) 30 tablet 1   empagliflozin (JARDIANCE) 10 MG TABS tablet Take 1 tablet (10 mg total) by mouth daily before breakfast. (Patient not taking:  Reported on 01/27/2023) 14 tablet 0   methocarbamol (ROBAXIN) 500 MG tablet Take 1 tablet (500 mg total) by mouth every 6 (six) hours as needed for muscle spasms. (Patient not taking: Reported on 01/27/2023) 30 tablet 0   oxybutynin (DITROPAN XL) 15 MG 24 hr tablet 1 tablet DAILY (route: oral) (Patient not taking: Reported on 01/27/2023)     No facility-administered medications prior to visit.    Allergies  Allergen Reactions   Banana Anaphylaxis   Codeine Nausea And Vomiting and Other (See Comments)    PROJECTILE VOMITING   Stevia [Stevioside] Swelling and Other (See Comments)    Face, tongue, and eye  swelling    Actos [Pioglitazone] Swelling   Banana Flavor    Benicar [Olmesartan Medoxomil] Other (See Comments)    Feel bad   Benicar [Olmesartan]    Chantix [Varenicline] Other (See Comments)    "Worked opposite."   Diovan [Valsartan] Other (See Comments)    Feel bad   Lipitor [Atorvastatin] Other (See Comments)    REACTION:  Joint pain   Rice Other (See Comments)    Gives patient indigestion   Spinach Other (See Comments)    Stomach problems   Statins Other (See Comments)    Feels bad, leg cramps, worries about liver effects.   Vicodin [Hydrocodone-Acetaminophen] Nausea And Vomiting   Zocor [Simvastatin] Other (See Comments)    REACTION:  "made liver function incorrectly"   Ace Inhibitors Cough   Celebrex [Celecoxib] Rash   Latex Rash and Other (See Comments)    REACTION: red rash   Metformin And Related     GI - upset    Mushroom Extract Complex Palpitations   Penicillins Rash and Other (See Comments)    Has patient had a PCN reaction causing immediate rash, facial/tongue/throat swelling, SOB or lightheadedness with hypotension: No Has patient had a PCN reaction causing severe rash involving mucus membranes or skin necrosis: No Has patient had a PCN reaction that required hospitalization: No- MD office Has patient had a PCN reaction occurring within the last 10 years: No If all of the above answers are "NO", then may proceed with Cephalosporin use.    Sulfonamide Derivatives Rash    ROS Review of Systems  Constitutional:  Negative for chills and fever.  Eyes:  Negative for visual disturbance.  Respiratory:  Negative for chest tightness and shortness of breath.   Neurological:  Negative for dizziness and headaches.      Objective:    Physical Exam HENT:     Head: Normocephalic.     Mouth/Throat:     Mouth: Mucous membranes are moist.  Cardiovascular:     Rate and Rhythm: Normal rate.     Heart sounds: Normal heart sounds.  Pulmonary:     Effort: Pulmonary  effort is normal.     Breath sounds: Normal breath sounds.  Neurological:     Mental Status: She is alert.     BP 126/71   Pulse 68   Ht 5\' 2"  (1.575 m)   Wt 132 lb 0.6 oz (59.9 kg)   SpO2 93%   BMI 24.15 kg/m  Wt Readings from Last 3 Encounters:  01/27/23 132 lb 0.6 oz (59.9 kg)  10/29/22 121 lb (54.9 kg)  10/13/22 121 lb 1.9 oz (54.9 kg)    Lab Results  Component Value Date   TSH 1.490 10/24/2022   Lab Results  Component Value Date   WBC 4.7 10/24/2022   HGB 10.9 (L) 10/24/2022  HCT 34.0 10/24/2022   MCV 79 10/24/2022   PLT 270 10/24/2022   Lab Results  Component Value Date   NA 141 10/24/2022   K 3.9 10/24/2022   CO2 23 10/24/2022   GLUCOSE 102 (H) 10/24/2022   BUN 15 10/24/2022   CREATININE 0.61 10/24/2022   BILITOT 0.3 10/24/2022   ALKPHOS 121 10/24/2022   AST 18 10/24/2022   ALT 11 10/24/2022   PROT 6.5 10/24/2022   ALBUMIN 4.1 10/24/2022   CALCIUM 10.0 10/24/2022   ANIONGAP 9 12/10/2021   EGFR 91 10/24/2022   Lab Results  Component Value Date   CHOL 120 10/24/2022   Lab Results  Component Value Date   HDL 63 10/24/2022   Lab Results  Component Value Date   LDLCALC 43 10/24/2022   Lab Results  Component Value Date   TRIG 66 10/24/2022   Lab Results  Component Value Date   CHOLHDL 1.9 10/24/2022   Lab Results  Component Value Date   HGBA1C 5.7 (H) 10/24/2022      Assessment & Plan:  Hypertension associated with diabetes (HCC) Assessment & Plan: She reports taking hydrochlorothiazide 12.5 and Norvasc's 10 mg daily Patient is asymptomatic Reports taking hydrochlorothiazide since 2021 with no adverse effects Pending BMP Refill sent to the pharmacy BP Readings from Last 3 Encounters:  01/27/23 126/71  10/29/22 (!) 115/54  10/13/22 136/76     Orders: -     hydroCHLOROthiazide; Take 1 tablet (12.5 mg total) by mouth daily.  Dispense: 90 tablet; Refill: 3 -     BMP8+EGFR  Hyperlipidemia associated with type 2 diabetes  mellitus (HCC) Assessment & Plan: She is following up with the lipid clinic and takes Repatha 140 mg every 14 days She is also on Ezetimibe 10 mg daily She reports compliance with treatment regimen Will assess lipid panel today Lab Results  Component Value Date   CHOL 120 10/24/2022   HDL 63 10/24/2022   LDLCALC 43 10/24/2022   TRIG 66 10/24/2022   CHOLHDL 1.9 10/24/2022      Gastroesophageal reflux disease without esophagitis Assessment & Plan: She takes Protonix 40 mg daily Reports compliance with treatment regimen No complaints or concerns voiced   Type 2 diabetes mellitus with hyperlipidemia (HCC) -     [facility] Diabetes Foot Exam    Follow-up: Return in about 3 months (around 04/29/2023).   Gilmore Laroche, FNP

## 2023-01-27 NOTE — Assessment & Plan Note (Signed)
She reports taking hydrochlorothiazide 12.5 and Norvasc's 10 mg daily Patient is asymptomatic Reports taking hydrochlorothiazide since 2021 with no adverse effects Pending BMP Refill sent to the pharmacy BP Readings from Last 3 Encounters:  01/27/23 126/71  10/29/22 (!) 115/54  10/13/22 136/76

## 2023-02-05 ENCOUNTER — Telehealth: Payer: Self-pay | Admitting: Family Medicine

## 2023-02-05 NOTE — Telephone Encounter (Signed)
Pt reports severe back at night, asking for a refill on Robaxin, please advice if ok to refill?

## 2023-02-05 NOTE — Telephone Encounter (Signed)
Pt called in complaining of back pains ,wants to see if pcp can send in meds to help. Wants a call back.

## 2023-02-06 ENCOUNTER — Other Ambulatory Visit: Payer: Self-pay | Admitting: Family Medicine

## 2023-02-06 DIAGNOSIS — M546 Pain in thoracic spine: Secondary | ICD-10-CM

## 2023-02-06 MED ORDER — CYCLOBENZAPRINE HCL 5 MG PO TABS: 5.0000 mg | ORAL_TABLET | Freq: Every day | ORAL | 1 refills | Status: DC

## 2023-02-06 NOTE — Telephone Encounter (Signed)
Flexeril 5 mg to take at bedtime is sent to her pharmacy.  I recommend taking Tylenol 650 mg every 8 hours as needed for pain relief.  Also alternating with heat and cold therapy at the affected site

## 2023-02-09 NOTE — Telephone Encounter (Signed)
Left detailed vm for pt.

## 2023-02-10 LAB — BMP8+EGFR
BUN/Creatinine Ratio: 24 (ref 12–28)
BUN: 15 mg/dL (ref 8–27)
CO2: 25 mmol/L (ref 20–29)
Calcium: 9.8 mg/dL (ref 8.7–10.3)
Chloride: 102 mmol/L (ref 96–106)
Creatinine, Ser: 0.62 mg/dL (ref 0.57–1.00)
Glucose: 113 mg/dL — ABNORMAL HIGH (ref 70–99)
Potassium: 3.7 mmol/L (ref 3.5–5.2)
Sodium: 139 mmol/L (ref 134–144)
eGFR: 90 mL/min/{1.73_m2} (ref >59)

## 2023-02-10 NOTE — Telephone Encounter (Signed)
Patient is returning nurse call.

## 2023-02-10 NOTE — Telephone Encounter (Signed)
Pt has been informed of Glorias note

## 2023-02-10 NOTE — Progress Notes (Signed)
Please inform the patient her potassium and kidney levels are stable

## 2023-02-11 ENCOUNTER — Other Ambulatory Visit: Payer: Self-pay | Admitting: Orthopedic Surgery

## 2023-02-26 ENCOUNTER — Other Ambulatory Visit: Payer: Self-pay | Admitting: Family Medicine

## 2023-02-26 DIAGNOSIS — E559 Vitamin D deficiency, unspecified: Secondary | ICD-10-CM

## 2023-03-11 ENCOUNTER — Other Ambulatory Visit: Payer: Self-pay | Admitting: Cardiology

## 2023-03-11 DIAGNOSIS — E1169 Type 2 diabetes mellitus with other specified complication: Secondary | ICD-10-CM

## 2023-03-11 DIAGNOSIS — I639 Cerebral infarction, unspecified: Secondary | ICD-10-CM

## 2023-03-19 ENCOUNTER — Telehealth: Payer: Self-pay | Admitting: Family Medicine

## 2023-03-19 NOTE — Telephone Encounter (Signed)
Patient is calling back  said she sent Gilmore Laroche a message and  needs the results on patient physical where the nurse came out to her home and did her a physical and needs to know the results of what the nurse did to her yesterday.  Call back # [phone].

## 2023-03-19 NOTE — Telephone Encounter (Signed)
Patient called left voicemail returning nurse call. Call back # [phone].

## 2023-03-20 NOTE — Telephone Encounter (Signed)
Spoke with patient. She states that the physical was done by [employer] on 6/29 and they said they would fax the note to [place]. Patient wanted to know what the note said.

## 2023-03-27 ENCOUNTER — Other Ambulatory Visit: Payer: Self-pay | Admitting: Nurse Practitioner

## 2023-03-27 DIAGNOSIS — R42 Dizziness and giddiness: Secondary | ICD-10-CM

## 2023-03-31 ENCOUNTER — Other Ambulatory Visit (INDEPENDENT_AMBULATORY_CARE_PROVIDER_SITE_OTHER): Payer: Self-pay | Admitting: Gastroenterology

## 2023-03-31 ENCOUNTER — Other Ambulatory Visit: Payer: Self-pay | Admitting: Family Medicine

## 2023-03-31 DIAGNOSIS — M546 Pain in thoracic spine: Secondary | ICD-10-CM

## 2023-04-09 ENCOUNTER — Encounter: Payer: Self-pay | Admitting: Family Medicine

## 2023-04-09 ENCOUNTER — Ambulatory Visit: Payer: Medicare Other | Admitting: Family Medicine

## 2023-04-09 VITALS — BP 165/86 | HR 100 | Ht 62.0 in | Wt 135.0 lb

## 2023-04-09 DIAGNOSIS — I69354 Hemiplegia and hemiparesis following cerebral infarction affecting left non-dominant side: Secondary | ICD-10-CM

## 2023-04-09 DIAGNOSIS — I6330 Cerebral infarction due to thrombosis of unspecified cerebral artery: Secondary | ICD-10-CM

## 2023-04-09 DIAGNOSIS — R471 Dysarthria and anarthria: Secondary | ICD-10-CM | POA: Diagnosis not present

## 2023-04-09 NOTE — Patient Instructions (Signed)
Below is our plan:  I would encourage you to consider outpatient rehab per Dr Jodean Lima recommendations at [facility].   [facility] [address] [place], [place] [address] [phone]  Continue to keep a close eye on your blood pressure. Continue Repatha and Plavix. Continue follow up with cardiology.   Goals:  1) Maintain strict control of hypertension with blood pressure goal below 130/90 2) Maintain good control of diabetes with hemoglobin A1c goal below 7%  3) Maintain good control of lipids with LDL cholesterol goal below 70 mg/dL.  4) Eat a healthy diet with plenty of whole grains, cereals, fruits and vegetables, exercise regularly and maintain ideal body weight   Resources: [email or web address].biz/  Please make sure you are staying well hydrated. I recommend 50-60 ounces daily. Well balanced diet and regular exercise encouraged. Consistent sleep schedule with 6-8 hours recommended.   Please continue follow up with care team as directed.   Follow up with me as needed   You may receive a survey regarding today's visit. I encourage you to leave honest feed back as I do use this information to improve patient care. Thank you for seeing me today!

## 2023-04-09 NOTE — Progress Notes (Signed)
Chief Complaint  Patient presents with   Follow-up    Pt in room 1,daughter in room. here for stroke follow up. Pt states fell and hurt her left shoulder, can't remember when this happened.    HISTORY OF PRESENT ILLNESS:  04/09/23 ALL:  Elizabeth [name] is a 82 y.o. 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 She was last seen by Dr Terrace Arabia 01/2022. Six month follow up advised. She reports having difficulty getting back to [place] for appt. She is doing fairly well. Her friend presents with her and states that she seems to be much better than last year. Still has some residual left sided weakness. Difficulty talking at times. No trouble swallowing or eating. She has fallen several times over the past year. She uses her walker. She was last seen by Dr Wynn Banker and advised to consider outpatient PT/OT/ST but she and her husband declined. She is followed by orthopedics following left shoulder fracture and left knee pain. She continues Repatha and Plavix. No excessive bruising. BP has been elevated. She does not check at home. She reports taking amlodipine and hydrochlorothiazide as prescribed. She is followed by PCP regularly.    HISTORY (copied from Yan's previous note)   Alfredo Batty, seen in request by   Donell Beers, FNP    I reviewed and summarized the referring note. PMHX. HTN DM COPD, quit smoke in 2021. 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 Corliss Skains   She is a retired Engineer, civil (consulting), highly functional prior to stroke in March, ambulate without difficulty, driving, on November 08, 2021, she had a sudden onset left leg weakness, fell, was treated at [facility] initially, MRI showed acute infarction involving right internal capsule, echocardiogram showed no significant abnormality  CT angiogram of neck showed no significant large vessel stenosis, angiogram of the brain  showed multivessel intracranial atherosclerotic disease,   LDL 185, A1c 6.4, hemoglobin 11.7   She was on aspirin 325 mg daily, was put on Plavix 75 mg, continued during antiplatelet treatment since then, she had a history of GI bleeding, continue followed up by a local gastroenterologist,  She was discharged to [facility] rehab later, was not evaluated by stroke service, now receiving home rehab, making progress, mild left hemiparesis, ambulate with assistant,  The most bothersome symptoms is her wet speech, mild difficulty swallowing, which is improving also, but on today's interview, she has difficulty handling her saliva, complains difficulty swallowing, food stuck in her throat, she is eating close to regular diet at home now, only choke occasionally.   REVIEW OF SYSTEMS: Out of a complete 14 system review of symptoms, the patient complains only of the following symptoms, left weakness, dysarthria, left knee and left shoulder pain, imbalance and all other reviewed systems are negative.   ALLERGIES: Allergies  Allergen Reactions   Banana Anaphylaxis   Codeine Nausea And Vomiting and Other (See Comments)    PROJECTILE VOMITING   Stevia [Stevioside] Swelling and Other (See Comments)    Face, tongue, and eye swelling    Actos [Pioglitazone] Swelling   Banana Flavor    Benicar [Olmesartan Medoxomil] Other (See Comments)    Feel bad   Benicar [Olmesartan]    Chantix [Varenicline] Other (See Comments)    "Worked opposite."   Diovan [Valsartan] Other (See Comments)    Feel bad   Lipitor [  Atorvastatin] Other (See Comments)    REACTION:  Joint pain   Rice Other (See Comments)    Gives patient indigestion   Spinach Other (See Comments)    Stomach problems   Statins Other (See Comments)    Feels bad, leg cramps, worries about liver effects.   Vicodin [Hydrocodone-Acetaminophen] Nausea And Vomiting   Zocor [Simvastatin] Other (See Comments)    REACTION:  "made liver function  incorrectly"   Ace Inhibitors Cough   Celebrex [Celecoxib] Rash   Latex Rash and Other (See Comments)    REACTION: red rash   Metformin And Related     GI - upset    Mushroom Extract Complex Palpitations   Penicillins Rash and Other (See Comments)    Has patient had a PCN reaction causing immediate rash, facial/tongue/throat swelling, SOB or lightheadedness with hypotension: No Has patient had a PCN reaction causing severe rash involving mucus membranes or skin necrosis: No Has patient had a PCN reaction that required hospitalization: No- MD office Has patient had a PCN reaction occurring within the last 10 years: No If all of the above answers are "NO", then may proceed with Cephalosporin use.    Sulfonamide Derivatives Rash     HOME MEDICATIONS: Outpatient Medications Prior to Visit  Medication Sig Dispense Refill   albuterol (VENTOLIN HFA) 108 (90 Base) MCG/ACT inhaler INHALE TWO PUFFS BY MOUTH EVERY 6 HOURS AS NEEDED FOR wheezing OR SHORTNESS OF BREATH 8.5 g 2   amLODipine (NORVASC) 10 MG tablet TAKE 1 TABLET BY MOUTH EVERY DAY 90 tablet 5   azelastine (ASTELIN) 0.1 % nasal spray Place 2 sprays into both nostrils 2 (two) times daily. Use in each nostril as directed 30 mL 12   chlorpheniramine (ALLERGY RELIEF) 4 MG tablet Take 4 mg by mouth 2 (two) times daily as needed for allergies.     clopidogrel (PLAVIX) 75 MG tablet TAKE 1 TABLET BY MOUTH DAILY 90 tablet 1   cyclobenzaprine (FLEXERIL) 5 MG tablet TAKE 1 TABLET BY MOUTH AT BEDTIME 30 tablet 1   docusate sodium (COLACE) 100 MG capsule Take 1 capsule (100 mg total) by mouth daily. 30 capsule 0   empagliflozin (JARDIANCE) 10 MG TABS tablet Take 1 tablet (10 mg total) by mouth daily before breakfast. 14 tablet 0   ezetimibe (ZETIA) 10 MG tablet Take 1 tablet (10 mg total) by mouth daily. 90 tablet 3   Grape Seed OIL by Does not apply route daily at 6 (six) AM.     hydrochlorothiazide (HYDRODIURIL) 12.5 MG tablet Take 1 tablet  (12.5 mg total) by mouth daily. 90 tablet 3   ibuprofen (ADVIL) 800 MG tablet TAKE 1 TABLET BY MOUTH EVERY 8 HOURS AS NEEDED 90 tablet 1   meclizine (ANTIVERT) 12.5 MG tablet TAKE 1 TABLET BY MOUTH THREE TIMES DAILY AS NEEDED FOR DIZZINESS 15 tablet 0   montelukast (SINGULAIR) 10 MG tablet TAKE 1 TABLET BY MOUTH AT BEDTIME 30 tablet 3   ondansetron (ZOFRAN) 4 MG tablet TAKE 1 TABLET BY MOUTH EVERY 6 HOURS 60 tablet 1   OVER THE COUNTER MEDICATION daily at 6 (six) AM. brain essense     pantoprazole (PROTONIX) 40 MG tablet TAKE 1 TABLET BY MOUTH DAILY BEFORE BREAKFAST 90 tablet 1   polyethylene glycol-electrolytes (TRILYTE) 420 g solution Take 4,000 mLs by mouth as directed. 4000 mL 0   REPATHA SURECLICK 140 MG/ML SOAJ INJECT 1 ML into THE SKIN EVERY 14 DAYS 6 mL 3   Specialty  Vitamins Products (COLLAGEN ULTRA PO) Take 60 mg by mouth 1 day or 1 dose. Collagen peptides     UNABLE TO FIND 2 (two) times daily. Med Name: cinnamon, chromium & biotin     UNABLE TO FIND 1 day or 1 dose. Med Name: curamin 1/2 tablet for pain     ursodiol (ACTIGALL) 300 MG capsule TAKE ONE CAPSULE BY MOUTH THREE TIMES DAILY 270 capsule 3   Vitamin D, Ergocalciferol, (DRISDOL) 1.25 MG (50000 UNIT) CAPS capsule TAKE ONE CAPSULE BY MOUTH WEEKLY 10 capsule 2   methocarbamol (ROBAXIN) 500 MG tablet Take 1 tablet (500 mg total) by mouth every 6 (six) hours as needed for muscle spasms. (Patient not taking: Reported on 01/27/2023) 30 tablet 0   oxybutynin (DITROPAN XL) 15 MG 24 hr tablet 1 tablet DAILY (route: oral) (Patient not taking: Reported on 01/27/2023)     No facility-administered medications prior to visit.     PAST MEDICAL HISTORY: Past Medical History:  Diagnosis Date   Absolute anemia 07/28/2018   Active smoker    Allergy    Anxiety    Takes Xanax for anxiety   Arthritis    Asthma    Cataract    Chronic airway obstruction, not elsewhere classified    Clotting disorder (HCC)    stoke    Depression     Diarrhea    Diverticulosis of colon (without mention of hemorrhage)    Esophageal reflux    Family history of malignant neoplasm of gastrointestinal tract    GI bleed    Headache(784.0)    Irregular   Neuromuscular disorder (HCC)    numbness is left hand and left elbow   Obesity, unspecified    Other and unspecified hyperlipidemia    Personal history of colonic polyps    Stricture and stenosis of esophagus    Stroke (HCC)    Thyroid disease    Type II or unspecified type diabetes mellitus without mention of complication, not stated as uncontrolled    Unspecified asthma(493.90)    Unspecified essential hypertension      PAST SURGICAL HISTORY: Past Surgical History:  Procedure Laterality Date   APPENDECTOMY     BACK SURGERY     Spinal surgery   BRAIN SURGERY     2013 - stoke ( fall in 2010)    CARDIAC CATHETERIZATION  2006   LAD: 30%, RCA : 20%, normal EF, elevated LVEDP   CARDIAC CATHETERIZATION     CEREBRAL ANGIOGRAM  12/20/2015   COLONOSCOPY N/A 08/09/2018   Procedure: COLONOSCOPY;  Surgeon: Malissa Hippo, MD;  [facility];  Service: Endoscopy;  Laterality: N/A;  1:55   ESOPHAGEAL DILATION N/A 08/09/2018   Procedure: ESOPHAGEAL DILATION;  Surgeon: Malissa Hippo, MD;  [facility]: [facility];  Service: Endoscopy;  Laterality: N/A;   ESOPHAGOGASTRODUODENOSCOPY N/A 08/09/2018   Procedure: ESOPHAGOGASTRODUODENOSCOPY (EGD);  Surgeon: Malissa Hippo, MD;  [facility];  Service: Endoscopy;  Laterality: N/A;   GIVENS CAPSULE STUDY N/A 08/25/2018   Procedure: GIVENS CAPSULE STUDY;  Surgeon: Malissa Hippo, MD;  [facility];  Service: Endoscopy;  Laterality: N/A;   IR ANGIO INTRA EXTRACRAN SEL COM CAROTID INNOMINATE BILAT MOD SED  01/14/2018   IR ANGIO INTRA EXTRACRAN SEL COM CAROTID INNOMINATE BILAT MOD SED  05/11/2018   IR ANGIO VERTEBRAL SEL SUBCLAVIAN INNOMINATE UNI L MOD SED  01/14/2018   IR ANGIO VERTEBRAL SEL SUBCLAVIAN INNOMINATE UNI L  MOD SED  05/11/2018   IR ANGIO VERTEBRAL SEL VERTEBRAL UNI R MOD SED  01/14/2018   IR ANGIO VERTEBRAL SEL VERTEBRAL UNI R MOD SED  05/11/2018   KNEE ARTHROSCOPY     left   LUMBAR DISC SURGERY     POLYPECTOMY  08/09/2018   Procedure: POLYPECTOMY;  Surgeon: Malissa Hippo, MD;  [facility]: [facility];  Service: Endoscopy;;  ascending colon (CS x 2, HS x5) hepatic flexure (HSx1), recto-sigmoid (HSx1)   TONSILLECTOMY     TOTAL ABDOMINAL HYSTERECTOMY     menopause/bleeding     FAMILY HISTORY: Family History  Problem Relation Age of Onset   Diabetes Mother    Heart disease Mother    Asthma Mother    Kidney disease Mother    Drug abuse Mother    Hypertension Mother    Stroke Mother        fall -several bleed on brain 22   Early death Father        trauma   Colon cancer Other        first cousin, paternal aunt and uncle   Cancer Paternal Grandmother        gastric   Diabetes Paternal Grandmother    Mental illness Paternal Grandmother    Heart disease Maternal Grandmother    Thyroid cancer Other        aunt   Heart disease Sister    Mental illness Sister    Heart attack Sister    Heart disease Brother    Lung disease Brother    Cancer Sister        pancreatic   Diabetes Sister    Hypertension Sister    Tuberculosis Maternal Grandfather    Heart disease Paternal Grandfather      SOCIAL HISTORY: Social History   Socioeconomic History   Marital status: Married    Spouse name: [name]   Number of children: 0   Years of education: 16   Highest education level: Bachelor's degree (e.g., BA, AB, BS)  Occupational History   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   Tobacco Use   Smoking status: Every Day    Types: E-cigarettes    Passive exposure: Current   Smokeless tobacco: Never  Vaping Use   Vaping status: Some Days   Substances: THC, CBD, Flavoring, Nicotine-salt, Synthetic cannabinoids, Mixture of cannabinoids, Homemade substance  Substance and  Sexual Activity   Alcohol use: No    Alcohol/week: 0.0 standard drinks of alcohol   Drug use: No   Sexual activity: Not Currently  Other Topics Concern   Not on file  Social History Narrative   Married [name]   Limited activity due to back pain   Social Determinants of Health   Financial Resource Strain: Low Risk  (01/28/2021)   Overall Financial Resource Strain (CARDIA)    Difficulty of Paying Living Expenses: Not hard at all  Food Insecurity: No Food Insecurity (01/28/2021)   Hunger Vital Sign    Worried About Running Out of Food in the Last Year: Never true    Ran Out of Food in the Last Year: Never true  Transportation Needs: No Transportation Needs (01/28/2021)   PRAPARE - Administrator, Civil Service (Medical): No    Lack of Transportation (Non-Medical): No  Physical Activity: Insufficiently Active (01/28/2021)   Exercise Vital Sign    Days of Exercise per Week: 3 days    Minutes of Exercise per Session: 20  min  Stress: No Stress Concern Present (01/28/2021)   [employer] of Occupational Health - Occupational Stress Questionnaire    Feeling of Stress : Not at all  Social Connections: Unknown (01/06/2022)   Received from [facility]   Social Network    Social Network: Not on file  Intimate Partner Violence: Unknown (12/10/2021)   Received from [facility]   HITS    Physically Hurt: Not on file    Insult or Talk Down To: Not on file    Threaten Physical Harm: Not on file    Scream or Curse: Not on file     PHYSICAL EXAM  Vitals:   04/09/23 0923  BP: (!) 165/86  Pulse: 100  Weight: 135 lb (61.2 kg)  Height: 5\' 2"  (1.575 m)   Body mass index is 24.69 kg/m.  Generalized: Well developed, in no acute distress  Cardiology: normal rate and rhythm, no murmur auscultated  Respiratory: clear to auscultation bilaterally    Neurological examination  Mentation: Alert oriented to time, place, history taking. Follows all  commands. Mild dysarthria noted.  Cranial nerve II-XII: Pupils were equal round reactive to light. Extraocular movements were full, visual field were full on confrontational test. Facial sensation and strength were normal. Uvula tongue midline. Head turning and shoulder shrug  were normal and symmetric. Motor: The motor testing reveals 5 over 5 strength of right upper and lower ext, 4+/5 left upper and lower ext. Good symmetric motor tone is noted throughout.  Sensory: Sensory testing is intact to soft touch on all 4 extremities. No evidence of extinction is noted.  Coordination: Cerebellar testing reveals good finger-nose-finger and heel-to-shin bilaterally.  Gait and station: Able to push to standing position. Gait is short but stable with walker. Tandem not attempted.    DIAGNOSTIC DATA (LABS, IMAGING, TESTING) - I reviewed patient records, labs, notes, testing and imaging myself where available.  Lab Results  Component Value Date   WBC 4.7 10/24/2022   HGB 10.9 (L) 10/24/2022   HCT 34.0 10/24/2022   MCV 79 10/24/2022   PLT 270 10/24/2022      Component Value Date/Time   NA 139 02/09/2023 0804   K 3.7 02/09/2023 0804   CL 102 02/09/2023 0804   CO2 25 02/09/2023 0804   GLUCOSE 113 (H) 02/09/2023 0804   GLUCOSE 103 (H) 12/10/2021 0506   BUN 15 02/09/2023 0804   CREATININE 0.62 02/09/2023 0804   CREATININE 0.71 08/09/2013 0828   CALCIUM 9.8 02/09/2023 0804   PROT 6.5 10/24/2022 0839   ALBUMIN 4.1 10/24/2022 0839   AST 18 10/24/2022 0839   ALT 11 10/24/2022 0839   ALKPHOS 121 10/24/2022 0839   BILITOT 0.3 10/24/2022 0839   GFRNONAA >60 12/10/2021 0506   GFRNONAA 87 08/09/2013 0828   GFRAA 84 10/03/2020 0838   GFRAA >89 08/09/2013 0828   Lab Results  Component Value Date   CHOL 120 10/24/2022   HDL 63 10/24/2022   LDLCALC 43 10/24/2022   TRIG 66 10/24/2022   CHOLHDL 1.9 10/24/2022   Lab Results  Component Value Date   HGBA1C 5.7 (H) 10/24/2022   Lab Results   Component Value Date   VITAMINB12 642 11/14/2020   Lab Results  Component Value Date   TSH 1.490 10/24/2022        No data to display               No data to display  ASSESSMENT AND PLAN  82 y.o. year old female  has a past medical history of Absolute anemia (07/28/2018), Active smoker, Allergy, Anxiety, Arthritis, Asthma, Cataract, Chronic airway obstruction, not elsewhere classified, Clotting disorder (HCC), Depression, Diarrhea, Diverticulosis of colon (without mention of hemorrhage), Esophageal reflux, Family history of malignant neoplasm of gastrointestinal tract, GI bleed, Headache(784.0), Neuromuscular disorder (HCC), Obesity, unspecified, Other and unspecified hyperlipidemia, Personal history of colonic polyps, Stricture and stenosis of esophagus, Stroke (HCC), Thyroid disease, Type II or unspecified type diabetes mellitus without mention of complication, not stated as uncontrolled, Unspecified asthma(493.90), and Unspecified essential hypertension. here with    Cerebrovascular accident (CVA) due to thrombosis of cerebral artery (HCC)  Hemiparesis affecting left side as late effect of cerebrovascular accident [facility])  Dysarthria  Zaynab Atienza Vanetten reports doing better. She continues to have some imbalance, left sided weakness and dysarthria. We have reviewed options for PT/OT/ST. She wishes to discuss with Dr Wynn Banker. I have included contact information in her summary. She was encouraged to follow up closely with her PCP as her BP was elevated in the office. She is a poor historian so I am not sure how well she is controlled, typically. She was advised to continue Repatha and Plavix per PCP and cardiology. Healthy lifestyle habits encouraged. She will follow up with PCP as directed. She will return to see me as needed. She verbalizes understanding and agreement with this plan.   No orders of the defined types were placed in this encounter.    No orders of the  defined types were placed in this encounter.   I spent 30 minutes of face-to-face and non-face-to-face time with patient.  This included previsit chart review, lab review, study review, order entry, electronic health record documentation, patient education.   Shawnie Dapper, MSN, FNP-C 04/09/2023, 4:40 PM  [facility] [address], [address] [place], [place] [address] [phone]

## 2023-04-22 ENCOUNTER — Other Ambulatory Visit (HOSPITAL_COMMUNITY): Payer: Self-pay

## 2023-04-22 ENCOUNTER — Telehealth: Payer: Self-pay

## 2023-04-22 NOTE — Telephone Encounter (Signed)
Pharmacy Patient Advocate Encounter   Received notification from CoverMyMeds that prior authorization for REPATHA is required/requested.   Insurance verification completed.   The patient is insured through [facility] .   Per test claim: PA required; PA submitted to [facility] via CoverMyMeds Key/confirmation #/EOC [id number]   Status is pending

## 2023-04-23 ENCOUNTER — Other Ambulatory Visit (HOSPITAL_COMMUNITY): Payer: Self-pay

## 2023-04-23 NOTE — Telephone Encounter (Signed)
Pharmacy Patient Advocate Encounter  Received notification from [facility] that Prior Authorization for REPATHA has been APPROVED from 1.1.24 to 12.31.24   PA #/Case ID/Reference #:   (Key: [id number])   Please see tesr claim below as the Rx has been pickedup and is not yet due.

## 2023-04-25 ENCOUNTER — Other Ambulatory Visit: Payer: Self-pay | Admitting: Family Medicine

## 2023-04-25 DIAGNOSIS — J441 Chronic obstructive pulmonary disease with (acute) exacerbation: Secondary | ICD-10-CM

## 2023-04-27 ENCOUNTER — Ambulatory Visit: Payer: Medicare Other | Admitting: Family Medicine

## 2023-04-29 ENCOUNTER — Other Ambulatory Visit: Payer: Self-pay | Admitting: Family Medicine

## 2023-04-30 ENCOUNTER — Other Ambulatory Visit: Payer: Self-pay | Admitting: Family Medicine

## 2023-04-30 DIAGNOSIS — E1169 Type 2 diabetes mellitus with other specified complication: Secondary | ICD-10-CM

## 2023-05-28 ENCOUNTER — Encounter: Payer: Self-pay | Admitting: Nurse Practitioner

## 2023-05-28 ENCOUNTER — Ambulatory Visit: Payer: Medicare Other | Attending: Nurse Practitioner | Admitting: Nurse Practitioner

## 2023-05-28 ENCOUNTER — Other Ambulatory Visit: Payer: Self-pay | Admitting: Family Medicine

## 2023-05-28 VITALS — BP 130/64 | HR 93 | Ht 62.0 in | Wt 132.0 lb

## 2023-05-28 DIAGNOSIS — R059 Cough, unspecified: Secondary | ICD-10-CM | POA: Diagnosis not present

## 2023-05-28 DIAGNOSIS — E1159 Type 2 diabetes mellitus with other circulatory complications: Secondary | ICD-10-CM

## 2023-05-28 DIAGNOSIS — E785 Hyperlipidemia, unspecified: Secondary | ICD-10-CM

## 2023-05-28 DIAGNOSIS — M546 Pain in thoracic spine: Secondary | ICD-10-CM

## 2023-05-28 DIAGNOSIS — J45909 Unspecified asthma, uncomplicated: Secondary | ICD-10-CM

## 2023-05-28 DIAGNOSIS — I2510 Atherosclerotic heart disease of native coronary artery without angina pectoris: Secondary | ICD-10-CM

## 2023-05-28 DIAGNOSIS — I639 Cerebral infarction, unspecified: Secondary | ICD-10-CM

## 2023-05-28 DIAGNOSIS — I10 Essential (primary) hypertension: Secondary | ICD-10-CM

## 2023-05-28 DIAGNOSIS — I152 Hypertension secondary to endocrine disorders: Secondary | ICD-10-CM

## 2023-05-28 DIAGNOSIS — Z8673 Personal history of transient ischemic attack (TIA), and cerebral infarction without residual deficits: Secondary | ICD-10-CM

## 2023-05-28 DIAGNOSIS — I63031 Cerebral infarction due to thrombosis of right carotid artery: Secondary | ICD-10-CM

## 2023-05-28 DIAGNOSIS — R1310 Dysphagia, unspecified: Secondary | ICD-10-CM

## 2023-05-28 DIAGNOSIS — R0689 Other abnormalities of breathing: Secondary | ICD-10-CM

## 2023-05-28 NOTE — Patient Instructions (Addendum)
Medication Instructions:  Your physician recommends that you continue on your current medications as directed. Please refer to the Current Medication list given to you today.  Labwork: In 1 week   Testing/Procedures: A chest x-ray takes a picture of the organs and structures inside the chest, including the heart, lungs, and blood vessels. This test can show several things, including, whether the heart is enlarges; whether fluid is building up in the lungs; and whether pacemaker / defibrillator leads are still in place.   Follow-Up: Your physician recommends that you schedule a follow-up appointment in: 6 Months   Any Other Special Instructions Will Be Listed Below (If Applicable).  If you need a refill on your cardiac medications before your next appointment, please call your pharmacy.

## 2023-05-28 NOTE — Progress Notes (Signed)
Cardiology Office Note:  .   Date:  05/28/2023 ID:  Elizabeth [name], DOB 1940/11/11, MRN [id number] PCP: Gilmore Laroche, FNP  [facility] Providers Cardiologist:  Dina Rich, MD    History of Present Illness: Elizabeth [name] Kitchen   Elizabeth [name] is a 82 y.o. female with a PMH of CAD, hyperlipidemia, hypertension, type 2 diabetes, asthma, history of GI bleed, GERD, anxiety, depression, diverticulosis, obesity, and history of CVA, who presents today for 1 year follow-up.  History of cardiac cath in 2006 revealed mild nonobstructive CAD.  Last seen by Dr. Dina Rich on February 21, 2022.  She was overall doing well at the time.  Intolerant to statins and was resistant to PCSK9 inhibitors but due to her recent stroke at the time, she was willing to start these medications.  Lipid clinic referral was placed.  Today she presents for 1 year follow-up with her husband who is her caregiver.  She is doing well from a cardiac perspective. Does endorse some difficulty swallowing and has coughing/phlegm with this. Denies any chest pain, shortness of breath, palpitations, syncope, presyncope, dizziness, orthopnea, PND, swelling or significant weight changes, acute bleeding, or claudication. She has completed therapy after her stroke per her husband's report. Still has generalized weakness and some deficits after her stroke including left facial droop, mumbled speech and generalized weakness.   Stage: Patient is a retired Engineer, civil (consulting).  ROS: Negative. See HPI.  Studies Reviewed: Elizabeth [name] Kitchen    EKG:  EKG Interpretation Date/Time:  Thursday May 28 2023 15:13:58 EDT Ventricular Rate:  102 PR Interval:  176 QRS Duration:  108 QT Interval:  352 QTC Calculation: 458 R Axis:   -81  Text Interpretation: Sinus tachycardia with Premature ventricular complexes or Fusion complexes Left axis deviation Pulmonary disease pattern Incomplete right bundle branch block When compared with ECG of 27-Feb-2012 10:57, Fusion complexes are  now Present Premature ventricular complexes are now Present Vent. rate has increased BY  42 BPM Incomplete right bundle branch block has replaced Right bundle branch block Confirmed by Sharlene Dory [phone]) on 05/28/2023 3:32:30 PM   11/2021 Echo [facility]): Summary    1. The left ventricle is normal in size with normal wall thickness.    2. The left ventricular systolic function is normal, LVEF is visually  estimated at > 55%.    3. There is grade I diastolic dysfunction (impaired relaxation).    4. The left atrium is mildly dilated in size.    5. The right ventricle is normal in size, with normal systolic function.    6. Agitated saline study is negative.   Carotid doppler 02/2012: Summary:   - Bilaterally No significant internal carotid artery    stenosis demonstrated. Vertebrals are patent with    antegrade flow.  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  Other specific details can be found in the table(s) above.  Echo 11/2011: Study Conclusions   - Left ventricle: The cavity size was normal. Wall thickness    was increased in a pattern of mild LVH. Systolic function    was normal. The estimated ejection fraction was in the    range of 60% to 65%. Wall motion was normal; there were no    regional wall motion abnormalities. Doppler parameters are    consistent with abnormal left ventricular relaxation    (grade 1 diastolic dysfunction). Doppler parameters are  consistent with elevated mean left atrial filling    pressure.  - Aortic valve: Valve area: 3.02cm^2(VTI). Valve area:    2.57cm^2 (Vmax).  [facility] 12/2004: CARDIAC CATHETERIZATION   INDICATIONS: Ms. Seja is a pleasant 82 year old woman with a history of  hypertension, dyslipidemia, type 2 diabetes mellitus, tobacco use, and  asthma. She has had recent problems with mild dyspnea and lower extremity  edema. Given her cardiac  risk factor profile, she is referred for diagnostic coronary angiography and left ventriculography to assess coronary anatomy  and left ventricular function.   PROCEDURE:   1. Left heart catheterization.   2. Selective coronary angiography.   3. Left ventriculography.   DESCRIPTION OF PROCEDURE: The area about the right femoral artery was  anesthetized 1% lidocaine and a 4-French sheath was placed in the right  femoral artery via modified Seldinger technique. Standard preformed 4-French  JL-4 and JR-4 catheters used for selective coronary angiography and angled  pigtail catheters used for left heart catheterization and left  ventriculography. All exchanges were made over wire. The patient tolerated  procedure well without immediate complications.   HEMODYNAMIC RESULTS:   1. Left ventricle 151/25 mmHg.   2. Aorta 152/76 mmHg.   ANGIOGRAPHIC FINDINGS:   1. Left main coronary artery is free of significant flow-limiting coronary atherosclerosis.   2. The left anterior descending is a medium caliber vessel with three small  diagonal branches. There are minor Luminal irregularities noted throughout this system with approximately 30% diffuse mid-vessel stenosis. There is also a 30-40% ostial stenosis involving the first diagonal branch. No flow-limiting stenoses are noted.   3. The circumflex coronary artery is a medium caliber vessel. There is a very small branch in the AV groove and two larger bifurcating obtuse marginal branches. Minor Luminal irregularities are noted without flow-limiting stenoses.   4. The right coronary artery is a medium caliber vessel with posterior descending branch. Minor Luminal irregularities are noted. There is a  20% stenosis in the proximal to mid-vessel.   LEFT VENTRICULOGRAPHY: Left ventriculography was performed in the RAO projection revealing an ejection fraction approximately 65% in the setting of ventricular ectopy without significant mitral regurgitation or focal wall  motion abnormality.   DIAGNOSES:   1. Mild coronary atherosclerosis as outlined with no flow-limiting stenoses in the major epicardial vessels.   2. Left ventricular ejection fraction of approximately 65% in the setting of ventricular ectopy. No significant mitral regurgitation is noted. The left ventricular end-diastolic pressure is increased at 25 mmHg. At this point would suspect an element of diastolic dysfunction particularly given elevated left renal end-diastolic pressure. The patient is already on an ACE inhibitor and diuretic. We will plan to continue maximizing medical therapy. We will arrange a follow-up 2-D echocardiogram for diastolic parameters and also to exclude significantly elevated pulmonary pressures. She will have follow-up in the office to review this and her progress.  Physical Exam:   VS:  BP 130/64   Pulse 93   Ht 5\' 2"  (1.575 m)   Wt 132 lb (59.9 kg)   SpO2 95%   BMI 24.14 kg/m    Wt Readings from Last 3 Encounters:  05/28/23 132 lb (59.9 kg)  04/09/23 135 lb (61.2 kg)  01/27/23 132 lb 0.6 oz (59.9 kg)    GEN: Well nourished, well developed in no acute distress NECK: No JVD; No carotid bruits CARDIAC: S1/S2, slightly irregular rhythm, no murmurs, rubs, gallops RESPIRATORY:  Diminished to auscultation with scattered fine expiratory wheezing/crackles bilaterally, no  rales or rhonchi noted.  ABDOMEN: Soft, non-tender, non-distended EXTREMITIES:  No edema; No deformity   ASSESSMENT AND PLAN: .    CAD Stable with no anginal symptoms. No indication for ischemic evaluation. Continue current medication regimen. Heart healthy diet encouraged. Will obtain CBC and BMET in 1 week.   HTN Blood pressure stable and at goal.  Husband reports vital signs are stable at home including heart rate and blood pressure.  Discussed to monitor BP at home at least 2 hours after medications and sitting for 5-10 minutes.  No medication changes at this time. Heart healthy diet encouraged.    HLD LDL 43 10/2022.  She is currently at goal.  Continue current medication regimen. Heart healthy diet encouraged.   Cough, difficulty swallowing, adventitious breath sounds, hx of CVA Patient and husband admit to cough, difficulty swallowing. Most likely d/t after her stroke. Some deficits of stroke noted on exam. See PE above. Will obtain 2 view CXR to r/o any abnormalities and obtain CBC and BMET. Continue to follow with PCP.  Dispo: Follow-up with Dr. Dina Rich or APP in 6 months or sooner anything changes.  Signed, Sharlene Dory, NP

## 2023-06-08 ENCOUNTER — Ambulatory Visit (HOSPITAL_COMMUNITY)
Admission: RE | Admit: 2023-06-08 | Discharge: 2023-06-08 | Disposition: A | Discharge status: Home / Self Care | Payer: Medicare Other | Source: Ambulatory Visit | Attending: Nurse Practitioner | Admitting: Nurse Practitioner

## 2023-06-08 ENCOUNTER — Other Ambulatory Visit (HOSPITAL_COMMUNITY)
Admission: RE | Admit: 2023-06-08 | Discharge: 2023-06-08 | Disposition: A | Discharge status: Home / Self Care | Payer: Medicare Other | Source: Ambulatory Visit | Attending: Nurse Practitioner | Admitting: Nurse Practitioner

## 2023-06-08 DIAGNOSIS — R0689 Other abnormalities of breathing: Secondary | ICD-10-CM | POA: Diagnosis present

## 2023-06-08 DIAGNOSIS — Z8673 Personal history of transient ischemic attack (TIA), and cerebral infarction without residual deficits: Secondary | ICD-10-CM

## 2023-06-08 DIAGNOSIS — I10 Essential (primary) hypertension: Secondary | ICD-10-CM

## 2023-06-08 DIAGNOSIS — E1159 Type 2 diabetes mellitus with other circulatory complications: Secondary | ICD-10-CM | POA: Insufficient documentation

## 2023-06-08 LAB — CBC
HCT: 35.4 % — ABNORMAL LOW (ref 36.0–46.0)
Hemoglobin: 10.6 g/dL — ABNORMAL LOW (ref 12.0–15.0)
MCH: 24.3 pg — ABNORMAL LOW (ref 26.0–34.0)
MCHC: 29.9 g/dL — ABNORMAL LOW (ref 30.0–36.0)
MCV: 81.2 fL (ref 80.0–100.0)
Platelets: 257 10*3/uL (ref 150–400)
RBC: 4.36 MIL/uL (ref 3.87–5.11)
RDW: 14.7 % (ref 11.5–15.5)
WBC: 4.2 10*3/uL (ref 4.0–10.5)
nRBC: 0 % (ref 0.0–0.2)

## 2023-06-08 LAB — BASIC METABOLIC PANEL
Anion gap: 10 (ref 5–15)
BUN: 21 mg/dL (ref 8–23)
CO2: 27 mmol/L (ref 22–32)
Calcium: 9.7 mg/dL (ref 8.9–10.3)
Chloride: 100 mmol/L (ref 98–111)
Creatinine, Ser: 0.6 mg/dL (ref 0.44–1.00)
GFR, Estimated: >60 mL/min (ref >60)
Glucose, Bld: 119 mg/dL — ABNORMAL HIGH (ref 70–99)
Potassium: 3.8 mmol/L (ref 3.5–5.1)
Sodium: 137 mmol/L (ref 135–145)

## 2023-06-15 ENCOUNTER — Ambulatory Visit (INDEPENDENT_AMBULATORY_CARE_PROVIDER_SITE_OTHER): Payer: Medicare Other | Admitting: Family Medicine

## 2023-06-15 ENCOUNTER — Encounter: Payer: Self-pay | Admitting: Family Medicine

## 2023-06-15 VITALS — BP 127/78 | HR 98 | Ht 62.0 in | Wt 133.1 lb

## 2023-06-15 DIAGNOSIS — E1169 Type 2 diabetes mellitus with other specified complication: Secondary | ICD-10-CM | POA: Diagnosis not present

## 2023-06-15 DIAGNOSIS — E559 Vitamin D deficiency, unspecified: Secondary | ICD-10-CM | POA: Diagnosis not present

## 2023-06-15 DIAGNOSIS — E038 Other specified hypothyroidism: Secondary | ICD-10-CM | POA: Diagnosis not present

## 2023-06-15 DIAGNOSIS — Z0001 Encounter for general adult medical examination with abnormal findings: Secondary | ICD-10-CM | POA: Diagnosis not present

## 2023-06-15 DIAGNOSIS — E785 Hyperlipidemia, unspecified: Secondary | ICD-10-CM

## 2023-06-15 NOTE — Progress Notes (Signed)
Complete physical exam  Patient: Elizabeth [name]   DOB: [birth year]   82 y.o. Female  MRN: [id number]  Subjective:    Chief Complaint  Patient presents with   Annual Exam    Cpe today   Fall    Pt reports a fall this morning.     Elizabeth [name] is a 82 y.o. female who presents today for a complete physical exam. She reports consuming a general diet.  The patient reports completing at home physical therapy exercises.  She uses her walker  to ambulate without falls.  She generally feels well. She reports sleeping fairly well. She does not have additional problems to discuss today.    Most recent fall risk assessment:    06/15/2023   10:41 AM  Fall Risk   Falls in the past year? 0  Number falls in past yr: 0  Injury with Fall? 0  Risk for fall due to : No Fall Risks  Follow up Falls evaluation completed     Most recent depression screenings:    06/15/2023   10:41 AM 01/27/2023   11:26 AM  PHQ 2/9 Scores  PHQ - 2 Score 0 2  PHQ- 9 Score 0 5    Dental: No current dental problems and No regular dental care   Patient Active Problem List   Diagnosis Date Noted   Midline thoracic back pain 10/13/2022   Viral illness 09/10/2022   Hemiplga following cerebral infrc affecting left nondom side (HCC) 06/18/2022   Need for immunization against influenza 06/10/2022   Encounter for annual general medical examination with abnormal findings in adult 06/10/2022   Constipation 05/27/2022   Drooling 02/07/2022   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) 01/30/2022   Cerebrovascular accident (CVA) due to thrombosis of cerebral artery (HCC) 01/30/2022   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) 01/30/2022   Dysphagia 01/30/2022   Dizziness 01/29/2022   Fall 12/27/2021   Abnormal liver ultrasound 12/26/2021   Allergic sinusitis 12/23/2021   Right-sided lacunar stroke (HCC) 11/19/2021   Primary biliary cholangitis  (HCC) 06/26/2020   History of iron deficiency anemia 12/22/2019   IDA (iron deficiency anemia) 07/04/2019   Hepatic fibrosis 07/04/2019   Hyperlipidemia associated with type 2 diabetes mellitus (HCC) 03/25/2019   Rectal bleeding    Esophageal dysphagia 07/28/2018   Stroke (HCC)    Anxiety and depression 10/21/2017   Abdominal aortic atherosclerosis (HCC) 10/21/2017   Type 2 diabetes mellitus with hyperlipidemia (HCC) 09/06/2015   Type 2 diabetes mellitus with peripheral neuropathy (HCC) 11/27/2014   CVA (cerebral vascular accident) (HCC) 02/27/2012   Cerebral aneurysm 02/27/2012   Tobacco abuse 02/27/2012   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 02/27/2012   Gait instability 02/27/2012   Vision disturbance following cerebrovascular accident 02/27/2012   Dyspnea 11/14/2011   Thyroid disease 10/12/2008   Diabetes (HCC) 08/29/2008   Obesity, unspecified 08/29/2008   COPD (chronic obstructive pulmonary disease) (HCC) 08/29/2008   WEIGHT LOSS 08/29/2008   Hypertension associated with diabetes (HCC) 08/28/2008   ASTHMA 08/28/2008   ESOPHAGEAL STRICTURE 08/28/2008   GERD (gastroesophageal reflux disease) 08/28/2008   HIATAL HERNIA 08/28/2008   Diverticulosis of colon 08/28/2008   Past Medical History:  Diagnosis Date   Absolute anemia 07/28/2018   Active smoker    Allergy    Anxiety    Takes Xanax for anxiety   Arthritis    Asthma    Cataract  Chronic airway obstruction, not elsewhere classified    Clotting disorder (HCC)    stoke    Depression    Diarrhea    Diverticulosis of colon (without mention of hemorrhage)    Esophageal reflux    Family history of malignant neoplasm of gastrointestinal tract    GI bleed    Headache(784.0)    Irregular   Neuromuscular disorder (HCC)    numbness is left hand and left elbow   Obesity, unspecified    Other and unspecified hyperlipidemia    Personal history of colonic polyps    Stricture and stenosis of esophagus    Stroke (HCC)     Thyroid disease    Type II or unspecified type diabetes mellitus without mention of complication, not stated as uncontrolled    Unspecified asthma(493.90)    Unspecified essential hypertension    Past Surgical History:  Procedure Laterality Date   APPENDECTOMY     BACK SURGERY     Spinal surgery   BRAIN SURGERY     2013 - stoke ( fall in 2010)    CARDIAC CATHETERIZATION  2006   LAD: 30%, RCA : 20%, normal EF, elevated LVEDP   CARDIAC CATHETERIZATION     CEREBRAL ANGIOGRAM  12/20/2015   COLONOSCOPY N/A 08/09/2018   Procedure: COLONOSCOPY;  Surgeon: Malissa Hippo, MD;  [facility]: [facility];  Service: Endoscopy;  Laterality: N/A;  1:55   ESOPHAGEAL DILATION N/A 08/09/2018   Procedure: ESOPHAGEAL DILATION;  Surgeon: Malissa Hippo, MD;  Location: [facility];  Service: Endoscopy;  Laterality: N/A;   ESOPHAGOGASTRODUODENOSCOPY N/A 08/09/2018   Procedure: ESOPHAGOGASTRODUODENOSCOPY (EGD);  Surgeon: Malissa Hippo, MD;  [facility]: [facility];  Service: Endoscopy;  Laterality: N/A;   GIVENS CAPSULE STUDY N/A 08/25/2018   Procedure: GIVENS CAPSULE STUDY;  Surgeon: Malissa Hippo, MD;  [facility]: [facility];  Service: Endoscopy;  Laterality: N/A;   IR ANGIO INTRA EXTRACRAN SEL COM CAROTID INNOMINATE BILAT MOD SED  01/14/2018   IR ANGIO INTRA EXTRACRAN SEL COM CAROTID INNOMINATE BILAT MOD SED  05/11/2018   IR ANGIO VERTEBRAL SEL SUBCLAVIAN INNOMINATE UNI L MOD SED  01/14/2018   IR ANGIO VERTEBRAL SEL SUBCLAVIAN INNOMINATE UNI L MOD SED  05/11/2018   IR ANGIO VERTEBRAL SEL VERTEBRAL UNI R MOD SED  01/14/2018   IR ANGIO VERTEBRAL SEL VERTEBRAL UNI R MOD SED  05/11/2018   KNEE ARTHROSCOPY     left   LUMBAR DISC SURGERY     POLYPECTOMY  08/09/2018   Procedure: POLYPECTOMY;  Surgeon: Malissa Hippo, MD;  Location: [facility];  Service: Endoscopy;;  ascending colon (CS x 2, HS x5) hepatic flexure (HSx1), recto-sigmoid (HSx1)   TONSILLECTOMY     TOTAL ABDOMINAL HYSTERECTOMY      menopause/bleeding   Social History   Tobacco Use   Smoking status: Every Day    Types: E-cigarettes    Passive exposure: Current   Smokeless tobacco: Never  Vaping Use   Vaping status: Some Days   Substances: THC, CBD, Flavoring, Nicotine-salt, Synthetic cannabinoids, Mixture of cannabinoids, Homemade substance  Substance Use Topics   Alcohol use: No    Alcohol/week: 0.0 standard drinks of alcohol   Drug use: No   Social History   Socioeconomic History   Marital status: Married    Spouse name: [name]   Number of children: 0   Years of education: 16   Highest education level: Bachelor's degree (e.g., BA, AB, BS)  Occupational  History   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   Tobacco Use   Smoking status: Every Day    Types: E-cigarettes    Passive exposure: Current   Smokeless tobacco: Never  Vaping Use   Vaping status: Some Days   Substances: THC, CBD, Flavoring, Nicotine-salt, Synthetic cannabinoids, Mixture of cannabinoids, Homemade substance  Substance and Sexual Activity   Alcohol use: No    Alcohol/week: 0.0 standard drinks of alcohol   Drug use: No   Sexual activity: Not Currently  Other Topics Concern   Not on file  Social History Narrative   Married [name]   Limited activity due to back pain   Social Determinants of Health   Financial Resource Strain: Low Risk  (01/28/2021)   Overall Financial Resource Strain (CARDIA)    Difficulty of Paying Living Expenses: Not hard at all  Food Insecurity: No Food Insecurity (01/28/2021)   Hunger Vital Sign    Worried About Running Out of Food in the Last Year: Never true    Ran Out of Food in the Last Year: Never true  Transportation Needs: No Transportation Needs (01/28/2021)   PRAPARE - Administrator, Civil Service (Medical): No    Lack of Transportation (Non-Medical): No  Physical Activity: Insufficiently Active (01/28/2021)   Exercise Vital Sign    Days of Exercise per Week: 3  days    Minutes of Exercise per Session: 20 min  Stress: No Stress Concern Present (01/28/2021)   [employer] of Occupational Health - Occupational Stress Questionnaire    Feeling of Stress : Not at all  Social Connections: Unknown (01/06/2022)   Received from [facility]   Social Network    Social Network: Not on file  Intimate Partner Violence: Unknown (12/10/2021)   Received from [facility]   HITS    Physically Hurt: Not on file    Insult or Talk Down To: Not on file    Threaten Physical Harm: Not on file    Scream or Curse: Not on file   Family Status  Relation Name Status   Mother  Deceased   Father  Deceased at age 48   Other  (Not Specified)   PGM  Deceased   MGM  Deceased   Other  (Not Specified)   Sister Britta Mccreedy Deceased   Brother edward Deceased   Sister martha Deceased   MGF  Deceased   PGF  Deceased   Other married Alive  No partnership data on file   Family History  Problem Relation Age of Onset   Diabetes Mother    Heart disease Mother    Asthma Mother    Kidney disease Mother    Drug abuse Mother    Hypertension Mother    Stroke Mother        fall -several bleed on brain 07/15/89   Early death Father        trauma   Colon cancer Other        first cousin, paternal aunt and uncle   Cancer Paternal Grandmother        gastric   Diabetes Paternal Grandmother    Mental illness Paternal Grandmother    Heart disease Maternal Grandmother    Thyroid cancer Other        aunt   Heart disease Sister    Mental illness Sister    Heart attack Sister    Heart disease Brother  Lung disease Brother    Cancer Sister        pancreatic   Diabetes Sister    Hypertension Sister    Tuberculosis Maternal Grandfather    Heart disease Paternal Grandfather    Allergies  Allergen Reactions   Banana Anaphylaxis   Codeine Nausea And Vomiting and Other (See Comments)    PROJECTILE VOMITING   Stevia [Stevioside] Swelling and Other  (See Comments)    Face, tongue, and eye swelling    Actos [Pioglitazone] Swelling   Banana Flavor    Benicar [Olmesartan Medoxomil] Other (See Comments)    Feel bad   Benicar [Olmesartan]    Chantix [Varenicline] Other (See Comments)    "Worked opposite."   Diovan [Valsartan] Other (See Comments)    Feel bad   Lipitor [Atorvastatin] Other (See Comments)    REACTION:  Joint pain   Rice Other (See Comments)    Gives patient indigestion   Spinach Other (See Comments)    Stomach problems   Statins Other (See Comments)    Feels bad, leg cramps, worries about liver effects.   Vicodin [Hydrocodone-Acetaminophen] Nausea And Vomiting   Zocor [Simvastatin] Other (See Comments)    REACTION:  "made liver function incorrectly"   Ace Inhibitors Cough   Celebrex [Celecoxib] Rash   Latex Rash and Other (See Comments)    REACTION: red rash   Metformin And Related     GI - upset    Mushroom Extract Complex Palpitations   Penicillins Rash and Other (See Comments)    Has patient had a PCN reaction causing immediate rash, facial/tongue/throat swelling, SOB or lightheadedness with hypotension: No Has patient had a PCN reaction causing severe rash involving mucus membranes or skin necrosis: No Has patient had a PCN reaction that required hospitalization: No- MD office Has patient had a PCN reaction occurring within the last 10 years: No If all of the above answers are "NO", then may proceed with Cephalosporin use.    Sulfonamide Derivatives Rash      Patient Care Team: Gilmore Laroche, FNP as PCP - General (Family Medicine) Wyline Mood, Dorothe Pea, MD as PCP - Cardiology (Cardiology) Alanson Puls, OD (Optometry) Malissa Hippo, MD (Inactive) as Consulting Physician (Gastroenterology) Julieanne Cotton, MD as Consulting Physician (Interventional Radiology)   Outpatient Medications Prior to Visit  Medication Sig   albuterol (VENTOLIN HFA) 108 (90 Base) MCG/ACT inhaler INHALE TWO PUFFS BY  MOUTH EVERY 6 HOURS AS NEEDED FOR wheezing OR SHORTNESS OF BREATH   amLODipine (NORVASC) 10 MG tablet TAKE 1 TABLET BY MOUTH EVERY DAY   azelastine (ASTELIN) 0.1 % nasal spray place TWO SPRAYS into BOTH nostrils TWICE DAILY   chlorpheniramine (ALLERGY RELIEF) 4 MG tablet Take 4 mg by mouth 2 (two) times daily as needed for allergies.   clopidogrel (PLAVIX) 75 MG tablet TAKE 1 TABLET BY MOUTH DAILY   cyclobenzaprine (FLEXERIL) 5 MG tablet TAKE 1 TABLET BY MOUTH AT BEDTIME   ezetimibe (ZETIA) 10 MG tablet TAKE 1 TABLET BY MOUTH DAILY   Grape Seed OIL by Does not apply route daily at 6 (six) AM.   hydrochlorothiazide (HYDRODIURIL) 12.5 MG tablet Take 1 tablet (12.5 mg total) by mouth daily.   ibuprofen (ADVIL) 800 MG tablet TAKE 1 TABLET BY MOUTH EVERY 8 HOURS AS NEEDED   meclizine (ANTIVERT) 12.5 MG tablet TAKE 1 TABLET BY MOUTH THREE TIMES DAILY AS NEEDED FOR DIZZINESS   methocarbamol (ROBAXIN) 500 MG tablet Take 1 tablet (500 mg total) by  mouth every 6 (six) hours as needed for muscle spasms.   montelukast (SINGULAIR) 10 MG tablet TAKE 1 TABLET BY MOUTH AT BEDTIME   ondansetron (ZOFRAN) 4 MG tablet TAKE 1 TABLET BY MOUTH EVERY 6 HOURS   OVER THE COUNTER MEDICATION daily at 6 (six) AM. brain essense   pantoprazole (PROTONIX) 40 MG tablet TAKE 1 TABLET BY MOUTH DAILY BEFORE BREAKFAST   polyethylene glycol-electrolytes (TRILYTE) 420 g solution Take 4,000 mLs by mouth as directed.   REPATHA SURECLICK 140 MG/ML SOAJ INJECT 1 ML into THE SKIN EVERY 14 DAYS   Specialty Vitamins Products (COLLAGEN ULTRA PO) Take 60 mg by mouth 1 day or 1 dose. Collagen peptides   UNABLE TO FIND 2 (two) times daily. Med Name: cinnamon, chromium & biotin   UNABLE TO FIND 1 day or 1 dose. Med Name: curamin 1/2 tablet for pain   ursodiol (ACTIGALL) 300 MG capsule TAKE ONE CAPSULE BY MOUTH THREE TIMES DAILY   Vitamin D, Ergocalciferol, (DRISDOL) 1.25 MG (50000 UNIT) CAPS capsule TAKE ONE CAPSULE BY MOUTH WEEKLY    [DISCONTINUED] docusate sodium (COLACE) 100 MG capsule Take 1 capsule (100 mg total) by mouth daily.   [DISCONTINUED] empagliflozin (JARDIANCE) 10 MG TABS tablet Take 1 tablet (10 mg total) by mouth daily before breakfast.   [DISCONTINUED] oxybutynin (DITROPAN XL) 15 MG 24 hr tablet    [DISCONTINUED] ramipril (ALTACE) 10 MG capsule Take 10 mg by mouth daily.   No facility-administered medications prior to visit.    Review of Systems  Constitutional:  Negative for chills, fever and malaise/fatigue.  HENT:  Negative for congestion and sinus pain.   Eyes:  Negative for pain, discharge and redness.  Respiratory:  Negative for cough, sputum production and shortness of breath.   Cardiovascular:  Negative for chest pain, palpitations, claudication and leg swelling.  Gastrointestinal:  Negative for diarrhea, heartburn and nausea.  Genitourinary:  Negative for flank pain and frequency.  Musculoskeletal:  Negative for back pain and joint pain.  Skin:  Negative for itching.  Neurological:  Negative for dizziness, seizures and headaches.  Endo/Heme/Allergies:  Negative for environmental allergies.  Psychiatric/Behavioral:  Negative for memory loss. The patient does not have insomnia.        Objective:    BP 127/78   Pulse 98   Ht 5\' 2"  (1.575 m)   Wt 133 lb 1.9 oz (60.4 kg)   SpO2 93%   BMI 24.35 kg/m  BP Readings from Last 3 Encounters:  06/15/23 127/78  05/28/23 130/64  04/09/23 (!) 165/86   Wt Readings from Last 3 Encounters:  06/15/23 133 lb 1.9 oz (60.4 kg)  05/28/23 132 lb (59.9 kg)  04/09/23 135 lb (61.2 kg)      Physical Exam Vitals reviewed: facial 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 with left sided weakness.  HENT:     Head: Normocephalic.     Left Ear: External ear normal.     Mouth/Throat:     Mouth: Mucous membranes are moist.  Eyes:     Extraocular Movements: Extraocular movements intact.     Pupils: Pupils are equal, round, and reactive to light.  Cardiovascular:     Rate  and Rhythm: Normal rate and regular rhythm.     Heart sounds: No murmur heard. Pulmonary:     Effort: Pulmonary effort is normal.     Breath sounds: Normal breath sounds.  Abdominal:     Palpations: Abdomen is soft.     Tenderness: There is no right  CVA tenderness or left CVA tenderness.  Musculoskeletal:     Right lower leg: No edema.     Left lower leg: No edema.     Comments: Decreased left hand grip  Skin:    Findings: No lesion or rash.  Neurological:     Mental Status: She is alert and oriented to person, place, and time.     GCS: GCS eye subscore is 4. GCS verbal subscore is 5. GCS motor subscore is 6.     Cranial Nerves: No facial asymmetry.     Sensory: No sensory deficit.     Motor: No weakness.     Coordination: Coordination normal. Finger-Nose-Finger Test normal.     Gait: Gait normal.  Psychiatric:        Judgment: Judgment normal.     No results found for any visits on 06/15/23. Last CBC Lab Results  Component Value Date   WBC 4.2 06/08/2023   HGB 10.6 (L) 06/08/2023   HCT 35.4 (L) 06/08/2023   MCV 81.2 06/08/2023   MCH 24.3 (L) 06/08/2023   RDW 14.7 06/08/2023   PLT 257 06/08/2023   Last metabolic panel Lab Results  Component Value Date   GLUCOSE 119 (H) 06/08/2023   NA 137 06/08/2023   K 3.8 06/08/2023   CL 100 06/08/2023   CO2 27 06/08/2023   BUN 21 06/08/2023   CREATININE 0.60 06/08/2023   GFRNONAA >60 06/08/2023   CALCIUM 9.7 06/08/2023   PROT 6.5 10/24/2022   ALBUMIN 4.1 10/24/2022   LABGLOB 2.4 10/24/2022   AGRATIO 1.7 10/24/2022   BILITOT 0.3 10/24/2022   ALKPHOS 121 10/24/2022   AST 18 10/24/2022   ALT 11 10/24/2022   ANIONGAP 10 06/08/2023   Last lipids Lab Results  Component Value Date   CHOL 120 10/24/2022   HDL 63 10/24/2022   LDLCALC 43 10/24/2022   TRIG 66 10/24/2022   CHOLHDL 1.9 10/24/2022   Last hemoglobin A1c Lab Results  Component Value Date   HGBA1C 5.7 (H) 10/24/2022   Last thyroid functions Lab Results   Component Value Date   TSH 1.490 10/24/2022   T4TOTAL 10.0 03/30/2019   Last vitamin D Lab Results  Component Value Date   VD25OH 46.3 10/24/2022   Last vitamin B12 and Folate Lab Results  Component Value Date   VITAMINB12 642 11/14/2020   FOLATE 28.2 11/14/2020        Assessment & Plan:    Routine Health Maintenance and Physical Exam  Immunization History  Administered Date(s) Administered   Fluad Quad(high Dose 65+) 06/10/2022   Influenza Whole 06/08/2010   Influenza, High Dose Seasonal PF 06/17/2019   Influenza,inj,Quad PF,6+ Mos 07/07/2013, 07/06/2014, 07/17/2017   Influenza-Unspecified 08/05/2016   Moderna Covid-19 Vaccine Bivalent Booster 19yrs & up 06/06/2021   Moderna SARS-COV2 Booster Vaccination 07/24/2020   Moderna Sars-Covid-2 Vaccination 10/13/2019, 11/11/2019   Pneumococcal Conjugate-13 11/25/2013   Pneumococcal Polysaccharide-23 09/09/2007, 07/06/2014    Health Maintenance  Topic Date Due   OPHTHALMOLOGY EXAM  05/05/2015   Medicare Annual Wellness (AWV)  02/11/2023   HEMOGLOBIN A1C  04/24/2023   COVID-19 Vaccine (4 - 2023-24 season) 05/10/2023   Diabetic kidney evaluation - Urine ACR  06/14/2023   Zoster Vaccines- Shingrix (1 of 2) 09/15/2023 (Originally 01/14/1960)   INFLUENZA VACCINE  12/07/2023 (Originally 04/09/2023)   DTaP/Tdap/Td (1 - Tdap) 06/14/2024 (Originally 01/14/1960)   FOOT EXAM  01/27/2024   Diabetic kidney evaluation - eGFR measurement  06/07/2024   Pneumonia Vaccine 65+  Years old  Completed   DEXA SCAN  Completed   HPV VACCINES  Aged Out   Hepatitis C Screening  Discontinued    Discussed health benefits of physical activity, and encouraged her to engage in regular exercise appropriate for her age and condition.  Encounter for annual general medical examination with abnormal findings in adult Assessment & Plan: Physical exam as documented Encouraged to continue home exercises  Discussed heart-healthy diet  Encouraged to Exercise:  If you are able: 30 -60 minutes a day ,4 days a week, or 150 minutes a week. The longer the better. Combine stretch, strength, and aerobic activities Encourage to eat whole Food, Plant Predominant Nutrition is highly recommended: Eat Plenty of vegetables, Mushrooms, fruits, Legumes, Whole Grains, Nuts, seeds in lieu of processed meats, processed snacks/pastries red meat, poultry, eggs.  Will f/u in 1 year for CPE      Type 2 diabetes mellitus with hyperlipidemia (HCC) -     Microalbumin / creatinine urine ratio -     Hemoglobin A1c  Vitamin D deficiency -     VITAMIN D 25 Hydroxy (Vit-D Deficiency, Fractures)  Other specified hypothyroidism -     TSH + free T4  Hyperlipidemia associated with type 2 diabetes mellitus (HCC) -     Lipid panel -     CMP14+EGFR -     CBC with Differential/Platelet    Return in about 1 year (around 06/14/2024) for CPE.    Note: This chart has been completed using Engineer, civil (consulting) software, and while attempts have been made to ensure accuracy, certain words and phrases may not be transcribed as intended.   Gilmore Laroche, FNP

## 2023-06-15 NOTE — Patient Instructions (Addendum)
I appreciate the opportunity to provide care to you today!    Follow up:  4 months  Labs: please stop by the lab during the week to get your blood drawn (CBC, CMP, TSH, Lipid profile, HgA1c, Vit D)  Please schedule diabatic eye exam  Attached with your AVS, you will find valuable resources for self-education. I highly recommend dedicating some time to thoroughly examine them.   Please continue to a heart-healthy diet and increase your physical activities. Try to exercise for at least five days a week.    It was a pleasure to see you and I look forward to continuing to work together on your health and well-being. Please do not hesitate to call the office if you need care or have questions about your care.  In case of emergency, please visit the Emergency Department for urgent care, or contact our clinic at [phone] to schedule an appointment. We're here to help you!   Have a wonderful day and week. With Gratitude, Gilmore Laroche MSN, FNP-BC

## 2023-06-15 NOTE — Assessment & Plan Note (Addendum)
Physical exam as documented Encouraged to continue home exercises  Discussed heart-healthy diet  Encouraged to Exercise: If you are able: 30 -60 minutes a day ,4 days a week, or 150 minutes a week. The longer the better. Combine stretch, strength, and aerobic activities Encourage to eat whole Food, Plant Predominant Nutrition is highly recommended: Eat Plenty of vegetables, Mushrooms, fruits, Legumes, Whole Grains, Nuts, seeds in lieu of processed meats, processed snacks/pastries red meat, poultry, eggs.  Will f/u in 1 year for CPE

## 2023-06-29 ENCOUNTER — Other Ambulatory Visit: Payer: Self-pay | Admitting: Family Medicine

## 2023-07-20 ENCOUNTER — Other Ambulatory Visit: Payer: Self-pay

## 2023-07-20 DIAGNOSIS — M546 Pain in thoracic spine: Secondary | ICD-10-CM

## 2023-07-20 MED ORDER — CYCLOBENZAPRINE HCL 5 MG PO TABS: 5.0000 mg | ORAL_TABLET | Freq: Every day | ORAL | 1 refills | Status: DC

## 2023-08-05 ENCOUNTER — Other Ambulatory Visit: Payer: Self-pay | Admitting: Family Medicine

## 2023-08-05 ENCOUNTER — Ambulatory Visit: Payer: Self-pay | Admitting: Family Medicine

## 2023-08-05 DIAGNOSIS — B9689 Other specified bacterial agents as the cause of diseases classified elsewhere: Secondary | ICD-10-CM

## 2023-08-05 MED ORDER — PROMETHAZINE-DM 6.25-15 MG/5ML PO SYRP
5.0000 mL | ORAL_SOLUTION | Freq: Four times a day (QID) | ORAL | 0 refills | Status: DC | PRN
Start: 2023-08-05 — End: 2023-10-04

## 2023-08-05 MED ORDER — DOXYCYCLINE HYCLATE 100 MG PO TABS
100.0000 mg | ORAL_TABLET | Freq: Two times a day (BID) | ORAL | 0 refills | Status: AC
Start: 2023-08-05 — End: 2023-08-12

## 2023-08-05 NOTE — Telephone Encounter (Signed)
Left detailed vm °

## 2023-08-05 NOTE — Telephone Encounter (Signed)
Please inform the patient that I have sent a prescription for doxycycline and Promethazine DM. I encourage the patient to complete the full course of antibiotics and follow up if her symptoms are not relieved. I recommend taking Tylenol as needed for pain relief. Promethazine DM has been ordered for cough and congestion. Ensure adequate rest and increase her fluid intake to at least 64 ounces.

## 2023-08-05 NOTE — Telephone Encounter (Signed)
Copied from CRM [id number]. Topic: Clinical - Red Word Triage >> Aug 05, 2023  8:24 AM Prudencio Pair wrote: Red Word that prompted transfer to Nurse Triage: Pt states she is coughing & has a fever of 100.1. Also states she is having a lot of mucus as well. Advised pt that I would transfer her to the triage nurse and to hold & she disconnected the line. Has tried to call pt back three times and no answer, left a VM.  Chief Complaint: congestion, runny and stuffy nose, fever of 100.1 Symptoms: congestion, runny nose, stuffy nose, fever Frequency: started yesterday Pertinent Negatives: Patient denies sob. Disposition: [] ED /[] Urgent Care (no appt availability in office) / [x] Appointment(In office/virtual)/ []  [place] [facility]/ [] Home Care/ [] Refused Recommended Disposition /[] [place] [facility]/ []  Follow-up with PCP Additional Notes: patient called c/o cold like symptoms with fever.  States started yesterday with runny stuffy nose, cough with green/yellow sputum, fever of 100.1.  Attempted to make appt. For patient to be seen today; no appt. Available.  Message sent to office.  Encouraged patient to go to [facility] if she becomes worse.  Patient states she does not like to go to UC/ED and will not go.   Reason for Disposition  [1] Fever returns after gone for over 24 hours AND [2] symptoms worse or not improved  Answer Assessment - Initial Assessment Questions 1. ONSET: "When did the nasal discharge start?"      yesterday 2. AMOUNT: "How much discharge is there?"      "Blew nose a few times yesterday" 3. COUGH: "Do you have a cough?" If Yes, ask: "Describe the color of your sputum" (clear, white, yellow, green)     Yes, green and yellow 4. RESPIRATORY DISTRESS: "Describe your breathing."      *No Answer* 5. FEVER: "Do you have a fever?" If Yes, ask: "What is your temperature, how was it measured, and when did it start?"     *No Answer* 6. SEVERITY: "Overall, how bad are you feeling right  now?" (e.g., doesn't interfere with normal activities, staying home from school/work, staying in bed)      *No Answer* 7. OTHER SYMPTOMS: "Do you have any other symptoms?" (e.g., sore throat, earache, wheezing, vomiting)     *No Answer* 8. PREGNANCY: "Is there any chance you are pregnant?" "When was your last menstrual period?"     *No Answer*  Protocols used: Common Cold-A-AH

## 2023-08-12 ENCOUNTER — Other Ambulatory Visit: Payer: Self-pay | Admitting: Family Medicine

## 2023-08-12 DIAGNOSIS — M546 Pain in thoracic spine: Secondary | ICD-10-CM

## 2023-08-17 ENCOUNTER — Telehealth: Payer: Self-pay | Admitting: Family Medicine

## 2023-08-17 NOTE — Telephone Encounter (Signed)
eror

## 2023-08-27 ENCOUNTER — Other Ambulatory Visit: Payer: Self-pay | Admitting: Family Medicine

## 2023-08-27 DIAGNOSIS — M546 Pain in thoracic spine: Secondary | ICD-10-CM

## 2023-08-28 ENCOUNTER — Other Ambulatory Visit: Payer: Self-pay | Admitting: Family Medicine

## 2023-09-03 ENCOUNTER — Other Ambulatory Visit: Payer: Self-pay | Admitting: Family Medicine

## 2023-09-03 DIAGNOSIS — B9689 Other specified bacterial agents as the cause of diseases classified elsewhere: Secondary | ICD-10-CM

## 2023-09-15 ENCOUNTER — Emergency Department (HOSPITAL_COMMUNITY)
Admission: EM | Admit: 2023-09-15 | Discharge: 2023-09-15 | Disposition: A | Discharge status: Home / Self Care | Payer: Medicare Other | Attending: Emergency Medicine | Admitting: Emergency Medicine

## 2023-09-15 ENCOUNTER — Emergency Department (HOSPITAL_COMMUNITY): Payer: Medicare Other

## 2023-09-15 ENCOUNTER — Ambulatory Visit: Payer: Self-pay | Admitting: Family Medicine

## 2023-09-15 ENCOUNTER — Encounter (HOSPITAL_COMMUNITY): Payer: Self-pay

## 2023-09-15 DIAGNOSIS — Z23 Encounter for immunization: Secondary | ICD-10-CM | POA: Diagnosis not present

## 2023-09-15 DIAGNOSIS — W19XXXA Unspecified fall, initial encounter: Secondary | ICD-10-CM | POA: Diagnosis not present

## 2023-09-15 DIAGNOSIS — I10 Essential (primary) hypertension: Secondary | ICD-10-CM | POA: Insufficient documentation

## 2023-09-15 DIAGNOSIS — Z91040 Latex allergy status: Secondary | ICD-10-CM | POA: Diagnosis not present

## 2023-09-15 DIAGNOSIS — E119 Type 2 diabetes mellitus without complications: Secondary | ICD-10-CM | POA: Diagnosis not present

## 2023-09-15 DIAGNOSIS — S0181XA Laceration without foreign body of other part of head, initial encounter: Secondary | ICD-10-CM | POA: Insufficient documentation

## 2023-09-15 DIAGNOSIS — Z79899 Other long term (current) drug therapy: Secondary | ICD-10-CM | POA: Insufficient documentation

## 2023-09-15 DIAGNOSIS — S0993XA Unspecified injury of face, initial encounter: Secondary | ICD-10-CM | POA: Diagnosis present

## 2023-09-15 MED ORDER — LIDOCAINE HCL (PF) 1 % IJ SOLN
15.0000 mL | Freq: Once | INTRAMUSCULAR | Status: AC
  Administered 2023-09-15: 15 mL
  Filled 2023-09-15: qty 15

## 2023-09-15 MED ORDER — TETANUS-DIPHTH-ACELL PERTUSSIS 5-2.5-18.5 LF-MCG/0.5 IM SUSY
0.5000 mL | PREFILLED_SYRINGE | Freq: Once | INTRAMUSCULAR | Status: AC
  Administered 2023-09-15: 0.5 mL via INTRAMUSCULAR
  Filled 2023-09-15: qty 0.5

## 2023-09-15 NOTE — ED Notes (Signed)
 Patient transported to CT

## 2023-09-15 NOTE — ED Provider Notes (Signed)
 [place] EMERGENCY DEPARTMENT AT [facility] Provider Note   CSN: [id number] Arrival date & time: 09/15/23  1030     History  Chief Complaint  Patient presents with   Fall   Head Laceration    Elizabeth [name] is a 83 y.o. female with history of CVA, hypertension, diabetes, presents with concern for a fall that occurred approximately 1 hour prior to arrival.  States she was walking with her walker, and got her foot stuck in the walker and fell.  This was witnessed by her daughter at the bedside who also reports seeing this happen.  Patient denies any chest pain, shortness of breath, dizziness, palpitations before the fall.  She did hit her head, but denies any loss of consciousness.  No blood thinner use.  Currently denies any headaches, or pain elsewhere in her body.  According to daughter at bedside, patient is at baseline mental status.    Fall  Head Laceration       Home Medications Prior to Admission medications   Medication Sig Start Date End Date Taking? Authorizing Provider  albuterol  (VENTOLIN  HFA) 108 (90 Base) MCG/ACT inhaler INHALE TWO PUFFS BY MOUTH EVERY 6 HOURS AS NEEDED FOR wheezing OR SHORTNESS OF BREATH 04/27/23   Zarwolo, Gloria, FNP  amLODipine  (NORVASC ) 10 MG tablet TAKE 1 TABLET BY MOUTH EVERY DAY 10/14/22   Zarwolo, Gloria, FNP  azelastine  (ASTELIN ) 0.1 % nasal spray INSTILL 2 SPRAYS IN EACH NOSTRIL TWICE DAILY 08/27/23   Zarwolo, Gloria, FNP  chlorpheniramine (ALLERGY RELIEF) 4 MG tablet Take 4 mg by mouth 2 (two) times daily as needed for allergies.    [provider]  clopidogrel  (PLAVIX ) 75 MG tablet TAKE 1 TABLET BY MOUTH DAILY 08/28/23   Zarwolo, Gloria, FNP  cyclobenzaprine  (FLEXERIL ) 5 MG tablet TAKE 1 TABLET BY MOUTH AT BEDTIME 08/27/23   Zarwolo, Gloria, FNP  ezetimibe  (ZETIA ) 10 MG tablet TAKE 1 TABLET BY MOUTH DAILY 04/30/23   Zarwolo, Gloria, FNP  Grape Seed OIL by Does not apply route daily at 6 (six) AM.    [provider]  hydrochlorothiazide  (HYDRODIURIL ) 12.5 MG tablet Take 1 tablet (12.5 mg total) by mouth daily. 01/27/23   Zarwolo, Gloria, FNP  ibuprofen  (ADVIL ) 800 MG tablet TAKE 1 TABLET BY MOUTH EVERY 8 HOURS AS NEEDED 02/11/23   Margrette Taft BRAVO, MD  meclizine  (ANTIVERT ) 12.5 MG tablet TAKE 1 TABLET BY MOUTH THREE TIMES DAILY AS NEEDED FOR DIZZINESS 03/30/23   Zarwolo, Gloria, FNP  methocarbamol  (ROBAXIN ) 500 MG tablet Take 1 tablet (500 mg total) by mouth every 6 (six) hours as needed for muscle spasms. 08/19/22   Zarwolo, Gloria, FNP  montelukast  (SINGULAIR ) 10 MG tablet TAKE 1 TABLET BY MOUTH AT BEDTIME 06/29/23   Zarwolo, Gloria, FNP  ondansetron  (ZOFRAN ) 4 MG tablet TAKE 1 TABLET BY MOUTH EVERY 6 HOURS 12/17/22   Margrette Taft BRAVO, MD  OVER THE COUNTER MEDICATION daily at 6 (six) AM. brain essense    [provider]  pantoprazole  (PROTONIX ) 40 MG tablet TAKE 1 TABLET BY MOUTH DAILY BEFORE BREAKFAST 03/31/23   Carlan, Chelsea L, NP  polyethylene glycol-electrolytes (TRILYTE) 420 g solution Take 4,000 mLs by mouth as directed. 05/27/22   Eartha Angelia Sieving, MD  promethazine -dextromethorphan  (PROMETHAZINE -DM) 6.25-15 MG/5ML syrup Take 5 mLs by mouth 4 (four) times daily as needed. 08/05/23   Zarwolo, Gloria, FNP  REPATHA  SURECLICK 140 MG/ML SOAJ INJECT 1 ML into THE SKIN EVERY 14 DAYS 03/11/23  Alvan Dorn FALCON, MD  Specialty Vitamins Products (COLLAGEN ULTRA PO) Take 60 mg by mouth 1 day or 1 dose. Collagen peptides    [provider]  UNABLE TO FIND 2 (two) times daily. Med Name: cinnamon, chromium & biotin    [provider]  UNABLE TO FIND 1 day or 1 dose. Med Name: curamin 1/2 tablet for pain    [provider]  ursodiol  (ACTIGALL ) 300 MG capsule TAKE ONE CAPSULE BY MOUTH THREE TIMES DAILY 12/02/22   Carlan, Chelsea L, NP  Vitamin D , Ergocalciferol , (DRISDOL ) 1.25 MG (50000 UNIT) CAPS capsule TAKE ONE CAPSULE BY MOUTH WEEKLY 02/26/23   Zarwolo, Gloria, FNP       Allergies    Banana, Codeine, Stevia [stevioside], Actos [pioglitazone], Banana flavoring agent (non-screening), Benicar [olmesartan medoxomil], Benicar [olmesartan], Chantix [varenicline], Diovan [valsartan], Lipitor [atorvastatin ], Rice, Spinach, Statins, Vicodin [hydrocodone-acetaminophen ], Zocor [simvastatin], Ace inhibitors, Celebrex [celecoxib], Latex, Metformin  and related, Mushroom extract complex (do not select), Penicillins, and Sulfonamide derivatives    Review of Systems   Review of Systems  Neurological:  Negative for dizziness.    Physical Exam Updated Vital Signs BP (!) 152/80 (BP Location: Right Arm)   Pulse 87   Temp 98.4 F (36.9 C) (Oral)   Resp 18   Ht 5' 2 (1.575 m)   Wt 60.3 kg   SpO2 100%   BMI 24.33 kg/m  Physical Exam Vitals and nursing note reviewed.  Constitutional:      Appearance: Normal appearance.  HENT:     Head: Atraumatic.  Cardiovascular:     Rate and Rhythm: Normal rate and regular rhythm.  Pulmonary:     Effort: Pulmonary effort is normal.  Musculoskeletal:     Comments: No tenderness to palpation diffusely of the upper and lower extremities bilaterally  Full range of motion of the upper and lower extremities bilaterally  Skin:    Comments: 2 cm laceration to the left temple, no foreign debris, bleeding well-controlled  Neurological:     General: No focal deficit present.     Mental Status: She is alert.  Psychiatric:        Mood and Affect: Mood normal.        Behavior: Behavior normal.     ED Results / Procedures / Treatments   Labs (all labs ordered are listed, but only abnormal results are displayed) Labs Reviewed - No data to display  EKG None  Radiology CT Head Wo Contrast Result Date: 09/15/2023 CLINICAL DATA:  Head trauma, minor (Age >= 65y); Neck trauma (Age >= 65y). Fall. Forehead laceration. EXAM: CT HEAD WITHOUT CONTRAST CT CERVICAL SPINE WITHOUT CONTRAST TECHNIQUE: Multidetector CT imaging of the head and  cervical spine was performed following the standard protocol without intravenous contrast. Multiplanar CT image reconstructions of the cervical spine were also generated. RADIATION DOSE REDUCTION: This exam was performed according to the departmental dose-optimization program which includes automated exposure control, adjustment of the mA and/or kV according to patient size and/or use of iterative reconstruction technique. COMPARISON:  CTA head and neck 06/10/2021 FINDINGS: CT HEAD FINDINGS Brain: There is no evidence of an acute infarct, intracranial hemorrhage, mass, midline shift, or extra-axial fluid collection. Confluent hypodensities in the cerebral white matter may have mildly progressed and are nonspecific but compatible with severe chronic small vessel ischemic disease. A chronic lacunar infarct involving the right internal capsule/basal ganglia is new. A chronic pontine infarct is unchanged. There is mild cerebral atrophy. Vascular: Calcified atherosclerosis at the skull  base. 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 Skull: No acute fracture or suspicious osseous lesion. Sinuses/Orbits: Visualized paranasal sinuses are clear. New large right mastoid effusion. Right cataract extraction. Other: None. CT CERVICAL SPINE FINDINGS Alignment: Chronic straightening/slight reversal of the normal cervical lordosis and trace anterolisthesis of C3 on C4. Skull base and vertebrae: No acute fracture or suspicious osseous lesion. Prominent median C1-2 arthropathy. Soft tissues and spinal canal: No prevertebral fluid or swelling. No visible canal hematoma. Disc levels: Mild-to-moderate cervical disc degeneration and moderate facet arthrosis. No evidence of high-grade spinal canal stenosis. Upper chest: Clear lung apices. Other: Prominent atherosclerotic calcification of the carotid bifurcations. IMPRESSION: 1. No evidence of acute intracranial abnormality or cervical spine fracture. 2. Severe  chronic small vessel ischemic disease. Electronically Signed   By: Dasie Hamburg M.D.   On: 09/15/2023 11:46   CT Cervical Spine Wo Contrast Result Date: 09/15/2023 CLINICAL DATA:  Head trauma, minor (Age >= 65y); Neck trauma (Age >= 65y). Fall. Forehead laceration. EXAM: CT HEAD WITHOUT CONTRAST CT CERVICAL SPINE WITHOUT CONTRAST TECHNIQUE: Multidetector CT imaging of the head and cervical spine was performed following the standard protocol without intravenous contrast. Multiplanar CT image reconstructions of the cervical spine were also generated. RADIATION DOSE REDUCTION: This exam was performed according to the departmental dose-optimization program which includes automated exposure control, adjustment of the mA and/or kV according to patient size and/or use of iterative reconstruction technique. COMPARISON:  CTA head and neck 06/10/2021 FINDINGS: CT HEAD FINDINGS Brain: There is no evidence of an acute infarct, intracranial hemorrhage, mass, midline shift, or extra-axial fluid collection. Confluent hypodensities in the cerebral white matter may have mildly progressed and are nonspecific but compatible with severe chronic small vessel ischemic disease. A chronic lacunar infarct involving the right internal capsule/basal ganglia is new. A chronic pontine infarct is unchanged. There is mild cerebral atrophy. Vascular: Calcified atherosclerosis at the skull base. 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 Skull: No acute fracture or suspicious osseous lesion. Sinuses/Orbits: Visualized paranasal sinuses are clear. New large right mastoid effusion. Right cataract extraction. Other: None. CT CERVICAL SPINE FINDINGS Alignment: Chronic straightening/slight reversal of the normal cervical lordosis and trace anterolisthesis of C3 on C4. Skull base and vertebrae: No acute fracture or suspicious osseous lesion. Prominent median C1-2 arthropathy. Soft tissues and spinal canal: No prevertebral fluid  or swelling. No visible canal hematoma. Disc levels: Mild-to-moderate cervical disc degeneration and moderate facet arthrosis. No evidence of high-grade spinal canal stenosis. Upper chest: Clear lung apices. Other: Prominent atherosclerotic calcification of the carotid bifurcations. IMPRESSION: 1. No evidence of acute intracranial abnormality or cervical spine fracture. 2. Severe chronic small vessel ischemic disease. Electronically Signed   By: Dasie Hamburg M.D.   On: 09/15/2023 11:46    Procedures .Laceration Repair  Date/Time: 09/15/2023 12:26 PM  Performed by: Veta Palma, PA-C Authorized by: Veta Palma, PA-C   Consent:    Consent obtained:  Verbal   Consent given by:  Patient   Risks, benefits, and alternatives were discussed: yes     Risks discussed:  Infection, pain and poor cosmetic result Universal protocol:    Imaging studies available: yes     Patient identity confirmed:  Verbally with patient Anesthesia:    Anesthesia method:  Local infiltration   Local anesthetic:  Lidocaine  1% w/o epi (5ml) Laceration details:    Location:  Face   Facial location: left temple.   Length (cm):  2 Treatment:  Area cleansed with:  Povidone-iodine and soap and water    Irrigation solution:  Sterile saline   Debridement:  None   Undermining:  None Skin repair:    Repair method:  Sutures   Suture size:  5-0   Suture material:  Prolene   Suture technique:  Simple interrupted   Number of sutures:  5 Repair type:    Repair type:  Simple Post-procedure details:    Procedure completion:  Tolerated well, no immediate complications     Medications Ordered in ED Medications  lidocaine  (PF) (XYLOCAINE ) 1 % injection 15 mL (15 mLs Infiltration Given by Other 09/15/23 1151)  Tdap (BOOSTRIX ) injection 0.5 mL (0.5 mLs Intramuscular Given 09/15/23 1148)    ED Course/ Medical Decision Making/ A&P                                 Medical Decision Making Amount and/or Complexity of  Data Reviewed Labs: ordered. Radiology: ordered.  Risk Prescription drug management.     Differential diagnosis includes but is not limited to mechanical fall, ACS, arrhythmia, laceration, intracranial manage, fracture  ED Course:  Patient well-appearing, no acute distress.  Patient denies any pain.  No tenderness palpation of the upper or lower extremities bilaterally. No concern for fracture or dislocation at this time.  According to daughter at bedside, at baseline mental status.  Story fits with mechanical fall, at baseline, no indication for further workup at this time.  CT head and cervical spine were obtained which showed no acute abnormalities.  Laceration was closed with 5 simple interrupted sutures.  Tetanus updated.  Patient tolerated this well.  Stable and appropriate for discharge home  Impression: Laceration to left temple Mechanical fall  Disposition:  The patient was discharged home with instructions to follow-up with PCP in 5 days to have sutures removed.  Discussed wound care at home with patient. Return precautions given.              Final Clinical Impression(s) / ED Diagnoses Final diagnoses:  Fall, initial encounter  Facial laceration, initial encounter    Rx / DC Orders ED Discharge Orders     None         Veta Palma, DEVONNA 09/15/23 1231    Cleotilde Rogue, MD 09/15/23 [phone]

## 2023-09-15 NOTE — ED Triage Notes (Signed)
 Pt c/o laceration to L forehead following a slip and fall this morning.  Pain score 3/10.  Denies blood thinners.    Pt reports her walked slipped causing the fall.  Pt is at neuro baseline.

## 2023-09-15 NOTE — Telephone Encounter (Signed)
 Copied from CRM [id number]. Topic: Appointments - Appointment Scheduling >> Sep 15, 2023  3:38 PM Rodell L wrote: Patient/patient representative is calling to schedule an appointment. Refer to attachments for appointment information.  Patient called wanting to make apt. With PCP for suture removal.  Please call patient back to let her know if you can squeeze her in on 09/20/23 or if she should go to the ER for removal.  Reason for Disposition  Nursing judgment or information in reference  Answer Assessment - Initial Assessment Questions 1. REASON FOR CALL: What is your main concern right now?     Called to make follow up apt with office to have stitches removed.  Protocols used: No Guideline Available-A-AH

## 2023-09-15 NOTE — ED Notes (Signed)
 Suture cart at bedside

## 2023-09-15 NOTE — Discharge Instructions (Addendum)
 He had 5 sutures placed today. You must get your sutures removed on 09/21/23.  We recommend visiting your PCP or an urgent care for suture removal. However, you may also return back to the ER if you are unable to be seen by your PCP or at urgent care.   You may gently clean the area around your laceration as needed with soap and water . Place antibiotic ointment such as bacitracin or neosporin over your laceration after cleaning the area.  Keep the laceration covered with sterile gauze as shown here if you are doing an activity in which it may get dirty. You may pick these supplies up at any drugstore.  Do not submerge your laceration in water  (no baths, swimming) until it is fully healed. You may shower.   Your tetanus was updated today.  The CT of your head and neck does not show any abnormalities.  Return to the ER should you develop fever, chills, pus drainage from your wound, redness around your wound.

## 2023-09-16 NOTE — Telephone Encounter (Signed)
 LVM to call and schedule appt

## 2023-09-21 ENCOUNTER — Ambulatory Visit: Payer: Medicare Other | Admitting: Family Medicine

## 2023-09-22 ENCOUNTER — Other Ambulatory Visit: Payer: Self-pay | Admitting: Family Medicine

## 2023-09-22 DIAGNOSIS — J441 Chronic obstructive pulmonary disease with (acute) exacerbation: Secondary | ICD-10-CM

## 2023-09-25 ENCOUNTER — Other Ambulatory Visit: Payer: Self-pay | Admitting: Family Medicine

## 2023-09-25 DIAGNOSIS — E559 Vitamin D deficiency, unspecified: Secondary | ICD-10-CM

## 2023-09-27 ENCOUNTER — Other Ambulatory Visit (INDEPENDENT_AMBULATORY_CARE_PROVIDER_SITE_OTHER): Payer: Self-pay | Admitting: Gastroenterology

## 2023-10-02 ENCOUNTER — Ambulatory Visit: Payer: Self-pay | Admitting: Family Medicine

## 2023-10-02 NOTE — Telephone Encounter (Signed)
Copied from CRM [id number]. Topic: Clinical - Red Word Triage >> Oct 02, 2023 11:07 AM Thomes Dinning wrote: Red Word that prompted transfer to Nurse Triage: Patient woke up 2 night a go feeling sick. She states she's had a fever, her blood pressure was 124/90  and her blood sugar is 109. She has a cough and sore throat.   Chief Complaint: Sore throat  Symptoms: Sore throat, hoarse voice, cough, sneezing, headache  Frequency: Constant  Pertinent Negatives: Patient denies fever, difficulty breathing  Disposition: [] ED /[] Urgent Care (no appt availability in office) / [] Appointment(In office/virtual)/ []  [place] [facility]/ [x] Home Care/ [] Refused Recommended Disposition /[]  Mobile Bus/ []  Follow-up with PCP Additional Notes: Patient reports that yesterday she began to have a sore throat that she describes as laryngitis. Patient states is experiencing a sore throat, hoarse voice, cough, headache, and sneezing. Patient denies any fevers or shortness of breath. Patient would like a prescription for her symptoms but states she is not able to get into the office for a visit and does not have MyChart for a virtual visit. Advised patient I would sent the request to the office and she asked someone call her back with an update. Patient advised to call back for new or worsening symptoms.     Reason for Disposition  [1] Sore throat with cough/cold symptoms AND [2] present < 5 days  Answer Assessment - Initial Assessment Questions 1. ONSET: "When did the throat start hurting?" (Hours or days ago)      Yesterday 2. SEVERITY: "How bad is the sore throat?" (Scale 1-10; mild, moderate or severe)   - MILD (1-3):  Doesn't interfere with eating or normal activities.   - MODERATE (4-7): Interferes with eating some solids and normal activities.   - SEVERE (8-10):  Excruciating pain, interferes with most normal activities.   - SEVERE WITH DYSPHAGIA (10): Can't swallow liquids, drooling.     Difficult to  talk due to pain  3. STREP EXPOSURE: "Has there been any exposure to strep within the past week?" If Yes, ask: "What type of contact occurred?"      No 4.  VIRAL SYMPTOMS: "Are there any symptoms of a cold, such as a runny nose, cough, hoarse voice or red eyes?"      Cough with gray mucus, hoarse voice, sneezing  5. FEVER: "Do you have a fever?" If Yes, ask: "What is your temperature, how was it measured, and when did it start?"     97.9 F 6. PUS ON THE TONSILS: "Is there pus on the tonsils in the back of your throat?"     Has not looked  7. OTHER SYMPTOMS: "Do you have any other symptoms?" (e.g., difficulty breathing, headache, rash)     Headache  8. PREGNANCY: "Is there any chance you are pregnant?" "When was your last menstrual period?"     No  Protocols used: Sore Throat-A-AH

## 2023-10-04 ENCOUNTER — Other Ambulatory Visit: Payer: Self-pay | Admitting: Family Medicine

## 2023-10-04 DIAGNOSIS — B9689 Other specified bacterial agents as the cause of diseases classified elsewhere: Secondary | ICD-10-CM

## 2023-10-04 MED ORDER — PROMETHAZINE-DM 6.25-15 MG/5ML PO SYRP
5.0000 mL | ORAL_SOLUTION | Freq: Four times a day (QID) | ORAL | 0 refills | Status: DC | PRN
Start: 2023-10-04 — End: 2023-10-19

## 2023-10-04 NOTE — Telephone Encounter (Signed)
Rx for Promethazine DM  to take 5 mL by mouth every 4 hours as needed for cough and cold symptoms has been sent Increase fluid intake and allow for plenty of rest. Take Tylenol as needed for pain, fever, or general discomfort. Perform warm saltwater gargles 3-4 times daily to help with throat pain or discomfort. (Mix 1/2 teaspoon of salt in a glass of warm water and gargle several times daily to reduce throat inflammation and soothe irritation.) Ginger tea: Reduces throat irritation and can help with nausea. Look for sugar-free or honey-based throat lozenges to help with a sore throat.

## 2023-10-05 NOTE — Telephone Encounter (Signed)
Instruction have been sent thru my chart for compliance.

## 2023-10-08 ENCOUNTER — Other Ambulatory Visit (INDEPENDENT_AMBULATORY_CARE_PROVIDER_SITE_OTHER): Payer: Self-pay | Admitting: Gastroenterology

## 2023-10-09 ENCOUNTER — Other Ambulatory Visit (INDEPENDENT_AMBULATORY_CARE_PROVIDER_SITE_OTHER): Payer: Self-pay | Admitting: Gastroenterology

## 2023-10-12 NOTE — Telephone Encounter (Signed)
 Needs office visit.

## 2023-10-19 ENCOUNTER — Ambulatory Visit: Payer: Medicare Other | Admitting: Family Medicine

## 2023-10-19 ENCOUNTER — Encounter: Payer: Self-pay | Admitting: Family Medicine

## 2023-10-19 VITALS — BP 129/60 | HR 97 | Ht 62.0 in | Wt 140.0 lb

## 2023-10-19 DIAGNOSIS — I6930 Unspecified sequelae of cerebral infarction: Secondary | ICD-10-CM

## 2023-10-19 DIAGNOSIS — I152 Hypertension secondary to endocrine disorders: Secondary | ICD-10-CM | POA: Diagnosis not present

## 2023-10-19 DIAGNOSIS — Z7984 Long term (current) use of oral hypoglycemic drugs: Secondary | ICD-10-CM

## 2023-10-19 DIAGNOSIS — I6330 Cerebral infarction due to thrombosis of unspecified cerebral artery: Secondary | ICD-10-CM

## 2023-10-19 DIAGNOSIS — E1159 Type 2 diabetes mellitus with other circulatory complications: Secondary | ICD-10-CM | POA: Diagnosis not present

## 2023-10-19 DIAGNOSIS — E559 Vitamin D deficiency, unspecified: Secondary | ICD-10-CM

## 2023-10-19 DIAGNOSIS — E038 Other specified hypothyroidism: Secondary | ICD-10-CM

## 2023-10-19 DIAGNOSIS — K219 Gastro-esophageal reflux disease without esophagitis: Secondary | ICD-10-CM

## 2023-10-19 DIAGNOSIS — E1169 Type 2 diabetes mellitus with other specified complication: Secondary | ICD-10-CM

## 2023-10-19 DIAGNOSIS — R110 Nausea: Secondary | ICD-10-CM

## 2023-10-19 DIAGNOSIS — E785 Hyperlipidemia, unspecified: Secondary | ICD-10-CM

## 2023-10-19 DIAGNOSIS — R058 Other specified cough: Secondary | ICD-10-CM

## 2023-10-19 MED ORDER — PANTOPRAZOLE SODIUM 40 MG PO TBEC
40.0000 mg | DELAYED_RELEASE_TABLET | Freq: Every day | ORAL | 1 refills | Status: DC
Start: 2023-10-19 — End: 2024-03-24

## 2023-10-19 MED ORDER — PROMETHAZINE-DM 6.25-15 MG/5ML PO SYRP: 5.0000 mL | ORAL_SOLUTION | Freq: Four times a day (QID) | ORAL | 0 refills | Status: DC | PRN

## 2023-10-19 MED ORDER — ONDANSETRON HCL 4 MG PO TABS: 4.0000 mg | ORAL_TABLET | Freq: Four times a day (QID) | ORAL | 1 refills | Status: AC

## 2023-10-19 MED ORDER — CLOPIDOGREL BISULFATE 75 MG PO TABS
75.0000 mg | ORAL_TABLET | Freq: Every day | ORAL | 1 refills | Status: DC
Start: 2023-10-19 — End: 2024-08-02

## 2023-10-19 NOTE — Assessment & Plan Note (Signed)
 The patient's blood pressure is controlled in the clinic, and she reports compliance with hydrochlorothiazide  12.5 mg and amlodipine  10 mg daily. A low-sodium diet of less than 2,300 mg daily is recommended, along with moderate-intensity physical activity, aiming for 150 minutes per week. The patient is encouraged to continue these lifestyle modifications to help manage her blood pressure effectively.  BP Readings from Last 3 Encounters:  10/19/23 129/60  09/15/23 133/67  06/15/23 127/78

## 2023-10-19 NOTE — Progress Notes (Signed)
 Established Patient Office Visit  Subjective:  Patient ID: Elizabeth [name], female    DOB: 1940-11-15  Age: 83 y.o. MRN: [id number]  CC:  Chief Complaint  Patient presents with   Follow-up    4 month    Hospitalization Follow-up    Fall w/ injury on 09/15/23    HPI Elizabeth [name] is a 83 y.o. female with past medical history of hypertension, type II diabetes, hyperlipidemia presents for f/u of  chronic medical conditions. For the details of today's visit, please refer to the assessment and plan.     Past Medical History:  Diagnosis Date   Absolute anemia 07/28/2018   Active smoker    Allergy    Anxiety    Takes Xanax  for anxiety   Arthritis    Asthma    Cataract    Chronic airway obstruction, not elsewhere classified    Clotting disorder (HCC)    stoke    Depression    Diarrhea    Diverticulosis of colon (without mention of hemorrhage)    Esophageal reflux    Family history of malignant neoplasm of gastrointestinal tract    GI bleed    Headache(784.0)    Irregular   Neuromuscular disorder (HCC)    numbness is left hand and left elbow   Obesity, unspecified    Other and unspecified hyperlipidemia    Personal history of colonic polyps    Stricture and stenosis of esophagus    Stroke (HCC)    Thyroid  disease    Type II or unspecified type diabetes mellitus without mention of complication, not stated as uncontrolled    Unspecified asthma(493.90)    Unspecified essential hypertension     Past Surgical History:  Procedure Laterality Date   APPENDECTOMY     BACK SURGERY     Spinal surgery   BRAIN SURGERY     2013 - stoke ( fall in 2010)    CARDIAC CATHETERIZATION  2006   LAD: 30%, RCA : 20%, normal EF, elevated LVEDP   CARDIAC CATHETERIZATION     CEREBRAL ANGIOGRAM  12/20/2015   COLONOSCOPY N/A 08/09/2018   Procedure: COLONOSCOPY;  Surgeon: Ruby Corporal, MD;  [facility];  Service: Endoscopy;  Laterality: N/A;  1:55   ESOPHAGEAL DILATION N/A  08/09/2018   Procedure: ESOPHAGEAL DILATION;  Surgeon: Ruby Corporal, MD;  [facility];  Service: Endoscopy;  Laterality: N/A;   ESOPHAGOGASTRODUODENOSCOPY N/A 08/09/2018   Procedure: ESOPHAGOGASTRODUODENOSCOPY (EGD);  Surgeon: Ruby Corporal, MD;  [facility];  Service: Endoscopy;  Laterality: N/A;   GIVENS CAPSULE STUDY N/A 08/25/2018   Procedure: GIVENS CAPSULE STUDY;  Surgeon: Ruby Corporal, MD;  [facility];  Service: Endoscopy;  Laterality: N/A;   IR ANGIO INTRA EXTRACRAN SEL COM CAROTID INNOMINATE BILAT MOD SED  01/14/2018   IR ANGIO INTRA EXTRACRAN SEL COM CAROTID INNOMINATE BILAT MOD SED  05/11/2018   IR ANGIO VERTEBRAL SEL SUBCLAVIAN INNOMINATE UNI L MOD SED  01/14/2018   IR ANGIO VERTEBRAL SEL SUBCLAVIAN INNOMINATE UNI L MOD SED  05/11/2018   IR ANGIO VERTEBRAL SEL VERTEBRAL UNI R MOD SED  01/14/2018   IR ANGIO VERTEBRAL SEL VERTEBRAL UNI R MOD SED  05/11/2018   KNEE ARTHROSCOPY     left   LUMBAR DISC SURGERY     POLYPECTOMY  08/09/2018   Procedure: POLYPECTOMY;  Surgeon: Ruby Corporal, MD;  [facility];  Service: Endoscopy;;  ascending  colon (CS x 2, HS x5) hepatic flexure (HSx1), recto-sigmoid (HSx1)   TONSILLECTOMY     TOTAL ABDOMINAL HYSTERECTOMY     menopause/bleeding    Family History  Problem Relation Age of Onset   Diabetes Mother    Heart disease Mother    Asthma Mother    Kidney disease Mother    Drug abuse Mother    Hypertension Mother    Stroke Mother        fall -several bleed on brain 59   Early death Father        trauma   Colon cancer Other        first cousin, paternal aunt and uncle   Cancer Paternal Grandmother        gastric   Diabetes Paternal Grandmother    Mental illness Paternal Grandmother    Heart disease Maternal Grandmother    Thyroid  cancer Other        aunt   Heart disease Sister    Mental illness Sister    Heart attack Sister    Heart disease Brother    Lung disease Brother     Cancer Sister        pancreatic   Diabetes Sister    Hypertension Sister    Tuberculosis Maternal Grandfather    Heart disease Paternal Grandfather     Social History   Socioeconomic History   Marital status: Married    Spouse name: Porfirio Bristol   Number of children: 0   Years of education: 16   Highest education level: Bachelor's degree (e.g., BA, AB, BS)  Occupational History   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   Tobacco Use   Smoking status: Every Day    Types: E-cigarettes    Passive exposure: Current   Smokeless tobacco: Never  Vaping Use   Vaping status: Every Day   Substances: THC, CBD, Flavoring, Nicotine -salt, Synthetic cannabinoids, Mixture of cannabinoids, Homemade substance  Substance and Sexual Activity   Alcohol  use: No    Alcohol /week: 0.0 standard drinks of alcohol    Drug use: No   Sexual activity: Not Currently  Other Topics Concern   Not on file  Social History Narrative   Married Porfirio Bristol   Limited activity due to back pain   Social Drivers of Health   Financial Resource Strain: Low Risk  (01/28/2021)   Overall Financial Resource Strain (CARDIA)    Difficulty of Paying Living Expenses: Not hard at all  Food Insecurity: No Food Insecurity (01/28/2021)   Hunger Vital Sign    Worried About Running Out of Food in the Last Year: Never true    Ran Out of Food in the Last Year: Never true  Transportation Needs: No Transportation Needs (01/28/2021)   PRAPARE - Administrator, Civil Service (Medical): No    Lack of Transportation (Non-Medical): No  Physical Activity: Insufficiently Active (01/28/2021)   Exercise Vital Sign    Days of Exercise per Week: 3 days    Minutes of Exercise per Session: 20 min  Stress: No Stress Concern Present (01/28/2021)   [employer] of Occupational Health - Occupational Stress Questionnaire    Feeling of Stress : Not at all  Social Connections: Unknown (01/06/2022)   Received from [facility], [facility]   Social Network    Social Network: Not on file  Intimate Partner Violence: Unknown (12/10/2021)   Received from [facility], [facility]   HITS  Physically Hurt: Not on file    Insult or Talk Down To: Not on file    Threaten Physical Harm: Not on file    Scream or Curse: Not on file    Outpatient Medications Prior to Visit  Medication Sig Dispense Refill   albuterol  (VENTOLIN  HFA) 108 (90 Base) MCG/ACT inhaler INHALE TWO PUFFS BY MOUTH EVERY 6 HOURS AS NEEDED FOR wheezing OR SHORTNESS OF BREATH 8.5 g 2   amLODipine  (NORVASC ) 10 MG tablet TAKE 1 TABLET BY MOUTH EVERY DAY 90 tablet 5   azelastine  (ASTELIN ) 0.1 % nasal spray INSTILL 2 SPRAYS IN EACH NOSTRIL TWICE DAILY 30 mL 2   chlorpheniramine (ALLERGY RELIEF) 4 MG tablet Take 4 mg by mouth 2 (two) times daily as needed for allergies.     cyclobenzaprine  (FLEXERIL ) 5 MG tablet TAKE 1 TABLET BY MOUTH AT BEDTIME 30 tablet 1   ezetimibe  (ZETIA ) 10 MG tablet TAKE 1 TABLET BY MOUTH DAILY 90 tablet 3   Grape Seed OIL by Does not apply route daily at 6 (six) AM.     hydrochlorothiazide  (HYDRODIURIL ) 12.5 MG tablet Take 1 tablet (12.5 mg total) by mouth daily. 90 tablet 3   ibuprofen  (ADVIL ) 800 MG tablet TAKE 1 TABLET BY MOUTH EVERY 8 HOURS AS NEEDED 90 tablet 1   meclizine  (ANTIVERT ) 12.5 MG tablet TAKE 1 TABLET BY MOUTH THREE TIMES DAILY AS NEEDED FOR DIZZINESS 15 tablet 0   methocarbamol  (ROBAXIN ) 500 MG tablet Take 1 tablet (500 mg total) by mouth every 6 (six) hours as needed for muscle spasms. 30 tablet 0   montelukast  (SINGULAIR ) 10 MG tablet TAKE 1 TABLET BY MOUTH AT BEDTIME 30 tablet 3   OVER THE COUNTER MEDICATION daily at 6 (six) AM. brain essense     polyethylene glycol-electrolytes (TRILYTE) 420 g solution Take 4,000 mLs by mouth as directed. 4000 mL 0   REPATHA  SURECLICK 140 MG/ML SOAJ INJECT 1 ML into THE SKIN EVERY 14 DAYS 6 mL 3   Specialty Vitamins Products (COLLAGEN ULTRA PO) Take 60 mg by mouth 1  day or 1 dose. Collagen peptides     UNABLE TO FIND 2 (two) times daily. Med Name: cinnamon, chromium & biotin     UNABLE TO FIND 1 day or 1 dose. Med Name: curamin 1/2 tablet for pain     ursodiol  (ACTIGALL ) 300 MG capsule TAKE ONE CAPSULE BY MOUTH THREE TIMES DAILY 270 capsule 3   Vitamin D , Ergocalciferol , (DRISDOL ) 1.25 MG (50000 UNIT) CAPS capsule TAKE ONE CAPSULE BY MOUTH EVERY 7 DAYS 10 capsule 2   clopidogrel  (PLAVIX ) 75 MG tablet TAKE 1 TABLET BY MOUTH DAILY 90 tablet 1   ondansetron  (ZOFRAN ) 4 MG tablet TAKE 1 TABLET BY MOUTH EVERY 6 HOURS 60 tablet 1   pantoprazole  (PROTONIX ) 40 MG tablet TAKE 1 TABLET BY MOUTH DAILY BEFORE BREAKFAST 90 tablet 1   promethazine -dextromethorphan  (PROMETHAZINE -DM) 6.25-15 MG/5ML syrup Take 5 mLs by mouth 4 (four) times daily as needed. 118 mL 0   No facility-administered medications prior to visit.    Allergies  Allergen Reactions   Banana Anaphylaxis   Codeine Nausea And Vomiting and Other (See Comments)    PROJECTILE VOMITING   Stevia [Stevioside] Swelling and Other (See Comments)    Face, tongue, and eye swelling    Actos [Pioglitazone] Swelling   Banana Flavoring Agent (Non-Screening)    Benicar [Olmesartan Medoxomil] Other (See Comments)    Feel bad   Benicar [Olmesartan]    Chantix [  Varenicline] Other (See Comments)    "Worked opposite."   Diovan [Valsartan] Other (See Comments)    Feel bad   Lipitor [Atorvastatin ] Other (See Comments)    REACTION:  Joint pain   Rice Other (See Comments)    Gives patient indigestion   Spinach Other (See Comments)    Stomach problems   Statins Other (See Comments)    Feels bad, leg cramps, worries about liver effects.   Vicodin [Hydrocodone-Acetaminophen ] Nausea And Vomiting   Zocor [Simvastatin] Other (See Comments)    REACTION:  "made liver function incorrectly"   Ace Inhibitors Cough   Celebrex [Celecoxib] Rash   Latex Rash and Other (See Comments)    REACTION: red rash   Metformin  And  Related     GI - upset    Mushroom Extract Complex (Obsolete) Palpitations   Penicillins Rash and Other (See Comments)    Has patient had a PCN reaction causing immediate rash, facial/tongue/throat swelling, SOB or lightheadedness with hypotension: No Has patient had a PCN reaction causing severe rash involving mucus membranes or skin necrosis: No Has patient had a PCN reaction that required hospitalization: No- MD office Has patient had a PCN reaction occurring within the last 10 years: No If all of the above answers are "NO", then may proceed with Cephalosporin use.    Sulfonamide Derivatives Rash    ROS Review of Systems  Constitutional:  Negative for chills and fever.  Eyes:  Negative for visual disturbance.  Respiratory:  Negative for chest tightness and shortness of breath.   Neurological:  Negative for dizziness and headaches.      Objective:    Physical Exam HENT:     Head: Normocephalic.     Mouth/Throat:     Mouth: Mucous membranes are moist.  Cardiovascular:     Rate and Rhythm: Normal rate.     Heart sounds: Normal heart sounds.  Pulmonary:     Effort: Pulmonary effort is normal.     Breath sounds: Normal breath sounds.  Neurological:     Mental Status: She is alert.     BP 129/60   Pulse 97   Ht 5\' 2"  (1.575 m)   Wt 140 lb (63.5 kg)   SpO2 97%   BMI 25.61 kg/m  Wt Readings from Last 3 Encounters:  10/19/23 140 lb (63.5 kg)  09/15/23 133 lb (60.3 kg)  06/15/23 133 lb 1.9 oz (60.4 kg)    Lab Results  Component Value Date   TSH 1.490 10/24/2022   Lab Results  Component Value Date   WBC 4.2 06/08/2023   HGB 10.6 (L) 06/08/2023   HCT 35.4 (L) 06/08/2023   MCV 81.2 06/08/2023   PLT 257 06/08/2023   Lab Results  Component Value Date   NA 137 06/08/2023   K 3.8 06/08/2023   CO2 27 06/08/2023   GLUCOSE 119 (H) 06/08/2023   BUN 21 06/08/2023   CREATININE 0.60 06/08/2023   BILITOT 0.3 10/24/2022   ALKPHOS 121 10/24/2022   AST 18 10/24/2022    ALT 11 10/24/2022   PROT 6.5 10/24/2022   ALBUMIN 4.1 10/24/2022   CALCIUM  9.7 06/08/2023   ANIONGAP 10 06/08/2023   EGFR 90 02/09/2023   Lab Results  Component Value Date   CHOL 120 10/24/2022   Lab Results  Component Value Date   HDL 63 10/24/2022   Lab Results  Component Value Date   LDLCALC 43 10/24/2022   Lab Results  Component Value Date  TRIG 66 10/24/2022   Lab Results  Component Value Date   CHOLHDL 1.9 10/24/2022   Lab Results  Component Value Date   HGBA1C 5.7 (H) 10/24/2022      Assessment & Plan:  Hypertension associated with diabetes (HCC) Assessment & Plan: The patient's blood pressure is controlled in the clinic, and she reports compliance with hydrochlorothiazide  12.5 mg and amlodipine  10 mg daily. A low-sodium diet of less than 2,300 mg daily is recommended, along with moderate-intensity physical activity, aiming for 150 minutes per week. The patient is encouraged to continue these lifestyle modifications to help manage her blood pressure effectively.  BP Readings from Last 3 Encounters:  10/19/23 129/60  09/15/23 133/67  06/15/23 127/78      Type 2 diabetes mellitus with hyperlipidemia [facility]) Assessment & Plan: The patient reports compliance with Jardiance  10 mg daily. She is encouraged to continue her treatment regimen and to implement lifestyle changes by reducing her intake of high-sugar foods and beverages and increasing physical activity.    Orders: -     Hemoglobin A1c  Hyperlipidemia associated with type 2 diabetes mellitus (HCC) Assessment & Plan: The patient reports that Repatha  140 mg every two weeks now costs $557 out-of-pocket, and they were unable to pick up the prescription due to the high cost. A referral will be placed to the pharmacy for medication management. The patient has been compliant with Ezetimibe  10 mg daily. Lifestyle changes were discussed, including reducing the intake of greasy, fatty, and starchy foods  and incorporating physical activity as tolerated. The patient and her husband express understanding.   Orders: -     Lipid panel -     CMP14+EGFR -     CBC with Differential/Platelet -     AMB Referral [facility] Care Management  Gastroesophageal reflux disease without esophagitis Assessment & Plan: Refills sent  Orders: -     Pantoprazole  Sodium; Take 1 tablet (40 mg total) by mouth daily before breakfast.  Dispense: 90 tablet; Refill: 1  Nausea Assessment & Plan: Refills sent  Orders: -     Ondansetron  HCl; Take 1 tablet (4 mg total) by mouth every 6 (six) hours.  Dispense: 60 tablet; Refill: 1  Other cough -     Promethazine -DM; Take 5 mLs by mouth 4 (four) times daily as needed.  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  Dispense: 90 tablet; Refill: 1  Note: This chart has been completed using Engineer, civil (consulting) software, and while attempts have been made to ensure accuracy, certain words and phrases may not be transcribed as intended.    Follow-up: Return in about 4 months (around 02/16/2024).   Jerrie Schussler, FNP

## 2023-10-19 NOTE — Patient Instructions (Signed)
 I appreciate the opportunity to provide care to you today!    Follow up:  4 months  Labs: please stop by the lab during the week to get your blood drawn (CBC, CMP, TSH, Lipid profile, HgA1c, Vit D)  Referrals today- Pharmacy  Attached with your AVS, you will find valuable resources for self-education. I highly recommend dedicating some time to thoroughly examine them.   Please continue to a heart-healthy diet and increase your physical activities. Try to exercise for at least five days a week.    It was a pleasure to see you and I look forward to continuing to work together on your health and well-being. Please do not hesitate to call the office if you need care or have questions about your care.  In case of emergency, please visit the Emergency Department for urgent care, or contact our clinic at [phone] to schedule an appointment. We're here to help you!   Have a wonderful day and week. With Gratitude, Xitlalic Maslin MSN, FNP-BC

## 2023-10-19 NOTE — Assessment & Plan Note (Signed)
 The patient reports that Repatha  140 mg every two weeks now costs $557 out-of-pocket, and they were unable to pick up the prescription due to the high cost. A referral will be placed to the pharmacy for medication management. The patient has been compliant with Ezetimibe  10 mg daily. Lifestyle changes were discussed, including reducing the intake of greasy, fatty, and starchy foods and incorporating physical activity as tolerated. The patient and her husband express understanding.

## 2023-10-19 NOTE — Assessment & Plan Note (Signed)
 Refill sent.

## 2023-10-19 NOTE — Assessment & Plan Note (Signed)
 Refills sent

## 2023-10-19 NOTE — Assessment & Plan Note (Signed)
 The patient reports compliance with Jardiance  10 mg daily. She is encouraged to continue her treatment regimen and to implement lifestyle changes by reducing her intake of high-sugar foods and beverages and increasing physical activity.

## 2023-10-28 ENCOUNTER — Other Ambulatory Visit: Payer: Self-pay | Admitting: Family Medicine

## 2023-10-28 DIAGNOSIS — M546 Pain in thoracic spine: Secondary | ICD-10-CM

## 2023-10-30 ENCOUNTER — Ambulatory Visit: Payer: Self-pay | Admitting: Family Medicine

## 2023-10-30 NOTE — Telephone Encounter (Signed)
Copied from CRM [id number]. Topic: Clinical - Red Word Triage >> Oct 30, 2023 10:40 AM Prudencio Pair wrote: Red Word that prompted transfer to Nurse Triage: Patient states she is congested, running a temp, and wheezing. States she feels like she's been run over. Patient stated her husband was sick all weekend & he went to the doctor & was diagnosed with the flu. Reason for Disposition  Patient is HIGH RISK (e.g., age > 64 years, pregnant, HIV+, or chronic medical condition)  Answer Assessment - Initial Assessment Questions 1. WORST SYMPTOM: "What is your worst symptom?" (e.g., cough, runny nose, muscle aches, headache, sore throat, fever)      My husband has the flu and now I'm having the same symptoms.    2. ONSET: "When did your flu symptoms start?"      I got sneezing congestion, coughing. Symptoms started Sat. Afternoon.    3. COUGH: "How bad is the cough?"       My head is full of snot until I washed it out.   I sneezed all night long.   4. RESPIRATORY DISTRESS: "Describe your breathing."      I'm taking albuterol inhaler.    No shortness of breath 5. FEVER: "Do you have a fever?" If Yes, ask: "What is your temperature, how was it measured, and when did it start?"     99.5 this morning 6. EXPOSURE: "Were you exposed to someone with influenza?"       Yes my husband 7. FLU VACCINE: "Did you get a flu shot this year?"     Not asked 8. HIGH RISK DISEASE: "Do you have any chronic medical problems?" (e.g., heart or lung disease, asthma, weak immune system, or other HIGH RISK conditions)     Age, asthma that's why I have albuterol inhaler.   I have diabetes. 9. PREGNANCY: "Is there any chance you are pregnant?" "When was your last menstrual period?"     N/A 10. OTHER SYMPTOMS: "Do you have any other symptoms?"  (e.g., runny nose, muscle aches, headache, sore throat)       Head congestion, a couple days ago had a sore throat but not now, ear pain in both ears.   No vomiting or  diarrhea.  Protocols used: Influenza (Flu) - Seasonal-A-AH  Chief Complaint: Has the flu from her husband    I think I have a sinus infection.   Refusing all appts.   Outside of the window for Tamiflu.   Offered her several times to make an appt for today or Monday however she refused.   "I don't want to come in".   I want to stay in where it's warm.    Symptoms: congestion, low grade temp, using albuterol inhaler  Denies being short of breath "not really".   Frequency: Sat. Symptoms started Pertinent Negatives: Patient denies wanting an appt.   "I'm a retired Engineer, civil (consulting)"   Disposition: [] ED /[] Urgent Care (no appt availability in office) / [] Appointment(In office/virtual)/ []  [facility]/ [x] Home Care/ [] Refused Recommended Disposition /[] [facility]/ []  Follow-up with PCP Additional Notes: I went over the home care advice which she was mostly already doing.   Instructed her to call back on Monday if not getting better.   She was agreeable to this plan.

## 2023-10-30 NOTE — Telephone Encounter (Signed)
 Noted and agree.

## 2023-11-05 ENCOUNTER — Other Ambulatory Visit (INDEPENDENT_AMBULATORY_CARE_PROVIDER_SITE_OTHER): Payer: Self-pay | Admitting: Gastroenterology

## 2023-11-05 ENCOUNTER — Other Ambulatory Visit: Payer: Self-pay | Admitting: Family Medicine

## 2023-11-05 DIAGNOSIS — K743 Primary biliary cirrhosis: Secondary | ICD-10-CM

## 2023-11-05 NOTE — Telephone Encounter (Signed)
 Last seen 05/27/2022, Patient will need an office visit for further fills.

## 2023-11-06 ENCOUNTER — Telehealth: Payer: Self-pay

## 2023-11-06 NOTE — Progress Notes (Signed)
 Care Guide Pharmacy Note  11/06/2023 Name: [name] MRN: [id number] DOB: [birth year]  Referred By: Gilmore Laroche, FNP Reason for referral: Care Coordination (Outreach to schedule with Pharm d )   Elizabeth [name] is a 83 y.o. year old female who is a primary care patient of Gilmore Laroche, FNP.  [name] was referred to the pharmacist for assistance related to: HTN and DMII  Successful contact was made with the patient to discuss pharmacy services including being ready for the pharmacist to call at least 5 minutes before the scheduled appointment time and to have medication bottles and any blood pressure readings ready for review. The patient agreed to meet with the pharmacist via telephone visit on (date/time).11/13/2023  Penne Lash , RMA     [place]  [facility], [facility] Guide  Direct Dial: [phone]  Website: [email or web address]

## 2023-11-13 ENCOUNTER — Other Ambulatory Visit: Payer: Medicare Other

## 2023-11-13 NOTE — Progress Notes (Signed)
 11/13/2023 Name: Elizabeth [name] MRN: [id number] DOB: 05-07-1941  Chief Complaint  Patient presents with   Hyperlipidemia    Elizabeth [name] is a 83 y.o. year old female who presented for a telephone visit.   They were referred to the pharmacist by their PCP for assistance in managing hyperlipidemia and medication access.    Subjective:  Care Team: Primary Care Provider: Gilmore Laroche, FNP    Medication Access/Adherence  Current Pharmacy:  [facility] - [place] - [address] [place] [address] Phone: [phone] Fax: [phone]   Patient reports affordability concerns with their medications: Yes  Patient reports access/transportation concerns to their pharmacy: No  Patient reports adherence concerns with their medications:  No     Hyperlipidemia/ASCVD Risk Reduction  Current lipid lowering medications: repatha, zetia Medications tried in the past: simvastatin, atorvastatin  Antiplatelet regimen: clopidogrel  ASCVD History: CVA  Current physical activity: as able  Current medication access support: will enroll in [employer] grant  Clinical ASCVD: Yes  The ASCVD Risk score (Arnett DK, et al., 2019) failed to calculate for the following reasons:   The 2019 ASCVD risk score is only valid for ages 31 to 64   Risk score cannot be calculated because patient has a medical history suggesting prior/existing ASCVD    Objective:  Lab Results  Component Value Date   HGBA1C 5.7 (H) 10/24/2022    Lab Results  Component Value Date   CREATININE 0.60 06/08/2023   BUN 21 06/08/2023   NA 137 06/08/2023   K 3.8 06/08/2023   CL 100 06/08/2023   CO2 27 06/08/2023    Lab Results  Component Value Date   CHOL 120 10/24/2022   HDL 63 10/24/2022   LDLCALC 43 10/24/2022   TRIG 66 10/24/2022   CHOLHDL 1.9 10/24/2022    Medications Reviewed Today     Reviewed by Danella Maiers, [facility] (Pharmacist) on 11/20/23 at 1432  Med List Status:  <None>   Medication Order Taking? Sig Documenting Provider Last Dose Status Informant  albuterol (VENTOLIN HFA) 108 (90 Base) MCG/ACT inhaler [id number] No INHALE TWO PUFFS BY MOUTH EVERY 6 HOURS AS NEEDED FOR wheezing OR SHORTNESS OF Alfredo Martinez, FNP Taking Active   amLODipine (NORVASC) 10 MG tablet [id number]  TAKE 1 TABLET BY MOUTH EVERY DAY Gilmore Laroche, FNP  Active   azelastine (ASTELIN) 0.1 % nasal spray [id number] No INSTILL 2 SPRAYS IN EACH NOSTRIL TWICE DAILY Gilmore Laroche, FNP Taking Active   chlorpheniramine (ALLERGY RELIEF) 4 MG tablet [id number] No Take 4 mg by mouth 2 (two) times daily as needed for allergies. [provider] Taking Active   clopidogrel (PLAVIX) 75 MG tablet [id number]  Take 1 tablet (75 mg total) by mouth daily. Gilmore Laroche, FNP  Active   cyclobenzaprine (FLEXERIL) 5 MG tablet [id number]  TAKE 1 TABLET BY MOUTH AT BEDTIME Gilmore Laroche, FNP  Active   ezetimibe (ZETIA) 10 MG tablet [id number] No TAKE 1 TABLET BY MOUTH DAILY Gilmore Laroche, FNP Taking Active   Grape Seed OIL [id number] No by Does not apply route daily at 6 (six) AM. [provider] Taking Active   hydrochlorothiazide (HYDRODIURIL) 12.5 MG tablet [id number]  TAKE 1 TABLET BY MOUTH DAILY Gilmore Laroche, FNP  Active   ibuprofen (ADVIL) 800 MG tablet [id number] No TAKE 1 TABLET BY MOUTH EVERY 8 HOURS AS NEEDED Vickki Hearing, MD Taking Active   meclizine (ANTIVERT) 12.5 MG tablet [id number]  No TAKE 1 TABLET BY MOUTH THREE TIMES DAILY AS NEEDED FOR Sinclair Ship, FNP Taking Active   methocarbamol (ROBAXIN) 500 MG tablet [id number] No Take 1 tablet (500 mg total) by mouth every 6 (six) hours as needed for muscle spasms. Gilmore Laroche, FNP Taking Active   montelukast (SINGULAIR) 10 MG tablet [id number]  TAKE 1 TABLET BY MOUTH AT BEDTIME Gilmore Laroche, FNP  Active   ondansetron (ZOFRAN) 4 MG tablet [id number]  Take 1 tablet (4 mg total) by mouth every 6 (six)  hours. Gilmore Laroche, FNP  Active   OVER THE COUNTER MEDICATION [id number] No daily at 6 (six) AM. brain essense [provider] Taking Active   pantoprazole (PROTONIX) 40 MG tablet [id number]  Take 1 tablet (40 mg total) by mouth daily before breakfast. Gilmore Laroche, FNP  Active   polyethylene glycol-electrolytes (TRILYTE) 420 g solution [id number] No Take 4,000 mLs by mouth as directed. Marguerita Merles, Reuel Boom, MD Taking Active   promethazine-dextromethorphan (PROMETHAZINE-DM) 6.25-15 MG/5ML syrup [id number]  Take 5 mLs by mouth 4 (four) times daily as needed. Gilmore Laroche, FNP  Active   REPATHA SURECLICK 140 MG/ML Ivory Broad [id number] No INJECT 1 ML into THE SKIN EVERY 14 DAYS Branch, Dorothe Pea, MD Taking Active   Specialty Vitamins Products (COLLAGEN ULTRA PO) [id number] No Take 60 mg by mouth 1 day or 1 dose. Collagen peptides [provider] Taking Active   UNABLE TO FIND [id number] No 2 (two) times daily. Med Name: cinnamon, chromium & biotin [provider] Taking Active   UNABLE TO FIND [id number] No 1 day or 1 dose. Med Name: curamin 1/2 tablet for pain [provider] Taking Active   ursodiol (ACTIGALL) 300 MG capsule [id number] No TAKE ONE CAPSULE BY MOUTH THREE TIMES DAILY Carlan, Chelsea L, NP Taking Active   Vitamin D, Ergocalciferol, (DRISDOL) 1.25 MG (50000 UNIT) CAPS capsule [id number] No TAKE ONE CAPSULE BY MOUTH EVERY 7 DAYS Gilmore Laroche, FNP Taking Active               Assessment/Plan:   Hyperlipidemia/ASCVD Risk Reduction: - Currently controlled.  - Reviewed long term complications of uncontrolled cholesterol - Reviewed dietary recommendations including FOLLOWING A HEART HEALTHY DIET/HEALTHY PLATE METHOD - Recommend to CONTINUE REPATHA & ezetimibe given ASCVD history (followed by cardiology) [facility] [facility] [employer] [facility] FOR COPAY ASSISTANCE MAILED GRANT COPAY CARD TO PATIENT'S HOME -->INSTRUCTED HER TO TAKE TO THE PHARMACY  TO FILL FOR $0 COPAY   Follow Up Plan: AS NEEDED  Kieth Brightly, PharmD, BCACP, CPP Clinical Pharmacist, [facility]

## 2023-11-14 ENCOUNTER — Other Ambulatory Visit: Payer: Self-pay | Admitting: Family Medicine

## 2023-11-14 DIAGNOSIS — I152 Hypertension secondary to endocrine disorders: Secondary | ICD-10-CM

## 2023-11-14 DIAGNOSIS — E1159 Type 2 diabetes mellitus with other circulatory complications: Secondary | ICD-10-CM

## 2023-11-24 ENCOUNTER — Ambulatory Visit: Payer: Medicare Other | Admitting: Family Medicine

## 2023-11-24 ENCOUNTER — Encounter: Payer: Self-pay | Admitting: Family Medicine

## 2023-11-24 VITALS — BP 148/70 | HR 98 | Temp 97.3°F | Ht 62.0 in | Wt 153.0 lb

## 2023-11-24 DIAGNOSIS — J441 Chronic obstructive pulmonary disease with (acute) exacerbation: Secondary | ICD-10-CM | POA: Diagnosis not present

## 2023-11-24 DIAGNOSIS — E1149 Type 2 diabetes mellitus with other diabetic neurological complication: Secondary | ICD-10-CM | POA: Diagnosis not present

## 2023-11-24 DIAGNOSIS — I152 Hypertension secondary to endocrine disorders: Secondary | ICD-10-CM

## 2023-11-24 DIAGNOSIS — R932 Abnormal findings on diagnostic imaging of liver and biliary tract: Secondary | ICD-10-CM

## 2023-11-24 DIAGNOSIS — E1159 Type 2 diabetes mellitus with other circulatory complications: Secondary | ICD-10-CM | POA: Diagnosis not present

## 2023-11-24 DIAGNOSIS — I700 Atherosclerosis of aorta: Secondary | ICD-10-CM

## 2023-11-24 DIAGNOSIS — E079 Disorder of thyroid, unspecified: Secondary | ICD-10-CM

## 2023-11-24 DIAGNOSIS — Z720 Tobacco use: Secondary | ICD-10-CM

## 2023-11-24 DIAGNOSIS — I69354 Hemiplegia and hemiparesis following cerebral infarction affecting left non-dominant side: Secondary | ICD-10-CM

## 2023-11-24 LAB — BAYER DCA HB A1C WAIVED: HB A1C (BAYER DCA - WAIVED): 5.7 % — ABNORMAL HIGH (ref 4.8–5.6)

## 2023-11-24 NOTE — Progress Notes (Signed)
 Subjective:  Patient ID: Elizabeth [name], female    DOB: [birth year]  Age: 83 y.o. MRN: [id number]  CC: New Patient (Initial Visit) (Not happy with previous care. )   HPI Elizabeth [name] presents for new patient office visit.  She has had multiple strokes and a brain aneurysm.  She is able to ambulate with a walker.  Currently she has upper respiratory symptoms including nasal congestion and a sore throat.  She also has a cough that has scantly been productive.  Follow-up of diabetes. Patient does not check blood sugar at home Patient denies symptoms such as polyuria, polydipsia, excessive hunger, nausea No significant hypoglycemic spells noted. Medications as noted below. Taking them regularly without complication/adverse reaction being reported today.   Follow-up of hypertension. Patient has no history of headache chest pain or shortness of breath or recent cough. Patient also denies symptoms of TIA such as numbness weakness lateralizing. Patient checks  blood pressure at home and has not had any elevated readings recently. Patient denies side effects from his medication. States taking it regularly.  Patient also has Plavix she takes due to her strokes for prevention of another 1.  Patient in for follow-up of elevated cholesterol. Doing well without complaints on current medication.  She currently uses [place] and Repatha.  The Repatha is managed by her cardiologist.   Currently no chest pain, shortness of breath or other cardiovascular related symptoms noted.     11/24/2023   10:36 AM 06/15/2023   10:41 AM 01/27/2023   11:26 AM  Depression screen PHQ 2/9  Decreased Interest  0 0  Down, Depressed, Hopeless  0 2  PHQ - 2 Score  0 2  Altered sleeping  0 0  Tired, decreased energy  0 0  Change in appetite  0 0  Feeling bad or failure about yourself   0 0  Trouble concentrating  0 0  Moving slowly or fidgety/restless  0 1  Suicidal thoughts  0 2  PHQ-9 Score  0 5  Difficult doing work/chores  Not difficult at all Not difficult at all Somewhat difficult    History Elizabeth [name] has a past medical history of Absolute anemia (07/28/2018), Active smoker, Allergy, Aneurysm (HCC), Anxiety, Arthritis, Asthma, Cataract, Cerebrovascular accident (CVA) due to thrombosis of cerebral artery (HCC) (01/30/2022), Chronic airway obstruction, not elsewhere classified, Clotting disorder (HCC), Depression, Diarrhea, Diverticulosis of colon (without mention of hemorrhage), Esophageal reflux, Family history of malignant neoplasm of gastrointestinal tract, GI bleed, Headache(784.0), Neuromuscular disorder (HCC), Obesity, unspecified, Other and unspecified hyperlipidemia, Personal history of colonic polyps, Stricture and stenosis of esophagus, Stroke (HCC), Thyroid disease, Type II or unspecified type diabetes mellitus without mention of complication, not stated as uncontrolled, Unspecified asthma(493.90), and Unspecified essential hypertension.   She has a past surgical history that includes Tonsillectomy; Lumbar disc surgery; Knee arthroscopy; Appendectomy; Cardiac catheterization (2006); Cardiac catheterization; Back surgery; Cerebral angiogram (12/20/2015); Total abdominal hysterectomy; IR ANGIO INTRA EXTRACRAN SEL COM CAROTID INNOMINATE BILAT MOD SED (01/14/2018); IR ANGIO VERTEBRAL SEL SUBCLAVIAN INNOMINATE UNI L MOD SED (01/14/2018); IR ANGIO VERTEBRAL SEL VERTEBRAL UNI R MOD SED (01/14/2018); IR ANGIO VERTEBRAL SEL SUBCLAVIAN INNOMINATE UNI L MOD SED (05/11/2018); IR ANGIO VERTEBRAL SEL VERTEBRAL UNI R MOD SED (05/11/2018); IR ANGIO INTRA EXTRACRAN SEL COM CAROTID INNOMINATE BILAT MOD SED (05/11/2018); Colonoscopy (N/A, 08/09/2018); Esophagogastroduodenoscopy (N/A, 08/09/2018); Esophageal dilation (N/A, 08/09/2018); polypectomy (08/09/2018); Givens capsule study (N/A, 08/25/2018); and Brain surgery.   Her family history includes Asthma in her mother; Cancer in  her paternal grandmother and sister; Colon cancer in an other family member;  Diabetes in her mother, paternal grandmother, and sister; Drug abuse in her mother; Early death in her father; Heart attack in her sister; Heart disease in her brother, maternal grandmother, mother, paternal grandfather, and sister; Hypertension in her mother and sister; Kidney disease in her mother; Lung disease in her brother; Mental illness in her paternal grandmother and sister; Stroke in her mother; Thyroid cancer in an other family member; Tuberculosis in her maternal grandfather.She reports that she has been smoking e-cigarettes. She has been exposed to tobacco smoke. She has never used smokeless tobacco. She reports that she does not drink alcohol and does not use drugs.    ROS Review of Systems  Constitutional: Negative.   HENT:  Negative for congestion.   Eyes:  Negative for visual disturbance.  Respiratory:  Negative for shortness of breath.   Cardiovascular:  Negative for chest pain.  Gastrointestinal:  Negative for abdominal pain, constipation, diarrhea, nausea and vomiting.  Genitourinary:  Negative for difficulty urinating.  Musculoskeletal:  Negative for arthralgias and myalgias.  Neurological:  Negative for headaches.  Psychiatric/Behavioral:  Negative for sleep disturbance.     Objective:  BP (!) 148/70   Pulse 98   Temp (!) 97.3 F (36.3 C)   Ht 5\' 2"  (1.575 m)   Wt 153 lb (69.4 kg)   SpO2 99%   BMI 27.98 kg/m   BP Readings from Last 3 Encounters:  11/24/23 (!) 148/70  10/19/23 129/60  09/15/23 133/67    Wt Readings from Last 3 Encounters:  11/24/23 153 lb (69.4 kg)  10/19/23 140 lb (63.5 kg)  09/15/23 133 lb (60.3 kg)     Physical Exam Constitutional:      General: She is not in acute distress.    Appearance: She is well-developed.  HENT:     Head: Normocephalic and atraumatic.  Eyes:     Conjunctiva/sclera: Conjunctivae normal.     Pupils: Pupils are equal, round, and reactive to light.  Neck:     Thyroid: No thyromegaly.  Cardiovascular:      Rate and Rhythm: Normal rate and regular rhythm.     Heart sounds: Normal heart sounds. No murmur heard. Pulmonary:     Effort: Pulmonary effort is normal. No respiratory distress.     Breath sounds: Normal breath sounds. No wheezing or rales.  Abdominal:     General: Bowel sounds are normal. There is no distension.     Palpations: Abdomen is soft.     Tenderness: There is no abdominal tenderness.  Musculoskeletal:        General: Normal range of motion.     Cervical back: Normal range of motion and neck supple.  Lymphadenopathy:     Cervical: No cervical adenopathy.  Skin:    General: Skin is warm and dry.  Neurological:     Mental Status: She is alert and oriented to person, place, and time.  Psychiatric:        Behavior: Behavior normal.        Thought Content: Thought content normal.        Judgment: Judgment normal.   In spite of her stroke her strength is symmetric although diminished.  Interestingly the left hand is much smaller than the right possibly from muscle atrophy.  She does have some restricted range of motion for the shoulder on the left.    Assessment & Plan:   Khiara was seen today for  new patient (initial visit).  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 Puthoff's methocarbamol and promethazine-dextromethorphan. I am also having her maintain her Grape Seed, OVER THE COUNTER MEDICATION, polyethylene glycol-electrolytes, ursodiol, Specialty Vitamins Products (COLLAGEN ULTRA PO), UNABLE TO FIND, UNABLE TO FIND, chlorpheniramine, ibuprofen, Repatha SureClick, meclizine, ezetimibe, azelastine, albuterol, Vitamin D (Ergocalciferol), clopidogrel, pantoprazole, ondansetron, cyclobenzaprine, montelukast, amLODipine, and hydrochlorothiazide.  Allergies as of 11/24/2023       Reactions   Banana Anaphylaxis   Codeine Nausea And Vomiting, Other (See Comments)   PROJECTILE VOMITING   Stevia [stevioside] Swelling, Other (See Comments)   Face, tongue, and eye swelling    Actos [pioglitazone] Swelling   Banana Flavoring Agent (non-screening)    Benicar [olmesartan Medoxomil] Other (See Comments)   Feel bad   Benicar [olmesartan]    Chantix [varenicline] Other (See Comments)   "Worked opposite."   Diovan [valsartan] Other (See Comments)   Feel bad   Lipitor [atorvastatin] Other (See Comments)   REACTION:  Joint pain   Rice Other (See Comments)   Gives patient indigestion   Spinach Other (See Comments)   Stomach problems   Statins Other (See Comments)   Feels bad, leg cramps, worries about liver effects.   Vicodin [hydrocodone-acetaminophen] Nausea And Vomiting   Zocor [simvastatin] Other (See Comments)   REACTION:  "made liver function incorrectly"   Ace Inhibitors Cough   Celebrex [celecoxib] Rash   Latex Rash, Other (See Comments)   REACTION: red rash   Metformin And Related     GI - upset    Mushroom Extract Complex (obsolete) Palpitations   Penicillins Rash, Other (See Comments)   Has patient had a PCN reaction causing immediate rash, facial/tongue/throat swelling, SOB or lightheadedness with hypotension: No Has patient had a PCN reaction causing severe rash involving mucus membranes or skin necrosis: No Has patient had a PCN reaction that required hospitalization: No- MD office Has patient had a PCN reaction occurring within the last 10 years: No If all of the above answers are "NO", then may proceed with Cephalosporin use.   Sulfonamide Derivatives Rash        Medication List  Accurate as of November 24, 2023  9:19 PM. If you have any questions, ask your nurse or doctor.          STOP taking these medications    methocarbamol 500 MG tablet Commonly known as: ROBAXIN Stopped by: Broadus John Eulanda Dorion   promethazine-dextromethorphan 6.25-15 MG/5ML syrup Commonly known as: PROMETHAZINE-DM Stopped by: Hakop Humbarger       TAKE these medications    albuterol 108 (90 Base) MCG/ACT inhaler Commonly known as: VENTOLIN HFA INHALE TWO PUFFS BY MOUTH EVERY 6 HOURS AS NEEDED FOR wheezing OR SHORTNESS OF BREATH   Allergy Relief 4 MG tablet Generic drug: chlorpheniramine Take 4 mg by mouth 2 (two) times daily as needed for allergies.   amLODipine 10 MG tablet Commonly known as: NORVASC TAKE 1 TABLET BY MOUTH EVERY DAY   azelastine 0.1 % nasal spray Commonly known as: ASTELIN INSTILL 2 SPRAYS IN EACH NOSTRIL TWICE DAILY   clopidogrel 75 MG tablet Commonly known as: PLAVIX Take 1 tablet (75 mg total) by mouth daily.   COLLAGEN ULTRA PO Take 60 mg by mouth 1 day or 1 dose. Collagen peptides   cyclobenzaprine 5 MG tablet Commonly known as: FLEXERIL TAKE 1 TABLET BY MOUTH AT BEDTIME   ezetimibe 10 MG tablet Commonly known as: ZETIA TAKE 1 TABLET BY MOUTH DAILY   Grape Seed Oil by Does not apply route daily at 6 (six) AM.    hydrochlorothiazide 12.5 MG tablet Commonly known as: HYDRODIURIL TAKE 1 TABLET BY MOUTH DAILY   ibuprofen 800 MG tablet Commonly known as: ADVIL TAKE 1 TABLET BY MOUTH EVERY 8 HOURS AS NEEDED   meclizine 12.5 MG tablet Commonly known as: ANTIVERT TAKE 1 TABLET BY MOUTH THREE TIMES DAILY AS NEEDED FOR DIZZINESS   montelukast 10 MG tablet Commonly known as: SINGULAIR TAKE 1 TABLET BY MOUTH AT BEDTIME   ondansetron 4 MG tablet Commonly known as: ZOFRAN Take 1 tablet (4 mg total) by mouth every 6 (six) hours.   OVER THE COUNTER MEDICATION daily at 6 (six) AM. brain essense   pantoprazole 40 MG tablet Commonly known as: PROTONIX Take 1 tablet (40 mg total) by mouth daily before breakfast.   polyethylene glycol-electrolytes 420 g solution Commonly known as: TriLyte Take 4,000 mLs by mouth as directed.   Repatha SureClick 140 MG/ML Soaj Generic drug: Evolocumab INJECT 1 ML into THE SKIN EVERY 14 DAYS   UNABLE TO FIND 2 (two) times daily. Med Name: cinnamon, chromium & biotin   UNABLE TO FIND 1 day or 1 dose. Med Name: curamin 1/2 tablet for pain   ursodiol 300 MG capsule Commonly known as: ACTIGALL TAKE ONE CAPSULE BY MOUTH THREE TIMES DAILY   Vitamin D (Ergocalciferol) 1.25 MG (50000 UNIT) Caps capsule Commonly known as: DRISDOL TAKE ONE CAPSULE BY MOUTH EVERY 7 DAYS         Follow-up: Return in about 3 months (around 02/24/2024).  Mechele Claude, M.D.

## 2023-11-25 ENCOUNTER — Telehealth: Payer: Self-pay

## 2023-11-25 LAB — CMP14+EGFR
ALT: 10 IU/L (ref 0–32)
AST: 15 IU/L (ref 0–40)
Albumin: 4.1 g/dL (ref 3.8–4.8)
Alkaline Phosphatase: 133 IU/L — ABNORMAL HIGH (ref 44–121)
BUN/Creatinine Ratio: 16 (ref 12–28)
BUN: 11 mg/dL (ref 8–27)
Bilirubin Total: 0.3 mg/dL (ref 0.0–1.2)
CO2: 22 mmol/L (ref 20–29)
Calcium: 9.9 mg/dL (ref 8.7–10.3)
Chloride: 99 mmol/L (ref 96–106)
Creatinine, Ser: 0.69 mg/dL (ref 0.57–1.00)
Globulin, Total: 2.8 g/dL (ref 1.5–4.5)
Glucose: 155 mg/dL — ABNORMAL HIGH (ref 70–99)
Potassium: 4.2 mmol/L (ref 3.5–5.2)
Sodium: 138 mmol/L (ref 134–144)
Total Protein: 6.9 g/dL (ref 6.0–8.5)
eGFR: 88 mL/min/{1.73_m2} (ref >59)

## 2023-11-25 LAB — TSH+FREE T4
Free T4: 1.08 ng/dL (ref 0.82–1.77)
TSH: 1.44 u[IU]/mL (ref 0.450–4.500)

## 2023-11-25 LAB — LIPID PANEL
Chol/HDL Ratio: 2.6 ratio (ref 0.0–4.4)
Cholesterol, Total: 153 mg/dL (ref 100–199)
HDL: 59 mg/dL (ref >39)
LDL Chol Calc (NIH): 76 mg/dL (ref 0–99)
Triglycerides: 95 mg/dL (ref 0–149)
VLDL Cholesterol Cal: 18 mg/dL (ref 5–40)

## 2023-11-25 LAB — CBC WITH DIFFERENTIAL/PLATELET
Basophils Absolute: 0.1 10*3/uL (ref 0.0–0.2)
Basos: 1 %
EOS (ABSOLUTE): 0.4 10*3/uL (ref 0.0–0.4)
Eos: 7 %
Hematocrit: 28.9 % — ABNORMAL LOW (ref 34.0–46.6)
Hemoglobin: 8 g/dL — CL (ref 11.1–15.9)
Immature Grans (Abs): 0 10*3/uL (ref 0.0–0.1)
Immature Granulocytes: 0 %
Lymphocytes Absolute: 1.1 10*3/uL (ref 0.7–3.1)
Lymphs: 18 %
MCH: 18.9 pg — ABNORMAL LOW (ref 26.6–33.0)
MCHC: 27.7 g/dL — ABNORMAL LOW (ref 31.5–35.7)
MCV: 68 fL — ABNORMAL LOW (ref 79–97)
Monocytes Absolute: 0.4 10*3/uL (ref 0.1–0.9)
Monocytes: 6 %
Neutrophils Absolute: 4 10*3/uL (ref 1.4–7.0)
Neutrophils: 68 %
Platelets: 369 10*3/uL (ref 150–450)
RBC: 4.23 x10E6/uL (ref 3.77–5.28)
RDW: 15.2 % (ref 11.7–15.4)
WBC: 5.9 10*3/uL (ref 3.4–10.8)

## 2023-11-26 ENCOUNTER — Other Ambulatory Visit: Payer: Self-pay | Admitting: Family Medicine

## 2023-11-26 ENCOUNTER — Encounter: Payer: Self-pay | Admitting: Nurse Practitioner

## 2023-11-26 ENCOUNTER — Encounter: Payer: Self-pay | Admitting: Family Medicine

## 2023-11-26 ENCOUNTER — Ambulatory Visit: Payer: Medicare Other | Attending: Nurse Practitioner | Admitting: Nurse Practitioner

## 2023-11-26 VITALS — BP 116/58 | HR 94 | Ht 62.0 in | Wt 154.0 lb

## 2023-11-26 DIAGNOSIS — E785 Hyperlipidemia, unspecified: Secondary | ICD-10-CM | POA: Diagnosis not present

## 2023-11-26 DIAGNOSIS — D508 Other iron deficiency anemias: Secondary | ICD-10-CM

## 2023-11-26 DIAGNOSIS — I10 Essential (primary) hypertension: Secondary | ICD-10-CM | POA: Diagnosis not present

## 2023-11-26 DIAGNOSIS — Z8673 Personal history of transient ischemic attack (TIA), and cerebral infarction without residual deficits: Secondary | ICD-10-CM | POA: Diagnosis not present

## 2023-11-26 DIAGNOSIS — I2510 Atherosclerotic heart disease of native coronary artery without angina pectoris: Secondary | ICD-10-CM | POA: Diagnosis not present

## 2023-11-26 NOTE — Patient Instructions (Signed)
 Medication Instructions:  Continue all current medications.   Labwork: none  Testing/Procedures: none  Follow-Up: 6 months   Any Other Special Instructions Will Be Listed Below (If Applicable).   If you need a refill on your cardiac medications before your next appointment, please call your pharmacy.

## 2023-11-26 NOTE — Progress Notes (Unsigned)
 Cardiology Office Note:  .   Date:  05/28/2023 ID:  Elizabeth [name], DOB 02-14-1941, MRN [id number] PCP: Mechele Claude, MD  [facility] Providers Cardiologist:  Dina Rich, MD    History of Present Illness: Marland Kitchen   Elizabeth [name] is a 83 y.o. female with a PMH of CAD, hyperlipidemia, hypertension, type 2 diabetes, asthma, history of GI bleed, GERD, anxiety, depression, diverticulosis, obesity, and history of CVA, who presents today for 1 year follow-up.  History of cardiac cath in 12/17/2004 revealed mild nonobstructive CAD.  Last seen by Dr. Dina Rich on February 21, 2022.  She was overall doing well at the time.  Intolerant to statins and was resistant to PCSK9 inhibitors but due to her recent stroke at the time, she was willing to start these medications.  Lipid clinic referral was placed.  Today she presents for 1 year follow-up with her husband who is her caregiver.  She is doing well from a cardiac perspective. Does endorse some difficulty swallowing and has coughing/phlegm with this. Denies any chest pain, shortness of breath, palpitations, syncope, presyncope, dizziness, orthopnea, PND, swelling or significant weight changes, acute bleeding, or claudication. She has completed therapy after her stroke per her husband's report. Still has generalized weakness and some deficits after her stroke including left facial droop, mumbled speech and generalized weakness.   Stage: Patient is a retired Engineer, civil (consulting).  ROS: Negative. See HPI.  Studies Reviewed: Marland Kitchen    EKG:      12-17-21 Echo [facility]): Summary    1. The left ventricle is normal in size with normal wall thickness.    2. The left ventricular systolic function is normal, LVEF is visually  estimated at > 55%.    3. There is grade I diastolic dysfunction (impaired relaxation).    4. The left atrium is mildly dilated in size.    5. The right ventricle is normal in size, with normal systolic function.    6. Agitated saline study is  negative.   Carotid doppler 02/2012: Summary:   - Bilaterally No significant internal carotid artery    stenosis demonstrated. Vertebrals are patent with    antegrade flow.  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  Other specific details can be found in the table(s) above.  Echo December 18, 2011: Study Conclusions   - Left ventricle: The cavity size was normal. Wall thickness    was increased in a pattern of mild LVH. Systolic function    was normal. The estimated ejection fraction was in the    range of 60% to 65%. Wall motion was normal; there were no    regional wall motion abnormalities. Doppler parameters are    consistent with abnormal left ventricular relaxation    (grade 1 diastolic dysfunction). Doppler parameters are    consistent with elevated mean left atrial filling    pressure.  - Aortic valve: Valve area: 3.02cm^2(VTI). Valve area:    2.57cm^2 (Vmax).  LHC 12/2004: CARDIAC CATHETERIZATION   INDICATIONS: Ms. Pacholski is a pleasant 83 year old woman with a history of  hypertension, dyslipidemia, type 2 diabetes mellitus, tobacco use, and  asthma. She has had recent problems with mild dyspnea and lower extremity  edema. Given her cardiac risk factor profile, she is referred for diagnostic coronary angiography and left ventriculography to assess coronary anatomy  and left ventricular function.   PROCEDURE:   1. Left  heart catheterization.   2. Selective coronary angiography.   3. Left ventriculography.   DESCRIPTION OF PROCEDURE: The area about the right femoral artery was  anesthetized 1% lidocaine and a 4-French sheath was placed in the right  femoral artery via modified Seldinger technique. Standard preformed 4-French  JL-4 and JR-4 catheters used for selective coronary angiography and angled  pigtail catheters used for left heart catheterization and left  ventriculography. All exchanges  were made over wire. The patient tolerated  procedure well without immediate complications.   HEMODYNAMIC RESULTS:   1. Left ventricle 151/25 mmHg.   2. Aorta 152/76 mmHg.   ANGIOGRAPHIC FINDINGS:   1. Left main coronary artery is free of significant flow-limiting coronary atherosclerosis.   2. The left anterior descending is a medium caliber vessel with three small  diagonal branches. There are minor Luminal irregularities noted throughout this system with approximately 30% diffuse mid-vessel stenosis. There is also a 30-40% ostial stenosis involving the first diagonal branch. No flow-limiting stenoses are noted.   3. The circumflex coronary artery is a medium caliber vessel. There is a very small branch in the AV groove and two larger bifurcating obtuse marginal branches. Minor Luminal irregularities are noted without flow-limiting stenoses.   4. The right coronary artery is a medium caliber vessel with posterior descending branch. Minor Luminal irregularities are noted. There is a  20% stenosis in the proximal to mid-vessel.   LEFT VENTRICULOGRAPHY: Left ventriculography was performed in the RAO projection revealing an ejection fraction approximately 65% in the setting of ventricular ectopy without significant mitral regurgitation or focal wall motion abnormality.   DIAGNOSES:   1. Mild coronary atherosclerosis as outlined with no flow-limiting stenoses in the major epicardial vessels.   2. Left ventricular ejection fraction of approximately 65% in the setting of ventricular ectopy. No significant mitral regurgitation is noted. The left ventricular end-diastolic pressure is increased at 25 mmHg. At this point would suspect an element of diastolic dysfunction particularly given elevated left renal end-diastolic pressure. The patient is already on an ACE inhibitor and diuretic. We will plan to continue maximizing medical therapy. We will arrange a follow-up 2-D echocardiogram for diastolic parameters  and also to exclude significantly elevated pulmonary pressures. She will have follow-up in the office to review this and her progress.  Physical Exam:   VS:  BP (!) 116/58 (BP Location: Left Arm, Patient Position: Sitting, Cuff Size: Normal)   Pulse 94   Ht 5\' 2"  (1.575 m)   Wt 154 lb (69.9 kg)   SpO2 96%   BMI 28.17 kg/m    Wt Readings from Last 3 Encounters:  11/26/23 154 lb (69.9 kg)  11/24/23 153 lb (69.4 kg)  10/19/23 140 lb (63.5 kg)    GEN: Well nourished, well developed in no acute distress NECK: No JVD; No carotid bruits CARDIAC: S1/S2, slightly irregular rhythm, no murmurs, rubs, gallops RESPIRATORY:  Diminished to auscultation with scattered fine expiratory wheezing/crackles bilaterally, no rales or rhonchi noted.  ABDOMEN: Soft, non-tender, non-distended EXTREMITIES:  No edema; No deformity   ASSESSMENT AND PLAN: .    CAD Stable with no anginal symptoms. No indication for ischemic evaluation. Continue current medication regimen. Heart healthy diet encouraged. Will obtain CBC and BMET in 1 week.   HTN Blood pressure stable and at goal.  Husband reports vital signs are stable at home including heart rate and blood pressure.  Discussed to monitor BP at home at least 2 hours after medications and sitting for 5-10  minutes.  No medication changes at this time. Heart healthy diet encouraged.   HLD LDL 43 10/2022.  She is currently at goal.  Continue current medication regimen. Heart healthy diet encouraged.   Cough, difficulty swallowing, adventitious breath sounds, hx of CVA Patient and husband admit to cough, difficulty swallowing. Most likely d/t after her stroke. Some deficits of stroke noted on exam. See PE above. Will obtain 2 view CXR to r/o any abnormalities and obtain CBC and BMET. Continue to follow with PCP.  Dispo: Follow-up with Dr. Dina Rich or APP in 6 months or sooner anything changes.  Signed, Sharlene Dory, NP

## 2023-11-27 LAB — IRON AND TIBC
Iron Saturation: 3 % — CL (ref 15–55)
Iron: 13 ug/dL — ABNORMAL LOW (ref 27–139)
Total Iron Binding Capacity: 495 ug/dL — ABNORMAL HIGH (ref 250–450)
UIBC: 482 ug/dL — ABNORMAL HIGH (ref 118–369)

## 2023-11-27 LAB — FERRITIN: Ferritin: 6 ng/mL — ABNORMAL LOW (ref 15–150)

## 2023-11-27 LAB — SPECIMEN STATUS REPORT

## 2023-11-30 ENCOUNTER — Telehealth: Payer: Self-pay

## 2023-11-30 ENCOUNTER — Other Ambulatory Visit (HOSPITAL_COMMUNITY): Payer: Self-pay

## 2023-11-30 NOTE — Telephone Encounter (Signed)
 ROI sent to [facility] oncology.

## 2023-12-04 ENCOUNTER — Encounter (INDEPENDENT_AMBULATORY_CARE_PROVIDER_SITE_OTHER): Payer: Self-pay | Admitting: *Deleted

## 2023-12-08 ENCOUNTER — Other Ambulatory Visit (INDEPENDENT_AMBULATORY_CARE_PROVIDER_SITE_OTHER): Payer: Self-pay | Admitting: Gastroenterology

## 2023-12-08 DIAGNOSIS — K743 Primary biliary cirrhosis: Secondary | ICD-10-CM

## 2023-12-17 ENCOUNTER — Encounter (INDEPENDENT_AMBULATORY_CARE_PROVIDER_SITE_OTHER): Payer: Self-pay | Admitting: *Deleted

## 2023-12-23 ENCOUNTER — Other Ambulatory Visit: Payer: Self-pay | Admitting: Family Medicine

## 2023-12-23 DIAGNOSIS — J441 Chronic obstructive pulmonary disease with (acute) exacerbation: Secondary | ICD-10-CM

## 2023-12-28 ENCOUNTER — Other Ambulatory Visit: Payer: Self-pay | Admitting: Family Medicine

## 2023-12-28 ENCOUNTER — Encounter (INDEPENDENT_AMBULATORY_CARE_PROVIDER_SITE_OTHER): Admitting: Gastroenterology

## 2023-12-28 DIAGNOSIS — M546 Pain in thoracic spine: Secondary | ICD-10-CM

## 2024-01-21 NOTE — Telephone Encounter (Signed)
 fyi

## 2024-02-08 ENCOUNTER — Ambulatory Visit (INDEPENDENT_AMBULATORY_CARE_PROVIDER_SITE_OTHER): Admitting: Gastroenterology

## 2024-02-09 ENCOUNTER — Other Ambulatory Visit: Payer: Self-pay | Admitting: Family Medicine

## 2024-02-09 DIAGNOSIS — R110 Nausea: Secondary | ICD-10-CM

## 2024-02-19 ENCOUNTER — Ambulatory Visit: Payer: Medicare Other | Admitting: Family Medicine

## 2024-02-24 ENCOUNTER — Other Ambulatory Visit: Payer: Self-pay | Admitting: Family Medicine

## 2024-02-24 ENCOUNTER — Ambulatory Visit: Admitting: Family Medicine

## 2024-02-24 ENCOUNTER — Other Ambulatory Visit (INDEPENDENT_AMBULATORY_CARE_PROVIDER_SITE_OTHER): Payer: Self-pay | Admitting: Gastroenterology

## 2024-02-24 DIAGNOSIS — M546 Pain in thoracic spine: Secondary | ICD-10-CM

## 2024-02-24 DIAGNOSIS — K743 Primary biliary cirrhosis: Secondary | ICD-10-CM

## 2024-02-24 NOTE — Telephone Encounter (Signed)
 I called and left a detailed message that we have sent in enough medication to last for 45 days, but to get further fills she will need to be seen in the clinic. I asked that she call the office to arrange a follow up appointment.

## 2024-03-02 ENCOUNTER — Ambulatory Visit: Payer: Self-pay | Admitting: Family Medicine

## 2024-03-02 ENCOUNTER — Ambulatory Visit: Admitting: Family Medicine

## 2024-03-02 ENCOUNTER — Encounter: Payer: Self-pay | Admitting: Family Medicine

## 2024-03-02 VITALS — BP 146/56 | HR 90 | Temp 98.0°F | Ht 62.0 in | Wt 159.0 lb

## 2024-03-02 DIAGNOSIS — I152 Hypertension secondary to endocrine disorders: Secondary | ICD-10-CM

## 2024-03-02 DIAGNOSIS — E1149 Type 2 diabetes mellitus with other diabetic neurological complication: Secondary | ICD-10-CM | POA: Diagnosis not present

## 2024-03-02 DIAGNOSIS — D508 Other iron deficiency anemias: Secondary | ICD-10-CM | POA: Diagnosis not present

## 2024-03-02 DIAGNOSIS — E1159 Type 2 diabetes mellitus with other circulatory complications: Secondary | ICD-10-CM

## 2024-03-02 DIAGNOSIS — I700 Atherosclerosis of aorta: Secondary | ICD-10-CM

## 2024-03-02 DIAGNOSIS — E038 Other specified hypothyroidism: Secondary | ICD-10-CM

## 2024-03-02 LAB — BAYER DCA HB A1C WAIVED: HB A1C (BAYER DCA - WAIVED): 5.7 % — ABNORMAL HIGH (ref 4.8–5.6)

## 2024-03-02 MED ORDER — BLOOD GLUCOSE MONITORING SUPPL DEVI: 1.0000 | Freq: Three times a day (TID) | 0 refills | Status: AC

## 2024-03-02 MED ORDER — LANCET DEVICE MISC: 1.0000 | Freq: Three times a day (TID) | 0 refills | Status: AC

## 2024-03-02 MED ORDER — BLOOD GLUCOSE TEST VI STRP: 1.0000 | ORAL_STRIP | Freq: Three times a day (TID) | 11 refills | Status: AC

## 2024-03-02 MED ORDER — LANCETS MISC. MISC: 1.0000 | Freq: Three times a day (TID) | 0 refills | Status: AC

## 2024-03-02 NOTE — Progress Notes (Signed)
 Subjective:  Patient ID: Elizabeth [name],  female    DOB: 07-Jul-1941  Age: 83 y.o.    CC: Diabetes (Nothing new just ongoing  issues. )   HPI Ellon Marasco Putzier presents for  follow-up of hypertension. Patient has no history of headache chest pain or shortness of breath or recent cough. Patient also denies symptoms of TIA such as numbness weakness lateralizing. Patient denies side effects from medication. States taking it regularly.  Patient also  in for follow-up of elevated cholesterol. Doing well without complaints on current medication. Denies side effects  including myalgia and arthralgia and nausea. Also in today for liver function testing. Currently no chest pain, shortness of breath or other cardiovascular related symptoms noted.  Follow-up of diabetes. Patient does check blood sugar at home. Readings run between 80 and 125 Patient denies symptoms such as excessive hunger or urinary frequency, excessive hunger, nausea No significant hypoglycemic spells noted. Medications reviewed. Pt reports taking them regularly. Pt. denies complication/adverse reaction today.    History Vaneza has a past medical history of Absolute anemia (07/28/2018), Active smoker, Allergy, Aneurysm (HCC), Anxiety, Arthritis, Asthma, Cataract, Cerebrovascular accident (CVA) due to thrombosis of cerebral artery (HCC) (01/30/2022), Chronic airway obstruction, not elsewhere classified, Clotting disorder (HCC), Depression, Diarrhea, Diverticulosis of colon (without mention of hemorrhage), Esophageal reflux, Family history of malignant neoplasm of gastrointestinal tract, GI bleed, Headache(784.0), Neuromuscular disorder (HCC), Obesity, unspecified, Other and unspecified hyperlipidemia, Personal history of colonic polyps, Stricture and stenosis of esophagus, Stroke (HCC), Thyroid  disease, Type II or unspecified type diabetes mellitus without mention of complication, not stated as uncontrolled, Unspecified asthma(493.90), and  Unspecified essential hypertension.   She has a past surgical history that includes Tonsillectomy; Lumbar disc surgery; Knee arthroscopy; Appendectomy; Cardiac catheterization (2006); Cardiac catheterization; Back surgery; Cerebral angiogram (12/20/2015); Total abdominal hysterectomy; IR ANGIO INTRA EXTRACRAN SEL COM CAROTID INNOMINATE BILAT MOD SED (01/14/2018); IR ANGIO VERTEBRAL SEL SUBCLAVIAN INNOMINATE UNI L MOD SED (01/14/2018); IR ANGIO VERTEBRAL SEL VERTEBRAL UNI R MOD SED (01/14/2018); IR ANGIO VERTEBRAL SEL SUBCLAVIAN INNOMINATE UNI L MOD SED (05/11/2018); IR ANGIO VERTEBRAL SEL VERTEBRAL UNI R MOD SED (05/11/2018); IR ANGIO INTRA EXTRACRAN SEL COM CAROTID INNOMINATE BILAT MOD SED (05/11/2018); Colonoscopy (N/A, 08/09/2018); Esophagogastroduodenoscopy (N/A, 08/09/2018); Esophageal dilation (N/A, 08/09/2018); polypectomy (08/09/2018); Givens capsule study (N/A, 08/25/2018); and Brain surgery.   Her family history includes Asthma in her mother; Cancer in her paternal grandmother and sister; Colon cancer in an other family member; Diabetes in her mother, paternal grandmother, and sister; Drug abuse in her mother; Early death in her father; Heart attack in her sister; Heart disease in her brother, maternal grandmother, mother, paternal grandfather, and sister; Hypertension in her mother and sister; Kidney disease in her mother; Lung disease in her brother; Mental illness in her paternal grandmother and sister; Stroke in her mother; Thyroid  cancer in an other family member; Tuberculosis in her maternal grandfather.She reports that she has been smoking e-cigarettes. She has been exposed to tobacco smoke. She has never used smokeless tobacco. She reports that she does not drink alcohol  and does not use drugs.  Current Outpatient Medications on File Prior to Visit  Medication Sig Dispense Refill   albuterol  (VENTOLIN  HFA) 108 (90 Base) MCG/ACT inhaler INHALE TWO PUFFS BY MOUTH EVERY 6 HOURS AS NEEDED FOR wheezing OR  SHORTNESS OF BREATH 8.5 g 2   amLODipine  (NORVASC ) 10 MG tablet TAKE 1 TABLET BY MOUTH EVERY DAY 90 tablet 5   azelastine  (ASTELIN ) 0.1 % nasal spray  INSTILL 2 SPRAYS IN EACH NOSTRIL TWICE DAILY 30 mL 2   chlorpheniramine (ALLERGY RELIEF) 4 MG tablet Take 4 mg by mouth 2 (two) times daily as needed for allergies.     clopidogrel  (PLAVIX ) 75 MG tablet Take 1 tablet (75 mg total) by mouth daily. 90 tablet 1   cyclobenzaprine  (FLEXERIL ) 5 MG tablet TAKE 1 TABLET BY MOUTH AT BEDTIME 30 tablet 0   ezetimibe  (ZETIA ) 10 MG tablet TAKE 1 TABLET BY MOUTH DAILY 90 tablet 3   Grape Seed OIL by Does not apply route daily at 6 (six) AM.     hydrochlorothiazide  (HYDRODIURIL ) 12.5 MG tablet TAKE 1 TABLET BY MOUTH DAILY 90 tablet 3   ibuprofen  (ADVIL ) 800 MG tablet TAKE 1 TABLET BY MOUTH EVERY 8 HOURS AS NEEDED 90 tablet 1   meclizine  (ANTIVERT ) 12.5 MG tablet TAKE 1 TABLET BY MOUTH THREE TIMES DAILY AS NEEDED FOR DIZZINESS 15 tablet 0   montelukast  (SINGULAIR ) 10 MG tablet TAKE 1 TABLET BY MOUTH AT BEDTIME 30 tablet 3   ondansetron  (ZOFRAN ) 4 MG tablet Take 1 tablet (4 mg total) by mouth every 6 (six) hours. 60 tablet 1   OVER THE COUNTER MEDICATION daily at 6 (six) AM. brain essense     pantoprazole  (PROTONIX ) 40 MG tablet Take 1 tablet (40 mg total) by mouth daily before breakfast. 90 tablet 1   polyethylene glycol-electrolytes (TRILYTE) 420 g solution Take 4,000 mLs by mouth as directed. 4000 mL 0   REPATHA  SURECLICK 140 MG/ML SOAJ INJECT 1 ML into THE SKIN EVERY 14 DAYS 6 mL 3   Specialty Vitamins Products (COLLAGEN ULTRA PO) Take 60 mg by mouth 1 day or 1 dose. Collagen peptides     UNABLE TO FIND 2 (two) times daily. Med Name: cinnamon, chromium & biotin     UNABLE TO FIND 1 day or 1 dose. Med Name: curamin 1/2 tablet for pain     ursodiol  (ACTIGALL ) 300 MG capsule Take 1 capsule (300 mg total) by mouth 3 (three) times daily. 135 capsule 0   Vitamin D , Ergocalciferol , (DRISDOL ) 1.25 MG (50000 UNIT) CAPS  capsule TAKE ONE CAPSULE BY MOUTH EVERY 7 DAYS 10 capsule 2   No current facility-administered medications on file prior to visit.    ROS Review of Systems  Constitutional: Negative.   HENT: Negative.    Eyes:  Negative for visual disturbance.  Respiratory:  Negative for shortness of breath.   Cardiovascular:  Negative for chest pain.  Gastrointestinal:  Negative for abdominal pain.  Musculoskeletal:  Negative for arthralgias.    Objective:  BP (!) 146/56   Pulse 90   Temp 98 F (36.7 C)   Ht 5' 2 (1.575 m)   Wt 159 lb (72.1 kg)   SpO2 97%   BMI 29.08 kg/m   BP Readings from Last 3 Encounters:  03/02/24 (!) 146/56  11/26/23 (!) 116/58  11/24/23 (!) 148/70    Wt Readings from Last 3 Encounters:  03/02/24 159 lb (72.1 kg)  11/26/23 154 lb (69.9 kg)  11/24/23 153 lb (69.4 kg)    Lab Results  Component Value Date   HGBA1C 5.7 (H) 03/02/2024   HGBA1C 5.7 (H) 11/24/2023   HGBA1C 5.7 (H) 10/24/2022    Physical Exam Constitutional:      General: She is not in acute distress.    Appearance: She is well-developed.   Cardiovascular:     Rate and Rhythm: Normal rate and regular rhythm.  Pulmonary:  Breath sounds: Normal breath sounds.   Musculoskeletal:        General: Normal range of motion.   Skin:    General: Skin is warm and dry.   Neurological:     Mental Status: She is alert and oriented to person, place, and time.         Assessment & Plan:  Other iron deficiency anemia -     CBC with Differential/Platelet -     Iron, TIBC and Ferritin Panel  Hypertension associated with diabetes (HCC) -     CMP14+EGFR  Abdominal aortic atherosclerosis (HCC) -     CBC with Differential/Platelet -     CMP14+EGFR  Type 2 diabetes mellitus with other neurologic complication, unspecified whether long term insulin  use (HCC) -     Microalbumin / creatinine urine ratio -     Bayer DCA Hb A1c Waived -     For home use only DME Other see comment  TSH  (thyroid -stimulating hormone deficiency) -     TSH + free T4  Other orders -     Blood Glucose Monitoring Suppl; 1 each by Does not apply route in the morning, at noon, and at bedtime. May substitute to any manufacturer covered by patient's insurance.  Dispense: 1 each; Refill: 0 -     Blood Glucose Test; 1 each by In Vitro route in the morning, at noon, and at bedtime. May substitute to any manufacturer covered by patient's insurance.  Dispense: 100 strip; Refill: 11 -     Lancet Device; 1 each by Does not apply route in the morning, at noon, and at bedtime. May substitute to any manufacturer covered by patient's insurance.  Dispense: 1 each; Refill: 0 -     Lancets Misc.; 1 each by Does not apply route in the morning, at noon, and at bedtime. May substitute to any manufacturer covered by patient's insurance.  Dispense: 100 each; Refill: 0    Follow-up: Return in about 4 months (around 07/02/2024) for diabetes.  Butler Der, M.D.

## 2024-03-03 ENCOUNTER — Telehealth: Payer: Self-pay

## 2024-03-03 LAB — CBC WITH DIFFERENTIAL/PLATELET
Basophils Absolute: 0.1 10*3/uL (ref 0.0–0.2)
Basos: 1 %
EOS (ABSOLUTE): 0.2 10*3/uL (ref 0.0–0.4)
Eos: 4 %
Hematocrit: 28.6 % — ABNORMAL LOW (ref 34.0–46.6)
Hemoglobin: 7.4 g/dL — CL (ref 11.1–15.9)
Immature Grans (Abs): 0 10*3/uL (ref 0.0–0.1)
Immature Granulocytes: 0 %
Lymphocytes Absolute: 1 10*3/uL (ref 0.7–3.1)
Lymphs: 17 %
MCH: 16.6 pg — ABNORMAL LOW (ref 26.6–33.0)
MCHC: 25.9 g/dL — ABNORMAL LOW (ref 31.5–35.7)
MCV: 64 fL — ABNORMAL LOW (ref 79–97)
Monocytes Absolute: 0.3 10*3/uL (ref 0.1–0.9)
Monocytes: 6 %
Neutrophils Absolute: 4 10*3/uL (ref 1.4–7.0)
Neutrophils: 72 %
Platelets: 392 10*3/uL (ref 150–450)
RBC: 4.45 x10E6/uL (ref 3.77–5.28)
RDW: 16.5 % — ABNORMAL HIGH (ref 11.7–15.4)
WBC: 5.6 10*3/uL (ref 3.4–10.8)

## 2024-03-03 LAB — CMP14+EGFR
ALT: 11 IU/L (ref 0–32)
AST: 19 IU/L (ref 0–40)
Albumin: 4.3 g/dL (ref 3.7–4.7)
Alkaline Phosphatase: 131 IU/L — ABNORMAL HIGH (ref 44–121)
BUN/Creatinine Ratio: 14 (ref 12–28)
BUN: 10 mg/dL (ref 8–27)
Bilirubin Total: 0.4 mg/dL (ref 0.0–1.2)
CO2: 21 mmol/L (ref 20–29)
Calcium: 10.1 mg/dL (ref 8.7–10.3)
Chloride: 97 mmol/L (ref 96–106)
Creatinine, Ser: 0.7 mg/dL (ref 0.57–1.00)
Globulin, Total: 2.6 g/dL (ref 1.5–4.5)
Glucose: 137 mg/dL — ABNORMAL HIGH (ref 70–99)
Potassium: 4 mmol/L (ref 3.5–5.2)
Sodium: 135 mmol/L (ref 134–144)
Total Protein: 6.9 g/dL (ref 6.0–8.5)
eGFR: 87 mL/min/{1.73_m2} (ref >59)

## 2024-03-03 LAB — IRON,TIBC AND FERRITIN PANEL
Ferritin: 4 ng/mL — ABNORMAL LOW (ref 15–150)
Iron Saturation: 2 % — CL (ref 15–55)
Iron: 11 ug/dL — ABNORMAL LOW (ref 27–139)
Total Iron Binding Capacity: 494 ug/dL — ABNORMAL HIGH (ref 250–450)
UIBC: 483 ug/dL — ABNORMAL HIGH (ref 118–369)

## 2024-03-03 LAB — TSH+FREE T4
Free T4: 1.12 ng/dL (ref 0.82–1.77)
TSH: 1.89 u[IU]/mL (ref 0.450–4.500)

## 2024-03-03 NOTE — Telephone Encounter (Signed)
 Received a report from access nurse about patients critical iron from 03/02/24- please review lab and advise

## 2024-03-04 ENCOUNTER — Other Ambulatory Visit: Payer: Self-pay | Admitting: Cardiology

## 2024-03-04 ENCOUNTER — Other Ambulatory Visit: Payer: Self-pay | Admitting: Family Medicine

## 2024-03-04 DIAGNOSIS — R110 Nausea: Secondary | ICD-10-CM

## 2024-03-04 DIAGNOSIS — E1169 Type 2 diabetes mellitus with other specified complication: Secondary | ICD-10-CM

## 2024-03-04 DIAGNOSIS — I639 Cerebral infarction, unspecified: Secondary | ICD-10-CM

## 2024-03-05 ENCOUNTER — Encounter (HOSPITAL_COMMUNITY): Payer: Self-pay | Admitting: Interventional Radiology

## 2024-03-08 ENCOUNTER — Other Ambulatory Visit: Payer: Self-pay | Admitting: Family Medicine

## 2024-03-08 DIAGNOSIS — D508 Other iron deficiency anemias: Secondary | ICD-10-CM

## 2024-03-24 ENCOUNTER — Other Ambulatory Visit: Payer: Self-pay | Admitting: Family Medicine

## 2024-03-24 DIAGNOSIS — K219 Gastro-esophageal reflux disease without esophagitis: Secondary | ICD-10-CM

## 2024-04-06 ENCOUNTER — Other Ambulatory Visit (INDEPENDENT_AMBULATORY_CARE_PROVIDER_SITE_OTHER): Payer: Self-pay | Admitting: Gastroenterology

## 2024-04-06 ENCOUNTER — Other Ambulatory Visit: Payer: Self-pay | Admitting: Family Medicine

## 2024-04-06 DIAGNOSIS — K743 Primary biliary cirrhosis: Secondary | ICD-10-CM

## 2024-04-06 NOTE — Telephone Encounter (Signed)
 Can you please schedule patient an appt to further refills? Thank you!

## 2024-04-11 NOTE — Telephone Encounter (Signed)
 Patient aware needed appointment for further refills, and [facility] is calling in for refill request on Ursodiol  to last until patient appointment here on 04/18/2024. Please advise. thanks

## 2024-04-18 ENCOUNTER — Encounter (INDEPENDENT_AMBULATORY_CARE_PROVIDER_SITE_OTHER): Payer: Self-pay | Admitting: Gastroenterology

## 2024-04-18 ENCOUNTER — Telehealth: Payer: Self-pay | Admitting: *Deleted

## 2024-04-18 ENCOUNTER — Ambulatory Visit (INDEPENDENT_AMBULATORY_CARE_PROVIDER_SITE_OTHER): Admitting: Gastroenterology

## 2024-04-18 VITALS — BP 143/72 | HR 102 | Temp 97.8°F | Ht 62.0 in | Wt 162.7 lb

## 2024-04-18 DIAGNOSIS — K743 Primary biliary cirrhosis: Secondary | ICD-10-CM | POA: Diagnosis not present

## 2024-04-18 DIAGNOSIS — D509 Iron deficiency anemia, unspecified: Secondary | ICD-10-CM

## 2024-04-18 DIAGNOSIS — K5900 Constipation, unspecified: Secondary | ICD-10-CM

## 2024-04-18 DIAGNOSIS — K5909 Other constipation: Secondary | ICD-10-CM | POA: Diagnosis not present

## 2024-04-18 DIAGNOSIS — Z1321 Encounter for screening for nutritional disorder: Secondary | ICD-10-CM

## 2024-04-18 MED ORDER — FERROUS SULFATE 325 (65 FE) MG PO TABS: 325.0000 mg | ORAL_TABLET | Freq: Two times a day (BID) | ORAL | 2 refills | Status: DC

## 2024-04-18 NOTE — Patient Instructions (Signed)
-  CBC, CMP, INR, Vitamin D , Vitamin A   -US  liver elastography  -schedule EGD/Colonoscopy -referral to hematology for IDA  -start Iron ferrous sulfate  twice daily -Start taking Miralax 1 capful every day for one week. If bowel movements do not improve, increase to 1 capful every 12 hours. If after two weeks there is no improvement, increase to 1 capful every 8 hours -Increase water  intake, aim for atleast 64 oz per day Increase fruits, veggies and whole grains, kiwi and prunes are especially good for constipation -discuss DEXA scan at next visit  Follow up 2 months  It was a pleasure to see you today. I want to create trusting relationships with patients and provide genuine, compassionate, and quality care. I truly value your feedback! please be on the lookout for a survey regarding your visit with me today. I appreciate your input about our visit and your time in completing this!    Misao Fackrell L. Najmo Pardue, MSN, APRN, AGNP-C Adult-Gerontology Nurse Practitioner [facility] at [facility]

## 2024-04-18 NOTE — H&P (View-Only) (Signed)
 Referring Provider: Zollie Lowers, MD Primary Care Physician:  Zollie Lowers, MD Primary GI Physician:   Chief Complaint  Patient presents with   Follow-up    Patient here today for a follow up on PBC and needing refills on Ursodiol . Patient denies any current gi related issues.    HPI:   Elizabeth [name] is a 83 y.o. 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 having harder stools. Some nausea. Some LLQ pain, taking tramadol  twice per week  Recommended to start miralax, colonoscopy, MELD labs, AFP, INR  Colonoscopy was cancelled   Last labs in June with ALk phos 131, T bili 0.4, AST 19, ALT 11, hgb 7.4 plt 392 iron 11, iron sat 2, ferritin 4, tibc 494 TSh 1.89   Notably she had low iron in march as well with TIBC 495, iron 13, sat 3 and ferritin 6, hgb at that time was 8  Present:  States she is not sure why her blood counts were so low in march/June. Denies any recent blood transfusions though has required them in the past. She denies rectal bleeding or melena. She has been feeling more tired, fatigued, dizzy.  She reports she is not taking iron pills despite notes in the chart from PCP to start Iron BID. States she was unaware she was supposed to start these. endorses some lower abdominal pain at times. Has a BM usually once per week. Doing miralax and prune juice for constipation but usually only once per week. Denies changes in bowel habits. Denies any jaundice, pruritus. No swelling to abdomen, confusion.   Last Colonoscopy:2019 - Six 5 to 15 mm polyps in the ascending colon, removed with a hot snare. Complete resection. Partial  retrieval. - One 8 mm polyp at the hepatic flexure - One small polyp in the ascending colon - One 12 mm polyp at the recto-sigmoid colon - Diverticulosis in the sigmoid colon. Last Endoscopy: 08/2018 - Normal esophagus. - Z-line irregular, 37 cm from the incisors. - 3 cm hiatal hernia. - Normal stomach. - Normal duodenal bulb and second portion of the duodenum. - No endoscopic esophageal abnormality to explain patient's dysphagia. Esophagus dilated. Dilated. - No specimens collected.   Filed Weights   04/18/24 1006  Weight: 162 lb 11.2 oz (73.8 kg)     Past Medical History:  Diagnosis Date   Absolute anemia 07/28/2018   Active smoker    Allergy    Aneurysm (HCC)    Anxiety    Takes Xanax  for anxiety   Arthritis    Asthma    Cataract    Cerebrovascular accident (CVA) due to thrombosis of cerebral artery (HCC) 01/30/2022   Chronic airway obstruction, not elsewhere classified    Clotting disorder (HCC)    stoke    Depression    Diarrhea    Diverticulosis of colon (without mention of hemorrhage)    Esophageal reflux    Family history of malignant neoplasm of gastrointestinal tract    GI bleed    Headache(784.0)    Irregular   Neuromuscular disorder (HCC)    numbness is left hand and left elbow   Obesity, unspecified    Other  and unspecified hyperlipidemia    Personal history of colonic polyps    Stricture and stenosis of esophagus    Stroke (HCC)    Thyroid  disease    Type II or unspecified type diabetes mellitus without mention of complication, not stated as uncontrolled    Unspecified asthma(493.90)    Unspecified essential hypertension     Past Surgical History:  Procedure Laterality Date   APPENDECTOMY     BACK SURGERY     Spinal surgery   BRAIN SURGERY     2013 - stoke ( fall in 2010)    CARDIAC CATHETERIZATION  2006   LAD: 30%, RCA : 20%, normal EF, elevated LVEDP   CARDIAC CATHETERIZATION     CEREBRAL ANGIOGRAM  12/20/2015   COLONOSCOPY N/A  08/09/2018   Procedure: COLONOSCOPY;  Surgeon: Golda Claudis PENNER, MD;  Location: [facility];  Service: Endoscopy;  Laterality: N/A;  1:55   ESOPHAGEAL DILATION N/A 08/09/2018   Procedure: ESOPHAGEAL DILATION;  Surgeon: Golda Claudis PENNER, MD;  Location: [facility];  Service: Endoscopy;  Laterality: N/A;   ESOPHAGOGASTRODUODENOSCOPY N/A 08/09/2018   Procedure: ESOPHAGOGASTRODUODENOSCOPY (EGD);  Surgeon: Golda Claudis PENNER, MD;  Location: [facility];  Service: Endoscopy;  Laterality: N/A;   GIVENS CAPSULE STUDY N/A 08/25/2018   Procedure: GIVENS CAPSULE STUDY;  Surgeon: Golda Claudis PENNER, MD;  [facility]: [facility];  Service: Endoscopy;  Laterality: N/A;   IR ANGIO INTRA EXTRACRAN SEL COM CAROTID INNOMINATE BILAT MOD SED  01/14/2018   IR ANGIO INTRA EXTRACRAN SEL COM CAROTID INNOMINATE BILAT MOD SED  05/11/2018   IR ANGIO VERTEBRAL SEL SUBCLAVIAN INNOMINATE UNI L MOD SED  01/14/2018   IR ANGIO VERTEBRAL SEL SUBCLAVIAN INNOMINATE UNI L MOD SED  05/11/2018   IR ANGIO VERTEBRAL SEL VERTEBRAL UNI R MOD SED  01/14/2018   IR ANGIO VERTEBRAL SEL VERTEBRAL UNI R MOD SED  05/11/2018   KNEE ARTHROSCOPY     left   LUMBAR DISC SURGERY     POLYPECTOMY  08/09/2018   Procedure: POLYPECTOMY;  Surgeon: Golda Claudis PENNER, MD;  Location: [facility];  Service: Endoscopy;;  ascending colon (CS x 2, HS x5) hepatic flexure (HSx1), recto-sigmoid (HSx1)   TONSILLECTOMY     TOTAL ABDOMINAL HYSTERECTOMY     menopause/bleeding    Current Outpatient Medications  Medication Sig Dispense Refill   Accu-Chek Softclix Lancets lancets Test BS in the morning, at noon and at bedtime Dx E11.69 300 each 3   albuterol  (VENTOLIN  HFA) 108 (90 Base) MCG/ACT inhaler INHALE TWO PUFFS BY MOUTH EVERY 6 HOURS AS NEEDED FOR wheezing OR SHORTNESS OF BREATH 8.5 g 2   amLODipine  (NORVASC ) 10 MG tablet TAKE 1 TABLET BY MOUTH EVERY DAY 90 tablet 5   azelastine  (ASTELIN ) 0.1 % nasal spray INSTILL 2 SPRAYS IN EACH NOSTRIL TWICE DAILY 30 mL 2   Blood  Glucose Monitoring Suppl DEVI 1 each by Does not apply route in the morning, at noon, and at bedtime. May substitute to any manufacturer covered by patient's insurance. 1 each 0   chlorpheniramine (ALLERGY RELIEF) 4 MG tablet Take 4 mg by mouth 2 (two) times daily as needed for allergies.     clopidogrel  (PLAVIX ) 75 MG tablet Take 1 tablet (75 mg total) by mouth daily. 90 tablet 1   cyclobenzaprine  (FLEXERIL ) 5 MG tablet TAKE 1 TABLET BY MOUTH AT BEDTIME 30 tablet 0   ezetimibe  (ZETIA ) 10 MG tablet TAKE 1 TABLET BY MOUTH DAILY 90 tablet  3   Glucose Blood (BLOOD GLUCOSE TEST STRIPS) STRP 1 each by In Vitro route in the morning, at noon, and at bedtime. May substitute to any manufacturer covered by patient's insurance. 100 strip 11   Grape Seed OIL by Does not apply route daily at 6 (six) AM.     hydrochlorothiazide  (HYDRODIURIL ) 12.5 MG tablet TAKE 1 TABLET BY MOUTH DAILY 90 tablet 3   ibuprofen  (ADVIL ) 800 MG tablet TAKE 1 TABLET BY MOUTH EVERY 8 HOURS AS NEEDED 90 tablet 1   Lancets Misc. (ACCU-CHEK SOFTCLIX LANCET DEV) KIT Test BS in the morning, at noon and at bedtime Dx E11.69 1 kit 0   meclizine  (ANTIVERT ) 12.5 MG tablet TAKE 1 TABLET BY MOUTH THREE TIMES DAILY AS NEEDED FOR DIZZINESS 15 tablet 0   montelukast  (SINGULAIR ) 10 MG tablet TAKE 1 TABLET BY MOUTH AT BEDTIME 30 tablet 3   ondansetron  (ZOFRAN ) 4 MG tablet Take 1 tablet (4 mg total) by mouth every 6 (six) hours. 60 tablet 1   OVER THE COUNTER MEDICATION daily at 6 (six) AM. brain essense     pantoprazole  (PROTONIX ) 40 MG tablet TAKE 1 TABLET BY MOUTH DAILY BEFORE BREAKFAST 90 tablet 1   REPATHA  SURECLICK 140 MG/ML SOAJ INJECT 1 ML into THE SKIN EVERY 14 DAYS 6 mL 3   Specialty Vitamins Products (COLLAGEN ULTRA PO) Take 60 mg by mouth 1 day or 1 dose. Collagen peptides     UNABLE TO FIND 2 (two) times daily. Med Name: cinnamon, chromium & biotin     UNABLE TO FIND 1 day or 1 dose. Med Name: curamin 1/2 tablet for pain     ursodiol   (ACTIGALL ) 300 MG capsule TAKE ONE CAPSULE BY MOUTH THREE TIMES DAILY 90 capsule 0   Vitamin D , Ergocalciferol , (DRISDOL ) 1.25 MG (50000 UNIT) CAPS capsule TAKE ONE CAPSULE BY MOUTH EVERY 7 DAYS 10 capsule 2   No current facility-administered medications for this visit.    Allergies as of 04/18/2024 - Review Complete 04/18/2024  Allergen Reaction Noted   Banana Anaphylaxis 04/08/2011   Codeine Nausea And Vomiting and Other (See Comments) 08/28/2008   Stevia [stevioside] Swelling and Other (See Comments) 04/02/2016   Actos [pioglitazone] Swelling 12/20/2012   Banana flavoring agent (non-screening)  11/08/2021   Benicar [olmesartan medoxomil] Other (See Comments) 11/14/2011   Benicar [olmesartan]  02/19/2022   Chantix [varenicline] Other (See Comments) 12/20/2012   Diovan [valsartan] Other (See Comments) 11/14/2011   Lipitor [atorvastatin ] Other (See Comments) 12/20/2012   Rice Other (See Comments) 12/02/2021   Spinach Other (See Comments) 02/27/2012   Statins Other (See Comments) 01/30/2022   Vicodin [hydrocodone-acetaminophen ] Nausea And Vomiting 12/20/2012   Zocor [simvastatin] Other (See Comments) 12/20/2012   Ace inhibitors Cough 11/14/2011   Celebrex [celecoxib] Rash 12/20/2012   Latex Rash and Other (See Comments)    Metformin  and related  02/03/2019   Mushroom extract complex (obsolete) Palpitations 12/02/2021   Penicillins Rash and Other (See Comments)    Sulfonamide derivatives Rash     Social History   Socioeconomic History   Marital status: Married    Spouse name: Lamar   Number of children: 0   Years of education: 16   Highest education level: Bachelor's degree (e.g., BA, AB, BS)  Occupational History   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   Tobacco Use   Smoking status: Every Day    Types: E-cigarettes    Passive exposure: Current  Smokeless tobacco: Never  Vaping Use   Vaping status: Every Day   Substances: THC, CBD, Flavoring,  Nicotine -salt, Synthetic cannabinoids, Mixture of cannabinoids, Homemade substance  Substance and Sexual Activity   Alcohol  use: No    Alcohol /week: 0.0 standard drinks of alcohol    Drug use: No   Sexual activity: Not Currently  Other Topics Concern   Not on file  Social History Narrative   Married Lamar   Limited activity due to back pain   Social Drivers of Health   Financial Resource Strain: Low Risk  (01/28/2021)   Overall Financial Resource Strain (CARDIA)    Difficulty of Paying Living Expenses: Not hard at all  Food Insecurity: No Food Insecurity (01/28/2021)   Hunger Vital Sign    Worried About Running Out of Food in the Last Year: Never true    Ran Out of Food in the Last Year: Never true  Transportation Needs: No Transportation Needs (01/28/2021)   PRAPARE - Administrator, Civil Service (Medical): No    Lack of Transportation (Non-Medical): No  Physical Activity: Insufficiently Active (01/28/2021)   Exercise Vital Sign    Days of Exercise per Week: 3 days    Minutes of Exercise per Session: 20 min  Stress: No Stress Concern Present (01/28/2021)   [employer] of Occupational Health - Occupational Stress Questionnaire    Feeling of Stress : Not at all  Social Connections: Unknown (01/06/2022)   Received from [facility]   Social Network    Social Network: Not on file    Review of systems General: negative for malaise, night sweats, fever, chills, weight los Neck: Negative for lumps, goiter, pain and significant neck swelling Resp: Negative for cough, wheezing, dyspnea at rest CV: Negative for chest pain, leg swelling, palpitations, orthopnea GI: denies melena, hematochezia, nausea, vomiting, diarrhea, constipation, dysphagia, odyonophagia, early satiety or unintentional weight loss.  MSK: Negative for joint pain or swelling, back pain, and muscle pain. Derm: Negative for itching or rash Psych: Denies depression, anxiety, memory loss, confusion. No  homicidal or suicidal ideation.  Heme: Negative for prolonged bleeding, bruising easily, and swollen nodes. Endocrine: Negative for cold or heat intolerance, polyuria, polydipsia and goiter. Neuro: negative for tremor, gait imbalance, syncope and seizures. The remainder of the review of systems is noncontributory.  Physical Exam: BP (!) 143/72 (BP Location: Left Arm, Patient Position: Sitting, Cuff Size: Normal)   Pulse (!) 102   Temp 97.8 F (36.6 C) (Temporal)   Ht 5' 2 (1.575 m)   Wt 162 lb 11.2 oz (73.8 kg)   BMI 29.76 kg/m  General:   Alert and oriented. No distress noted. Pleasant and cooperative.  Head:  Normocephalic and atraumatic. Eyes:  Conjuctiva clear without scleral icterus. Mouth:  Oral mucosa pink and moist. Good dentition. No lesions. Heart: Normal rate and rhythm, s1 and s2 heart sounds present.  Lungs: Clear lung sounds in all lobes. Respirations equal and unlabored. Abdomen:  +BS, soft, non-tender and non-distended. No rebound or guarding. No HSM or masses noted. Derm: No palmar erythema or jaundice Msk:  Symmetrical without gross deformities. Normal posture. Extremities:  Without edema. Neurologic:  Alert and  oriented x4 Psych:  Alert and cooperative. Normal mood and affect.  Invalid input(s): 6 MONTHS   ASSESSMENT: Elizabeth [name] is a 83 y.o. female presenting today for follow up of PBC, constipation and acute on chronic IDA  EAR:fjpwujpwzi on Urso , has had relatively adequate response though she was lost to  follow up over the past few years for ongoing management. Her last Alk phos in June was 131 (<1x ULN) with T bili 0.4 (0.6), both within range. She had US  liver elastography in 2023 with low kpa, and low suspicion for cirrhosis at that time though recommended to have repeat imaging again in 2 years. 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 She is due for repeat labs. We will get her scheduled for US   elastography and continue on Urso  300mg  TID. No DEXA scan since 2019 which showed osteopenia, will discuss update of DEXA at follow up, given her other more acute issues at this time. We will update Vitamin D  Levels with labs.   Acute on chronic IDA: history of IDA though most recently with significantly low iron and hemoglobin since march with hgb 7.4, she did not receive any blood transfusions at that time. She is not taking iron pills. She denies rectal bleeding or melena. Feels tired and fatigued. Last EGD/Colonoscopy in 2019. Will update CBC today and advised patient to start PO Iron BID. Will also refer to hematology for possible iron infusions. I recommend we update EGD and Colonoscopy to evaluate for any GI sources of blood loss given significant decline in her blood counts. Indications, risks and benefits of procedure discussed in detail with patient. Patient verbalized understanding and is in agreement to proceed with EGD/Colonoscopy.   In regards to constipation. This is a chronic issue.  I am recommending she titrate up on her miralax as she is only taking this once per week.   PLAN:  -CBC, CMP, INR, Vitamin D , Vitamin A   -US  liver elastography  -schedule EGD/Colonoscopy ASA III (on plavix , needs clearance to hold)  -referral to hematology for IDA/possible iron infusions  -start Iron PO BID  -Start taking Miralax 1 capful every day for one week. If bowel movements do not improve, increase to 1 capful every 12 hours. If after two weeks there is no improvement, increase to 1 capful every 8 hours -Increase water  intake, aim for atleast 64 oz per day Increase fruits, veggies and whole grains, kiwi and prunes are especially good for constipation -discuss DEXA scan at follow up (last was in 2019)   All questions were answered, patient verbalized understanding and is in agreement with plan as outlined above.   Follow Up: 2 months   Elizabeth Selvy L. Mariette, MSN, APRN, AGNP-C Adult-Gerontology  Nurse Practitioner [facility]  I have reviewed the note and agree with the APP's assessment as described in this progress note  Toribio Fortune, MD Gastroenterology and Hepatology [facility]

## 2024-04-18 NOTE — Progress Notes (Signed)
 Referring Provider: Zollie Lowers, MD Primary Care Physician:  Zollie Lowers, MD Primary GI Physician:   Chief Complaint  Patient presents with   Follow-up    Patient here today for a follow up on PBC and needing refills on Ursodiol . Patient denies any current gi related issues.    HPI:   Elizabeth [name] is a 83 y.o. 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 having harder stools. Some nausea. Some LLQ pain, taking tramadol  twice per week  Recommended to start miralax, colonoscopy, MELD labs, AFP, INR  Colonoscopy was cancelled   Last labs in June with ALk phos 131, T bili 0.4, AST 19, ALT 11, hgb 7.4 plt 392 iron 11, iron sat 2, ferritin 4, tibc 494 TSh 1.89   Notably she had low iron in march as well with TIBC 495, iron 13, sat 3 and ferritin 6, hgb at that time was 8  Present:  States she is not sure why her blood counts were so low in march/June. Denies any recent blood transfusions though has required them in the past. She denies rectal bleeding or melena. She has been feeling more tired, fatigued, dizzy.  She reports she is not taking iron pills despite notes in the chart from PCP to start Iron BID. States she was unaware she was supposed to start these. endorses some lower abdominal pain at times. Has a BM usually once per week. Doing miralax and prune juice for constipation but usually only once per week. Denies changes in bowel habits. Denies any jaundice, pruritus. No swelling to abdomen, confusion.   Last Colonoscopy:2019 - Six 5 to 15 mm polyps in the ascending colon, removed with a hot snare. Complete resection. Partial  retrieval. - One 8 mm polyp at the hepatic flexure - One small polyp in the ascending colon - One 12 mm polyp at the recto-sigmoid colon - Diverticulosis in the sigmoid colon. Last Endoscopy: 08/2018 - Normal esophagus. - Z-line irregular, 37 cm from the incisors. - 3 cm hiatal hernia. - Normal stomach. - Normal duodenal bulb and second portion of the duodenum. - No endoscopic esophageal abnormality to explain patient's dysphagia. Esophagus dilated. Dilated. - No specimens collected.   Filed Weights   04/18/24 1006  Weight: 162 lb 11.2 oz (73.8 kg)     Past Medical History:  Diagnosis Date   Absolute anemia 07/28/2018   Active smoker    Allergy    Aneurysm (HCC)    Anxiety    Takes Xanax  for anxiety   Arthritis    Asthma    Cataract    Cerebrovascular accident (CVA) due to thrombosis of cerebral artery (HCC) 01/30/2022   Chronic airway obstruction, not elsewhere classified    Clotting disorder (HCC)    stoke    Depression    Diarrhea    Diverticulosis of colon (without mention of hemorrhage)    Esophageal reflux    Family history of malignant neoplasm of gastrointestinal tract    GI bleed    Headache(784.0)    Irregular   Neuromuscular disorder (HCC)    numbness is left hand and left elbow   Obesity, unspecified    Other  and unspecified hyperlipidemia    Personal history of colonic polyps    Stricture and stenosis of esophagus    Stroke (HCC)    Thyroid  disease    Type II or unspecified type diabetes mellitus without mention of complication, not stated as uncontrolled    Unspecified asthma(493.90)    Unspecified essential hypertension     Past Surgical History:  Procedure Laterality Date   APPENDECTOMY     BACK SURGERY     Spinal surgery   BRAIN SURGERY     2013 - stoke ( fall in 2010)    CARDIAC CATHETERIZATION  2006   LAD: 30%, RCA : 20%, normal EF, elevated LVEDP   CARDIAC CATHETERIZATION     CEREBRAL ANGIOGRAM  12/20/2015   COLONOSCOPY N/A  08/09/2018   Procedure: COLONOSCOPY;  Surgeon: Golda Claudis PENNER, MD;  [facility]: [facility];  Service: Endoscopy;  Laterality: N/A;  1:55   ESOPHAGEAL DILATION N/A 08/09/2018   Procedure: ESOPHAGEAL DILATION;  Surgeon: Golda Claudis PENNER, MD;  Location: [facility];  Service: Endoscopy;  Laterality: N/A;   ESOPHAGOGASTRODUODENOSCOPY N/A 08/09/2018   Procedure: ESOPHAGOGASTRODUODENOSCOPY (EGD);  Surgeon: Golda Claudis PENNER, MD;  [facility]: [facility];  Service: Endoscopy;  Laterality: N/A;   GIVENS CAPSULE STUDY N/A 08/25/2018   Procedure: GIVENS CAPSULE STUDY;  Surgeon: Golda Claudis PENNER, MD;  [facility]: [facility];  Service: Endoscopy;  Laterality: N/A;   IR ANGIO INTRA EXTRACRAN SEL COM CAROTID INNOMINATE BILAT MOD SED  01/14/2018   IR ANGIO INTRA EXTRACRAN SEL COM CAROTID INNOMINATE BILAT MOD SED  05/11/2018   IR ANGIO VERTEBRAL SEL SUBCLAVIAN INNOMINATE UNI L MOD SED  01/14/2018   IR ANGIO VERTEBRAL SEL SUBCLAVIAN INNOMINATE UNI L MOD SED  05/11/2018   IR ANGIO VERTEBRAL SEL VERTEBRAL UNI R MOD SED  01/14/2018   IR ANGIO VERTEBRAL SEL VERTEBRAL UNI R MOD SED  05/11/2018   KNEE ARTHROSCOPY     left   LUMBAR DISC SURGERY     POLYPECTOMY  08/09/2018   Procedure: POLYPECTOMY;  Surgeon: Golda Claudis PENNER, MD;  Location: [facility];  Service: Endoscopy;;  ascending colon (CS x 2, HS x5) hepatic flexure (HSx1), recto-sigmoid (HSx1)   TONSILLECTOMY     TOTAL ABDOMINAL HYSTERECTOMY     menopause/bleeding    Current Outpatient Medications  Medication Sig Dispense Refill   Accu-Chek Softclix Lancets lancets Test BS in the morning, at noon and at bedtime Dx E11.69 300 each 3   albuterol  (VENTOLIN  HFA) 108 (90 Base) MCG/ACT inhaler INHALE TWO PUFFS BY MOUTH EVERY 6 HOURS AS NEEDED FOR wheezing OR SHORTNESS OF BREATH 8.5 g 2   amLODipine  (NORVASC ) 10 MG tablet TAKE 1 TABLET BY MOUTH EVERY DAY 90 tablet 5   azelastine  (ASTELIN ) 0.1 % nasal spray INSTILL 2 SPRAYS IN EACH NOSTRIL TWICE DAILY 30 mL 2   Blood  Glucose Monitoring Suppl DEVI 1 each by Does not apply route in the morning, at noon, and at bedtime. May substitute to any manufacturer covered by patient's insurance. 1 each 0   chlorpheniramine (ALLERGY RELIEF) 4 MG tablet Take 4 mg by mouth 2 (two) times daily as needed for allergies.     clopidogrel  (PLAVIX ) 75 MG tablet Take 1 tablet (75 mg total) by mouth daily. 90 tablet 1   cyclobenzaprine  (FLEXERIL ) 5 MG tablet TAKE 1 TABLET BY MOUTH AT BEDTIME 30 tablet 0   ezetimibe  (ZETIA ) 10 MG tablet TAKE 1 TABLET BY MOUTH DAILY 90 tablet  3   Glucose Blood (BLOOD GLUCOSE TEST STRIPS) STRP 1 each by In Vitro route in the morning, at noon, and at bedtime. May substitute to any manufacturer covered by patient's insurance. 100 strip 11   Grape Seed OIL by Does not apply route daily at 6 (six) AM.     hydrochlorothiazide  (HYDRODIURIL ) 12.5 MG tablet TAKE 1 TABLET BY MOUTH DAILY 90 tablet 3   ibuprofen  (ADVIL ) 800 MG tablet TAKE 1 TABLET BY MOUTH EVERY 8 HOURS AS NEEDED 90 tablet 1   Lancets Misc. (ACCU-CHEK SOFTCLIX LANCET DEV) KIT Test BS in the morning, at noon and at bedtime Dx E11.69 1 kit 0   meclizine  (ANTIVERT ) 12.5 MG tablet TAKE 1 TABLET BY MOUTH THREE TIMES DAILY AS NEEDED FOR DIZZINESS 15 tablet 0   montelukast  (SINGULAIR ) 10 MG tablet TAKE 1 TABLET BY MOUTH AT BEDTIME 30 tablet 3   ondansetron  (ZOFRAN ) 4 MG tablet Take 1 tablet (4 mg total) by mouth every 6 (six) hours. 60 tablet 1   OVER THE COUNTER MEDICATION daily at 6 (six) AM. brain essense     pantoprazole  (PROTONIX ) 40 MG tablet TAKE 1 TABLET BY MOUTH DAILY BEFORE BREAKFAST 90 tablet 1   REPATHA  SURECLICK 140 MG/ML SOAJ INJECT 1 ML into THE SKIN EVERY 14 DAYS 6 mL 3   Specialty Vitamins Products (COLLAGEN ULTRA PO) Take 60 mg by mouth 1 day or 1 dose. Collagen peptides     UNABLE TO FIND 2 (two) times daily. Med Name: cinnamon, chromium & biotin     UNABLE TO FIND 1 day or 1 dose. Med Name: curamin 1/2 tablet for pain     ursodiol   (ACTIGALL ) 300 MG capsule TAKE ONE CAPSULE BY MOUTH THREE TIMES DAILY 90 capsule 0   Vitamin D , Ergocalciferol , (DRISDOL ) 1.25 MG (50000 UNIT) CAPS capsule TAKE ONE CAPSULE BY MOUTH EVERY 7 DAYS 10 capsule 2   No current facility-administered medications for this visit.    Allergies as of 04/18/2024 - Review Complete 04/18/2024  Allergen Reaction Noted   Banana Anaphylaxis 04/08/2011   Codeine Nausea And Vomiting and Other (See Comments) 08/28/2008   Stevia [stevioside] Swelling and Other (See Comments) 04/02/2016   Actos [pioglitazone] Swelling 12/20/2012   Banana flavoring agent (non-screening)  11/08/2021   Benicar [olmesartan medoxomil] Other (See Comments) 11/14/2011   Benicar [olmesartan]  02/19/2022   Chantix [varenicline] Other (See Comments) 12/20/2012   Diovan [valsartan] Other (See Comments) 11/14/2011   Lipitor [atorvastatin ] Other (See Comments) 12/20/2012   Rice Other (See Comments) 12/02/2021   Spinach Other (See Comments) 02/27/2012   Statins Other (See Comments) 01/30/2022   Vicodin [hydrocodone-acetaminophen ] Nausea And Vomiting 12/20/2012   Zocor [simvastatin] Other (See Comments) 12/20/2012   Ace inhibitors Cough 11/14/2011   Celebrex [celecoxib] Rash 12/20/2012   Latex Rash and Other (See Comments)    Metformin  and related  02/03/2019   Mushroom extract complex (obsolete) Palpitations 12/02/2021   Penicillins Rash and Other (See Comments)    Sulfonamide derivatives Rash     Social History   Socioeconomic History   Marital status: Married    Spouse name: Lamar   Number of children: 0   Years of education: 16   Highest education level: Bachelor's degree (e.g., BA, AB, BS)  Occupational History   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   Tobacco Use   Smoking status: Every Day    Types: E-cigarettes    Passive exposure: Current  Smokeless tobacco: Never  Vaping Use   Vaping status: Every Day   Substances: THC, CBD, Flavoring,  Nicotine -salt, Synthetic cannabinoids, Mixture of cannabinoids, Homemade substance  Substance and Sexual Activity   Alcohol  use: No    Alcohol /week: 0.0 standard drinks of alcohol    Drug use: No   Sexual activity: Not Currently  Other Topics Concern   Not on file  Social History Narrative   Married Lamar   Limited activity due to back pain   Social Drivers of Health   Financial Resource Strain: Low Risk  (01/28/2021)   Overall Financial Resource Strain (CARDIA)    Difficulty of Paying Living Expenses: Not hard at all  Food Insecurity: No Food Insecurity (01/28/2021)   Hunger Vital Sign    Worried About Running Out of Food in the Last Year: Never true    Ran Out of Food in the Last Year: Never true  Transportation Needs: No Transportation Needs (01/28/2021)   PRAPARE - Administrator, Civil Service (Medical): No    Lack of Transportation (Non-Medical): No  Physical Activity: Insufficiently Active (01/28/2021)   Exercise Vital Sign    Days of Exercise per Week: 3 days    Minutes of Exercise per Session: 20 min  Stress: No Stress Concern Present (01/28/2021)   [employer] of Occupational Health - Occupational Stress Questionnaire    Feeling of Stress : Not at all  Social Connections: Unknown (01/06/2022)   Received from [facility]   Social Network    Social Network: Not on file    Review of systems General: negative for malaise, night sweats, fever, chills, weight los Neck: Negative for lumps, goiter, pain and significant neck swelling Resp: Negative for cough, wheezing, dyspnea at rest CV: Negative for chest pain, leg swelling, palpitations, orthopnea GI: denies melena, hematochezia, nausea, vomiting, diarrhea, constipation, dysphagia, odyonophagia, early satiety or unintentional weight loss.  MSK: Negative for joint pain or swelling, back pain, and muscle pain. Derm: Negative for itching or rash Psych: Denies depression, anxiety, memory loss, confusion. No  homicidal or suicidal ideation.  Heme: Negative for prolonged bleeding, bruising easily, and swollen nodes. Endocrine: Negative for cold or heat intolerance, polyuria, polydipsia and goiter. Neuro: negative for tremor, gait imbalance, syncope and seizures. The remainder of the review of systems is noncontributory.  Physical Exam: BP (!) 143/72 (BP Location: Left Arm, Patient Position: Sitting, Cuff Size: Normal)   Pulse (!) 102   Temp 97.8 F (36.6 C) (Temporal)   Ht 5' 2 (1.575 m)   Wt 162 lb 11.2 oz (73.8 kg)   BMI 29.76 kg/m  General:   Alert and oriented. No distress noted. Pleasant and cooperative.  Head:  Normocephalic and atraumatic. Eyes:  Conjuctiva clear without scleral icterus. Mouth:  Oral mucosa pink and moist. Good dentition. No lesions. Heart: Normal rate and rhythm, s1 and s2 heart sounds present.  Lungs: Clear lung sounds in all lobes. Respirations equal and unlabored. Abdomen:  +BS, soft, non-tender and non-distended. No rebound or guarding. No HSM or masses noted. Derm: No palmar erythema or jaundice Msk:  Symmetrical without gross deformities. Normal posture. Extremities:  Without edema. Neurologic:  Alert and  oriented x4 Psych:  Alert and cooperative. Normal mood and affect.  Invalid input(s): 6 MONTHS   ASSESSMENT: Elizabeth [name] is a 83 y.o. female presenting today for follow up of PBC, constipation and acute on chronic IDA  EAR:fjpwujpwzi on Urso , has had relatively adequate response though she was lost to  follow up over the past few years for ongoing management. Her last Alk phos in June was 131 (<1x ULN) with T bili 0.4 (0.6), both within range. She had US  liver elastography in 2023 with low kpa, and low suspicion for cirrhosis at that time though recommended to have repeat imaging again in 2 years. 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 She is due for repeat labs. We will get her scheduled for US   elastography and continue on Urso  300mg  TID. No DEXA scan since 2019 which showed osteopenia, will discuss update of DEXA at follow up, given her other more acute issues at this time. We will update Vitamin D  Levels with labs.   Acute on chronic IDA: history of IDA though most recently with significantly low iron and hemoglobin since march with hgb 7.4, she did not receive any blood transfusions at that time. She is not taking iron pills. She denies rectal bleeding or melena. Feels tired and fatigued. Last EGD/Colonoscopy in 2019. Will update CBC today and advised patient to start PO Iron BID. Will also refer to hematology for possible iron infusions. I recommend we update EGD and Colonoscopy to evaluate for any GI sources of blood loss given significant decline in her blood counts. Indications, risks and benefits of procedure discussed in detail with patient. Patient verbalized understanding and is in agreement to proceed with EGD/Colonoscopy.   In regards to constipation. This is a chronic issue.  I am recommending she titrate up on her miralax as she is only taking this once per week.   PLAN:  -CBC, CMP, INR, Vitamin D , Vitamin A   -US  liver elastography  -schedule EGD/Colonoscopy ASA III (on plavix , needs clearance to hold)  -referral to hematology for IDA/possible iron infusions  -start Iron PO BID  -Start taking Miralax 1 capful every day for one week. If bowel movements do not improve, increase to 1 capful every 12 hours. If after two weeks there is no improvement, increase to 1 capful every 8 hours -Increase water  intake, aim for atleast 64 oz per day Increase fruits, veggies and whole grains, kiwi and prunes are especially good for constipation -discuss DEXA scan at follow up (last was in 2019)   All questions were answered, patient verbalized understanding and is in agreement with plan as outlined above.   Follow Up: 2 months   Weslee Fogg L. Mariette, MSN, APRN, AGNP-C Adult-Gerontology  Nurse Practitioner [facility]  I have reviewed the note and agree with the APP's assessment as described in this progress note  Toribio Fortune, MD Gastroenterology and Hepatology [facility]

## 2024-04-18 NOTE — Telephone Encounter (Signed)
 Okay for her to hold Plavix /clopidogrel  for 5 days prior to colonoscopy EGD etc.

## 2024-04-18 NOTE — Telephone Encounter (Signed)
 Patient needing clearance to hold plavix  x 5 days before procedure.  What type of surgery is being performed? COLONOSCOPY/EGD  When is surgery scheduled? TBD  What type of clearance is required (medical or pharmacy to hold medication or both? Medication  Are there any medications that need to be held prior to surgery and how long? Plavix  x 5 days prior  Name of physician performing surgery?  Dr. Eartha Rouse [facility] at [facility] Phone: [phone] Fax: [phone]  Anethesia type (none, local, MAC, general)? MAC

## 2024-04-19 NOTE — Telephone Encounter (Signed)
 Spoke to patient and faxed documentation to Dr. Samuel office

## 2024-04-20 ENCOUNTER — Emergency Department (HOSPITAL_COMMUNITY)
Admission: EM | Admit: 2024-04-20 | Discharge: 2024-04-20 | Disposition: A | Discharge status: Home / Self Care | Source: Ambulatory Visit | Attending: Emergency Medicine | Admitting: Emergency Medicine

## 2024-04-20 ENCOUNTER — Other Ambulatory Visit: Payer: Self-pay

## 2024-04-20 ENCOUNTER — Encounter (HOSPITAL_COMMUNITY): Payer: Self-pay

## 2024-04-20 ENCOUNTER — Ambulatory Visit (INDEPENDENT_AMBULATORY_CARE_PROVIDER_SITE_OTHER): Payer: Self-pay | Admitting: Gastroenterology

## 2024-04-20 DIAGNOSIS — Z91040 Latex allergy status: Secondary | ICD-10-CM | POA: Insufficient documentation

## 2024-04-20 DIAGNOSIS — D649 Anemia, unspecified: Secondary | ICD-10-CM | POA: Diagnosis present

## 2024-04-20 DIAGNOSIS — Z7902 Long term (current) use of antithrombotics/antiplatelets: Secondary | ICD-10-CM | POA: Diagnosis not present

## 2024-04-20 LAB — COMPREHENSIVE METABOLIC PANEL WITH GFR
ALT: 13 U/L (ref 0–44)
AST: 21 U/L (ref 15–41)
Albumin: 3.9 g/dL (ref 3.5–5.0)
Alkaline Phosphatase: 103 U/L (ref 38–126)
Anion gap: 14 (ref 5–15)
BUN: 11 mg/dL (ref 8–23)
CO2: 23 mmol/L (ref 22–32)
Calcium: 9.8 mg/dL (ref 8.9–10.3)
Chloride: 98 mmol/L (ref 98–111)
Creatinine, Ser: 0.72 mg/dL (ref 0.44–1.00)
GFR, Estimated: >60 mL/min (ref >60)
Glucose, Bld: 171 mg/dL — ABNORMAL HIGH (ref 70–99)
Potassium: 3.3 mmol/L — ABNORMAL LOW (ref 3.5–5.1)
Sodium: 135 mmol/L (ref 135–145)
Total Bilirubin: 0.7 mg/dL (ref 0.0–1.2)
Total Protein: 7 g/dL (ref 6.5–8.1)

## 2024-04-20 LAB — CBC WITH DIFFERENTIAL/PLATELET
Abs Immature Granulocytes: 0.01 K/uL (ref 0.00–0.07)
Basophils Absolute: 0 K/uL (ref 0.0–0.1)
Basophils Relative: 1 %
Eosinophils Absolute: 0.2 K/uL (ref 0.0–0.5)
Eosinophils Relative: 4 %
HCT: 24.2 % — ABNORMAL LOW (ref 36.0–46.0)
Hemoglobin: 6.6 g/dL — CL (ref 12.0–15.0)
Immature Granulocytes: 0 %
Lymphocytes Relative: 21 %
Lymphs Abs: 1.1 K/uL (ref 0.7–4.0)
MCH: 16.4 pg — ABNORMAL LOW (ref 26.0–34.0)
MCHC: 27.3 g/dL — ABNORMAL LOW (ref 30.0–36.0)
MCV: 60.2 fL — ABNORMAL LOW (ref 80.0–100.0)
Monocytes Absolute: 0.4 K/uL (ref 0.1–1.0)
Monocytes Relative: 7 %
Neutro Abs: 3.4 K/uL (ref 1.7–7.7)
Neutrophils Relative %: 67 %
Platelets: 363 K/uL (ref 150–400)
RBC: 4.02 MIL/uL (ref 3.87–5.11)
RDW: 19.6 % — ABNORMAL HIGH (ref 11.5–15.5)
Smear Review: NORMAL
WBC: 5 K/uL (ref 4.0–10.5)
nRBC: 0 % (ref 0.0–0.2)

## 2024-04-20 LAB — PREPARE RBC (CROSSMATCH)

## 2024-04-20 MED ORDER — SODIUM CHLORIDE 0.9% IV SOLUTION: Freq: Once | INTRAVENOUS | Status: AC

## 2024-04-20 NOTE — ED Triage Notes (Signed)
 Pt reports she was told to come to ER for low HGB and needs a transfusion.  Hgb 6.3 on 8/11.

## 2024-04-20 NOTE — Discharge Instructions (Signed)
 I want you to follow-up with the oncology team, these are the people that take care of low blood counts, they will need to see you this week.  You will need to have your blood rechecked within 1 week.  We gave you 1 unit of blood, return for severe or worsening symptoms

## 2024-04-20 NOTE — ED Notes (Signed)
Pt assisted to bathroom with wheelchair.

## 2024-04-20 NOTE — ED Notes (Signed)
 Patient Alert and oriented to baseline. Stable and ambulatory to baseline. Patient verbalized understanding of the discharge instructions.  Patient belongings were taken by the patient.

## 2024-04-20 NOTE — ED Provider Notes (Signed)
 [facility] Provider Note   CSN: [id number] Arrival date & time: 04/20/24  1421     Patient presents with: Abnormal Lab   Elizabeth [name] is a 83 y.o. female.    Abnormal Lab  This patient is an 83 year old female with a history of anemia, she is followed by gastroenterology, she was advised to come to the hospital today because of low blood levels.  She has a history of primary biliary cholangitis, iron deficiency anemia chronic constipation, she was seen on August 11, had a hemoglobin of 7.4 in June has not had any blood transfusions though she states she has had them in the past denies any rectal bleeding and is not taking the iron pills despite being told by her PCP to start iron, colonoscopy from 2019 showed multiple polyps, presence of diverticulosis in the sigmoid and endoscopy from that same timeframe showed normal esophagus normal stomach, normal duodenal bulb she had an esophageal dilation at that time.  Blood was drawn on the 11th and showed a hemoglobin of 6.3 compared to 7.4 the month prior compared to 10.610 months ago.  Metabolic panel showed preserved renal function electrolytes and liver function at that time including a albumin of 4.2  The patient states that she walks with a walker, she does not feel any different than she usually feels.  No near syncope no lightheadedness, no progressive weakness    Prior to Admission medications   Medication Sig Start Date End Date Taking? Authorizing Provider  albuterol  (VENTOLIN  HFA) 108 (90 Base) MCG/ACT inhaler INHALE TWO PUFFS BY MOUTH EVERY 6 HOURS AS NEEDED FOR wheezing OR SHORTNESS OF BREATH 12/23/23  Yes Zarwolo, Gloria, FNP  amLODipine  (NORVASC ) 10 MG tablet TAKE 1 TABLET BY MOUTH EVERY DAY 11/05/23  Yes Zarwolo, Gloria, FNP  azelastine  (ASTELIN ) 0.1 % nasal spray INSTILL 2 SPRAYS IN EACH NOSTRIL TWICE DAILY 08/27/23  Yes Zarwolo, Gloria, FNP  chlorpheniramine (ALLERGY RELIEF) 4 MG  tablet Take 4 mg by mouth 2 (two) times daily as needed for allergies.   Yes [provider]  clopidogrel  (PLAVIX ) 75 MG tablet Take 1 tablet (75 mg total) by mouth daily. 10/19/23  Yes Zarwolo, Gloria, FNP  cyclobenzaprine  (FLEXERIL ) 5 MG tablet TAKE 1 TABLET BY MOUTH AT BEDTIME 02/24/24  Yes Joesph Annabella HERO, FNP  ezetimibe  (ZETIA ) 10 MG tablet TAKE 1 TABLET BY MOUTH DAILY 04/30/23  Yes Zarwolo, Gloria, FNP  Grape Seed OIL Take 1 tablet by mouth daily at 6 (six) AM.   Yes [provider]  hydrochlorothiazide  (HYDRODIURIL ) 12.5 MG tablet TAKE 1 TABLET BY MOUTH DAILY 11/16/23  Yes Zarwolo, Gloria, FNP  montelukast  (SINGULAIR ) 10 MG tablet TAKE 1 TABLET BY MOUTH AT BEDTIME 02/24/24  Yes Zarwolo, Gloria, FNP  ondansetron  (ZOFRAN ) 4 MG tablet Take 1 tablet (4 mg total) by mouth every 6 (six) hours. Patient taking differently: Take 4 mg by mouth every 8 (eight) hours as needed for nausea or vomiting. 10/19/23  Yes Zarwolo, Gloria, FNP  OVER THE COUNTER MEDICATION Take 1 capsule by mouth daily at 6 (six) AM. Brain essense   Yes [provider]  pantoprazole  (PROTONIX ) 40 MG tablet TAKE 1 TABLET BY MOUTH DAILY BEFORE BREAKFAST 03/24/24  Yes Zarwolo, Gloria, FNP  REPATHA  SURECLICK 140 MG/ML SOAJ INJECT 1 ML into THE SKIN EVERY 14 DAYS 03/04/24  Yes Alvan Dorn FALCON, MD  Specialty Vitamins Products (COLLAGEN ULTRA PO) Take 60 mg by mouth 1 day or 1  dose. Collagen peptides   Yes [provider]  UNABLE TO FIND Take 1 capsule by mouth 2 (two) times daily. Cinnamon, Chromium & Biotin   Yes [provider]  UNABLE TO FIND Take 0.5 tablets by mouth daily. Curamin 1/2 tablet for pain   Yes [provider]  ursodiol  (ACTIGALL ) 300 MG capsule TAKE ONE CAPSULE BY MOUTH THREE TIMES DAILY 04/11/24  Yes Carlan, Chelsea L, NP  Vitamin D , Ergocalciferol , (DRISDOL ) 1.25 MG (50000 UNIT) CAPS capsule TAKE ONE CAPSULE BY MOUTH EVERY 7 DAYS Patient taking differently: Take 50,000  Units by mouth every Monday. 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 May substitute to any manufacturer covered by patient's insurance. 03/02/24   Zollie Lowers, MD  Glucose Blood (BLOOD GLUCOSE TEST STRIPS) STRP 1 each by In Vitro route in the morning, at noon, and at bedtime. May substitute to any manufacturer covered by patient's insurance. 03/02/24 04/06/25  Zollie Lowers, MD  Lancets Misc. 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   Updated Vital Signs BP (!) 106/95   Pulse 83   Temp 98.1 F (36.7 C) (Oral)   Resp 18   Ht 1.575 m (5' 2)   Wt 73.8 kg   SpO2 97%   BMI 29.76 kg/m   Physical Exam Vitals and nursing note reviewed.  Constitutional:      General: She is not in acute distress.    Appearance: She is well-developed.  HENT:     Head: Normocephalic and atraumatic.     Mouth/Throat:     Pharynx: No oropharyngeal exudate.  Eyes:     General: No scleral icterus.       Right eye: No discharge.        Left eye: No discharge.     Conjunctiva/sclera: Conjunctivae normal.     Pupils: Pupils are equal, round, and reactive to light.  Neck:     Thyroid : No thyromegaly.      Vascular: No JVD.  Cardiovascular:     Rate and Rhythm: Normal rate and regular rhythm.     Heart sounds: Normal heart sounds. No murmur heard.    No friction rub. No gallop.  Pulmonary:     Effort: Pulmonary effort is normal. No respiratory distress.     Breath sounds: Normal breath sounds. No wheezing or rales.  Abdominal:     General: Bowel sounds are normal. There is no distension.     Palpations: Abdomen is soft. There is no mass.     Tenderness: There is no abdominal tenderness.  Musculoskeletal:        General: No tenderness. Normal range of motion.     Cervical back: Normal range of motion and neck supple.     Right lower leg: No edema.     Left lower  leg: No edema.  Lymphadenopathy:     Cervical: No cervical adenopathy.  Skin:    General: Skin is warm and dry.     Findings: No erythema or rash.  Neurological:     Mental Status: She is alert.     Coordination: Coordination normal.  Psychiatric:        Behavior: Behavior normal.     (all labs ordered are listed, but only abnormal results are displayed) Labs Reviewed  CBC WITH DIFFERENTIAL/PLATELET - Abnormal; Notable for the following components:      Result Value   Hemoglobin 6.6 (*)    HCT 24.2 (*)    MCV 60.2 (*)    MCH 16.4 (*)    MCHC 27.3 (*)    RDW 19.6 (*)    All other components within normal limits  COMPREHENSIVE METABOLIC PANEL WITH GFR - Abnormal; Notable for the following components:   Potassium 3.3 (*)    Glucose, Bld 171 (*)    All other components within normal limits  POC OCCULT BLOOD, ED  TYPE AND SCREEN  PREPARE RBC (CROSSMATCH)    EKG: None  Radiology: No results found.   Procedures   Medications Ordered in the ED  0.9 %  sodium chloride  infusion (Manually program via Guardrails IV Fluids) ( Intravenous New Bag/Given 04/20/24 1717)    Clinical Course as of 04/20/24 1847  Wed Apr 20, 2024  1631 Rectal exam with negative Hemoccult, yellowish stool in the vault [BM]    Clinical  Course User Index [BM] Cleotilde Rogue, MD                                 Medical Decision Making Amount and/or Complexity of Data Reviewed Labs: ordered.  Risk Prescription drug management.   Mildly pale conjunctivo but otherwise patient is relatively asymptomatic.  Needs more of an anemia workup at this point and likely needs to see hematology in the outpatient setting, if she is not actively having gastrointestinal bleeding and lab work is otherwise reassuring except for the hemoglobin we can give a transfusion and refer outpatient to hematology.   This patient presents to the ED for concern of anemia differential diagnosis includes bleed, marrow dysfunction, unlikely to be hemolytic    Additional history obtained   Additional history obtained from Electronic Medical Record External records from outside source obtained and reviewed including medical record including gastroenterology notes   Lab Tests:  I Ordered, and personally interpreted labs.  The pertinent results include: CBC shows ongoing anemia   Medicines ordered and prescription drug management:  I ordered medication including blood transfusion    I have reviewed the patients home medicines and have made adjustments as needed   Problem List / ED Course:  Patient hemodynamically stable, received 1 unit of blood,   Social Determinants of Health:  Informed of results, can follow-up with hematology outpatient.  She does not appear unstable        Final diagnoses:  Symptomatic anemia    ED Discharge Orders     None          Cleotilde Rogue, MD 04/20/24 [id number]

## 2024-04-21 LAB — TYPE AND SCREEN
ABO/RH(D): O NEG
Antibody Screen: NEGATIVE
Unit division: 0

## 2024-04-21 LAB — BPAM RBC
Blood Product Expiration Date: 202509182359
ISSUE DATE / TIME: 202508131708
Unit Type and Rh: 9500

## 2024-04-22 LAB — COMPREHENSIVE METABOLIC PANEL WITH GFR
AG Ratio: 1.8 (calc) (ref 1.0–2.5)
ALT: 11 U/L (ref 6–29)
AST: 17 U/L (ref 10–35)
Albumin: 4.2 g/dL (ref 3.6–5.1)
Alkaline phosphatase (APISO): 98 U/L (ref 37–153)
BUN: 13 mg/dL (ref 7–25)
CO2: 27 mmol/L (ref 20–32)
Calcium: 10 mg/dL (ref 8.6–10.4)
Chloride: 99 mmol/L (ref 98–110)
Creat: 0.71 mg/dL (ref 0.60–0.95)
Globulin: 2.4 g/dL (ref 1.9–3.7)
Glucose, Bld: 121 mg/dL — ABNORMAL HIGH (ref 65–99)
Potassium: 3.9 mmol/L (ref 3.5–5.3)
Sodium: 136 mmol/L (ref 135–146)
Total Bilirubin: 0.5 mg/dL (ref 0.2–1.2)
Total Protein: 6.6 g/dL (ref 6.1–8.1)
eGFR: 85 mL/min/1.73m2 (ref >=60)

## 2024-04-22 LAB — CBC
HCT: 24.6 % — ABNORMAL LOW (ref 35.0–45.0)
Hemoglobin: 6.3 g/dL — ABNORMAL LOW (ref 11.7–15.5)
MCH: 15.9 pg — ABNORMAL LOW (ref 27.0–33.0)
MCHC: 25.6 g/dL — ABNORMAL LOW (ref 32.0–36.0)
MCV: 62 fL — ABNORMAL LOW (ref 80.0–100.0)
MPV: 10.3 fL (ref 7.5–12.5)
Platelets: 374 Thousand/uL (ref 140–400)
RBC: 3.97 Million/uL (ref 3.80–5.10)
RDW: 16.9 % — ABNORMAL HIGH (ref 11.0–15.0)
WBC: 6.4 Thousand/uL (ref 3.8–10.8)

## 2024-04-22 LAB — PROTIME-INR
INR: 1.2 — ABNORMAL HIGH
Prothrombin Time: 12.5 s — ABNORMAL HIGH (ref 9.0–11.5)

## 2024-04-22 LAB — VITAMIN A: Vitamin A (Retinoic Acid): 44 ug/dL (ref 38–98)

## 2024-04-22 LAB — VITAMIN D 25 HYDROXY (VIT D DEFICIENCY, FRACTURES): Vit D, 25-Hydroxy: 83 ng/mL (ref 30–100)

## 2024-04-24 ENCOUNTER — Other Ambulatory Visit: Payer: Self-pay | Admitting: Family Medicine

## 2024-04-24 DIAGNOSIS — E559 Vitamin D deficiency, unspecified: Secondary | ICD-10-CM

## 2024-04-26 ENCOUNTER — Other Ambulatory Visit: Payer: Self-pay | Admitting: Family Medicine

## 2024-04-26 DIAGNOSIS — E1169 Type 2 diabetes mellitus with other specified complication: Secondary | ICD-10-CM

## 2024-04-26 MED ORDER — PEG 3350-KCL-NA BICARB-NACL 420 G PO SOLR: 4000.0000 mL | Freq: Once | ORAL | 0 refills | Status: AC

## 2024-04-26 NOTE — Telephone Encounter (Addendum)
 Spoke with pt. She has been scheduled for TCS/EGD with Dr. Eartha 9/2. Aware she needs to hold plavix  x 5 days prior. Will call back with pre-op appt. Instructions mailed. Rx sent to pharmacy  Aware of US  appt. Letter mailed also

## 2024-04-26 NOTE — Telephone Encounter (Signed)
 Called pt and spoke with spouse. Aware of pre-op appt detailsl

## 2024-04-26 NOTE — Addendum Note (Signed)
 Addended by: JEANELL GRAEME RAMAN on: 04/26/2024 08:40 AM   Modules accepted: Orders

## 2024-05-03 ENCOUNTER — Other Ambulatory Visit: Payer: Self-pay | Admitting: Family Medicine

## 2024-05-03 ENCOUNTER — Other Ambulatory Visit (INDEPENDENT_AMBULATORY_CARE_PROVIDER_SITE_OTHER): Payer: Self-pay | Admitting: Gastroenterology

## 2024-05-03 DIAGNOSIS — K743 Primary biliary cirrhosis: Secondary | ICD-10-CM

## 2024-05-03 DIAGNOSIS — E559 Vitamin D deficiency, unspecified: Secondary | ICD-10-CM

## 2024-05-04 NOTE — Patient Instructions (Signed)
 Elizabeth [name]  05/04/2024     @PREFPERIOPPHARMACY @   Your procedure is scheduled on  05/10/2024.   Report to [facility] at  0800 A.M.    Call this number if you have problems the morning of surgery:  [phone]  If you experience any cold or flu symptoms such as cough, fever, chills, shortness of breath, etc. between now and your scheduled surgery, please notify us  at the above number.   Remember:        Your last dose of plavix  should be on 05/04/2024.        DO NOT take any medications for diabetes the morning of your procedure.        Use your inhaler before you come and bring your rescue inhaler with you.   Follow the diet and prep instructions given to you by the office.   You may drink clear liquids until 0600 am on 05/10/2024.         Clear liquids allowed are:                    Water , Juice (No red color; non-citric and without pulp; diabetics please choose diet or no sugar options), Carbonated beverages (diabetics please choose diet or no sugar options), Clear Tea (No creamer, milk, or cream, including half & half and powdered creamer), Black Coffee Only (No creamer, milk or cream, including half & half and powdered creamer), and Clear Sports drink (No red color; diabetics please choose diet or no sugar options)    Take these medicines the morning of surgery with A SIP OF WATER        amlodipine , pantoprazole , ursodiol , zofran  (if needed).    Do not wear jewelry, make-up or nail polish, including gel polish,  artificial nails, or any other type of covering on natural nails (fingers and  toes).  Do not wear lotions, powders, or perfumes, or deodorant.  Do not shave 48 hours prior to surgery.  Men may shave face and neck.  Do not bring valuables to the hospital.  [facility] is not responsible for any belongings or valuables.  Contacts, dentures or bridgework may not be worn into surgery.  Leave your suitcase in the car.  After surgery it may be brought to your  room.  For patients admitted to the hospital, discharge time will be determined by your treatment team.  Patients discharged the day of surgery will not be allowed to drive home and must have someone with them for 24 hours.    Special instructions:   DO NOT smoke tobacco or vape for 24 hours before your procedure.  Please read over the following fact sheets that you were given. Anesthesia Post-op Instructions and Care and Recovery After Surgery      Upper Endoscopy, Adult, Care After After the procedure, it is common to have a sore throat. It is also common to have: Mild stomach pain or discomfort. Bloating. Nausea. Follow these instructions at home: The instructions below may help you care for yourself at home. Your health care provider may give you more instructions. If you have questions, ask your health care provider. If you were given a sedative during the procedure, it can affect you for several hours. Do not drive or operate machinery until your health care provider says that it is safe. If you will be going home right after the procedure, plan to have a responsible adult: Take you home from the hospital  or clinic. You will not be allowed to drive. Care for you for the time you are told. Follow instructions from your health care provider about what you may eat and drink. Return to your normal activities as told by your health care provider. Ask your health care provider what activities are safe for you. Take over-the-counter and prescription medicines only as told by your health care provider. Contact a health care provider if you: Have a sore throat that lasts longer than one day. Have trouble swallowing. Have a fever. Get help right away if you: Vomit blood or your vomit looks like coffee grounds. Have bloody, black, or tarry stools. Have a very bad sore throat or you cannot swallow. Have difficulty breathing or very bad pain in your chest or abdomen. These symptoms may  be an emergency. Get help right away. Call 911. Do not wait to see if the symptoms will go away. Do not drive yourself to the hospital. Summary After the procedure, it is common to have a sore throat, mild stomach discomfort, bloating, and nausea. If you were given a sedative during the procedure, it can affect you for several hours. Do not drive until your health care provider says that it is safe. Follow instructions from your health care provider about what you may eat and drink. Return to your normal activities as told by your health care provider. This information is not intended to replace advice given to you by your health care provider. Make sure you discuss any questions you have with your health care provider. Document Revised: 12/04/2021 Document Reviewed: 12/04/2021 Elsevier Patient Education  2024 [employer]Colonoscopy, Adult, Care After The following information offers guidance on how to care for yourself after your procedure. Your health care provider may also give you more specific instructions. If you have problems or questions, contact your health care provider. What can I expect after the procedure? After the procedure, it is common to have: A small amount of blood in your stool for 24 hours after the procedure. Some gas. Mild cramping or bloating of your abdomen. Follow these instructions at home: Eating and drinking  Drink enough fluid to keep your urine pale yellow. Follow instructions from your health care provider about eating or drinking restrictions. Resume your normal diet as told by your health care provider. Avoid heavy or fried foods that are hard to digest. Activity Rest as told by your health care provider. Avoid sitting for a long time without moving. Get up to take short walks every 1-2 hours. This is important to improve blood flow and breathing. Ask for help if you feel weak or unsteady. Return to your normal activities as told by your health care  provider. Ask your health care provider what activities are safe for you. Managing cramping and bloating  Try walking around when you have cramps or feel bloated. If directed, apply heat to your abdomen as told by your health care provider. Use the heat source that your health care provider recommends, such as a moist heat pack or a heating pad. Place a towel between your skin and the heat source. Leave the heat on for 20-30 minutes. Remove the heat if your skin turns bright red. This is especially important if you are unable to feel pain, heat, or cold. You have a greater risk of getting burned. General instructions If you were given a sedative during the procedure, it can affect you for several hours. Do not drive or operate machinery until your health  care provider says that it is safe. For the first 24 hours after the procedure: Do not sign important documents. Do not drink alcohol . Do your regular daily activities at a slower pace than normal. Eat soft foods that are easy to digest. Take over-the-counter and prescription medicines only as told by your health care provider. Keep all follow-up visits. This is important. Contact a health care provider if: You have blood in your stool 2-3 days after the procedure. Get help right away if: You have more than a small spotting of blood in your stool. You have large blood clots in your stool. You have swelling of your abdomen. You have nausea or vomiting. You have a fever. You have increasing pain in your abdomen that is not relieved with medicine. These symptoms may be an emergency. Get help right away. Call 911. Do not wait to see if the symptoms will go away. Do not drive yourself to the hospital. Summary After the procedure, it is common to have a small amount of blood in your stool. You may also have mild cramping and bloating of your abdomen. If you were given a sedative during the procedure, it can affect you for several hours. Do  not drive or operate machinery until your health care provider says that it is safe. Get help right away if you have a lot of blood in your stool, nausea or vomiting, a fever, or increased pain in your abdomen. This information is not intended to replace advice given to you by your health care provider. Make sure you discuss any questions you have with your health care provider. Document Revised: 10/07/2022 Document Reviewed: 04/17/2021 Elsevier Patient Education  2024 [employer].General Anesthesia, Adult, Care After The following information offers guidance on how to care for yourself after your procedure. Your health care provider may also give you more specific instructions. If you have problems or questions, contact your health care provider. What can I expect after the procedure? After the procedure, it is common for people to: Have pain or discomfort at the IV site. Have nausea or vomiting. Have a sore throat or hoarseness. Have trouble concentrating. Feel cold or chills. Feel weak, sleepy, or tired (fatigue). Have soreness and body aches. These can affect parts of the body that were not involved in surgery. Follow these instructions at home: For the time period you were told by your health care provider:  Rest. Do not participate in activities where you could fall or become injured. Do not drive or use machinery. Do not drink alcohol . Do not take sleeping pills or medicines that cause drowsiness. Do not make important decisions or sign legal documents. Do not take care of children on your own. General instructions Drink enough fluid to keep your urine pale yellow. If you have sleep apnea, surgery and certain medicines can increase your risk for breathing problems. Follow instructions from your health care provider about wearing your sleep device: Anytime you are sleeping, including during daytime naps. While taking prescription pain medicines, sleeping medicines, or medicines  that make you drowsy. Return to your normal activities as told by your health care provider. Ask your health care provider what activities are safe for you. Take over-the-counter and prescription medicines only as told by your health care provider. Do not use any products that contain nicotine  or tobacco. These products include cigarettes, chewing tobacco, and vaping devices, such as e-cigarettes. These can delay incision healing after surgery. If you need help quitting, ask your health care  provider. Contact a health care provider if: You have nausea or vomiting that does not get better with medicine. You vomit every time you eat or drink. You have pain that does not get better with medicine. You cannot urinate or have bloody urine. You develop a skin rash. You have a fever. Get help right away if: You have trouble breathing. You have chest pain. You vomit blood. These symptoms may be an emergency. Get help right away. Call 911. Do not wait to see if the symptoms will go away. Do not drive yourself to the hospital. Summary After the procedure, it is common to have a sore throat, hoarseness, nausea, vomiting, or to feel weak, sleepy, or fatigue. For the time period you were told by your health care provider, do not drive or use machinery. Get help right away if you have difficulty breathing, have chest pain, or vomit blood. These symptoms may be an emergency. This information is not intended to replace advice given to you by your health care provider. Make sure you discuss any questions you have with your health care provider. Document Revised: 11/22/2021 Document Reviewed: 11/22/2021 Elsevier Patient Education  2024 [employer].

## 2024-05-06 ENCOUNTER — Other Ambulatory Visit: Payer: Self-pay

## 2024-05-06 ENCOUNTER — Encounter (HOSPITAL_COMMUNITY): Payer: Self-pay

## 2024-05-06 ENCOUNTER — Encounter (HOSPITAL_COMMUNITY)
Admission: RE | Admit: 2024-05-06 | Discharge: 2024-05-06 | Disposition: A | Discharge status: Home / Self Care | Source: Ambulatory Visit | Attending: Gastroenterology | Admitting: Gastroenterology

## 2024-05-06 VITALS — BP 126/68 | HR 96 | Resp 18 | Ht 62.0 in | Wt 162.7 lb

## 2024-05-06 DIAGNOSIS — Z862 Personal history of diseases of the blood and blood-forming organs and certain disorders involving the immune mechanism: Secondary | ICD-10-CM

## 2024-05-06 DIAGNOSIS — D509 Iron deficiency anemia, unspecified: Secondary | ICD-10-CM | POA: Insufficient documentation

## 2024-05-06 DIAGNOSIS — Z01812 Encounter for preprocedural laboratory examination: Secondary | ICD-10-CM | POA: Insufficient documentation

## 2024-05-06 LAB — CBC WITH DIFFERENTIAL/PLATELET
Abs Immature Granulocytes: 0.03 K/uL (ref 0.00–0.07)
Basophils Absolute: 0.1 K/uL (ref 0.0–0.1)
Basophils Relative: 1 %
Eosinophils Absolute: 0.2 K/uL (ref 0.0–0.5)
Eosinophils Relative: 2 %
HCT: 30.6 % — ABNORMAL LOW (ref 36.0–46.0)
Hemoglobin: 8.6 g/dL — ABNORMAL LOW (ref 12.0–15.0)
Immature Granulocytes: 0 %
Lymphocytes Relative: 15 %
Lymphs Abs: 1.2 K/uL (ref 0.7–4.0)
MCH: 18.3 pg — ABNORMAL LOW (ref 26.0–34.0)
MCHC: 28.1 g/dL — ABNORMAL LOW (ref 30.0–36.0)
MCV: 65.2 fL — ABNORMAL LOW (ref 80.0–100.0)
Monocytes Absolute: 0.4 K/uL (ref 0.1–1.0)
Monocytes Relative: 5 %
Neutro Abs: 6.2 K/uL (ref 1.7–7.7)
Neutrophils Relative %: 77 %
Platelets: 420 K/uL — ABNORMAL HIGH (ref 150–400)
RBC: 4.69 MIL/uL (ref 3.87–5.11)
RDW: 26 % — ABNORMAL HIGH (ref 11.5–15.5)
Smear Review: NORMAL
WBC: 8.1 K/uL (ref 4.0–10.5)
nRBC: 0 % (ref 0.0–0.2)

## 2024-05-10 ENCOUNTER — Other Ambulatory Visit: Payer: Self-pay

## 2024-05-10 ENCOUNTER — Ambulatory Visit (HOSPITAL_COMMUNITY)
Admission: RE | Admit: 2024-05-10 | Discharge: 2024-05-10 | Disposition: A | Discharge status: Home / Self Care | Attending: Gastroenterology | Admitting: Gastroenterology

## 2024-05-10 ENCOUNTER — Encounter (HOSPITAL_COMMUNITY): Payer: Self-pay | Admitting: Gastroenterology

## 2024-05-10 ENCOUNTER — Encounter (HOSPITAL_COMMUNITY)
Admission: RE | Disposition: A | Discharge status: Home / Self Care | Payer: Self-pay | Source: Home / Self Care | Attending: Gastroenterology

## 2024-05-10 ENCOUNTER — Ambulatory Visit (HOSPITAL_COMMUNITY): Admitting: Anesthesiology

## 2024-05-10 ENCOUNTER — Ambulatory Visit (HOSPITAL_BASED_OUTPATIENT_CLINIC_OR_DEPARTMENT_OTHER): Admitting: Anesthesiology

## 2024-05-10 DIAGNOSIS — Z8673 Personal history of transient ischemic attack (TIA), and cerebral infarction without residual deficits: Secondary | ICD-10-CM | POA: Diagnosis not present

## 2024-05-10 DIAGNOSIS — M1990 Unspecified osteoarthritis, unspecified site: Secondary | ICD-10-CM | POA: Insufficient documentation

## 2024-05-10 DIAGNOSIS — D124 Benign neoplasm of descending colon: Secondary | ICD-10-CM | POA: Diagnosis not present

## 2024-05-10 DIAGNOSIS — F17210 Nicotine dependence, cigarettes, uncomplicated: Secondary | ICD-10-CM | POA: Diagnosis not present

## 2024-05-10 DIAGNOSIS — D120 Benign neoplasm of cecum: Secondary | ICD-10-CM

## 2024-05-10 DIAGNOSIS — F419 Anxiety disorder, unspecified: Secondary | ICD-10-CM | POA: Insufficient documentation

## 2024-05-10 DIAGNOSIS — I5030 Unspecified diastolic (congestive) heart failure: Secondary | ICD-10-CM | POA: Diagnosis not present

## 2024-05-10 DIAGNOSIS — K5730 Diverticulosis of large intestine without perforation or abscess without bleeding: Secondary | ICD-10-CM | POA: Insufficient documentation

## 2024-05-10 DIAGNOSIS — K759 Inflammatory liver disease, unspecified: Secondary | ICD-10-CM | POA: Diagnosis not present

## 2024-05-10 DIAGNOSIS — E119 Type 2 diabetes mellitus without complications: Secondary | ICD-10-CM | POA: Diagnosis not present

## 2024-05-10 DIAGNOSIS — Z79899 Other long term (current) drug therapy: Secondary | ICD-10-CM | POA: Insufficient documentation

## 2024-05-10 DIAGNOSIS — Z780 Asymptomatic menopausal state: Secondary | ICD-10-CM | POA: Insufficient documentation

## 2024-05-10 DIAGNOSIS — I110 Hypertensive heart disease with heart failure: Secondary | ICD-10-CM | POA: Insufficient documentation

## 2024-05-10 DIAGNOSIS — D509 Iron deficiency anemia, unspecified: Secondary | ICD-10-CM | POA: Diagnosis present

## 2024-05-10 DIAGNOSIS — K449 Diaphragmatic hernia without obstruction or gangrene: Secondary | ICD-10-CM | POA: Insufficient documentation

## 2024-05-10 DIAGNOSIS — Z7902 Long term (current) use of antithrombotics/antiplatelets: Secondary | ICD-10-CM | POA: Insufficient documentation

## 2024-05-10 DIAGNOSIS — D122 Benign neoplasm of ascending colon: Secondary | ICD-10-CM | POA: Diagnosis not present

## 2024-05-10 DIAGNOSIS — D123 Benign neoplasm of transverse colon: Secondary | ICD-10-CM | POA: Insufficient documentation

## 2024-05-10 DIAGNOSIS — I10 Essential (primary) hypertension: Secondary | ICD-10-CM | POA: Diagnosis not present

## 2024-05-10 DIAGNOSIS — K219 Gastro-esophageal reflux disease without esophagitis: Secondary | ICD-10-CM | POA: Insufficient documentation

## 2024-05-10 DIAGNOSIS — Z7951 Long term (current) use of inhaled steroids: Secondary | ICD-10-CM | POA: Insufficient documentation

## 2024-05-10 DIAGNOSIS — E669 Obesity, unspecified: Secondary | ICD-10-CM | POA: Insufficient documentation

## 2024-05-10 DIAGNOSIS — K5520 Angiodysplasia of colon without hemorrhage: Secondary | ICD-10-CM | POA: Diagnosis not present

## 2024-05-10 DIAGNOSIS — R519 Headache, unspecified: Secondary | ICD-10-CM | POA: Diagnosis not present

## 2024-05-10 DIAGNOSIS — J449 Chronic obstructive pulmonary disease, unspecified: Secondary | ICD-10-CM | POA: Diagnosis not present

## 2024-05-10 DIAGNOSIS — K5900 Constipation, unspecified: Secondary | ICD-10-CM | POA: Insufficient documentation

## 2024-05-10 DIAGNOSIS — K5140 Inflammatory polyps of colon without complications: Secondary | ICD-10-CM | POA: Diagnosis not present

## 2024-05-10 DIAGNOSIS — E0590 Thyrotoxicosis, unspecified without thyrotoxic crisis or storm: Secondary | ICD-10-CM | POA: Insufficient documentation

## 2024-05-10 DIAGNOSIS — K3189 Other diseases of stomach and duodenum: Secondary | ICD-10-CM | POA: Insufficient documentation

## 2024-05-10 DIAGNOSIS — R0602 Shortness of breath: Secondary | ICD-10-CM | POA: Insufficient documentation

## 2024-05-10 DIAGNOSIS — Z6829 Body mass index (BMI) 29.0-29.9, adult: Secondary | ICD-10-CM | POA: Insufficient documentation

## 2024-05-10 HISTORY — PX: COLONOSCOPY: SHX5424

## 2024-05-10 HISTORY — PX: ESOPHAGOGASTRODUODENOSCOPY: SHX5428

## 2024-05-10 LAB — GLUCOSE, CAPILLARY: Glucose-Capillary: 131 mg/dL — ABNORMAL HIGH (ref 70–99)

## 2024-05-10 SURGERY — COLONOSCOPY
Anesthesia: General

## 2024-05-10 MED ORDER — LACTATED RINGERS IV SOLN: INTRAVENOUS | Status: DC

## 2024-05-10 MED ORDER — PROPOFOL 500 MG/50ML IV EMUL
INTRAVENOUS | Status: DC | PRN
  Administered 2024-05-10: 10 mg via INTRAVENOUS
  Administered 2024-05-10: 150 ug/kg/min via INTRAVENOUS
  Administered 2024-05-10: 50 mg via INTRAVENOUS

## 2024-05-10 MED ORDER — LIDOCAINE 2% (20 MG/ML) 5 ML SYRINGE
INTRAMUSCULAR | Status: DC | PRN
  Administered 2024-05-10: 80 mg via INTRAVENOUS

## 2024-05-10 MED ORDER — PHENYLEPHRINE 80 MCG/ML (10ML) SYRINGE FOR IV PUSH (FOR BLOOD PRESSURE SUPPORT)
PREFILLED_SYRINGE | INTRAVENOUS | Status: DC | PRN
  Administered 2024-05-10 (×6): 160 ug via INTRAVENOUS

## 2024-05-10 MED ORDER — LACTATED RINGERS IV SOLN: INTRAVENOUS | Status: DC | PRN

## 2024-05-10 MED ORDER — EPHEDRINE SULFATE-NACL 50-0.9 MG/10ML-% IV SOSY
PREFILLED_SYRINGE | INTRAVENOUS | Status: DC | PRN
  Administered 2024-05-10 (×2): 10 mg via INTRAVENOUS
  Administered 2024-05-10: 5 mg via INTRAVENOUS
  Administered 2024-05-10 (×2): 10 mg via INTRAVENOUS

## 2024-05-10 NOTE — Op Note (Signed)
 [facility] Patient Name: Elizabeth [name] Procedure Date: 05/10/2024 10:17 AM MRN: [id number] Date of Birth: Jan 25, 1941 Attending MD: Toribio Fortune , , [id number] CSN: [id number] Age: 83 Admit Type: Outpatient Procedure:                Upper GI endoscopy Indications:              Iron deficiency anemia Providers:                Toribio Fortune, Jessica Boudreaux, Kristine L.                            Shirlean Balm, Technician Referring MD:              Medicines:                Monitored Anesthesia Care Complications:            No immediate complications. Estimated Blood Loss:     Estimated blood loss: none. Procedure:                Pre-Anesthesia Assessment:                           - Prior to the procedure, a History and Physical                            was performed, and patient medications, allergies                            and sensitivities were reviewed. The patient's                            tolerance of previous anesthesia was reviewed.                           - The risks and benefits of the procedure and the                            sedation options and risks were discussed with the                            patient. All questions were answered and informed                            consent was obtained.                           - ASA Grade Assessment: II - A patient with mild                            systemic disease.                           After obtaining informed consent, the endoscope was                            passed under direct vision. Throughout the  procedure, the patient's blood pressure, pulse, and                            oxygen saturations were monitored continuously. The                            [id number] ([id number]) Upper was introduced through                            the mouth, and advanced to the third part of                            duodenum. The upper GI endoscopy was accomplished                             without difficulty. The patient tolerated the                            procedure well. Scope In: 10:47:36 AM Scope Out: 10:52:32 AM Total Procedure Duration: 0 hours 4 minutes 56 seconds  Findings:      A 1 cm hiatal hernia was present.      The gastroesophageal flap valve was visualized endoscopically and       classified as Hill Grade III (minimal fold, loose to endoscope, hiatal       hernia likely).      Patchy mildly erythematous mucosa without bleeding was found in the       gastric antrum. Biopsies were taken with a cold forceps for Helicobacter       pylori testing.      The examined duodenum was normal. Impression:               - 1 cm hiatal hernia.                           - Erythematous mucosa in the antrum. Biopsied.                           - Normal examined duodenum. Moderate Sedation:      Per Anesthesia Care Recommendation:           - Discharge patient to home (ambulatory).                           - Resume previous diet.                           - Await pathology results. Procedure Code(s):        --- Professional ---                           [phone], Esophagogastroduodenoscopy, flexible,                            transoral; with biopsy, single or multiple Diagnosis Code(s):        --- Professional ---  K44.9, Diaphragmatic hernia without obstruction or                            gangrene                           K31.89, Other diseases of stomach and duodenum                           D50.9, Iron deficiency anemia, unspecified CPT copyright 2022 American [employer]. All rights reserved. The codes documented in this report are preliminary and upon coder review may  be revised to meet current compliance requirements. Toribio Fortune, MD Toribio Fortune,  05/10/2024 10:56:47 AM This report has been signed electronically. Number of Addenda: 0

## 2024-05-10 NOTE — Transfer of Care (Signed)
 Immediate Anesthesia Transfer of Care Note  Patient: Elizabeth [name]  Procedure(s) Performed: COLONOSCOPY EGD (ESOPHAGOGASTRODUODENOSCOPY)  Patient Location: PACU and Short Stay  Anesthesia Type:General  Level of Consciousness: awake, alert , and oriented  Airway & Oxygen Therapy: Patient Spontanous Breathing  Post-op Assessment: Report given to RN and Post -op Vital signs reviewed and stable  Post vital signs: Reviewed and stable  Last Vitals:  Vitals Value Taken Time  BP 112/51 05/10/24 11:53  Temp 36.3 C 05/10/24 11:53  Pulse 76 05/10/24 11:53  Resp 17 05/10/24 11:53  SpO2 98 % 05/10/24 11:53    Last Pain:  Vitals:   05/10/24 1153  TempSrc: Oral  PainSc: 0-No pain      Patients Stated Pain Goal: 7 (05/10/24 9161)  Complications: No notable events documented.

## 2024-05-10 NOTE — Anesthesia Postprocedure Evaluation (Signed)
 Anesthesia Post Note  Patient: Elizabeth [name]  Procedure(s) Performed: COLONOSCOPY EGD (ESOPHAGOGASTRODUODENOSCOPY)  Patient location during evaluation: Phase II Anesthesia Type: General Level of consciousness: awake and alert Pain management: pain level controlled Vital Signs Assessment: post-procedure vital signs reviewed and stable Respiratory status: spontaneous breathing, nonlabored ventilation and respiratory function stable Cardiovascular status: stable Anesthetic complications: no   There were no known notable events for this encounter.   Last Vitals:  Vitals:   05/10/24 0838 05/10/24 1153  BP: (!) 124/96 (!) 112/51  Pulse: 77 76  Resp: 16 17  Temp: 36.4 C (!) 36.3 C  SpO2: 99% 98%    Last Pain:  Vitals:   05/10/24 1153  TempSrc: Oral  PainSc: 0-No pain                 Shasta Chinn L Ricketta Colantonio

## 2024-05-10 NOTE — Op Note (Signed)
 [facility] Patient Name: Elizabeth [name] Procedure Date: 05/10/2024 10:55 AM MRN: [id number] Date of Birth: [birth year] Attending MD: Toribio Fortune , , [id number] CSN: [id number] Age: 83 Admit Type: Outpatient Procedure:                Colonoscopy Indications:              Iron deficiency anemia Providers:                Toribio Fortune, Jessica Boudreaux, Kristine L.                            Shirlean Balm, Technician Referring MD:              Medicines:                Monitored Anesthesia Care Complications:            No immediate complications. Estimated Blood Loss:     Estimated blood loss: none. Procedure:                Pre-Anesthesia Assessment:                           - Prior to the procedure, a History and Physical                            was performed, and patient medications, allergies                            and sensitivities were reviewed. The patient's                            tolerance of previous anesthesia was reviewed.                           - The risks and benefits of the procedure and the                            sedation options and risks were discussed with the                            patient. All questions were answered and informed                            consent was obtained.                           - ASA Grade Assessment: II - A patient with mild                            systemic disease.                           After obtaining informed consent, the colonoscope                            was passed under direct vision. Throughout the  procedure, the patient's blood pressure, pulse, and                            oxygen saturations were monitored continuously. The                            [id number]-[id number] ([id number]) Peds Colon was introduced                            through the anus and advanced to the the cecum,                            identified by appendiceal orifice and ileocecal                             valve. The colonoscopy was performed without                            difficulty. The patient tolerated the procedure                            well. The quality of the bowel preparation was                            adequate to identify polyps greater than 5 mm in                            size. Scope In: 11:00:28 AM Scope Out: 11:48:12 AM Scope Withdrawal Time: 0 hours 36 minutes 8 seconds  Total Procedure Duration: 0 hours 47 minutes 44 seconds  Findings:      The perianal and digital rectal examinations were normal.      A single small localized angiodysplastic lesion without bleeding was       found in the cecum. Coagulation for bleeding prevention using argon       plasma at 0.3 liters/minute and 20 watts was successful.      Eighteen sessile polyps were found in the transverse colon, ascending       colon and cecum. The polyps were 2 to 10 mm in size. These polyps were       removed with a cold snare. Resection and retrieval were complete.      A 4 mm polyp was found in the descending colon. The polyp was sessile.       The polyp was removed with a cold snare. Resection and retrieval were       complete.      A few small-mouthed diverticula were found in the sigmoid colon.      The retroflexed view of the distal rectum and anal verge was normal and       showed no anal or rectal abnormalities. Impression:               - A single non-bleeding colonic angiodysplastic                            lesion. Treated with argon plasma coagulation (APC).                           -  Eighteen 2 to 10 mm polyps in the transverse                            colon, in the ascending colon and in the cecum,                            removed with a cold snare. Resected and retrieved.                           - One 4 mm polyp in the descending colon, removed                            with a cold snare. Resected and retrieved.                           - Diverticulosis in the sigmoid  colon.                           - The distal rectum and anal verge are normal on                            retroflexion view. Moderate Sedation:      Per Anesthesia Care Recommendation:           - Discharge patient to home (ambulatory).                           - Resume previous diet.                           - Await pathology results.                           - Repeat colonoscopy in 1 year for surveillance.                           - Repeat CBC and iron stores in 1 month - if                            persistent worsening anemia, will proceed with                            capsule endoscopy.                           - Restart Plavix  on 05/12/2024 Procedure Code(s):        --- Professional ---                           [id number], 59, Colonoscopy, flexible; with control of                            bleeding, any method  54614, Colonoscopy, flexible; with removal of                            tumor(s), polyp(s), or other lesion(s) by snare                            technique Diagnosis Code(s):        --- Professional ---                           K55.20, Angiodysplasia of colon without hemorrhage                           D12.3, Benign neoplasm of transverse colon (hepatic                            flexure or splenic flexure)                           D12.2, Benign neoplasm of ascending colon                           D12.0, Benign neoplasm of cecum                           D12.4, Benign neoplasm of descending colon                           D50.9, Iron deficiency anemia, unspecified                           K57.30, Diverticulosis of large intestine without                            perforation or abscess without bleeding CPT copyright 2022 American Medical Association. All rights reserved. The codes documented in this report are preliminary and upon coder review may  be revised to meet current compliance requirements. Toribio Fortune, MD Toribio Fortune,  05/10/2024 11:56:38 AM This report has been signed electronically. Number of Addenda: 0

## 2024-05-10 NOTE — Discharge Instructions (Addendum)
 You are being discharged to home.  Resume your previous diet.  We are waiting for your pathology results.  Your physician has recommended a repeat colonoscopy in one year for surveillance.  Repeat CBC and iron stores in 1 month - if persistent worsening anemia, will proceed with capsule endoscopy. Restart Plavix  on 05/12/2024

## 2024-05-10 NOTE — Interval H&P Note (Signed)
 History and Physical Interval Note:  05/10/2024 9:13 AM  Ronal RAMAN Prim  has presented today for surgery, with the diagnosis of IDA.  The various methods of treatment have been discussed with the patient and family. After consideration of risks, benefits and other options for treatment, the patient has consented to  Procedure(s) with comments: COLONOSCOPY (N/A) - 10:00am, asa 3 EGD (ESOPHAGOGASTRODUODENOSCOPY) (N/A) as a surgical intervention.  The patient's history has been reviewed, patient examined, no change in status, stable for surgery.  I have reviewed the patient's chart and labs.  Questions were answered to the patient's satisfaction.     Afua Hoots Castaneda Mayorga

## 2024-05-10 NOTE — Anesthesia Preprocedure Evaluation (Addendum)
 Anesthesia Evaluation  Patient identified by MRN, date of birth, ID band Patient awake    Reviewed: Allergy & Precautions, H&P , NPO status , Patient's Chart, lab work & pertinent test results  History of Anesthesia Complications Negative for: history of anesthetic complications  Airway Mallampati: III       Dental  (+) Poor Dentition, Dental Advisory Given, Edentulous Upper, Missing Extremely bad teeth lower:   Pulmonary shortness of breath, asthma , COPD, Current Smoker   Pulmonary exam normal        Cardiovascular hypertension, Normal cardiovascular exam Rhythm:Regular Rate:Normal  Grade 1 diastolic dysfunction   Neuro/Psych  Headaches PSYCHIATRIC DISORDERS Anxiety     Cerebral aneurysm  Neuromuscular disease CVA    GI/Hepatic ,GERD  ,,(+) Hepatitis -GI bleeding   Endo/Other  diabetes, Type 2 Hyperthyroidism   Renal/GU      Musculoskeletal  (+) Arthritis , Osteoarthritis,    Abdominal   Peds  Hematology  (+) Blood dyscrasia, anemia Hgb 8.6   Anesthesia Other Findings   Reproductive/Obstetrics                              Anesthesia Physical Anesthesia Plan  ASA: 3  Anesthesia Plan: General   Post-op Pain Management: Minimal or no pain anticipated   Induction: Intravenous  PONV Risk Score and Plan: Propofol  infusion  Airway Management Planned: Nasal Cannula and Natural Airway  Additional Equipment: None  Intra-op Plan:   Post-operative Plan:   Informed Consent: I have reviewed the patients History and Physical, chart, labs and discussed the procedure including the risks, benefits and alternatives for the proposed anesthesia with the patient or authorized representative who has indicated his/her understanding and acceptance.     Dental advisory given  Plan Discussed with: CRNA  Anesthesia Plan Comments:          Anesthesia Quick Evaluation

## 2024-05-11 ENCOUNTER — Encounter (HOSPITAL_COMMUNITY): Payer: Self-pay | Admitting: Gastroenterology

## 2024-05-12 LAB — SURGICAL PATHOLOGY

## 2024-05-13 ENCOUNTER — Ambulatory Visit: Payer: Self-pay | Admitting: Gastroenterology

## 2024-05-13 NOTE — Progress Notes (Signed)
 Hi,  Just for curiosity, if the lesion was not bleeding but ablation was performed to prevent bleeding, can it still be coded as 45382? Thanks

## 2024-05-16 ENCOUNTER — Encounter (INDEPENDENT_AMBULATORY_CARE_PROVIDER_SITE_OTHER): Payer: Self-pay | Admitting: *Deleted

## 2024-05-16 ENCOUNTER — Ambulatory Visit (HOSPITAL_COMMUNITY)
Admission: RE | Admit: 2024-05-16 | Discharge: 2024-05-16 | Disposition: A | Discharge status: Home / Self Care | Source: Ambulatory Visit | Attending: Gastroenterology

## 2024-05-16 ENCOUNTER — Other Ambulatory Visit (INDEPENDENT_AMBULATORY_CARE_PROVIDER_SITE_OTHER): Payer: Self-pay

## 2024-05-16 DIAGNOSIS — K743 Primary biliary cirrhosis: Secondary | ICD-10-CM | POA: Insufficient documentation

## 2024-05-16 DIAGNOSIS — D509 Iron deficiency anemia, unspecified: Secondary | ICD-10-CM

## 2024-05-16 DIAGNOSIS — K625 Hemorrhage of anus and rectum: Secondary | ICD-10-CM

## 2024-05-16 DIAGNOSIS — Z862 Personal history of diseases of the blood and blood-forming organs and certain disorders involving the immune mechanism: Secondary | ICD-10-CM

## 2024-05-16 NOTE — Progress Notes (Signed)
 1 yr TCS noted in recall Patient result letter mailed Patient's PCP is on EPIC .

## 2024-05-19 ENCOUNTER — Other Ambulatory Visit (INDEPENDENT_AMBULATORY_CARE_PROVIDER_SITE_OTHER): Payer: Self-pay | Admitting: Gastroenterology

## 2024-05-19 DIAGNOSIS — R932 Abnormal findings on diagnostic imaging of liver and biliary tract: Secondary | ICD-10-CM

## 2024-05-19 DIAGNOSIS — K7400 Hepatic fibrosis, unspecified: Secondary | ICD-10-CM

## 2024-05-19 DIAGNOSIS — K743 Primary biliary cirrhosis: Secondary | ICD-10-CM

## 2024-05-23 ENCOUNTER — Other Ambulatory Visit: Payer: Self-pay | Admitting: Family Medicine

## 2024-05-23 DIAGNOSIS — M546 Pain in thoracic spine: Secondary | ICD-10-CM

## 2024-05-31 ENCOUNTER — Ambulatory Visit (INDEPENDENT_AMBULATORY_CARE_PROVIDER_SITE_OTHER)

## 2024-05-31 VITALS — BP 143/72 | HR 102 | Ht 62.0 in | Wt 162.0 lb

## 2024-05-31 DIAGNOSIS — Z0000 Encounter for general adult medical examination without abnormal findings: Secondary | ICD-10-CM | POA: Diagnosis not present

## 2024-05-31 NOTE — Progress Notes (Signed)
 Subjective:   Elizabeth [name] is a 83 y.o. who presents for a Medicare Wellness preventive visit.  As a reminder, Annual Wellness Visits don't include a physical exam, and some assessments may be limited, especially if this visit is performed virtually. We may recommend an in-person follow-up visit with your provider if needed.  Visit Complete: Virtual I connected with  Terrill Wauters Haydu on 05/31/24 by a audio enabled telemedicine application and verified that I am speaking with the correct person using two identifiers.  Patient Location: Home  Provider Location: Home Office  I discussed the limitations of evaluation and management by telemedicine. The patient expressed understanding and agreed to proceed.  Vital Signs: Because this visit was a virtual/telehealth visit, some criteria may be missing or patient reported. Any vitals not documented were not able to be obtained and vitals that have been documented are patient reported.  VideoDeclined- This patient declined Librarian, academic. Therefore the visit was completed with audio only.  Persons Participating in Visit: Patient.  AWV Questionnaire: No: Patient Medicare AWV questionnaire was not completed prior to this visit.  Cardiac Risk Factors include: advanced age (>43men, >79 women);diabetes mellitus;dyslipidemia;hypertension;smoking/ tobacco exposure     Objective:    Today's Vitals   05/31/24 1057 05/31/24 1059  BP:  (!) 143/72  Pulse:  (!) 102  Weight:  162 lb (73.5 kg)  Height:  5' 2 (1.575 m)  PainSc: 5     Body mass index is 29.63 kg/m.     05/31/2024   11:09 AM 05/10/2024    8:44 AM 05/06/2024   10:13 AM 04/20/2024    2:50 PM 09/15/2023   10:54 AM 03/25/2022    8:51 AM 02/10/2022    8:34 AM  Advanced Directives  Does Patient Have a Medical Advance Directive? Yes No No No Yes Yes Yes  Type of Estate agent of [place];Living will    Healthcare Power of [place];Living will  Healthcare Power of [employer] of [place];Living will  Does patient want to make changes to medical advance directive?     No - Patient declined  No - Patient declined  Copy of Healthcare Power of Attorney in Chart? Yes - validated most recent copy scanned in chart (See row information)    No - copy requested  Yes - validated most recent copy scanned in chart (See row information)  Would patient like information on creating a medical advance directive?   No - Patient declined        Current Medications (verified) Outpatient Encounter Medications as of 05/31/2024  Medication Sig   Accu-Chek Softclix Lancets lancets Test BS in the morning, at noon and at bedtime Dx E11.69   albuterol  (VENTOLIN  HFA) 108 (90 Base) MCG/ACT inhaler INHALE TWO PUFFS BY MOUTH EVERY 6 HOURS AS NEEDED FOR wheezing OR SHORTNESS OF BREATH   amLODipine  (NORVASC ) 10 MG tablet TAKE 1 TABLET BY MOUTH EVERY DAY   azelastine  (ASTELIN ) 0.1 % nasal spray INSTILL 2 SPRAYS IN EACH NOSTRIL TWICE DAILY   Blood Glucose Monitoring Suppl DEVI 1 each by Does not apply route in the morning, at noon, and at bedtime. May substitute to any manufacturer covered by patient's insurance.   chlorpheniramine (ALLERGY RELIEF) 4 MG tablet Take 4 mg by mouth 2 (two) times daily as needed for allergies.   clopidogrel  (PLAVIX ) 75 MG tablet Take 1 tablet (75 mg total) by mouth daily.   cyclobenzaprine  (FLEXERIL ) 5 MG tablet TAKE 1 TABLET  BY MOUTH AT BEDTIME   ezetimibe  (ZETIA ) 10 MG tablet TAKE 1 TABLET BY MOUTH DAILY   Glucose Blood (BLOOD GLUCOSE TEST STRIPS) STRP 1 each by In Vitro route in the morning, at noon, and at bedtime. May substitute to any manufacturer covered by patient's insurance.   Grape Seed OIL Take 1 tablet by mouth daily at 6 (six) AM.   hydrochlorothiazide  (HYDRODIURIL ) 12.5 MG tablet TAKE 1 TABLET BY MOUTH DAILY   Lancets Misc. (ACCU-CHEK SOFTCLIX LANCET DEV) KIT Test BS in the morning, at noon and at bedtime Dx  E11.69   montelukast  (SINGULAIR ) 10 MG tablet TAKE 1 TABLET BY MOUTH AT BEDTIME   ondansetron  (ZOFRAN ) 4 MG tablet Take 1 tablet (4 mg total) by mouth every 6 (six) hours. (Patient taking differently: Take 4 mg by mouth every 8 (eight) hours as needed for nausea or vomiting.)   OVER THE COUNTER MEDICATION Take 1 capsule by mouth daily at 6 (six) AM. Brain essense   pantoprazole  (PROTONIX ) 40 MG tablet TAKE 1 TABLET BY MOUTH DAILY BEFORE BREAKFAST   REPATHA  SURECLICK 140 MG/ML SOAJ INJECT 1 ML into THE SKIN EVERY 14 DAYS   Specialty Vitamins Products (COLLAGEN ULTRA PO) Take 60 mg by mouth 1 day or 1 dose. Collagen peptides   UNABLE TO FIND Take 1 capsule by mouth 2 (two) times daily. Cinnamon, Chromium & Biotin   UNABLE TO FIND Take 0.5 tablets by mouth daily. Curamin 1/2 tablet for pain   ursodiol  (ACTIGALL ) 300 MG capsule TAKE ONE CAPSULE BY MOUTH THREE TIMES DAILY   Vitamin D , Ergocalciferol , (DRISDOL ) 1.25 MG (50000 UNIT) CAPS capsule TAKE ONE CAPSULE BY MOUTH EVERY 7 DAYS (Patient taking differently: Take 50,000 Units by mouth every Monday.)   No facility-administered encounter medications on file as of 05/31/2024.    Allergies (verified) Banana, Codeine, Stevia [stevioside], Banana flavoring agent (non-screening), Lipitor [atorvastatin ], Statins, Vicodin [hydrocodone-acetaminophen ], Zocor [simvastatin], Ace inhibitors, Actos [pioglitazone], Benicar [olmesartan medoxomil], Celebrex [celecoxib], Chantix [varenicline], Diovan [valsartan], Latex, Metformin  and related, Mushroom extract complex (obsolete), Penicillins, Rice, Spinach, and Sulfonamide derivatives   History: Past Medical History:  Diagnosis Date   Absolute anemia 07/28/2018   Active smoker    Allergy    Aneurysm    Anxiety    Takes Xanax  for anxiety   Arthritis    Asthma    Cataract    Cerebrovascular accident (CVA) due to thrombosis of cerebral artery (HCC) 01/30/2022   Chronic airway obstruction, not elsewhere  classified    Clotting disorder    stoke    Depression    Diarrhea    Diverticulosis of colon (without mention of hemorrhage)    Esophageal reflux    Family history of malignant neoplasm of gastrointestinal tract    GI bleed    Headache(784.0)    Irregular   Neuromuscular disorder (HCC)    numbness is left hand and left elbow   Obesity, unspecified    Other and unspecified hyperlipidemia    Personal history of colonic polyps    Stricture and stenosis of esophagus    Stroke (HCC)    Thyroid  disease    Type II or unspecified type diabetes mellitus without mention of complication, not stated as uncontrolled    Unspecified asthma(493.90)    Unspecified essential hypertension    Past Surgical History:  Procedure Laterality Date   APPENDECTOMY     BACK SURGERY     Spinal surgery   BRAIN SURGERY     2013 - stoke (  fall in 2010)    CARDIAC CATHETERIZATION  2006   LAD: 30%, RCA : 20%, normal EF, elevated LVEDP   CARDIAC CATHETERIZATION     CEREBRAL ANGIOGRAM  12/20/2015   COLONOSCOPY N/A 08/09/2018   Procedure: COLONOSCOPY;  Surgeon: Golda Claudis PENNER, MD;  [facility];  Service: Endoscopy;  Laterality: N/A;  1:55   COLONOSCOPY N/A 05/10/2024   Procedure: COLONOSCOPY;  Surgeon: Eartha Angelia Sieving, MD;  [facility];  Service: Gastroenterology;  Laterality: N/A;  10:00am, asa 3   ESOPHAGEAL DILATION N/A 08/09/2018   Procedure: ESOPHAGEAL DILATION;  Surgeon: Golda Claudis PENNER, MD;  [facility];  Service: Endoscopy;  Laterality: N/A;   ESOPHAGOGASTRODUODENOSCOPY N/A 08/09/2018   Procedure: ESOPHAGOGASTRODUODENOSCOPY (EGD);  Surgeon: Golda Claudis PENNER, MD;  [facility];  Service: Endoscopy;  Laterality: N/A;   ESOPHAGOGASTRODUODENOSCOPY N/A 05/10/2024   Procedure: EGD (ESOPHAGOGASTRODUODENOSCOPY);  Surgeon: Eartha Angelia, Sieving, MD;  [facility];  Service: Gastroenterology;  Laterality: N/A;   GIVENS CAPSULE STUDY N/A  08/25/2018   Procedure: GIVENS CAPSULE STUDY;  Surgeon: Golda Claudis PENNER, MD;  [facility];  Service: Endoscopy;  Laterality: N/A;   IR ANGIO INTRA EXTRACRAN SEL COM CAROTID INNOMINATE BILAT MOD SED  01/14/2018   IR ANGIO INTRA EXTRACRAN SEL COM CAROTID INNOMINATE BILAT MOD SED  05/11/2018   IR ANGIO VERTEBRAL SEL SUBCLAVIAN INNOMINATE UNI L MOD SED  01/14/2018   IR ANGIO VERTEBRAL SEL SUBCLAVIAN INNOMINATE UNI L MOD SED  05/11/2018   IR ANGIO VERTEBRAL SEL VERTEBRAL UNI R MOD SED  01/14/2018   IR ANGIO VERTEBRAL SEL VERTEBRAL UNI R MOD SED  05/11/2018   KNEE ARTHROSCOPY     left   LUMBAR DISC SURGERY     POLYPECTOMY  08/09/2018   Procedure: POLYPECTOMY;  Surgeon: Golda Claudis PENNER, MD;  [facility];  Service: Endoscopy;;  ascending colon (CS x 2, HS x5) hepatic flexure (HSx1), recto-sigmoid (HSx1)   TONSILLECTOMY     TOTAL ABDOMINAL HYSTERECTOMY     menopause/bleeding   Family History  Problem Relation Age of Onset   Diabetes Mother    Heart disease Mother    Asthma Mother    Kidney disease Mother    Drug abuse Mother    Hypertension Mother    Stroke Mother        fall -several bleed on brain 75   Early death Father        trauma   Colon cancer Other        first cousin, paternal aunt and uncle   Cancer Paternal Grandmother        gastric   Diabetes Paternal Grandmother    Mental illness Paternal Grandmother    Heart disease Maternal Grandmother    Thyroid  cancer Other        aunt   Heart disease Sister    Mental illness Sister    Heart attack Sister    Heart disease Brother    Lung disease Brother    Cancer Sister        pancreatic   Diabetes Sister    Hypertension Sister    Tuberculosis Maternal Grandfather    Heart disease Paternal Grandfather    Social History   Socioeconomic History   Marital status: Married    Spouse name: Lamar   Number of children: 0   Years of education: 16   Highest education level: Bachelor's degree (e.g., BA, AB, BS)  Occupational History   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   Tobacco Use   Smoking status: Every Day    Types: E-cigarettes    Passive exposure: Current   Smokeless tobacco: Never  Vaping Use   Vaping status: Every Day   Substances: THC, CBD, Flavoring, Nicotine -salt, Synthetic cannabinoids, Mixture of cannabinoids, Homemade substance  Substance and Sexual Activity   Alcohol  use: No    Alcohol /week: 0.0 standard drinks of alcohol    Drug use: No   Sexual activity: Not Currently  Other Topics Concern   Not on file  Social History Narrative   Married Lamar   Limited activity due to back pain   Social Drivers of Health   Financial Resource Strain: Low Risk  (05/31/2024)   Overall Financial Resource Strain (CARDIA)    Difficulty of Paying Living Expenses: Not hard at all  Food Insecurity: No Food Insecurity (05/31/2024)   Hunger Vital Sign    Worried About Running Out of Food in the Last Year: Never true    Ran Out of Food in the Last Year: Never true  Transportation Needs: No Transportation Needs (05/31/2024)   PRAPARE - Administrator, Civil Service (Medical): No    Lack of Transportation (Non-Medical): No  Physical Activity: Insufficiently Active (05/31/2024)   Exercise Vital Sign    Days of Exercise per Week: 3 days    Minutes of Exercise per Session: 20 min  Stress: No Stress Concern Present (05/31/2024)   [employer] of Occupational Health - Occupational Stress Questionnaire    Feeling of Stress: Only a little  Social Connections: Moderately Isolated (05/31/2024)   Social Connection and Isolation Panel    Frequency of Communication with Friends and Family: More than three times a week    Frequency of Social Gatherings with Friends and Family: More than three times a week    Attends Religious Services: Never    Database administrator or Organizations: No    Attends Engineer, structural: Never    Marital Status: Married     Tobacco Counseling Ready to quit: Not Answered Counseling given: Yes    Clinical Intake:  Pre-visit preparation completed: Yes  Pain : 0-10 (have a little on the r-shoulder on the right hand) Pain Score: 5  Pain Type: Chronic pain Pain Location: Shoulder Pain Orientation: Right Pain Descriptors / Indicators: Constant Pain Onset: Other (comment) Pain Frequency: Constant Pain Relieving Factors: pain med  Pain Relieving Factors: pain med  BMI - recorded: 29.63 Nutritional Status: BMI 25 -29 Overweight Nutritional Risks: None Diabetes: Yes  Lab Results  Component Value Date   HGBA1C 5.7 (H) 03/02/2024   HGBA1C 5.7 (H) 11/24/2023   HGBA1C 5.7 (H) 10/24/2022     How often do you need to have someone help you when you read instructions, pamphlets, or other written materials from your doctor or pharmacy?: 3 - Sometimes (pt's husband)  Interpreter Needed?: No  Information entered by :: alia t/cma   Activities of Daily Living     05/31/2024   11:06 AM 05/06/2024   10:12 AM  In your present state of health, do you have any difficulty performing the following activities:  Hearing? 0 0  Vision? 0 0  Difficulty concentrating or making decisions? 0 0  Walking or climbing stairs? 1   Dressing or bathing? 1   Comment bathing   Doing errands, shopping? 1   Comment pt's husband/niece   Preparing  Food and eating ? N   Using the Toilet? N   In the past six months, have you accidently leaked urine? Y   Do you have problems with loss of bowel control? N   Managing your Medications? Y   Comment pt's husband   Managing your Finances? Y   Comment pt's husband   Housekeeping or managing your Housekeeping? Y   Comment pt's husband     Patient Care Team: Zollie Lowers, MD as PCP - General (Family Medicine) Alvan, Dorn FALCON, MD as PCP - Cardiology (Cardiology) Shlomo Fallow, OD (Optometry) Golda Claudis PENNER, MD (Inactive) as Consulting Physician  (Gastroenterology) Dolphus Carrion, MD as Consulting Physician (Interventional Radiology)  I have updated your Care Teams any recent Medical Services you may have received from other providers in the past year.     Assessment:   This is a routine wellness examination for [facility].  Hearing/Vision screen Hearing Screening - Comments:: Pt denies hearing dif Vision Screening - Comments:: Pt wear glasses/pt goes Dr Nicholaus in [place]/last ov 6 mos ago   Goals Addressed             This Visit's Progress    Prevent falls   On track    Use cane more - stay active        Depression Screen     05/31/2024   11:10 AM 11/24/2023   10:36 AM 06/15/2023   10:41 AM 01/27/2023   11:26 AM 01/27/2023   11:10 AM 10/13/2022   11:01 AM 09/10/2022    9:50 AM  PHQ 2/9 Scores  PHQ - 2 Score 2  0 2 4 0 0  PHQ- 9 Score 4  0 5 13 0 0  Exception Documentation  Patient refusal         Fall Risk     05/31/2024   10:57 AM 10/19/2023    9:54 AM 06/15/2023   10:41 AM 01/27/2023   11:10 AM 10/13/2022   11:01 AM  Fall Risk   Falls in the past year? 0 1 0 0 0  Number falls in past yr: 0 1 0 0 0  Injury with Fall? 0 1 0 0 0  Risk for fall due to : No Fall Risks Impaired mobility;Impaired balance/gait;Other (Comment) No Fall Risks No Fall Risks No Fall Risks  Risk for fall due to: Comment  age     Follow up Falls evaluation completed Follow up appointment Falls evaluation completed Falls evaluation completed Falls evaluation completed    MEDICARE RISK AT HOME:  Medicare Risk at Home Any stairs in or around the home?: Yes If so, are there any without handrails?: Yes Home free of loose throw rugs in walkways, pet beds, electrical cords, etc?: Yes Adequate lighting in your home to reduce risk of falls?: Yes Life alert?: No Use of a cane, walker or w/c?: Yes Grab bars in the bathroom?: Yes Shower chair or bench in shower?: Yes Elevated toilet seat or a handicapped toilet?: Yes  TIMED UP AND GO:  Was the  test performed?  no  Cognitive Function: 6CIT completed        05/31/2024   11:14 AM 02/10/2022    8:42 AM 02/03/2019   10:18 AM  6CIT Screen  What Year? 0 points 0 points 0 points  What month? 3 points 0 points 0 points  What time? 0 points 0 points 0 points  Count back from 20 0 points 0 points 0 points  Months in reverse  4 points 0 points 0 points  Repeat phrase 10 points 0 points 0 points  Total Score 17 points 0 points 0 points    Immunizations Immunization History  Administered Date(s) Administered   Fluad Quad(high Dose 65+) 06/10/2022   INFLUENZA, HIGH DOSE SEASONAL PF 06/17/2019   Influenza Whole 06/08/2010   Influenza,inj,Quad PF,6+ Mos 07/07/2013, 07/06/2014, 07/17/2017   Influenza-Unspecified 08/05/2016   Moderna Covid-19 Vaccine Bivalent Booster 23yrs & up 06/06/2021   Moderna SARS-COV2 Booster Vaccination 07/24/2020   Moderna Sars-Covid-2 Vaccination 10/13/2019, 11/11/2019   Pneumococcal Conjugate-13 11/25/2013   Pneumococcal Polysaccharide-23 09/09/2007, 07/06/2014   Tdap 09/15/2023    Screening Tests Health Maintenance  Topic Date Due   OPHTHALMOLOGY EXAM  05/05/2015   Diabetic kidney evaluation - Urine ACR  06/14/2023   Influenza Vaccine  04/08/2024   COVID-19 Vaccine (4 - 2025-26 season) 05/09/2024   FOOT EXAM  06/02/2024 (Originally 01/27/2024)   Zoster Vaccines- Shingrix (1 of 2) 06/02/2024 (Originally 01/14/1960)   HEMOGLOBIN A1C  09/01/2024   Diabetic kidney evaluation - eGFR measurement  04/20/2025   Colonoscopy  05/10/2025   Medicare Annual Wellness (AWV)  05/31/2025   DTaP/Tdap/Td (2 - Td or Tdap) 09/14/2033   Pneumococcal Vaccine: 50+ Years  Completed   DEXA SCAN  Completed   HPV VACCINES  Aged Out   Meningococcal B Vaccine  Aged Out   Hepatitis C Screening  Discontinued    Health Maintenance Items Addressed: See Nurse Notes at the end of this note  Additional Screening:  Vision Screening: Recommended annual ophthalmology exams for  early detection of glaucoma and other disorders of the eye. Is the patient up to date with their annual eye exam?  Yes  Who is the provider or what is the name of the office in which the patient attends annual eye exams? Dr. Nicholaus in [place], [place]  Dental Screening: Recommended annual dental exams for proper oral hygiene  Community Resource Referral / Chronic Care Management: CRR required this visit?  No   CCM required this visit?  No   Plan:    I have personally reviewed and noted the following in the patient's chart:   Medical and social history Use of alcohol , tobacco or illicit drugs  Current medications and supplements including opioid prescriptions. Patient is not currently taking opioid prescriptions. Functional ability and status Nutritional status Physical activity Advanced directives List of other physicians Hospitalizations, surgeries, and ER visits in previous 12 months Vitals Screenings to include cognitive, depression, and falls Referrals and appointments  In addition, I have reviewed and discussed with patient certain preventive protocols, quality metrics, and best practice recommendations. A written personalized care plan for preventive services as well as general preventive health recommendations were provided to patient.   Ozie Ned, CMA   05/31/2024   After Visit Summary: (MyChart) Due to this being a telephonic visit, the after visit summary with patients personalized plan was offered to patient via MyChart   Notes: PCP Follow Up Recommendations: pt is aware and due the following: UrineACR/Diabetic Eye exam. Pt's 6CIT=17

## 2024-05-31 NOTE — Patient Instructions (Signed)
 Ms. Zobel,  Thank you for taking the time for your Medicare Wellness Visit. I appreciate your continued commitment to your health goals. Please review the care plan we discussed, and feel free to reach out if I can assist you further.  Medicare recommends these wellness visits once per year to help you and your care team stay ahead of potential health issues. These visits are designed to focus on prevention, allowing your provider to concentrate on managing your acute and chronic conditions during your regular appointments.  Please note that Annual Wellness Visits do not include a physical exam. Some assessments may be limited, especially if the visit was conducted virtually. If needed, we may recommend a separate in-person follow-up with your provider.  Ongoing Care Seeing your primary care provider every 3 to 6 months helps us  monitor your health and provide consistent, personalized care.   Referrals If a referral was made during today's visit and you haven't received any updates within two weeks, please contact the referred provider directly to check on the status.  Recommended Screenings:  Health Maintenance  Topic Date Due   Eye exam for diabetics  05/05/2015   Medicare Annual Wellness Visit  02/11/2023   Yearly kidney health urinalysis for diabetes  06/14/2023   Flu Shot  04/08/2024   COVID-19 Vaccine (4 - 2025-26 season) 05/09/2024   Complete foot exam   06/02/2024*   Zoster (Shingles) Vaccine (1 of 2) 06/02/2024*   Hemoglobin A1C  09/01/2024   Yearly kidney function blood test for diabetes  04/20/2025   Colon Cancer Screening  05/10/2025   DTaP/Tdap/Td vaccine (2 - Td or Tdap) 09/14/2033   Pneumococcal Vaccine for age over 80  Completed   DEXA scan (bone density measurement)  Completed   HPV Vaccine  Aged Out   Meningitis B Vaccine  Aged Out   Hepatitis C Screening  Discontinued  *Topic was postponed. The date shown is not the original due date.       05/31/2024   11:09  AM  Advanced Directives  Does Patient Have a Medical Advance Directive? Yes  Type of Estate agent of [place];Living will  Copy of Healthcare Power of Attorney in Chart? Yes - validated most recent copy scanned in chart (See row information)   Advance Care Planning is important because it: Ensures you receive medical care that aligns with your values, goals, and preferences. Provides guidance to your family and loved ones, reducing the emotional burden of decision-making during critical moments.  Vision: Annual vision screenings are recommended for early detection of glaucoma, cataracts, and diabetic retinopathy. These exams can also reveal signs of chronic conditions such as diabetes and high blood pressure.  Dental: Annual dental screenings help detect early signs of oral cancer, gum disease, and other conditions linked to overall health, including heart disease and diabetes.  Please see the attached documents for additional preventive care recommendations.

## 2024-06-04 ENCOUNTER — Other Ambulatory Visit: Payer: Self-pay | Admitting: Family Medicine

## 2024-06-04 DIAGNOSIS — M546 Pain in thoracic spine: Secondary | ICD-10-CM

## 2024-06-04 DIAGNOSIS — E559 Vitamin D deficiency, unspecified: Secondary | ICD-10-CM

## 2024-06-22 ENCOUNTER — Encounter (INDEPENDENT_AMBULATORY_CARE_PROVIDER_SITE_OTHER): Payer: Self-pay | Admitting: Gastroenterology

## 2024-06-24 ENCOUNTER — Other Ambulatory Visit: Payer: Self-pay | Admitting: Family Medicine

## 2024-06-24 ENCOUNTER — Telehealth: Payer: Self-pay

## 2024-06-24 NOTE — Telephone Encounter (Unsigned)
 Copied from CRM [id number]. Topic: Clinical - Medication Refill >> Jun 24, 2024 12:31 PM Delon T wrote: Medication: azelastine  (ASTELIN ) 0.1 % nasal spray  Has the patient contacted their pharmacy? No (Agent: If no, request that the patient contact the pharmacy for the refill. If patient does not wish to contact the pharmacy document the reason why and proceed with request.) (Agent: If yes, when and what did the pharmacy advise?)  This is the patient's preferred pharmacy:  [facility] - [place], [place] - [address] [place] [address] Phone: [phone] Fax: [phone]  Is this the correct pharmacy for this prescription? Yes If no, delete pharmacy and type the correct one.   Has the prescription been filled recently? Yes  Is the patient out of the medication? Yes  Has the patient been seen for an appointment in the last year OR does the patient have an upcoming appointment? Yes  Can we respond through MyChart? Yes  Agent: Please be advised that Rx refills may take up to 3 business days. We ask that you follow-up with your pharmacy.

## 2024-06-24 NOTE — Telephone Encounter (Signed)
 Copied from CRM [id number]. Topic: Clinical - Medical Advice >> Jun 24, 2024  8:16 AM Cherylann RAMAN wrote: Reason for CRM: Patient called in with complaints of congestion and the inability to sleep due to her head hurting. Patient states that she took OTC Benadryl  and Caratab (patient's spelling) pronounced Core/ah/Tab. She is requesting provider to send her something to her preferred pharmacy or please advise. [facility], In addition, patient is requesting an evaluation of her walker due to the two front wheels of her walker not turning. Please contact patient at [phone] for additional information.

## 2024-06-24 NOTE — Telephone Encounter (Signed)
 Pt aware ntbs. She will call back.

## 2024-06-27 MED ORDER — AZELASTINE HCL 0.1 % NA SOLN
2.0000 | Freq: Two times a day (BID) | NASAL | 3 refills | Status: AC
Start: 2024-06-27 — End: ?

## 2024-06-29 ENCOUNTER — Other Ambulatory Visit: Payer: Self-pay | Admitting: Family Medicine

## 2024-06-30 ENCOUNTER — Ambulatory Visit (INDEPENDENT_AMBULATORY_CARE_PROVIDER_SITE_OTHER): Admitting: Gastroenterology

## 2024-07-01 ENCOUNTER — Ambulatory Visit: Payer: Self-pay

## 2024-07-01 ENCOUNTER — Other Ambulatory Visit: Payer: Self-pay | Admitting: Family Medicine

## 2024-07-01 NOTE — Telephone Encounter (Signed)
 Can wait for stacks

## 2024-07-01 NOTE — Telephone Encounter (Signed)
 Patient called to report blood sugar of 151-patient is concerned as her blood sugars run 120-130. Patient states blood sugar has been above normal since yesterday. Patient denies any symptoms.  Scheduled to see PCP this coming Wednesday. Patient is asking for any recommendations from provider.  FYI Only or Action Required?: Action required by provider: clinical question for provider.  Patient was last seen in primary care on 03/02/2024 by Zollie Lowers, MD.  Called Nurse Triage reporting Hyperglycemia.  Symptoms began yesterday.  Interventions attempted: Rest, hydration, or home remedies.  Symptoms are: unchanged.  Triage Disposition: Call PCP Within 24 Hours  Patient/caregiver understands and will follow disposition?: Yes  Copied from CRM #[id number]. Topic: Clinical - Red Word Triage >> Jul 01, 2024  9:15 AM Turkey B wrote: [facility] that prompted transfer to Nurse Triage: Patietnt's husband called in states patient BS is high, says 151 and she doesn't feel that great Reason for Disposition  [1] Caller has NON-URGENT medication or insulin  device (e.g., pump, continuous monitoring) question AND [2] triager unable to answer question  Answer Assessment - Initial Assessment Questions 1. BLOOD GLUCOSE: What is your blood glucose level?      151 2. ONSET: When did you check the blood glucose?     This morning 3. USUAL RANGE: What is your glucose level usually? (e.g., usual fasting morning value, usual evening value)     120-130 4. KETONES: Do you check for ketones (urine or blood test strips)? If Yes, ask: What does the test show now?      no 5. TYPE 1 or 2:  Do you know what type of diabetes you have?  (e.g., Type 1, Type 2, Gestational; doesn't know)      Type 2  6. INSULIN : Do you take insulin ? What type of insulin (s) do you use? What is the mode of delivery? (syringe, pen; injection or pump)?      no 7. DIABETES PILLS: Do you take any pills for your diabetes? If  Yes, ask: Have you missed taking any pills recently?     no 8. OTHER SYMPTOMS: Do you have any symptoms? (e.g., fever, frequent urination, difficulty breathing, dizziness, weakness, vomiting)     no  Protocols used: Diabetes - High Blood Sugar-A-AH

## 2024-07-01 NOTE — Telephone Encounter (Signed)
 Spoke to pt and told her to go to Ed or emergency room if she starts feeling worse. Pt is going to wait to see Dr.Stacks.

## 2024-07-05 ENCOUNTER — Ambulatory Visit (INDEPENDENT_AMBULATORY_CARE_PROVIDER_SITE_OTHER): Admitting: Gastroenterology

## 2024-07-05 ENCOUNTER — Telehealth (INDEPENDENT_AMBULATORY_CARE_PROVIDER_SITE_OTHER): Payer: Self-pay

## 2024-07-05 ENCOUNTER — Encounter (INDEPENDENT_AMBULATORY_CARE_PROVIDER_SITE_OTHER): Payer: Self-pay | Admitting: Gastroenterology

## 2024-07-05 VITALS — BP 155/71 | HR 102 | Temp 97.8°F | Ht 62.0 in | Wt 165.3 lb

## 2024-07-05 DIAGNOSIS — K743 Primary biliary cirrhosis: Secondary | ICD-10-CM

## 2024-07-05 DIAGNOSIS — D509 Iron deficiency anemia, unspecified: Secondary | ICD-10-CM

## 2024-07-05 DIAGNOSIS — R932 Abnormal findings on diagnostic imaging of liver and biliary tract: Secondary | ICD-10-CM | POA: Diagnosis not present

## 2024-07-05 DIAGNOSIS — K649 Unspecified hemorrhoids: Secondary | ICD-10-CM | POA: Insufficient documentation

## 2024-07-05 DIAGNOSIS — K5900 Constipation, unspecified: Secondary | ICD-10-CM | POA: Diagnosis not present

## 2024-07-05 MED ORDER — HYDROCORTISONE (PERIANAL) 2.5 % EX CREA: 1.0000 | TOPICAL_CREAM | Freq: Three times a day (TID) | CUTANEOUS | 1 refills | Status: AC

## 2024-07-05 NOTE — Telephone Encounter (Signed)
 PA on [facility] for DEXA Scan:  Notification or Prior Authorization is not required for the requested services You are not required to submit a notification/prior authorization based on the information provided.  Decision ID #: [id number]

## 2024-07-05 NOTE — Telephone Encounter (Signed)
 ATC pt to give DEXA Scan appt on 10/31 at 3:15pm. No answer, lvm with appt details.

## 2024-07-05 NOTE — Progress Notes (Addendum)
 Referring Provider: Zollie Lowers, MD Primary Care Physician:  Zollie Lowers, MD Primary GI Physician: Dr. Eartha   Chief Complaint  Patient presents with   Follow-up    Patient here today for a follow up on Primary biliary cholangitis. Patient says she is having issues with constipation. Patient is taking a stools softener and another medication over the counter,but they do not know the name of it. She is having issues also with hemorrhoids, and she is using Prep H prn.     HPI:   Elizabeth [name] is a 83 y.o. 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 Having a BM weekly with miralax  Recommended EGD/Colonoscopy, update labs, US  elastography, start PO Iron BID, start miralax titration, discuss dexa scan at f/u  Labs in August with hgb 6.3 (recommended to go to ER where she was transfused), vitamin A44 vitamin D  83 INR 1.2  AFP tumor marker not done   Labs in August with AST 21 AP 103, ALT 13 t bili 0.7 hgb 8.6 plt count 420k   US  liver elastography on 9/8 with median kpa 3.8, nodular contour of liver Recommended fibrosure which has not bee completed   EGD and Colonoscopy in September 2025:  Last Colonoscopy:A single non-bleeding colonic angiodysplastic                            lesion. Treated with argon plasma coagulation (APC).                           - Eighteen 2 to 10 mm polyps in the transverse                            colon, in the ascending colon and in the cecum,                            removed with a cold snare. Resected and retrieved.                           -  One 4 mm polyp in the descending colon, removed                            with a cold snare. Resected and retrieved.                           - Diverticulosis in the sigmoid colon.                           - The distal rectum and anal verge are normal on                            retroflexion view.  Last Endoscopy:- - 1 cm hiatal hernia.                           -  Erythematous mucosa in the antrum. Biopsied.                           - Normal examined duodenum.  GASTRIC, ANTRUM, BIOPSY:  - Gastric antral mucosa with nonspecific reactive gastropathy  - Helicobacter pylori-like organisms are not identified on routine HE  stain   B. COLON, TRANSVERSE, ASCENDING, CECAL, POLYPECTOMY:  - Tubular adenoma(s)  - Negative for high-grade dysplasia or malignancy   C. COLON, DESCENDING, POLYPECTOMY:  - Inflammatory polyp  - Negative for dysplasia   Recommended to repeat CBC and iron studies in early October, not yet completed   Present: Previously having some constipation which has improved with something otc. She is unsure what she is taking at this time, husband is unsure either. She notes she has had flare of hemorrhoids with a lot of rectal pain, only one episode of blood noted on toilet tissue. They report a dark stool this AM, though unclear if this was black. She endorses feeling tired but states I am tired because I am old. Denies SOB or dizziness.  Using preparation H with some improvement in hemorrhoids.  Denies rectal bleeding. She is not taking iron pills as she states no one ever told her to take these. She states she never heard from hematology regarding iron transfusions either. Appetite is good. Weight is stable. No nausea or vomiting. Denies swelling to her stomach or legs, no episodes of confusion or forgetfulness.    Filed Weights   07/05/24 1130  Weight: 165 lb 4.8 oz (75 kg)     Past Medical History:  Diagnosis Date   Absolute anemia 07/28/2018   Active smoker     Allergy    Aneurysm    Anxiety    Takes Xanax  for anxiety   Arthritis    Asthma    Cataract    Cerebrovascular accident (CVA) due to thrombosis of cerebral artery (HCC) 01/30/2022   Chronic airway obstruction, not elsewhere classified    Clotting disorder    stoke    Depression    Diarrhea    Diverticulosis of colon (without mention of hemorrhage)    Esophageal reflux    Family history of malignant neoplasm of gastrointestinal tract    GI bleed    Headache(784.0)    Irregular   Neuromuscular disorder (HCC)    numbness is left hand and left elbow   Obesity, unspecified    Other and unspecified hyperlipidemia    Personal history of colonic polyps    Stricture and stenosis of esophagus    Stroke (HCC)    Thyroid  disease    Type II or unspecified type diabetes mellitus without mention of complication, not stated as uncontrolled    Unspecified asthma(493.90)    Unspecified essential hypertension     Past Surgical History:  Procedure Laterality Date   APPENDECTOMY     BACK SURGERY     Spinal surgery   BRAIN SURGERY     2013 - stoke ( fall in 2010)    CARDIAC CATHETERIZATION  2006   LAD: 30%, RCA : 20%, normal EF, elevated LVEDP   CARDIAC CATHETERIZATION     CEREBRAL ANGIOGRAM  12/20/2015   COLONOSCOPY N/A 08/09/2018   Procedure: COLONOSCOPY;  Surgeon: Golda Claudis PENNER, MD;  [facility];  Service: Endoscopy;  Laterality: N/A;  1:55   COLONOSCOPY N/A 05/10/2024   Procedure: COLONOSCOPY;  Surgeon: Eartha Angelia Sieving, MD;  [facility];  Service: Gastroenterology;  Laterality: N/A;  10:00am, asa 3   ESOPHAGEAL DILATION N/A 08/09/2018   Procedure: ESOPHAGEAL DILATION;  Surgeon: Golda Claudis PENNER, MD;  [facility];  Service: Endoscopy;  Laterality: N/A;   ESOPHAGOGASTRODUODENOSCOPY N/A 08/09/2018   Procedure: ESOPHAGOGASTRODUODENOSCOPY (EGD);  Surgeon: Golda Claudis PENNER, MD;  [facility];  Service: Endoscopy;  Laterality: N/A;    ESOPHAGOGASTRODUODENOSCOPY N/A 05/10/2024   Procedure: EGD (ESOPHAGOGASTRODUODENOSCOPY);  Surgeon: Eartha Flavors, Toribio, MD;  [facility];  Service: Gastroenterology;  Laterality: N/A;   GIVENS CAPSULE STUDY N/A 08/25/2018   Procedure: GIVENS CAPSULE STUDY;  Surgeon: Golda Claudis PENNER, MD;  [facility];  Service: Endoscopy;  Laterality: N/A;   IR ANGIO INTRA EXTRACRAN SEL COM CAROTID INNOMINATE BILAT MOD SED  01/14/2018   IR ANGIO INTRA EXTRACRAN SEL COM CAROTID INNOMINATE BILAT MOD SED  05/11/2018   IR ANGIO VERTEBRAL SEL SUBCLAVIAN INNOMINATE UNI L MOD SED  01/14/2018   IR ANGIO VERTEBRAL SEL SUBCLAVIAN INNOMINATE UNI L MOD SED  05/11/2018   IR ANGIO VERTEBRAL SEL VERTEBRAL UNI R MOD SED  01/14/2018   IR ANGIO VERTEBRAL SEL VERTEBRAL UNI R MOD SED  05/11/2018   KNEE ARTHROSCOPY     left   LUMBAR DISC SURGERY     POLYPECTOMY  08/09/2018   Procedure: POLYPECTOMY;  Surgeon: Golda Claudis PENNER, MD;  [facility];  Service: Endoscopy;;  ascending colon (CS x 2, HS x5) hepatic flexure (HSx1), recto-sigmoid (HSx1)   TONSILLECTOMY     TOTAL ABDOMINAL HYSTERECTOMY     menopause/bleeding    Current Outpatient Medications  Medication Sig Dispense Refill   Accu-Chek Softclix Lancets lancets Test BS in the morning, at noon and at bedtime Dx E11.69 300 each 3   albuterol  (VENTOLIN  HFA) 108 (90 Base) MCG/ACT inhaler INHALE TWO PUFFS BY MOUTH EVERY 6 HOURS AS NEEDED FOR wheezing OR SHORTNESS OF BREATH 8.5 g 2   amLODipine  (NORVASC ) 10 MG tablet TAKE 1 TABLET BY MOUTH EVERY DAY 90 tablet 5   azelastine  (ASTELIN ) 0.1 % nasal spray Place 2 sprays into both nostrils 2 (two) times daily. Use in each nostril as directed 60 mL 3   Blood Glucose Monitoring Suppl DEVI 1 each by Does not apply route in the morning, at noon, and at bedtime. May substitute to any manufacturer covered by patient's insurance. 1 each 0   chlorpheniramine (ALLERGY RELIEF) 4 MG tablet Take 4 mg by mouth 2 (two)  times daily as needed for allergies.     clopidogrel  (PLAVIX ) 75 MG tablet Take 1 tablet (75 mg total) by mouth daily. 90 tablet 1   cyclobenzaprine  (FLEXERIL ) 5 MG tablet TAKE 1 TABLET BY MOUTH AT BEDTIME 30 tablet 0   ezetimibe  (ZETIA ) 10 MG tablet TAKE 1 TABLET BY MOUTH DAILY 90 tablet 3   Glucose Blood (BLOOD GLUCOSE TEST STRIPS) STRP 1 each by In Vitro route in the morning, at noon, and at bedtime. May substitute to any manufacturer covered by patient's insurance. 100 strip 11   Grape Seed OIL Take 1 tablet by mouth daily at 6 (six) AM.     hydrochlorothiazide  (HYDRODIURIL ) 12.5 MG tablet TAKE 1 TABLET BY MOUTH DAILY 90 tablet 3   Lancets Misc. (ACCU-CHEK SOFTCLIX LANCET DEV) KIT Test BS in the morning, at noon and at bedtime Dx E11.69 1 kit 0   montelukast  (SINGULAIR ) 10 MG tablet TAKE 1 TABLET BY MOUTH AT BEDTIME 30 tablet 3  ondansetron  (ZOFRAN ) 4 MG tablet Take 1 tablet (4 mg total) by mouth every 6 (six) hours. (Patient taking differently: Take 4 mg by mouth as needed.) 60 tablet 1   OVER THE COUNTER MEDICATION Take 1 capsule by mouth daily at 6 (six) AM. Brain essense     pantoprazole  (PROTONIX ) 40 MG tablet TAKE 1 TABLET BY MOUTH DAILY BEFORE BREAKFAST 90 tablet 1   REPATHA  SURECLICK 140 MG/ML SOAJ INJECT 1 ML into THE SKIN EVERY 14 DAYS 6 mL 3   Specialty Vitamins Products (COLLAGEN ULTRA PO) Take 60 mg by mouth 1 day or 1 dose. Collagen peptides     UNABLE TO FIND Take 1 capsule by mouth 2 (two) times daily. Cinnamon, Chromium & Biotin     UNABLE TO FIND Take 0.5 tablets by mouth daily. Curamin 1/2 tablet for pain (Patient taking differently: Take 0.5 tablets by mouth daily. Curamin 1/2 tablet for pain as needed.)     ursodiol  (ACTIGALL ) 300 MG capsule TAKE ONE CAPSULE BY MOUTH THREE TIMES DAILY 270 capsule 3   Vitamin D , Ergocalciferol , (DRISDOL ) 1.25 MG (50000 UNIT) CAPS capsule TAKE ONE CAPSULE BY MOUTH EVERY 7 DAYS 10 capsule 2   No current facility-administered medications for  this visit.    Allergies as of 07/05/2024 - Review Complete 07/05/2024  Allergen Reaction Noted   Banana Anaphylaxis 04/08/2011   Codeine Nausea And Vomiting and Other (See Comments) 08/28/2008   Stevia [stevioside] Swelling and Other (See Comments) 04/02/2016   Banana flavoring agent (non-screening) Other (See Comments) 11/08/2021   Lipitor [atorvastatin ] Other (See Comments) 12/20/2012   Statins Other (See Comments) 01/30/2022   Vicodin [hydrocodone-acetaminophen ] Nausea And Vomiting 12/20/2012   Zocor [simvastatin] Other (See Comments) 12/20/2012   Ace inhibitors Cough 11/14/2011   Actos [pioglitazone] Swelling 12/20/2012   Benicar [olmesartan medoxomil] Other (See Comments) 11/14/2011   Celebrex [celecoxib] Rash 12/20/2012   Chantix [varenicline] Other (See Comments) 12/20/2012   Diovan [valsartan] Other (See Comments) 11/14/2011   Latex Rash    Metformin  and related Other (See Comments) 02/03/2019   Mushroom extract complex (obsolete) Palpitations 12/02/2021   Penicillins Rash    Rice Other (See Comments) 12/02/2021   Spinach Other (See Comments) 02/27/2012   Sulfonamide derivatives Rash     Social History   Socioeconomic History   Marital status: Married    Spouse name: Lamar   Number of children: 0   Years of education: 16   Highest education level: Bachelor's degree (e.g., BA, AB, BS)  Occupational History   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   Tobacco Use   Smoking status: Every Day    Types: E-cigarettes    Passive exposure: Current   Smokeless tobacco: Never  Vaping Use   Vaping status: Every Day   Substances: THC, CBD, Flavoring, Nicotine -salt, Synthetic cannabinoids, Mixture of cannabinoids, Homemade substance  Substance and Sexual Activity   Alcohol  use: No    Alcohol /week: 0.0 standard drinks of alcohol    Drug use: No   Sexual activity: Not Currently  Other Topics Concern   Not on file  Social History Narrative   Married  Lamar   Limited activity due to back pain   Social Drivers of Health   Financial Resource Strain: Low Risk  (05/31/2024)   Overall Financial Resource Strain (CARDIA)    Difficulty of Paying Living Expenses: Not hard at all  Food Insecurity: No Food Insecurity (05/31/2024)   Hunger Vital Sign  Worried About Programme Researcher, Broadcasting/film/video in the Last Year: Never true    Ran Out of Food in the Last Year: Never true  Transportation Needs: No Transportation Needs (05/31/2024)   PRAPARE - Administrator, Civil Service (Medical): No    Lack of Transportation (Non-Medical): No  Physical Activity: Insufficiently Active (05/31/2024)   Exercise Vital Sign    Days of Exercise per Week: 3 days    Minutes of Exercise per Session: 20 min  Stress: No Stress Concern Present (05/31/2024)   [employer] of Occupational Health - Occupational Stress Questionnaire    Feeling of Stress: Only a little  Social Connections: Moderately Isolated (05/31/2024)   Social Connection and Isolation Panel    Frequency of Communication with Friends and Family: More than three times a week    Frequency of Social Gatherings with Friends and Family: More than three times a week    Attends Religious Services: Never    Database Administrator or Organizations: No    Attends Engineer, Structural: Never    Marital Status: Married    Review of systems General: negative for malaise, night sweats, fever, chills, weight loss Neck: Negative for lumps, goiter, pain and significant neck swelling Resp: Negative for cough, wheezing, dyspnea at rest CV: Negative for chest pain, leg swelling, palpitations, orthopnea GI: denies melena, hematochezia, nausea, vomiting, diarrhea, constipation, dysphagia, odyonophagia, early satiety or unintentional weight loss. +hemorrhoids  MSK: Negative for joint pain or swelling, back pain, and muscle pain. Derm: Negative for itching or rash Psych: Denies depression, anxiety, memory  loss, confusion. No homicidal or suicidal ideation.  Heme: Negative for prolonged bleeding, bruising easily, and swollen nodes. Endocrine: Negative for cold or heat intolerance, polyuria, polydipsia and goiter. Neuro: negative for tremor, gait imbalance, syncope and seizures. The remainder of the review of systems is noncontributory.  Physical Exam: BP (!) 155/71 (BP Location: Right Arm, Patient Position: Sitting, Cuff Size: Normal)   Pulse (!) 102   Temp 97.8 F (36.6 C) (Temporal)   Ht 5' 2 (1.575 m)   Wt 165 lb 4.8 oz (75 kg)   BMI 30.23 kg/m  General:   Alert and oriented. No distress noted. Pleasant and cooperative.  Head:  Normocephalic and atraumatic. Eyes:  Conjuctiva clear without scleral icterus. Mouth:  Oral mucosa pink and moist. Good dentition. No lesions. Heart: Normal rate and rhythm, s1 and s2 heart sounds present.  Lungs: Rhonchi in lower lobes bilaterally  Abdomen:  +BS, soft, non-tender and non-distended. No rebound or guarding. No HSM or masses noted. Derm: No palmar erythema or jaundice Msk:  Symmetrical without gross deformities. Normal posture. Extremities:  Without edema. Neurologic:  Alert and  oriented x4 Psych:  Alert and cooperative. Normal mood and affect.  Invalid input(s): 6 MONTHS   ASSESSMENT: Elizabeth [name] is a 83 y.o. female presenting today for follow up of constipation, hemorrhoids, PBC and IDA  EAR:fjpwujpwzi on Urso , has had relatively adequate response though she was lost to follow up over the past few years for ongoing management. Her last Alk phos in August was 103 (<1x ULN) with T bili 0.7, both within range.  Ultrasound elastography done in September with ongoing nodular hepatic contour, meeting K PA 3.8 actually improved from elastography in 2023, though unclear at this time she actually has cirrhosis.  She was previously asked to perform elf testing which has not yet been done.  Will do fib 4 index with reflex to elf  test for further  evaluation.  No DEXA scan since 2019 which showed osteopenia, will update DEXA scan.  Vitamin A  and vitamin D  done after last visit were within normal limits.  PIJ:ypdunmb of IDA though more recently with acutely low iron and hemoglobin, EGD and Colonoscopy recently, as outlined above with angiodysplastic colonic lesion and multiple polyps in the colon. She was recommended at last visit to start p.o. iron and referral to hematology the patient reports she was never told to start iron and never anything from hematology.  She is also recommended to have repeat CBC and iron studies earlier this month which were not done.  Last hemoglobin was 8.6 in August.  Recommend updating CBC and iron studies at this time, pending results will determine if we need to start p.o. supplemental iron or if iron infusions are also warranted.  Hemorrhoids in setting of constipation: constipation has improved with otc therapy, unclear exactly what patient is taking but husband does not think it is miralax. Notes flare of hemorrhoids when she had worsening constipation, only one time with a swipe of blood on tissue, mostly endorsing rectal discomfort. Using preparation H with some improvement but requesting something stronger to use. Encouraged them to continue with current bowel regimen, avoid straining and limit toilet time. Will send anusol to use TID.    PLAN:  -FIB 4 with reflex to ELF -AFP tumor marker -CBC, iron studies-will determine if supplemental iron or iron infusions are warranted once these result -continue Urso  300mg  TID  -DEXA scan  -continue current bowel regimen -avoid straining, limit toilet time  -anusol TID   All questions were answered, patient verbalized understanding and is in agreement with plan as outlined above.    Follow Up: 3 months   Tearia Gibbs L. Mariette, MSN, APRN, AGNP-C Adult-Gerontology Nurse Practitioner [facility]  I have reviewed the note and agree with the  APP's assessment as described in this progress note  Toribio Fortune, MD Gastroenterology and Hepatology [facility]astro[facility]

## 2024-07-05 NOTE — Patient Instructions (Signed)
-  we will update some labs, I will let you know if we need to start iron pills or do iron infusions once labs have resulted  -continue Urso  300mg  three times per day  -DEXA scan for osteoporosis  -continue stool softener -avoid straining, limit toilet time  -anusol three times per day x10 days for hemorrhoids, then as needed thereafter  Follow up 3 months  It was a pleasure to see you today. I want to create trusting relationships with patients and provide genuine, compassionate, and quality care. I truly value your feedback! please be on the lookout for a survey regarding your visit with me today. I appreciate your input about our visit and your time in completing this!    Ariyah Sedlack L. Nivia Gervase, MSN, APRN, AGNP-C Adult-Gerontology Nurse Practitioner [facility]enter[facility] at [facility]

## 2024-07-06 ENCOUNTER — Encounter: Payer: Self-pay | Admitting: Family Medicine

## 2024-07-06 ENCOUNTER — Ambulatory Visit: Payer: Self-pay | Admitting: Family Medicine

## 2024-07-06 ENCOUNTER — Ambulatory Visit: Admitting: Family Medicine

## 2024-07-06 ENCOUNTER — Telehealth: Payer: Self-pay

## 2024-07-06 ENCOUNTER — Other Ambulatory Visit: Payer: Self-pay

## 2024-07-06 VITALS — BP 122/58 | HR 90 | Temp 97.6°F | Ht 62.0 in | Wt 165.0 lb

## 2024-07-06 DIAGNOSIS — E1149 Type 2 diabetes mellitus with other diabetic neurological complication: Secondary | ICD-10-CM

## 2024-07-06 DIAGNOSIS — K743 Primary biliary cirrhosis: Secondary | ICD-10-CM | POA: Diagnosis not present

## 2024-07-06 DIAGNOSIS — R932 Abnormal findings on diagnostic imaging of liver and biliary tract: Secondary | ICD-10-CM

## 2024-07-06 DIAGNOSIS — R2681 Unsteadiness on feet: Secondary | ICD-10-CM

## 2024-07-06 DIAGNOSIS — Z23 Encounter for immunization: Secondary | ICD-10-CM

## 2024-07-06 DIAGNOSIS — D509 Iron deficiency anemia, unspecified: Secondary | ICD-10-CM

## 2024-07-06 DIAGNOSIS — I69359 Hemiplegia and hemiparesis following cerebral infarction affecting unspecified side: Secondary | ICD-10-CM

## 2024-07-06 LAB — BAYER DCA HB A1C WAIVED: HB A1C (BAYER DCA - WAIVED): 5.2 % (ref 4.8–5.6)

## 2024-07-06 NOTE — Progress Notes (Signed)
 Subjective:  Patient ID: Elizabeth [name], female    DOB: 1941-05-30  Age: 83 y.o. MRN: [id number]  CC: Medical Management of Chronic Issues (Needs new walker)   HPI  Discussed the use of AI scribe software for clinical note transcription with the patient, who gave verbal consent to proceed.  History of Present Illness Elizabeth [name] is an 83 year old female with a history of stroke who presents for follow-up regarding mobility issues and diabetes management.  She has ongoing issues with her current walker, which she received from [facility]. The walker is unstable and has caused her to fall multiple times. It was used when she received it, and she desires a more stable option, stating it 'made me fall more times than one' and 'was not very stable itself.'  She has a history of stroke resulting in weakness in both arms, more pronounced on the left side. She has received speech therapy twice but felt the sessions were inadequate, as the therapist was more focused on personal matters than her care. Additionally, a caregiver was not very safety conscious, almost causing her to fall down the steps. Her family, including her niece and grandson, have been actively involved in her care, helping with tasks such as installing bars in the bathroom.  Her recent A1c was 5.2. She mentions that '[name]' helped her manage her constipation issues, which have since been resolved. She currently uses hemorrhoid preparations for relief.          05/31/2024   11:10 AM 11/24/2023   10:36 AM 06/15/2023   10:41 AM  Depression screen PHQ 2/9  Decreased Interest 0  0  Down, Depressed, Hopeless 2  0  PHQ - 2 Score 2  0  Altered sleeping 0  0  Tired, decreased energy 2  0  Change in appetite 0  0  Feeling bad or failure about yourself    0  Trouble concentrating 0  0  Moving slowly or fidgety/restless 0  0  Suicidal thoughts 0  0  PHQ-9 Score 4  0  Difficult doing work/chores  Not difficult at all Not  difficult at all    History Adalyna has a past medical history of Absolute anemia (07/28/2018), Active smoker, Allergy, Aneurysm, Anxiety, Arthritis, Asthma, Cataract, Cerebrovascular accident (CVA) due to thrombosis of cerebral artery (HCC) (01/30/2022), 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) (01/30/2022), Chronic airway obstruction, not elsewhere classified, Clotting disorder, Depression, Diarrhea, Diverticulosis of colon (without mention of hemorrhage), Esophageal reflux, Family history of malignant neoplasm of gastrointestinal tract, GI bleed, Headache(784.0), Neuromuscular disorder (HCC), Obesity, unspecified, Other and unspecified hyperlipidemia, Personal history of colonic polyps, Stricture and stenosis of esophagus, Stroke (HCC), Thyroid  disease, Type II or unspecified type diabetes mellitus without mention of complication, not stated as uncontrolled, Unspecified asthma(493.90), and Unspecified essential hypertension.   She has a past surgical history that includes Tonsillectomy; Lumbar disc surgery; Knee arthroscopy; Appendectomy; Cardiac catheterization (2006); Cardiac catheterization; Back surgery; Cerebral angiogram (12/20/2015); Total abdominal hysterectomy; IR ANGIO INTRA EXTRACRAN SEL COM CAROTID INNOMINATE BILAT MOD SED (01/14/2018); IR ANGIO VERTEBRAL SEL SUBCLAVIAN INNOMINATE UNI L MOD SED (01/14/2018); IR ANGIO VERTEBRAL SEL VERTEBRAL UNI R MOD SED (01/14/2018); IR ANGIO VERTEBRAL SEL SUBCLAVIAN INNOMINATE UNI L MOD SED (05/11/2018); IR ANGIO VERTEBRAL SEL VERTEBRAL UNI R MOD SED (05/11/2018); IR ANGIO INTRA EXTRACRAN SEL COM CAROTID INNOMINATE BILAT MOD SED (05/11/2018); Colonoscopy (N/A, 08/09/2018); Esophagogastroduodenoscopy (N/A, 08/09/2018); Esophageal dilation (N/A, 08/09/2018); polypectomy (08/09/2018); Givens capsule study (  N/A, 08/25/2018); Brain surgery; Colonoscopy (N/A, 05/10/2024); and Esophagogastroduodenoscopy (N/A, 05/10/2024).   Her family history  includes Asthma in her mother; Cancer in her paternal grandmother and sister; Colon cancer in an other family member; Diabetes in her mother, paternal grandmother, and sister; Drug abuse in her mother; Early death in her father; Heart attack in her sister; Heart disease in her brother, maternal grandmother, mother, paternal grandfather, and sister; Hypertension in her mother and sister; Kidney disease in her mother; Lung disease in her brother; Mental illness in her paternal grandmother and sister; Stroke in her mother; Thyroid  cancer in an other family member; Tuberculosis in her maternal grandfather.She reports that she has been smoking e-cigarettes. She has been exposed to tobacco smoke. She has never used smokeless tobacco. She reports that she does not drink alcohol  and does not use drugs.    ROS Review of Systems  Constitutional: Negative.   HENT:  Negative for congestion.   Eyes:  Negative for visual disturbance.  Respiratory:  Negative for shortness of breath.   Cardiovascular:  Negative for chest pain.  Gastrointestinal:  Negative for abdominal pain, constipation, diarrhea, nausea and vomiting.  Genitourinary:  Negative for difficulty urinating.  Musculoskeletal:  Negative for arthralgias and myalgias.  Neurological:  Negative for headaches.  Psychiatric/Behavioral:  Negative for sleep disturbance.     Objective:  BP (!) 122/58   Pulse 90   Temp 97.6 F (36.4 C)   Ht 5' 2 (1.575 m)   Wt 165 lb (74.8 kg)   SpO2 99%   BMI 30.18 kg/m   BP Readings from Last 3 Encounters:  07/06/24 (!) 122/58  07/05/24 (!) 155/71  05/31/24 (!) 143/72    Wt Readings from Last 3 Encounters:  07/06/24 165 lb (74.8 kg)  07/05/24 165 lb 4.8 oz (75 kg)  05/31/24 162 lb (73.5 kg)     Physical Exam Constitutional:      General: She is not in acute distress.    Appearance: She is well-developed.  HENT:     Head: Normocephalic and atraumatic.  Eyes:     Conjunctiva/sclera: Conjunctivae  normal.     Pupils: Pupils are equal, round, and reactive to light.  Neck:     Thyroid : No thyromegaly.  Cardiovascular:     Rate and Rhythm: Normal rate and regular rhythm.     Heart sounds: Normal heart sounds. No murmur heard. Pulmonary:     Effort: Pulmonary effort is normal. No respiratory distress.     Breath sounds: Normal breath sounds. No wheezing or rales.  Abdominal:     General: Bowel sounds are normal. There is no distension.     Palpations: Abdomen is soft.     Tenderness: There is no abdominal tenderness.  Musculoskeletal:        General: Normal range of motion.     Cervical back: Normal range of motion and neck supple.  Lymphadenopathy:     Cervical: No cervical adenopathy.  Skin:    General: Skin is warm and dry.  Neurological:     Mental Status: She is alert and oriented to person, place, and time.  Psychiatric:        Behavior: Behavior normal.        Thought Content: Thought content normal.        Judgment: Judgment normal.    Physical Exam GENERAL: Alert, cooperative, well developed, no acute distress. HEENT: Normocephalic, normal oropharynx, moist mucous membranes. CHEST: Clear to auscultation bilaterally, no wheezes, rhonchi, or crackles.  CARDIOVASCULAR: Normal heart rate and rhythm, S1 and S2 normal without murmurs. ABDOMEN: Soft, non-tender, non-distended, without organomegaly, normal bowel sounds. EXTREMITIES: No cyanosis or edema. NEUROLOGICAL: Cranial nerves grossly intact, moves all extremities without gross motor or sensory deficit.   Assessment & Plan:  Type 2 diabetes mellitus with other neurologic complication, unspecified whether long term insulin  use (HCC) -     Microalbumin / creatinine urine ratio -     Bayer DCA Hb A1c Waived  Nonvisualization of gallbladder -     AFP tumor marker -     CBC with Differential/Platelet -     Iron, TIBC and Ferritin Panel -     Enhanced Liver Fibrosis (ELF) -     FIB-4  Iron deficiency anemia,  unspecified iron deficiency anemia type -     AFP tumor marker -     CBC with Differential/Platelet -     Iron, TIBC and Ferritin Panel -     Enhanced Liver Fibrosis (ELF) -     FIB-4  Cholangitic cirrhosis (HCC) -     AFP tumor marker -     CBC with Differential/Platelet -     Iron, TIBC and Ferritin Panel -     Enhanced Liver Fibrosis (ELF) -     FIB-4  Gait instability -     For home use only DME Walker platform  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 Her current walker is unstable and contributes to falls. Prescribe a new walker, preferably with a platform for stability and convenience. Advise obtaining the walker from [facility] or a similar provider that can bill Medicare. Ensure the walker prescription includes the cerebrovascular accident and resulting deficits.  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 Continue current diabetes management regimen.  Essential hypertension   Blood pressure is well-controlled.  Constipation   Previous issues with constipation have been resolved.  Hemorrhoids   Currently managing hemorrhoids with over-the-counter preparation.       Follow-up: Return in about 3 months (around 10/06/2024) for diabetes.  Butler Der, M.D.

## 2024-07-06 NOTE — Telephone Encounter (Signed)
 I called and spoke with patient and she said she does need to get her lab work done again for the GI doctor but wanted to know if she could do her lab work here at [facility] and we send her results to GI. I confirmed that we can do that and the lab orders from GI are visible in her chart. Patient is scheduled to be seen today by PCP and will do lab work while here.    Copied from CRM [id number]. Topic: Medical Record Request - Other >> Jul 05, 2024  4:49 PM Mercer PEDLAR wrote: Reason for CRM: Patient stated that she was seen at [facility]  by Mitzie LITTIE Boettcher, NP today 07/05/24, and they wanted her to get labs done. She is requesting that her lab results are sent to her GI doctor's office so she does not have to do labs again.

## 2024-07-07 ENCOUNTER — Ambulatory Visit: Payer: Self-pay

## 2024-07-07 LAB — FIB-4
ALT: 21 IU/L (ref 0–32)
AST: 30 IU/L (ref 0–40)
FIB-4 Index: 1.41 (ref 0.00–2.67)
Platelets: 377 x10E3/uL (ref 150–450)

## 2024-07-07 NOTE — Telephone Encounter (Signed)
 FYI Only or Action Required?: FYI only for provider: fyi only.  Patient was last seen in primary care on 07/06/2024 by Zollie Lowers, MD.  Called Nurse Triage reporting Influenza.  Symptoms began today.  Interventions attempted: OTC medications: benadryl .  Symptoms are: unchanged.  Triage Disposition: Home Care  Patient/caregiver understands and will follow disposition?: Yes     Copied from CRM #[id number]. Topic: Clinical - Medication Question >> Jul 07, 2024  5:08 PM Delon HERO wrote: Reason for CRM: Patient is calling to report congestion and has taken cholestan and benadryl  had appointment yesterday. Requesting medication [facility] [place], [place] - [address]  [place] [address] Phone: [phone] Fax: [phone] Hours: Not open 24 hours Reason for Disposition  Influenza (Injection; Quadrivalent or Trivalent) injected vaccine reactions  Answer Assessment - Initial Assessment Questions Pt states that she woke up with nasal congestion this am. She states she took a chloratab this am and a benadryl  a little bit ago and that typically works. She asked about ordering a nose wash. Rn advised she can get those over the counter. She said if that doesn't work she will call us  back.     1. SYMPTOMS: What is the main symptom? (e.g., pain, redness, or swelling at injection site; feeling tired, fever, muscle aches)      Nasal congestion 2. ONSET: When was the vaccine (shot) given? How much later did the nasal congestion begin? (e.g., hours, days ago)      today 3. SEVERITY: How bad is it?      mod 4. FEVER: Do you have a fever? If Yes, ask: What is your temperature, how was it measured, and when did it start?      no 5. IMMUNIZATIONS GIVEN: What shots have you recently received?     flu  7. OTHER SYMPTOMS: Do you have any other symptoms?     no  Protocols used: Immunization Reactions-A-AH

## 2024-07-08 ENCOUNTER — Inpatient Hospital Stay (HOSPITAL_COMMUNITY): Admission: RE | Admit: 2024-07-08

## 2024-07-08 LAB — IRON,TIBC AND FERRITIN PANEL
Ferritin: 7 ng/mL — ABNORMAL LOW (ref 15–150)
Iron Saturation: 2 % — CL (ref 15–55)
Iron: 11 ug/dL — ABNORMAL LOW (ref 27–139)
Total Iron Binding Capacity: 517 ug/dL — ABNORMAL HIGH (ref 250–450)
UIBC: 506 ug/dL — ABNORMAL HIGH (ref 118–369)

## 2024-07-08 LAB — CBC WITH DIFFERENTIAL/PLATELET
Basophils Absolute: 0.1 x10E3/uL (ref 0.0–0.2)
Basos: 1 %
EOS (ABSOLUTE): 0.1 x10E3/uL (ref 0.0–0.4)
Eos: 2 %
Hematocrit: 29.3 % — ABNORMAL LOW (ref 34.0–46.6)
Hemoglobin: 7.7 g/dL — CL (ref 11.1–15.9)
Immature Grans (Abs): 0 x10E3/uL (ref 0.0–0.1)
Immature Granulocytes: 0 %
Lymphocytes Absolute: 1.1 x10E3/uL (ref 0.7–3.1)
Lymphs: 17 %
MCH: 17.7 pg — ABNORMAL LOW (ref 26.6–33.0)
MCHC: 26.3 g/dL — ABNORMAL LOW (ref 31.5–35.7)
MCV: 68 fL — ABNORMAL LOW (ref 79–97)
Monocytes Absolute: 0.4 x10E3/uL (ref 0.1–0.9)
Monocytes: 7 %
Neutrophils Absolute: 4.6 x10E3/uL (ref 1.4–7.0)
Neutrophils: 73 %
Platelets: 357 x10E3/uL (ref 150–450)
RBC: 4.34 x10E6/uL (ref 3.77–5.28)
RDW: 18.7 % — ABNORMAL HIGH (ref 11.7–15.4)
WBC: 6.3 x10E3/uL (ref 3.4–10.8)

## 2024-07-08 LAB — ENHANCED LIVER FIBROSIS (ELF): ELF(TM) Score: 11.22 — ABNORMAL HIGH (ref <9.80)

## 2024-07-08 LAB — AFP TUMOR MARKER: AFP, Serum, Tumor Marker: 4.4 ng/mL (ref 0.0–8.7)

## 2024-07-08 NOTE — Telephone Encounter (Signed)
 Noted

## 2024-07-12 NOTE — Progress Notes (Signed)
 Referral sent, they will contact patient with apt

## 2024-07-13 ENCOUNTER — Encounter (INDEPENDENT_AMBULATORY_CARE_PROVIDER_SITE_OTHER): Payer: Self-pay

## 2024-07-18 ENCOUNTER — Telehealth (INDEPENDENT_AMBULATORY_CARE_PROVIDER_SITE_OTHER): Payer: Self-pay

## 2024-07-18 NOTE — Telephone Encounter (Signed)
 Patient called with complaint of constipation  When did the constipation begin?: Patient has had the issue ongoing for a week now.   Is this a new problem or an ongoing problem?: New problem  Do you have abdominal pain associated with this?: Some abdominal pain periumbilical pain below the umbilical area    Describe constipation/stools ( Hard? Soft? Incomplete bowel movement? Straining?):   Stool color: Dark, reports stools are so hard it causes rectal pain  How often are you having bowel movements? (How many days without a BM?): one to two times per day,usually very hard she can not get them out  What has helped?: She is taking miralax once per day, and this helps some.  What has been tried previously and did it help? Samples?: No  Has anything made it worse?: No  What other symptoms are associated with it? (Nausea?  Bloating?  Rectal pain?,  Rectal bleeding?):  She has some issues with Nausea, and rectal pain, no bright red blood seen, stools are dark    Current pharmacy/Preferred pharmacy: [facility] Last office visit: 07/05/2024 by Mitzie Boettcher, NP

## 2024-07-18 NOTE — Telephone Encounter (Signed)
 I spoke with the patient and she says, yes, she did start po FE two pills daily. I made her aware to increase the Miralax to Three capfuls per day. Patient states understanding and says she will do her best with the miralax dosing.

## 2024-07-20 ENCOUNTER — Inpatient Hospital Stay: Attending: Oncology | Admitting: Oncology

## 2024-07-20 VITALS — BP 144/45 | HR 83 | Temp 98.4°F | Resp 18 | Ht 62.0 in | Wt 165.0 lb

## 2024-07-20 DIAGNOSIS — D500 Iron deficiency anemia secondary to blood loss (chronic): Secondary | ICD-10-CM

## 2024-07-20 DIAGNOSIS — D509 Iron deficiency anemia, unspecified: Secondary | ICD-10-CM | POA: Insufficient documentation

## 2024-07-20 NOTE — Assessment & Plan Note (Addendum)
 The most likely cause of her anemia is due to chronic blood loss.  Lab Results  Component Value Date   IRON 11 (L) 07/06/2024   TIBC 517 (H) 07/06/2024   FERRITIN 7 (L) 07/06/2024    Last colonoscopy/endoscopy: 05/10/24.   -We discussed some of the risks, benefits, and alternatives of intravenous iron infusions. The patient is symptomatic from anemia and the iron level is critically low. She tolerated oral iron supplement poorly and desires to achieve higher levels of iron faster for adequate hematopoesis. Some of the side-effects to be expected including risks of infusion reactions, phlebitis, headaches, nausea and fatigue.  The patient is willing to proceed. Patient education material was dispensed. Goal is to keep ferritin level greater than 50 and resolution of anemia -Continue oral iron every other day.  Use MiraLAX for constipation  Based on labs, would recommend 2 doses of IV Feraheme . Tolerated IV Feraheme  well back in 2022.  Return to clinic in 6 weeks with labs to assess response to IV iron

## 2024-07-20 NOTE — Progress Notes (Signed)
 [facility] at [facility]  HEMATOLOGY NEW VISIT  Elizabeth Lowers, MD  REASON FOR REFERRAL: IDA  SUMMARY OF HEMATOLOGIC HISTORY: Patient is an 83 year old female with past medical history significant for stroke with residual left-sided weakness, diabetes, primary biliary cholangitis, constipation, hemorrhoids, asthma, depression, hypertension, hypothyroidism and iron deficiency anemia.  Upon chart review, patient has been iron deficient with iron saturations less than 10% for greater than 3 years along with a ferritin less than 7%.  Hemoglobin has been less than 8 since March 2025 but she has been intermittently anemic since October 2020.  In August, patient was instructed to go to the emergency room for hemoglobin of 6.3 where she received a unit of PRBC.  Platelets have been more or less stable with occasional elevation likely secondary to iron deficiency.  MCV 68.  Reports occasional bleeding when she has bowel movements.  She is currently on iron supplements twice per day.  Colonoscopy from 05/10/24 showed single nonbleeding colonic angiodysplastic lesion treated with argon plasma coagulation.  Eighteen 2 to 10 mm polyps in transverse colon and a 4 mm polyp in descending colon.  Diverticulosis in sigmoid colon.  Pathology showed tubular adenoma, inflammation but negative for dysplasia.  EGD showed 1 cm hiatal hernia, patchy mildly erythematous mucosa without bleeding in gastric antrum.  In the past, patient has received IV iron last given on 11/19/2020 and 11/26/2020.  She received 510 mg IV Feraheme .  Patient reports a little bit of bright red blood per rectum last week but only 1 episode.  She does have hemorrhoids.  Reports some straining at times.  Reports decreased energy levels and she has been sleeping more.  Appetite has been fair.  No ice pica.  HISTORY OF PRESENT ILLNESS: Elizabeth [name] 83 y.o. female referred for IDA. The patient is accompanied by her  husband.   I have reviewed the past medical history, past surgical history, social history and family history with the patient   ALLERGIES:  is allergic to banana, codeine, stevia [stevioside], banana flavoring agent (non-screening), lipitor [atorvastatin ], statins, vicodin [hydrocodone-acetaminophen ], zocor [simvastatin], ace inhibitors, actos [pioglitazone], benicar [olmesartan medoxomil], celebrex [celecoxib], chantix [varenicline], diovan [valsartan], latex, metformin  and related, mushroom extract complex (obsolete), penicillins, rice, spinach, and sulfonamide derivatives.  MEDICATIONS:  Current Outpatient Medications  Medication Sig Dispense Refill   Accu-Chek Softclix Lancets lancets Test BS in the morning, at noon and at bedtime Dx E11.69 300 each 3   albuterol  (VENTOLIN  HFA) 108 (90 Base) MCG/ACT inhaler INHALE TWO PUFFS BY MOUTH EVERY 6 HOURS AS NEEDED FOR wheezing OR SHORTNESS OF BREATH 8.5 g 2   amLODipine  (NORVASC ) 10 MG tablet TAKE 1 TABLET BY MOUTH EVERY DAY 90 tablet 5   azelastine  (ASTELIN ) 0.1 % nasal spray Place 2 sprays into both nostrils 2 (two) times daily. Use in each nostril as directed 60 mL 3   Blood Glucose Monitoring Suppl DEVI 1 each by Does not apply route in the morning, at noon, and at bedtime. May substitute to any manufacturer covered by patient's insurance. 1 each 0   chlorpheniramine (ALLERGY RELIEF) 4 MG tablet Take 4 mg by mouth 2 (two) times daily as needed for allergies.     clopidogrel  (PLAVIX ) 75 MG tablet Take 1 tablet (75 mg total) by mouth daily. 90 tablet 1   cyclobenzaprine  (FLEXERIL ) 5 MG tablet TAKE 1 TABLET BY MOUTH AT BEDTIME 30 tablet 0   ezetimibe  (ZETIA ) 10 MG tablet TAKE 1 TABLET BY MOUTH DAILY  90 tablet 3   ferrous sulfate  324 MG TBEC Take 324 mg by mouth 2 (two) times daily. Per Mitzie Boettcher, NP start as of 07/12/2024.     Glucose Blood (BLOOD GLUCOSE TEST STRIPS) STRP 1 each by In Vitro route in the morning, at noon, and at bedtime. May  substitute to any manufacturer covered by patient's insurance. 100 strip 11   Grape Seed OIL Take 1 tablet by mouth daily at 6 (six) AM.     hydrochlorothiazide  (HYDRODIURIL ) 12.5 MG tablet TAKE 1 TABLET BY MOUTH DAILY 90 tablet 3   hydrocortisone (ANUSOL-HC) 2.5 % rectal cream Place 1 Application rectally 3 (three) times daily. 56 g 1   Lancets Misc. (ACCU-CHEK SOFTCLIX LANCET DEV) KIT Test BS in the morning, at noon and at bedtime Dx E11.69 1 kit 0   montelukast  (SINGULAIR ) 10 MG tablet TAKE 1 TABLET BY MOUTH AT BEDTIME 30 tablet 3   ondansetron  (ZOFRAN ) 4 MG tablet Take 1 tablet (4 mg total) by mouth every 6 (six) hours. 60 tablet 1   OVER THE COUNTER MEDICATION Take 1 capsule by mouth daily at 6 (six) AM. Brain essense     pantoprazole  (PROTONIX ) 40 MG tablet TAKE 1 TABLET BY MOUTH DAILY BEFORE BREAKFAST 90 tablet 1   REPATHA  SURECLICK 140 MG/ML SOAJ INJECT 1 ML into THE SKIN EVERY 14 DAYS 6 mL 3   Specialty Vitamins Products (COLLAGEN ULTRA PO) Take 60 mg by mouth 1 day or 1 dose. Collagen peptides     UNABLE TO FIND Take 1 capsule by mouth 2 (two) times daily. Cinnamon, Chromium & Biotin     UNABLE TO FIND Take 0.5 tablets by mouth daily. Curamin 1/2 tablet for pain     ursodiol  (ACTIGALL ) 300 MG capsule TAKE ONE CAPSULE BY MOUTH THREE TIMES DAILY 270 capsule 3   Vitamin D , Ergocalciferol , (DRISDOL ) 1.25 MG (50000 UNIT) CAPS capsule TAKE ONE CAPSULE BY MOUTH EVERY 7 DAYS 10 capsule 2   No current facility-administered medications for this visit.     REVIEW OF SYSTEMS:   Review of Systems  Constitutional:  Positive for malaise/fatigue. Negative for weight loss.  Gastrointestinal:  Positive for blood in stool. Negative for abdominal pain and diarrhea.  Musculoskeletal:  Negative for falls and joint pain.  Neurological:  Positive for weakness.     PHYSICAL EXAMINATION:   There were no vitals filed for this visit.  GENERAL:alert, no distress and comfortable SKIN: skin color,  texture, turgor are normal, no rashes or significant lesions EYES: normal, Conjunctiva are pink and non-injected, sclera clear OROPHARYNX:no exudate, no erythema and lips, buccal mucosa, and tongue normal  NECK: supple, thyroid  normal size, non-tender, without nodularity LYMPH:  no palpable lymphadenopathy in the cervical, axillary or inguinal LUNGS: clear to auscultation and percussion with normal breathing effort HEART: regular rate & rhythm and no murmurs and no lower extremity edema ABDOMEN:abdomen soft, non-tender and normal bowel sounds Musculoskeletal:no cyanosis of digits and no clubbing  NEURO: alert & oriented x 3 with some garbled speech, Left sided weakness.   LABORATORY DATA:  I have reviewed the data as listed  Lab Results  Component Value Date   WBC 6.3 07/06/2024   NEUTROABS 4.6 07/06/2024   HGB 7.7 (LL) 07/06/2024   HCT 29.3 (L) 07/06/2024   MCV 68 (L) 07/06/2024   PLT 357 07/06/2024    No results found for: SPEP, UPEP     Chemistry      Component Value Date/Time  NA 135 04/20/2024 1503   NA 135 03/02/2024 0908   K 3.3 (L) 04/20/2024 1503   CL 98 04/20/2024 1503   CO2 23 04/20/2024 1503   BUN 11 04/20/2024 1503   BUN 10 03/02/2024 0908   CREATININE 0.72 04/20/2024 1503   CREATININE 0.71 04/18/2024 1215      Component Value Date/Time   CALCIUM  9.8 04/20/2024 1503   ALKPHOS 103 04/20/2024 1503   AST 30 07/06/2024 1139   ALT 21 07/06/2024 1139   BILITOT 0.7 04/20/2024 1503   BILITOT 0.4 03/02/2024 0908       RADIOGRAPHIC STUDIES: I have personally reviewed the radiological images as listed and agreed with the findings in the report. US  ELASTOGRAPHY LIVER CLINICAL DATA:  History of primary biliary cholangitis  EXAM: US  ELASTOGRAPHY HEPATIC  TECHNIQUE: Sonography of the liver was performed. In addition, ultrasound elastography evaluation of the liver was performed. A region of interest was placed within the right lobe of the liver.  Following application of a compressive sonographic pulse, tissue compressibility was assessed. Multiple assessments were performed at the selected site. Median tissue compressibility was determined. Previously, hepatic stiffness was assessed by shear wave velocity. Based on recently published Society of Radiologists in Ultrasound consensus article, reporting is now recommended to be performed in the SI units of pressure (kiloPascals) representing hepatic stiffness/elasticity. The obtained result is compared to the published reference standards. (cACLD = compensated Advanced Chronic Liver Disease)  COMPARISON:  Ultrasound elastography dated 01/13/2022  FINDINGS: Liver: Nodular hepatic contour. No focal hepatic lesions. Portal vein is patent on color Doppler imaging with normal direction of blood flow towards the liver.  Partially imaged right renal cyst.  ULTRASOUND HEPATIC ELASTOGRAPHY  Device: Siemens Helix VTQ  Patient position: Oblique  Transducer: DAX  Number of measurements: 13  Hepatic segment:  8  Median kPa: 3.8  IQR: 3.5  IQR/Median kPa ratio: 0.92  Data quality: IQR/Median kPa ratio of 0.6 or greater when using the DAX probe indicates reduced accuracy  Diagnostic category:  < or = 5 kPa: high probability of being normal  The use of hepatic elastography is applicable to patients with viral hepatitis and non-alcoholic fatty liver disease. At this time, there is insufficient data for the referenced cut-off values and use in other causes of liver disease, including alcoholic liver disease. Patients, however, may be assessed by elastography and serve as their own reference standard/baseline.  In patients with non-alcoholic liver disease, the values suggesting compensated advanced chronic liver disease (cACLD) may be lower, and patients may need additional testing with elasticity results of 7-9 kPa.  Please note that abnormal hepatic elasticity and shear  wave velocities may also be identified in clinical settings other than with hepatic fibrosis, such as: acute hepatitis, elevated right heart and central venous pressures including use of beta blockers, veno-occlusive disease (Budd-Chiari), infiltrative processes such as mastocytosis/amyloidosis/infiltrative tumor/lymphoma, extrahepatic cholestasis, with hyperemia in the post-prandial state, and with liver transplantation. Correlation with patient history, laboratory data, and clinical condition recommended.  Diagnostic Categories:  < or =5 kPa: high probability of being normal  < or =9 kPa: in the absence of other known clinical signs, rules out cACLD  >9 kPa and ?13 kPa: suggestive of cACLD, but needs further testing  >13 kPa: highly suggestive of cACLD  > or =17 kPa: highly suggestive of cACLD with an increased probability of clinically significant portal hypertension  IMPRESSION: ULTRASOUND LIVER:  Nodular hepatic contour, which can be seen in the setting of cirrhosis.  ULTRASOUND HEPATIC ELASTOGRAPHY:  Median kPa: 3.8 noting suboptimal data quality resulting in reduced accuracy  Diagnostic category:  < or = 5 kPa: high probability of being normal  In the setting of elevated liver function tests, non-fasting state, or vascular congestion, the stage of liver fibrosis may be overestimated. In some patients with NAFLD, the cut-off values for cACLD may be lower (7-9 kPa). In causes other than viral hepatitis and NAFLD, the cut-off values are not well established.  Electronically Signed   By: Limin  Xu M.D.   On: 05/17/2024 17:00   ASSESSMENT & PLAN:  Patient is a 83 y.o. female referred for iron deficiency anemia.  Assessment & Plan Iron deficiency anemia due to chronic blood loss The most likely cause of her anemia is due to chronic blood loss.  Lab Results  Component Value Date   IRON 11 (L) 07/06/2024   TIBC 517 (H) 07/06/2024   FERRITIN 7 (L) 07/06/2024     Last colonoscopy/endoscopy: 05/10/24.   -We discussed some of the risks, benefits, and alternatives of intravenous iron infusions. The patient is symptomatic from anemia and the iron level is critically low. She tolerated oral iron supplement poorly and desires to achieve higher levels of iron faster for adequate hematopoesis. Some of the side-effects to be expected including risks of infusion reactions, phlebitis, headaches, nausea and fatigue.  The patient is willing to proceed. Patient education material was dispensed. Goal is to keep ferritin level greater than 50 and resolution of anemia -Continue oral iron every other day.  Use MiraLAX for constipation  Based on labs, would recommend 2 doses of IV Feraheme . Tolerated IV Feraheme  well back in 2022.  Return to clinic in 6 weeks with labs to assess response to IV iron    Orders Placed This Encounter  Procedures   Ferritin    Standing Status:   Future    Expected Date:   08/31/2024    Expiration Date:   11/29/2024   CBC with Differential/Platelet    Standing Status:   Future    Expected Date:   08/31/2024    Expiration Date:   11/29/2024   Copper , serum    Standing Status:   Future    Expected Date:   08/31/2024    Expiration Date:   11/29/2024   Vitamin B12    Standing Status:   Future    Expected Date:   08/31/2024    Expiration Date:   11/29/2024   Methylmalonic acid, serum    Standing Status:   Future    Expected Date:   08/31/2024    Expiration Date:   11/29/2024   Iron and TIBC    Standing Status:   Future    Expected Date:   08/31/2024    Expiration Date:   11/29/2024   Folate    Standing Status:   Future    Expected Date:   08/31/2024    Expiration Date:   11/29/2024    The total time spent in the appointment was 40 minutes encounter with patients including review of chart and various tests results, discussions about plan of care and coordination of care plan   All questions were answered. The patient knows to call  the clinic with any problems, questions or concerns. No barriers to learning was detected.   Delon FORBES Hope, NP 11/12/20258:24 AM

## 2024-07-21 NOTE — Addendum Note (Signed)
 Addended by: Ida Uppal on: 07/21/2024 01:00 PM   Modules accepted: Level of Service

## 2024-07-22 ENCOUNTER — Other Ambulatory Visit: Payer: Self-pay | Admitting: Family Medicine

## 2024-07-22 DIAGNOSIS — I6330 Cerebral infarction due to thrombosis of unspecified cerebral artery: Secondary | ICD-10-CM

## 2024-07-22 DIAGNOSIS — K219 Gastro-esophageal reflux disease without esophagitis: Secondary | ICD-10-CM

## 2024-07-22 NOTE — Progress Notes (Signed)
 Patient called stating for past 5 days she has been having bowel movements in her pants before she can get to a bathroom. Looked back in chart and seen where patient was directed to take miralax per gastro Dr on 11/10. Patient reports she is not currently taking this anymore, informed patient to contact her gastroenterologist to manage this. Patient verbalized understanding.

## 2024-07-27 ENCOUNTER — Inpatient Hospital Stay

## 2024-07-27 VITALS — BP 125/64 | HR 72 | Temp 97.3°F | Resp 18

## 2024-07-27 DIAGNOSIS — D509 Iron deficiency anemia, unspecified: Secondary | ICD-10-CM | POA: Diagnosis not present

## 2024-07-27 MED ORDER — FAMOTIDINE 20 MG PO TABS
20.0000 mg | ORAL_TABLET | Freq: Once | ORAL | Status: AC
  Administered 2024-07-27: 20 mg via ORAL
  Filled 2024-07-27: qty 1

## 2024-07-27 MED ORDER — SODIUM CHLORIDE 0.9 % IV SOLN: Freq: Once | INTRAVENOUS | Status: AC

## 2024-07-27 MED ORDER — SODIUM CHLORIDE 0.9 % IV SOLN
510.0000 mg | Freq: Once | INTRAVENOUS | Status: AC
  Administered 2024-07-27: 510 mg via INTRAVENOUS
  Filled 2024-07-27: qty 510

## 2024-07-27 MED ORDER — CETIRIZINE HCL 10 MG PO TABS
10.0000 mg | ORAL_TABLET | Freq: Once | ORAL | Status: AC
  Administered 2024-07-27: 10 mg via ORAL
  Filled 2024-07-27: qty 1

## 2024-07-27 NOTE — Patient Instructions (Signed)
 [facility] [place] - A DEPT OF [facility]  Discharge Instructions: Thank you for choosing [facility] to provide your oncology and hematology care.  If you have a lab appointment with the Cancer Center - please note that after April 8th, 2024, all labs will be drawn in the cancer center.  You do not have to check in or register with the main entrance as you have in the past but will complete your check-in in the cancer center.  Wear comfortable clothing and clothing appropriate for easy access to any Portacath or PICC line.   We strive to give you quality time with your provider. You may need to reschedule your appointment if you arrive late (15 or more minutes).  Arriving late affects you and other patients whose appointments are after yours.  Also, if you miss three or more appointments without notifying the office, you may be dismissed from the clinic at the provider's discretion.      For prescription refill requests, have your pharmacy contact our office and allow 72 hours for refills to be completed.    Today you received the following iron infusion: feraheme   To help prevent nausea and vomiting after your treatment, we encourage you to take your nausea medication as directed.  BELOW ARE SYMPTOMS THAT SHOULD BE REPORTED IMMEDIATELY: *FEVER GREATER THAN 100.4 F (38 C) OR HIGHER *CHILLS OR SWEATING *NAUSEA AND VOMITING THAT IS NOT CONTROLLED WITH YOUR NAUSEA MEDICATION *UNUSUAL SHORTNESS OF BREATH *UNUSUAL BRUISING OR BLEEDING *URINARY PROBLEMS (pain or burning when urinating, or frequent urination) *BOWEL PROBLEMS (unusual diarrhea, constipation, pain near the anus) TENDERNESS IN MOUTH AND THROAT WITH OR WITHOUT PRESENCE OF ULCERS (sore throat, sores in mouth, or a toothache) UNUSUAL RASH, SWELLING OR PAIN  UNUSUAL VAGINAL DISCHARGE OR ITCHING   Items with * indicate a potential emergency and should be followed up as soon as possible or go to  the Emergency Department if any problems should occur.  Please show the CHEMOTHERAPY ALERT CARD or IMMUNOTHERAPY ALERT CARD at check-in to the Emergency Department and triage nurse.  Should you have questions after your visit or need to cancel or reschedule your appointment, please contact [facility] [place] - A DEPT OF [facility] [phone]  and follow the prompts.  Office hours are 8:00 a.m. to 4:30 p.m. Monday - Friday. Please note that voicemails left after 4:00 p.m. may not be returned until the following business day.  We are closed weekends and major holidays. You have access to a nurse at all times for urgent questions. Please call the main number to the clinic [phone] and follow the prompts.  For any non-urgent questions, you may also contact your provider using MyChart. We now offer e-Visits for anyone 64 and older to request care online for non-urgent symptoms. For details visit [email or web address].   Also download the MyChart app! Go to the app store, search "MyChart", open the app, select [place], and log in with your MyChart username and password.

## 2024-07-27 NOTE — Progress Notes (Signed)
Feraheme iron infusion given per orders. Patient tolerated it well without problems. Vitals stable and discharged home from clinic via wheelchair. Follow up as scheduled.

## 2024-07-28 ENCOUNTER — Ambulatory Visit (INDEPENDENT_AMBULATORY_CARE_PROVIDER_SITE_OTHER): Admitting: Gastroenterology

## 2024-07-28 LAB — LAB REPORT - SCANNED: Microalb Creat Ratio: 35

## 2024-08-01 ENCOUNTER — Other Ambulatory Visit: Payer: Self-pay | Admitting: Family Medicine

## 2024-08-01 DIAGNOSIS — I6330 Cerebral infarction due to thrombosis of unspecified cerebral artery: Secondary | ICD-10-CM

## 2024-08-02 ENCOUNTER — Other Ambulatory Visit: Payer: Self-pay | Admitting: *Deleted

## 2024-08-02 DIAGNOSIS — I6330 Cerebral infarction due to thrombosis of unspecified cerebral artery: Secondary | ICD-10-CM

## 2024-08-02 MED ORDER — CLOPIDOGREL BISULFATE 75 MG PO TABS: 75.0000 mg | ORAL_TABLET | Freq: Every day | ORAL | 3 refills | Status: AC

## 2024-08-02 NOTE — Progress Notes (Signed)
 Elizabeth [name]                                          MRN: [id number]   08/02/2024   The [facility] Quality Team Specialist reviewed this patient medical record for the purposes of chart review for care gap closure. The following were reviewed: chart review for care gap closure-kidney health evaluation for diabetes:eGFR  and uACR.    [facility]BCI Quality Team

## 2024-08-03 ENCOUNTER — Inpatient Hospital Stay

## 2024-08-03 VITALS — BP 138/60 | HR 77 | Temp 98.0°F | Resp 18

## 2024-08-03 DIAGNOSIS — D509 Iron deficiency anemia, unspecified: Secondary | ICD-10-CM | POA: Diagnosis not present

## 2024-08-03 MED ORDER — CETIRIZINE HCL 10 MG PO TABS
10.0000 mg | ORAL_TABLET | Freq: Once | ORAL | Status: AC
  Administered 2024-08-03: 10 mg via ORAL
  Filled 2024-08-03: qty 1

## 2024-08-03 MED ORDER — SODIUM CHLORIDE 0.9 % IV SOLN
510.0000 mg | Freq: Once | INTRAVENOUS | Status: AC
  Administered 2024-08-03: 510 mg via INTRAVENOUS
  Filled 2024-08-03: qty 510

## 2024-08-03 MED ORDER — SODIUM CHLORIDE 0.9 % IV SOLN: Freq: Once | INTRAVENOUS | Status: AC

## 2024-08-03 MED ORDER — FAMOTIDINE 20 MG PO TABS
20.0000 mg | ORAL_TABLET | Freq: Once | ORAL | Status: AC
  Administered 2024-08-03: 20 mg via ORAL
  Filled 2024-08-03: qty 1

## 2024-08-03 NOTE — Progress Notes (Signed)
 Patient presents today for Feraheme  infusion. Vital signs stable. Patient denies any side effects related to last iron infusion. Pre-medications given prior to iron.   Feraheme  given today per MD orders. Tolerated infusion without adverse affects. Vital signs stable. No complaints at this time. Discharged from clinic by wheel chair in stable condition. Alert and oriented x 3. F/U with [facility] as scheduled.

## 2024-08-03 NOTE — Patient Instructions (Signed)
 [facility] [place] - A DEPT OF [facility]  Discharge Instructions: Thank you for choosing [facility] to provide your oncology and hematology care.  If you have a lab appointment with the Cancer Center - please note that after April 8th, 2024, all labs will be drawn in the cancer center.  You do not have to check in or register with the main entrance as you have in the past but will complete your check-in in the cancer center.  Wear comfortable clothing and clothing appropriate for easy access to any Portacath or PICC line.   We strive to give you quality time with your provider. You may need to reschedule your appointment if you arrive late (15 or more minutes).  Arriving late affects you and other patients whose appointments are after yours.  Also, if you miss three or more appointments without notifying the office, you may be dismissed from the clinic at the provider's discretion.      For prescription refill requests, have your pharmacy contact our office and allow 72 hours for refills to be completed.    Today you received the following chemotherapy and/or immunotherapy agents feraheme. Ferumoxytol Injection What is this medication? FERUMOXYTOL (FER ue MOX i tol) treats low levels of iron in your body (iron deficiency anemia). Iron is a mineral that plays an important role in making red blood cells, which carry oxygen from your lungs to the rest of your body. This medicine may be used for other purposes; ask your health care provider or pharmacist if you have questions. COMMON BRAND NAME(S): Feraheme What should I tell my care team before I take this medication? They need to know if you have any of these conditions: Anemia not caused by low iron levels High levels of iron in the blood Magnetic resonance imaging (MRI) test scheduled An unusual or allergic reaction to iron, other medications, foods, dyes, or preservatives Pregnant or trying to get  pregnant Breastfeeding How should I use this medication? This medication is injected into a vein. It is given by your care team in a hospital or clinic setting. Talk to your care team the use of this medication in children. Special care may be needed. Overdosage: If you think you have taken too much of this medicine contact a poison control center or emergency room at once. NOTE: This medicine is only for you. Do not share this medicine with others. What if I miss a dose? It is important not to miss your dose. Call your care team if you are unable to keep an appointment. What may interact with this medication? Other iron products This list may not describe all possible interactions. Give your health care provider a list of all the medicines, herbs, non-prescription drugs, or dietary supplements you use. Also tell them if you smoke, drink alcohol, or use illegal drugs. Some items may interact with your medicine. What should I watch for while using this medication? Visit your care team for regular checks on your progress. Tell your care team if your symptoms do not start to get better or if they get worse. You may need blood work done while you are taking this medication. You may need to eat more foods that contain iron. Talk to your care team. Foods that contain iron include whole grains or cereals, dried fruits, beans, peas, leafy green vegetables, and organ meats (liver, kidney). What side effects may I notice from receiving this medication? Side effects that  you should report to your care team as soon as possible: Allergic reactions--skin rash, itching, hives, swelling of the face, lips, tongue, or throat Low blood pressure--dizziness, feeling faint or lightheaded, blurry vision Shortness of breath Side effects that usually do not require medical attention (report to your care team if they continue or are bothersome): Flushing Headache Joint pain Muscle pain Nausea Pain, redness, or  irritation at injection site This list may not describe all possible side effects. Call your doctor for medical advice about side effects. You may report side effects to FDA at 1-800-FDA-1088. Where should I keep my medication? This medication is given in a hospital or clinic. It will not be stored at home. NOTE: This sheet is a summary. It may not cover all possible information. If you have questions about this medicine, talk to your doctor, pharmacist, or health care provider.  2024 Elsevier/Gold Standard (2023-04-15 00:00:00)      To help prevent nausea and vomiting after your treatment, we encourage you to take your nausea medication as directed.  BELOW ARE SYMPTOMS THAT SHOULD BE REPORTED IMMEDIATELY: *FEVER GREATER THAN 100.4 F (38 C) OR HIGHER *CHILLS OR SWEATING *NAUSEA AND VOMITING THAT IS NOT CONTROLLED WITH YOUR NAUSEA MEDICATION *UNUSUAL SHORTNESS OF BREATH *UNUSUAL BRUISING OR BLEEDING *URINARY PROBLEMS (pain or burning when urinating, or frequent urination) *BOWEL PROBLEMS (unusual diarrhea, constipation, pain near the anus) TENDERNESS IN MOUTH AND THROAT WITH OR WITHOUT PRESENCE OF ULCERS (sore throat, sores in mouth, or a toothache) UNUSUAL RASH, SWELLING OR PAIN  UNUSUAL VAGINAL DISCHARGE OR ITCHING   Items with * indicate a potential emergency and should be followed up as soon as possible or go to the Emergency Department if any problems should occur.  Please show the CHEMOTHERAPY ALERT CARD or IMMUNOTHERAPY ALERT CARD at check-in to the Emergency Department and triage nurse.  Should you have questions after your visit or need to cancel or reschedule your appointment, please contact [facility] [place] - A DEPT OF [facility] [phone]  and follow the prompts.  Office hours are 8:00 a.m. to 4:30 p.m. Monday - Friday. Please note that voicemails left after 4:00 p.m. may not be returned until the following business day.  We are closed weekends  and major holidays. You have access to a nurse at all times for urgent questions. Please call the main number to the clinic [phone] and follow the prompts.  For any non-urgent questions, you may also contact your provider using MyChart. We now offer e-Visits for anyone 38 and older to request care online for non-urgent symptoms. For details visit [email or web address].   Also download the MyChart app! Go to the app store, search "MyChart", open the app, select [place], and log in with your MyChart username and password.

## 2024-08-08 ENCOUNTER — Telehealth: Payer: Self-pay | Admitting: Family Medicine

## 2024-08-08 NOTE — Telephone Encounter (Signed)
 Left message informing pt to contact the office that draw these labs to discuss or call us  to schedule a visit so we can perform labs of our own if needed. LS

## 2024-08-08 NOTE — Telephone Encounter (Signed)
 Copied from CRM #[id number]. Topic: Clinical - Lab/Test Results >> Aug 08, 2024 10:35 AM DeAngela L wrote: Reason for CRM: the patient is calling to ask Dr Matthews office to please get in contact with [facility] hospital cancer department, done most recently last Wednesday about her lab results and speak with the person who ordered the lab results This is about the patient creatinin being high on the lab work   Phone num [phone] Elizabeth [name]   Pt num [phone] (H)

## 2024-08-08 NOTE — Telephone Encounter (Signed)
 duplicate

## 2024-08-08 NOTE — Telephone Encounter (Signed)
 Copied from CRM #[id number]. Topic: Clinical - Lab/Test Results >> Aug 08, 2024  9:43 AM Elizabeth [name] wrote: Reason for CRM: Patient received a letter from some company stating that her creatine is really high and that she is having some kidney issues. Patient is requesting a callback [phone] regarding results that are on letter.

## 2024-08-19 ENCOUNTER — Ambulatory Visit: Payer: Self-pay

## 2024-08-19 NOTE — Telephone Encounter (Signed)
 Copied from CRM #[id number]. Topic: Clinical - Red Word Triage >> Aug 19, 2024  1:43 PM Montie POUR wrote: Red Word that prompted transfer to Nurse Triage:  She has been constipated for 3-4 weeks. Stomach pain for 3-4 hours at a time. She called Gastro doctor and they did not call back. >> Aug 19, 2024  4:27 PM Susanna ORN wrote: Patient calling back again to speak with triage nurse. States no one called her back. Patient states she can't continue to wait on the line after holding earlier for 30 plus minutes. Would like for a triage nurse to contact her.  >> Aug 19, 2024  1:56 PM Montie POUR wrote: Nafisah hung up and I called her back. She would like the triage nurse to call her instead of holding for so long. Her number is [phone]  Patient hung up the phone before transfer to Nurse Triage. This RN placed a call to patient for triage. No answer-voicemail left to call back to Nurse Triage. Will place in call backs.

## 2024-08-19 NOTE — Telephone Encounter (Signed)
 FYI Only or Action Required?: Action required by provider: update on patient condition.  Patient was last seen in primary care on 07/06/2024 by Zollie Lowers, MD.  Called Nurse Triage reporting Abdominal Pain.  Symptoms began several months ago.  Symptoms are: unchanged.  Triage Disposition: See PCP Within 2 Weeks  Patient/caregiver understands and will follow disposition?: No, refuses disposition     Reason for Disposition  Abdominal pain is a chronic symptom (recurrent or ongoing AND present > 4 weeks)  Answer Assessment - Initial Assessment Questions This RN spoke with pt and pt's husband. Pt husband refuses appointment, stating pt is scheduled in Feb. This RN advised pt to reach out to GI. This RN educated pt on new-worsening symptoms and when to call back/seek emergent care. Pt verbalized understanding and agrees to plan.  Pt husband denies any new/worsening symptoms.    Bms have been large and painful hard to push for about 2 months Intermittent vomiting, once a week (light brown color) Last bm: yesterday morning Passing very little gas Pt husband denies constipation except when pt eats meat/cheeses Pt states she had a colonoscopy with [facility] 3 months ago GI; pt states she tried calling them today but they haven't called back; pt states she has been taking iron for 3 weeks Stomach pain for months Lower stomach every morning, 8/10 when it happens, no pain currently  Denies: Blood in bm, black/tarry stool, swollen stomach, fever, yellowing of eyes  Protocols used: Abdominal Pain - [facility]

## 2024-08-22 NOTE — Telephone Encounter (Signed)
 Noted

## 2024-08-30 ENCOUNTER — Other Ambulatory Visit: Payer: Self-pay | Admitting: Family Medicine

## 2024-08-30 DIAGNOSIS — M546 Pain in thoracic spine: Secondary | ICD-10-CM

## 2024-09-05 ENCOUNTER — Encounter: Payer: Self-pay | Admitting: *Deleted

## 2024-09-06 ENCOUNTER — Telehealth: Payer: Self-pay | Admitting: Family Medicine

## 2024-09-06 ENCOUNTER — Telehealth: Admitting: Nurse Practitioner

## 2024-09-06 ENCOUNTER — Encounter: Payer: Self-pay | Admitting: Nurse Practitioner

## 2024-09-06 DIAGNOSIS — J111 Influenza due to unidentified influenza virus with other respiratory manifestations: Secondary | ICD-10-CM | POA: Diagnosis not present

## 2024-09-06 MED ORDER — OSELTAMIVIR PHOSPHATE 75 MG PO CAPS: 75.0000 mg | ORAL_CAPSULE | Freq: Two times a day (BID) | ORAL | 0 refills | Status: AC

## 2024-09-06 NOTE — Telephone Encounter (Signed)
 Called and spoke with Elizabeth [name] and got patient scheduled for a video visit today with DOD.

## 2024-09-06 NOTE — Progress Notes (Signed)
 "   Virtual Visit Consent   Elizabeth [name], you are scheduled for a virtual visit with Elizabeth Gladis, FNP, a [facility] provider, today.     Just as with appointments in the office, your consent must be obtained to participate.  Your consent will be active for this visit and any virtual visit you may have with one of our providers in the next 365 days.     If you have a MyChart account, a copy of this consent can be sent to you electronically.  All virtual visits are billed to your insurance company just like a traditional visit in the office.    As this is a virtual visit, video technology does not allow for your provider to perform a traditional examination.  This may limit your provider's ability to fully assess your condition.  If your provider identifies any concerns that need to be evaluated in person or the need to arrange testing (such as labs, EKG, etc.), we will make arrangements to do so.     Although advances in technology are sophisticated, we cannot ensure that it will always work on either your end or our end.  If the connection with a video visit is poor, the visit may have to be switched to a telephone visit.  With either a video or telephone visit, we are not always able to ensure that we have a secure connection.     I need to obtain your verbal consent now.   Are you willing to proceed with your visit today? YES   Elizabeth [name] has provided verbal consent on 09/06/2024 for a virtual visit (video or telephone).   [name], FNP   Date: 09/06/2024 3:21 PM   Virtual Visit via Video Note   I, Elizabeth [name], connected with Elizabeth [name] ([id number], 04/23/1941) on 09/06/2024 at  6:15 PM EST by a video-enabled telemedicine application and verified that I am speaking with the correct person using two identifiers.  Location: Patient: Virtual Visit Location Patient: Home Provider: Virtual Visit Location Provider: Mobile   I discussed the limitations of  evaluation and management by telemedicine and the availability of in person appointments. The patient expressed understanding and agreed to proceed.    History of Present Illness: Elizabeth [name] is a 83 y.o. who identifies as a female who was assigned female at birth, and is being seen today for cough.  HPI: Cough This is a new problem. The current episode started in the past 7 days. The problem has been waxing and waning. Associated symptoms include myalgias, nasal congestion, rhinorrhea and a sore throat. Pertinent negatives include no chills, ear congestion or fever. She has tried nothing for the symptoms. The treatment provided mild relief.    Review of Systems  Constitutional:  Negative for chills and fever.  HENT:  Positive for rhinorrhea and sore throat.   Respiratory:  Positive for cough.   Musculoskeletal:  Positive for myalgias.    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) 06/18/2022   Need for immunization against influenza 06/10/2022   Encounter for vitamin deficiency screening 06/10/2022   Constipation 05/27/2022   Drooling 02/07/2022   Hemiparesis affecting left side as late effect of cerebrovascular accident [facility]) 01/30/2022   Dysphagia 01/30/2022   Dizziness 01/29/2022   Fall 12/27/2021  Abnormal liver ultrasound 12/26/2021   Allergic sinusitis 12/23/2021   Right-sided lacunar stroke (HCC) 11/19/2021   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   History of iron deficiency anemia 12/22/2019   IDA (iron deficiency anemia) 07/04/2019   Hepatic fibrosis 07/04/2019   Hyperlipidemia associated with type 2 diabetes mellitus (HCC) 03/25/2019   Rectal bleeding    Esophageal dysphagia 07/28/2018   Anxiety and depression 10/21/2017   Abdominal aortic atherosclerosis 10/21/2017   Type 2 diabetes  mellitus with hyperlipidemia (HCC) 09/06/2015   Type 2 diabetes mellitus with peripheral neuropathy (HCC) 11/27/2014   Cerebral aneurysm 02/27/2012   Tobacco abuse 02/27/2012   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 02/27/2012   Gait instability 02/27/2012   Vision disturbance following cerebrovascular accident 02/27/2012   Dyspnea 11/14/2011   Thyroid  disease 10/12/2008   Diabetes (HCC) 08/29/2008   Obesity, unspecified 08/29/2008   COPD (chronic obstructive pulmonary disease) (HCC) 08/29/2008   WEIGHT LOSS 08/29/2008   Hypertension associated with diabetes (HCC) 08/28/2008   ASTHMA 08/28/2008   ESOPHAGEAL STRICTURE 08/28/2008   GERD (gastroesophageal reflux disease) 08/28/2008   HIATAL HERNIA 08/28/2008   Diverticulosis of colon 08/28/2008    Allergies: Allergies[1] Medications: Current Medications[2]  Observations/Objective: Patient is well-developed, well-nourished in no acute distress.  Resting comfortably  at home.  Head is normocephalic, atraumatic.  No labored breathing.  Speech is clear and coherent with logical content.  Patient is alert and oriented at baseline.  Speech slurred- normal for her Deep cough  Assessment and Plan:  Elizabeth [name] in today with chief complaint of No chief complaint on file.   1. Influenza-like illness (Primary) 1. Take meds as prescribed 2. Use a cool mist humidifier especially during the winter months and when heat has been humid. 3. Use saline nose sprays frequently 4. Saline irrigations of the nose can be very helpful if done frequently.  * 4X daily for 1 week*  * Use of a nettie pot can be helpful with this. Follow directions with this* 5. Drink plenty of fluids 6. Keep thermostat turn down low 7.For any cough or congestion- mucinex  if needed 8. For fever or aces or pains- take tylenol  or ibuprofen  appropriate for age and weight.  * for fevers greater than 101 orally you may alternate ibuprofen  and tylenol  every  3 hours.   Meds ordered  this encounter  Medications   oseltamivir  (TAMIFLU ) 75 MG capsule    Sig: Take 1 capsule (75 mg total) by mouth 2 (two) times daily.    Dispense:  10 capsule    Refill:  0    Supervising Provider:   MARYANNE CHEW A [[id number]]      Follow Up Instructions: I discussed the assessment and treatment plan with the patient. The patient was provided an opportunity to ask questions and all were answered. The patient agreed with the plan and demonstrated an understanding of the instructions.  A copy of instructions were sent to the patient via MyChart.  The patient was advised to call back or seek an in-person evaluation if the symptoms worsen or if the condition fails to improve as anticipated.  Time:  I spent 15 minutes with the patient via telehealth technology discussing the above problems/concerns.    Hildur-Margaret Gladis, FNP     [1]  Allergies Allergen Reactions   Banana Anaphylaxis   Codeine Nausea And Vomiting and Other (See Comments)    PROJECTILE VOMITING   Stevia [Stevioside] Swelling and Other (See Comments)    Face, tongue,  and eye swelling    Banana Flavoring Agent (Non-Screening) Other (See Comments)    Unknown   Lipitor [Atorvastatin ] Other (See Comments)    Joint pain   Statins Other (See Comments)    Feels bad, leg cramps, worries about liver effects.   Vicodin [Hydrocodone-Acetaminophen ] Nausea And Vomiting   Zocor [Simvastatin] Other (See Comments)    Made liver function incorrectly   Ace Inhibitors Cough   Actos [Pioglitazone] Swelling   Benicar [Olmesartan Medoxomil] Other (See Comments)    Feel bad   Celebrex [Celecoxib] Rash   Chantix [Varenicline] Other (See Comments)    Worked opposite.   Diovan [Valsartan] Other (See Comments)    Feel bad   Latex Rash    Red rash   Metformin  And Related Other (See Comments)    GI upset    Mushroom Extract Complex (Obsolete) Palpitations   Penicillins Rash        Rice Other (See Comments)    Gives  patient indigestion   Spinach Other (See Comments)    Stomach problems   Sulfonamide Derivatives Rash  [2]  Current Outpatient Medications:    Accu-Chek Softclix Lancets lancets, Test BS in the morning, at noon and at bedtime Dx E11.69, Disp: 300 each, Rfl: 3   albuterol  (VENTOLIN  HFA) 108 (90 Base) MCG/ACT inhaler, INHALE TWO PUFFS BY MOUTH EVERY 6 HOURS AS NEEDED FOR wheezing OR SHORTNESS OF BREATH, Disp: 8.5 g, Rfl: 2   azelastine  (ASTELIN ) 0.1 % nasal spray, Place 2 sprays into both nostrils 2 (two) times daily. Use in each nostril as directed, Disp: 60 mL, Rfl: 3   Blood Glucose Monitoring Suppl DEVI, 1 each by Does not apply route in the morning, at noon, and at bedtime. May substitute to any manufacturer covered by patient's insurance., Disp: 1 each, Rfl: 0   chlorpheniramine (ALLERGY RELIEF) 4 MG tablet, Take 4 mg by mouth 2 (two) times daily as needed for allergies., Disp: , Rfl:    clopidogrel  (PLAVIX ) 75 MG tablet, Take 1 tablet (75 mg total) by mouth daily., Disp: 90 tablet, Rfl: 3   cyclobenzaprine  (FLEXERIL ) 5 MG tablet, TAKE 1 TABLET BY MOUTH AT BEDTIME, Disp: 30 tablet, Rfl: 0   ezetimibe  (ZETIA ) 10 MG tablet, TAKE 1 TABLET BY MOUTH DAILY, Disp: 90 tablet, Rfl: 3   ferrous sulfate  324 MG TBEC, Take 324 mg by mouth 2 (two) times daily. Per Mitzie Boettcher, NP start as of 07/12/2024., Disp: , Rfl:    Glucose Blood (BLOOD GLUCOSE TEST STRIPS) STRP, 1 each by In Vitro route in the morning, at noon, and at bedtime. May substitute to any manufacturer covered by patient's insurance., Disp: 100 strip, Rfl: 11   Grape Seed OIL, Take 1 tablet by mouth daily at 6 (six) AM., Disp: , Rfl:    hydrochlorothiazide  (HYDRODIURIL ) 12.5 MG tablet, TAKE 1 TABLET BY MOUTH DAILY, Disp: 90 tablet, Rfl: 3   hydrocortisone  (ANUSOL -HC) 2.5 % rectal cream, Place 1 Application rectally 3 (three) times daily., Disp: 56 g, Rfl: 1   Lancets Misc. (ACCU-CHEK SOFTCLIX LANCET DEV) KIT, Test BS in the morning, at noon  and at bedtime Dx E11.69, Disp: 1 kit, Rfl: 0   montelukast  (SINGULAIR ) 10 MG tablet, TAKE 1 TABLET BY MOUTH AT BEDTIME, Disp: 30 tablet, Rfl: 3   ondansetron  (ZOFRAN ) 4 MG tablet, Take 1 tablet (4 mg total) by mouth every 6 (six) hours., Disp: 60 tablet, Rfl: 1   OVER THE COUNTER MEDICATION, Take 1 capsule by mouth  daily at 6 (six) AM. Brain essense, Disp: , Rfl:    pantoprazole  (PROTONIX ) 40 MG tablet, TAKE 1 TABLET BY MOUTH DAILY BEFORE BREAKFAST, Disp: 90 tablet, Rfl: 1   REPATHA  SURECLICK 140 MG/ML SOAJ, INJECT 1 ML into THE SKIN EVERY 14 DAYS, Disp: 6 mL, Rfl: 3   Specialty Vitamins Products (COLLAGEN ULTRA PO), Take 60 mg by mouth 1 day or 1 dose. Collagen peptides, Disp: , Rfl:    UNABLE TO FIND, Take 1 capsule by mouth 2 (two) times daily. Cinnamon, Chromium & Biotin, Disp: , Rfl:    UNABLE TO FIND, Take 0.5 tablets by mouth daily. Curamin 1/2 tablet for pain, Disp: , Rfl:    ursodiol  (ACTIGALL ) 300 MG capsule, TAKE ONE CAPSULE BY MOUTH THREE TIMES DAILY, Disp: 270 capsule, Rfl: 3   Vitamin D , Ergocalciferol , (DRISDOL ) 1.25 MG (50000 UNIT) CAPS capsule, TAKE ONE CAPSULE BY MOUTH EVERY 7 DAYS, Disp: 10 capsule, Rfl: 2  "

## 2024-09-06 NOTE — Telephone Encounter (Unsigned)
 Copied from CRM [id number]. Topic: Clinical - Medical Advice >> Sep 06, 2024  1:32 PM Sophia H wrote: Reason for CRM: Patient states she has been having cough/congestion since Christmas and is wanting to know if PCP will send something to the pharmacy for her. Soonest appt is Friday January 2nd - patient declined scheduling. Please advise # [phone]   [facility] - [place], [place] - [address] >> Sep 06, 2024  8:54 PM Carrielelia G wrote: Patient called back. She was informed that the message was sent out from when she first called.  I did offer her to speak with NT she declined and said she just wanted to speak with her provider.

## 2024-09-14 ENCOUNTER — Inpatient Hospital Stay: Attending: Oncology

## 2024-09-14 DIAGNOSIS — K5909 Other constipation: Secondary | ICD-10-CM | POA: Insufficient documentation

## 2024-09-14 DIAGNOSIS — D500 Iron deficiency anemia secondary to blood loss (chronic): Secondary | ICD-10-CM | POA: Insufficient documentation

## 2024-09-14 DIAGNOSIS — K649 Unspecified hemorrhoids: Secondary | ICD-10-CM | POA: Diagnosis not present

## 2024-09-14 LAB — CBC WITH DIFFERENTIAL/PLATELET
Abs Immature Granulocytes: 0.01 K/uL (ref 0.00–0.07)
Basophils Absolute: 0.1 K/uL (ref 0.0–0.1)
Basophils Relative: 1 %
Eosinophils Absolute: 0.2 K/uL (ref 0.0–0.5)
Eosinophils Relative: 3 %
HCT: 48.2 % — ABNORMAL HIGH (ref 36.0–46.0)
Hemoglobin: 15.8 g/dL — ABNORMAL HIGH (ref 12.0–15.0)
Immature Granulocytes: 0 %
Lymphocytes Relative: 17 %
Lymphs Abs: 1.1 K/uL (ref 0.7–4.0)
MCH: 26.4 pg (ref 26.0–34.0)
MCHC: 32.8 g/dL (ref 30.0–36.0)
MCV: 80.6 fL (ref 80.0–100.0)
Monocytes Absolute: 0.3 K/uL (ref 0.1–1.0)
Monocytes Relative: 5 %
Neutro Abs: 4.8 K/uL (ref 1.7–7.7)
Neutrophils Relative %: 74 %
Platelets: 263 K/uL (ref 150–400)
RBC: 5.98 MIL/uL — ABNORMAL HIGH (ref 3.87–5.11)
RDW: 24 % — ABNORMAL HIGH (ref 11.5–15.5)
WBC: 6.6 K/uL (ref 4.0–10.5)
nRBC: 0 % (ref 0.0–0.2)

## 2024-09-14 LAB — IRON AND TIBC
Iron: 69 ug/dL (ref 28–170)
Saturation Ratios: 20 % (ref 10.4–31.8)
TIBC: 343 ug/dL (ref 250–450)
UIBC: 274 ug/dL

## 2024-09-14 LAB — FOLATE: Folate: 17 ng/mL

## 2024-09-14 LAB — FERRITIN: Ferritin: 258 ng/mL (ref 11–307)

## 2024-09-14 LAB — VITAMIN B12: Vitamin B-12: 780 pg/mL (ref 180–914)

## 2024-09-15 LAB — COPPER, SERUM: Copper: 113 ug/dL (ref 80–158)

## 2024-09-16 LAB — METHYLMALONIC ACID, SERUM: Methylmalonic Acid, Quantitative: 185 nmol/L (ref 0–378)

## 2024-09-21 ENCOUNTER — Inpatient Hospital Stay: Admitting: Oncology

## 2024-09-21 VITALS — BP 131/72 | HR 94 | Temp 98.1°F | Resp 18

## 2024-09-21 DIAGNOSIS — D500 Iron deficiency anemia secondary to blood loss (chronic): Secondary | ICD-10-CM | POA: Diagnosis not present

## 2024-09-21 DIAGNOSIS — D508 Other iron deficiency anemias: Secondary | ICD-10-CM | POA: Diagnosis not present

## 2024-09-21 NOTE — Progress Notes (Signed)
 " [facility]  HEMATOLOGY NEW VISIT  Elizabeth Lowers, MD  REASON FOR REFERRAL: IDA  SUMMARY OF HEMATOLOGIC HISTORY: Patient is an 84 year old female with past medical history significant for stroke with residual left-sided weakness, diabetes, primary biliary cholangitis, constipation, hemorrhoids, asthma, depression, hypertension, hypothyroidism and iron deficiency anemia.  Patient was referred to the cancer center for iron deficiency with low iron saturations and a ferritin less than 7.  Hemoglobin was also fairly consistently low since 2020.In August, patient was instructed to go to the emergency room for hemoglobin of 6.3 where she received a unit of PRBC.  Platelets have been more or less stable with occasional elevation likely secondary to iron deficiency.  MCV 68.  Reports occasional bleeding when she has bowel movements.  She is currently on iron supplements twice per day.  Colonoscopy from 05/10/24 showed single nonbleeding colonic angiodysplastic lesion treated with argon plasma coagulation.  Eighteen 2 to 10 mm polyps in transverse colon and a 4 mm polyp in descending colon.  Diverticulosis in sigmoid colon.  Pathology showed tubular adenoma, inflammation but negative for dysplasia.  EGD showed 1 cm hiatal hernia, patchy mildly erythematous mucosa without bleeding in gastric antrum.  Patient has received intermittent iron (510 mg IV Feraheme ) last given on 07/27/2024 and 08/03/2024.   She denies any recent GI bleeding, melena or hematochezia.  Reports chronic constipation with straining and hemorrhoidal bleeding.  No recent hospitalizations, surgeries or changes to baseline health.  HISTORY OF PRESENT ILLNESS: Elizabeth [name] 84 y.o. female referred for IDA. The patient is accompanied by her husband.  She denies any recent hospitalizations, surgeries or changes to baseline health.  Reports occasional hemorrhoidal bleeding due to constipation and  straining.  Has been reports he is trying to give her less stool softeners as he wants her body to regulate and not take so much medication.  Reports constipation once every 1 to 2 weeks.  He is trying to incorporate more fruits and vegetables help with her stools.  Appetite and energy levels are 50%.  She just recently recovered from a sinus infection requiring antibiotics that were prescribed by her PCP.  She is feeling better.  She is also taking vitamin D  supplements.   I have reviewed the past medical history, past surgical history, social history and family history with the patient   ALLERGIES:  is allergic to banana, codeine, stevia [stevioside], banana flavoring agent (non-screening), lipitor [atorvastatin ], statins, vicodin [hydrocodone-acetaminophen ], zocor [simvastatin], ace inhibitors, actos [pioglitazone], benicar [olmesartan medoxomil], celebrex [celecoxib], chantix [varenicline], diovan [valsartan], latex, metformin  and related, mushroom extract complex (obsolete), penicillins, rice, spinach, and sulfonamide derivatives.  MEDICATIONS:  Current Outpatient Medications  Medication Sig Dispense Refill   Accu-Chek Softclix Lancets lancets Test BS in the morning, at noon and at bedtime Dx E11.69 300 each 3   albuterol  (VENTOLIN  HFA) 108 (90 Base) MCG/ACT inhaler INHALE TWO PUFFS BY MOUTH EVERY 6 HOURS AS NEEDED FOR wheezing OR SHORTNESS OF BREATH 8.5 g 2   azelastine  (ASTELIN ) 0.1 % nasal spray Place 2 sprays into both nostrils 2 (two) times daily. Use in each nostril as directed 60 mL 3   Blood Glucose Monitoring Suppl DEVI 1 each by Does not apply route in the morning, at noon, and at bedtime. May substitute to any manufacturer covered by patient's insurance. 1 each 0   chlorpheniramine (ALLERGY RELIEF) 4 MG tablet Take 4 mg by mouth 2 (two) times daily as needed for allergies.  clopidogrel  (PLAVIX ) 75 MG tablet Take 1 tablet (75 mg total) by mouth daily. 90 tablet 3   cyclobenzaprine   (FLEXERIL ) 5 MG tablet TAKE 1 TABLET BY MOUTH AT BEDTIME 30 tablet 0   ezetimibe  (ZETIA ) 10 MG tablet TAKE 1 TABLET BY MOUTH DAILY 90 tablet 3   ferrous sulfate  324 MG TBEC Take 324 mg by mouth 2 (two) times daily. Per Mitzie Boettcher, NP start as of 07/12/2024.     Glucose Blood (BLOOD GLUCOSE TEST STRIPS) STRP 1 each by In Vitro route in the morning, at noon, and at bedtime. May substitute to any manufacturer covered by patient's insurance. 100 strip 11   Grape Seed OIL Take 1 tablet by mouth daily at 6 (six) AM.     hydrochlorothiazide  (HYDRODIURIL ) 12.5 MG tablet TAKE 1 TABLET BY MOUTH DAILY 90 tablet 3   hydrocortisone  (ANUSOL -HC) 2.5 % rectal cream Place 1 Application rectally 3 (three) times daily. 56 g 1   Lancets Misc. (ACCU-CHEK SOFTCLIX LANCET DEV) KIT Test BS in the morning, at noon and at bedtime Dx E11.69 1 kit 0   montelukast  (SINGULAIR ) 10 MG tablet TAKE 1 TABLET BY MOUTH AT BEDTIME 30 tablet 3   ondansetron  (ZOFRAN ) 4 MG tablet Take 1 tablet (4 mg total) by mouth every 6 (six) hours. 60 tablet 1   oseltamivir  (TAMIFLU ) 75 MG capsule Take 1 capsule (75 mg total) by mouth 2 (two) times daily. 10 capsule 0   OVER THE COUNTER MEDICATION Take 1 capsule by mouth daily at 6 (six) AM. Brain essense     pantoprazole  (PROTONIX ) 40 MG tablet TAKE 1 TABLET BY MOUTH DAILY BEFORE BREAKFAST 90 tablet 1   REPATHA  SURECLICK 140 MG/ML SOAJ INJECT 1 ML into THE SKIN EVERY 14 DAYS 6 mL 3   Specialty Vitamins Products (COLLAGEN ULTRA PO) Take 60 mg by mouth 1 day or 1 dose. Collagen peptides     UNABLE TO FIND Take 1 capsule by mouth 2 (two) times daily. Cinnamon, Chromium & Biotin     UNABLE TO FIND Take 0.5 tablets by mouth daily. Curamin 1/2 tablet for pain     ursodiol  (ACTIGALL ) 300 MG capsule TAKE ONE CAPSULE BY MOUTH THREE TIMES DAILY 270 capsule 3   Vitamin D , Ergocalciferol , (DRISDOL ) 1.25 MG (50000 UNIT) CAPS capsule TAKE ONE CAPSULE BY MOUTH EVERY 7 DAYS 10 capsule 2   No current  facility-administered medications for this visit.     REVIEW OF SYSTEMS:   Review of Systems  Constitutional:  Positive for malaise/fatigue.  Gastrointestinal:  Positive for blood in stool and constipation.  Neurological:  Negative for dizziness, tingling and headaches.  Psychiatric/Behavioral:  The patient has insomnia.      PHYSICAL EXAMINATION:   Vitals:   09/21/24 0942 09/21/24 0947  BP: (!) 118/92 131/72  Pulse: 94   Resp: 18   Temp: 98.1 F (36.7 C)   SpO2: 97%     GENERAL:alert, no distress and comfortable SKIN: skin color, texture, turgor are normal, no rashes or significant lesions EYES: normal, Conjunctiva are pink and non-injected, sclera clear OROPHARYNX:no exudate, no erythema and lips, buccal mucosa, and tongue normal  NECK: supple, thyroid  normal size, non-tender, without nodularity LYMPH:  no palpable lymphadenopathy in the cervical, axillary or inguinal LUNGS: clear to auscultation and percussion with normal breathing effort HEART: regular rate & rhythm and no murmurs and no lower extremity edema ABDOMEN:abdomen soft, non-tender and normal bowel sounds Musculoskeletal:no cyanosis of digits and no clubbing  NEURO:  alert & oriented x 3 with some garbled speech, Left sided weakness.   LABORATORY DATA:  I have reviewed the data as listed  Lab Results  Component Value Date   WBC 6.6 09/14/2024   NEUTROABS 4.8 09/14/2024   HGB 15.8 (H) 09/14/2024   HCT 48.2 (H) 09/14/2024   MCV 80.6 09/14/2024   PLT 263 09/14/2024    No results found for: SPEP, UPEP     Chemistry      Component Value Date/Time   NA 135 04/20/2024 1503   NA 135 03/02/2024 0908   K 3.3 (L) 04/20/2024 1503   CL 98 04/20/2024 1503   CO2 23 04/20/2024 1503   BUN 11 04/20/2024 1503   BUN 10 03/02/2024 0908   CREATININE 0.72 04/20/2024 1503   CREATININE 0.71 04/18/2024 1215      Component Value Date/Time   CALCIUM  9.8 04/20/2024 1503   ALKPHOS 103 04/20/2024 1503   AST 30  07/06/2024 1139   ALT 21 07/06/2024 1139   BILITOT 0.7 04/20/2024 1503   BILITOT 0.4 03/02/2024 0908       RADIOGRAPHIC STUDIES: I have personally reviewed the radiological images as listed and agreed with the findings in the report. US  ELASTOGRAPHY LIVER CLINICAL DATA:  History of primary biliary cholangitis  EXAM: US  ELASTOGRAPHY HEPATIC  TECHNIQUE: Sonography of the liver was performed. In addition, ultrasound elastography evaluation of the liver was performed. A region of interest was placed within the right lobe of the liver. Following application of a compressive sonographic pulse, tissue compressibility was assessed. Multiple assessments were performed at the selected site. Median tissue compressibility was determined. Previously, hepatic stiffness was assessed by shear wave velocity. Based on recently published Society of Radiologists in Ultrasound consensus article, reporting is now recommended to be performed in the SI units of pressure (kiloPascals) representing hepatic stiffness/elasticity. The obtained result is compared to the published reference standards. (cACLD = compensated Advanced Chronic Liver Disease)  COMPARISON:  Ultrasound elastography dated 01/13/2022  FINDINGS: Liver: Nodular hepatic contour. No focal hepatic lesions. Portal vein is patent on color Doppler imaging with normal direction of blood flow towards the liver.  Partially imaged right renal cyst.  ULTRASOUND HEPATIC ELASTOGRAPHY  Device: Siemens Helix VTQ  Patient position: Oblique  Transducer: DAX  Number of measurements: 13  Hepatic segment:  8  Median kPa: 3.8  IQR: 3.5  IQR/Median kPa ratio: 0.92  Data quality: IQR/Median kPa ratio of 0.6 or greater when using the DAX probe indicates reduced accuracy  Diagnostic category:  < or = 5 kPa: high probability of being normal  The use of hepatic elastography is applicable to patients with viral hepatitis and non-alcoholic  fatty liver disease. At this time, there is insufficient data for the referenced cut-off values and use in other causes of liver disease, including alcoholic liver disease. Patients, however, may be assessed by elastography and serve as their own reference standard/baseline.  In patients with non-alcoholic liver disease, the values suggesting compensated advanced chronic liver disease (cACLD) may be lower, and patients may need additional testing with elasticity results of 7-9 kPa.  Please note that abnormal hepatic elasticity and shear wave velocities may also be identified in clinical settings other than with hepatic fibrosis, such as: acute hepatitis, elevated right heart and central venous pressures including use of beta blockers, veno-occlusive disease (Budd-Chiari), infiltrative processes such as mastocytosis/amyloidosis/infiltrative tumor/lymphoma, extrahepatic cholestasis, with hyperemia in the post-prandial state, and with liver transplantation. Correlation with patient history, laboratory data, and  clinical condition recommended.  Diagnostic Categories:  < or =5 kPa: high probability of being normal  < or =9 kPa: in the absence of other known clinical signs, rules out cACLD  >9 kPa and ?13 kPa: suggestive of cACLD, but needs further testing  >13 kPa: highly suggestive of cACLD  > or =17 kPa: highly suggestive of cACLD with an increased probability of clinically significant portal hypertension  IMPRESSION: ULTRASOUND LIVER:  Nodular hepatic contour, which can be seen in the setting of cirrhosis.  ULTRASOUND HEPATIC ELASTOGRAPHY:  Median kPa: 3.8 noting suboptimal data quality resulting in reduced accuracy  Diagnostic category:  < or = 5 kPa: high probability of being normal  In the setting of elevated liver function tests, non-fasting state, or vascular congestion, the stage of liver fibrosis may be overestimated. In some patients with NAFLD, the cut-off  values for cACLD may be lower (7-9 kPa). In causes other than viral hepatitis and NAFLD, the cut-off values are not well established.  Electronically Signed   By: Limin  Xu M.D.   On: 05/17/2024 17:00   ASSESSMENT & PLAN:  Patient is a 84 y.o. female referred for iron deficiency anemia.  Assessment & Plan Other iron deficiency anemia The most likely cause of her anemia is due to chronic blood loss.  Lab Results  Component Value Date   IRON 69 09/14/2024   TIBC 343 09/14/2024   FERRITIN 258 09/14/2024    Last colonoscopy/endoscopy: 05/10/24.   -She received 2 doses of IV Feraheme  given on 07/27/2024 and 08/03/2024 with great tolerance. -Most recent labs show improvement of hemoglobin and iron levels. -No additional IV iron needed at this time. -Continue oral iron but may reduce to every 3rd or 4th day based on stool consistency.  Use MiraLAX for constipation.  Return to clinic in 12 weeks with labs to assess response to IV iron    Orders Placed This Encounter  Procedures   Ferritin    Standing Status:   Future    Expected Date:   12/20/2024    Expiration Date:   03/20/2025   CBC with Differential/Platelet    Standing Status:   Future    Expected Date:   12/20/2024    Expiration Date:   03/20/2025   Vitamin B12    Standing Status:   Future    Expected Date:   12/20/2024    Expiration Date:   03/20/2025   Methylmalonic acid, serum    Standing Status:   Future    Expected Date:   12/20/2024    Expiration Date:   03/20/2025   Iron and TIBC    Standing Status:   Future    Expected Date:   12/20/2024    Expiration Date:   03/20/2025    The total time spent in the appointment was 40 minutes encounter with patients including review of chart and various tests results, discussions about plan of care and coordination of care plan   All questions were answered. The patient knows to call the clinic with any problems, questions or concerns. No barriers to learning was  detected.   Elizabeth FORBES Hope, NP 1/14/202610:03 AM  "

## 2024-09-21 NOTE — Assessment & Plan Note (Addendum)
 The most likely cause of her anemia is due to chronic blood loss.  Lab Results  Component Value Date   IRON 69 09/14/2024   TIBC 343 09/14/2024   FERRITIN 258 09/14/2024    Last colonoscopy/endoscopy: 05/10/24.   -She received 2 doses of IV Feraheme  given on 07/27/2024 and 08/03/2024 with great tolerance. -Most recent labs show improvement of hemoglobin and iron levels. -No additional IV iron needed at this time. -Continue oral iron but may reduce to every 3rd or 4th day based on stool consistency.  Use MiraLAX for constipation.  Return to clinic in 12 weeks with labs to assess response to IV iron

## 2024-09-28 ENCOUNTER — Other Ambulatory Visit: Payer: Self-pay | Admitting: Family Medicine

## 2024-09-28 DIAGNOSIS — K219 Gastro-esophageal reflux disease without esophagitis: Secondary | ICD-10-CM

## 2024-10-03 ENCOUNTER — Other Ambulatory Visit: Payer: Self-pay | Admitting: *Deleted

## 2024-10-03 DIAGNOSIS — K219 Gastro-esophageal reflux disease without esophagitis: Secondary | ICD-10-CM

## 2024-10-03 MED ORDER — PANTOPRAZOLE SODIUM 40 MG PO TBEC: 40.0000 mg | DELAYED_RELEASE_TABLET | Freq: Every day | ORAL | 0 refills | Status: AC

## 2024-10-04 ENCOUNTER — Ambulatory Visit (INDEPENDENT_AMBULATORY_CARE_PROVIDER_SITE_OTHER): Admitting: Gastroenterology

## 2024-10-10 ENCOUNTER — Ambulatory Visit: Admitting: Family Medicine

## 2024-10-11 ENCOUNTER — Ambulatory Visit: Admitting: Cardiology

## 2024-11-02 ENCOUNTER — Ambulatory Visit: Admitting: Family Medicine

## 2024-12-14 ENCOUNTER — Inpatient Hospital Stay

## 2024-12-21 ENCOUNTER — Inpatient Hospital Stay: Admitting: Oncology

## 2025-01-03 ENCOUNTER — Ambulatory Visit: Admitting: Cardiology

## 2025-06-01 ENCOUNTER — Ambulatory Visit: Payer: Self-pay
